# Patient Record
Sex: Male | Born: 1950 | Race: White | Hispanic: No | Marital: Married | Smoking: Current every day smoker
Health system: Southern US, Community
[De-identification: ages and names within clinical notes are randomized; demographics above are authoritative.]

## PROBLEM LIST (undated history)

## (undated) DIAGNOSIS — Z87442 Personal history of urinary calculi: Secondary | ICD-10-CM

## (undated) DIAGNOSIS — Z720 Tobacco use: Secondary | ICD-10-CM

## (undated) DIAGNOSIS — F32A Depression, unspecified: Secondary | ICD-10-CM

## (undated) DIAGNOSIS — J45909 Unspecified asthma, uncomplicated: Secondary | ICD-10-CM

## (undated) DIAGNOSIS — E1140 Type 2 diabetes mellitus with diabetic neuropathy, unspecified: Secondary | ICD-10-CM

## (undated) DIAGNOSIS — C801 Malignant (primary) neoplasm, unspecified: Secondary | ICD-10-CM

## (undated) DIAGNOSIS — I671 Cerebral aneurysm, nonruptured: Secondary | ICD-10-CM

## (undated) DIAGNOSIS — I10 Essential (primary) hypertension: Secondary | ICD-10-CM

## (undated) DIAGNOSIS — E119 Type 2 diabetes mellitus without complications: Secondary | ICD-10-CM

## (undated) DIAGNOSIS — R251 Tremor, unspecified: Secondary | ICD-10-CM

## (undated) DIAGNOSIS — F329 Major depressive disorder, single episode, unspecified: Secondary | ICD-10-CM

## (undated) HISTORY — DX: Type 2 diabetes mellitus with diabetic neuropathy, unspecified: E11.40

## (undated) HISTORY — PX: ABDOMINAL SURGERY: SHX537

## (undated) HISTORY — DX: Tremor, unspecified: R25.1

## (undated) HISTORY — DX: Essential (primary) hypertension: I10

## (undated) HISTORY — DX: Malignant (primary) neoplasm, unspecified: C80.1

## (undated) HISTORY — DX: Tobacco use: Z72.0

---
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
## 2005-07-18 ENCOUNTER — Emergency Department: Payer: Self-pay | Admitting: Emergency Medicine

## 2007-11-16 ENCOUNTER — Emergency Department: Payer: Self-pay | Admitting: Emergency Medicine

## 2011-10-15 ENCOUNTER — Emergency Department: Payer: Self-pay | Admitting: Emergency Medicine

## 2011-10-15 LAB — CBC
HCT: 42.9 % (ref 40.0–52.0)
MCH: 31 pg (ref 26.0–34.0)
MCHC: 34.2 g/dL (ref 32.0–36.0)
MCV: 90 fL (ref 80–100)
RBC: 4.75 10*6/uL (ref 4.40–5.90)
WBC: 8.2 10*3/uL (ref 3.8–10.6)

## 2013-01-27 ENCOUNTER — Ambulatory Visit: Payer: Self-pay | Admitting: Family Medicine

## 2013-04-10 ENCOUNTER — Ambulatory Visit: Payer: Self-pay | Admitting: Family Medicine

## 2015-03-31 ENCOUNTER — Emergency Department
Admission: EM | Admit: 2015-03-31 | Discharge: 2015-03-31 | Disposition: A | Discharge status: Other Acute Inpatient Hospital | Payer: No Typology Code available for payment source | Attending: Emergency Medicine | Admitting: Emergency Medicine

## 2015-03-31 ENCOUNTER — Emergency Department: Payer: No Typology Code available for payment source

## 2015-03-31 ENCOUNTER — Other Ambulatory Visit: Payer: Self-pay

## 2015-03-31 ENCOUNTER — Encounter: Payer: Self-pay | Admitting: *Deleted

## 2015-03-31 DIAGNOSIS — I6200 Nontraumatic subdural hemorrhage, unspecified: Secondary | ICD-10-CM | POA: Diagnosis not present

## 2015-03-31 DIAGNOSIS — Z720 Tobacco use: Secondary | ICD-10-CM | POA: Insufficient documentation

## 2015-03-31 DIAGNOSIS — S065X9A Traumatic subdural hemorrhage with loss of consciousness of unspecified duration, initial encounter: Secondary | ICD-10-CM

## 2015-03-31 DIAGNOSIS — Z880 Allergy status to penicillin: Secondary | ICD-10-CM | POA: Diagnosis not present

## 2015-03-31 DIAGNOSIS — E119 Type 2 diabetes mellitus without complications: Secondary | ICD-10-CM | POA: Insufficient documentation

## 2015-03-31 DIAGNOSIS — R51 Headache: Secondary | ICD-10-CM | POA: Diagnosis present

## 2015-03-31 DIAGNOSIS — S065XAA Traumatic subdural hemorrhage with loss of consciousness status unknown, initial encounter: Secondary | ICD-10-CM

## 2015-03-31 HISTORY — DX: Type 2 diabetes mellitus without complications: E11.9

## 2015-03-31 LAB — CBC
HEMATOCRIT: 41.3 % (ref 40.0–52.0)
HEMOGLOBIN: 14.4 g/dL (ref 13.0–18.0)
MCH: 29.5 pg (ref 26.0–34.0)
MCHC: 34.8 g/dL (ref 32.0–36.0)
MCV: 84.9 fL (ref 80.0–100.0)
Platelets: 215 10*3/uL (ref 150–440)
RBC: 4.87 MIL/uL (ref 4.40–5.90)
RDW: 13.7 % (ref 11.5–14.5)
WBC: 9.4 10*3/uL (ref 3.8–10.6)

## 2015-03-31 LAB — COMPREHENSIVE METABOLIC PANEL
ALBUMIN: 4.3 g/dL (ref 3.5–5.0)
ALK PHOS: 67 U/L (ref 38–126)
ALT: 12 U/L — AB (ref 17–63)
AST: 15 U/L (ref 15–41)
Anion gap: 8 (ref 5–15)
BILIRUBIN TOTAL: 1 mg/dL (ref 0.3–1.2)
BUN: 10 mg/dL (ref 6–20)
CALCIUM: 9.5 mg/dL (ref 8.9–10.3)
CO2: 27 mmol/L (ref 22–32)
CREATININE: 0.84 mg/dL (ref 0.61–1.24)
Chloride: 99 mmol/L — ABNORMAL LOW (ref 101–111)
GFR calc Af Amer: >60 mL/min (ref >60)
GLUCOSE: 179 mg/dL — AB (ref 65–99)
Potassium: 3.9 mmol/L (ref 3.5–5.1)
Sodium: 134 mmol/L — ABNORMAL LOW (ref 135–145)
TOTAL PROTEIN: 7.4 g/dL (ref 6.5–8.1)

## 2015-03-31 LAB — DIFFERENTIAL
Basophils Absolute: 0.1 10*3/uL (ref 0–0.1)
Basophils Relative: 1 %
EOS ABS: 0.4 10*3/uL (ref 0–0.7)
Eosinophils Relative: 4 %
LYMPHS ABS: 2.9 10*3/uL (ref 1.0–3.6)
LYMPHS PCT: 30 %
MONOS PCT: 8 %
Monocytes Absolute: 0.8 10*3/uL (ref 0.2–1.0)
NEUTROS ABS: 5.3 10*3/uL (ref 1.4–6.5)
NEUTROS PCT: 57 %

## 2015-03-31 LAB — TYPE AND SCREEN
ABO/RH(D): A POS
Antibody Screen: NEGATIVE

## 2015-03-31 LAB — PROTIME-INR
INR: 0.96
Prothrombin Time: 13 seconds (ref 11.4–15.0)

## 2015-03-31 LAB — APTT: APTT: 31 s (ref 24–36)

## 2015-03-31 MED ORDER — ACETAMINOPHEN 325 MG PO TABS
650.0000 mg | ORAL_TABLET | Freq: Once | ORAL | Status: AC
  Administered 2015-03-31: 650 mg via ORAL

## 2015-03-31 MED ORDER — ACETAMINOPHEN 325 MG PO TABS
ORAL_TABLET | ORAL | Status: AC
  Filled 2015-03-31: qty 2

## 2015-03-31 NOTE — ED Provider Notes (Addendum)
[facility] Emergency Department Provider Note  ____________________________________________  Time seen: Approximately 10:04 PM  I have reviewed the triage vital signs and the nursing notes.   HISTORY  Chief Complaint Headache    HPI Kevin Mckay is a 64 y.o. male patient reports 5-6 weeks of headache especially left side. Left-sided numbness and left-sided face and arm. Patient reports the headache got much worse last night or this morning. His left arm became somewhat weak and clumsy at that time. Initially understood that the arm was weak and clumsy for some time but patient corrected that history patient was treated for sinus infection twice early on in the course of this illness. At that time only had was a green nasal discharge and the headache. The headache is severe. It is achy. Nothing seems to make it better or worse.   Past Medical History  Diagnosis Date  . Diabetes mellitus without complication ([place])     There are no active problems to display for this patient.   Past Surgical History  Procedure Laterality Date  . Abdominal surgery      No current outpatient prescriptions on file.  Allergies Amoxicillin and Codeine  No family history on file.  Social History Social History  Substance Use Topics  . Smoking status: Current Every Day Smoker  . Smokeless tobacco: None  . Alcohol Use: No    Review of Systems Constitutional: No fever/chills Eyes: No visual changes. ENT: No sore throat. Cardiovascular: Denies chest pain. Respiratory: Denies shortness of breath. Gastrointestinal: No abdominal pain.  No nausea, no vomiting.  No diarrhea.  No constipation. Genitourinary: Negative for dysuria. Musculoskeletal: Negative for back pain. Skin: Negative for rash. Neurological: See history of present illness  10-point ROS otherwise negative.  ____________________________________________   PHYSICAL EXAM:  VITAL SIGNS: ED Triage  Vitals  Enc Vitals Group     BP 03/31/15 2024 171/76 mmHg     Pulse Rate 03/31/15 2024 68     Resp 03/31/15 2024 18     Temp 03/31/15 2024 98.1 F (36.7 C)     Temp Source 03/31/15 2024 Oral     SpO2 03/31/15 2024 92 %     Weight 03/31/15 2024 146 lb (66.225 kg)     Height 03/31/15 2024 '5\' 8"'$  (1.727 m)     Head Cir --      Peak Flow --      Pain Score 03/31/15 2024 10     Pain Loc --      Pain Edu? --      Excl. in [place]? --     Constitutional: Alert and oriented. Well appearing and in no acute distress. Eyes: Conjunctivae are normal. PERRL. EOMI. fundi look normal Head: Atraumatic. Nose: No congestion/rhinnorhea. Mouth/Throat: Mucous membranes are moist.  Oropharynx non-erythematous. Neck: No stridor Cardiovascular: Normal rate, regular rhythm. Grossly normal heart sounds.  Good peripheral circulation. Respiratory: Normal respiratory effort.  No retractions. Lungs CTAB. Gastrointestinal: Soft and nontender. No distention. No abdominal bruits. No CVA tenderness. Musculoskeletal: No lower extremity tenderness nor edema.  No joint effusions. Neurologic:  Normal speech and language. No nerves II through XII are intact. Patient does report some left-sided facial numbness. Patient had motor strength is 5 over 5 throughout except for the left arm where I can overcome him I cannot overcome him on the right side. His left arm is also ataxic when finger to nose and rapid alternating movements are tested. Skin:  Skin is warm, dry and intact.  No rash noted. Psychiatric: Mood and affect are normal. Speech and behavior are normal.  ____________________________________________   LABS (all labs ordered are listed, but only abnormal results are displayed)  Labs Reviewed  COMPREHENSIVE METABOLIC PANEL - Abnormal; Notable for the following:    Sodium 134 (*)    Chloride 99 (*)    Glucose, Bld 179 (*)    ALT 12 (*)    All other components within normal limits  CBC  DIFFERENTIAL  APTT   PROTIME-INR  TYPE AND SCREEN   ____________________________________________  EKG   ____________________________________________  RADIOLOGY  CT scan read by radiology shows 10.5 mm right-sided subdural with 2 mm of midline shift. 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 Critical care time 45 minutes. This is due mostly to the fact that the ER are no ICU beds here there are no ICU beds at [facility] although they're to surgical ICU beds and they are being held for other things. There are no ICU beds at [facility] and do had no ICU beds but do probably managed open up to and I had discussed the patient with both Dr. Amedeo Plenty the neurosurgeon and the nurse practitioner for Dr. Leory Plowman B IUD any Y.  ____________________________________________   FINAL CLINICAL IMPRESSION(S) / ED DIAGNOSES  Final diagnoses:  Subdural hematoma ([place])      Nena Polio, MD 03/31/15 2213 EKG read and interpreted by me shows normal sinus rhythm at a rate of 64 normal axis essentially normal EKG  Nena Polio, MD 03/31/15 2235

## 2015-03-31 NOTE — ED Notes (Signed)
Pt c/o headache x 5 weeks.  He reports that he has been treated for sinus infection.   He also has had some left hand numbness and weakess that he attributed to his diabetic neuropathy.  Presented to ED with severe headache and significantly worse motor function of left arm.

## 2015-03-31 NOTE — ED Notes (Signed)
Pt states headache, difficulty grasping with left hand,  and tingling in the left arm down through the hands/ left side of the face for 5 weeks, headaches are worse today. No new numbness today.

## 2015-04-01 DIAGNOSIS — E1140 Type 2 diabetes mellitus with diabetic neuropathy, unspecified: Secondary | ICD-10-CM | POA: Insufficient documentation

## 2015-04-01 DIAGNOSIS — S065X9A Traumatic subdural hemorrhage with loss of consciousness of unspecified duration, initial encounter: Secondary | ICD-10-CM | POA: Insufficient documentation

## 2015-04-01 DIAGNOSIS — Z720 Tobacco use: Secondary | ICD-10-CM | POA: Insufficient documentation

## 2015-04-01 DIAGNOSIS — I10 Essential (primary) hypertension: Secondary | ICD-10-CM | POA: Insufficient documentation

## 2015-04-01 DIAGNOSIS — S065XAA Traumatic subdural hemorrhage with loss of consciousness status unknown, initial encounter: Secondary | ICD-10-CM | POA: Insufficient documentation

## 2015-04-01 LAB — ABO/RH: ABO/RH(D): A POS

## 2015-05-11 ENCOUNTER — Other Ambulatory Visit: Payer: Self-pay | Admitting: Neurology

## 2015-05-11 DIAGNOSIS — S065X9A Traumatic subdural hemorrhage with loss of consciousness of unspecified duration, initial encounter: Secondary | ICD-10-CM

## 2015-05-11 DIAGNOSIS — S065XAA Traumatic subdural hemorrhage with loss of consciousness status unknown, initial encounter: Secondary | ICD-10-CM

## 2015-06-06 ENCOUNTER — Ambulatory Visit
Admission: RE | Admit: 2015-06-06 | Discharge: 2015-06-06 | Disposition: A | Discharge status: Home / Self Care | Payer: No Typology Code available for payment source | Source: Ambulatory Visit | Attending: Neurology | Admitting: Neurology

## 2015-06-06 DIAGNOSIS — S065XAA Traumatic subdural hemorrhage with loss of consciousness status unknown, initial encounter: Secondary | ICD-10-CM

## 2015-06-06 DIAGNOSIS — I6200 Nontraumatic subdural hemorrhage, unspecified: Secondary | ICD-10-CM | POA: Diagnosis not present

## 2015-06-06 DIAGNOSIS — S065X9A Traumatic subdural hemorrhage with loss of consciousness of unspecified duration, initial encounter: Secondary | ICD-10-CM

## 2015-06-15 ENCOUNTER — Other Ambulatory Visit: Payer: Self-pay | Admitting: Neurology

## 2015-06-15 DIAGNOSIS — R9389 Abnormal findings on diagnostic imaging of other specified body structures: Secondary | ICD-10-CM

## 2015-06-15 DIAGNOSIS — S065XAA Traumatic subdural hemorrhage with loss of consciousness status unknown, initial encounter: Secondary | ICD-10-CM

## 2015-06-15 DIAGNOSIS — S065X9A Traumatic subdural hemorrhage with loss of consciousness of unspecified duration, initial encounter: Secondary | ICD-10-CM

## 2015-06-21 ENCOUNTER — Other Ambulatory Visit: Payer: Self-pay | Admitting: Neurology

## 2015-06-21 DIAGNOSIS — R9389 Abnormal findings on diagnostic imaging of other specified body structures: Secondary | ICD-10-CM

## 2015-06-21 DIAGNOSIS — S065X9A Traumatic subdural hemorrhage with loss of consciousness of unspecified duration, initial encounter: Secondary | ICD-10-CM

## 2015-06-21 DIAGNOSIS — S065XAA Traumatic subdural hemorrhage with loss of consciousness status unknown, initial encounter: Secondary | ICD-10-CM

## 2015-06-26 ENCOUNTER — Other Ambulatory Visit: Payer: No Typology Code available for payment source

## 2015-06-30 ENCOUNTER — Inpatient Hospital Stay: Admission: RE | Admit: 2015-06-30 | Payer: No Typology Code available for payment source | Source: Ambulatory Visit

## 2015-08-07 ENCOUNTER — Ambulatory Visit
Admission: RE | Admit: 2015-08-07 | Discharge: 2015-08-07 | Disposition: A | Discharge status: Home / Self Care | Payer: BLUE CROSS/BLUE SHIELD | Source: Ambulatory Visit | Attending: Neurology | Admitting: Neurology

## 2015-08-07 DIAGNOSIS — S065XAA Traumatic subdural hemorrhage with loss of consciousness status unknown, initial encounter: Secondary | ICD-10-CM

## 2015-08-07 DIAGNOSIS — S065X9A Traumatic subdural hemorrhage with loss of consciousness of unspecified duration, initial encounter: Secondary | ICD-10-CM

## 2015-08-07 DIAGNOSIS — R9389 Abnormal findings on diagnostic imaging of other specified body structures: Secondary | ICD-10-CM

## 2015-08-11 DIAGNOSIS — R9089 Other abnormal findings on diagnostic imaging of central nervous system: Secondary | ICD-10-CM | POA: Insufficient documentation

## 2016-03-19 ENCOUNTER — Emergency Department: Payer: BLUE CROSS/BLUE SHIELD

## 2016-03-19 ENCOUNTER — Encounter: Payer: Self-pay | Admitting: Emergency Medicine

## 2016-03-19 ENCOUNTER — Emergency Department
Admission: EM | Admit: 2016-03-19 | Discharge: 2016-03-19 | Disposition: A | Discharge status: Home / Self Care | Payer: BLUE CROSS/BLUE SHIELD | Attending: Emergency Medicine | Admitting: Emergency Medicine

## 2016-03-19 DIAGNOSIS — E119 Type 2 diabetes mellitus without complications: Secondary | ICD-10-CM | POA: Diagnosis not present

## 2016-03-19 DIAGNOSIS — Y92410 Unspecified street and highway as the place of occurrence of the external cause: Secondary | ICD-10-CM | POA: Insufficient documentation

## 2016-03-19 DIAGNOSIS — F17200 Nicotine dependence, unspecified, uncomplicated: Secondary | ICD-10-CM | POA: Diagnosis not present

## 2016-03-19 DIAGNOSIS — S0990XA Unspecified injury of head, initial encounter: Secondary | ICD-10-CM | POA: Diagnosis not present

## 2016-03-19 DIAGNOSIS — Z7984 Long term (current) use of oral hypoglycemic drugs: Secondary | ICD-10-CM | POA: Diagnosis not present

## 2016-03-19 DIAGNOSIS — Y9389 Activity, other specified: Secondary | ICD-10-CM | POA: Diagnosis not present

## 2016-03-19 DIAGNOSIS — Z79899 Other long term (current) drug therapy: Secondary | ICD-10-CM | POA: Diagnosis not present

## 2016-03-19 DIAGNOSIS — Z7982 Long term (current) use of aspirin: Secondary | ICD-10-CM | POA: Diagnosis not present

## 2016-03-19 DIAGNOSIS — R51 Headache: Secondary | ICD-10-CM | POA: Diagnosis present

## 2016-03-19 DIAGNOSIS — Y999 Unspecified external cause status: Secondary | ICD-10-CM | POA: Diagnosis not present

## 2016-03-19 HISTORY — DX: Cerebral aneurysm, nonruptured: I67.1
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
## 2016-03-19 NOTE — ED Notes (Signed)
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
## 2016-05-10 ENCOUNTER — Other Ambulatory Visit (HOSPITAL_COMMUNITY): Payer: Self-pay | Admitting: Neurosurgery

## 2016-05-10 DIAGNOSIS — I671 Cerebral aneurysm, nonruptured: Secondary | ICD-10-CM

## 2016-05-11 ENCOUNTER — Other Ambulatory Visit: Payer: Self-pay | Admitting: Neurosurgery

## 2016-05-31 ENCOUNTER — Ambulatory Visit (HOSPITAL_COMMUNITY)
Admission: RE | Admit: 2016-05-31 | Discharge: 2016-05-31 | Disposition: A | Discharge status: Home / Self Care | Payer: BLUE CROSS/BLUE SHIELD | Source: Ambulatory Visit | Attending: Neurosurgery | Admitting: Neurosurgery

## 2016-05-31 ENCOUNTER — Other Ambulatory Visit (HOSPITAL_COMMUNITY): Payer: Self-pay | Admitting: Neurosurgery

## 2016-05-31 ENCOUNTER — Encounter (HOSPITAL_COMMUNITY): Payer: Self-pay | Admitting: Neurosurgery

## 2016-05-31 DIAGNOSIS — Z79899 Other long term (current) drug therapy: Secondary | ICD-10-CM | POA: Insufficient documentation

## 2016-05-31 DIAGNOSIS — Z880 Allergy status to penicillin: Secondary | ICD-10-CM | POA: Diagnosis not present

## 2016-05-31 DIAGNOSIS — I10 Essential (primary) hypertension: Secondary | ICD-10-CM | POA: Insufficient documentation

## 2016-05-31 DIAGNOSIS — Z09 Encounter for follow-up examination after completed treatment for conditions other than malignant neoplasm: Secondary | ICD-10-CM | POA: Insufficient documentation

## 2016-05-31 DIAGNOSIS — F17200 Nicotine dependence, unspecified, uncomplicated: Secondary | ICD-10-CM | POA: Diagnosis not present

## 2016-05-31 DIAGNOSIS — Z7984 Long term (current) use of oral hypoglycemic drugs: Secondary | ICD-10-CM | POA: Diagnosis not present

## 2016-05-31 DIAGNOSIS — I671 Cerebral aneurysm, nonruptured: Secondary | ICD-10-CM

## 2016-05-31 DIAGNOSIS — Z8679 Personal history of other diseases of the circulatory system: Secondary | ICD-10-CM | POA: Diagnosis not present

## 2016-05-31 DIAGNOSIS — E119 Type 2 diabetes mellitus without complications: Secondary | ICD-10-CM | POA: Diagnosis not present

## 2016-05-31 HISTORY — PX: IR GENERIC HISTORICAL: IMG1180011

## 2016-05-31 LAB — CBC WITH DIFFERENTIAL/PLATELET
BASOS ABS: 0.1 10*3/uL (ref 0.0–0.1)
Basophils Relative: 1 %
EOS PCT: 5 %
Eosinophils Absolute: 0.5 10*3/uL (ref 0.0–0.7)
HCT: 39.8 % (ref 39.0–52.0)
HEMOGLOBIN: 14.1 g/dL (ref 13.0–17.0)
LYMPHS PCT: 27 %
Lymphs Abs: 2.5 10*3/uL (ref 0.7–4.0)
MCH: 29.6 pg (ref 26.0–34.0)
MCHC: 35.4 g/dL (ref 30.0–36.0)
MCV: 83.6 fL (ref 78.0–100.0)
Monocytes Absolute: 0.7 10*3/uL (ref 0.1–1.0)
Monocytes Relative: 8 %
NEUTROS ABS: 5.6 10*3/uL (ref 1.7–7.7)
NEUTROS PCT: 59 %
PLATELETS: 176 10*3/uL (ref 150–400)
RBC: 4.76 MIL/uL (ref 4.22–5.81)
RDW: 12.8 % (ref 11.5–15.5)
WBC: 9.3 10*3/uL (ref 4.0–10.5)

## 2016-05-31 LAB — BASIC METABOLIC PANEL
ANION GAP: 9 (ref 5–15)
BUN: 15 mg/dL (ref 6–20)
CHLORIDE: 102 mmol/L (ref 101–111)
CO2: 26 mmol/L (ref 22–32)
Calcium: 9.4 mg/dL (ref 8.9–10.3)
Creatinine, Ser: 0.79 mg/dL (ref 0.61–1.24)
Glucose, Bld: 231 mg/dL — ABNORMAL HIGH (ref 65–99)
POTASSIUM: 4.1 mmol/L (ref 3.5–5.1)
SODIUM: 137 mmol/L (ref 135–145)

## 2016-05-31 LAB — APTT: APTT: 29 s (ref 24–36)

## 2016-05-31 LAB — PROTIME-INR
INR: 0.93
Prothrombin Time: 12.4 seconds (ref 11.4–15.2)

## 2016-05-31 LAB — GLUCOSE, CAPILLARY: GLUCOSE-CAPILLARY: 228 mg/dL — AB (ref 65–99)

## 2016-05-31 MED ORDER — MIDAZOLAM HCL 2 MG/2ML IJ SOLN
INTRAMUSCULAR | Status: DC | PRN
  Administered 2016-05-31: 1 mg via INTRAVENOUS

## 2016-05-31 MED ORDER — MIDAZOLAM HCL 2 MG/2ML IJ SOLN
INTRAMUSCULAR | Status: AC
  Filled 2016-05-31: qty 2

## 2016-05-31 MED ORDER — CHLORHEXIDINE GLUCONATE CLOTH 2 % EX PADS: 6.0000 | MEDICATED_PAD | Freq: Once | CUTANEOUS | Status: DC

## 2016-05-31 MED ORDER — HEPARIN SODIUM (PORCINE) 1000 UNIT/ML IJ SOLN
INTRAMUSCULAR | Status: DC | PRN
  Administered 2016-05-31: 2000 [IU] via INTRAVENOUS

## 2016-05-31 MED ORDER — FENTANYL CITRATE (PF) 100 MCG/2ML IJ SOLN
INTRAMUSCULAR | Status: AC
  Filled 2016-05-31: qty 2

## 2016-05-31 MED ORDER — LIDOCAINE HCL 1 % IJ SOLN
INTRAMUSCULAR | Status: AC
  Filled 2016-05-31: qty 20

## 2016-05-31 MED ORDER — SODIUM CHLORIDE 0.9 % IV SOLN
INTRAVENOUS | Status: DC
  Administered 2016-05-31: 12:00:00 via INTRAVENOUS

## 2016-05-31 MED ORDER — HYDROCODONE-ACETAMINOPHEN 5-325 MG PO TABS: 1.0000 | ORAL_TABLET | ORAL | Status: DC | PRN

## 2016-05-31 MED ORDER — IOPAMIDOL (ISOVUE-300) INJECTION 61%
INTRAVENOUS | Status: AC
  Administered 2016-05-31: 50 mL
  Filled 2016-05-31: qty 100

## 2016-05-31 MED ORDER — FENTANYL CITRATE (PF) 100 MCG/2ML IJ SOLN
INTRAMUSCULAR | Status: DC | PRN
  Administered 2016-05-31: 25 ug via INTRAVENOUS

## 2016-05-31 MED ORDER — IOPAMIDOL (ISOVUE-300) INJECTION 61%
INTRAVENOUS | Status: AC
  Filled 2016-05-31: qty 100

## 2016-05-31 MED ORDER — HEPARIN SODIUM (PORCINE) 1000 UNIT/ML IJ SOLN
INTRAMUSCULAR | Status: AC
  Filled 2016-05-31: qty 2

## 2016-05-31 MED ORDER — LIDOCAINE HCL 1 % IJ SOLN
INTRAMUSCULAR | Status: DC | PRN
  Administered 2016-05-31: 10 mL

## 2016-05-31 NOTE — Discharge Instructions (Signed)
Angiogram, Care After °These instructions give you information about caring for yourself after your procedure. Your doctor may also give you more specific instructions. Call your doctor if you have any problems or questions after your procedure. °Follow these instructions at home: °· Take medicines only as told by your doctor. °· Follow your doctor's instructions about: °¨ Care of the area where the tube was inserted. °¨ Bandage (dressing) changes and removal. °· You may shower 24-48 hours after the procedure or as told by your doctor. °· Do not take baths, swim, or use a hot tub until your doctor approves. °· Every day, check the area where the tube was inserted. Watch for: °¨ Redness, swelling, or pain. °¨ Fluid, blood, or pus. °· Do not apply powder or lotion to the site. °· Do not lift anything that is heavier than 10 lb (4.5 kg) for 5 days or as told by your doctor. °· Ask your doctor when you can: °¨ Return to work or school. °¨ Do physical activities or play sports. °¨ Have sex. °· Do not drive or operate heavy machinery for 24 hours or as told by your doctor. °· Have someone with you for the first 24 hours after the procedure. °· Keep all follow-up visits as told by your doctor. This is important. °Contact a health care provider if: °· You have a fever. °· You have chills. °· You have more bleeding from the area where the tube was inserted. Hold pressure on the area. °· You have redness, swelling, or pain in the area where the tube was inserted. °· You have fluid or pus coming from the area. °Get help right away if: °· You have a lot of pain in the area where the tube was inserted. °· The area where the tube was inserted is bleeding, and the bleeding does not stop after 30 minutes of holding steady pressure on the area. °· The area near or just beyond the insertion site becomes pale, cool, tingly, or numb. °This information is not intended to replace advice given to you by your health care provider. Make  sure you discuss any questions you have with your health care provider. °Document Released: 09/07/2008 Document Revised: 11/17/2015 Document Reviewed: 11/12/2012 °Elsevier Interactive Patient Education © 2017 [employer] Inc[employer] ° °

## 2016-05-31 NOTE — Sedation Documentation (Signed)
Patient is resting comfortably. 

## 2016-05-31 NOTE — H&P (Signed)
CC:  No chief complaint on file.   HPI: Kevin Mckay is a 65 y.o. male with a history of subdural hematoma and subsequent incidental discovery of a possible left MCA aneurysm on MRA. He does have a history of tobacco smoking and hypertension. No FHx of aneurysms. He presents today for further workup with diagnostic cerebral angiogram.  PMH: Past Medical History:  Diagnosis Date  . Cerebral aneurysm   . Diabetes mellitus without complication (HCC)     PSH: Past Surgical History:  Procedure Laterality Date  . ABDOMINAL SURGERY      SH: Social History  Substance Use Topics  . Smoking status: Current Every Day Smoker  . Smokeless tobacco: Never Used  . Alcohol use No    MEDS: Prior to Admission medications   Medication Sig Start Date End Date Taking? Authorizing Provider  amLODipine (NORVASC) 10 MG tablet Take 10 mg by mouth daily.   Yes Historical Provider, MD  Artificial Tear Ointment (REFRESH P.M. OP) Place 1 application into the right eye at bedtime.   Yes Historical Provider, MD  butalbital-acetaminophen-caffeine (FIORICET, ESGIC) 50-325-40 MG tablet Take 1-2 tablets by mouth every 6 (six) hours as needed.    Yes Historical Provider, MD  DULoxetine (CYMBALTA) 30 MG capsule Take 30 mg by mouth daily.   Yes Historical Provider, MD  gabapentin (NEURONTIN) 600 MG tablet Take 600 mg by mouth every 6 (six) hours as needed (pain. Not to exceed 3600 mg).    Yes Historical Provider, MD  glipiZIDE (GLUCOTROL XL) 10 MG 24 hr tablet Take 10 mg by mouth daily with breakfast.   Yes Historical Provider, MD  lovastatin (MEVACOR) 20 MG tablet Take 20 mg by mouth every evening.   Yes Historical Provider, MD  metFORMIN (GLUCOPHAGE) 1000 MG tablet Take 1,000 mg by mouth 2 (two) times daily with a meal.   Yes Historical Provider, MD  nortriptyline (PAMELOR) 10 MG capsule Take 10 mg by mouth at bedtime.   Yes Historical Provider, MD  Oxycodone HCl 10 MG TABS Take 10 mg by mouth 2 (two) times  daily as needed.   Yes Historical Provider, MD    ALLERGY: Allergies  Allergen Reactions  . Amoxicillin Hives  . Codeine     ROS: ROS  NEUROLOGIC EXAM: Awake, alert, oriented Memory and concentration grossly intact Speech fluent, appropriate CN grossly intact Motor exam: Upper Extremities Deltoid Bicep Tricep Grip  Right 5/5 5/5 5/5 5/5  Left 5/5 5/5 5/5 5/5   Lower Extremity IP Quad PF DF EHL  Right 5/5 5/5 5/5 5/5 5/5  Left 5/5 5/5 5/5 5/5 5/5   Sensation grossly intact to LT  [facility]: MRI/MRA reviewed which demonstrates a possible left MCA trifurcation aneurysm. T2 image seem to indicate a larger area of susceptibility which may indicate partially thrombosed aneurysm.  IMPRESSION: - 65 y.o. male with possible left MCA aneurysm discovered incidentally.   PLAN: - Proceed with diagnostic cerebral angiogram  I have reviewed the risks, benefits, alternatives with the patient in the office. All questions were answered.

## 2018-08-28 DIAGNOSIS — R251 Tremor, unspecified: Secondary | ICD-10-CM | POA: Insufficient documentation

## 2018-08-28 DIAGNOSIS — G629 Polyneuropathy, unspecified: Secondary | ICD-10-CM | POA: Insufficient documentation

## 2018-09-04 ENCOUNTER — Ambulatory Visit
Admission: RE | Admit: 2018-09-04 | Discharge: 2018-09-04 | Disposition: A | Discharge status: Home / Self Care | Payer: BLUE CROSS/BLUE SHIELD | Attending: Family | Admitting: Family

## 2018-09-04 ENCOUNTER — Other Ambulatory Visit: Payer: Self-pay

## 2018-09-04 ENCOUNTER — Ambulatory Visit
Admission: RE | Admit: 2018-09-04 | Discharge: 2018-09-04 | Disposition: A | Discharge status: Home / Self Care | Payer: BLUE CROSS/BLUE SHIELD | Source: Ambulatory Visit | Attending: Family | Admitting: Family

## 2018-09-04 ENCOUNTER — Other Ambulatory Visit: Payer: Self-pay | Admitting: Family

## 2018-09-04 DIAGNOSIS — J189 Pneumonia, unspecified organism: Secondary | ICD-10-CM | POA: Diagnosis present

## 2018-10-31 ENCOUNTER — Telehealth: Payer: Self-pay | Admitting: *Deleted

## 2018-10-31 NOTE — Telephone Encounter (Signed)
Left voicemail about lung screening scan being scheduled as soon as current restrictions allow.

## 2018-11-20 ENCOUNTER — Telehealth: Payer: Self-pay | Admitting: *Deleted

## 2018-11-20 DIAGNOSIS — Z87891 Personal history of nicotine dependence: Secondary | ICD-10-CM

## 2018-11-20 DIAGNOSIS — Z122 Encounter for screening for malignant neoplasm of respiratory organs: Secondary | ICD-10-CM

## 2018-11-20 NOTE — Telephone Encounter (Signed)
Received referral for low dose lung cancer screening CT scan. Message left at phone number listed in EMR for patient to call me back to facilitate scheduling scan.  

## 2018-11-20 NOTE — Telephone Encounter (Signed)
Received referral for initial lung cancer screening scan. Contacted patient and obtained smoking history,(current, 75 pack year) as well as answering questions related to screening process. Patient denies signs of lung cancer such as weight loss or hemoptysis. Patient denies comorbidity that would prevent curative treatment if lung cancer were found. Patient is scheduled for shared decision making visit and CT scan on 11/25/18 at 215pm.

## 2018-11-25 ENCOUNTER — Other Ambulatory Visit: Payer: Self-pay

## 2018-11-25 ENCOUNTER — Inpatient Hospital Stay: Payer: BC Managed Care – PPO | Attending: Oncology | Admitting: Oncology

## 2018-11-25 ENCOUNTER — Encounter: Payer: Self-pay | Admitting: Oncology

## 2018-11-25 ENCOUNTER — Ambulatory Visit
Admission: RE | Admit: 2018-11-25 | Discharge: 2018-11-25 | Disposition: A | Discharge status: Home / Self Care | Payer: BLUE CROSS/BLUE SHIELD | Source: Ambulatory Visit | Attending: Nurse Practitioner | Admitting: Nurse Practitioner

## 2018-11-25 DIAGNOSIS — Z122 Encounter for screening for malignant neoplasm of respiratory organs: Secondary | ICD-10-CM | POA: Diagnosis present

## 2018-11-25 DIAGNOSIS — Z87891 Personal history of nicotine dependence: Secondary | ICD-10-CM

## 2018-11-25 NOTE — Progress Notes (Signed)
Virtual Visit via Video Note  I connected with Adden Wieman on 11/25/18 at  2:15 PM EDT by a video enabled telemedicine application and verified that I am speaking with the correct person using two identifiers.  Location: Patient: [facility] Provider: Home   I discussed the limitations of evaluation and management by telemedicine and the availability of in person appointments. The patient expressed understanding and agreed to proceed.  I discussed the assessment and treatment plan with the patient. The patient was provided an opportunity to ask questions and all were answered. The patient agreed with the plan and demonstrated an understanding of the instructions.   The patient was advised to call back or seek an in-person evaluation if the symptoms worsen or if the condition fails to improve as anticipated.   In accordance with CMS guidelines, patient has met eligibility criteria including age, absence of signs or symptoms of lung cancer.  Social History   Tobacco Use  . Smoking status: Current Every Day Smoker    Packs/day: 1.50    Years: 50.00    Pack years: 75.00    Types: Cigarettes  . Smokeless tobacco: Never Used  Substance Use Topics  . Alcohol use: No  . Drug use: No      A shared decision-making session was conducted prior to the performance of CT scan. This includes one or more decision aids, includes benefits and harms of screening, follow-up diagnostic testing, over-diagnosis, false positive rate, and total radiation exposure.   Counseling on the importance of adherence to annual lung cancer LDCT screening, impact of co-morbidities, and ability or willingness to undergo diagnosis and treatment is imperative for compliance of the program.   Counseling on the importance of continued smoking cessation for former smokers; the importance of smoking cessation for current smokers, and information about tobacco cessation interventions have been given to patient including [place] and [employer] quit [place] programs.   Written order for lung cancer screening with LDCT has been given to the patient and any and all questions have been answered to the best of my abilities.    Yearly follow up will be coordinated by Burgess Estelle, Thoracic Navigator.  I provided 10 minutes of face-to-face video visit time during this encounter, and > 50% was spent counseling as documented under my assessment & plan.   Jacquelin Hawking, NP

## 2018-11-28 ENCOUNTER — Telehealth: Payer: Self-pay | Admitting: *Deleted

## 2018-11-28 NOTE — Telephone Encounter (Signed)
Notified patient of LDCT lung cancer screening program results with recommendation for 12 month follow up imaging. Also notified of incidental findings noted below and is encouraged to discuss further with PCP who will receive a copy of this note and/or the CT report. Encouraged patient to discuss enlarged node with his pulmonologist. Patient verbalizes understanding.   IMPRESSION: 1. Lung-RADS 2, benign appearance or behavior. Continue annual screening with low-dose chest CT without contrast in 12 months. 2. Enlarged AP window lymph node. Malignancy cannot be excluded. In the absence of prior imaging for comparison, PET could be performed in further evaluation, as clinically indicated. These results will be called to the ordering clinician or representative by the Radiologist Assistant, and communication documented in the PACS or zVision Dashboard. 3. Aortic atherosclerosis (ICD10-170.0). Coronary artery calcification. 4. Enlarged pulmonic trunk, indicative of pulmonary arterial hypertension.

## 2018-11-28 NOTE — Telephone Encounter (Signed)
Voicemail left in attempt to review lung screening results. Will forward to Dr. Lanney Gins as referring provider to consider appropriate follow up of enlarged node.

## 2018-12-15 ENCOUNTER — Telehealth: Payer: Self-pay | Admitting: *Deleted

## 2018-12-15 NOTE — Telephone Encounter (Signed)
After discussion with pulmonologist with recommendation for PET scan to follow enlarged node seen on lung screening scan, patient contacted and discussed plan of care. Patient is agreeable but ask to call back in 1 to 2 days to schedule PET scan.

## 2018-12-18 ENCOUNTER — Telehealth: Payer: Self-pay | Admitting: *Deleted

## 2018-12-18 NOTE — Telephone Encounter (Signed)
Contacted patient regarding scheduling the PET scan that is recommended. Patient says he will call back in 2 days to schedule.

## 2018-12-22 ENCOUNTER — Telehealth: Payer: Self-pay | Admitting: *Deleted

## 2018-12-22 DIAGNOSIS — R590 Localized enlarged lymph nodes: Secondary | ICD-10-CM

## 2018-12-22 NOTE — Telephone Encounter (Signed)
Patient returned called attempts to schedule PET scan. He is agreeable to scheduling this week. Will obtain appt and call patient back.

## 2018-12-25 ENCOUNTER — Other Ambulatory Visit: Payer: Self-pay

## 2018-12-25 ENCOUNTER — Ambulatory Visit
Admission: RE | Admit: 2018-12-25 | Discharge: 2018-12-25 | Disposition: A | Discharge status: Home / Self Care | Payer: BC Managed Care – PPO | Source: Ambulatory Visit | Attending: Nurse Practitioner | Admitting: Nurse Practitioner

## 2018-12-25 DIAGNOSIS — J329 Chronic sinusitis, unspecified: Secondary | ICD-10-CM | POA: Insufficient documentation

## 2018-12-25 DIAGNOSIS — J439 Emphysema, unspecified: Secondary | ICD-10-CM | POA: Insufficient documentation

## 2018-12-25 DIAGNOSIS — I2510 Atherosclerotic heart disease of native coronary artery without angina pectoris: Secondary | ICD-10-CM | POA: Diagnosis not present

## 2018-12-25 DIAGNOSIS — E119 Type 2 diabetes mellitus without complications: Secondary | ICD-10-CM | POA: Diagnosis not present

## 2018-12-25 DIAGNOSIS — F17210 Nicotine dependence, cigarettes, uncomplicated: Secondary | ICD-10-CM | POA: Insufficient documentation

## 2018-12-25 DIAGNOSIS — R590 Localized enlarged lymph nodes: Secondary | ICD-10-CM | POA: Diagnosis not present

## 2018-12-25 LAB — GLUCOSE, CAPILLARY: Glucose-Capillary: 121 mg/dL — ABNORMAL HIGH (ref 70–99)

## 2018-12-25 MED ORDER — FLUDEOXYGLUCOSE F - 18 (FDG) INJECTION
7.5000 | Freq: Once | INTRAVENOUS | Status: AC | PRN
  Administered 2018-12-25: 7.73 via INTRAVENOUS

## 2018-12-30 ENCOUNTER — Telehealth: Payer: Self-pay | Admitting: *Deleted

## 2018-12-30 DIAGNOSIS — R918 Other nonspecific abnormal finding of lung field: Secondary | ICD-10-CM

## 2018-12-30 NOTE — Telephone Encounter (Signed)
Left voicemail in attempt to contact patient and review PET results and recommended next steps.

## 2018-12-30 NOTE — Telephone Encounter (Signed)
Patient is given appt for this Friday to see Dr. Genevive Bi at 9:15am.

## 2018-12-30 NOTE — Telephone Encounter (Signed)
Contacted and reviewed results of PET scan and recommendation for referral to thoracic surgery. Patient is in agreement with this plan.

## 2019-01-02 ENCOUNTER — Ambulatory Visit: Payer: Self-pay | Admitting: Cardiothoracic Surgery

## 2019-01-05 ENCOUNTER — Other Ambulatory Visit: Payer: Self-pay

## 2019-01-05 ENCOUNTER — Encounter: Payer: Self-pay | Admitting: Cardiothoracic Surgery

## 2019-01-05 ENCOUNTER — Ambulatory Visit (INDEPENDENT_AMBULATORY_CARE_PROVIDER_SITE_OTHER): Payer: BC Managed Care – PPO | Admitting: Cardiothoracic Surgery

## 2019-01-05 VITALS — BP 131/88 | HR 71 | Temp 97.2°F | Ht 68.0 in | Wt 148.0 lb

## 2019-01-05 DIAGNOSIS — M7989 Other specified soft tissue disorders: Secondary | ICD-10-CM | POA: Diagnosis not present

## 2019-01-05 NOTE — Patient Instructions (Signed)
Please try to STOP smoking.  We will send the referral to Oncology  Dr.Brahmanday office and someone form their office will call you to schedule an appointment within 5-7 days.

## 2019-01-05 NOTE — Progress Notes (Signed)
Patient ID: Kevin Mckay, male   DOB: 1951/01/07, 68 y.o.   MRN: [id number]  Chief Complaint  Patient presents with  . Other    lung mass    Referred By Magdalene Molly Reason for Referral mediastinal mass  HPI Location, Quality, Duration, Severity, Timing, Context, Modifying Factors, Associated Signs and Symptoms.  Kevin Mckay is a 68 y.o. male.  This patient is a 68 year old male with a longstanding smoking history underwent a CT scan of the chest as part of the lung screening.  The CT scan showed a mass in the AP window measuring about 3 to 4 cm.  There is no obvious lung mass.  He then had a PET scan done which showed intense hypermetabolism in the mediastinal mass.  The patient states that he does get somewhat short of breath with activities.  He has a multitude of other medical problems including diabetes hypertension tremor of his left upper extremity vascular disease and COPD.  He states that he has not had any fevers or night sweats although about 2 months ago he was diagnosed as having night terrors and had some night sweats at that time.  He been placed on medication and feels better with that and has had no further night terrors and has had no night sweats.  He does carry history of a cerebral aneurysm but in fact states that this was actually ruled out at [facility].  He has had an extensive intra-abdominal procedure done when a forklift injured his abdomen and he underwent partial hepatectomy and pancreatectomy at that time   Past Medical History:  Diagnosis Date  . Cerebral aneurysm   . Diabetes mellitus without complication [facility])     Past Surgical History:  Procedure Laterality Date  . ABDOMINAL SURGERY    . IR GENERIC HISTORICAL  05/31/2016   IR ANGIO VERTEBRAL SEL VERTEBRAL BILAT MOD SED 05/31/2016 Consuella Lose, MD [facility]-INTER[facility] RAD  . IR GENERIC HISTORICAL  05/31/2016   IR ANGIO INTRA EXTRACRAN SEL INTERNAL CAROTID BILAT MOD SED 05/31/2016 Consuella Lose, MD [facility]  RAD    No family history on file.  Social History Social History   Tobacco Use  . Smoking status: Current Every Day Smoker    Packs/day: 1.50    Years: 50.00    Pack years: 75.00    Types: Cigarettes  . Smokeless tobacco: Never Used  Substance Use Topics  . Alcohol use: No  . Drug use: No    Allergies  Allergen Reactions  . Amoxicillin Hives  . Codeine     Current Outpatient Medications  Medication Sig Dispense Refill  . amLODipine (NORVASC) 10 MG tablet Take 10 mg by mouth daily.    . DULoxetine (CYMBALTA) 30 MG capsule Take 30 mg by mouth daily.    Marland Kitchen gabapentin (NEURONTIN) 600 MG tablet Take 600 mg by mouth every 6 (six) hours as needed (pain. Not to exceed 3600 mg).     Marland Kitchen glipiZIDE (GLUCOTROL XL) 10 MG 24 hr tablet Take 10 mg by mouth daily with breakfast.    . lovastatin (MEVACOR) 20 MG tablet Take 20 mg by mouth every evening.    . metFORMIN (GLUCOPHAGE) 1000 MG tablet Take 1,000 mg by mouth 2 (two) times daily with a meal.    . sitaGLIPtin (JANUVIA) 25 MG tablet Take 25 mg by mouth daily.     No current facility-administered medications for this visit.       Review of Systems A complete review of systems was asked  and was negative except for the following positive findings leg pain, shortness of breath, productive cough, ankle swelling, gum disease, diabetes, glasses  Blood pressure 131/88, pulse 71, temperature (!) 97.2 F (36.2 C), temperature source Skin, height 5\' 8"  (1.727 m), weight 148 lb (67.1 kg), SpO2 96 %.  Physical Exam CONSTITUTIONAL:  Pleasant, well-developed, well-nourished, and in no acute distress. EYES: Pupils equal and reactive to light, Sclera non-icteric EARS, NOSE, MOUTH AND THROAT:  The oropharynx was clear.  Dentition is poor repair.  Oral mucosa pink and moist. LYMPH NODES:  Lymph nodes in the neck and axillae were normal RESPIRATORY:  Lungs were clear.  Normal respiratory effort without pathologic use of accessory muscles of  respiration CARDIOVASCULAR: Heart was regular without murmurs.  There were no carotid bruits. GI: The abdomen was soft, nontender, and nondistended. There were no palpable masses. There was no hepatosplenomegaly. There were normal bowel sounds in all quadrants. GU:  Rectal deferred.   MUSCULOSKELETAL:  Normal muscle strength and tone.  No clubbing or cyanosis.   SKIN:  There were no pathologic skin lesions.  There were no nodules on palpation. NEUROLOGIC:  Sensation is normal.  Cranial nerves are grossly intact. PSYCH:  Oriented to person, place and time.  Mood and affect are normal.  Data Reviewed CT scan  I have personally reviewed the patient's imaging, laboratory findings and medical records.    Assessment    I have independently reviewed the patient's CT scan and PET scan.  There is a large hypermetabolic mass present in the AP window.    Plan    I reviewed with him the options.  I believe that this is most likely small cell carcinoma or lymphoma.  Certainly a thymomas can considered as well.  I reviewed with him the indications and risks of left anterior thoracotomy Barbra Sarks procedure) and biopsy of his mediastinal mass.  Risks of bleeding, infection, injury to the phrenic or recurrent laryngeal nerve, air leak and death were all reviewed.  He understands that this may or may not require additional surgery in the future depending upon the biopsy results.  After an extensive discussion with him and his wife they would like to meet with our oncologist.  We will go ahead and set that up and I will see the patient back once he is been evaluated by our oncology department.      [name], MD 01/05/2019, 1:49 PM

## 2019-01-07 ENCOUNTER — Ambulatory Visit: Payer: BC Managed Care – PPO | Admitting: Oncology

## 2019-01-08 ENCOUNTER — Other Ambulatory Visit: Payer: Self-pay

## 2019-01-09 ENCOUNTER — Inpatient Hospital Stay: Payer: Medicare Other

## 2019-01-09 ENCOUNTER — Encounter: Payer: Self-pay | Admitting: Internal Medicine

## 2019-01-09 ENCOUNTER — Other Ambulatory Visit: Payer: Self-pay

## 2019-01-09 ENCOUNTER — Inpatient Hospital Stay: Payer: Medicare Other | Attending: Internal Medicine | Admitting: Internal Medicine

## 2019-01-09 ENCOUNTER — Telehealth: Payer: Self-pay | Admitting: Internal Medicine

## 2019-01-09 VITALS — BP 122/64 | HR 73 | Temp 99.5°F | Resp 20 | Ht 68.0 in | Wt 147.9 lb

## 2019-01-09 DIAGNOSIS — R590 Localized enlarged lymph nodes: Secondary | ICD-10-CM

## 2019-01-09 DIAGNOSIS — D649 Anemia, unspecified: Secondary | ICD-10-CM | POA: Diagnosis not present

## 2019-01-09 DIAGNOSIS — R222 Localized swelling, mass and lump, trunk: Secondary | ICD-10-CM

## 2019-01-09 DIAGNOSIS — I10 Essential (primary) hypertension: Secondary | ICD-10-CM | POA: Insufficient documentation

## 2019-01-09 DIAGNOSIS — F17210 Nicotine dependence, cigarettes, uncomplicated: Secondary | ICD-10-CM | POA: Insufficient documentation

## 2019-01-09 DIAGNOSIS — Z79899 Other long term (current) drug therapy: Secondary | ICD-10-CM | POA: Insufficient documentation

## 2019-01-09 DIAGNOSIS — J9859 Other diseases of mediastinum, not elsewhere classified: Secondary | ICD-10-CM | POA: Insufficient documentation

## 2019-01-09 DIAGNOSIS — Z7984 Long term (current) use of oral hypoglycemic drugs: Secondary | ICD-10-CM | POA: Diagnosis not present

## 2019-01-09 DIAGNOSIS — J439 Emphysema, unspecified: Secondary | ICD-10-CM | POA: Diagnosis not present

## 2019-01-09 DIAGNOSIS — E1140 Type 2 diabetes mellitus with diabetic neuropathy, unspecified: Secondary | ICD-10-CM | POA: Diagnosis not present

## 2019-01-09 LAB — COMPREHENSIVE METABOLIC PANEL
ALT: 12 U/L (ref 0–44)
AST: 17 U/L (ref 15–41)
Albumin: 3.9 g/dL (ref 3.5–5.0)
Alkaline Phosphatase: 64 U/L (ref 38–126)
Anion gap: 13 (ref 5–15)
BUN: 14 mg/dL (ref 8–23)
CO2: 26 mmol/L (ref 22–32)
Calcium: 9.5 mg/dL (ref 8.9–10.3)
Chloride: 99 mmol/L (ref 98–111)
Creatinine, Ser: 1.21 mg/dL (ref 0.61–1.24)
GFR calc Af Amer: >60 mL/min (ref >60)
GFR calc non Af Amer: >60 mL/min (ref >60)
Glucose, Bld: 160 mg/dL — ABNORMAL HIGH (ref 70–99)
Potassium: 3.8 mmol/L (ref 3.5–5.1)
Sodium: 138 mmol/L (ref 135–145)
Total Bilirubin: 0.4 mg/dL (ref 0.3–1.2)
Total Protein: 7.2 g/dL (ref 6.5–8.1)

## 2019-01-09 LAB — CBC WITH DIFFERENTIAL/PLATELET
Abs Immature Granulocytes: 0.02 10*3/uL (ref 0.00–0.07)
Basophils Absolute: 0.1 10*3/uL (ref 0.0–0.1)
Basophils Relative: 1 %
Eosinophils Absolute: 0.4 10*3/uL (ref 0.0–0.5)
Eosinophils Relative: 5 %
HCT: 27.7 % — ABNORMAL LOW (ref 39.0–52.0)
Hemoglobin: 8.6 g/dL — ABNORMAL LOW (ref 13.0–17.0)
Immature Granulocytes: 0 %
Lymphocytes Relative: 21 %
Lymphs Abs: 1.6 10*3/uL (ref 0.7–4.0)
MCH: 23.4 pg — ABNORMAL LOW (ref 26.0–34.0)
MCHC: 31 g/dL (ref 30.0–36.0)
MCV: 75.3 fL — ABNORMAL LOW (ref 80.0–100.0)
Monocytes Absolute: 0.7 10*3/uL (ref 0.1–1.0)
Monocytes Relative: 9 %
Neutro Abs: 5 10*3/uL (ref 1.7–7.7)
Neutrophils Relative %: 64 %
Platelets: 201 10*3/uL (ref 150–400)
RBC: 3.68 MIL/uL — ABNORMAL LOW (ref 4.22–5.81)
RDW: 16.5 % — ABNORMAL HIGH (ref 11.5–15.5)
WBC: 7.7 10*3/uL (ref 4.0–10.5)
nRBC: 0 % (ref 0.0–0.2)

## 2019-01-09 LAB — LACTATE DEHYDROGENASE: LDH: 112 U/L (ref 98–192)

## 2019-01-09 LAB — VITAMIN B12: Vitamin B-12: 251 pg/mL (ref 180–914)

## 2019-01-09 NOTE — Progress Notes (Signed)
[facility] NOTE  Patient Care Team: Laneta Simmers, NP as PCP - General (Nurse Practitioner) Telford Nab, RN as Registered Nurse  CHIEF COMPLAINTS/PURPOSE OF CONSULTATION: Mediastinal mass  #Mediastinal mass ~3.5 cm [aortopulmonary window]-incidental/high SUV uptake  # DM-2- on O[facility]/ COPD/smoker/ PVD  # History of alcohol abuse/quit 2014/ Hx of abdominal trauma [at 21y]  Oncology History   No history exists.     HISTORY OF PRESENTING ILLNESS:  Kevin Mckay 68 y.o.  male with longstanding history of smoking/active smoker-has been referred to [place] for further evaluation and recommendations for a mediastinal mass.  Patient states that he had pneumonia approximately 6 months ago for which he was treated with antibiotics on outpatient basis.  However on further follow-up he noted to have mediastinal mass on imaging which led to a CT scan/and subsequently PET scan.  As above  Patient has been evaluated by thoracic surgery; he recommended open biopsy.  Patient also complains of chronic tingling and numbness dexamethasone pain in his hand and feet for the last 5 years which he attributes to diabetes/neuropathy.  He denies any weight loss.  Has chronic shortness of breath chronic cough.  Fatigue.  Review of Systems  Constitutional: Positive for malaise/fatigue. Negative for chills, diaphoresis, fever and weight loss.  HENT: Negative for nosebleeds and sore throat.   Eyes: Negative for double vision.  Respiratory: Positive for cough, sputum production and shortness of breath. Negative for hemoptysis and wheezing.   Cardiovascular: Negative for chest pain, palpitations, orthopnea and leg swelling.  Gastrointestinal: Negative for abdominal pain, blood in stool, constipation, diarrhea, heartburn, melena, nausea and vomiting.  Genitourinary: Negative for dysuria, frequency and urgency.  Musculoskeletal: Negative for back pain and joint pain.  Skin: Negative.   Negative for itching and rash.  Neurological: Positive for tingling. Negative for dizziness, focal weakness, weakness and headaches.  Endo/Heme/Allergies: Does not bruise/bleed easily.  Psychiatric/Behavioral: Negative for depression. The patient is not nervous/anxious and does not have insomnia.      MEDICAL HISTORY:  Past Medical History:  Diagnosis Date  . Cerebral aneurysm   . Chronic painful diabetic neuropathy ([place])   . Diabetes mellitus without complication ([place])   . Essential hypertension   . Occasional tremors   . Tobacco use     SURGICAL HISTORY: Past Surgical History:  Procedure Laterality Date  . ABDOMINAL SURGERY     age 57. trauma surgery due to forklift injury- liver and spleen  . IR GENERIC HISTORICAL  05/31/2016   IR ANGIO VERTEBRAL SEL VERTEBRAL BILAT MOD SED 05/31/2016 Consuella Lose, MD [facility]  . IR GENERIC HISTORICAL  05/31/2016   IR ANGIO INTRA EXTRACRAN SEL INTERNAL CAROTID BILAT MOD SED 05/31/2016 Consuella Lose, MD [facility]    SOCIAL HISTORY: Social History   Socioeconomic History  . Marital status: Married    Spouse name: Not on file  . Number of children: Not on file  . Years of education: Not on file  . Highest education level: Not on file  Occupational History  . Not on file  Social Needs  . Financial resource strain: Not on file  . Food insecurity    Worry: Not on file    Inability: Not on file  . Transportation needs    Medical: Not on file    Non-medical: Not on file  Tobacco Use  . Smoking status: Current Every Day Smoker    Packs/day: 1.50    Years: 50.00    Pack years: 75.00  Types: Cigarettes  . Smokeless tobacco: Never Used  Substance and Sexual Activity  . Alcohol use: No  . Drug use: Yes    Types: Barbituates  . Sexual activity: Not on file  Lifestyle  . Physical activity    Days per week: Not on file    Minutes per session: Not on file  . Stress: Not on file  Relationships  . Social Product manager on phone: Not on file    Gets together: Not on file    Attends religious service: Not on file    Active member of club or organization: Not on file    Attends meetings of clubs or organizations: Not on file    Relationship status: Not on file  . Intimate partner violence    Fear of current or ex partner: Not on file    Emotionally abused: Not on file    Physically abused: Not on file    Forced sexual activity: Not on file  Other Topics Concern  . Not on file  Social History Narrative   Lives in [place]; wife/ son/grandson [custody]; smoker; vending business; hx of alcoholism- quit at 5.     FAMILY HISTORY: Family History  Problem Relation Age of Onset  . Lymphoma Father   . Liver cancer Paternal Uncle   . Lung cancer Paternal Uncle   . Lung cancer Maternal Uncle     ALLERGIES:  is allergic to amoxicillin and codeine.  MEDICATIONS:  Current Outpatient Medications  Medication Sig Dispense Refill  . amLODipine (NORVASC) 10 MG tablet Take 10 mg by mouth daily.    . DULoxetine (CYMBALTA) 30 MG capsule Take 30 mg by mouth daily.    Marland Kitchen gabapentin (NEURONTIN) 600 MG tablet Take 600 mg by mouth every 6 (six) hours as needed (pain. Not to exceed 3600 mg).     Marland Kitchen glipiZIDE (GLUCOTROL XL) 10 MG 24 hr tablet Take 10 mg by mouth daily with breakfast.    . hydrochlorothiazide (HYDRODIURIL) 25 MG tablet Take 1 tablet by mouth daily.    Marland Kitchen lovastatin (MEVACOR) 20 MG tablet Take 20 mg by mouth every evening.    . metFORMIN (GLUCOPHAGE) 1000 MG tablet Take 1,000 mg by mouth 2 (two) times daily with a meal.    . sitaGLIPtin (JANUVIA) 25 MG tablet Take 25 mg by mouth daily.     No current facility-administered medications for this visit.       Marland Kitchen  PHYSICAL EXAMINATION: ECOG PERFORMANCE STATUS: 0 - Asymptomatic  Vitals:   01/09/19 1455  BP: 122/64  Pulse: 73  Resp: 20  Temp: 99.5 F (37.5 C)   Filed Weights   01/09/19 1455  Weight: 147 lb 14.4 oz (67.1 kg)    Physical  Exam  Constitutional: He is oriented to person, place, and time and well-developed, well-nourished, and in no distress.  HENT:  Head: Normocephalic and atraumatic.  Mouth/Throat: Oropharynx is clear and moist. No oropharyngeal exudate.  Eyes: Pupils are equal, round, and reactive to light.  Neck: Normal range of motion. Neck supple.  Cardiovascular: Normal rate and regular rhythm.  Pulmonary/Chest: No respiratory distress. He has no wheezes.  Abdominal: Soft. Bowel sounds are normal. He exhibits no distension and no mass. There is no abdominal tenderness. There is no rebound and no guarding.  Musculoskeletal: Normal range of motion.        General: No tenderness or edema.  Neurological: He is alert and oriented to person, place, and time.  Skin: Skin is warm.  Psychiatric: Affect normal.     LABORATORY DATA:  I have reviewed the data as listed Lab Results  Component Value Date   WBC 7.7 01/09/2019   HGB 8.6 (L) 01/09/2019   HCT 27.7 (L) 01/09/2019   MCV 75.3 (L) 01/09/2019   PLT 201 01/09/2019   Recent Labs    01/09/19 1539  NA 138  K 3.8  CL 99  CO2 26  GLUCOSE 160*  BUN 14  CREATININE 1.21  CALCIUM 9.5  GFRNONAA >60  GFRAA >60  PROT 7.2  ALBUMIN 3.9  AST 17  ALT 12  ALKPHOS 64  BILITOT 0.4    RADIOGRAPHIC STUDIES: I have personally reviewed the radiological images as listed and agreed with the findings in the report. Nm Pet Image Initial (pi) Skull Base To Thigh  Result Date: 12/25/2018 CLINICAL DATA:  Initial treatment strategy for AP window adenopathy seen on recent screening chest CT study. EXAM: NUCLEAR MEDICINE PET SKULL BASE TO THIGH TECHNIQUE: 7.7 mCi F-18 FDG was injected intravenously. Full-ring PET imaging was performed from the skull base to thigh after the radiotracer. CT data was obtained and used for attenuation correction and anatomic localization. Fasting blood glucose: 121 mg/dl COMPARISON:  11/26/2018 screening chest CT. 04/10/2013 CT  abdomen/pelvis. FINDINGS: Mediastinal blood pool activity: SUV max 2.3 Liver activity: SUV max NA NECK: No hypermetabolic lymph nodes in the neck. Incidental CT findings: Mucoperiosteal thickening in bilateral maxillary sinuses with mucous retention cyst versus polyp in inferior right maxillary sinus. CHEST: There is a hypermetabolic 3.1 x 2.4 cm AP window soft tissue mass with max SUV 7.0 (series 3/image 73). Otherwise no hypermetabolic mediastinal nodes. No hypermetabolic axillary or hilar adenopathy. No hypermetabolic pulmonary findings. Incidental CT findings: Coronary atherosclerosis. Atherosclerotic nonaneurysmal thoracic aorta. Mild centrilobular emphysema with diffuse bronchial wall thickening. No acute consolidative airspace disease, lung masses or significant pulmonary nodules. ABDOMEN/PELVIS: No abnormal hypermetabolic activity within the liver, pancreas, adrenal glands, or spleen. No hypermetabolic lymph nodes in the abdomen or pelvis. Incidental CT findings: Atherosclerotic nonaneurysmal abdominal aorta. Apparent absence of body and tail of the pancreas. Marked sigmoid diverticulosis. Top-normal size prostate. SKELETON: No focal hypermetabolic activity to suggest skeletal metastasis. Incidental CT findings: none IMPRESSION: 1. Hypermetabolic (max SUV 7.0) AP window 3.1 x 2.4 cm soft tissue mass, compatible with malignancy, such as due to small cell lung carcinoma or less commonly a thymic malignancy in this location. Thoracic surgical consultation advised. 2. No hypermetabolic pulmonary findings. No additional hypermetabolic thoracic lymph nodes. No hypermetabolic extrathoracic or skeletal metastatic disease. 3. Chronic findings include: Aortic Atherosclerosis (ICD10-I70.0) and Emphysema (ICD10-J43.9). Coronary atherosclerosis. Chronic paranasal sinusitis. Marked sigmoid diverticulosis. Electronically Signed   By: Ilona Sorrel M.D.   On: 12/25/2018 12:45    ASSESSMENT & PLAN:   Mediastinal  mass #Soft tissue mass/aortopulmonary window 3 x 2.5 cm; highly PET avid.  This is highly suspicious for malignancy.  #Had a long discussion the patient regarding concerns for-aggressive malignancy based on high PET uptake.  However important to confirm the diagnosis; also type of malignancy-which would help [place] determine treatment/prognosis etc.  #Patient initially reluctant; however agrees to proceed with our recommendation for biopsy.  Discussed with Dr. Genevive Bi, who feels open/surgical biopsy is most feasible.  I also discussed the tumor conference.  #Diabetes-oral hypoglycemic agents.  Stable as per patient.  #Peripheral neuropathy-question diabetes versus others.  Check myeloma panel/B12.   #Active smoker-discussed smoking cessation.  Patient interested in quitting smoking/is currently trying  to switch her to vaping.   # Thank you Dr.Oaks for allowing me to participate in the care of your pleasant patient. Please do not hesitate to contact me with questions or concerns in the interim.  # I reviewed the blood work- with the patient in detail; also reviewed the imaging independently [as summarized above]; and with the patient in detail.   # DISPOSITION: # labs today # follow up TBD- Dr.B  Addendum: Hemoglobin is 8.6; MCV-75-? iron deficiency versus anemia of chronic disease.  Will check iron studies if feasible.  Cc; Hayley    All questions were answered. The patient knows to call the clinic with any problems, questions or concerns.       Cammie Sickle, MD 01/09/2019 9:32 PM

## 2019-01-09 NOTE — Assessment & Plan Note (Addendum)
#  Soft tissue mass/aortopulmonary window 3 x 2.5 cm; highly PET avid.  This is highly suspicious for malignancy.  #Had a long discussion the patient regarding concerns for-aggressive malignancy based on high PET uptake.  However important to confirm the diagnosis; also type of malignancy-which would help [place] determine treatment/prognosis etc.  #Patient initially reluctant; however agrees to proceed with our recommendation for biopsy.  Discussed with Dr. Genevive Bi, who feels open/surgical biopsy is most feasible.  I also discussed the tumor conference.  #Diabetes-oral hypoglycemic agents.  Stable as per patient.  #Peripheral neuropathy-question diabetes versus others.  Check myeloma panel/B12.   #Active smoker-discussed smoking cessation.  Patient interested in quitting smoking/is currently trying to switch her to vaping.   # Thank you Dr.Oaks for allowing me to participate in the care of your pleasant patient. Please do not hesitate to contact me with questions or concerns in the interim.  # I reviewed the blood work- with the patient in detail; also reviewed the imaging independently [as summarized above]; and with the patient in detail.   # DISPOSITION: # labs today # follow up TBD- Dr.B  Addendum: Hemoglobin is 8.6; MCV-75-? iron deficiency versus anemia of chronic disease.  Will check iron studies if feasible.  Cc; Hayley

## 2019-01-09 NOTE — Telephone Encounter (Signed)
Haley-please follow-up with the patient regarding his surgical plans with Dr. Faith Rogue.  We will also discuss the tumor conference.   Surprisingly patient has anemia-8.6- please order iron studies ferritin-if possible/add to labs drawn.  GB cell

## 2019-01-12 LAB — MULTIPLE MYELOMA PANEL, SERUM
Albumin SerPl Elph-Mcnc: 3.9 g/dL (ref 2.9–4.4)
Albumin/Glob SerPl: 1.6 (ref 0.7–1.7)
Alpha 1: 0.3 g/dL (ref 0.0–0.4)
Alpha2 Glob SerPl Elph-Mcnc: 0.9 g/dL (ref 0.4–1.0)
B-Globulin SerPl Elph-Mcnc: 0.8 g/dL (ref 0.7–1.3)
Gamma Glob SerPl Elph-Mcnc: 0.6 g/dL (ref 0.4–1.8)
Globulin, Total: 2.6 g/dL (ref 2.2–3.9)
IgA: 86 mg/dL (ref 61–437)
IgG (Immunoglobin G), Serum: 732 mg/dL (ref 603–1613)
IgM (Immunoglobulin M), Srm: 129 mg/dL (ref 20–172)
Total Protein ELP: 6.5 g/dL (ref 6.0–8.5)

## 2019-01-12 LAB — KAPPA/LAMBDA LIGHT CHAINS
Kappa free light chain: 29.4 mg/L — ABNORMAL HIGH (ref 3.3–19.4)
Kappa, lambda light chain ratio: 1.22 (ref 0.26–1.65)
Lambda free light chains: 24.1 mg/L (ref 5.7–26.3)

## 2019-01-12 NOTE — Telephone Encounter (Signed)
Orders have been placed but labs cannot be added to labs drawn on Friday. Please advise.

## 2019-01-12 NOTE — Addendum Note (Signed)
Addended by: Telford Nab on: 01/12/2019 10:02 AM   Modules accepted: Orders

## 2019-01-15 ENCOUNTER — Other Ambulatory Visit: Payer: BC Managed Care – PPO

## 2019-01-15 ENCOUNTER — Telehealth: Payer: Self-pay

## 2019-01-15 NOTE — Telephone Encounter (Signed)
Patient added to schedule 01/16/2019 per Dr.Oaks. Patient asked to arrive at 8:15 am.

## 2019-01-15 NOTE — Progress Notes (Signed)
Tumor Board Documentation  Kevin Mckay was presented by Dr Rogue Bussing at our Tumor Board on 01/15/2019, which included representatives from medical oncology, radiation oncology, pathology, radiology, surgical, surgical oncology, navigation, internal medicine, research.  Kevin Mckay currently presents as a current patient, for discussion with history of the following treatments: active survellience.  Additionally, we reviewed previous medical and familial history, history of present illness, and recent lab results along with all available histopathologic and imaging studies. The tumor board considered available treatment options and made the following recommendations: Biopsy Refer to Dr Genevive Bi for biopsy  The following procedures/referrals were also placed: No orders of the defined types were placed in this encounter.   Clinical Trial Status: not discussed   Staging used: Not Applicable  National site-specific guidelines   were discussed with respect to the case.  Tumor board is a meeting of clinicians from various specialty areas who evaluate and discuss patients for whom a multidisciplinary approach is being considered. Final determinations in the plan of care are those of the provider(s). The responsibility for follow up of recommendations given during tumor board is that of the provider.   Today's extended care, comprehensive team conference, Kevin Mckay was not present for the discussion and was not examined.   Multidisciplinary Tumor Board is a multidisciplinary case peer review process.  Decisions discussed in the Multidisciplinary Tumor Board reflect the opinions of the specialists present at the conference without having examined the patient.  Ultimately, treatment and diagnostic decisions rest with the primary provider(s) and the patient.

## 2019-01-16 ENCOUNTER — Ambulatory Visit
Admission: RE | Admit: 2019-01-16 | Discharge: 2019-01-16 | Disposition: A | Discharge status: Home / Self Care | Payer: BC Managed Care – PPO | Source: Ambulatory Visit | Attending: Cardiothoracic Surgery | Admitting: Cardiothoracic Surgery

## 2019-01-16 ENCOUNTER — Other Ambulatory Visit: Payer: Self-pay

## 2019-01-16 ENCOUNTER — Encounter: Payer: Self-pay | Admitting: Cardiothoracic Surgery

## 2019-01-16 ENCOUNTER — Telehealth: Payer: Self-pay

## 2019-01-16 ENCOUNTER — Other Ambulatory Visit: Payer: Self-pay | Admitting: Cardiothoracic Surgery

## 2019-01-16 ENCOUNTER — Ambulatory Visit (INDEPENDENT_AMBULATORY_CARE_PROVIDER_SITE_OTHER): Payer: BC Managed Care – PPO | Admitting: Cardiothoracic Surgery

## 2019-01-16 VITALS — BP 135/76 | HR 69 | Temp 97.9°F | Ht 68.0 in | Wt 151.4 lb

## 2019-01-16 DIAGNOSIS — R918 Other nonspecific abnormal finding of lung field: Secondary | ICD-10-CM | POA: Diagnosis present

## 2019-01-16 DIAGNOSIS — M7989 Other specified soft tissue disorders: Secondary | ICD-10-CM

## 2019-01-16 NOTE — Patient Instructions (Addendum)
Go get your EKG and Chest X-Ray done today at [facility].  Patient's surgery to be scheduled for (to be determined) at [facility] with Dr. Genevive Bi.  The patient is aware to have COVID-19 testing done at the [facility] building drive thru ([address] [place]) between 8:00 am and 10:30 am. He is aware to isolate after, have no visitors, wash hands frequently, and avoid touching face. We will call him to give him his arrival time and date.   The patient is aware hee will need to Pre-Admit. Patient will check in at the [place] entrance due to COVID-19 restrictions and will then be escorted to the [place], [address] (first floor). Patient will be contacted once Pre-admission appointment has been arranged with date and time.   Patient aware to be NPO after midnight and have a driver.   He is aware to check in at the [place] entrance where he will be screened for the coronavirus and then sent to Same Day Surgery.   Patient aware that he might not be able to have visitors and driver may have to wait in the car due to COVID-19 restrictions.   The patient verbalizes understanding of the above.   The patient is aware to call the office should he have further questions.      Lung Biopsy  A lung biopsy is a procedure to remove a tissue sample from the lung. The tissue can be examined under a microscope to help diagnose various lung disorders. There are three types of lung biopsies:  Needle biopsy. In this type, the sample is removed with a needle.  Bronchoscopy. In this type, the sample is removed through a flexible tube inserted into the lungs (bronchoscope).  Open biopsy. In this type, the sample is removed through an incision made in the chest. Tell a health care provider about:  Any allergies you have.  All medicines you are taking, including vitamins, herbs, eye drops, creams, and over-the-counter medicines.  Any problems you or family members have had with  anesthetic medicines.  Any blood disorders or bleeding problems that you have.  Any surgeries you have had.  Any medical conditions you have.  Whether you are pregnant or may be pregnant. What are the risks? Generally, this is a safe procedure. However, problems may occur, including:  Collapsed lung.  Bleeding.  Infection.  Pain. What happens before the procedure? Staying hydrated Follow instructions from your health care provider about hydration, which may include:  Up to 2 hours before the procedure - you may continue to drink clear liquids, such as water, clear fruit juice, black coffee, and plain tea. Eating and drinking restrictions Follow instructions from your health care provider about eating and drinking, which may include:  2 hours before the procedure - stop drinking clear liquids. General instructions  Ask your health care provider about: ? Changing or stopping your regular medicines. This is especially important if you take diabetes medicines or blood thinners. ? Taking medicines such as aspirin and ibuprofen. These medicines can thin your blood. Do not take these medicines before your procedure if your health care provider instructs you not to.  Plan to have someone take you home from the hospital or clinic. What happens during the procedure?  To lower your risk of infection: ? Your health care team will wash or sanitize their hands. ? If you are having an open biopsy, your skin will be washed with soap.  An IV tube may  be inserted into one of your veins.  You may be given one or both of the following: ? A medicine to help you relax (sedative) during the procedure. ? A medicine to numb the area where the biopsy sample will be taken (local anesthetic). ? A medicine to make you sleep through the procedure (general anesthetic).  If you have a needle biopsy: ? A biopsy needle will be inserted into your lung. A CT scanner may be used to guide the needle to  the right place. ? The needle will be used to collect the tissue sample.  If you have bronchoscopy: ? A bronchoscope will be inserted into your lungs through your mouth or nose. ? A needle or forceps will be passed through the bronchoscope to remove the tissue sample.  If you have an open biopsy: ? An incision will be made in your chest. ? The tissue sample will be removed using surgical tools. ? The incision will be closed with skin glue, skin adhesive strips, or stitches (sutures). The procedure may vary among health care providers and hospitals. What happens after the procedure?  Your blood pressure, heart rate, breathing rate, and blood oxygen level will be monitored until the medicines you were given have worn off.  If a needle biopsy was performed, a bandage (dressing) will be applied over the area where the needle was inserted. You may be asked to apply pressure to the bandage for several minutes to ensure there is minimal bleeding.  If a bronchoscope was used, you may have a cough and some soreness in your throat.  Do not drive for 24 hours if you were given a sedative. Summary  A lung biopsy is a procedure to remove a tissue sample from your lung. The sample is examined to help diagnose various lung disorders.  A lung biopsy may be done using a needle, by inserting a tube into the lungs, or through an incision in the chest.  After your lung biopsy, you may have a cough and some throat soreness. This information is not intended to replace advice given to you by your health care provider. Make sure you discuss any questions you have with your health care provider. Document Released: 08/30/2004 Document Revised: 05/24/2017 Document Reviewed: 05/02/2016 Elsevier Patient Education  2020 [place] An implanted port is a device that is placed under the skin. It is usually placed in the chest. The device can be used to give IV medicine, to take  blood, or for dialysis. You may have an implanted port if:  You need IV medicine that would be irritating to the small veins in your hands or arms.  You need IV medicines, such as antibiotics, for a long period of time.  You need IV nutrition for a long period of time.  You need dialysis. Having a port means that your health care provider will not need to use the veins in your arms for these procedures. You may have fewer limitations when using a port than you would if you used other types of long-term IVs, and you will likely be able to return to normal activities after your incision heals. An implanted port has two main parts:  Reservoir. The reservoir is the part where a needle is inserted to give medicines or draw blood. The reservoir is round. After it is placed, it appears as a small, raised area under your skin.  Catheter. The catheter is a thin, flexible tube that connects  the reservoir to a vein. Medicine that is inserted into the reservoir goes into the catheter and then into the vein. How is my port accessed? To access your port:  A numbing cream may be placed on the skin over the port site.  Your health care provider will put on a mask and sterile gloves.  The skin over your port will be cleaned carefully with a germ-killing soap and allowed to dry.  Your health care provider will gently pinch the port and insert a needle into it.  Your health care provider will check for a blood return to make sure the port is in the vein and is not clogged.  If your port needs to remain accessed to get medicine continuously (constant infusion), your health care provider will place a clear bandage (dressing) over the needle site. The dressing and needle will need to be changed every week, or as told by your health care provider. What is flushing? Flushing helps keep the port from getting clogged. Follow instructions from your health care provider about how and when to flush the port. Ports  are usually flushed with saline solution or a medicine called heparin. The need for flushing will depend on how the port is used:  If the port is only used from time to time to give medicines or draw blood, the port may need to be flushed: ? Before and after medicines have been given. ? Before and after blood has been drawn. ? As part of routine maintenance. Flushing may be recommended every 4-6 weeks.  If a constant infusion is running, the port may not need to be flushed.  Throw away any syringes in a disposal container that is meant for sharp items (sharps container). You can buy a sharps container from a pharmacy, or you can make one by using an empty hard plastic bottle with a cover. How long will my port stay implanted? The port can stay in for as long as your health care provider thinks it is needed. When it is time for the port to come out, a surgery will be done to remove it. The surgery will be similar to the procedure that was done to put the port in. Follow these instructions at home:   Flush your port as told by your health care provider.  If you need an infusion over several days, follow instructions from your health care provider about how to take care of your port site. Make sure you: ? Wash your hands with soap and water before you change your dressing. If soap and water are not available, use alcohol-based hand sanitizer. ? Change your dressing as told by your health care provider. ? Place any used dressings or infusion bags into a plastic bag. Throw that bag in the trash. ? Keep the dressing that covers the needle clean and dry. Do not get it wet. ? Do not use scissors or sharp objects near the tube. ? Keep the tube clamped, unless it is being used.  Check your port site every day for signs of infection. Check for: ? Redness, swelling, or pain. ? Fluid or blood. ? Pus or a bad smell.  Protect the skin around the port site. ? Avoid wearing bra straps that rub or  irritate the site. ? Protect the skin around your port from seat belts. Place a soft pad over your chest if needed.  Bathe or shower as told by your health care provider. The site may get wet as long  as you are not actively receiving an infusion.  Return to your normal activities as told by your health care provider. Ask your health care provider what activities are safe for you.  Carry a medical alert card or wear a medical alert bracelet at all times. This will let health care providers know that you have an implanted port in case of an emergency. Get help right away if:  You have redness, swelling, or pain at the port site.  You have fluid or blood coming from your port site.  You have pus or a bad smell coming from the port site.  You have a fever. Summary  Implanted ports are usually placed in the chest for long-term IV access.  Follow instructions from your health care provider about flushing the port and changing bandages (dressings).  Take care of the area around your port by avoiding clothing that puts pressure on the area, and by watching for signs of infection.  Protect the skin around your port from seat belts. Place a soft pad over your chest if needed.  Get help right away if you have a fever or you have redness, swelling, pain, drainage, or a bad smell at the port site. This information is not intended to replace advice given to you by your health care provider. Make sure you discuss any questions you have with your health care provider. Document Released: 06/11/2005 Document Revised: 10/03/2018 Document Reviewed: 07/14/2016 Elsevier Patient Education  2020 [employer].

## 2019-01-16 NOTE — Progress Notes (Signed)
  Patient ID: Kevin Mckay, male   DOB: 04/10/1951, 68 y.o.   MRN: [id number]  HISTORY: He returns today in follow-up.  He did have an opportunity to meet with Dr. Iris Pert Monday.  He has had an extensive evaluation performed already.  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  He comes in today to review the options for anterior thoracotomy with biopsy of his mediastinal mass and possible Port-A-Cath insertion.   Vitals:   01/16/19 0814  BP: 135/76  Pulse: 69  Temp: 97.9 F (36.6 C)  SpO2: 97%     EXAM:    Resp: Lungs are clear bilaterally.  No respiratory distress, normal effort. Heart:  Regular without murmurs Abd:  Abdomen is soft, non distended and non tender. No masses are palpable.  There is no rebound and no guarding.  Neurological: Alert and oriented to person, place, and time. Coordination normal.  Skin: Skin is warm and dry. No rash noted. No diaphoretic. No erythema. No pallor.  Psychiatric: Normal mood and affect. Normal behavior. Judgment and thought content normal.    ASSESSMENT: Mediastinal mass   PLAN:   I had a long discussion with him and his wife today regarding the options.  Because of the location of this lesion it was not possible to perform an transthoracic biopsy.  I reviewed with him the indications and risks of left anterior thoracotomy with frozen section.  Risks of bleeding, infection, air leak, injury to recurrent nerve, injury to phrenic nerve, death were all discussed.  He understands that the frozen section may dictate a malignant diagnosis and the Port-A-Cath might be in place.  We reviewed the indications and risks of that.  He understands that the initial frozen section diagnosis may not be the permanent diagnosis.  He would like [place] to proceed.

## 2019-01-16 NOTE — H&P (View-Only) (Signed)
  Patient ID: Kevin Mckay, male   DOB: 07-21-1950, 68 y.o.   MRN: [id number]  HISTORY: He returns today in follow-up.  He did have an opportunity to meet with Dr. Iris Pert Monday.  He has had an extensive evaluation performed already.  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  He comes in today to review the options for anterior thoracotomy with biopsy of his mediastinal mass and possible Port-A-Cath insertion.   Vitals:   01/16/19 0814  BP: 135/76  Pulse: 69  Temp: 97.9 F (36.6 C)  SpO2: 97%     EXAM:    Resp: Lungs are clear bilaterally.  No respiratory distress, normal effort. Heart:  Regular without murmurs Abd:  Abdomen is soft, non distended and non tender. No masses are palpable.  There is no rebound and no guarding.  Neurological: Alert and oriented to person, place, and time. Coordination normal.  Skin: Skin is warm and dry. No rash noted. No diaphoretic. No erythema. No pallor.  Psychiatric: Normal mood and affect. Normal behavior. Judgment and thought content normal.    ASSESSMENT: Mediastinal mass   PLAN:   I had a long discussion with him and his wife today regarding the options.  Because of the location of this lesion it was not possible to perform an transthoracic biopsy.  I reviewed with him the indications and risks of left anterior thoracotomy with frozen section.  Risks of bleeding, infection, air leak, injury to recurrent nerve, injury to phrenic nerve, death were all discussed.  He understands that the frozen section may dictate a malignant diagnosis and the Port-A-Cath might be in place.  We reviewed the indications and risks of that.  He understands that the initial frozen section diagnosis may not be the permanent diagnosis.  He would like [place] to proceed.

## 2019-01-16 NOTE — Telephone Encounter (Signed)
Call to the patient to talk about surgery.  The patient is scheduled for surgery at [facility] with Dr Genevive Bi on 01/22/19. Dr Dahlia Byes will assist with this surgery. He will have Covid-19 testing done on Monday 01/19/19 at the [place] drive up between 8am and 10:30 am. We will call him about his pre admit testing once this is scheduled.

## 2019-01-19 ENCOUNTER — Encounter
Admission: RE | Admit: 2019-01-19 | Discharge: 2019-01-19 | Disposition: A | Discharge status: Home / Self Care | Payer: BC Managed Care – PPO | Source: Ambulatory Visit | Attending: Cardiothoracic Surgery | Admitting: Cardiothoracic Surgery

## 2019-01-19 ENCOUNTER — Telehealth: Payer: Self-pay

## 2019-01-19 ENCOUNTER — Encounter: Payer: Self-pay | Admitting: *Deleted

## 2019-01-19 ENCOUNTER — Other Ambulatory Visit: Payer: Self-pay

## 2019-01-19 DIAGNOSIS — Z1159 Encounter for screening for other viral diseases: Secondary | ICD-10-CM | POA: Insufficient documentation

## 2019-01-19 DIAGNOSIS — J9859 Other diseases of mediastinum, not elsewhere classified: Secondary | ICD-10-CM

## 2019-01-19 DIAGNOSIS — Z01812 Encounter for preprocedural laboratory examination: Secondary | ICD-10-CM | POA: Insufficient documentation

## 2019-01-19 LAB — CBC
HCT: 27.2 % — ABNORMAL LOW (ref 39.0–52.0)
Hemoglobin: 8.3 g/dL — ABNORMAL LOW (ref 13.0–17.0)
MCH: 23.6 pg — ABNORMAL LOW (ref 26.0–34.0)
MCHC: 30.5 g/dL (ref 30.0–36.0)
MCV: 77.5 fL — ABNORMAL LOW (ref 80.0–100.0)
Platelets: 229 10*3/uL (ref 150–400)
RBC: 3.51 MIL/uL — ABNORMAL LOW (ref 4.22–5.81)
RDW: 17.8 % — ABNORMAL HIGH (ref 11.5–15.5)
WBC: 7.6 10*3/uL (ref 4.0–10.5)
nRBC: 0 % (ref 0.0–0.2)

## 2019-01-19 LAB — SARS CORONAVIRUS 2 (TAT 6-24 HRS): SARS Coronavirus 2: NEGATIVE

## 2019-01-19 NOTE — Telephone Encounter (Signed)
FMLA completed and faxed to [facility] at this time. Placed in scan folder.

## 2019-01-19 NOTE — Progress Notes (Signed)
  Oncology Nurse Navigator Documentation  Navigator Location: [facility] (01/19/19 1000)   )Navigator Encounter Type: Introductory Phone Call (01/19/19 1000)   Abnormal Finding Date: 11/26/18 (01/19/19 1000)                   Treatment Phase: Abnormal Scans (01/19/19 1000) Barriers/Navigation Needs: Coordination of Care (01/19/19 1000)   Interventions: Coordination of Care (01/19/19 1000)   Coordination of Care: Appts (01/19/19 1000)        Acuity: Level 2 (01/19/19 1000)   Acuity Level 2: Initial guidance, education and coordination as needed;Educational needs;Assistance expediting appointments (01/19/19 1000)    phone call made to patient to introduce to navigator services as well as review upcoming appts. All questions answered during visit. Pt informed that Dr. Rogue Bussing wants to follow up with him on 8/6 at 9:15 for labs and to see him at 9:45am to review biopsy results. Appts have been mailed to patient as well. Contact info given and instructed to call with any further questions or needs. Pt verbalized understanding. Nothing further needed at this time. Time Spent with Patient: 30 (01/19/19 1000)

## 2019-01-19 NOTE — Telephone Encounter (Signed)
Patient notified that he will be seen by Pre Admit testing today at 12:45 pm. He is aware to enter in through the [place] at [facility].

## 2019-01-19 NOTE — Patient Instructions (Signed)
  Your procedure is scheduled on: Thursday January 22, 2019 Report to Same Day Surgery 2nd floor [facility] Entrance-take elevator on left to 2nd floor.  Check in with surgery information desk.) To find out your arrival time, call [phone] 1:00-3:00 PM on Wednesday January 21, 2019  Remember: Instructions that are not followed completely may result in serious medical risk, up to and including death, or upon the discretion of your surgeon and anesthesiologist your surgery may need to be rescheduled.    __x__ 1. Do not eat food (including mints, candies, chewing gum) after midnight the night before your procedure. You may drink water up to 2 hours before you are scheduled to arrive at the hospital for your procedure.  Do not drink anything within 2 hours of your scheduled arrival to the hospital.    __x__ 2. No Alcohol for 24 hours before or after surgery.   __x__ 3. No Smoking or e-cigarettes for 24 hours before surgery.  Do not use any chewable tobacco products for at least 6 hours before surgery.   __x__ 4. Notify your doctor if there is any change in your medical condition (cold, fever, infections).   __x__ 5. On the morning of surgery brush your teeth with toothpaste and water.  You may rinse your mouth with mouthwash if you wish.  Do not swallow any toothpaste or mouthwash.  Please read over the following fact sheets that you were given:   [facility] Pre[facility] for [facility] and/or MRSA Information    __x__ Use CHG Soap as directed on instruction sheet.   Do not wear jewelry, lotions, powders, deodorant or perfumes on the day of surgery.  Do not shave below the face/neck 48 hours prior to surgery.   Do not bring valuables to the hospital.    [facility] is not responsible for any belongings or valuables.               Contacts, dentures or bridgework may not be worn into surgery.  For patients admitted to the hospital, discharge time is determined by your treatment  team.  For patients discharged on the day of surgery, you will NOT be permitted to drive yourself home.  You must have a responsible adult with you for 24 hours after surgery.  __x__ Take these medicines on the morning of surgery with a SMALL SIP OF WATER:  1. Amlodipine/Norvasc  2. Duloxetine/Cymbalta  3. Gabapentin/Neurontin  Skip your HCTZ only on the morning of surgery.  You do not need to stop taking it ahead of time.  __x__ Use inhalers (Symbicort) on the day of surgery and bring them with you to the hospital.  __x__ Stop Metformin and Janumet 2 days before surgery (Last dose today).    __x__ Follow recommendations from Cardiologist, Pulmonologist or PCP regarding stopping any blood thinners such as Aspirin, Coumadin, Plavix, Eliquis, Effient, Pradaxa, and Pletal.  __x__ TODAY: Do not take any Anti-inflammatories such as Advil, Ibuprofen, Motrin, Aleve, Naproxen, Naprosyn, BC/Goodies powders or aspirin products. You may continue to take Tylenol and Celebrex.   __x__ TODAY: Do not take any supplements until after surgery. You may continue to take Vitamin D, Vitamin B, and multivitamin.

## 2019-01-20 NOTE — Pre-Procedure Instructions (Signed)
CBC results sent to Dr. Genevive Bi and Anesthesia for review.

## 2019-01-22 ENCOUNTER — Inpatient Hospital Stay: Payer: BC Managed Care – PPO | Admitting: Anesthesiology

## 2019-01-22 ENCOUNTER — Encounter
Admission: RE | Disposition: A | Discharge status: Home / Self Care | Payer: Self-pay | Source: Home / Self Care | Attending: Cardiothoracic Surgery

## 2019-01-22 ENCOUNTER — Inpatient Hospital Stay: Payer: BC Managed Care – PPO

## 2019-01-22 ENCOUNTER — Other Ambulatory Visit: Payer: Self-pay

## 2019-01-22 ENCOUNTER — Inpatient Hospital Stay
Admission: RE | Admit: 2019-01-22 | Discharge: 2019-01-24 | DRG: 828 | Disposition: A | Discharge status: Home / Self Care | Payer: BC Managed Care – PPO | Attending: Cardiothoracic Surgery | Admitting: Cardiothoracic Surgery

## 2019-01-22 ENCOUNTER — Encounter: Payer: Self-pay | Admitting: *Deleted

## 2019-01-22 DIAGNOSIS — M7989 Other specified soft tissue disorders: Secondary | ICD-10-CM

## 2019-01-22 DIAGNOSIS — Z7984 Long term (current) use of oral hypoglycemic drugs: Secondary | ICD-10-CM

## 2019-01-22 DIAGNOSIS — R918 Other nonspecific abnormal finding of lung field: Secondary | ICD-10-CM

## 2019-01-22 DIAGNOSIS — R222 Localized swelling, mass and lump, trunk: Secondary | ICD-10-CM | POA: Diagnosis present

## 2019-01-22 DIAGNOSIS — Z881 Allergy status to other antibiotic agents status: Secondary | ICD-10-CM

## 2019-01-22 DIAGNOSIS — F329 Major depressive disorder, single episode, unspecified: Secondary | ICD-10-CM | POA: Diagnosis present

## 2019-01-22 DIAGNOSIS — Z885 Allergy status to narcotic agent status: Secondary | ICD-10-CM

## 2019-01-22 DIAGNOSIS — Z20828 Contact with and (suspected) exposure to other viral communicable diseases: Secondary | ICD-10-CM | POA: Diagnosis present

## 2019-01-22 DIAGNOSIS — Z09 Encounter for follow-up examination after completed treatment for conditions other than malignant neoplasm: Secondary | ICD-10-CM

## 2019-01-22 DIAGNOSIS — C7A1 Malignant poorly differentiated neuroendocrine tumors: Principal | ICD-10-CM | POA: Diagnosis present

## 2019-01-22 DIAGNOSIS — Z7951 Long term (current) use of inhaled steroids: Secondary | ICD-10-CM | POA: Diagnosis not present

## 2019-01-22 DIAGNOSIS — E119 Type 2 diabetes mellitus without complications: Secondary | ICD-10-CM | POA: Diagnosis present

## 2019-01-22 DIAGNOSIS — I10 Essential (primary) hypertension: Secondary | ICD-10-CM | POA: Diagnosis present

## 2019-01-22 DIAGNOSIS — J9859 Other diseases of mediastinum, not elsewhere classified: Secondary | ICD-10-CM | POA: Diagnosis present

## 2019-01-22 HISTORY — DX: Unspecified asthma, uncomplicated: J45.909

## 2019-01-22 HISTORY — PX: THORACOTOMY: SHX5074

## 2019-01-22 HISTORY — DX: Depression, unspecified: F32.A

## 2019-01-22 HISTORY — DX: Personal history of urinary calculi: Z87.442

## 2019-01-22 LAB — URINE DRUG SCREEN, QUALITATIVE (ARMC ONLY)
Amphetamines, Ur Screen: NOT DETECTED
Barbiturates, Ur Screen: NOT DETECTED
Benzodiazepine, Ur Scrn: NOT DETECTED
Cannabinoid 50 Ng, Ur ~~LOC~~: POSITIVE — AB
Cocaine Metabolite,Ur ~~LOC~~: NOT DETECTED
MDMA (Ecstasy)Ur Screen: NOT DETECTED
Methadone Scn, Ur: NOT DETECTED
Opiate, Ur Screen: NOT DETECTED
Phencyclidine (PCP) Ur S: NOT DETECTED
Tricyclic, Ur Screen: NOT DETECTED

## 2019-01-22 LAB — GLUCOSE, CAPILLARY
Glucose-Capillary: 230 mg/dL — ABNORMAL HIGH (ref 70–99)
Glucose-Capillary: 268 mg/dL — ABNORMAL HIGH (ref 70–99)
Glucose-Capillary: 291 mg/dL — ABNORMAL HIGH (ref 70–99)
Glucose-Capillary: 202 mg/dL — ABNORMAL HIGH (ref 70–99)
Glucose-Capillary: 255 mg/dL — ABNORMAL HIGH (ref 70–99)

## 2019-01-22 LAB — CBC
HCT: 26.7 % — ABNORMAL LOW (ref 39.0–52.0)
Hemoglobin: 8.1 g/dL — ABNORMAL LOW (ref 13.0–17.0)
MCH: 23.7 pg — ABNORMAL LOW (ref 26.0–34.0)
MCHC: 30.3 g/dL (ref 30.0–36.0)
MCV: 78.1 fL — ABNORMAL LOW (ref 80.0–100.0)
Platelets: 213 10*3/uL (ref 150–400)
RBC: 3.42 MIL/uL — ABNORMAL LOW (ref 4.22–5.81)
RDW: 18.2 % — ABNORMAL HIGH (ref 11.5–15.5)
WBC: 11.1 10*3/uL — ABNORMAL HIGH (ref 4.0–10.5)
nRBC: 0 % (ref 0.0–0.2)

## 2019-01-22 LAB — BASIC METABOLIC PANEL
Anion gap: 7 (ref 5–15)
BUN: 12 mg/dL (ref 8–23)
CO2: 25 mmol/L (ref 22–32)
Calcium: 8.4 mg/dL — ABNORMAL LOW (ref 8.9–10.3)
Chloride: 104 mmol/L (ref 98–111)
Creatinine, Ser: 1.05 mg/dL (ref 0.61–1.24)
GFR calc Af Amer: >60 mL/min (ref >60)
GFR calc non Af Amer: >60 mL/min (ref >60)
Glucose, Bld: 279 mg/dL — ABNORMAL HIGH (ref 70–99)
Potassium: 3.5 mmol/L (ref 3.5–5.1)
Sodium: 136 mmol/L (ref 135–145)

## 2019-01-22 LAB — HEMOGLOBIN A1C
Hgb A1c MFr Bld: 7.2 % — ABNORMAL HIGH (ref 4.8–5.6)
Mean Plasma Glucose: 159.94 mg/dL

## 2019-01-22 LAB — ABO/RH: ABO/RH(D): A POS

## 2019-01-22 SURGERY — THORACOTOMY, MAJOR
Anesthesia: General | Laterality: Left

## 2019-01-22 MED ORDER — PRAZOSIN HCL 1 MG PO CAPS
1.0000 mg | ORAL_CAPSULE | Freq: Every day | ORAL | Status: DC
  Administered 2019-01-22 – 2019-01-23 (×2): 1 mg via ORAL
  Filled 2019-01-22 (×3): qty 1

## 2019-01-22 MED ORDER — FENTANYL CITRATE (PF) 100 MCG/2ML IJ SOLN
INTRAMUSCULAR | Status: DC | PRN
  Administered 2019-01-22: 100 ug via INTRAVENOUS
  Administered 2019-01-22: 50 ug via INTRAVENOUS

## 2019-01-22 MED ORDER — CHLORHEXIDINE GLUCONATE CLOTH 2 % EX PADS: 6.0000 | MEDICATED_PAD | Freq: Once | CUTANEOUS | Status: DC

## 2019-01-22 MED ORDER — LIDOCAINE HCL 4 % MT SOLN
OROMUCOSAL | Status: DC | PRN
  Administered 2019-01-22: 4 mL via TOPICAL

## 2019-01-22 MED ORDER — PROPOFOL 10 MG/ML IV BOLUS
INTRAVENOUS | Status: AC
  Filled 2019-01-22: qty 20

## 2019-01-22 MED ORDER — AMLODIPINE BESYLATE 10 MG PO TABS
10.0000 mg | ORAL_TABLET | Freq: Every day | ORAL | Status: DC
  Administered 2019-01-23 – 2019-01-24 (×2): 10 mg via ORAL
  Filled 2019-01-22 (×2): qty 1

## 2019-01-22 MED ORDER — FENTANYL CITRATE (PF) 250 MCG/5ML IJ SOLN
INTRAMUSCULAR | Status: AC
  Filled 2019-01-22: qty 5

## 2019-01-22 MED ORDER — SODIUM CHLORIDE (PF) 0.9 % IJ SOLN
INTRAMUSCULAR | Status: AC
  Filled 2019-01-22: qty 20

## 2019-01-22 MED ORDER — SUGAMMADEX SODIUM 200 MG/2ML IV SOLN
INTRAVENOUS | Status: DC | PRN
  Administered 2019-01-22: 200 mg via INTRAVENOUS

## 2019-01-22 MED ORDER — BUPIVACAINE HCL (PF) 0.25 % IJ SOLN
INTRAMUSCULAR | Status: AC
  Filled 2019-01-22: qty 30

## 2019-01-22 MED ORDER — ROCURONIUM BROMIDE 50 MG/5ML IV SOLN
INTRAVENOUS | Status: AC
  Filled 2019-01-22: qty 1

## 2019-01-22 MED ORDER — ACETAMINOPHEN 325 MG PO TABS: 325.0000 mg | ORAL_TABLET | ORAL | Status: DC | PRN

## 2019-01-22 MED ORDER — EPHEDRINE SULFATE 50 MG/ML IJ SOLN
INTRAMUSCULAR | Status: AC
  Filled 2019-01-22: qty 1

## 2019-01-22 MED ORDER — ACETAMINOPHEN 10 MG/ML IV SOLN
INTRAVENOUS | Status: DC | PRN
  Administered 2019-01-22: 1000 mg via INTRAVENOUS

## 2019-01-22 MED ORDER — SITAGLIPTIN PHOSPHATE 25 MG PO TABS
25.0000 mg | ORAL_TABLET | Freq: Every day | ORAL | Status: DC
  Administered 2019-01-22 – 2019-01-24 (×3): 25 mg via ORAL
  Filled 2019-01-22 (×4): qty 1

## 2019-01-22 MED ORDER — ONDANSETRON HCL 4 MG/2ML IJ SOLN
INTRAMUSCULAR | Status: AC
  Filled 2019-01-22: qty 2

## 2019-01-22 MED ORDER — DEXTROSE-NACL 5-0.45 % IV SOLN
INTRAVENOUS | Status: DC
  Administered 2019-01-22: 15:00:00 via INTRAVENOUS

## 2019-01-22 MED ORDER — DEXAMETHASONE SODIUM PHOSPHATE 10 MG/ML IJ SOLN
INTRAMUSCULAR | Status: AC
  Filled 2019-01-22: qty 1

## 2019-01-22 MED ORDER — PHENYLEPHRINE HCL (PRESSORS) 10 MG/ML IV SOLN
INTRAVENOUS | Status: DC | PRN
  Administered 2019-01-22 (×3): 100 ug via INTRAVENOUS

## 2019-01-22 MED ORDER — SODIUM CHLORIDE 0.45 % IV SOLN
INTRAVENOUS | Status: DC
  Administered 2019-01-22 – 2019-01-23 (×2): via INTRAVENOUS

## 2019-01-22 MED ORDER — ROCURONIUM BROMIDE 100 MG/10ML IV SOLN
INTRAVENOUS | Status: DC | PRN
  Administered 2019-01-22: 5 mg via INTRAVENOUS
  Administered 2019-01-22: 40 mg via INTRAVENOUS

## 2019-01-22 MED ORDER — ACETAMINOPHEN 10 MG/ML IV SOLN
INTRAVENOUS | Status: AC
  Filled 2019-01-22: qty 100

## 2019-01-22 MED ORDER — TRAMADOL HCL 50 MG PO TABS
50.0000 mg | ORAL_TABLET | Freq: Four times a day (QID) | ORAL | Status: DC
  Administered 2019-01-22: 50 mg via ORAL
  Administered 2019-01-22 – 2019-01-23 (×3): 100 mg via ORAL
  Filled 2019-01-22: qty 2
  Filled 2019-01-22: qty 1
  Filled 2019-01-22: qty 2
  Filled 2019-01-22 (×2): qty 1

## 2019-01-22 MED ORDER — VANCOMYCIN HCL 500 MG IV SOLR
500.0000 mg | Freq: Two times a day (BID) | INTRAVENOUS | Status: AC
  Administered 2019-01-22: 500 mg via INTRAVENOUS
  Filled 2019-01-22: qty 500

## 2019-01-22 MED ORDER — VASOPRESSIN 20 UNIT/ML IV SOLN
INTRAVENOUS | Status: DC | PRN
  Administered 2019-01-22: 1 [IU] via INTRAVENOUS
  Administered 2019-01-22: 2 [IU] via INTRAVENOUS
  Administered 2019-01-22 (×2): 1 [IU] via INTRAVENOUS
  Administered 2019-01-22: 2 [IU] via INTRAVENOUS
  Administered 2019-01-22: 1 [IU] via INTRAVENOUS
  Administered 2019-01-22: 2 [IU] via INTRAVENOUS
  Administered 2019-01-22: 1 [IU] via INTRAVENOUS
  Administered 2019-01-22: 2 [IU] via INTRAVENOUS

## 2019-01-22 MED ORDER — FENTANYL CITRATE (PF) 100 MCG/2ML IJ SOLN
25.0000 ug | INTRAMUSCULAR | Status: DC | PRN
  Administered 2019-01-22: 25 ug via INTRAVENOUS

## 2019-01-22 MED ORDER — MORPHINE SULFATE (PF) 2 MG/ML IV SOLN
1.0000 mg | INTRAVENOUS | Status: DC | PRN
  Administered 2019-01-23: 2 mg via INTRAVENOUS
  Filled 2019-01-22: qty 1

## 2019-01-22 MED ORDER — VANCOMYCIN HCL IN DEXTROSE 1-5 GM/200ML-% IV SOLN
INTRAVENOUS | Status: AC
  Administered 2019-01-22: 1000 mg via INTRAVENOUS
  Filled 2019-01-22: qty 200

## 2019-01-22 MED ORDER — MIDAZOLAM HCL 2 MG/2ML IJ SOLN
INTRAMUSCULAR | Status: AC
  Filled 2019-01-22: qty 2

## 2019-01-22 MED ORDER — INSULIN ASPART 100 UNIT/ML ~~LOC~~ SOLN
8.0000 [IU] | Freq: Once | SUBCUTANEOUS | Status: AC
  Administered 2019-01-22: 8 [IU] via SUBCUTANEOUS

## 2019-01-22 MED ORDER — VANCOMYCIN HCL IN DEXTROSE 1-5 GM/200ML-% IV SOLN
1000.0000 mg | INTRAVENOUS | Status: AC
  Administered 2019-01-22: 10:00:00 1000 mg via INTRAVENOUS

## 2019-01-22 MED ORDER — INSULIN ASPART 100 UNIT/ML ~~LOC~~ SOLN
SUBCUTANEOUS | Status: AC
  Filled 2019-01-22: qty 1

## 2019-01-22 MED ORDER — LIDOCAINE HCL (PF) 2 % IJ SOLN
INTRAMUSCULAR | Status: AC
  Filled 2019-01-22: qty 10

## 2019-01-22 MED ORDER — BISACODYL 5 MG PO TBEC
10.0000 mg | DELAYED_RELEASE_TABLET | Freq: Every day | ORAL | Status: DC
  Administered 2019-01-22 – 2019-01-24 (×3): 10 mg via ORAL
  Filled 2019-01-22 (×3): qty 2

## 2019-01-22 MED ORDER — ONDANSETRON HCL 4 MG/2ML IJ SOLN: 4.0000 mg | Freq: Four times a day (QID) | INTRAMUSCULAR | Status: DC | PRN

## 2019-01-22 MED ORDER — OXYCODONE HCL 5 MG PO TABS
5.0000 mg | ORAL_TABLET | ORAL | Status: DC | PRN
  Administered 2019-01-22: 10 mg via ORAL
  Administered 2019-01-23: 5 mg via ORAL
  Filled 2019-01-22: qty 1
  Filled 2019-01-22: qty 2

## 2019-01-22 MED ORDER — PHENYLEPHRINE HCL (PRESSORS) 10 MG/ML IV SOLN
INTRAVENOUS | Status: AC
  Filled 2019-01-22: qty 1

## 2019-01-22 MED ORDER — SODIUM CHLORIDE 0.9 % IV SOLN
INTRAVENOUS | Status: DC
  Administered 2019-01-22: 10:00:00 via INTRAVENOUS

## 2019-01-22 MED ORDER — FAMOTIDINE 20 MG PO TABS
20.0000 mg | ORAL_TABLET | Freq: Once | ORAL | Status: AC
  Administered 2019-01-22: 20 mg via ORAL

## 2019-01-22 MED ORDER — PROMETHAZINE HCL 25 MG/ML IJ SOLN: 6.2500 mg | INTRAMUSCULAR | Status: DC | PRN

## 2019-01-22 MED ORDER — INSULIN ASPART 100 UNIT/ML ~~LOC~~ SOLN
0.0000 [IU] | SUBCUTANEOUS | Status: DC
  Administered 2019-01-22: 8 [IU] via SUBCUTANEOUS
  Administered 2019-01-22 – 2019-01-23 (×2): 5 [IU] via SUBCUTANEOUS
  Administered 2019-01-23: 2 [IU] via SUBCUTANEOUS
  Administered 2019-01-23 (×3): 3 [IU] via SUBCUTANEOUS
  Administered 2019-01-23: 2 [IU] via SUBCUTANEOUS
  Administered 2019-01-24: 3 [IU] via SUBCUTANEOUS
  Filled 2019-01-22 (×9): qty 1

## 2019-01-22 MED ORDER — GABAPENTIN 600 MG PO TABS: 600.0000 mg | ORAL_TABLET | Freq: Four times a day (QID) | ORAL | Status: DC | PRN

## 2019-01-22 MED ORDER — SUGAMMADEX SODIUM 200 MG/2ML IV SOLN
INTRAVENOUS | Status: AC
  Filled 2019-01-22: qty 2

## 2019-01-22 MED ORDER — LIDOCAINE HCL (CARDIAC) PF 100 MG/5ML IV SOSY
PREFILLED_SYRINGE | INTRAVENOUS | Status: DC | PRN
  Administered 2019-01-22: 70 mg via INTRAVENOUS

## 2019-01-22 MED ORDER — DULOXETINE HCL 30 MG PO CPEP
30.0000 mg | ORAL_CAPSULE | Freq: Every day | ORAL | Status: DC
  Administered 2019-01-23 – 2019-01-24 (×2): 30 mg via ORAL
  Filled 2019-01-22 (×2): qty 1

## 2019-01-22 MED ORDER — HYDROCHLOROTHIAZIDE 25 MG PO TABS
25.0000 mg | ORAL_TABLET | Freq: Every day | ORAL | Status: DC
  Administered 2019-01-23 – 2019-01-24 (×2): 25 mg via ORAL
  Filled 2019-01-22 (×2): qty 1

## 2019-01-22 MED ORDER — FAMOTIDINE 20 MG PO TABS
ORAL_TABLET | ORAL | Status: AC
  Filled 2019-01-22: qty 1

## 2019-01-22 MED ORDER — MIDAZOLAM HCL 2 MG/2ML IJ SOLN
INTRAMUSCULAR | Status: DC | PRN
  Administered 2019-01-22: 2 mg via INTRAVENOUS

## 2019-01-22 MED ORDER — ONDANSETRON HCL 4 MG/2ML IJ SOLN
INTRAMUSCULAR | Status: DC | PRN
  Administered 2019-01-22: 4 mg via INTRAVENOUS

## 2019-01-22 MED ORDER — MEPERIDINE HCL 50 MG/ML IJ SOLN: 6.2500 mg | INTRAMUSCULAR | Status: DC | PRN

## 2019-01-22 MED ORDER — ACETAMINOPHEN 160 MG/5ML PO SOLN
325.0000 mg | ORAL | Status: DC | PRN
  Filled 2019-01-22: qty 20.3

## 2019-01-22 MED ORDER — BUPIVACAINE HCL 0.25 % IJ SOLN
INTRAMUSCULAR | Status: DC | PRN
  Administered 2019-01-22: 30 mL

## 2019-01-22 MED ORDER — DEXAMETHASONE SODIUM PHOSPHATE 10 MG/ML IJ SOLN
INTRAMUSCULAR | Status: DC | PRN
  Administered 2019-01-22: 10 mg via INTRAVENOUS

## 2019-01-22 MED ORDER — EPHEDRINE SULFATE 50 MG/ML IJ SOLN
INTRAMUSCULAR | Status: DC | PRN
  Administered 2019-01-22: 10 mg via INTRAVENOUS
  Administered 2019-01-22 (×2): 15 mg via INTRAVENOUS

## 2019-01-22 MED ORDER — FENTANYL CITRATE (PF) 100 MCG/2ML IJ SOLN
INTRAMUSCULAR | Status: AC
  Filled 2019-01-22: qty 2

## 2019-01-22 MED ORDER — MOMETASONE FURO-FORMOTEROL FUM 100-5 MCG/ACT IN AERO
2.0000 | INHALATION_SPRAY | Freq: Every day | RESPIRATORY_TRACT | Status: DC
  Administered 2019-01-23 – 2019-01-24 (×2): 2 via RESPIRATORY_TRACT
  Filled 2019-01-22: qty 8.8

## 2019-01-22 MED ORDER — BUPIVACAINE LIPOSOME 1.3 % IJ SUSP
INTRAMUSCULAR | Status: DC | PRN
  Administered 2019-01-22: 20 mL

## 2019-01-22 MED ORDER — PROPOFOL 10 MG/ML IV BOLUS
INTRAVENOUS | Status: DC | PRN
  Administered 2019-01-22: 140 mg via INTRAVENOUS

## 2019-01-22 MED ORDER — BUPIVACAINE LIPOSOME 1.3 % IJ SUSP
INTRAMUSCULAR | Status: AC
  Filled 2019-01-22: qty 20

## 2019-01-22 SURGICAL SUPPLY — 97 items
BAG DECANTER FOR FLEXI CONT (MISCELLANEOUS) ×1 IMPLANT
BENZOIN TINCTURE PRP APPL 2/3 (GAUZE/BANDAGES/DRESSINGS) ×1 IMPLANT
BLADE SURG 15 STRL LF DISP TIS (BLADE) ×1 IMPLANT
BLADE SURG 15 STRL SS (BLADE) ×1
BLADE SURG SZ11 CARB STEEL (BLADE) ×1 IMPLANT
BNDG COHESIVE 4X5 TAN STRL (GAUZE/BANDAGES/DRESSINGS) IMPLANT
BRONCHOSCOPE PED SLIM DISP (MISCELLANEOUS) ×2 IMPLANT
CANISTER SUCT 1200ML W/VALVE (MISCELLANEOUS) ×2 IMPLANT
CATH URET ROBINSON 16FR STRL (CATHETERS) ×1 IMPLANT
CHLORAPREP W/TINT 26 (MISCELLANEOUS) ×3 IMPLANT
CNTNR SPEC 2.5X3XGRAD LEK (MISCELLANEOUS) ×1
CONN REDUCER 1/4X3/8 STR (CONNECTOR) ×2
CONNECTOR REDUCER 1/4X3/8 STR (CONNECTOR) IMPLANT
CONT SPEC 4OZ STER OR WHT (MISCELLANEOUS) ×1
CONTAINER SPEC 2.5X3XGRAD LEK (MISCELLANEOUS) ×4 IMPLANT
COVER LIGHT HANDLE STERIS (MISCELLANEOUS) ×2 IMPLANT
CUTTER ECHEON FLEX ENDO 45 340 (ENDOMECHANICALS) IMPLANT
DRAIN CHANNEL 19F RND (DRAIN) ×1 IMPLANT
DRAIN CHEST DRY SUCT SGL (MISCELLANEOUS) ×2 IMPLANT
DRAIN CONNECTOR BLAKE 1:1 (MISCELLANEOUS) ×1 IMPLANT
DRAPE C-ARM XRAY 36X54 (DRAPES) ×2 IMPLANT
DRAPE C-SECTION (MISCELLANEOUS) ×2 IMPLANT
DRAPE MAG INST 16X20 L/F (DRAPES) ×1 IMPLANT
DRSG OPSITE POSTOP 4X6 (GAUZE/BANDAGES/DRESSINGS) ×1 IMPLANT
DRSG OPSITE POSTOP 4X8 (GAUZE/BANDAGES/DRESSINGS) ×1 IMPLANT
DRSG TEGADERM 2-3/8X2-3/4 SM (GAUZE/BANDAGES/DRESSINGS) ×2 IMPLANT
DRSG TEGADERM 4X4.75 (GAUZE/BANDAGES/DRESSINGS) ×2 IMPLANT
DRSG TELFA 3X8 NADH (GAUZE/BANDAGES/DRESSINGS) ×2 IMPLANT
DRSG TELFA 4X3 1S NADH ST (GAUZE/BANDAGES/DRESSINGS) ×2 IMPLANT
ELECT BLADE 6.5 EXT (BLADE) ×1 IMPLANT
ELECT CAUTERY BLADE TIP 2.5 (TIP) ×2
ELECT REM PT RETURN 9FT ADLT (ELECTROSURGICAL) ×2
ELECTRODE CAUTERY BLDE TIP 2.5 (TIP) ×1 IMPLANT
ELECTRODE REM PT RTRN 9FT ADLT (ELECTROSURGICAL) ×1 IMPLANT
GAUZE SPONGE 4X4 12PLY STRL (GAUZE/BANDAGES/DRESSINGS) ×2 IMPLANT
GLOVE SURG SYN 7.5  E (GLOVE) ×2
GLOVE SURG SYN 7.5 E (GLOVE) ×2 IMPLANT
GLOVE SURG SYN 7.5 PF PI (GLOVE) ×2 IMPLANT
GOWN STRL REUS W/ TWL LRG LVL3 (GOWN DISPOSABLE) ×3 IMPLANT
GOWN STRL REUS W/TWL LRG LVL3 (GOWN DISPOSABLE) ×3
IV NS 500ML (IV SOLUTION)
IV NS 500ML BAXH (IV SOLUTION) ×1 IMPLANT
KIT TURNOVER KIT A (KITS) ×2 IMPLANT
LABEL OR SOLS (LABEL) ×2 IMPLANT
LOOP RED MAXI  1X406MM (MISCELLANEOUS) ×1
LOOP VESSEL MAXI 1X406 RED (MISCELLANEOUS) ×1 IMPLANT
MARKER SKIN DUAL TIP RULER LAB (MISCELLANEOUS) ×2 IMPLANT
NDL FILTER BLUNT 18X1 1/2 (NEEDLE) ×1 IMPLANT
NDL SPNL 22GX3.5 QUINCKE BK (NEEDLE) ×1 IMPLANT
NEEDLE FILTER BLUNT 18X 1/2SAF (NEEDLE)
NEEDLE FILTER BLUNT 18X1 1/2 (NEEDLE) IMPLANT
NEEDLE HYPO 22GX1.5 SAFETY (NEEDLE) ×4 IMPLANT
NEEDLE SPNL 22GX3.5 QUINCKE BK (NEEDLE) IMPLANT
NS IRRIG 500ML POUR BTL (IV SOLUTION) ×1 IMPLANT
PACK BASIN MAJOR ARMC (MISCELLANEOUS) ×2 IMPLANT
PACK PORT-A-CATH (MISCELLANEOUS) ×1 IMPLANT
PAD DRESSING TELFA 3X8 NADH (GAUZE/BANDAGES/DRESSINGS) ×1 IMPLANT
RELOAD PROXIMATE TA60MM GREEN (ENDOMECHANICALS) IMPLANT
RELOAD STAPLE 35X2.5 WHT THIN (STAPLE) ×4 IMPLANT
RELOAD STAPLE 60 4.7 GRN THCK (ENDOMECHANICALS) IMPLANT
RELOAD STAPLER LINE PROX 30 GR (STAPLE) IMPLANT
SPONGE KITTNER 5P (MISCELLANEOUS) ×2 IMPLANT
STAPLE RELOAD 2.5MM WHITE (STAPLE) ×2 IMPLANT
STAPLER RELOAD LINE PROX 30 GR (STAPLE)
STAPLER RELOADABLE 30 GRN THCK (STAPLE) ×1 IMPLANT
STAPLER SKIN PROX 35W (STAPLE) ×2 IMPLANT
STAPLER VASCULAR ECHELON 35 (CUTTER) IMPLANT
STRIP CLOSURE SKIN 1/2X4 (GAUZE/BANDAGES/DRESSINGS) ×2 IMPLANT
STRIP CLOSURE SKIN 1/4X4 (GAUZE/BANDAGES/DRESSINGS) IMPLANT
SUT ETHILON 4-0 (SUTURE)
SUT ETHILON 4-0 FS2 18XMFL BLK (SUTURE)
SUT MNCRL AB 3-0 PS2 27 (SUTURE) IMPLANT
SUT PROLENE 2 0 SH DA (SUTURE) ×2 IMPLANT
SUT PROLENE 5 0 RB 1 DA (SUTURE) IMPLANT
SUT SILK 0 (SUTURE) ×1
SUT SILK 0 30XBRD TIE 6 (SUTURE) ×1 IMPLANT
SUT SILK 1 SH (SUTURE) ×13 IMPLANT
SUT VIC AB 0 CT1 36 (SUTURE) ×4 IMPLANT
SUT VIC AB 2-0 CT1 27 (SUTURE) ×2
SUT VIC AB 2-0 CT1 TAPERPNT 27 (SUTURE) ×2 IMPLANT
SUT VIC AB 2-0 SH 27 (SUTURE) ×1
SUT VIC AB 2-0 SH 27XBRD (SUTURE) ×1 IMPLANT
SUT VIC AB 3-0 SH 27 (SUTURE)
SUT VIC AB 3-0 SH 27X BRD (SUTURE) ×1 IMPLANT
SUT VICRYL 2 TP 1 (SUTURE) ×3 IMPLANT
SUTURE ETHLN 4-0 FS2 18XMF BLK (SUTURE) ×2 IMPLANT
SYR 10ML SLIP (SYRINGE) ×1 IMPLANT
SYR 3ML LL SCALE MARK (SYRINGE) ×1 IMPLANT
SYR BULB IRRIG 60ML STRL (SYRINGE) ×2 IMPLANT
TAPE CLOTH 3X10 WHT NS LF (GAUZE/BANDAGES/DRESSINGS) ×1 IMPLANT
TAPE TRANSPORE STRL 2 31045 (GAUZE/BANDAGES/DRESSINGS) ×1 IMPLANT
TRAY FOLEY MTR SLVR 16FR STAT (SET/KITS/TRAYS/PACK) ×2 IMPLANT
TROCAR FLEXIPATH 20X80 (ENDOMECHANICALS) IMPLANT
TROCAR FLEXIPATH THORACIC 15MM (ENDOMECHANICALS) IMPLANT
TUBING CONNECTING 10 (TUBING) ×1 IMPLANT
WATER STERILE IRR 1000ML POUR (IV SOLUTION) ×2 IMPLANT
YANKAUER SUCT BULB TIP FLEX NO (MISCELLANEOUS) ×2 IMPLANT

## 2019-01-22 NOTE — Progress Notes (Signed)
Pt CBG Recheck: 291. Dr. Lavone Neri notified. Acknowledged. No new orders. Stated pt ok to go to his room. To have floor RN recheck CBG in 1 hour.

## 2019-01-22 NOTE — Transfer of Care (Signed)
Immediate Anesthesia Transfer of Care Note  Patient: Kevin Mckay  Procedure(s) Performed: PRE OP BRONCH LEFT ANTERIOR THORACOTOMY WITH BIOPSY OF MEDIASTINAL MASS (Left )  Patient Location: PACU  Anesthesia Type:General  Level of Consciousness: drowsy  Airway & Oxygen Therapy: Patient Spontanous Breathing and Patient connected to face mask oxygen  Post-op Assessment: Report given to RN and Post -op Vital signs reviewed and stable  Post vital signs: Reviewed and stable  Last Vitals:  Vitals Value Taken Time  BP 127/58 01/22/19 1300  Temp 35.9 C 01/22/19 1300  Pulse 60 01/22/19 1301  Resp 11 01/22/19 1301  SpO2 100 % 01/22/19 1301  Vitals shown include unvalidated device data.  Last Pain:  Vitals:   01/22/19 1300  TempSrc:   PainSc: Asleep         Complications: No apparent anesthesia complications

## 2019-01-22 NOTE — Anesthesia Postprocedure Evaluation (Signed)
Anesthesia Post Note  Patient: Rondarius Kadrmas  Procedure(s) Performed: PRE OP BRONCH LEFT ANTERIOR THORACOTOMY WITH BIOPSY OF MEDIASTINAL MASS (Left )  Patient location during evaluation: PACU Anesthesia Type: General Level of consciousness: awake and alert Pain management: pain level controlled Vital Signs Assessment: post-procedure vital signs reviewed and stable Respiratory status: spontaneous breathing, nonlabored ventilation, respiratory function stable and patient connected to nasal cannula oxygen Cardiovascular status: blood pressure returned to baseline and stable Postop Assessment: no apparent nausea or vomiting Anesthetic complications: no     Last Vitals:  Vitals:   01/22/19 1414 01/22/19 1446  BP: (!) 105/55 (!) 106/55  Pulse: 65 63  Resp:    Temp: (!) 36.3 C (!) 36.4 C  SpO2: 96% 96%    Last Pain:  Vitals:   01/22/19 1446  TempSrc: Oral  PainSc:                  Alphonsus Sias

## 2019-01-22 NOTE — Progress Notes (Signed)
CBG: 268. Dr. Lavone Neri notified. Acknowledged. Orders received. See MAR.

## 2019-01-22 NOTE — Anesthesia Preprocedure Evaluation (Signed)
Anesthesia Evaluation  Patient identified by MRN, date of birth, ID band Patient awake    Reviewed: Allergy & Precautions, H&P , NPO status , reviewed documented beta blocker date and time   Airway Mallampati: III  TM Distance: >3 FB Neck ROM: full    Dental  (+) Poor Dentition, Chipped, Missing   Pulmonary asthma , Current Smoker,    Pulmonary exam normal        Cardiovascular hypertension, Normal cardiovascular exam     Neuro/Psych PSYCHIATRIC DISORDERS Depression    GI/Hepatic   Endo/Other  diabetes  Renal/GU      Musculoskeletal   Abdominal   Peds  Hematology   Anesthesia Other Findings Past Medical History: No date: Asthma No date: Cerebral aneurysm No date: Chronic painful diabetic neuropathy ([place]) No date: Depression No date: Diabetes mellitus without complication ([place]) No date: Essential hypertension No date: History of kidney stones No date: Occasional tremors No date: Tobacco use Past Surgical History: No date: ABDOMINAL SURGERY     Comment:  age 68. trauma surgery due to forklift injury- liver and              spleen 05/31/2016: IR GENERIC HISTORICAL     Comment:  IR ANGIO VERTEBRAL SEL VERTEBRAL BILAT MOD SED 05/31/2016              Consuella Lose, MD [facility]INTER[facility] RAD 05/31/2016: IR GENERIC HISTORICAL     Comment:  IR ANGIO INTRA EXTRACRAN SEL INTERNAL CAROTID BILAT MOD               SED 05/31/2016 Consuella Lose, MD [facility] BMI    Body Mass Index: 23.20 kg/m     Reproductive/Obstetrics                             Anesthesia Physical Anesthesia Plan  ASA: III  Anesthesia Plan: General   Post-op Pain Management:    Induction: Intravenous  PONV Risk Score and Plan: Ondansetron and Treatment may vary due to age or medical condition  Airway Management Planned: Oral ETT  Additional Equipment:   Intra-op Plan:   Post-operative Plan: Extubation in  OR  Informed Consent: I have reviewed the patients History and Physical, chart, labs and discussed the procedure including the risks, benefits and alternatives for the proposed anesthesia with the patient or authorized representative who has indicated his/her understanding and acceptance.     Dental Advisory Given  Plan Discussed with: CRNA  Anesthesia Plan Comments:         Anesthesia Quick Evaluation

## 2019-01-22 NOTE — Progress Notes (Signed)
Inpatient Diabetes Program Recommendations  AACE/ADA: New Consensus Statement on Inpatient Glycemic Control  Target Ranges:  Prepandial:   less than 140 mg/dL      Peak postprandial:   less than 180 mg/dL (1-2 hours)      Critically ill patients:  140 - 180 mg/dL   Results for Kevin Mckay, Kevin Mckay (MRN [id number]) as of 01/22/2019 14:24  Ref. Range 01/22/2019 10:01 01/22/2019 13:01 01/22/2019 13:42  Glucose-Capillary Latest Ref Range: 70 - 99 mg/dL 230 (H)    Decadron 10 mg @11 :31 268 (H)  Novolog 8 units @13 :26 291 (H)   Review of Glycemic Control  Diabetes history: DM2 Outpatient Diabetes medications: Glipizide XL 10 mg QAM, Metformin 1000 mg BID, Januvia 25 mg daily Current orders for Inpatient glycemic control: Januvia 25 mg daily  Inpatient Diabetes Program Recommendations:   Correction (SSI): While inpatient, please consider ordering CBGs Q4H with Novolog 0-15 units Q4H.  HbgA1C: Please consider ordering an A1C to evaluate glycemic control over the past 2-3 months.  NOTE: Noted patient received Decadron 10 mg @ 11:31 am today which is contributing to hyperglycemia. Patient received Novolog 8 units at 13:26 today but no other orders for insulin noted.  Thanks, Barnie Alderman, RN, MSN, CDE Diabetes Coordinator Inpatient Diabetes Program [phone] (Team Pager from 8am to 5pm)

## 2019-01-22 NOTE — OR Nursing (Signed)
[place] drain connected to [place] drain

## 2019-01-22 NOTE — Anesthesia Post-op Follow-up Note (Signed)
Anesthesia QCDR form completed.        

## 2019-01-22 NOTE — Op Note (Signed)
  01/22/2019  1:12 PM  PATIENT:  Kevin Mckay  68 y.o. 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 Marlis Edelson whose skill and knowledge were required for the operation.  No other assistants were available  ANESTHESIA: General  INDICATIONS FOR PROCEDURE this patient is a 68 year old male with a 4 cm mass within the AP window.  Because of its location it was not amenable to percutaneous biopsy and he was apprised of the indications and risks of left anterior thoracotomy with biopsy of the mediastinal mass.  We also discussed the possibility of placing a Port-A-Cath if the pathology was obviously a small cell carcinoma.  Intraoperatively the frozen section was most consistent with a lymphoma but could not be further characterized and definitive diagnosis was deferred.  Therefore we did not put in the Port-A-Cath.  DICTATION: Patient was brought to the operating suite placed in the supine position.  General endotracheal anesthesia was given with a double-lumen tube.  Preoperative bronchoscopy confirmed the presence of the tube to be in the proper position.  No endobronchial tumors were identified.  The patient was then prepped and draped in usual sterile fashion.  A skin incision measuring approximately 5 cm long was made over the second rib.  The incision was deepened down through the muscles of the chest wall until the pleural space was entered.  We excised this portion of the second rib for distance of about 2 to 3 cm giving [place] better access to the anterior mediastinum and aortopulmonary window.  Palpation this area revealed a soft fleshy mass underneath the mediastinal pleura.  The phrenic nerve was  running anterior to the mass.  The lung was stuck to it and was pulled back.  The mediastinal pleura was opened and multiple biopsies were taken of the mass.  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  Hemostasis was complete and after additional sections were taken for permanents a [place] drain was placed in the pleural space and brought out through a separate stab wound.  Hemostasis was again complete throughout and the chest was then closed.  The muscles of the chest wall were reapproximated with 0 Vicryl.  The subcutaneous tissues were closed in multiple layers running absorbable sutures upon the skin was closed with a running absorbable suture.  Sterile dressings were applied.  All sponge needle and instrument counts were correct as reported to me at the end of the case.  The patient was then transported to the recovery room in stable condition.   [name], MD

## 2019-01-22 NOTE — Interval H&P Note (Signed)
History and Physical Interval Note:  01/22/2019 10:41 AM  Kevin Mckay  has presented today for surgery, with the diagnosis of MEDIASTINAL MASS.  The various methods of treatment have been discussed with the patient and family. After consideration of risks, benefits and other options for treatment, the patient has consented to  Procedure(s): PRE OP BRONCH LEFT ANTERIOR THORACOTOMY WITH BIOPSY OF MEDIASTINAL MASS (Left) INSERTION PORT-A-CATH, LEFT (Left) as a surgical intervention.  The patient's history has been reviewed, patient examined, no change in status, stable for surgery.  I have reviewed the patient's chart and labs.  Questions were answered to the patient's satisfaction.     [name]

## 2019-01-22 NOTE — Anesthesia Procedure Notes (Signed)
Procedure Name: Intubation Date/Time: 01/22/2019 11:03 AM Performed by: Eben Burow, CRNA Pre-anesthesia Checklist: Patient identified, Emergency Drugs available, Suction available and Patient being monitored Patient Re-evaluated:Patient Re-evaluated prior to induction Oxygen Delivery Method: Circle system utilized Preoxygenation: Pre-oxygenation with 100% oxygen Induction Type: IV induction Ventilation: Mask ventilation without difficulty Laryngoscope Size: McGraph and 4 Grade View: Grade I Endobronchial tube: Left, EBT position confirmed by fiberoptic bronchoscope and Double lumen EBT and 39 Fr Number of attempts: 1 Airway Equipment and Method: Stylet,  Video-laryngoscopy and LTA kit utilized Placement Confirmation: positive ETCO2 and breath sounds checked- equal and bilateral Tube secured with: Tape Dental Injury: Teeth and Oropharynx as per pre-operative assessment

## 2019-01-23 ENCOUNTER — Inpatient Hospital Stay: Payer: BC Managed Care – PPO

## 2019-01-23 LAB — TYPE AND SCREEN
ABO/RH(D): A POS
Antibody Screen: NEGATIVE
Unit division: 0
Unit division: 0

## 2019-01-23 LAB — BPAM RBC
Blood Product Expiration Date: 202008132359
Blood Product Expiration Date: 202008222359
Unit Type and Rh: 6200
Unit Type and Rh: 6200

## 2019-01-23 LAB — GLUCOSE, CAPILLARY
Glucose-Capillary: 139 mg/dL — ABNORMAL HIGH (ref 70–99)
Glucose-Capillary: 144 mg/dL — ABNORMAL HIGH (ref 70–99)
Glucose-Capillary: 159 mg/dL — ABNORMAL HIGH (ref 70–99)
Glucose-Capillary: 183 mg/dL — ABNORMAL HIGH (ref 70–99)
Glucose-Capillary: 197 mg/dL — ABNORMAL HIGH (ref 70–99)
Glucose-Capillary: 240 mg/dL — ABNORMAL HIGH (ref 70–99)

## 2019-01-23 LAB — PREPARE RBC (CROSSMATCH)

## 2019-01-23 MED ORDER — OXYCODONE-ACETAMINOPHEN 5-325 MG PO TABS
1.0000 | ORAL_TABLET | ORAL | Status: DC | PRN
  Administered 2019-01-23: 1 via ORAL
  Administered 2019-01-23 – 2019-01-24 (×2): 2 via ORAL
  Filled 2019-01-23 (×2): qty 2
  Filled 2019-01-23: qty 1

## 2019-01-23 MED ORDER — TRAMADOL HCL 50 MG PO TABS
100.0000 mg | ORAL_TABLET | Freq: Four times a day (QID) | ORAL | Status: DC
  Administered 2019-01-23 – 2019-01-24 (×4): 100 mg via ORAL
  Filled 2019-01-23 (×4): qty 2

## 2019-01-23 NOTE — Progress Notes (Signed)
Patient ID: Kevin Mckay, male   DOB: 04-Aug-1950, 68 y.o.   MRN: [id number]  Overall he had a pretty quiet night.  He is hungry this morning and would like a regular diet.  There is no air leak from his chest tubes.  His chest x-ray from this morning was independently reviewed and shows no pneumothorax or pleural effusion.  His lungs are clear and his wounds are clean dry and intact.  I did review with him the results of the frozen section in the operating room.  I explained to him that the initial result was that this represented a lymphoma and that further details would need additional studies.  I would like to place his chest tube to waterseal now.  We will repeat his chest x-ray this afternoon.  If his chest x-ray looks good we can remove his chest tube.  We will advance his diet and encourage ambulation this morning.  I will readjust his pain medications.  Tim [employer]

## 2019-01-23 NOTE — TOC Initial Note (Signed)
Transition of Care [facility]) - Initial/Assessment Note    Patient Details  Name: Kevin Mckay MRN: [id number] Date of Birth: 12/26/1950  [facility] of [facility]) CM/SW Contact:    Katrina Stack, RN Phone Number: 01/23/2019, 4:13 PM  Clinical Narrative:                Low risk score and no consult ordered. Assessed due to nature of illness- mediastinal mass.  Elective admission for bronch and left thoracotomy. Chest tube in place. Room air.  No needs identified at present         Patient Goals and CMS Choice        Expected Discharge Plan and Services         Living arrangements for the past 2 months: Single Family Home                                      Prior Living Arrangements/Services Living arrangements for the past 2 months: Single Family Home Lives with:: Spouse                   Activities of Daily Living Home Assistive Devices/Equipment: None ADL Screening (condition at time of admission) Patient's cognitive ability adequate to safely complete daily activities?: Yes Is the patient deaf or have difficulty hearing?: No Does the patient have difficulty seeing, even when wearing glasses/contacts?: No Does the patient have difficulty concentrating, remembering, or making decisions?: No Patient able to express need for assistance with ADLs?: Yes Does the patient have difficulty dressing or bathing?: No Independently performs ADLs?: Yes (appropriate for developmental age) Does the patient have difficulty walking or climbing stairs?: No Weakness of Legs: None Weakness of Arms/Hands: None  Permission Sought/Granted                  Emotional Assessment              Admission diagnosis:  MEDIASTINAL MASS Patient Active Problem List   Diagnosis Date Noted  . Mediastinal mass 01/09/2019  . Neuropathy 08/28/2018  . Tremor 08/28/2018  . Abnormal brain MRI 08/11/2015  . Essential hypertension 04/01/2015  . Type 2 diabetes mellitus  with diabetic neuropathy ([place]) 04/01/2015  . Subdural hematoma ([place]) 04/01/2015  . Tobacco use 04/01/2015   PCP:  Laneta Simmers, NP Pharmacy:   [facility], [address] [place] [address] Phone: [phone] Fax: [phone]     Social Determinants of Health (SDOH) Interventions    Readmission Risk Interventions No flowsheet data found.

## 2019-01-24 LAB — GLUCOSE, CAPILLARY
Glucose-Capillary: 173 mg/dL — ABNORMAL HIGH (ref 70–99)
Glucose-Capillary: 107 mg/dL — ABNORMAL HIGH (ref 70–99)
Glucose-Capillary: 106 mg/dL — ABNORMAL HIGH (ref 70–99)

## 2019-01-24 MED ORDER — TRAMADOL HCL 50 MG PO TABS: 100.0000 mg | ORAL_TABLET | Freq: Four times a day (QID) | ORAL | 0 refills | Status: DC

## 2019-01-24 MED ORDER — OXYCODONE-ACETAMINOPHEN 5-325 MG PO TABS: 1.0000 | ORAL_TABLET | ORAL | 0 refills | Status: DC | PRN

## 2019-01-24 NOTE — Progress Notes (Signed)
Before going for the second walk, patient's oxygen saturation was 89% on room air in the sitting position.  After ambulation, patient's oxygen saturation decreased to 83% on room air so I put him back on 2L O2 nasal cannula. Sating at 94% on 2L at rest.  Will continue to monitor.  Christene Slates  01/24/2019 12:50 AM

## 2019-01-24 NOTE — Discharge Summary (Signed)
Physician Discharge Summary  Patient ID: Kevin Mckay MRN: [id number] DOB/AGE: February 03, 1951 68 y.o.  Admit date: 01/22/2019 Discharge date: 01/24/2019   Discharge Diagnoses:  Active Problems:   Mediastinal mass   Procedures:Left anterior thoracotomy and biopsy of mediastinal mass  Hospital Course: Admitted to the hospital after biopsy of mediastinal mass.  Minimal chest tube drainage and tube removed POD #1.  Did well afterwards and discharged on POD #2.  No problems postop.  Disposition: Discharge disposition: 01-Home or Self Care       Discharge Instructions    Diet - low sodium heart healthy   Complete by: As directed    Increase activity slowly   Complete by: As directed      Allergies as of 01/24/2019      Reactions   Amoxicillin Hives   Codeine Nausea Only      Medication List    TAKE these medications   amLODipine 10 MG tablet Commonly known as: NORVASC Take 10 mg by mouth daily.   DULoxetine 30 MG capsule Commonly known as: CYMBALTA Take 30 mg by mouth daily.   gabapentin 600 MG tablet Commonly known as: NEURONTIN Take 600 mg by mouth every 6 (six) hours as needed (pain. Not to exceed 3600 mg).   glipiZIDE 10 MG 24 hr tablet Commonly known as: GLUCOTROL XL Take 10 mg by mouth daily with breakfast.   hydrochlorothiazide 25 MG tablet Commonly known as: HYDRODIURIL Take 1 tablet by mouth daily.   lovastatin 20 MG tablet Commonly known as: MEVACOR Take 20 mg by mouth every evening.   metFORMIN 1000 MG tablet Commonly known as: GLUCOPHAGE Take 1,000 mg by mouth 2 (two) times daily with a meal.   oxyCODONE-acetaminophen 5-325 MG tablet Commonly known as: PERCOCET/ROXICET Take 1-2 tablets by mouth every 4 (four) hours as needed for moderate pain.   prazosin 1 MG capsule Commonly known as: MINIPRESS Take 1 mg by mouth at bedtime.   sitaGLIPtin 25 MG tablet Commonly known as: JANUVIA Take 25 mg by mouth daily.   SYMBICORT IN Inhale into the  lungs daily.   traMADol 50 MG tablet Commonly known as: ULTRAM Take 2 tablets (100 mg total) by mouth every 6 (six) hours.        [name], MD

## 2019-01-24 NOTE — Progress Notes (Signed)
Patient ID: Kevin Mckay, male   DOB: 1950-07-13, 68 y.o.   MRN: [id number]  Quiet night.  Eating well.  BS under good cotrol.  Wounds clean and dry Lungs clear Heart regular  Chest tube removed yesterday  Will discharge today To see me in one week  [employer].

## 2019-01-26 NOTE — Addendum Note (Signed)
Addendum  created 01/26/19 1104 by Doreen Salvage, CRNA   Charge Capture section accepted

## 2019-01-27 ENCOUNTER — Encounter: Payer: Self-pay | Admitting: Internal Medicine

## 2019-01-28 ENCOUNTER — Other Ambulatory Visit: Payer: Self-pay | Admitting: Anatomic Pathology & Clinical Pathology

## 2019-01-28 LAB — SURGICAL PATHOLOGY

## 2019-01-29 ENCOUNTER — Inpatient Hospital Stay: Payer: BC Managed Care – PPO | Attending: Internal Medicine | Admitting: Internal Medicine

## 2019-01-29 ENCOUNTER — Inpatient Hospital Stay: Payer: BC Managed Care – PPO

## 2019-01-29 ENCOUNTER — Encounter: Payer: Self-pay | Admitting: *Deleted

## 2019-01-29 ENCOUNTER — Other Ambulatory Visit: Payer: Self-pay

## 2019-01-29 ENCOUNTER — Telehealth: Payer: Self-pay | Admitting: *Deleted

## 2019-01-29 DIAGNOSIS — E611 Iron deficiency: Secondary | ICD-10-CM

## 2019-01-29 DIAGNOSIS — Z5111 Encounter for antineoplastic chemotherapy: Secondary | ICD-10-CM | POA: Diagnosis present

## 2019-01-29 DIAGNOSIS — C781 Secondary malignant neoplasm of mediastinum: Secondary | ICD-10-CM

## 2019-01-29 DIAGNOSIS — C771 Secondary and unspecified malignant neoplasm of intrathoracic lymph nodes: Secondary | ICD-10-CM | POA: Insufficient documentation

## 2019-01-29 DIAGNOSIS — C3490 Malignant neoplasm of unspecified part of unspecified bronchus or lung: Secondary | ICD-10-CM | POA: Insufficient documentation

## 2019-01-29 DIAGNOSIS — F329 Major depressive disorder, single episode, unspecified: Secondary | ICD-10-CM | POA: Diagnosis not present

## 2019-01-29 DIAGNOSIS — C801 Malignant (primary) neoplasm, unspecified: Secondary | ICD-10-CM | POA: Diagnosis not present

## 2019-01-29 DIAGNOSIS — F17210 Nicotine dependence, cigarettes, uncomplicated: Secondary | ICD-10-CM | POA: Insufficient documentation

## 2019-01-29 DIAGNOSIS — J440 Chronic obstructive pulmonary disease with acute lower respiratory infection: Secondary | ICD-10-CM | POA: Insufficient documentation

## 2019-01-29 DIAGNOSIS — Z79899 Other long term (current) drug therapy: Secondary | ICD-10-CM | POA: Diagnosis not present

## 2019-01-29 DIAGNOSIS — D649 Anemia, unspecified: Secondary | ICD-10-CM | POA: Diagnosis not present

## 2019-01-29 DIAGNOSIS — I10 Essential (primary) hypertension: Secondary | ICD-10-CM | POA: Diagnosis not present

## 2019-01-29 DIAGNOSIS — Z7984 Long term (current) use of oral hypoglycemic drugs: Secondary | ICD-10-CM | POA: Insufficient documentation

## 2019-01-29 DIAGNOSIS — J189 Pneumonia, unspecified organism: Secondary | ICD-10-CM | POA: Insufficient documentation

## 2019-01-29 DIAGNOSIS — E1151 Type 2 diabetes mellitus with diabetic peripheral angiopathy without gangrene: Secondary | ICD-10-CM | POA: Diagnosis not present

## 2019-01-29 DIAGNOSIS — J9859 Other diseases of mediastinum, not elsewhere classified: Secondary | ICD-10-CM

## 2019-01-29 DIAGNOSIS — E1142 Type 2 diabetes mellitus with diabetic polyneuropathy: Secondary | ICD-10-CM | POA: Insufficient documentation

## 2019-01-29 LAB — BASIC METABOLIC PANEL
Anion gap: 10 (ref 5–15)
BUN: 21 mg/dL (ref 8–23)
CO2: 25 mmol/L (ref 22–32)
Calcium: 9.1 mg/dL (ref 8.9–10.3)
Chloride: 101 mmol/L (ref 98–111)
Creatinine, Ser: 1.11 mg/dL (ref 0.61–1.24)
GFR calc Af Amer: >60 mL/min (ref >60)
GFR calc non Af Amer: >60 mL/min (ref >60)
Glucose, Bld: 186 mg/dL — ABNORMAL HIGH (ref 70–99)
Potassium: 4.6 mmol/L (ref 3.5–5.1)
Sodium: 136 mmol/L (ref 135–145)

## 2019-01-29 LAB — IRON AND TIBC
Iron: 21 ug/dL — ABNORMAL LOW (ref 45–182)
Saturation Ratios: 5 % — ABNORMAL LOW (ref 17.9–39.5)
TIBC: 388 ug/dL (ref 250–450)
UIBC: 367 ug/dL

## 2019-01-29 LAB — CBC WITH DIFFERENTIAL/PLATELET
Abs Immature Granulocytes: 0.03 10*3/uL (ref 0.00–0.07)
Basophils Absolute: 0.1 10*3/uL (ref 0.0–0.1)
Basophils Relative: 1 %
Eosinophils Absolute: 0.7 10*3/uL — ABNORMAL HIGH (ref 0.0–0.5)
Eosinophils Relative: 9 %
HCT: 29.6 % — ABNORMAL LOW (ref 39.0–52.0)
Hemoglobin: 9 g/dL — ABNORMAL LOW (ref 13.0–17.0)
Immature Granulocytes: 0 %
Lymphocytes Relative: 19 %
Lymphs Abs: 1.7 10*3/uL (ref 0.7–4.0)
MCH: 23.3 pg — ABNORMAL LOW (ref 26.0–34.0)
MCHC: 30.4 g/dL (ref 30.0–36.0)
MCV: 76.7 fL — ABNORMAL LOW (ref 80.0–100.0)
Monocytes Absolute: 0.8 10*3/uL (ref 0.1–1.0)
Monocytes Relative: 9 %
Neutro Abs: 5.5 10*3/uL (ref 1.7–7.7)
Neutrophils Relative %: 62 %
Platelets: 239 10*3/uL (ref 150–400)
RBC: 3.86 MIL/uL — ABNORMAL LOW (ref 4.22–5.81)
RDW: 17.7 % — ABNORMAL HIGH (ref 11.5–15.5)
WBC: 8.8 10*3/uL (ref 4.0–10.5)
nRBC: 0 % (ref 0.0–0.2)

## 2019-01-29 LAB — FERRITIN: Ferritin: 22 ng/mL — ABNORMAL LOW (ref 24–336)

## 2019-01-29 MED ORDER — ONDANSETRON HCL 8 MG PO TABS: 8.0000 mg | ORAL_TABLET | Freq: Three times a day (TID) | ORAL | 1 refills | Status: DC | PRN

## 2019-01-29 MED ORDER — LIDOCAINE-PRILOCAINE 2.5-2.5 % EX CREA: 1.0000 "application " | TOPICAL_CREAM | CUTANEOUS | 0 refills | Status: DC | PRN

## 2019-01-29 MED ORDER — PROCHLORPERAZINE MALEATE 10 MG PO TABS: 10.0000 mg | ORAL_TABLET | Freq: Four times a day (QID) | ORAL | 1 refills | Status: DC | PRN

## 2019-01-29 NOTE — Progress Notes (Signed)
Patient does not offer any problems today.  

## 2019-01-29 NOTE — Telephone Encounter (Signed)
fmla forms received today and completed by Nira Conn, RN. Pending md signature.

## 2019-01-29 NOTE — Assessment & Plan Note (Addendum)
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  I reviewed the pathology and staging with the patient and family in detail.   # #Discussed chemo-radiation therapy for limited stage small cell lung cancer includes carbo etoposide-every 3 weeks with concurrent radiation.  Discussed the goal of treatment is cure; although unfortunately quite small in the range of 10%.  Will check into the option of clinical trials if available at [facility]-incorporation of immunotherapy.  Discussed the potential side effects including but not limited to-increasing fatigue, nausea vomiting, diarrhea, hair loss, sores in the mouth, increase risk of infection and also neuropathy.   # Chemotherapy education; port placement. Antiemetics-Zofran and Compazine; EMLA cream sent to pharmacy.  Also recommend MRI of the brain given the risk of CNS involvement.  # Anemia-hemoglobin 9/iron studies-ferritin 22 saturation 5% suggestive of iron deficiency.  Etiology is unclear.  Patient need further work-up including EGD colonoscopy.  No obvious lesions noted on PET scan.  We will plan IV iron infusions with chemotherapies.  Will discuss at tumor conference.  # 40 minutes face-to-face with the patient discussing the above plan of care; more than 50% of time spent on prognosis/ natural history; counseling and coordination.  # DISPOSITION: # chemo ed/ port placement # MRI Brain asap # referral to Dr.Crystal for RT ASAP # follow up with on Aug 17th MD/labs- cbc/cmp/LDH- carbo-etop [[id number]] Dr.B

## 2019-01-29 NOTE — Progress Notes (Signed)
  Oncology Nurse Navigator Documentation  Navigator Location: [facility] (01/29/19 1100)   )Navigator Encounter Type: Follow-up Appt (01/29/19 1100)     Confirmed Diagnosis Date: 01/28/19 (01/29/19 1100)               Patient Visit Type: MedOnc (01/29/19 1100) Treatment Phase: Pre-Tx/Tx Discussion (01/29/19 1100) Barriers/Navigation Needs: Coordination of Care;Education (01/29/19 1100) Education: Newly Diagnosed Cancer Education;Understanding Cancer/ Treatment Options (01/29/19 1100) Interventions: Coordination of Care;Education (01/29/19 1100)   Coordination of Care: Appts;Chemo;Radiology (01/29/19 1100) Education Method: Written;Verbal (01/29/19 1100)     met with patient during follow up visit with Dr. Rogue Bussing to discuss pathology results and treatment options. All questions answered during visit. Pt given resources regarding diagnosis and supportive services available. Reviewed upcoming appts. Contact info given and instructed to call with any further questions or needs. Pt verbalized understanding. Nothing further needed at this time.            Time Spent with Patient: 60 (01/29/19 1100)

## 2019-01-29 NOTE — Progress Notes (Signed)
START ON PATHWAY REGIMEN - Small Cell Lung     A cycle is every 21 days:     Etoposide      Carboplatin   **Always confirm dose/schedule in your pharmacy ordering system**  Patient Characteristics: Newly Diagnosed, Preoperative or Nonsurgical Candidate (Clinical Staging), First Line, Limited Stage, Nonsurgical Candidate Therapeutic Status: Newly Diagnosed, Preoperative or Nonsurgical Candidate (Clinical Staging) AJCC T Category: cTX AJCC N Category: cN2 AJCC M Category: cM0 AJCC 8 Stage Grouping: Unknown Stage Classification: Limited Surgical Candidacy: Nonsurgical Candidate Intent of Therapy: Curative Intent, Discussed with Patient

## 2019-01-30 ENCOUNTER — Other Ambulatory Visit
Admission: RE | Admit: 2019-01-30 | Discharge: 2019-01-30 | Disposition: A | Discharge status: Home / Self Care | Payer: BC Managed Care – PPO | Source: Ambulatory Visit | Attending: Cardiothoracic Surgery | Admitting: Cardiothoracic Surgery

## 2019-01-30 ENCOUNTER — Ambulatory Visit (INDEPENDENT_AMBULATORY_CARE_PROVIDER_SITE_OTHER): Payer: BC Managed Care – PPO | Admitting: Cardiothoracic Surgery

## 2019-01-30 ENCOUNTER — Telehealth: Payer: Self-pay | Admitting: *Deleted

## 2019-01-30 ENCOUNTER — Encounter: Payer: Self-pay | Admitting: Cardiothoracic Surgery

## 2019-01-30 ENCOUNTER — Ambulatory Visit (INDEPENDENT_AMBULATORY_CARE_PROVIDER_SITE_OTHER): Payer: Self-pay | Admitting: *Deleted

## 2019-01-30 VITALS — BP 165/74 | HR 72 | Temp 97.7°F | Ht 69.0 in | Wt 145.0 lb

## 2019-01-30 DIAGNOSIS — Z01812 Encounter for preprocedural laboratory examination: Secondary | ICD-10-CM | POA: Diagnosis present

## 2019-01-30 DIAGNOSIS — Z20828 Contact with and (suspected) exposure to other viral communicable diseases: Secondary | ICD-10-CM | POA: Insufficient documentation

## 2019-01-30 DIAGNOSIS — M7989 Other specified soft tissue disorders: Secondary | ICD-10-CM

## 2019-01-30 DIAGNOSIS — R918 Other nonspecific abnormal finding of lung field: Secondary | ICD-10-CM

## 2019-01-30 NOTE — H&P (View-Only) (Signed)
He returns today in follow-up.  He underwent a left anterior thoracotomy with biopsy of a mediastinal mass.  The frozen section was consistent with lymphoma but permanent sections were consistent with small cell carcinoma.  He did see Dr. Lynett Fish in follow-up.  He has discussed with him the indications and risks of chemotherapy and radiation therapy for management of his non-small cell lung cancer.  He has elected to pursue that and comes in today to discuss Port-A-Cath options.  His thoracotomy wound is well-healed.  There is still some Steri-Strips present.  There is no erythema or drainage.  His lungs are clear and his heart is regular.  I had a long discussion with him today regarding the indications and risks of Port-A-Cath placement.  Risks of bleeding, infection, pneumothorax and death were all reviewed.  He and his wife had all their questions answered.  They would like [place] to proceed.  We will go ahead and get that on the schedule soon as possible.

## 2019-01-30 NOTE — Progress Notes (Signed)
He returns today in follow-up.  He underwent a left anterior thoracotomy with biopsy of a mediastinal mass.  The frozen section was consistent with lymphoma but permanent sections were consistent with small cell carcinoma.  He did see Dr. Lynett Fish in follow-up.  He has discussed with him the indications and risks of chemotherapy and radiation therapy for management of his non-small cell lung cancer.  He has elected to pursue that and comes in today to discuss Port-A-Cath options.  His thoracotomy wound is well-healed.  There is still some Steri-Strips present.  There is no erythema or drainage.  His lungs are clear and his heart is regular.  I had a long discussion with him today regarding the indications and risks of Port-A-Cath placement.  Risks of bleeding, infection, pneumothorax and death were all reviewed.  He and his wife had all their questions answered.  They would like [place] to proceed.  We will go ahead and get that on the schedule soon as possible.

## 2019-01-30 NOTE — Patient Instructions (Signed)
Implanted Port Insertion Implanted port insertion is a procedure to put in a port and catheter. The port is a device with an injectable disk that can be accessed by your health care provider. The port is connected to a vein in the chest or neck by a small flexible tube (catheter). There are different types of ports. The implanted port may be used as a long-term IV access for:  Medicines, such as chemotherapy.  Fluids.  Liquid nutrition, such as total parenteral nutrition (TPN). When you have a port, this means that your health care provider will not need to use the veins in your arms for these procedures. Tell a health care provider about:  Any allergies you have.  All medicines you are taking, especially blood thinners, as well as any vitamins, herbs, eye drops, creams, over-the-counter medicines, and steroids.  Any problems you or family members have had with anesthetic medicines.  Any blood disorders you have.  Any surgeries you have had.  Any medical conditions you have or have had, including diabetes or kidney problems.  Whether you are pregnant or may be pregnant. What are the risks? Generally, this is a safe procedure. However, problems may occur, including:  Allergic reactions to medicines or dyes.  Damage to other structures or organs.  Infection.  Damage to the blood vessel, bruising, or bleeding at the puncture site.  Blood clot.  Breakdown of the skin over the port.  A collection of air in the chest that can cause one of the lungs to collapse (pneumothorax). This is rare. What happens before the procedure? Medicines  Ask your health care provider about: ? Changing or stopping your regular medicines. This is especially important if you are taking diabetes medicines or blood thinners. ? Taking medicines such as aspirin and ibuprofen. These medicines can thin your blood. Do not take these medicines unless your health care provider tells you to take them. ?  Taking over-the-counter medicines, vitamins, herbs, and supplements. Staying hydrated Follow instructions from your health care provider about hydration, which may include:  Up to 2 hours before the procedure - you may continue to drink clear liquids, such as water, clear fruit juice, black coffee, and plain tea.  Eating and drinking restrictions  Follow instructions from your health care provider about eating and drinking, which may include: ? 8 hours before the procedure - stop eating heavy meals or foods, such as meat, fried foods, or fatty foods. ? 6 hours before the procedure - stop eating light meals or foods, such as toast or cereal. ? 6 hours before the procedure - stop drinking milk or drinks that contain milk. ? 2 hours before the procedure - stop drinking clear liquids. General instructions  Plan to have someone take you home from the hospital or clinic.  If you will be going home right after the procedure, plan to have someone with you for 24 hours.  You may have blood tests.  Do not use any products that contain nicotine or tobacco for at least 4-6 weeks before the procedure. These products include cigarettes, e-cigarettes, and chewing tobacco. If you need help quitting, ask your health care provider.  Ask your health care provider what steps will be taken to help prevent infection. These may include: ? Removing hair at the surgery site. ? Washing skin with a germ-killing soap. ? Taking antibiotic medicine. What happens during the procedure?   An IV will be inserted into one of your veins.  You will be given  one or more of the following: ? A medicine to help you relax (sedative). ? A medicine to numb the area (local anesthetic).  Two small incisions will be made to insert the port. ? One smaller incision will be made in your neck to get access to the vein where the catheter will lie. ? The other incision will be made in the upper chest. This is where the port will  lie.  The procedure may be done using continuous X-ray (fluoroscopy) or other imaging tools for guidance.  The port and catheter will be placed. There may be a small, raised area where the port is.  The port will be flushed with a salt solution (saline), and blood will be drawn to make sure that it is working correctly.  The incisions will be closed.  Bandages (dressings) may be placed over the incisions. The procedure may vary among health care providers and hospitals. What happens after the procedure?  Your blood pressure, heart rate, breathing rate, and blood oxygen level will be monitored until you leave the hospital or clinic.  Do not drive for 24 hours if you were given a sedative during your procedure.  You will be given a manufacturer's information card for the type of port that you have. Keep this with you.  Your port will need to be flushed and checked as told by your health care provider, usually every few weeks.  A chest X-ray will be done to: ? Check the placement of the port. ? Make sure there is no injury to your lung. Summary  Implanted port insertion is a procedure to put in a port and catheter.  The implanted port is used as a long-term IV access.  The port will need to be flushed and checked as told by your health care provider, usually every few weeks.  Keep your manufacturer's information card with you at all times. This information is not intended to replace advice given to you by your health care provider. Make sure you discuss any questions you have with your health care provider. Document Released: 04/01/2013 Document Revised: 10/03/2018 Document Reviewed: 01/07/2018 Elsevier Patient Education  [place].

## 2019-01-30 NOTE — Telephone Encounter (Signed)
Patient coming in to see Dr, Genevive Bi

## 2019-01-30 NOTE — Progress Notes (Addendum)
Patient's surgery to be scheduled for 02/04/19 at [facility] with Dr. Genevive Bi.   The patient is aware to have COVID-19 testing done on 01/30/19 at the [place] building drive thru ([address] [place]) between 8:00 am and 10:30 am. He is aware to isolate after, have no visitors, wash hands frequently, and avoid touching face.   The patient is aware he will need to Pre-Admit on 02/02/19 at 3:00 pm. Patient will check in at the [place] entrance due to COVID-19 restrictions and will then be escorted to the [place], [address] (first floor). Patient aware to be NPO after midnight and have a driver.   He is aware to check in at the [place] entrance where he will be screened for the coronavirus and then sent to [facility] Day Surgery.   Patient aware that he may have one visitor due to COVID-19 restrictions.   The patient verbalizes understanding of the above.   The patient is aware to call the office should he have further questions.

## 2019-01-30 NOTE — Progress Notes (Signed)
The sutures were removed and steri strips applied.

## 2019-01-31 LAB — SARS CORONAVIRUS 2 (TAT 6-24 HRS): SARS Coronavirus 2: NEGATIVE

## 2019-02-02 ENCOUNTER — Other Ambulatory Visit: Payer: Self-pay

## 2019-02-02 ENCOUNTER — Other Ambulatory Visit: Payer: BC Managed Care – PPO

## 2019-02-02 ENCOUNTER — Encounter: Payer: Self-pay | Admitting: Radiation Oncology

## 2019-02-02 ENCOUNTER — Ambulatory Visit
Admission: RE | Admit: 2019-02-02 | Discharge: 2019-02-02 | Disposition: A | Discharge status: Home / Self Care | Payer: Medicare Other | Source: Ambulatory Visit | Attending: Radiation Oncology | Admitting: Radiation Oncology

## 2019-02-02 ENCOUNTER — Encounter
Admission: RE | Admit: 2019-02-02 | Discharge: 2019-02-02 | Disposition: A | Discharge status: Home / Self Care | Payer: BC Managed Care – PPO | Source: Ambulatory Visit | Attending: Cardiothoracic Surgery | Admitting: Cardiothoracic Surgery

## 2019-02-02 VITALS — BP 141/62 | HR 66 | Temp 97.6°F | Resp 18 | Wt 142.1 lb

## 2019-02-02 DIAGNOSIS — F17210 Nicotine dependence, cigarettes, uncomplicated: Secondary | ICD-10-CM | POA: Diagnosis not present

## 2019-02-02 DIAGNOSIS — I10 Essential (primary) hypertension: Secondary | ICD-10-CM | POA: Diagnosis not present

## 2019-02-02 DIAGNOSIS — F329 Major depressive disorder, single episode, unspecified: Secondary | ICD-10-CM | POA: Diagnosis not present

## 2019-02-02 DIAGNOSIS — C801 Malignant (primary) neoplasm, unspecified: Secondary | ICD-10-CM | POA: Diagnosis not present

## 2019-02-02 DIAGNOSIS — Z87442 Personal history of urinary calculi: Secondary | ICD-10-CM | POA: Insufficient documentation

## 2019-02-02 DIAGNOSIS — J45909 Unspecified asthma, uncomplicated: Secondary | ICD-10-CM | POA: Diagnosis not present

## 2019-02-02 DIAGNOSIS — Z01818 Encounter for other preprocedural examination: Secondary | ICD-10-CM | POA: Diagnosis not present

## 2019-02-02 DIAGNOSIS — E1140 Type 2 diabetes mellitus with diabetic neuropathy, unspecified: Secondary | ICD-10-CM | POA: Diagnosis not present

## 2019-02-02 DIAGNOSIS — C781 Secondary malignant neoplasm of mediastinum: Secondary | ICD-10-CM | POA: Diagnosis present

## 2019-02-02 NOTE — Consult Note (Signed)
NEW PATIENT EVALUATION  Name: Kevin Mckay  MRN: [id number]  Date:   02/02/2019     DOB: 1951/01/07   This 68 y.o. 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  REFERRING PHYSICIAN: Laneta Simmers, NP  CHIEF COMPLAINT:  Chief Complaint  Patient presents with  . 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   PREVIOUS INVESTIGATIONS:  CT scans and PET CT scans reviewed Pathology report reviewed Clinical notes reviewed  HPI: Patient is a 68 year old male presented with some decreased appetite to his PMD.  At that time chest x-ray showed a mass abutting the AP window measuring approximately 3 x 2.5 cm.  PET CT scan confirmed hypermetabolic activity in this mass concerning for malignancy.  He underwent open biopsy by Dr. Faith Rogue which was positive for small cell lung cancer.  PET CT scan showed no evidence of disease outside the chest.  Brain MRI scan is pending.  He specifically denies cough hemoptysis or chest tightness.  He is having no bone pain.  Patient is scheduled this week for an MRI scan of his brain to complete his staging work-up as well as a port placement.  He is otherwise doing well.  PLANNED TREATMENT REGIMEN: Concurrent chemoradiation  PAST MEDICAL HISTORY:  has a past medical history of Asthma, Cerebral aneurysm, Chronic painful diabetic neuropathy ([place]), Depression, Diabetes mellitus without complication ([place]), Essential hypertension, History of kidney stones, Occasional tremors, and Tobacco use.    PAST SURGICAL HISTORY:  Past Surgical History:  Procedure Laterality Date  . ABDOMINAL SURGERY     age 68. trauma surgery due to forklift injury- liver and spleen  . IR GENERIC HISTORICAL  05/31/2016   IR ANGIO VERTEBRAL SEL VERTEBRAL BILAT MOD SED 05/31/2016 Consuella Lose, MD  [facility]  . IR GENERIC HISTORICAL  05/31/2016   IR ANGIO INTRA EXTRACRAN SEL INTERNAL CAROTID BILAT MOD SED 05/31/2016 Consuella Lose, MD [facility]  . THORACOTOMY Left 01/22/2019   Procedure: PRE OP BRONCH LEFT ANTERIOR THORACOTOMY WITH BIOPSY OF MEDIASTINAL MASS;  Surgeon: [name], MD;  Location: [facility] ORS;  Service: General;  Laterality: Left;    FAMILY HISTORY: family history includes Liver cancer in his paternal uncle; Lung cancer in his maternal uncle and paternal uncle; Lymphoma in his father.  SOCIAL HISTORY:  reports that he has been smoking cigarettes. He has a 25.00 pack-year smoking history. He has never used smokeless tobacco. He reports current drug use. Drug: Barbituates. He reports that he does not drink alcohol.  ALLERGIES: Amoxicillin and Codeine  MEDICATIONS:  Current Outpatient Medications  Medication Sig Dispense Refill  . amLODipine (NORVASC) 10 MG tablet Take 10 mg by mouth daily.    . Budesonide-Formoterol Fumarate (SYMBICORT IN) Inhale into the lungs daily.     . DULoxetine (CYMBALTA) 30 MG capsule Take 30 mg by mouth daily.    Marland Kitchen gabapentin (NEURONTIN) 600 MG tablet Take 600 mg by mouth every 6 (six) hours as needed (pain. Not to exceed 3600 mg).     Marland Kitchen glipiZIDE (GLUCOTROL XL) 10 MG 24 hr tablet Take 10 mg by mouth daily with breakfast.    . hydrochlorothiazide (HYDRODIURIL) 25 MG tablet Take 1 tablet by mouth daily.    Marland Kitchen lidocaine-prilocaine (EMLA) cream Apply 1 application topically as needed. 30-45 mins prior to port access. [place]  g 0  . lovastatin (MEVACOR) 20 MG tablet Take 20 mg by mouth every evening.    . metFORMIN (GLUCOPHAGE) 1000 MG tablet Take 1,000 mg by mouth 2 (two) times daily with a meal.    . ondansetron (ZOFRAN) 8 MG tablet Take 1 tablet (8 mg total) by mouth every 8 (eight) hours as needed for nausea or vomiting (start 3 days; after chemo). 40 tablet 1  . oxyCODONE-acetaminophen (PERCOCET/ROXICET) 5-325 MG tablet Take 1-2 tablets by  mouth every 4 (four) hours as needed for moderate pain. 30 tablet 0  . prazosin (MINIPRESS) 1 MG capsule Take 1 mg by mouth at bedtime.    . prochlorperazine (COMPAZINE) 10 MG tablet Take 1 tablet (10 mg total) by mouth every 6 (six) hours as needed for nausea or vomiting. 40 tablet 1  . sitaGLIPtin (JANUVIA) 25 MG tablet Take 25 mg by mouth daily.    . traMADol (ULTRAM) 50 MG tablet Take 2 tablets (100 mg total) by mouth every 6 (six) hours. 30 tablet 0   No current facility-administered medications for this encounter.     ECOG PERFORMANCE STATUS:  0 - Asymptomatic  REVIEW OF SYSTEMS: Patient denies any weight loss, fatigue, weakness, fever, chills or night sweats. Patient denies any loss of vision, blurred vision. Patient denies any ringing  of the ears or hearing loss. No irregular heartbeat. Patient denies heart murmur or history of fainting. Patient denies any chest pain or pain radiating to her upper extremities. Patient denies any shortness of breath, difficulty breathing at night, cough or hemoptysis. Patient denies any swelling in the lower legs. Patient denies any nausea vomiting, vomiting of blood, or coffee ground material in the vomitus. Patient denies any stomach pain. Patient states has had normal bowel movements no significant constipation or diarrhea. Patient denies any dysuria, hematuria or significant nocturia. Patient denies any problems walking, swelling in the joints or loss of balance. Patient denies any skin changes, loss of hair or loss of weight. Patient denies any excessive worrying or anxiety or significant depression. Patient denies any problems with insomnia. Patient denies excessive thirst, polyuria, polydipsia. Patient denies any swollen glands, patient denies easy bruising or easy bleeding. Patient denies any recent infections, allergies or URI. Patient "s visual fields have not changed significantly in recent time.   PHYSICAL EXAM: BP (!) 141/62   Pulse 66   Temp  97.6 F (36.4 C)   Resp 18   Wt 142 lb 1.6 oz (64.5 kg)   BMI 20.98 kg/m  Well-developed well-nourished patient in NAD. HEENT reveals PERLA, EOMI, discs not visualized.  Oral cavity is clear. No oral mucosal lesions are identified. Neck is clear without evidence of cervical or supraclavicular adenopathy. Lungs are clear to A&P. Cardiac examination is essentially unremarkable with regular rate and rhythm without murmur rub or thrill. Abdomen is benign with no organomegaly or masses noted. Motor sensory and DTR levels are equal and symmetric in the upper and lower extremities. Cranial nerves II through XII are grossly intact. Proprioception is intact. No peripheral adenopathy or edema is identified. No motor or sensory levels are noted. Crude visual fields are within normal range.  LABORATORY DATA: Pathology report reviewed    RADIOLOGY RESULTS: CT scans and PET CT scans reviewed MRI will be reviewed after procedure on Wednesday   IMPRESSION: Limited stage small cell lung cancer in 68 year old male  PLAN: At this time I recommend concurrent chemoradiation.  We will review his MRI scan of his brain to  complete his staging work-up and that is available later this week.  I would plan on delivering 6000 cGy in 30 fractions to his chest using 3-dimensional treatment planning.  We will use PET CT scanning my treatment planning.  I have personally set up and ordered CT simulation for later this week.  Risks and benefits of treatment including development of cough possible dysphasia dysphasia from radiation esophagitis skin reaction alteration of blood to counts and fatigue all were discussed in detail with the patient.  There will be extra effort by both professional staff as well as technical staff to coordinate and manage concurrent chemoradiation and ensuing side effects during his treatments. After completion of all treatment will reevaluate imaging and if patient is in complete response would offer PCI.   Patient comprehends my treatment plan well.  I would like to take this opportunity to thank you for allowing me to participate in the care of your patient.Noreene Filbert, MD

## 2019-02-02 NOTE — Patient Instructions (Signed)
Your procedure is scheduled on: Wed 02/04/19 Report to [facility]. To find out your arrival time please call [phone] between 1PM - 3PM on Tue 02/03/19.  Remember: Instructions that are not followed completely may result in serious medical risk, up to and including death, or upon the discretion of your surgeon and anesthesiologist your surgery may need to be rescheduled.     _X__ 1. Do not eat food after midnight the night before your procedure.                 No gum chewing or hard candies. You may drink clear liquids up to 2 hours                 before you are scheduled to arrive for your surgery- DO not drink clear                 liquids within 2 hours of the start of your surgery.                 Clear Liquids include:  water, apple juice without pulp, clear carbohydrate                 drink such as Clearfast or Gatorade, Black Coffee or Tea (Do not add                 anything to coffee or tea). Diabetics water only  __X__2.  On the morning of surgery brush your teeth with toothpaste and water, you                 may rinse your mouth with mouthwash if you wish.  Do not swallow any              toothpaste of mouthwash.     _X__ 3.  No Alcohol for 24 hours before or after surgery.   _X__ 4.  Do Not Smoke or use e-cigarettes For 24 Hours Prior to Your Surgery.                 Do not use any chewable tobacco products for at least 6 hours prior to                 surgery.  ____  5.  Bring all medications with you on the day of surgery if instructed.   __X__  6.  Notify your doctor if there is any change in your medical condition      (cold, fever, infections).     Do not wear jewelry, make-up, hairpins, clips or nail polish. Do not wear lotions, powders, or perfumes.  Do not shave 48 hours prior to surgery. Men may shave face and neck. Do not bring valuables to the hospital.    [facility] is not responsible for any  belongings or valuables.  Contacts, dentures/partials or body piercings may not be worn into surgery. Bring a case for your contacts, glasses or hearing aids, a denture cup will be supplied. Leave your suitcase in the car. After surgery it may be brought to your room. For patients admitted to the hospital, discharge time is determined by your treatment team.   Patients discharged the day of surgery will not be allowed to drive home.   Please read over the following fact sheets that you were given:   MRSA Information  __X__ Take these medicines the morning of surgery with A SIP OF WATER:  1. amLODipine (NORVASC  2. DULoxetine (CYMBALTA  3. gabapentin (NEURONTIN  4. oxyCODONE-acetaminophen (PERCOCET/ROXICET if needed  5.  6.  ____ Fleet Enema (as directed)   __X__ Use CHG Soap/SAGE wipes as directed  ____ Use inhalers on the day of surgery  __X__ Stop metformin/Janumet/Farxiga 2 days prior to surgery    ____ Take 1/2 of usual insulin dose the night before surgery. No insulin the morning          of surgery.   ____ Stop Blood Thinners Coumadin/Plavix/Xarelto/Pleta/Pradaxa/Eliquis/Effient/Aspirin  on   Or contact your Surgeon, Cardiologist or Medical Doctor regarding  ability to stop your blood thinners  __X__ Stop Anti-inflammatories 7 days before surgery such as Advil, Ibuprofen, Motrin,  BC or Goodies Powder, Naprosyn, Naproxen, Aleve, Aspirin    __X__ Stop all herbal supplements, fish oil or vitamin E until after surgery.    ____ Bring C-Pap to the hospital.

## 2019-02-02 NOTE — Patient Instructions (Signed)
Etoposide, VP-16 injection What is this medicine? ETOPOSIDE, VP-16 (e toe POE side) is a chemotherapy drug. It is used to treat testicular cancer, lung cancer, and other cancers. This medicine may be used for other purposes; ask your health care provider or pharmacist if you have questions. COMMON BRAND NAME(S): Etopophos, Toposar, VePesid What should I tell my health care provider before I take this medicine? They need to know if you have any of these conditions:  infection  kidney disease  liver disease  low blood counts, like low white cell, platelet, or red cell counts  an unusual or allergic reaction to etoposide, other medicines, foods, dyes, or preservatives  pregnant or trying to get pregnant  breast-feeding How should I use this medicine? This medicine is for infusion into a vein. It is administered in a hospital or clinic by a specially trained health care professional. Talk to your pediatrician regarding the use of this medicine in children. Special care may be needed. Overdosage: If you think you have taken too much of this medicine contact a poison control center or emergency room at once. NOTE: This medicine is only for you. Do not share this medicine with others. What if I miss a dose? It is important not to miss your dose. Call your doctor or health care professional if you are unable to keep an appointment. What may interact with this medicine?  aspirin  certain medications for seizures like carbamazepine, phenobarbital, phenytoin, valproic acid  cyclosporine  levamisole  warfarin This list may not describe all possible interactions. Give your health care provider a list of all the medicines, herbs, non-prescription drugs, or dietary supplements you use. Also tell them if you smoke, drink alcohol, or use illegal drugs. Some items may interact with your medicine. What should I watch for while using this medicine? Visit your doctor for checks on your progress.  This drug may make you feel generally unwell. This is not uncommon, as chemotherapy can affect healthy cells as well as cancer cells. Report any side effects. Continue your course of treatment even though you feel ill unless your doctor tells you to stop. In some cases, you may be given additional medicines to help with side effects. Follow all directions for their use. Call your doctor or health care professional for advice if you get a fever, chills or sore throat, or other symptoms of a cold or flu. Do not treat yourself. This drug decreases your body's ability to fight infections. Try to avoid being around people who are sick. This medicine may increase your risk to bruise or bleed. Call your doctor or health care professional if you notice any unusual bleeding. Talk to your doctor about your risk of cancer. You may be more at risk for certain types of cancers if you take this medicine. Do not become pregnant while taking this medicine or for at least 6 months after stopping it. Women should inform their doctor if they wish to become pregnant or think they might be pregnant. Women of child-bearing potential will need to have a negative pregnancy test before starting this medicine. There is a potential for serious side effects to an unborn child. Talk to your health care professional or pharmacist for more information. Do not breast-feed an infant while taking this medicine. Men must use a latex condom during sexual contact with a woman while taking this medicine and for at least 4 months after stopping it. A latex condom is needed even if you have had a  vasectomy. Contact your doctor right away if your partner becomes pregnant. Do not donate sperm while taking this medicine and for at least 4 months after you stop taking this medicine. Men should inform their doctors if they wish to father a child. This medicine may lower sperm counts. What side effects may I notice from receiving this medicine? Side  effects that you should report to your doctor or health care professional as soon as possible:  allergic reactions like skin rash, itching or hives, swelling of the face, lips, or tongue  low blood counts - this medicine may decrease the number of white blood cells, red blood cells and platelets. You may be at increased risk for infections and bleeding.  signs of infection - fever or chills, cough, sore throat, pain or difficulty passing urine  signs of decreased platelets or bleeding - bruising, pinpoint red spots on the skin, black, tarry stools, blood in the urine  signs of decreased red blood cells - unusually weak or tired, fainting spells, lightheadedness  breathing problems  changes in vision  mouth or throat sores or ulcers  pain, redness, swelling or irritation at the injection site  pain, tingling, numbness in the hands or feet  redness, blistering, peeling or loosening of the skin, including inside the mouth  seizures  vomiting Side effects that usually do not require medical attention (report to your doctor or health care professional if they continue or are bothersome):  diarrhea  hair loss  loss of appetite  nausea  stomach pain This list may not describe all possible side effects. Call your doctor for medical advice about side effects. You may report side effects to FDA at 1-800-FDA-1088. Where should I keep my medicine? This drug is given in a hospital or clinic and will not be stored at home. NOTE: This sheet is a summary. It may not cover all possible information. If you have questions about this medicine, talk to your doctor, pharmacist, or health care provider.  2020 [employer] Standard (2015-06-03 11:53:23) Carboplatin injection What is this medicine? CARBOPLATIN (KAR boe pla tin) is a chemotherapy drug. It targets fast dividing cells, like cancer cells, and causes these cells to die. This medicine is used to treat ovarian cancer and many other  cancers. This medicine may be used for other purposes; ask your health care provider or pharmacist if you have questions. COMMON BRAND NAME(S): Paraplatin What should I tell my health care provider before I take this medicine? They need to know if you have any of these conditions:  blood disorders  hearing problems  kidney disease  recent or ongoing radiation therapy  an unusual or allergic reaction to carboplatin, cisplatin, other chemotherapy, other medicines, foods, dyes, or preservatives  pregnant or trying to get pregnant  breast-feeding How should I use this medicine? This drug is usually given as an infusion into a vein. It is administered in a hospital or clinic by a specially trained health care professional. Talk to your pediatrician regarding the use of this medicine in children. Special care may be needed. Overdosage: If you think you have taken too much of this medicine contact a poison control center or emergency room at once. NOTE: This medicine is only for you. Do not share this medicine with others. What if I miss a dose? It is important not to miss a dose. Call your doctor or health care professional if you are unable to keep an appointment. What may interact with this medicine?  medicines for  seizures  medicines to increase blood counts like filgrastim, pegfilgrastim, sargramostim  some antibiotics like amikacin, gentamicin, neomycin, streptomycin, tobramycin  vaccines Talk to your doctor or health care professional before taking any of these medicines:  acetaminophen  aspirin  ibuprofen  ketoprofen  naproxen This list may not describe all possible interactions. Give your health care provider a list of all the medicines, herbs, non-prescription drugs, or dietary supplements you use. Also tell them if you smoke, drink alcohol, or use illegal drugs. Some items may interact with your medicine. What should I watch for while using this medicine? Your  condition will be monitored carefully while you are receiving this medicine. You will need important blood work done while you are taking this medicine. This drug may make you feel generally unwell. This is not uncommon, as chemotherapy can affect healthy cells as well as cancer cells. Report any side effects. Continue your course of treatment even though you feel ill unless your doctor tells you to stop. In some cases, you may be given additional medicines to help with side effects. Follow all directions for their use. Call your doctor or health care professional for advice if you get a fever, chills or sore throat, or other symptoms of a cold or flu. Do not treat yourself. This drug decreases your body's ability to fight infections. Try to avoid being around people who are sick. This medicine may increase your risk to bruise or bleed. Call your doctor or health care professional if you notice any unusual bleeding. Be careful brushing and flossing your teeth or using a toothpick because you may get an infection or bleed more easily. If you have any dental work done, tell your dentist you are receiving this medicine. Avoid taking products that contain aspirin, acetaminophen, ibuprofen, naproxen, or ketoprofen unless instructed by your doctor. These medicines may hide a fever. Do not become pregnant while taking this medicine. Women should inform their doctor if they wish to become pregnant or think they might be pregnant. There is a potential for serious side effects to an unborn child. Talk to your health care professional or pharmacist for more information. Do not breast-feed an infant while taking this medicine. What side effects may I notice from receiving this medicine? Side effects that you should report to your doctor or health care professional as soon as possible:  allergic reactions like skin rash, itching or hives, swelling of the face, lips, or tongue  signs of infection - fever or chills,  cough, sore throat, pain or difficulty passing urine  signs of decreased platelets or bleeding - bruising, pinpoint red spots on the skin, black, tarry stools, nosebleeds  signs of decreased red blood cells - unusually weak or tired, fainting spells, lightheadedness  breathing problems  changes in hearing  changes in vision  chest pain  high blood pressure  low blood counts - This drug may decrease the number of white blood cells, red blood cells and platelets. You may be at increased risk for infections and bleeding.  nausea and vomiting  pain, swelling, redness or irritation at the injection site  pain, tingling, numbness in the hands or feet  problems with balance, talking, walking  trouble passing urine or change in the amount of urine Side effects that usually do not require medical attention (report to your doctor or health care professional if they continue or are bothersome):  hair loss  loss of appetite  metallic taste in the mouth or changes in taste This  list may not describe all possible side effects. Call your doctor for medical advice about side effects. You may report side effects to FDA at 1-800-FDA-1088. Where should I keep my medicine? This drug is given in a hospital or clinic and will not be stored at home. NOTE: This sheet is a summary. It may not cover all possible information. If you have questions about this medicine, talk to your doctor, pharmacist, or health care provider.  2020 [employer] Standard (2007-09-16 14:38:05)

## 2019-02-02 NOTE — Telephone Encounter (Signed)
fmla forms completed for patient's wife and faxed by RN to [facility] per patient's request.

## 2019-02-03 ENCOUNTER — Other Ambulatory Visit: Payer: Self-pay | Admitting: Internal Medicine

## 2019-02-03 ENCOUNTER — Other Ambulatory Visit: Payer: Self-pay

## 2019-02-03 ENCOUNTER — Inpatient Hospital Stay: Payer: BC Managed Care – PPO

## 2019-02-03 ENCOUNTER — Inpatient Hospital Stay (HOSPITAL_BASED_OUTPATIENT_CLINIC_OR_DEPARTMENT_OTHER): Payer: BC Managed Care – PPO | Admitting: Oncology

## 2019-02-03 DIAGNOSIS — C781 Secondary malignant neoplasm of mediastinum: Secondary | ICD-10-CM

## 2019-02-03 DIAGNOSIS — C3490 Malignant neoplasm of unspecified part of unspecified bronchus or lung: Secondary | ICD-10-CM | POA: Diagnosis not present

## 2019-02-03 DIAGNOSIS — E611 Iron deficiency: Secondary | ICD-10-CM | POA: Insufficient documentation

## 2019-02-03 DIAGNOSIS — C801 Malignant (primary) neoplasm, unspecified: Secondary | ICD-10-CM | POA: Diagnosis not present

## 2019-02-03 MED ORDER — VANCOMYCIN HCL IN DEXTROSE 1-5 GM/200ML-% IV SOLN
1000.0000 mg | INTRAVENOUS | Status: AC
  Administered 2019-02-04: 07:00:00 1000 mg via INTRAVENOUS

## 2019-02-03 NOTE — Progress Notes (Signed)
[place] NOTE  Patient Care Team: Laneta Simmers, NP as PCP - General (Nurse Practitioner) Telford Nab, RN as Registered Nurse  CHIEF COMPLAINTS/PURPOSE OF CONSULTATION: Lung cancer   Oncology History Overview Note  # July 2020- SMALL CELL CA METASTATIC TO MEDIASTINAL LN [open Bx; Dr.Oaks]; [id number]; July 2nd 2020-PET- 3-4 cm [place]-pulmonary mass; no distant metastasis  # COPD  # DIAGNOSIS: SMALL CELL CA [? 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       HISTORY OF PRESENTING ILLNESS:  Kevin Mckay 68 y.o.  male with longstanding history of smoking/active smoker-with a mediastinal mass underwent open biopsy with Dr. Faith Rogue.  He is here to review the pathology/treatment plan.  Patient continues to have chronic mild shortness of breath chronic mild fatigue.  He denies any blood in stools or black or stools.  Continues to have chronic mild neuropathy attributable to history of diabetes.  He has intermittent headaches.  Review of Systems  Constitutional: Positive for malaise/fatigue. Negative for chills, diaphoresis, fever and weight loss.  HENT: Negative for nosebleeds and sore throat.   Eyes: Negative for double  vision.  Respiratory: Positive for cough, sputum production and shortness of breath. Negative for hemoptysis and wheezing.   Cardiovascular: Negative for chest pain, palpitations, orthopnea and leg swelling.  Gastrointestinal: Negative for abdominal pain, blood in stool, constipation, diarrhea, heartburn, melena, nausea and vomiting.  Genitourinary: Negative for dysuria, frequency and urgency.  Musculoskeletal: Negative for back pain and joint pain.  Skin: Negative.  Negative for itching and rash.  Neurological: Positive for tingling and headaches. Negative for dizziness, focal weakness and weakness.  Endo/Heme/Allergies: Does not bruise/bleed easily.  Psychiatric/Behavioral: Negative for depression. The patient is not nervous/anxious and does not have insomnia.      MEDICAL HISTORY:  Past Medical History:  Diagnosis Date  . Asthma   . Cerebral aneurysm   . Chronic painful diabetic neuropathy ([place])   . Depression   . Diabetes mellitus without complication ([place])   . Essential hypertension   . History of kidney stones   . Occasional tremors   . Tobacco use     SURGICAL HISTORY: Past Surgical History:  Procedure Laterality Date  . ABDOMINAL SURGERY     age 68. trauma surgery due to forklift injury- liver and spleen  . IR GENERIC HISTORICAL  05/31/2016   IR ANGIO VERTEBRAL SEL VERTEBRAL BILAT MOD SED 05/31/2016 Consuella Lose, MD [facility]-INTER[facility] RAD  . IR GENERIC HISTORICAL  05/31/2016   IR ANGIO INTRA EXTRACRAN SEL INTERNAL CAROTID BILAT MOD SED 05/31/2016 Consuella Lose, MD [facility]-INTER[facility]  . THORACOTOMY Left 01/22/2019   Procedure: PRE OP BRONCH LEFT ANTERIOR THORACOTOMY WITH BIOPSY OF MEDIASTINAL MASS;  Surgeon: [name], MD;  Location: [facility]  ORS;  Service: General;  Laterality: Left;    SOCIAL HISTORY: Social History   Socioeconomic History  . Marital status: Married    Spouse name: Not on file  . Number of children: Not on file  . Years of education: Not on file  .  Highest education level: Not on file  Occupational History  . Not on file  Social Needs  . Financial resource strain: Not on file  . Food insecurity    Worry: Not on file    Inability: Not on file  . Transportation needs    Medical: Not on file    Non-medical: Not on file  Tobacco Use  . Smoking status: Current Every Day Smoker    Packs/day: 0.50    Years: 50.00    Pack years: 25.00    Types: Cigarettes  . Smokeless tobacco: Never Used  Substance and Sexual Activity  . Alcohol use: No  . Drug use: Yes    Types: Barbituates, Marijuana  . Sexual activity: Not on file  Lifestyle  . Physical activity    Days per week: Not on file    Minutes per session: Not on file  . Stress: Not on file  Relationships  . Social Herbalist on phone: Not on file    Gets together: Not on file    Attends religious service: Not on file    Active member of club or organization: Not on file    Attends meetings of clubs or organizations: Not on file    Relationship status: Not on file  . Intimate partner violence    Fear of current or ex partner: Not on file    Emotionally abused: Not on file    Physically abused: Not on file    Forced sexual activity: Not on file  Other Topics Concern  . Not on file  Social History Narrative   Lives in [place]; wife/ son/grandson [custody]; smoker; vending business; hx of alcoholism- quit at 19.     FAMILY HISTORY: Family History  Problem Relation Age of Onset  . Lymphoma Father   . Liver cancer Paternal Uncle   . Lung cancer Paternal Uncle   . Lung cancer Maternal Uncle     ALLERGIES:  is allergic to amoxicillin and codeine.  MEDICATIONS:  Current Outpatient Medications  Medication Sig Dispense Refill  . amLODipine (NORVASC) 10 MG tablet Take 10 mg by mouth daily.    . Budesonide-Formoterol Fumarate (SYMBICORT IN) Inhale into the lungs 2 (two) times daily.     . DULoxetine (CYMBALTA) 30 MG capsule Take 30 mg by mouth daily.    Marland Kitchen  gabapentin (NEURONTIN) 600 MG tablet Take 600 mg by mouth every 6 (six) hours as needed (pain. Not to exceed 3600 mg).     Marland Kitchen glipiZIDE (GLUCOTROL XL) 10 MG 24 hr tablet Take 10 mg by mouth daily with breakfast.    . hydrochlorothiazide (HYDRODIURIL) 25 MG tablet Take 1 tablet by mouth daily.    Marland Kitchen lovastatin (MEVACOR) 20 MG tablet Take 20 mg by mouth every evening.    . metFORMIN (GLUCOPHAGE) 1000 MG tablet Take 1,000 mg by mouth 2 (two) times daily with a meal.    . prazosin (MINIPRESS) 1 MG capsule Take 1 mg by mouth at bedtime.    . sitaGLIPtin (JANUVIA) 25 MG tablet Take 25 mg by mouth daily.    Marland Kitchen lidocaine-prilocaine (EMLA) cream Apply 1 application topically as needed. 30-45 mins prior to port access.  30 g 0  . ondansetron (ZOFRAN) 8 MG tablet Take 1 tablet (8 mg total) by mouth every 8 (eight) hours as needed for nausea or vomiting (start 3 days; after chemo). 40 tablet 1  . oxyCODONE-acetaminophen (PERCOCET/ROXICET) 5-325 MG tablet Take 1-2 tablets by mouth every 4 (four) hours as needed for moderate pain. 30 tablet 0  . prochlorperazine (COMPAZINE) 10 MG tablet Take 1 tablet (10 mg total) by mouth every 6 (six) hours as needed for nausea or vomiting. 40 tablet 1  . traMADol (ULTRAM) 50 MG tablet Take 2 tablets (100 mg total) by mouth every 6 (six) hours. 30 tablet 0   No current facility-administered medications for this visit.       Marland Kitchen  PHYSICAL EXAMINATION: ECOG PERFORMANCE STATUS: 0 - Asymptomatic  Vitals:   01/29/19 0950  BP: (!) 145/69  Pulse: 69  Resp: 16  Temp: (!) 97.5 F (36.4 C)   Filed Weights   01/29/19 0950  Weight: 145 lb (65.8 kg)    Physical Exam  Constitutional: He is oriented to person, place, and time and well-developed, well-nourished, and in no distress.  HENT:  Head: Normocephalic and atraumatic.  Mouth/Throat: Oropharynx is clear and moist. No oropharyngeal exudate.  Eyes: Pupils are equal, round, and reactive to light.  Neck: Normal range of  motion. Neck supple.  Cardiovascular: Normal rate and regular rhythm.  Pulmonary/Chest: No respiratory distress. He has no wheezes.  Decreased air entry bilaterally.  Abdominal: Soft. Bowel sounds are normal. He exhibits no distension and no mass. There is no abdominal tenderness. There is no rebound and no guarding.  Musculoskeletal: Normal range of motion.        General: No tenderness or edema.  Neurological: He is alert and oriented to person, place, and time.  Skin: Skin is warm.  Psychiatric: Affect normal.    LABORATORY DATA:  I have reviewed the data as listed Lab Results  Component Value Date   WBC 8.8 01/29/2019   HGB 9.0 (L) 01/29/2019   HCT 29.6 (L) 01/29/2019   MCV 76.7 (L) 01/29/2019   PLT 239 01/29/2019   Recent Labs    01/09/19 1539 01/22/19 1433 01/29/19 0927  NA 138 136 136  K 3.8 3.5 4.6  CL 99 104 101  CO2 26 25 25   GLUCOSE 160* 279* 186*  BUN 14 12 21   CREATININE 1.21 1.05 1.11  CALCIUM 9.5 8.4* 9.1  GFRNONAA >60 >60 >60  GFRAA >60 >60 >60  PROT 7.2  --   --   ALBUMIN 3.9  --   --   AST 17  --   --   ALT 12  --   --   ALKPHOS 64  --   --   BILITOT 0.4  --   --     RADIOGRAPHIC STUDIES: I have personally reviewed the radiological images as listed and agreed with the findings in the report. Dg Chest 2 View  Result Date: 01/23/2019 CLINICAL DATA:  Postop check; EXAM: CHEST - 2 VIEW COMPARISON:  Radiograph 01/23/2019 at 7 56 hours FINDINGS: Normal cardiac silhouette. LEFT-sided chest tube in place without pneumothorax. Lung bases are clear. Band of atelectasis in the RIGHT midlung. No interval change. IMPRESSION: 1. No interval change. 2. LEFT chest tube in place without pneumothorax. 3. RIGHT midlung atelectasis. Electronically Signed   By: Suzy Bouchard M.D.   On: 01/23/2019 14:24   Dg Chest 2 View  Result Date: 01/16/2019 CLINICAL DATA:  Preoperative radiograph. EXAM:  CHEST - 2 VIEW COMPARISON:  September 04, 2018 FINDINGS: Cardiomediastinal  silhouette is normal. Mediastinal contours appear intact. Calcific atherosclerotic disease of the aorta. There is no evidence of focal airspace consolidation, pleural effusion or pneumothorax. Osseous structures are without acute abnormality. Soft tissues are grossly normal. IMPRESSION: No active cardiopulmonary disease. Electronically Signed   By: Fidela Salisbury M.D.   On: 01/16/2019 11:52   Dg Chest Port 1 View  Result Date: 01/23/2019 CLINICAL DATA:  Postop check EXAM: PORTABLE CHEST 1 VIEW COMPARISON:  01/22/2019, 01/16/2019 FINDINGS: There is a left-sided chest tube in satisfactory position. There is no pneumothorax. There is right lower lung discoid atelectasis. There is no pleural effusion or pneumothorax. There is left perihilar airspace disease likely reflecting atelectasis. Persistent left AP window mass again noted. There is no acute osseous abnormality. IMPRESSION: 1. Left-sided chest tube in satisfactory position.  No pneumothorax. Electronically Signed   By: Kathreen Devoid   On: 01/23/2019 09:02   Dg Chest Port 1 View  Result Date: 01/22/2019 CLINICAL DATA:  Patient status post left anterior thoracotomy and biopsy of the mediastinal mass today. EXAM: PORTABLE CHEST 1 VIEW COMPARISON:  PA and lateral chest 01/16/2019.  CT chest 11/25/2018. FINDINGS: Drain in the left pleural space is now in place. Lungs are clear. No pneumothorax or pleural fluid. Heart size is normal. Known mediastinal mass is better seen on prior CT scan. No acute or focal bony abnormality. IMPRESSION: No acute disease. Negative for pneumothorax with a pleural drain on the left in place. Electronically Signed   By: Inge Rise M.D.   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  TX N2 M0.  I reviewed the pathology and staging with the patient and family in detail.   #  #Discussed chemo-radiation therapy for limited stage small cell lung cancer includes carbo etoposide-every 3 weeks with concurrent radiation.  Discussed the goal of treatment is cure; although unfortunately quite small in the range of 10%.  Will check into the option of clinical trials if available at [facility]-incorporation of immunotherapy.  Discussed the potential side effects including but not limited to-increasing fatigue, nausea vomiting, diarrhea, hair loss, sores in the mouth, increase risk of infection and also neuropathy.   # Chemotherapy education; port placement. Antiemetics-Zofran and Compazine; EMLA cream sent to pharmacy.  Also recommend MRI of the brain given the risk of CNS involvement.  # Anemia-hemoglobin 9/iron studies-ferritin 22 saturation 5% suggestive of iron deficiency.  Etiology is unclear.  Patient need further work-up including EGD colonoscopy.  No obvious lesions noted on PET scan.  We will plan IV iron infusions with chemotherapies.  # 40 minutes face-to-face with the patient discussing the above plan of care; more than 50% of time spent on prognosis/ natural history; counseling and coordination.  # DISPOSITION: # chemo ed/ port placement # MRI Brain asap # referral to Dr.Crystal for RT ASAP # follow up with on Aug 17th MD/labs- cbc/cmp/LDH- carbo-etop [days-1-2-3] Dr.B  All questions were answered. The patient knows to call the clinic with any problems, questions or concerns.    Cammie Sickle, MD 02/03/2019 8:30 AM

## 2019-02-03 NOTE — Progress Notes (Signed)
[facility]  Telephone:([phone] [phone]  Patient Care Team: Laneta Simmers, NP as PCP - General (Nurse Practitioner) Telford Nab, RN as Registered Nurse   Name of the patient: Kevin Mckay  [id number]  08/02/1950   Date of visit: 02/03/19  Diagnosis-lung cancer  Chief complaint/Reason for visit- Initial Meeting for [facility], preparing for starting chemotherapy  Heme/Onc history:  Oncology History Overview Note  # July 2020- SMALL CELL CA METASTATIC TO MEDIASTINAL LN [open Bx; Dr.Oaks]; TxN2M0; July 2nd 2020-PET- 3-4 cm Aorto-pulmonary mass; no distant metastasis  # COPD/  DM-2- on OHA/ smoker/ PVD/peripheral neuropathy  # History of alcohol abuse/quit 2014/ Hx of abdominal trauma [at 21y]  # DIAGNOSIS: SMALL CELL CA [? 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      Interval history-Mr. Kevin Mckay is a 68 year old male who presents to chemo care clinic today for initial meeting in preparation for starting chemotherapy. I introduced the chemo care clinic and we discussed that the role of the clinic is to assist those who are at an increased risk of emergency room visits and/or complications during the course of chemotherapy treatment. We discussed  that the increased risk takes into account factors such as age, performance status, and co-morbidities. We also discussed that for some, this might include barriers to care such as not having a primary care provider, lack of insurance/transportation, or not being able to afford medications. We discussed that the goal of the program is to help prevent unplanned ER visits and help reduce complications during chemotherapy. We do this by discussing specific risk factors to each individual and identifying ways that we can help improve these risk factors and reduce barriers to care.   ECOG FS:1 - Symptomatic but completely ambulatory  Review of systems- Review of Systems  Constitutional: Positive for malaise/fatigue. Negative for chills, fever and weight loss.  HENT: Negative for congestion, ear pain and tinnitus.   Eyes: Negative.  Negative for blurred vision and double vision.  Respiratory: Positive for cough and shortness of breath. Negative for sputum production.   Cardiovascular: Negative.  Negative for chest pain, palpitations and leg swelling.  Gastrointestinal: Negative.  Negative for abdominal pain, constipation, diarrhea, nausea and vomiting.  Genitourinary: Negative for dysuria, frequency and urgency.  Musculoskeletal: Negative for back pain and falls.  Skin: Negative.  Negative for rash.  Neurological: Positive for weakness. Negative for headaches.  Endo/Heme/Allergies: Negative.  Does not bruise/bleed easily.  Psychiatric/Behavioral: Negative for depression. The patient is nervous/anxious. The patient does not have insomnia.      Current treatment-scheduled to begin carbo/etoposide day 1 through day 3 every 3 weeks on 02/09/2019.  He is scheduled for Port-A-Cath placement tomorrow and CT simulation on Friday.  Allergies  Allergen Reactions   Amoxicillin Hives  Codeine Nausea Only    Past Medical History:  Diagnosis Date   Asthma    Cerebral aneurysm    Chronic painful  diabetic neuropathy ([place])    Depression    Diabetes mellitus without complication ([place])    Essential hypertension    History of kidney stones    Occasional tremors    Tobacco use     Past Surgical History:  Procedure Laterality Date   ABDOMINAL SURGERY     age 68. trauma surgery due to forklift injury- liver and spleen   IR GENERIC HISTORICAL  05/31/2016   IR ANGIO VERTEBRAL SEL VERTEBRAL BILAT MOD SED 05/31/2016 Consuella Lose, MD [facility]   IR GENERIC HISTORICAL  05/31/2016   IR ANGIO INTRA EXTRACRAN SEL INTERNAL CAROTID BILAT MOD SED 05/31/2016 Consuella Lose, MD [facility]   THORACOTOMY Left 01/22/2019   Procedure: PRE OP BRONCH LEFT ANTERIOR THORACOTOMY WITH BIOPSY OF MEDIASTINAL MASS;  Surgeon: [name], MD;  Location: [facility] ORS;  Service: General;  Laterality: Left;    Social History   Socioeconomic History   Marital status: Married    Spouse name: Not on file   Number of children: Not on file   Years of education: Not on file   Highest education level: Not on file  Occupational History   Not on file  Social Needs   Financial resource strain: Not on file   Food insecurity    Worry: Not on file    Inability: Not on file   Transportation needs    Medical: Not on file    Non-medical: Not on file  Tobacco Use   Smoking status: Current Every Day Smoker    Packs/day: 0.50    Years: 50.00    Pack years: 25.00    Types: Cigarettes   Smokeless tobacco: Never Used  Substance and Sexual Activity   Alcohol use: No   Drug use: Yes    Types: Barbituates, Marijuana   Sexual activity: Not on file  Lifestyle   Physical activity    Days per week: Not on file    Minutes per session: Not on file   Stress: Not on file  Relationships   Social connections    Talks on phone: Not on file    Gets together: Not on file    Attends religious service: Not on file    Active member of club or organization: Not on file    Attends meetings of  clubs or organizations: Not on file    Relationship status: Not on file   Intimate partner violence    Fear of current or ex partner: Not on file    Emotionally abused: Not on file    Physically abused: Not on file    Forced sexual activity: Not on file  Other Topics Concern   Not on file  Social History Narrative   Lives in [place]; wife/ son/grandson [custody]; smoker; vending business; hx of alcoholism- quit at 34.     Family History  Problem Relation Age of Onset   Lymphoma Father    Liver cancer Paternal Uncle    Lung cancer Paternal Uncle    Lung cancer Maternal Uncle      Current Outpatient Medications:    amLODipine (NORVASC) 10 MG tablet, Take 10 mg by mouth daily., Disp: , Rfl:    Budesonide-Formoterol Fumarate (SYMBICORT IN), Inhale into the lungs 2 (two) times daily. , Disp: , Rfl:    DULoxetine (CYMBALTA) 30 MG capsule, Take 30  mg by mouth daily., Disp: , Rfl:    gabapentin (NEURONTIN) 600 MG tablet, Take 600 mg by mouth every 6 (six) hours as needed (pain. Not to exceed 3600 mg). , Disp: , Rfl:    glipiZIDE (GLUCOTROL XL) 10 MG 24 hr tablet, Take 10 mg by mouth daily with breakfast., Disp: , Rfl:    hydrochlorothiazide (HYDRODIURIL) 25 MG tablet, Take 1 tablet by mouth daily., Disp: , Rfl:    lidocaine-prilocaine (EMLA) cream, Apply 1 application topically as needed. 30-45 mins prior to port access., Disp: 30 g, Rfl: 0   lovastatin (MEVACOR) 20 MG tablet, Take 20 mg by mouth every evening., Disp: , Rfl:    metFORMIN (GLUCOPHAGE) 1000 MG tablet, Take 1,000 mg by mouth 2 (two) times daily with a meal., Disp: , Rfl:    ondansetron (ZOFRAN) 8 MG tablet, Take 1 tablet (8 mg total) by mouth every 8 (eight) hours as needed for nausea or vomiting (start 3 days; after chemo)., Disp: 40 tablet, Rfl: 1   oxyCODONE-acetaminophen (PERCOCET/ROXICET) 5-325 MG tablet, Take 1-2 tablets by mouth every 4 (four) hours as needed for moderate pain., Disp: 30 tablet, Rfl:  0   prazosin (MINIPRESS) 1 MG capsule, Take 1 mg by mouth at bedtime., Disp: , Rfl:    prochlorperazine (COMPAZINE) 10 MG tablet, Take 1 tablet (10 mg total) by mouth every 6 (six) hours as needed for nausea or vomiting., Disp: 40 tablet, Rfl: 1   sitaGLIPtin (JANUVIA) 25 MG tablet, Take 25 mg by mouth daily., Disp: , Rfl:    traMADol (ULTRAM) 50 MG tablet, Take 2 tablets (100 mg total) by mouth every 6 (six) hours., Disp: 30 tablet, Rfl: 0  Physical exam: There were no vitals filed for this visit. Physical Exam Constitutional:      Appearance: Normal appearance.  HENT:     Head: Normocephalic and atraumatic.  Eyes:     Pupils: Pupils are equal, round, and reactive to light.  Neck:     Musculoskeletal: Normal range of motion.  Cardiovascular:     Rate and Rhythm: Normal rate and regular rhythm.     Heart sounds: Normal heart sounds. No murmur.  Pulmonary:     Effort: Pulmonary effort is normal.     Breath sounds: Normal breath sounds. No wheezing.  Abdominal:     General: Bowel sounds are normal. There is no distension.     Palpations: Abdomen is soft.     Tenderness: There is no abdominal tenderness.  Musculoskeletal: Normal range of motion.  Skin:    General: Skin is warm and dry.     Findings: No rash.  Neurological:     Mental Status: He is alert and oriented to person, place, and time.  Psychiatric:        Judgment: Judgment normal.      CMP Latest Ref Rng & Units 01/29/2019  Glucose 70 - 99 mg/dL 186(H)  BUN 8 - 23 mg/dL 21  Creatinine 0.61 - 1.24 mg/dL 1.11  Sodium 135 - 145 mmol/L 136  Potassium 3.5 - 5.1 mmol/L 4.6  Chloride 98 - 111 mmol/L 101  CO2 22 - 32 mmol/L 25  Calcium 8.9 - 10.3 mg/dL 9.1  Total Protein 6.5 - 8.1 g/dL -  Total Bilirubin 0.3 - 1.2 mg/dL -  Alkaline Phos 38 - 126 U/L -  AST 15 - 41 U/L -  ALT 0 - 44 U/L -   CBC Latest Ref Rng & Units 01/29/2019  WBC 4.0 -  10.5 K/uL 8.8  Hemoglobin 13.0 - 17.0 g/dL 9.0(L)  Hematocrit 39.0 - 52.0 %  29.6(L)  Platelets 150 - 400 K/uL 239    No images are attached to the encounter.  Dg Chest 2 View  Result Date: 01/23/2019 CLINICAL DATA:  Postop check; EXAM: CHEST - 2 VIEW COMPARISON:  Radiograph 01/23/2019 at 7 56 hours FINDINGS: Normal cardiac silhouette. LEFT-sided chest tube in place without pneumothorax. Lung bases are clear. Band of atelectasis in the RIGHT midlung. No interval change. IMPRESSION: 1. No interval change. 2. LEFT chest tube in place without pneumothorax. 3. RIGHT midlung atelectasis. Electronically Signed   By: Suzy Bouchard M.D.   On: 01/23/2019 14:24   Dg Chest 2 View  Result Date: 01/16/2019 CLINICAL DATA:  Preoperative radiograph. EXAM: CHEST - 2 VIEW COMPARISON:  September 04, 2018 FINDINGS: Cardiomediastinal silhouette is normal. Mediastinal contours appear intact. Calcific atherosclerotic disease of the aorta. There is no evidence of focal airspace consolidation, pleural effusion or pneumothorax. Osseous structures are without acute abnormality. Soft tissues are grossly normal. IMPRESSION: No active cardiopulmonary disease. Electronically Signed   By: Fidela Salisbury M.D.   On: 01/16/2019 11:52   Dg Chest Port 1 View  Result Date: 01/23/2019 CLINICAL DATA:  Postop check EXAM: PORTABLE CHEST 1 VIEW COMPARISON:  01/22/2019, 01/16/2019 FINDINGS: There is a left-sided chest tube in satisfactory position. There is no pneumothorax. There is right lower lung discoid atelectasis. There is no pleural effusion or pneumothorax. There is left perihilar airspace disease likely reflecting atelectasis. Persistent left AP window mass again noted. There is no acute osseous abnormality. IMPRESSION: 1. Left-sided chest tube in satisfactory position.  No pneumothorax. Electronically Signed   By: Kathreen Devoid   On: 01/23/2019 09:02   Dg Chest Port 1 View  Result Date: 01/22/2019 CLINICAL DATA:  Patient status post left anterior thoracotomy and biopsy of the mediastinal mass today.  EXAM: PORTABLE CHEST 1 VIEW COMPARISON:  PA and lateral chest 01/16/2019.  CT chest 11/25/2018. FINDINGS: Drain in the left pleural space is now in place. Lungs are clear. No pneumothorax or pleural fluid. Heart size is normal. Known mediastinal mass is better seen on prior CT scan. No acute or focal bony abnormality. IMPRESSION: No acute disease. Negative for pneumothorax with a pleural drain on the left in place. Electronically Signed   By: Inge Rise M.D.   On: 01/22/2019 13:32     Assessment and plan- Patient is a 68 y.o. male who presents to [facility] for initial meeting in preparation for starting chemotherapy for the treatment of small cell lung cancer.   Cancer-small cell lung cancer.  This was discovered by low-dose CT screening program on 11/25/2018.  Had subsequent PET scan on 12/25/2018 which showed hypermetabolic activity to soft tissue mass compatible with malignancy.  It was recommended to obtain a biopsy.  Based on the location of the mass it required a left anterior thoracotomy with biopsy of mediastinal mass.  This required a chest tube and overnight hospital stay.  Biopsy revealed limited stage small cell cancer likely lung primary.  Discussed plan including chemoradiation with carbo/etoposide every 3 weeks.  Initial consult with Dr. Donella Stade was yesterday where he has planned to deliver 6000 CG Y in 30 fractions to his chest.  CT simulation is scheduled for Friday.     Chemo Care Clinic/High Risk for ER/Hospitalization during chemotherapy- We discussed the role of the chemo care clinic and identified patient specific risk factors. I discussed that  patient was identified as high risk primarily based on: Chronic health conditions.  Mr. Elmer Bales is followed by his PCP, Magdalene Molly, NP.  He also is followed by pulmonary (Dr. Lanney Gins) for emphysema and neurology (Dr. Melrose Nakayama) for neuropathy and history of brain aneurysm. He was last evaluated by his PCP on 09/04/2018 for  pneumonia.   Co-morbidties: include neuropathy, hypertension, diabetes, hyperlipidemia and emphysema.  Has history of brain aneurysm/headache.  3. Social Determinants of Health- we discussed that social determinants of health may have significant impacts on health and outcomes for cancer patients.  Today we discussed specific social determinants of performance status, alcohol use, depression, financial needs, food insecurity, housing, interpersonal violence, social connections, stress, tobacco use, and transportation.  After lengthy discussion the following were identified as areas of need: We spoke at length about services offered here at the cancer center including financial support for medications and household bills, food support with care packages and coupons to local grocery stores and gas vouchers for transportation.  Right now, he denies any need for support financially.  He is currently retired.  He lives at home with his wife and has 5 living children.  His youngest child is 28 and lives at home with them.  He recently adopted a grandson.  He has reliable transportation and feels that he will be able to drive himself to and from chemo/radiation.  He has picked up prescribed medications from pharmacy for nausea along with EMLA cream.  We spoke about stress and managing stress including healthy eating and to continue to exercise.  He has specific questions regarding "how do we know the chemo/radiation is working".  We spoke at length about repeat imaging after his 4 cycles of carbo/etoposide and radiation.  We spoke about his smoking history.  He continues to smoke 3 to 5 cigarettes/day which is a significant decrease from previous.  He is not interested in any additional smoking support at this time.  He feels he can do it on his own.  4. Co-morbidities Complicating Care: include neuropathy, hypertension, diabetes, hyperlipidemia and emphysema.  Has history of brain aneurysm/headache.  I did  introduce palliative care but do not believe he needs an appointment at this time.  We can reevaluate in the upcoming months.    We also discussed the role of the Symptom Management Clinic at [facility] for acute issues and methods of contacting clinic/provider. He denies needing specific assistance at this time and He will be followed by Telford Nab, RN (Nurse Navigator).    Visit Diagnosis 1. 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 He also understands that He can call clinic at any time with any questions, concerns, or complaints.   A total of (15) minutes of face-to-face time was spent with this patient with greater than 50% of that time in counseling and care-coordination.  Faythe Casa, NP, AGNP-C [place] [facility] [place] (work cell) [phone] (office)  CC: Dr. Rogue Bussing

## 2019-02-04 ENCOUNTER — Ambulatory Visit
Admission: RE | Admit: 2019-02-04 | Discharge: 2019-02-04 | Disposition: A | Discharge status: Home / Self Care | Payer: BC Managed Care – PPO | Attending: Cardiothoracic Surgery | Admitting: Cardiothoracic Surgery

## 2019-02-04 ENCOUNTER — Encounter
Admission: RE | Disposition: A | Discharge status: Home / Self Care | Payer: Self-pay | Source: Home / Self Care | Attending: Cardiothoracic Surgery

## 2019-02-04 ENCOUNTER — Encounter: Payer: Self-pay | Admitting: *Deleted

## 2019-02-04 ENCOUNTER — Other Ambulatory Visit: Payer: Self-pay

## 2019-02-04 ENCOUNTER — Ambulatory Visit: Payer: BC Managed Care – PPO | Admitting: Certified Registered Nurse Anesthetist

## 2019-02-04 ENCOUNTER — Ambulatory Visit: Payer: BC Managed Care – PPO

## 2019-02-04 ENCOUNTER — Ambulatory Visit
Admission: RE | Admit: 2019-02-04 | Discharge: 2019-02-04 | Disposition: A | Discharge status: Home / Self Care | Payer: BC Managed Care – PPO | Source: Home / Self Care | Attending: Cardiothoracic Surgery | Admitting: Cardiothoracic Surgery

## 2019-02-04 ENCOUNTER — Ambulatory Visit
Admission: RE | Admit: 2019-02-04 | Discharge: 2019-02-04 | Disposition: A | Discharge status: Home / Self Care | Payer: BC Managed Care – PPO | Source: Ambulatory Visit | Attending: Internal Medicine | Admitting: Internal Medicine

## 2019-02-04 DIAGNOSIS — F17200 Nicotine dependence, unspecified, uncomplicated: Secondary | ICD-10-CM | POA: Insufficient documentation

## 2019-02-04 DIAGNOSIS — C781 Secondary malignant neoplasm of mediastinum: Secondary | ICD-10-CM | POA: Insufficient documentation

## 2019-02-04 DIAGNOSIS — Z7984 Long term (current) use of oral hypoglycemic drugs: Secondary | ICD-10-CM | POA: Insufficient documentation

## 2019-02-04 DIAGNOSIS — J9859 Other diseases of mediastinum, not elsewhere classified: Secondary | ICD-10-CM

## 2019-02-04 DIAGNOSIS — R918 Other nonspecific abnormal finding of lung field: Secondary | ICD-10-CM | POA: Diagnosis not present

## 2019-02-04 DIAGNOSIS — I10 Essential (primary) hypertension: Secondary | ICD-10-CM | POA: Diagnosis not present

## 2019-02-04 DIAGNOSIS — C801 Malignant (primary) neoplasm, unspecified: Secondary | ICD-10-CM | POA: Insufficient documentation

## 2019-02-04 DIAGNOSIS — E118 Type 2 diabetes mellitus with unspecified complications: Secondary | ICD-10-CM | POA: Diagnosis not present

## 2019-02-04 DIAGNOSIS — C3490 Malignant neoplasm of unspecified part of unspecified bronchus or lung: Secondary | ICD-10-CM | POA: Diagnosis not present

## 2019-02-04 DIAGNOSIS — J4520 Mild intermittent asthma, uncomplicated: Secondary | ICD-10-CM | POA: Diagnosis not present

## 2019-02-04 HISTORY — PX: PORTACATH PLACEMENT: SHX2246

## 2019-02-04 LAB — URINE DRUG SCREEN, QUALITATIVE (ARMC ONLY)
Amphetamines, Ur Screen: NOT DETECTED
Barbiturates, Ur Screen: NOT DETECTED
Benzodiazepine, Ur Scrn: NOT DETECTED
Cannabinoid 50 Ng, Ur ~~LOC~~: POSITIVE — AB
Cocaine Metabolite,Ur ~~LOC~~: NOT DETECTED
MDMA (Ecstasy)Ur Screen: NOT DETECTED
Methadone Scn, Ur: NOT DETECTED
Opiate, Ur Screen: POSITIVE — AB
Phencyclidine (PCP) Ur S: NOT DETECTED
Tricyclic, Ur Screen: NOT DETECTED

## 2019-02-04 LAB — GLUCOSE, CAPILLARY
Glucose-Capillary: 188 mg/dL — ABNORMAL HIGH (ref 70–99)
Glucose-Capillary: 217 mg/dL — ABNORMAL HIGH (ref 70–99)

## 2019-02-04 SURGERY — INSERTION, TUNNELED CENTRAL VENOUS DEVICE, WITH PORT
Anesthesia: General | Laterality: Right

## 2019-02-04 MED ORDER — CHLORHEXIDINE GLUCONATE CLOTH 2 % EX PADS: 6.0000 | MEDICATED_PAD | Freq: Once | CUTANEOUS | Status: DC

## 2019-02-04 MED ORDER — FENTANYL CITRATE (PF) 100 MCG/2ML IJ SOLN: 25.0000 ug | INTRAMUSCULAR | Status: DC | PRN

## 2019-02-04 MED ORDER — ONDANSETRON HCL 4 MG/2ML IJ SOLN
INTRAMUSCULAR | Status: AC
  Filled 2019-02-04: qty 2

## 2019-02-04 MED ORDER — VANCOMYCIN HCL IN DEXTROSE 1-5 GM/200ML-% IV SOLN
INTRAVENOUS | Status: AC
  Administered 2019-02-04: 1000 mg via INTRAVENOUS
  Filled 2019-02-04: qty 200

## 2019-02-04 MED ORDER — FENTANYL CITRATE (PF) 100 MCG/2ML IJ SOLN
INTRAMUSCULAR | Status: DC | PRN
  Administered 2019-02-04: 50 ug via INTRAVENOUS
  Administered 2019-02-04 (×2): 12.5 ug via INTRAVENOUS
  Administered 2019-02-04: 25 ug via INTRAVENOUS

## 2019-02-04 MED ORDER — GLYCOPYRROLATE 0.2 MG/ML IJ SOLN
INTRAMUSCULAR | Status: AC
  Filled 2019-02-04: qty 1

## 2019-02-04 MED ORDER — LIDOCAINE HCL (PF) 2 % IJ SOLN
INTRAMUSCULAR | Status: AC
  Filled 2019-02-04: qty 10

## 2019-02-04 MED ORDER — LIDOCAINE HCL 1 % IJ SOLN
INTRAMUSCULAR | Status: DC | PRN
  Administered 2019-02-04: 10 mL

## 2019-02-04 MED ORDER — ALBUTEROL SULFATE HFA 108 (90 BASE) MCG/ACT IN AERS
INHALATION_SPRAY | RESPIRATORY_TRACT | Status: DC | PRN
  Administered 2019-02-04: 6 via RESPIRATORY_TRACT

## 2019-02-04 MED ORDER — ALBUTEROL SULFATE HFA 108 (90 BASE) MCG/ACT IN AERS
INHALATION_SPRAY | RESPIRATORY_TRACT | Status: AC
  Filled 2019-02-04: qty 6.7

## 2019-02-04 MED ORDER — PHENYLEPHRINE HCL (PRESSORS) 10 MG/ML IV SOLN
INTRAVENOUS | Status: DC | PRN
  Administered 2019-02-04 (×5): 100 ug via INTRAVENOUS

## 2019-02-04 MED ORDER — MIDAZOLAM HCL 2 MG/2ML IJ SOLN
INTRAMUSCULAR | Status: AC
  Filled 2019-02-04: qty 2

## 2019-02-04 MED ORDER — SUCCINYLCHOLINE CHLORIDE 20 MG/ML IJ SOLN
INTRAMUSCULAR | Status: AC
  Filled 2019-02-04: qty 1

## 2019-02-04 MED ORDER — MIDAZOLAM HCL 2 MG/2ML IJ SOLN
INTRAMUSCULAR | Status: DC | PRN
  Administered 2019-02-04: 2 mg via INTRAVENOUS

## 2019-02-04 MED ORDER — GLYCOPYRROLATE 0.2 MG/ML IJ SOLN
INTRAMUSCULAR | Status: DC | PRN
  Administered 2019-02-04: 0.2 mg via INTRAVENOUS

## 2019-02-04 MED ORDER — FENTANYL CITRATE (PF) 100 MCG/2ML IJ SOLN
INTRAMUSCULAR | Status: AC
  Filled 2019-02-04: qty 2

## 2019-02-04 MED ORDER — ONDANSETRON HCL 4 MG/2ML IJ SOLN
INTRAMUSCULAR | Status: DC | PRN
  Administered 2019-02-04: 4 mg via INTRAVENOUS

## 2019-02-04 MED ORDER — ONDANSETRON HCL 4 MG/2ML IJ SOLN: 4.0000 mg | Freq: Once | INTRAMUSCULAR | Status: DC | PRN

## 2019-02-04 MED ORDER — PROPOFOL 10 MG/ML IV BOLUS
INTRAVENOUS | Status: AC
  Filled 2019-02-04: qty 20

## 2019-02-04 MED ORDER — LIDOCAINE HCL (PF) 1 % IJ SOLN
INTRAMUSCULAR | Status: AC
  Filled 2019-02-04: qty 30

## 2019-02-04 MED ORDER — LIDOCAINE HCL (CARDIAC) PF 100 MG/5ML IV SOSY
PREFILLED_SYRINGE | INTRAVENOUS | Status: DC | PRN
  Administered 2019-02-04: 100 mg via INTRAVENOUS

## 2019-02-04 MED ORDER — PHENYLEPHRINE HCL (PRESSORS) 10 MG/ML IV SOLN
INTRAVENOUS | Status: AC
  Filled 2019-02-04: qty 1

## 2019-02-04 MED ORDER — EPHEDRINE SULFATE 50 MG/ML IJ SOLN
INTRAMUSCULAR | Status: DC | PRN
  Administered 2019-02-04 (×4): 10 mg via INTRAVENOUS

## 2019-02-04 MED ORDER — SODIUM CHLORIDE 0.9 % IV SOLN
INTRAVENOUS | Status: DC | PRN
  Administered 2019-02-04: 30 mL via INTRAMUSCULAR

## 2019-02-04 MED ORDER — GADOBUTROL 1 MMOL/ML IV SOLN
6.0000 mL | Freq: Once | INTRAVENOUS | Status: AC | PRN
  Administered 2019-02-04: 13:00:00 6 mL via INTRAVENOUS

## 2019-02-04 MED ORDER — EPHEDRINE SULFATE 50 MG/ML IJ SOLN
INTRAMUSCULAR | Status: AC
  Filled 2019-02-04: qty 1

## 2019-02-04 MED ORDER — HEPARIN SODIUM (PORCINE) 5000 UNIT/ML IJ SOLN
INTRAMUSCULAR | Status: AC
  Filled 2019-02-04: qty 1

## 2019-02-04 MED ORDER — SODIUM CHLORIDE 0.9 % IV SOLN
INTRAVENOUS | Status: DC
  Administered 2019-02-04: 07:00:00 via INTRAVENOUS

## 2019-02-04 MED ORDER — FAMOTIDINE 20 MG PO TABS
ORAL_TABLET | ORAL | Status: AC
  Administered 2019-02-04: 20 mg via ORAL
  Filled 2019-02-04: qty 1

## 2019-02-04 MED ORDER — PROPOFOL 10 MG/ML IV BOLUS
INTRAVENOUS | Status: DC | PRN
  Administered 2019-02-04: 100 mg via INTRAVENOUS

## 2019-02-04 MED ORDER — FAMOTIDINE 20 MG PO TABS
20.0000 mg | ORAL_TABLET | Freq: Once | ORAL | Status: AC
  Administered 2019-02-04: 20 mg via ORAL

## 2019-02-04 SURGICAL SUPPLY — 43 items
BAG DECANTER FOR FLEXI CONT (MISCELLANEOUS) ×2 IMPLANT
BLADE SURG 15 STRL LF DISP TIS (BLADE) ×1 IMPLANT
BLADE SURG 15 STRL SS (BLADE) ×1
BLADE SURG SZ11 CARB STEEL (BLADE) ×2 IMPLANT
CANISTER SUCT 1200ML W/VALVE (MISCELLANEOUS) ×2 IMPLANT
CHLORAPREP W/TINT 26 (MISCELLANEOUS) ×2 IMPLANT
COVER LIGHT HANDLE STERIS (MISCELLANEOUS) ×4 IMPLANT
DRAPE C-ARM XRAY 36X54 (DRAPES) ×2 IMPLANT
DRSG TEGADERM 2-3/8X2-3/4 SM (GAUZE/BANDAGES/DRESSINGS) ×2 IMPLANT
DRSG TEGADERM 4X4.75 (GAUZE/BANDAGES/DRESSINGS) ×2 IMPLANT
DRSG TELFA 4X3 1S NADH ST (GAUZE/BANDAGES/DRESSINGS) ×4 IMPLANT
ELECT CAUTERY BLADE TIP 2.5 (TIP) ×2
ELECT REM PT RETURN 9FT ADLT (ELECTROSURGICAL) ×2
ELECTRODE CAUTERY BLDE TIP 2.5 (TIP) ×1 IMPLANT
ELECTRODE REM PT RTRN 9FT ADLT (ELECTROSURGICAL) ×1 IMPLANT
GLOVE SURG SYN 7.5  E (GLOVE) ×1
GLOVE SURG SYN 7.5 E (GLOVE) ×1 IMPLANT
GOWN STRL REUS W/ TWL LRG LVL3 (GOWN DISPOSABLE) ×2 IMPLANT
GOWN STRL REUS W/TWL LRG LVL3 (GOWN DISPOSABLE) ×2
IV NS 500ML (IV SOLUTION) ×1
IV NS 500ML BAXH (IV SOLUTION) ×1 IMPLANT
KIT PORT POWER 8FR ISP CVUE (Port) ×2 IMPLANT
KIT TURNOVER KIT A (KITS) ×2 IMPLANT
LABEL OR SOLS (LABEL) ×2 IMPLANT
MARKER SKIN DUAL TIP RULER LAB (MISCELLANEOUS) ×2 IMPLANT
NEEDLE FILTER BLUNT 18X 1/2SAF (NEEDLE) ×1
NEEDLE FILTER BLUNT 18X1 1/2 (NEEDLE) ×1 IMPLANT
NEEDLE HYPO 22GX1.5 SAFETY (NEEDLE) ×4 IMPLANT
NS IRRIG 500ML POUR BTL (IV SOLUTION) ×2 IMPLANT
PACK PORT-A-CATH (MISCELLANEOUS) ×2 IMPLANT
STRIP CLOSURE SKIN 1/4X4 (GAUZE/BANDAGES/DRESSINGS) IMPLANT
SUT ETHILON 4-0 (SUTURE) ×2
SUT ETHILON 4-0 FS2 18XMFL BLK (SUTURE) ×2
SUT PROLENE 2 0 SH DA (SUTURE) ×4 IMPLANT
SUT VIC AB 2-0 SH 27 (SUTURE) ×1
SUT VIC AB 2-0 SH 27XBRD (SUTURE) ×1 IMPLANT
SUT VIC AB 3-0 SH 27 (SUTURE) ×1
SUT VIC AB 3-0 SH 27X BRD (SUTURE) ×1 IMPLANT
SUTURE ETHLN 4-0 FS2 18XMF BLK (SUTURE) ×2 IMPLANT
SYR 10ML SLIP (SYRINGE) ×2 IMPLANT
SYR 3ML LL SCALE MARK (SYRINGE) ×2 IMPLANT
TAPE CLOTH 3X10 WHT NS LF (GAUZE/BANDAGES/DRESSINGS) IMPLANT
TAPE TRANSPORE STRL 2 31045 (GAUZE/BANDAGES/DRESSINGS) ×2 IMPLANT

## 2019-02-04 NOTE — Anesthesia Preprocedure Evaluation (Signed)
Anesthesia Evaluation  Patient identified by MRN, date of birth, ID band Patient awake    Reviewed: Allergy & Precautions, NPO status , Patient's Chart, lab work & pertinent test results  History of Anesthesia Complications Negative for: history of anesthetic complications  Airway Mallampati: III       Dental  (+) Poor Dentition, Chipped   Pulmonary asthma (mild, inhaler used yesterday) , Current Smoker and Patient abstained from smoking.,           Cardiovascular hypertension, Pt. on medications (-) Past MI and (-) CHF (-) dysrhythmias (-) Valvular Problems/Murmurs     Neuro/Psych neg Seizures Depression    GI/Hepatic Neg liver ROS, neg GERD  ,  Endo/Other  diabetes, Type 2, Oral Hypoglycemic Agents  Renal/GU negative Renal ROS     Musculoskeletal   Abdominal   Peds  Hematology   Anesthesia Other Findings   Reproductive/Obstetrics                            Anesthesia Physical Anesthesia Plan  ASA: III  Anesthesia Plan: General   Post-op Pain Management:    Induction: Intravenous  PONV Risk Score and Plan: 1 and Ondansetron  Airway Management Planned: LMA  Additional Equipment:   Intra-op Plan:   Post-operative Plan:   Informed Consent: I have reviewed the patients History and Physical, chart, labs and discussed the procedure including the risks, benefits and alternatives for the proposed anesthesia with the patient or authorized representative who has indicated his/her understanding and acceptance.       Plan Discussed with:   Anesthesia Plan Comments:         Anesthesia Quick Evaluation

## 2019-02-04 NOTE — Progress Notes (Signed)
Pt transported to registration  desk with wife accompanying him for MRI , pt in wheelchair, by NT

## 2019-02-04 NOTE — Op Note (Signed)
  02/04/2019  9:52 AM  PATIENT:  Kevin Mckay  68 y.o. male  PRE-OPERATIVE DIAGNOSIS: Small cell carcinoma of the lung  POST-OPERATIVE DIAGNOSIS: Small cell carcinoma of the lung  PROCEDURE: Insertion of right subclavian Port-A-Cath using ultrasound and fluoroscopy  SURGEON:  Surgeon(s) and Role:    [name], MD - Primary  ASSISTANTS: None  ANESTHESIA: General  INDICATIONS FOR PROCEDURE SMALL cell carcinoma of the lung with the need for intravenous chemotherapy  DICTATION: This patient is a 68 year old gentleman who was recently diagnosed with small cell carcinoma of the lung.  He has been extensively evaluated and found to be a suitable candidate for chemotherapy.  Indications and risks of Port-A-Cath placement were explained to the patient gives informed consent.  The patient was brought to the operating suite and placed in the supine position.  Laryngeal mask airway was established.  The patient was then prepped and draped in usual sterile fashion.  An ultrasound of the right internal jugular vein was performed.  The external jugular vein was overlying it and I attempted to move the external jugular vein posteriorly with a needle to enter the internal jugular vein but could not do so.  Therefore I turned my attention to the right subclavian vein.  The right subclavian vein was then percutaneously catheterized.  A wire was introduced onto the right side of the heart using fluoroscopic guidance.  A port site was created on the anterior chest wall and the catheter was tunneled from the port site up to the insertion site.  The catheter was then inserted through a peel-away sheath and it was positioned at the appropriate level at the junction of the right atrium and superior vena cava.  It was then trimmed and the port was assembled.  The port was then flushed.  It was then secured to the chest wall with 3 interrupted Prolene sutures.  Fluoroscopy again confirmed the presence of the  catheter in good position without evidence of pneumothorax.  The wounds were then closed.  3-0 Vicryl was used to approximate the subcutaneous tissues and 4-0 nylon was used on the skin.  Sterile dressings were applied.  The patient was then transported to the recovery room in stable condition.   [name], MD

## 2019-02-04 NOTE — Progress Notes (Signed)
Pt to go to MRI at 31, spoke with Colletta [place] who knows he has had anesthesia and will wheel him to wife when MRI is over

## 2019-02-04 NOTE — Transfer of Care (Signed)
Immediate Anesthesia Transfer of Care Note  Patient: Kevin Mckay  Procedure(s) Performed: INSERTION PORT-A-CATH (Right )  Patient Location: PACU  Anesthesia Type:General  Level of Consciousness: awake and patient cooperative  Airway & Oxygen Therapy: Patient Spontanous Breathing and Patient connected to face mask oxygen  Post-op Assessment: Report given to RN and Post -op Vital signs reviewed and stable  Post vital signs: Reviewed and stable  Last Vitals:  Vitals Value Taken Time  BP 142/66 02/04/19 0946  Temp 36.7 C 02/04/19 0946  Pulse 73 02/04/19 0947  Resp 16 02/04/19 0947  SpO2 100 % 02/04/19 0947  Vitals shown include unvalidated device data.  Last Pain:  Vitals:   02/04/19 0614  TempSrc: Oral  PainSc: 0-No pain         Complications: No apparent anesthesia complications

## 2019-02-04 NOTE — Anesthesia Postprocedure Evaluation (Signed)
Anesthesia Post Note  Patient: Kevin Mckay  Procedure(s) Performed: INSERTION PORT-A-CATH (Right )  Patient location during evaluation: PACU Anesthesia Type: General Level of consciousness: awake and alert Pain management: pain level controlled Vital Signs Assessment: post-procedure vital signs reviewed and stable Respiratory status: spontaneous breathing and respiratory function stable Cardiovascular status: stable Anesthetic complications: no     Last Vitals:  Vitals:   02/04/19 0614 02/04/19 0946  BP: 139/69 (!) 142/66  Pulse: 65 73  Resp: 16 13  Temp: 36.7 C 36.7 C  SpO2: 98% 100%    Last Pain:  Vitals:   02/04/19 0614  TempSrc: Oral  PainSc: 0-No pain                 KEPHART,WILLIAM K

## 2019-02-04 NOTE — Interval H&P Note (Signed)
History and Physical Interval Note:  02/04/2019 7:22 AM  Kevin Mckay  has presented today for surgery, with the diagnosis of LUNG CANCER.  The various methods of treatment have been discussed with the patient and family. After consideration of risks, benefits and other options for treatment, the patient has consented to  Procedure(s): INSERTION PORT-A-CATH (N/A) as a surgical intervention.  The patient's history has been reviewed, patient examined, no change in status, stable for surgery.  I have reviewed the patient's chart and labs.  Questions were answered to the patient's satisfaction.     [name]

## 2019-02-04 NOTE — Discharge Instructions (Signed)

## 2019-02-04 NOTE — Anesthesia Post-op Follow-up Note (Signed)
Anesthesia QCDR form completed.        

## 2019-02-04 NOTE — Anesthesia Procedure Notes (Signed)
Procedure Name: LMA Insertion Date/Time: 02/04/2019 8:24 AM Performed by: Rudean Hitt, CRNA Pre-anesthesia Checklist: Patient identified, Patient being monitored, Timeout performed, Emergency Drugs available and Suction available Patient Re-evaluated:Patient Re-evaluated prior to induction Oxygen Delivery Method: Circle system utilized Preoxygenation: Pre-oxygenation with 100% oxygen Induction Type: IV induction Ventilation: Mask ventilation without difficulty LMA: LMA inserted LMA Size: 3.5 Tube type: Oral Number of attempts: 1 Placement Confirmation: positive ETCO2 and breath sounds checked- equal and bilateral Tube secured with: Tape Dental Injury: Teeth and Oropharynx as per pre-operative assessment

## 2019-02-05 ENCOUNTER — Other Ambulatory Visit: Payer: Self-pay

## 2019-02-05 ENCOUNTER — Other Ambulatory Visit: Payer: BC Managed Care – PPO

## 2019-02-05 NOTE — Progress Notes (Signed)
Tumor Board Documentation  Kevin Mckay was presented by Dr Rogue Bussing at our Tumor Board on 02/05/2019, which included representatives from medical oncology, surgical, radiology, navigation, internal medicine, pharmacy, research, palliative care.  Kevin Mckay currently presents as a current patient, for discussion, for new positive pathology with history of the following treatments: active survellience.  Additionally, we reviewed previous medical and familial history, history of present illness, and recent lab results along with all available histopathologic and imaging studies. The tumor board considered available treatment options and made the following recommendations: Concurrent chemo-radiation therapy    The following procedures/referrals were also placed: No orders of the defined types were placed in this encounter.   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  Tumor board is a meeting of clinicians from various specialty areas who evaluate and discuss patients for whom a multidisciplinary approach is being considered. Final determinations in the plan of care are those of the provider(s). The responsibility for follow up of recommendations given during tumor board is that of the provider.   Today's extended care, comprehensive team conference, Kevin Mckay was not present for the discussion and was not examined.   Multidisciplinary Tumor Board is a multidisciplinary case peer review process.  Decisions discussed in the Multidisciplinary Tumor Board reflect the opinions of the specialists present at the conference without having examined the patient.  Ultimately, treatment and diagnostic decisions rest with the primary provider(s) and the  patient.

## 2019-02-06 ENCOUNTER — Ambulatory Visit
Admission: RE | Admit: 2019-02-06 | Discharge: 2019-02-06 | Disposition: A | Discharge status: Home / Self Care | Payer: BC Managed Care – PPO | Source: Ambulatory Visit | Attending: Radiation Oncology | Admitting: Radiation Oncology

## 2019-02-06 ENCOUNTER — Other Ambulatory Visit: Payer: Self-pay

## 2019-02-06 DIAGNOSIS — C801 Malignant (primary) neoplasm, unspecified: Secondary | ICD-10-CM | POA: Diagnosis not present

## 2019-02-06 DIAGNOSIS — Z510 Encounter for antineoplastic radiation therapy: Secondary | ICD-10-CM | POA: Diagnosis not present

## 2019-02-06 DIAGNOSIS — C781 Secondary malignant neoplasm of mediastinum: Secondary | ICD-10-CM | POA: Insufficient documentation

## 2019-02-09 ENCOUNTER — Inpatient Hospital Stay (HOSPITAL_BASED_OUTPATIENT_CLINIC_OR_DEPARTMENT_OTHER): Payer: BC Managed Care – PPO | Admitting: Internal Medicine

## 2019-02-09 ENCOUNTER — Other Ambulatory Visit: Payer: Self-pay

## 2019-02-09 ENCOUNTER — Ambulatory Visit: Payer: BC Managed Care – PPO

## 2019-02-09 ENCOUNTER — Encounter: Payer: Self-pay | Admitting: *Deleted

## 2019-02-09 ENCOUNTER — Inpatient Hospital Stay: Payer: BC Managed Care – PPO

## 2019-02-09 ENCOUNTER — Encounter: Payer: Self-pay | Admitting: Internal Medicine

## 2019-02-09 VITALS — BP 150/72 | HR 83 | Resp 19

## 2019-02-09 DIAGNOSIS — C781 Secondary malignant neoplasm of mediastinum: Secondary | ICD-10-CM

## 2019-02-09 DIAGNOSIS — C801 Malignant (primary) neoplasm, unspecified: Secondary | ICD-10-CM | POA: Diagnosis not present

## 2019-02-09 DIAGNOSIS — C3490 Malignant neoplasm of unspecified part of unspecified bronchus or lung: Secondary | ICD-10-CM | POA: Diagnosis not present

## 2019-02-09 LAB — CBC WITH DIFFERENTIAL/PLATELET
Abs Immature Granulocytes: 0.03 10*3/uL (ref 0.00–0.07)
Basophils Absolute: 0.1 10*3/uL (ref 0.0–0.1)
Basophils Relative: 1 %
Eosinophils Absolute: 0.3 10*3/uL (ref 0.0–0.5)
Eosinophils Relative: 4 %
HCT: 29.9 % — ABNORMAL LOW (ref 39.0–52.0)
Hemoglobin: 9.2 g/dL — ABNORMAL LOW (ref 13.0–17.0)
Immature Granulocytes: 0 %
Lymphocytes Relative: 19 %
Lymphs Abs: 1.6 10*3/uL (ref 0.7–4.0)
MCH: 23.5 pg — ABNORMAL LOW (ref 26.0–34.0)
MCHC: 30.8 g/dL (ref 30.0–36.0)
MCV: 76.3 fL — ABNORMAL LOW (ref 80.0–100.0)
Monocytes Absolute: 0.7 10*3/uL (ref 0.1–1.0)
Monocytes Relative: 8 %
Neutro Abs: 5.7 10*3/uL (ref 1.7–7.7)
Neutrophils Relative %: 68 %
Platelets: 223 10*3/uL (ref 150–400)
RBC: 3.92 MIL/uL — ABNORMAL LOW (ref 4.22–5.81)
RDW: 17.9 % — ABNORMAL HIGH (ref 11.5–15.5)
WBC: 8.3 10*3/uL (ref 4.0–10.5)
nRBC: 0 % (ref 0.0–0.2)

## 2019-02-09 LAB — COMPREHENSIVE METABOLIC PANEL
ALT: 14 U/L (ref 0–44)
AST: 14 U/L — ABNORMAL LOW (ref 15–41)
Albumin: 3.8 g/dL (ref 3.5–5.0)
Alkaline Phosphatase: 67 U/L (ref 38–126)
Anion gap: 10 (ref 5–15)
BUN: 16 mg/dL (ref 8–23)
CO2: 27 mmol/L (ref 22–32)
Calcium: 9 mg/dL (ref 8.9–10.3)
Chloride: 96 mmol/L — ABNORMAL LOW (ref 98–111)
Creatinine, Ser: 1.17 mg/dL (ref 0.61–1.24)
GFR calc Af Amer: >60 mL/min (ref >60)
GFR calc non Af Amer: >60 mL/min (ref >60)
Glucose, Bld: 220 mg/dL — ABNORMAL HIGH (ref 70–99)
Potassium: 3.8 mmol/L (ref 3.5–5.1)
Sodium: 133 mmol/L — ABNORMAL LOW (ref 135–145)
Total Bilirubin: 0.6 mg/dL (ref 0.3–1.2)
Total Protein: 7 g/dL (ref 6.5–8.1)

## 2019-02-09 LAB — LACTATE DEHYDROGENASE: LDH: 100 U/L (ref 98–192)

## 2019-02-09 MED ORDER — SODIUM CHLORIDE 0.9 % IV SOLN
414.0000 mg | Freq: Once | INTRAVENOUS | Status: AC
  Administered 2019-02-09: 410 mg via INTRAVENOUS
  Filled 2019-02-09: qty 41

## 2019-02-09 MED ORDER — SODIUM CHLORIDE 0.9 % IV SOLN
Freq: Once | INTRAVENOUS | Status: AC
  Administered 2019-02-09: 10:00:00 via INTRAVENOUS
  Filled 2019-02-09: qty 250

## 2019-02-09 MED ORDER — HEPARIN SOD (PORK) LOCK FLUSH 100 UNIT/ML IV SOLN
500.0000 [IU] | Freq: Once | INTRAVENOUS | Status: AC
  Administered 2019-02-09: 500 [IU] via INTRAVENOUS
  Filled 2019-02-09: qty 5

## 2019-02-09 MED ORDER — SODIUM CHLORIDE 0.9 % IV SOLN
100.0000 mg/m2 | Freq: Once | INTRAVENOUS | Status: AC
  Administered 2019-02-09: 180 mg via INTRAVENOUS
  Filled 2019-02-09: qty 9

## 2019-02-09 MED ORDER — DEXAMETHASONE SODIUM PHOSPHATE 10 MG/ML IJ SOLN
6.0000 mg | Freq: Once | INTRAMUSCULAR | Status: AC
  Administered 2019-02-09: 6 mg via INTRAVENOUS
  Filled 2019-02-09: qty 1

## 2019-02-09 MED ORDER — PALONOSETRON HCL INJECTION 0.25 MG/5ML
0.2500 mg | Freq: Once | INTRAVENOUS | Status: AC
  Administered 2019-02-09: 0.25 mg via INTRAVENOUS
  Filled 2019-02-09: qty 5

## 2019-02-09 MED ORDER — SODIUM CHLORIDE 0.9% FLUSH
10.0000 mL | Freq: Once | INTRAVENOUS | Status: AC
  Administered 2019-02-09: 10 mL via INTRAVENOUS
  Filled 2019-02-09: qty 10

## 2019-02-09 NOTE — Assessment & Plan Note (Addendum)
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 Proceed with [place] etoposide-every 3 weeks with concurrent radiation [finish? Oct 1st] .  MRI brain negative.  # Carbo-Etop cycle #1 today; plan start RT [8/19]. Again reviewed the potential side effects. Discussed non-availability of clinical trial at [facility]; pt declines going to [place].   # Anemia-hemoglobin 9/iron studies-ferritin 22 saturation 5% suggestive of iron deficiency.  Etiology is unclear- no colonoscopy.  Plan start [facility]  # DM- risk of elevated blood sugars was discussed with the patient with the steroids.  We will decrease the dose of steroids to avoid significant elevation of blood sugars.  Recommend close monitoring of blood sugars/if above 300 to call us/PCP.  # DISPOSITION: # Add Ferrahem infusion on 8/18 or 8/19 # follow up in 3 weeks- MD/labs- cbc/cmp/LDH- carbo-etop [days-1-2-3]  # d-2 [place] Bouchard- Dr.B

## 2019-02-09 NOTE — Progress Notes (Signed)
[place] NOTE  Patient Care Team: Laneta Simmers, NP as PCP - General (Nurse Practitioner) Telford Nab, RN as Registered Nurse  CHIEF COMPLAINTS/PURPOSE OF CONSULTATION: Lung cancer   Oncology History Overview Note  # July 2020- SMALL CELL CA METASTATIC TO MEDIASTINAL LN [open Bx; Dr.Oaks]; TxN2M0; July 2nd 2020-PET- 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  MRI brain negative  # aug 17th 2020-carbo etoposide-RT [? 8/19]  #August 2020 iron deficient anemia-question etiology; IV Feraheme [no colonoscopy]  # COPD/  DM-2- on OHA/ smoker/ PVD/peripheral neuropathy  # History of alcohol abuse/quit 2014/ Hx of abdominal trauma [at 21y]  # DIAGNOSIS: SMALL CELL CA [? 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       HISTORY OF PRESENTING ILLNESS:  Kevin Mckay 68 y.o.  male limited stage small cell-likely lung primary is here for follow-up/proceed with chemotherapy.  Interim patient had a port placed.  He continues to have chronic shortness of breath chronic fatigue.  Not any worse.  Denies any blood in stools or black or stools.  Chronic mild neuropathy attributable to his  diabetes.  Review of Systems  Constitutional: Positive for malaise/fatigue. Negative for chills, diaphoresis, fever and weight loss.  HENT: Negative for nosebleeds and sore throat.   Eyes: Negative for double vision.  Respiratory: Positive for cough, sputum production and shortness of breath. Negative for hemoptysis and wheezing.   Cardiovascular: Negative for chest pain, palpitations, orthopnea and leg swelling.  Gastrointestinal: Negative for abdominal pain, blood in stool, constipation, diarrhea, heartburn, melena, nausea and vomiting.  Genitourinary: Negative for dysuria, frequency and urgency.  Musculoskeletal: Negative for back pain and joint pain.  Skin: Negative.  Negative for itching and rash.  Neurological: Positive for tingling and headaches. Negative for dizziness, focal weakness and weakness.  Endo/Heme/Allergies: Does not bruise/bleed easily.  Psychiatric/Behavioral: Negative for depression. The patient is not nervous/anxious and does not have insomnia.      MEDICAL HISTORY:  Past Medical History:  Diagnosis Date  . Asthma   . Cerebral aneurysm   . Chronic painful diabetic neuropathy ([place])   . Depression   . Diabetes mellitus without complication ([place])   . Essential hypertension   . History of kidney stones   . Occasional tremors   . Tobacco use     SURGICAL HISTORY: Past Surgical History:  Procedure Laterality Date  . ABDOMINAL SURGERY     age 61. trauma surgery due to forklift injury- liver and spleen  . IR GENERIC HISTORICAL  05/31/2016   IR ANGIO VERTEBRAL SEL VERTEBRAL BILAT MOD SED 05/31/2016 Consuella Lose, MD [facility]-INTER[facility] RAD  . IR GENERIC HISTORICAL  05/31/2016   IR ANGIO INTRA EXTRACRAN  SEL INTERNAL CAROTID BILAT MOD SED 05/31/2016 Consuella Lose, MD [facility]INTER[facility]  . PORTACATH PLACEMENT Right 02/04/2019   Procedure: INSERTION PORT-A-CATH;  Surgeon: [name], MD;  Location: [facility] ORS;  Service: General;  Laterality: Right;  . THORACOTOMY Left  01/22/2019   Procedure: PRE OP BRONCH LEFT ANTERIOR THORACOTOMY WITH BIOPSY OF MEDIASTINAL MASS;  Surgeon: [name], MD;  Location: [facility] ORS;  Service: General;  Laterality: Left;    SOCIAL HISTORY: Social History   Socioeconomic History  . Marital status: Married    Spouse name: Not on file  . Number of children: Not on file  . Years of education: Not on file  . Highest education level: Not on file  Occupational History  . Not on file  Social Needs  . Financial resource strain: Not on file  . Food insecurity    Worry: Not on file    Inability: Not on file  . Transportation needs    Medical: Not on file    Non-medical: Not on file  Tobacco Use  . Smoking status: Current Every Day Smoker    Packs/day: 0.50    Years: 50.00    Pack years: 25.00    Types: Cigarettes  . Smokeless tobacco: Never Used  Substance and Sexual Activity  . Alcohol use: No  . Drug use: Yes    Types: Barbituates, Marijuana  . Sexual activity: Not on file  Lifestyle  . Physical activity    Days per week: Not on file    Minutes per session: Not on file  . Stress: Not on file  Relationships  . Social Herbalist on phone: Not on file    Gets together: Not on file    Attends religious service: Not on file    Active member of club or organization: Not on file    Attends meetings of clubs or organizations: Not on file    Relationship status: Not on file  . Intimate partner violence    Fear of current or ex partner: Not on file    Emotionally abused: Not on file    Physically abused: Not on file    Forced sexual activity: Not on file  Other Topics Concern  . Not on file  Social History Narrative   Lives in [place]; wife/ son/grandson [custody]; smoker; vending business; hx of alcoholism- quit at 41.     FAMILY HISTORY: Family History  Problem Relation Age of Onset  . Lymphoma Father   . Liver cancer Paternal Uncle   . Lung cancer Paternal Uncle   . Lung cancer Maternal  Uncle     ALLERGIES:  is allergic to amoxicillin and codeine.  MEDICATIONS:  Current Outpatient Medications  Medication Sig Dispense Refill  . amLODipine (NORVASC) 10 MG tablet Take 10 mg by mouth daily.    . Budesonide-Formoterol Fumarate (SYMBICORT IN) Inhale into the lungs 2 (two) times daily.     . DULoxetine (CYMBALTA) 30 MG capsule Take 30 mg by mouth daily.    Marland Kitchen gabapentin (NEURONTIN) 600 MG tablet Take 600 mg by mouth every 6 (six) hours as needed (pain. Not to exceed 3600 mg).     Marland Kitchen glipiZIDE (GLUCOTROL XL) 10 MG 24 hr tablet Take 10 mg by mouth daily with breakfast.    . hydrochlorothiazide (HYDRODIURIL) 25 MG tablet Take 1 tablet by mouth daily.    Marland Kitchen lidocaine-prilocaine (EMLA) cream Apply 1 application topically as needed. 30-45 mins prior to port access. 30 g 0  .  lovastatin (MEVACOR) 20 MG tablet Take 20 mg by mouth every evening.    . metFORMIN (GLUCOPHAGE) 1000 MG tablet Take 1,000 mg by mouth 2 (two) times daily with a meal.    . ondansetron (ZOFRAN) 8 MG tablet Take 1 tablet (8 mg total) by mouth every 8 (eight) hours as needed for nausea or vomiting (start 3 days; after chemo). 40 tablet 1  . prazosin (MINIPRESS) 1 MG capsule Take 1 mg by mouth at bedtime.    . prochlorperazine (COMPAZINE) 10 MG tablet Take 1 tablet (10 mg total) by mouth every 6 (six) hours as needed for nausea or vomiting. 40 tablet 1  . sitaGLIPtin (JANUVIA) 25 MG tablet Take 25 mg by mouth daily.    . traMADol (ULTRAM) 50 MG tablet Take 2 tablets (100 mg total) by mouth every 6 (six) hours. 30 tablet 0  . oxyCODONE-acetaminophen (PERCOCET/ROXICET) 5-325 MG tablet Take 1-2 tablets by mouth every 4 (four) hours as needed for moderate pain. (Patient not taking: Reported on 02/09/2019) 30 tablet 0   No current facility-administered medications for this visit.    Facility-Administered Medications Ordered in Other Visits  Medication Dose Route Frequency Provider Last Rate Last Dose  . heparin lock flush  100 unit/mL  500 Units Intravenous Once Cammie Sickle, MD          .  PHYSICAL EXAMINATION: ECOG PERFORMANCE STATUS: 0 - Asymptomatic  Vitals:   02/09/19 0858  BP: (!) 148/75  Pulse: 67  Resp: 16  Temp: (!) 96.5 F (35.8 C)   Filed Weights   02/09/19 0858  Weight: 141 lb (64 kg)    Physical Exam  Constitutional: He is oriented to person, place, and time and well-developed, well-nourished, and in no distress.  HENT:  Head: Normocephalic and atraumatic.  Mouth/Throat: Oropharynx is clear and moist. No oropharyngeal exudate.  Eyes: Pupils are equal, round, and reactive to light.  Neck: Normal range of motion. Neck supple.  Cardiovascular: Normal rate and regular rhythm.  Pulmonary/Chest: No respiratory distress. He has no wheezes.  Decreased air entry bilaterally.  Abdominal: Soft. Bowel sounds are normal. He exhibits no distension and no mass. There is no abdominal tenderness. There is no rebound and no guarding.  Musculoskeletal: Normal range of motion.        General: No tenderness or edema.  Neurological: He is alert and oriented to person, place, and time.  Skin: Skin is warm.  Psychiatric: Affect normal.    LABORATORY DATA:  I have reviewed the data as listed Lab Results  Component Value Date   WBC 8.3 02/09/2019   HGB 9.2 (L) 02/09/2019   HCT 29.9 (L) 02/09/2019   MCV 76.3 (L) 02/09/2019   PLT 223 02/09/2019   Recent Labs    01/09/19 1539 01/22/19 1433 01/29/19 0927 02/09/19 0840  NA 138 136 136 133*  K 3.8 3.5 4.6 3.8  CL 99 104 101 96*  CO2 26 25 25 27   GLUCOSE 160* 279* 186* 220*  BUN 14 12 21 16   CREATININE 1.21 1.05 1.11 1.17  CALCIUM 9.5 8.4* 9.1 9.0  GFRNONAA >60 >60 >60 >60  GFRAA >60 >60 >60 >60  PROT 7.2  --   --  7.0  ALBUMIN 3.9  --   --  3.8  AST 17  --   --  14*  ALT 12  --   --  14  ALKPHOS 64  --   --  67  BILITOT 0.4  --   --  0.6    RADIOGRAPHIC STUDIES: I have personally reviewed the radiological images as  listed and agreed with the findings in the report. Dg Chest 2 View  Result Date: 01/23/2019 CLINICAL DATA:  Postop check; EXAM: CHEST - 2 VIEW COMPARISON:  Radiograph 01/23/2019 at 7 56 hours FINDINGS: Normal cardiac silhouette. LEFT-sided chest tube in place without pneumothorax. Lung bases are clear. Band of atelectasis in the RIGHT midlung. No interval change. IMPRESSION: 1. No interval change. 2. LEFT chest tube in place without pneumothorax. 3. RIGHT midlung atelectasis. Electronically Signed   By: Suzy Bouchard M.D.   On: 01/23/2019 14:24   Dg Chest 2 View  Result Date: 01/16/2019 CLINICAL DATA:  Preoperative radiograph. EXAM: CHEST - 2 VIEW COMPARISON:  September 04, 2018 FINDINGS: Cardiomediastinal silhouette is normal. Mediastinal contours appear intact. Calcific atherosclerotic disease of the aorta. There is no evidence of focal airspace consolidation, pleural effusion or pneumothorax. Osseous structures are without acute abnormality. Soft tissues are grossly normal. IMPRESSION: No active cardiopulmonary disease. Electronically Signed   By: Fidela Salisbury M.D.   On: 01/16/2019 11:52   Mr Jeri Cos MH Contrast  Result Date: 02/04/2019 CLINICAL DATA:  Newly diagnosed small cell lung cancer. EXAM: MRI HEAD WITHOUT AND WITH CONTRAST TECHNIQUE: Multiplanar, multiecho pulse sequences of the brain and surrounding structures were obtained without and with intravenous contrast. CONTRAST:  6 mL Gadavist COMPARISON:  Head CT 03/19/2016 and MRI 06/06/2015 FINDINGS: Brain: There is no evidence of acute infarct, intracranial hemorrhage, mass, midline shift, or extra-axial fluid collection. The ventricles and sulci are within normal limits for age. No significant cerebral white matter disease is seen. No abnormal enhancement is identified. Vascular: Major intracranial vascular flow voids are preserved. Known left MCA trifurcation aneurysm is not well evaluated on this non angiographic study. Skull and upper  cervical spine: Unremarkable bone marrow signal. Sinuses/Orbits: Unremarkable orbits. Right larger than left maxillary sinus mucous retention cysts. Mild bilateral ethmoid and left frontal sinus mucosal thickening. Chronic moderate-sized right mastoid effusion. Other: None. IMPRESSION: Unremarkable appearance of the brain. No evidence of intracranial metastases. Electronically Signed   By: Logan Bores M.D.   On: 02/04/2019 17:33   Dg Chest Port 1 View  Result Date: 02/04/2019 CLINICAL DATA:  Postop mediastinal mass. EXAM: PORTABLE CHEST 1 VIEW COMPARISON:  January 23, 2019 FINDINGS: A right central venous line is identified distal tip in superior vena cava. Bulbous contour at the aortic knob is identified unchanged. The heart size is normal. The lungs are clear. There is no pneumothorax. The bony structures are stable. IMPRESSION: Right central venous line as described.  There is no pneumothorax. Bulbous contour of the aortic knob is identified unchanged compared prior exam. Lungs are clear. Electronically Signed   By: Abelardo Diesel M.D.   On: 02/04/2019 10:14   Dg Chest Port 1 View  Result Date: 01/23/2019 CLINICAL DATA:  Postop check EXAM: PORTABLE CHEST 1 VIEW COMPARISON:  01/22/2019, 01/16/2019 FINDINGS: There is a left-sided chest tube in satisfactory position. There is no pneumothorax. There is right lower lung discoid atelectasis. There is no pleural effusion or pneumothorax. There is left perihilar airspace disease likely reflecting atelectasis. Persistent left AP window mass again noted. There is no acute osseous abnormality. IMPRESSION: 1. Left-sided chest tube in satisfactory position.  No pneumothorax. Electronically Signed   By: Kathreen Devoid   On: 01/23/2019 09:02   Dg Chest Port 1 View  Result Date: 01/22/2019 CLINICAL DATA:  Patient status post left anterior thoracotomy  and biopsy of the mediastinal mass today. EXAM: PORTABLE CHEST 1 VIEW COMPARISON:  PA and lateral chest 01/16/2019.  CT  chest 11/25/2018. FINDINGS: Drain in the left pleural space is now in place. Lungs are clear. No pneumothorax or pleural fluid. Heart size is normal. Known mediastinal mass is better seen on prior CT scan. No acute or focal bony abnormality. IMPRESSION: No acute disease. Negative for pneumothorax with a pleural drain on the left in place. Electronically Signed   By: Inge Rise M.D.   On: 01/22/2019 13:32   Dg C-arm 1-60 Min-no Report  Result Date: 02/04/2019 Fluoroscopy was utilized by the requesting physician.  No radiographic interpretation.    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  TX N2 M0. Proceed with [place] etoposide-every 3 weeks with concurrent radiation [finish? Oct 1st] .  MRI brain negative.  # Carbo-Etop cycle #1 today; plan start RT [8/19]. Again reviewed the potential side effects. Discussed non-availability of clinical trial at [facility]; pt declines going to [place].   # Anemia-hemoglobin 9/iron studies-ferritin 22 saturation 5% suggestive of iron deficiency.  Etiology is unclear- no colonoscopy.  Plan start [facility]  # DM- risk of elevated blood sugars was discussed with the patient with the steroids.  We will decrease the dose of steroids to avoid significant elevation of blood sugars.  Recommend close monitoring of blood sugars/if above 300 to call us/PCP.  # DISPOSITION: # Add Ferrahem infusion on 8/18 or 8/19 # follow up in 3 weeks- MD/labs- cbc/cmp/LDH- carbo-etop [days-1-2-3]  # d-2 [place] Bouchard- Dr.B  All questions were answered. The patient knows to call the clinic with any problems, questions or concerns.    Cammie Sickle, MD 02/09/2019 9:30 AM

## 2019-02-09 NOTE — Progress Notes (Signed)
Pt tolerated Etopside and Carboplatin infusion well today with no signs of reaction or complications.VS stable prior to infusion and prior to discharge. Pt educated that if complications arises at home to call the clinic or call 911 if it is an emergency. Pt verbalizes understanding and all questions were answered at this time.   Kevin Mckay [employer]

## 2019-02-09 NOTE — Progress Notes (Signed)
  Oncology Nurse Navigator Documentation  Navigator Location: [facility]-Med Onc (02/09/19 1500)   )Navigator Encounter Type: Treatment (02/09/19 1500)                   Treatment Initiated Date: 02/09/19 (02/09/19 1500) Patient Visit Type: MedOnc (02/09/19 1500) Treatment Phase: First Chemo Tx (02/09/19 1500) Barriers/Navigation Needs: No Barriers At This Time (02/09/19 1500)   Interventions: None Required (02/09/19 1500)                      Time Spent with Patient: 15 (02/09/19 1500)

## 2019-02-09 NOTE — Progress Notes (Signed)
Patient here to initiate chemotherapy.  He is out of the Percocet that Dr. Genevive Bi prescribed and is asking if Dr. Jacinto Reap will refill.

## 2019-02-09 NOTE — Progress Notes (Signed)
New chemotherapy start hand off provided to Moody Bruins RN by Renita Papa, RN

## 2019-02-10 ENCOUNTER — Telehealth: Payer: Self-pay

## 2019-02-10 ENCOUNTER — Other Ambulatory Visit: Payer: Self-pay

## 2019-02-10 ENCOUNTER — Inpatient Hospital Stay: Payer: BC Managed Care – PPO

## 2019-02-10 VITALS — BP 138/80 | HR 74 | Temp 96.6°F | Resp 18

## 2019-02-10 DIAGNOSIS — C3490 Malignant neoplasm of unspecified part of unspecified bronchus or lung: Secondary | ICD-10-CM | POA: Diagnosis not present

## 2019-02-10 DIAGNOSIS — C781 Secondary malignant neoplasm of mediastinum: Secondary | ICD-10-CM

## 2019-02-10 MED ORDER — HEPARIN SOD (PORK) LOCK FLUSH 100 UNIT/ML IV SOLN
500.0000 [IU] | Freq: Once | INTRAVENOUS | Status: AC | PRN
  Administered 2019-02-10: 500 [IU]
  Filled 2019-02-10: qty 5

## 2019-02-10 MED ORDER — DEXAMETHASONE SODIUM PHOSPHATE 10 MG/ML IJ SOLN
4.0000 mg | Freq: Once | INTRAMUSCULAR | Status: AC
  Administered 2019-02-10: 4 mg via INTRAVENOUS
  Filled 2019-02-10: qty 1

## 2019-02-10 MED ORDER — SODIUM CHLORIDE 0.9 % IV SOLN
100.0000 mg/m2 | Freq: Once | INTRAVENOUS | Status: AC
  Administered 2019-02-10: 180 mg via INTRAVENOUS
  Filled 2019-02-10: qty 9

## 2019-02-10 MED ORDER — SODIUM CHLORIDE 0.9 % IV SOLN
Freq: Once | INTRAVENOUS | Status: AC
  Administered 2019-02-10: 09:00:00 via INTRAVENOUS
  Filled 2019-02-10: qty 250

## 2019-02-10 NOTE — Telephone Encounter (Signed)
Telephone call to patient for follow up after first chemotherapy treatment which was received yesterday.  Patient states "feeling no different" and the treatment went well.  Denies any nausea or vomiting.  Has no complaints.  Encouraged patient to call for any questions or concerns.

## 2019-02-11 ENCOUNTER — Inpatient Hospital Stay: Payer: BC Managed Care – PPO

## 2019-02-11 ENCOUNTER — Other Ambulatory Visit: Payer: Self-pay

## 2019-02-11 ENCOUNTER — Ambulatory Visit
Admission: RE | Admit: 2019-02-11 | Discharge: 2019-02-11 | Disposition: A | Discharge status: Home / Self Care | Payer: BC Managed Care – PPO | Source: Ambulatory Visit | Attending: Radiation Oncology | Admitting: Radiation Oncology

## 2019-02-11 ENCOUNTER — Encounter: Payer: Self-pay | Admitting: Nurse Practitioner

## 2019-02-11 VITALS — BP 142/58 | HR 71 | Resp 18

## 2019-02-11 DIAGNOSIS — E611 Iron deficiency: Secondary | ICD-10-CM

## 2019-02-11 DIAGNOSIS — C781 Secondary malignant neoplasm of mediastinum: Secondary | ICD-10-CM

## 2019-02-11 DIAGNOSIS — C801 Malignant (primary) neoplasm, unspecified: Secondary | ICD-10-CM

## 2019-02-11 DIAGNOSIS — C3490 Malignant neoplasm of unspecified part of unspecified bronchus or lung: Secondary | ICD-10-CM | POA: Diagnosis not present

## 2019-02-11 MED ORDER — SODIUM CHLORIDE 0.9 % IV SOLN
Freq: Once | INTRAVENOUS | Status: AC
  Administered 2019-02-11: 10:00:00 via INTRAVENOUS
  Filled 2019-02-11: qty 250

## 2019-02-11 MED ORDER — SODIUM CHLORIDE 0.9 % IV SOLN
510.0000 mg | Freq: Once | INTRAVENOUS | Status: AC
  Administered 2019-02-11: 510 mg via INTRAVENOUS
  Filled 2019-02-11: qty 17

## 2019-02-11 MED ORDER — SODIUM CHLORIDE 0.9 % IV SOLN
Freq: Once | INTRAVENOUS | Status: AC
  Filled 2019-02-11: qty 250

## 2019-02-11 MED ORDER — SODIUM CHLORIDE 0.9% FLUSH
10.0000 mL | INTRAVENOUS | Status: DC | PRN
  Administered 2019-02-11: 10 mL
  Filled 2019-02-11: qty 10

## 2019-02-11 MED ORDER — DEXAMETHASONE SODIUM PHOSPHATE 10 MG/ML IJ SOLN: 4.0000 mg | Freq: Once | INTRAMUSCULAR | Status: DC

## 2019-02-11 MED ORDER — METHYLPREDNISOLONE SODIUM SUCC 125 MG IJ SOLR
125.0000 mg | Freq: Once | INTRAMUSCULAR | Status: AC | PRN
  Administered 2019-02-11: 125 mg via INTRAVENOUS

## 2019-02-11 MED ORDER — SODIUM CHLORIDE 0.9 % IV SOLN
100.0000 mg/m2 | Freq: Once | INTRAVENOUS | Status: AC
  Administered 2019-02-11: 180 mg via INTRAVENOUS
  Filled 2019-02-11: qty 9

## 2019-02-11 MED ORDER — HEPARIN SOD (PORK) LOCK FLUSH 100 UNIT/ML IV SOLN
500.0000 [IU] | Freq: Once | INTRAVENOUS | Status: AC | PRN
  Administered 2019-02-11: 500 [IU]
  Filled 2019-02-11: qty 5

## 2019-02-11 NOTE — Progress Notes (Signed)
Infusion Reaction Progress Note  Date: 02/11/19  Time: 11:00 AM  Called to infusion for reaction to feraheme. Within 5 minutes of starting infusion, patient began coughing, flushed, and became short of breath. Per nursing oxygen saturation dropped into low 90s. Feraheme was stopped, IV fluids started, and IV steroids were being pushed upon my arrival. Patient was on oxygen via nasal canula, sats in mid-90s, flushing extending from face down chest. No coughing. Patient speaking in full sentences, flushing improved. Sats improved. Vitals otherwise normal. I asked nursing to wean oxygen, give 233ml of fluids, discontinue feraheme. If symptoms return, notify NP/MD. Otherwise, if patient stable, ok to receive etoposide as scheduled. Discussed with Dr. Rogue Bussing who agrees with plan of care and his team will coordinate venofer for future infusion.   Beckey Rutter, DNP, AGNP-C [place] [facility] [phone] (work cell) [phone] (office)

## 2019-02-11 NOTE — Progress Notes (Signed)
Feraheme infusion started at 1046. Within the first 5 minutes from the start of the infusion pt started to have a dry cough, his appearance was flushed,  he stated he felt "short of breath and felt warm." Feraheme infusion was stopped immediately, 125 mg IV Solumedrol was given, 1L of NS was hung and infusing at 1050. MD and NP made aware. Vital signs at 1052- BP-127/48, P-72, O2-90% on room air. [name] NP arrived at bedside. 2L of oxygen was applied. O2 was rechecked at 1055 on 2L-98, oxygen weaned off and stats range from 95-98%. Verbal order for pt to receive 250 mL of NS instead of the Liter. Pt is alert and oriented and talking in complete sentences.   Vital signs rechecked at 1100- BP 140/53, P-66, O2- 97% on room air. Pt states that he "feels better and the shortness of breath is gone." Pt also does not have a flush appearance and no longer has a dry cough at this time.  Vital signs rechecked at 1107- BP-141/60, P-68, O2- 95 on room air. And 250 ml NS ended at this time.  Verbal order to continue with etopside infusion that was started at 1145 and to not give patient decadron 4 mg IV prior to infusion due to pt receiving receiving solumedrol during the reaction of the Feraheme. Pt tolerated etopside infusion well with no signs of reaction.  Pt updated and all questions answered at this time. RN educated pt on the importance of calling the clinic or calling 911 if an emergency arises at home. Pt verbalizes understanding. VSS upon discharge and pt has no complaints and states "he feels normal. "    Kevin Mckay [employer]

## 2019-02-12 ENCOUNTER — Ambulatory Visit: Payer: BC Managed Care – PPO

## 2019-02-12 ENCOUNTER — Other Ambulatory Visit: Payer: Self-pay

## 2019-02-12 ENCOUNTER — Ambulatory Visit
Admission: RE | Admit: 2019-02-12 | Discharge: 2019-02-12 | Disposition: A | Discharge status: Home / Self Care | Payer: BC Managed Care – PPO | Source: Ambulatory Visit | Attending: Radiation Oncology | Admitting: Radiation Oncology

## 2019-02-12 DIAGNOSIS — C781 Secondary malignant neoplasm of mediastinum: Secondary | ICD-10-CM | POA: Diagnosis not present

## 2019-02-13 ENCOUNTER — Ambulatory Visit
Admission: RE | Admit: 2019-02-13 | Discharge: 2019-02-13 | Disposition: A | Discharge status: Home / Self Care | Payer: BC Managed Care – PPO | Source: Ambulatory Visit | Attending: Radiation Oncology | Admitting: Radiation Oncology

## 2019-02-13 ENCOUNTER — Encounter: Payer: Self-pay | Admitting: *Deleted

## 2019-02-13 ENCOUNTER — Other Ambulatory Visit: Payer: Self-pay

## 2019-02-13 DIAGNOSIS — C781 Secondary malignant neoplasm of mediastinum: Secondary | ICD-10-CM | POA: Diagnosis not present

## 2019-02-13 NOTE — Progress Notes (Signed)
  Oncology Nurse Navigator Documentation  Navigator Location: [facility]-Med Onc (02/13/19 1600)   )Navigator Encounter Type: Clinic/MDC (02/13/19 1600)                     Patient Visit Type: Other (02/13/19 1600)       Interventions: Other(suture removal) (02/13/19 1600)         patient came in today to have sutures removed from port placement after radiation treatment. Per Dr. Genevive Bi VO, okay to remove sutures at this time. Sutures removed without difficulty. Incision was clean, dry, and intact after suture removal. Nothing further needed at this time.              Time Spent with Patient: 30 (02/13/19 1600)

## 2019-02-16 ENCOUNTER — Ambulatory Visit
Admission: RE | Admit: 2019-02-16 | Discharge: 2019-02-16 | Disposition: A | Discharge status: Home / Self Care | Payer: BC Managed Care – PPO | Source: Ambulatory Visit | Attending: Radiation Oncology | Admitting: Radiation Oncology

## 2019-02-16 ENCOUNTER — Other Ambulatory Visit: Payer: Self-pay

## 2019-02-16 DIAGNOSIS — C781 Secondary malignant neoplasm of mediastinum: Secondary | ICD-10-CM | POA: Diagnosis not present

## 2019-02-17 ENCOUNTER — Ambulatory Visit
Admission: RE | Admit: 2019-02-17 | Discharge: 2019-02-17 | Disposition: A | Discharge status: Home / Self Care | Payer: BC Managed Care – PPO | Source: Ambulatory Visit | Attending: Radiation Oncology | Admitting: Radiation Oncology

## 2019-02-17 ENCOUNTER — Other Ambulatory Visit: Payer: Self-pay

## 2019-02-17 DIAGNOSIS — C781 Secondary malignant neoplasm of mediastinum: Secondary | ICD-10-CM | POA: Diagnosis not present

## 2019-02-18 ENCOUNTER — Other Ambulatory Visit: Payer: Self-pay

## 2019-02-18 ENCOUNTER — Ambulatory Visit
Admission: RE | Admit: 2019-02-18 | Discharge: 2019-02-18 | Disposition: A | Discharge status: Home / Self Care | Payer: BC Managed Care – PPO | Source: Ambulatory Visit | Attending: Radiation Oncology | Admitting: Radiation Oncology

## 2019-02-18 DIAGNOSIS — C781 Secondary malignant neoplasm of mediastinum: Secondary | ICD-10-CM | POA: Diagnosis not present

## 2019-02-19 ENCOUNTER — Other Ambulatory Visit: Payer: Self-pay

## 2019-02-19 ENCOUNTER — Ambulatory Visit
Admission: RE | Admit: 2019-02-19 | Discharge: 2019-02-19 | Disposition: A | Discharge status: Home / Self Care | Payer: BC Managed Care – PPO | Source: Ambulatory Visit | Attending: Radiation Oncology | Admitting: Radiation Oncology

## 2019-02-19 DIAGNOSIS — C781 Secondary malignant neoplasm of mediastinum: Secondary | ICD-10-CM | POA: Diagnosis not present

## 2019-02-20 ENCOUNTER — Other Ambulatory Visit: Payer: Self-pay

## 2019-02-20 ENCOUNTER — Ambulatory Visit
Admission: RE | Admit: 2019-02-20 | Discharge: 2019-02-20 | Disposition: A | Discharge status: Home / Self Care | Payer: BC Managed Care – PPO | Source: Ambulatory Visit | Attending: Radiation Oncology | Admitting: Radiation Oncology

## 2019-02-20 DIAGNOSIS — C781 Secondary malignant neoplasm of mediastinum: Secondary | ICD-10-CM | POA: Diagnosis not present

## 2019-02-23 ENCOUNTER — Ambulatory Visit
Admission: RE | Admit: 2019-02-23 | Discharge: 2019-02-23 | Disposition: A | Discharge status: Home / Self Care | Payer: BC Managed Care – PPO | Source: Ambulatory Visit | Attending: Radiation Oncology | Admitting: Radiation Oncology

## 2019-02-23 ENCOUNTER — Other Ambulatory Visit: Payer: Self-pay

## 2019-02-23 DIAGNOSIS — C781 Secondary malignant neoplasm of mediastinum: Secondary | ICD-10-CM | POA: Diagnosis not present

## 2019-02-24 ENCOUNTER — Other Ambulatory Visit: Payer: Self-pay | Admitting: *Deleted

## 2019-02-24 ENCOUNTER — Ambulatory Visit
Admission: RE | Admit: 2019-02-24 | Discharge: 2019-02-24 | Disposition: A | Discharge status: Home / Self Care | Payer: BC Managed Care – PPO | Source: Ambulatory Visit | Attending: Radiation Oncology | Admitting: Radiation Oncology

## 2019-02-24 ENCOUNTER — Other Ambulatory Visit: Payer: Self-pay

## 2019-02-24 DIAGNOSIS — Z510 Encounter for antineoplastic radiation therapy: Secondary | ICD-10-CM | POA: Insufficient documentation

## 2019-02-24 DIAGNOSIS — C801 Malignant (primary) neoplasm, unspecified: Secondary | ICD-10-CM | POA: Diagnosis not present

## 2019-02-24 DIAGNOSIS — C781 Secondary malignant neoplasm of mediastinum: Secondary | ICD-10-CM | POA: Insufficient documentation

## 2019-02-24 MED ORDER — SUCRALFATE 1 G PO TABS: 1.0000 g | ORAL_TABLET | Freq: Three times a day (TID) | ORAL | 2 refills | Status: DC

## 2019-02-25 ENCOUNTER — Ambulatory Visit
Admission: RE | Admit: 2019-02-25 | Discharge: 2019-02-25 | Disposition: A | Discharge status: Home / Self Care | Payer: BC Managed Care – PPO | Source: Ambulatory Visit | Attending: Radiation Oncology | Admitting: Radiation Oncology

## 2019-02-25 ENCOUNTER — Other Ambulatory Visit: Payer: Self-pay

## 2019-02-25 ENCOUNTER — Other Ambulatory Visit: Payer: Self-pay | Admitting: *Deleted

## 2019-02-25 DIAGNOSIS — C781 Secondary malignant neoplasm of mediastinum: Secondary | ICD-10-CM | POA: Diagnosis not present

## 2019-02-25 MED ORDER — SUCRALFATE 1 GM/10ML PO SUSP: ORAL | 0 refills | Status: DC

## 2019-02-26 ENCOUNTER — Other Ambulatory Visit: Payer: Self-pay

## 2019-02-26 ENCOUNTER — Ambulatory Visit
Admission: RE | Admit: 2019-02-26 | Discharge: 2019-02-26 | Disposition: A | Discharge status: Home / Self Care | Payer: BC Managed Care – PPO | Source: Ambulatory Visit | Attending: Radiation Oncology | Admitting: Radiation Oncology

## 2019-02-26 DIAGNOSIS — C781 Secondary malignant neoplasm of mediastinum: Secondary | ICD-10-CM | POA: Diagnosis not present

## 2019-02-27 ENCOUNTER — Other Ambulatory Visit: Payer: Self-pay

## 2019-02-27 ENCOUNTER — Ambulatory Visit
Admission: RE | Admit: 2019-02-27 | Discharge: 2019-02-27 | Disposition: A | Discharge status: Home / Self Care | Payer: BC Managed Care – PPO | Source: Ambulatory Visit | Attending: Radiation Oncology | Admitting: Radiation Oncology

## 2019-02-27 ENCOUNTER — Other Ambulatory Visit: Payer: Self-pay | Admitting: *Deleted

## 2019-02-27 ENCOUNTER — Encounter: Payer: Self-pay | Admitting: Internal Medicine

## 2019-02-27 DIAGNOSIS — C781 Secondary malignant neoplasm of mediastinum: Secondary | ICD-10-CM | POA: Diagnosis not present

## 2019-02-27 NOTE — Progress Notes (Signed)
Patient denies any concerns today.  

## 2019-03-03 ENCOUNTER — Inpatient Hospital Stay: Payer: BC Managed Care – PPO

## 2019-03-03 ENCOUNTER — Encounter: Payer: Self-pay | Admitting: Internal Medicine

## 2019-03-03 ENCOUNTER — Ambulatory Visit
Admission: RE | Admit: 2019-03-03 | Discharge: 2019-03-03 | Disposition: A | Discharge status: Home / Self Care | Payer: BC Managed Care – PPO | Source: Ambulatory Visit | Attending: Radiation Oncology | Admitting: Radiation Oncology

## 2019-03-03 ENCOUNTER — Inpatient Hospital Stay: Payer: BC Managed Care – PPO | Attending: Internal Medicine | Admitting: Internal Medicine

## 2019-03-03 ENCOUNTER — Other Ambulatory Visit: Payer: Self-pay

## 2019-03-03 ENCOUNTER — Other Ambulatory Visit: Payer: Self-pay | Admitting: *Deleted

## 2019-03-03 DIAGNOSIS — R109 Unspecified abdominal pain: Secondary | ICD-10-CM | POA: Insufficient documentation

## 2019-03-03 DIAGNOSIS — K921 Melena: Secondary | ICD-10-CM | POA: Insufficient documentation

## 2019-03-03 DIAGNOSIS — D649 Anemia, unspecified: Secondary | ICD-10-CM

## 2019-03-03 DIAGNOSIS — C801 Malignant (primary) neoplasm, unspecified: Secondary | ICD-10-CM | POA: Insufficient documentation

## 2019-03-03 DIAGNOSIS — J449 Chronic obstructive pulmonary disease, unspecified: Secondary | ICD-10-CM | POA: Diagnosis not present

## 2019-03-03 DIAGNOSIS — D509 Iron deficiency anemia, unspecified: Secondary | ICD-10-CM | POA: Insufficient documentation

## 2019-03-03 DIAGNOSIS — Z7984 Long term (current) use of oral hypoglycemic drugs: Secondary | ICD-10-CM | POA: Insufficient documentation

## 2019-03-03 DIAGNOSIS — R450 Nervousness: Secondary | ICD-10-CM | POA: Diagnosis not present

## 2019-03-03 DIAGNOSIS — Z5111 Encounter for antineoplastic chemotherapy: Secondary | ICD-10-CM | POA: Diagnosis present

## 2019-03-03 DIAGNOSIS — F329 Major depressive disorder, single episode, unspecified: Secondary | ICD-10-CM | POA: Diagnosis not present

## 2019-03-03 DIAGNOSIS — R5383 Other fatigue: Secondary | ICD-10-CM | POA: Insufficient documentation

## 2019-03-03 DIAGNOSIS — E119 Type 2 diabetes mellitus without complications: Secondary | ICD-10-CM | POA: Diagnosis not present

## 2019-03-03 DIAGNOSIS — C781 Secondary malignant neoplasm of mediastinum: Secondary | ICD-10-CM

## 2019-03-03 DIAGNOSIS — Z79899 Other long term (current) drug therapy: Secondary | ICD-10-CM | POA: Diagnosis not present

## 2019-03-03 DIAGNOSIS — R1310 Dysphagia, unspecified: Secondary | ICD-10-CM | POA: Insufficient documentation

## 2019-03-03 DIAGNOSIS — I10 Essential (primary) hypertension: Secondary | ICD-10-CM | POA: Diagnosis not present

## 2019-03-03 DIAGNOSIS — I671 Cerebral aneurysm, nonruptured: Secondary | ICD-10-CM | POA: Insufficient documentation

## 2019-03-03 DIAGNOSIS — F17210 Nicotine dependence, cigarettes, uncomplicated: Secondary | ICD-10-CM | POA: Insufficient documentation

## 2019-03-03 DIAGNOSIS — E1142 Type 2 diabetes mellitus with diabetic polyneuropathy: Secondary | ICD-10-CM | POA: Insufficient documentation

## 2019-03-03 LAB — CBC WITH DIFFERENTIAL/PLATELET
Abs Immature Granulocytes: 0.14 10*3/uL — ABNORMAL HIGH (ref 0.00–0.07)
Basophils Absolute: 0 10*3/uL (ref 0.0–0.1)
Basophils Relative: 0 %
Eosinophils Absolute: 0.1 10*3/uL (ref 0.0–0.5)
Eosinophils Relative: 1 %
HCT: 23.8 % — ABNORMAL LOW (ref 39.0–52.0)
Hemoglobin: 7.4 g/dL — ABNORMAL LOW (ref 13.0–17.0)
Immature Granulocytes: 2 %
Lymphocytes Relative: 13 %
Lymphs Abs: 1.3 10*3/uL (ref 0.7–4.0)
MCH: 23.5 pg — ABNORMAL LOW (ref 26.0–34.0)
MCHC: 31.1 g/dL (ref 30.0–36.0)
MCV: 75.6 fL — ABNORMAL LOW (ref 80.0–100.0)
Monocytes Absolute: 1.1 10*3/uL — ABNORMAL HIGH (ref 0.1–1.0)
Monocytes Relative: 12 %
Neutro Abs: 7 10*3/uL (ref 1.7–7.7)
Neutrophils Relative %: 72 %
Platelets: 383 10*3/uL (ref 150–400)
RBC: 3.15 MIL/uL — ABNORMAL LOW (ref 4.22–5.81)
RDW: 18.5 % — ABNORMAL HIGH (ref 11.5–15.5)
WBC: 9.6 10*3/uL (ref 4.0–10.5)
nRBC: 0 % (ref 0.0–0.2)

## 2019-03-03 LAB — SAMPLE TO BLOOD BANK

## 2019-03-03 LAB — COMPREHENSIVE METABOLIC PANEL
ALT: 11 U/L (ref 0–44)
AST: 12 U/L — ABNORMAL LOW (ref 15–41)
Albumin: 3.4 g/dL — ABNORMAL LOW (ref 3.5–5.0)
Alkaline Phosphatase: 68 U/L (ref 38–126)
Anion gap: 10 (ref 5–15)
BUN: 13 mg/dL (ref 8–23)
CO2: 26 mmol/L (ref 22–32)
Calcium: 8.9 mg/dL (ref 8.9–10.3)
Chloride: 99 mmol/L (ref 98–111)
Creatinine, Ser: 0.85 mg/dL (ref 0.61–1.24)
GFR calc Af Amer: >60 mL/min (ref >60)
GFR calc non Af Amer: >60 mL/min (ref >60)
Glucose, Bld: 212 mg/dL — ABNORMAL HIGH (ref 70–99)
Potassium: 3.6 mmol/L (ref 3.5–5.1)
Sodium: 135 mmol/L (ref 135–145)
Total Bilirubin: 0.4 mg/dL (ref 0.3–1.2)
Total Protein: 6.6 g/dL (ref 6.5–8.1)

## 2019-03-03 LAB — PREPARE RBC (CROSSMATCH)

## 2019-03-03 LAB — ABO/RH: ABO/RH(D): A POS

## 2019-03-03 LAB — LACTATE DEHYDROGENASE: LDH: 107 U/L (ref 98–192)

## 2019-03-03 MED ORDER — SODIUM CHLORIDE 0.9 % IV SOLN
410.0000 mg | Freq: Once | INTRAVENOUS | Status: AC
  Administered 2019-03-03: 410 mg via INTRAVENOUS
  Filled 2019-03-03: qty 41

## 2019-03-03 MED ORDER — PALONOSETRON HCL INJECTION 0.25 MG/5ML
0.2500 mg | Freq: Once | INTRAVENOUS | Status: AC
  Administered 2019-03-03: 0.25 mg via INTRAVENOUS
  Filled 2019-03-03: qty 5

## 2019-03-03 MED ORDER — TRAMADOL HCL 50 MG PO TABS: 50.0000 mg | ORAL_TABLET | Freq: Three times a day (TID) | ORAL | 0 refills | Status: DC | PRN

## 2019-03-03 MED ORDER — SODIUM CHLORIDE 0.9 % IV SOLN
Freq: Once | INTRAVENOUS | Status: AC
  Administered 2019-03-03: 10:00:00 via INTRAVENOUS
  Filled 2019-03-03: qty 250

## 2019-03-03 MED ORDER — DEXAMETHASONE SODIUM PHOSPHATE 10 MG/ML IJ SOLN
6.0000 mg | Freq: Once | INTRAMUSCULAR | Status: AC
  Administered 2019-03-03: 6 mg via INTRAVENOUS
  Filled 2019-03-03: qty 1

## 2019-03-03 MED ORDER — SODIUM CHLORIDE 0.9 % IV SOLN
100.0000 mg/m2 | Freq: Once | INTRAVENOUS | Status: AC
  Administered 2019-03-03: 180 mg via INTRAVENOUS
  Filled 2019-03-03: qty 9

## 2019-03-03 MED ORDER — HEPARIN SOD (PORK) LOCK FLUSH 100 UNIT/ML IV SOLN
500.0000 [IU] | Freq: Once | INTRAVENOUS | Status: AC
  Administered 2019-03-03: 500 [IU] via INTRAVENOUS
  Filled 2019-03-03: qty 5

## 2019-03-03 MED ORDER — SODIUM CHLORIDE 0.9% FLUSH
10.0000 mL | Freq: Once | INTRAVENOUS | Status: AC
  Administered 2019-03-03: 10 mL via INTRAVENOUS
  Filled 2019-03-03: qty 10

## 2019-03-03 NOTE — Assessment & Plan Note (Addendum)
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 on [place] etoposide-every 3 weeks with concurrent radiation [finish? Oct 1st] . start RT [8/19].   # Carbo-Etop cycle #2 today. Labs today reviewed;  acceptable for treatment today except Hb 7.4 [see below]  # Anemia-hemoglobin 7.4/ iron deficiency- chemo. S/p IV Ferrahem- "reaction". On PO iron; if not working then try venofer.   # pain sec to swallowing-second radiation.  Recommend tramadol; prilosec/ tums; cannot afford Carafate.  Will check with social work.  # DM-blood glucose this morning 212.  Recommend close monitoring.  # DISPOSITION: # proceed with today- carbo-Etop  Days 1-2-3 # cancel ferrahem on 9/9.  # Hold tube/PRBC transfusion 1 units 9/9 or 9/10.  # follow up in 3 weeks- MD/labs- cbc/cmp/HOLD tube-carbo-etop [days-1-2-3]  # d-2 - possible venofer-  Dr.B

## 2019-03-03 NOTE — Progress Notes (Signed)
[facility] NOTE  Patient Care Team: Laneta Simmers, NP as PCP - General (Nurse Practitioner) Telford Nab, RN as Registered Nurse  CHIEF COMPLAINTS/PURPOSE OF CONSULTATION: Lung cancer   Oncology History Overview Note  # July 2020- SMALL CELL CA METASTATIC TO MEDIASTINAL LN [open Bx; Dr.Oaks]; TxN2M0; July 2nd 2020-PET- 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  MRI brain negative  # aug 17th 2020-carbo etoposide-RT [? 8/19]  #August 2020 iron deficient anemia-question etiology; IV Feraheme [no colonoscopy]  # COPD/  DM-2- on OHA/ smoker/ PVD/peripheral neuropathy  # History of alcohol abuse/quit 2014/ Hx of abdominal trauma [at 21y]  # DIAGNOSIS: SMALL CELL CA [? 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 [facility])   02/09/2019 -  Chemotherapy   The patient had palonosetron (ALOXI) injection 0.25 mg, 0.25 mg, Intravenous,  Once, 2 of 4 cycles Administration: 0.25 mg (02/09/2019) CARBOplatin (PARAPLATIN) 410 mg in sodium chloride 0.9 % 250 mL chemo infusion, 410 mg (100 % of original dose 414 mg), Intravenous,  Once, 2 of 4 cycles Dose modification:   (original dose 414 mg, Cycle 1) Administration: 410 mg (02/09/2019) etoposide (VEPESID) 180 mg in sodium chloride 0.9 % 500 mL chemo infusion, 100 mg/m2 = 180 mg, Intravenous,  Once, 2 of 4 cycles Administration: 180 mg (02/09/2019), 180 mg (02/10/2019), 180 mg (02/11/2019)  for chemotherapy treatment.       HISTORY OF PRESENTING ILLNESS:  Ann Gimpel 68 y.o.  male limited stage small cell-likely lung primary is currently on concurrent chemoradiation carboplatin etoposide is here for follow-up.  Patient complains of worsening fatigue.   Denies any blood in stools or black or stools.  Continues to complain of shortness of breath with exertion.  Chronic tingling numbness in extremities because of diabetes.  Not any worse.  Patient is currently taking iron pills once a day.  Patient had infusion reaction to Feraheme.   Patient complains of mild to moderate difficulty swallowing pain with swallowing.  He was unable to pick up his prescription for Carafate.  Review of Systems  Constitutional: Positive for malaise/fatigue. Negative for chills, diaphoresis, fever and weight loss.  HENT: Negative for nosebleeds and sore throat.   Eyes: Negative for double vision.  Respiratory: Positive for cough, sputum production and shortness of breath. Negative for hemoptysis and wheezing.   Cardiovascular: Negative for chest pain, palpitations, orthopnea and leg swelling.  Gastrointestinal: Negative for abdominal pain, blood in stool, constipation, diarrhea, heartburn, melena, nausea and vomiting.  Genitourinary: Negative for dysuria, frequency and urgency.  Musculoskeletal: Negative for back pain and joint pain.  Skin: Negative.  Negative for itching and rash.  Neurological: Positive for tingling and headaches. Negative for dizziness, focal weakness and weakness.  Endo/Heme/Allergies: Does not bruise/bleed easily.  Psychiatric/Behavioral: Negative for depression. The patient is not nervous/anxious and does not have insomnia.      MEDICAL HISTORY:  Past Medical History:  Diagnosis Date  . Asthma   . Cerebral aneurysm   . Chronic painful diabetic neuropathy ([place])   . Depression   . Diabetes mellitus without complication ([place])   . Essential hypertension   . History of kidney stones   . Occasional tremors   . Tobacco use     SURGICAL  HISTORY: Past Surgical History:  Procedure Laterality Date  . ABDOMINAL SURGERY     age 44. trauma surgery due to forklift injury- liver and spleen  . IR GENERIC HISTORICAL  05/31/2016   IR ANGIO  VERTEBRAL SEL VERTEBRAL BILAT MOD SED 05/31/2016 Consuella Lose, MD [facility]-INTER[facility] RAD  . IR GENERIC HISTORICAL  05/31/2016   IR ANGIO INTRA EXTRACRAN SEL INTERNAL CAROTID BILAT MOD SED 05/31/2016 Consuella Lose, MD [facility]  . PORTACATH PLACEMENT Right 02/04/2019   Procedure: INSERTION PORT-A-CATH;  Surgeon: [name], MD;  Location: [facility] ORS;  Service: General;  Laterality: Right;  . THORACOTOMY Left 01/22/2019   Procedure: PRE OP BRONCH LEFT ANTERIOR THORACOTOMY WITH BIOPSY OF MEDIASTINAL MASS;  Surgeon: [name], MD;  Location: [facility] ORS;  Service: General;  Laterality: Left;    SOCIAL HISTORY: Social History   Socioeconomic History  . Marital status: Married    Spouse name: Not on file  . Number of children: Not on file  . Years of education: Not on file  . Highest education level: Not on file  Occupational History  . Not on file  Social Needs  . Financial resource strain: Not on file  . Food insecurity    Worry: Not on file    Inability: Not on file  . Transportation needs    Medical: Not on file    Non-medical: Not on file  Tobacco Use  . Smoking status: Current Every Day Smoker    Packs/day: 0.50    Years: 50.00    Pack years: 25.00    Types: Cigarettes  . Smokeless tobacco: Never Used  Substance and Sexual Activity  . Alcohol use: No  . Drug use: Yes    Types: Barbituates, Marijuana  . Sexual activity: Not on file  Lifestyle  . Physical activity    Days per week: Not on file    Minutes per session: Not on file  . Stress: Not on file  Relationships  . Social Herbalist on phone: Not on file    Gets together: Not on file    Attends religious service: Not on file    Active member of club or organization: Not on file    Attends meetings of clubs or organizations: Not on file    Relationship status: Not on file  . Intimate partner violence    Fear of current or ex partner: Not on file    Emotionally abused: Not on file    Physically  abused: Not on file    Forced sexual activity: Not on file  Other Topics Concern  . Not on file  Social History Narrative   Lives in [place]; wife/ son/grandson [custody]; smoker; vending business; hx of alcoholism- quit at 62.     FAMILY HISTORY: Family History  Problem Relation Age of Onset  . Lymphoma Father   . Liver cancer Paternal Uncle   . Lung cancer Paternal Uncle   . Lung cancer Maternal Uncle     ALLERGIES:  is allergic to amoxicillin and codeine.  MEDICATIONS:  Current Outpatient Medications  Medication Sig Dispense Refill  . amLODipine (NORVASC) 10 MG tablet Take 10 mg by mouth daily.    . Budesonide-Formoterol Fumarate (SYMBICORT IN) Inhale into the lungs 2 (two) times daily.     . DULoxetine (CYMBALTA) 30 MG capsule Take 30 mg by mouth daily.    Marland Kitchen gabapentin (NEURONTIN) 600 MG tablet Take 600 mg by mouth every 6 (six) hours as needed (pain.  Not to exceed 3600 mg).     Marland Kitchen glipiZIDE (GLUCOTROL XL) 10 MG 24 hr tablet Take 10 mg by mouth daily with breakfast.    . hydrochlorothiazide (HYDRODIURIL) 25 MG tablet Take 1 tablet by mouth daily.    Marland Kitchen lidocaine-prilocaine (EMLA) cream Apply 1 application topically as needed. 30-45 mins prior to port access. 30 g 0  . lovastatin (MEVACOR) 20 MG tablet Take 20 mg by mouth every evening.    . metFORMIN (GLUCOPHAGE) 1000 MG tablet Take 1,000 mg by mouth 2 (two) times daily with a meal.    . ondansetron (ZOFRAN) 8 MG tablet Take 1 tablet (8 mg total) by mouth every 8 (eight) hours as needed for nausea or vomiting (start 3 days; after chemo). 40 tablet 1  . prazosin (MINIPRESS) 1 MG capsule Take 1 mg by mouth at bedtime.    . prochlorperazine (COMPAZINE) 10 MG tablet Take 1 tablet (10 mg total) by mouth every 6 (six) hours as needed for nausea or vomiting. 40 tablet 1  . sitaGLIPtin (JANUVIA) 25 MG tablet Take 25 mg by mouth daily.    . sucralfate (CARAFATE) 1 GM/10ML suspension Take 58ml three times daily  30 minutes before meals  420 mL 0  . traMADol (ULTRAM) 50 MG tablet Take 1 tablet (50 mg total) by mouth every 8 (eight) hours as needed. 60 tablet 0  . oxyCODONE-acetaminophen (PERCOCET/ROXICET) 5-325 MG tablet Take 1-2 tablets by mouth every 4 (four) hours as needed for moderate pain. (Patient not taking: Reported on 02/09/2019) 30 tablet 0   No current facility-administered medications for this visit.       Marland Kitchen  PHYSICAL EXAMINATION: ECOG PERFORMANCE STATUS: 0 - Asymptomatic  Vitals:   03/03/19 0900  BP: 134/61  Pulse: 76  Temp: (!) 97.2 F (36.2 C)   Filed Weights   03/03/19 0900  Weight: 145 lb 3.2 oz (65.9 kg)    Physical Exam  Constitutional: He is oriented to person, place, and time and well-developed, well-nourished, and in no distress.  HENT:  Head: Normocephalic and atraumatic.  Mouth/Throat: Oropharynx is clear and moist. No oropharyngeal exudate.  Eyes: Pupils are equal, round, and reactive to light.  Neck: Normal range of motion. Neck supple.  Cardiovascular: Normal rate and regular rhythm.  Pulmonary/Chest: No respiratory distress. He has no wheezes.  Decreased air entry bilaterally.  Abdominal: Soft. Bowel sounds are normal. He exhibits no distension and no mass. There is no abdominal tenderness. There is no rebound and no guarding.  Musculoskeletal: Normal range of motion.        General: No tenderness or edema.  Neurological: He is alert and oriented to person, place, and time.  Skin: Skin is warm.  Psychiatric: Affect normal.    LABORATORY DATA:  I have reviewed the data as listed Lab Results  Component Value Date   WBC 9.6 03/03/2019   HGB 7.4 (L) 03/03/2019   HCT 23.8 (L) 03/03/2019   MCV 75.6 (L) 03/03/2019   PLT 383 03/03/2019   Recent Labs    01/09/19 1539  01/29/19 0927 02/09/19 0840 03/03/19 0830  NA 138   < > 136 133* 135  K 3.8   < > 4.6 3.8 3.6  CL 99   < > 101 96* 99  CO2 26   < > 25 27 26   GLUCOSE 160*   < > 186* 220* 212*  BUN 14   < > 21 16 13    CREATININE 1.21   < >  1.11 1.17 0.85  CALCIUM 9.5   < > 9.1 9.0 8.9  GFRNONAA >60   < > >60 >60 >60  GFRAA >60   < > >60 >60 >60  PROT 7.2  --   --  7.0 6.6  ALBUMIN 3.9  --   --  3.8 3.4*  AST 17  --   --  14* 12*  ALT 12  --   --  14 11  ALKPHOS 64  --   --  67 68  BILITOT 0.4  --   --  0.6 0.4   < > = values in this interval not displayed.    RADIOGRAPHIC STUDIES: I have personally reviewed the radiological images as listed and agreed with the findings in the report. Mr Jeri Cos Wo Contrast  Result Date: 02/04/2019 CLINICAL DATA:  Newly diagnosed small cell lung cancer. EXAM: MRI HEAD WITHOUT AND WITH CONTRAST TECHNIQUE: Multiplanar, multiecho pulse sequences of the brain and surrounding structures were obtained without and with intravenous contrast. CONTRAST:  6 mL Gadavist COMPARISON:  Head CT 03/19/2016 and MRI 06/06/2015 FINDINGS: Brain: There is no evidence of acute infarct, intracranial hemorrhage, mass, midline shift, or extra-axial fluid collection. The ventricles and sulci are within normal limits for age. No significant cerebral white matter disease is seen. No abnormal enhancement is identified. Vascular: Major intracranial vascular flow voids are preserved. Known left MCA trifurcation aneurysm is not well evaluated on this non angiographic study. Skull and upper cervical spine: Unremarkable bone marrow signal. Sinuses/Orbits: Unremarkable orbits. Right larger than left maxillary sinus mucous retention cysts. Mild bilateral ethmoid and left frontal sinus mucosal thickening. Chronic moderate-sized right mastoid effusion. Other: None. IMPRESSION: Unremarkable appearance of the brain. No evidence of intracranial metastases. Electronically Signed   By: Logan Bores M.D.   On: 02/04/2019 17:33   Dg Chest Port 1 View  Result Date: 02/04/2019 CLINICAL DATA:  Postop mediastinal mass. EXAM: PORTABLE CHEST 1 VIEW COMPARISON:  January 23, 2019 FINDINGS: A right central venous line is identified  distal tip in superior vena cava. Bulbous contour at the aortic knob is identified unchanged. The heart size is normal. The lungs are clear. There is no pneumothorax. The bony structures are stable. IMPRESSION: Right central venous line as described.  There is no pneumothorax. Bulbous contour of the aortic knob is identified unchanged compared prior exam. Lungs are clear. Electronically Signed   By: Abelardo Diesel M.D.   On: 02/04/2019 10:14   Dg C-arm 1-60 Min-no Report  Result Date: 02/04/2019 Fluoroscopy was utilized by the requesting physician.  No radiographic interpretation.    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 on [place] etoposide-every 3 weeks with concurrent radiation [finish? Oct 1st] . start RT [8/19].   # Carbo-Etop cycle #2 today. Labs today reviewed;  acceptable for treatment today except Hb 7.4 [see below]  # Anemia-hemoglobin 7.4/ iron deficiency- chemo. S/p IV Ferrahem- "reaction". On PO iron; if not working then try venofer.   # pain sec to swallowing-second radiation.  Recommend tramadol; prilosec/ tums; cannot afford Carafate.  Will check with social work.  # DM-blood glucose this morning 212.  Recommend close monitoring.  # DISPOSITION: # proceed with today- carbo-Etop  Days 1-2-3 # cancel ferrahem on 9/9.  # Hold tube/PRBC transfusion 1 units 9/9 or 9/10.  # follow up in 3 weeks- MD/labs- cbc/cmp/HOLD tube-carbo-etop [days-1-2-3]  #  d-2 - possible venofer-  Dr.B  All questions were answered. The patient knows to call the clinic with any problems, questions or concerns.    Cammie Sickle, MD 03/03/2019 1:57 PM

## 2019-03-03 NOTE — Progress Notes (Signed)
Spoke with Dr. Jacinto Reap, he would like to keep patient's Carboplatin dose at 410 mg since experiencing anemia.  Larene Beach, PharmD

## 2019-03-04 ENCOUNTER — Ambulatory Visit
Admission: RE | Admit: 2019-03-04 | Discharge: 2019-03-04 | Disposition: A | Discharge status: Home / Self Care | Payer: BC Managed Care – PPO | Source: Ambulatory Visit | Attending: Radiation Oncology | Admitting: Radiation Oncology

## 2019-03-04 ENCOUNTER — Other Ambulatory Visit: Payer: Self-pay

## 2019-03-04 ENCOUNTER — Inpatient Hospital Stay: Payer: BC Managed Care – PPO

## 2019-03-04 ENCOUNTER — Other Ambulatory Visit: Payer: Self-pay | Admitting: *Deleted

## 2019-03-04 ENCOUNTER — Telehealth: Payer: Self-pay | Admitting: *Deleted

## 2019-03-04 VITALS — BP 118/55 | HR 64 | Temp 96.0°F | Resp 17

## 2019-03-04 DIAGNOSIS — R1310 Dysphagia, unspecified: Secondary | ICD-10-CM

## 2019-03-04 DIAGNOSIS — C781 Secondary malignant neoplasm of mediastinum: Secondary | ICD-10-CM

## 2019-03-04 DIAGNOSIS — Z5111 Encounter for antineoplastic chemotherapy: Secondary | ICD-10-CM | POA: Diagnosis not present

## 2019-03-04 DIAGNOSIS — D649 Anemia, unspecified: Secondary | ICD-10-CM

## 2019-03-04 MED ORDER — DIPHENHYDRAMINE HCL 25 MG PO CAPS
ORAL_CAPSULE | ORAL | Status: AC
  Filled 2019-03-04: qty 1

## 2019-03-04 MED ORDER — SUCRALFATE 1 GM/10ML PO SUSP: ORAL | 3 refills | Status: DC

## 2019-03-04 MED ORDER — DEXAMETHASONE SODIUM PHOSPHATE 10 MG/ML IJ SOLN
4.0000 mg | Freq: Once | INTRAMUSCULAR | Status: AC
  Administered 2019-03-04: 4 mg via INTRAVENOUS
  Filled 2019-03-04: qty 1

## 2019-03-04 MED ORDER — ACETAMINOPHEN 325 MG PO TABS
650.0000 mg | ORAL_TABLET | Freq: Once | ORAL | Status: AC
  Administered 2019-03-04: 650 mg via ORAL

## 2019-03-04 MED ORDER — DIPHENHYDRAMINE HCL 25 MG PO CAPS
25.0000 mg | ORAL_CAPSULE | Freq: Once | ORAL | Status: AC
  Administered 2019-03-04: 25 mg via ORAL

## 2019-03-04 MED ORDER — SODIUM CHLORIDE 0.9 % IV SOLN
100.0000 mg/m2 | Freq: Once | INTRAVENOUS | Status: AC
  Administered 2019-03-04: 180 mg via INTRAVENOUS
  Filled 2019-03-04: qty 9

## 2019-03-04 MED ORDER — HEPARIN SOD (PORK) LOCK FLUSH 100 UNIT/ML IV SOLN
500.0000 [IU] | Freq: Once | INTRAVENOUS | Status: DC | PRN
  Filled 2019-03-04: qty 5

## 2019-03-04 MED ORDER — SODIUM CHLORIDE 0.9 % IV SOLN
Freq: Once | INTRAVENOUS | Status: AC
  Administered 2019-03-04: 09:00:00 via INTRAVENOUS
  Filled 2019-03-04: qty 250

## 2019-03-04 MED ORDER — SODIUM CHLORIDE 0.9% IV SOLUTION
250.0000 mL | Freq: Once | INTRAVENOUS | Status: AC
  Administered 2019-03-04: 250 mL via INTRAVENOUS
  Filled 2019-03-04: qty 250

## 2019-03-04 MED ORDER — ACETAMINOPHEN 325 MG PO TABS
ORAL_TABLET | ORAL | Status: AC
  Filled 2019-03-04: qty 2

## 2019-03-04 NOTE — Progress Notes (Signed)
Pt tolerated blood transfusion and etoposide infusion well/ Pt stable at discharge.

## 2019-03-04 NOTE — Telephone Encounter (Signed)
Left vm x 2- requesting call back to confirm which pharmacy pt prefers to send the carafate script.

## 2019-03-04 NOTE — Telephone Encounter (Signed)
-----   Message from Gillis Santa sent at 03/03/2019  2:19 PM EDT ----- Regarding: RE: needs financial assistance Call it in to [facility], [place] or [facility].  Let patient pick drug store of choice.  Thanks Elease Etienne ----- Message ----- From: Sabino Gasser, RN Sent: 03/03/2019   2:07 PM EDT To: Sabino Gasser, RN, Gillis Santa, # Subject: needs financial assistance                     Patient needs financial assistance for carafate. How can we help?

## 2019-03-04 NOTE — Telephone Encounter (Signed)
Spoke with patient, while he was at his [facility]. [facility].

## 2019-03-04 NOTE — Addendum Note (Signed)
Addended by: Sabino Gasser on: 03/04/2019 09:29 AM   Modules accepted: Orders

## 2019-03-05 ENCOUNTER — Inpatient Hospital Stay: Payer: BC Managed Care – PPO

## 2019-03-05 ENCOUNTER — Other Ambulatory Visit: Payer: Self-pay

## 2019-03-05 ENCOUNTER — Ambulatory Visit
Admission: RE | Admit: 2019-03-05 | Discharge: 2019-03-05 | Disposition: A | Discharge status: Home / Self Care | Payer: BC Managed Care – PPO | Source: Ambulatory Visit | Attending: Radiation Oncology | Admitting: Radiation Oncology

## 2019-03-05 VITALS — BP 146/62 | HR 76 | Temp 97.1°F | Resp 19

## 2019-03-05 DIAGNOSIS — C801 Malignant (primary) neoplasm, unspecified: Secondary | ICD-10-CM

## 2019-03-05 DIAGNOSIS — C781 Secondary malignant neoplasm of mediastinum: Secondary | ICD-10-CM | POA: Diagnosis not present

## 2019-03-05 DIAGNOSIS — Z5111 Encounter for antineoplastic chemotherapy: Secondary | ICD-10-CM | POA: Diagnosis not present

## 2019-03-05 LAB — TYPE AND SCREEN
ABO/RH(D): A POS
Antibody Screen: NEGATIVE
Unit division: 0

## 2019-03-05 LAB — BPAM RBC
Blood Product Expiration Date: 202009232359
ISSUE DATE / TIME: 202009091013
Unit Type and Rh: 6200

## 2019-03-05 MED ORDER — SODIUM CHLORIDE 0.9 % IV SOLN
Freq: Once | INTRAVENOUS | Status: AC
  Administered 2019-03-05: 09:00:00 via INTRAVENOUS
  Filled 2019-03-05: qty 250

## 2019-03-05 MED ORDER — HEPARIN SOD (PORK) LOCK FLUSH 100 UNIT/ML IV SOLN
500.0000 [IU] | Freq: Once | INTRAVENOUS | Status: AC | PRN
  Administered 2019-03-05: 500 [IU]
  Filled 2019-03-05: qty 5

## 2019-03-05 MED ORDER — DEXAMETHASONE SODIUM PHOSPHATE 10 MG/ML IJ SOLN
4.0000 mg | Freq: Once | INTRAMUSCULAR | Status: AC
  Administered 2019-03-05: 4 mg via INTRAVENOUS
  Filled 2019-03-05: qty 1

## 2019-03-05 MED ORDER — SODIUM CHLORIDE 0.9 % IV SOLN
100.0000 mg/m2 | Freq: Once | INTRAVENOUS | Status: AC
  Administered 2019-03-05: 180 mg via INTRAVENOUS
  Filled 2019-03-05: qty 9

## 2019-03-06 ENCOUNTER — Other Ambulatory Visit: Payer: Self-pay

## 2019-03-06 ENCOUNTER — Ambulatory Visit
Admission: RE | Admit: 2019-03-06 | Discharge: 2019-03-06 | Disposition: A | Discharge status: Home / Self Care | Payer: BC Managed Care – PPO | Source: Ambulatory Visit | Attending: Radiation Oncology | Admitting: Radiation Oncology

## 2019-03-06 DIAGNOSIS — C781 Secondary malignant neoplasm of mediastinum: Secondary | ICD-10-CM | POA: Diagnosis not present

## 2019-03-09 ENCOUNTER — Other Ambulatory Visit: Payer: Self-pay

## 2019-03-09 ENCOUNTER — Ambulatory Visit
Admission: RE | Admit: 2019-03-09 | Discharge: 2019-03-09 | Disposition: A | Discharge status: Home / Self Care | Payer: BC Managed Care – PPO | Source: Ambulatory Visit | Attending: Radiation Oncology | Admitting: Radiation Oncology

## 2019-03-09 DIAGNOSIS — C781 Secondary malignant neoplasm of mediastinum: Secondary | ICD-10-CM | POA: Diagnosis not present

## 2019-03-10 ENCOUNTER — Other Ambulatory Visit: Payer: Self-pay

## 2019-03-10 ENCOUNTER — Ambulatory Visit
Admission: RE | Admit: 2019-03-10 | Discharge: 2019-03-10 | Disposition: A | Discharge status: Home / Self Care | Payer: BC Managed Care – PPO | Source: Ambulatory Visit | Attending: Radiation Oncology | Admitting: Radiation Oncology

## 2019-03-10 DIAGNOSIS — C781 Secondary malignant neoplasm of mediastinum: Secondary | ICD-10-CM | POA: Diagnosis not present

## 2019-03-11 ENCOUNTER — Other Ambulatory Visit: Payer: Self-pay

## 2019-03-11 ENCOUNTER — Ambulatory Visit
Admission: RE | Admit: 2019-03-11 | Discharge: 2019-03-11 | Disposition: A | Discharge status: Home / Self Care | Payer: BC Managed Care – PPO | Source: Ambulatory Visit | Attending: Radiation Oncology | Admitting: Radiation Oncology

## 2019-03-11 ENCOUNTER — Telehealth: Payer: Self-pay | Admitting: *Deleted

## 2019-03-11 DIAGNOSIS — K921 Melena: Secondary | ICD-10-CM

## 2019-03-11 DIAGNOSIS — Z5189 Encounter for other specified aftercare: Secondary | ICD-10-CM

## 2019-03-11 DIAGNOSIS — C781 Secondary malignant neoplasm of mediastinum: Secondary | ICD-10-CM | POA: Diagnosis not present

## 2019-03-11 NOTE — Telephone Encounter (Signed)
Dr. B Does pt need an apt in [facility]?

## 2019-03-11 NOTE — Telephone Encounter (Signed)
Pt needs to be seen in [facility] tomorrow- 9/17. Please schedule. GB

## 2019-03-11 NOTE — Telephone Encounter (Signed)
PCP called to report that she saw patient today who reports that he is having blacks stools of and on for past 2 weeks. She is asking if she needs to do any other testing on patient. Please return her call [phone]

## 2019-03-12 ENCOUNTER — Other Ambulatory Visit: Payer: Self-pay

## 2019-03-12 ENCOUNTER — Ambulatory Visit
Admission: RE | Admit: 2019-03-12 | Discharge: 2019-03-12 | Disposition: A | Discharge status: Home / Self Care | Payer: BC Managed Care – PPO | Source: Ambulatory Visit | Attending: Radiation Oncology | Admitting: Radiation Oncology

## 2019-03-12 ENCOUNTER — Inpatient Hospital Stay: Payer: BC Managed Care – PPO

## 2019-03-12 ENCOUNTER — Inpatient Hospital Stay (HOSPITAL_BASED_OUTPATIENT_CLINIC_OR_DEPARTMENT_OTHER): Payer: BC Managed Care – PPO | Admitting: Oncology

## 2019-03-12 ENCOUNTER — Encounter: Payer: Self-pay | Admitting: Oncology

## 2019-03-12 VITALS — BP 149/67 | HR 73 | Temp 98.1°F | Resp 16 | Wt 142.6 lb

## 2019-03-12 DIAGNOSIS — C781 Secondary malignant neoplasm of mediastinum: Secondary | ICD-10-CM | POA: Diagnosis not present

## 2019-03-12 DIAGNOSIS — D649 Anemia, unspecified: Secondary | ICD-10-CM | POA: Diagnosis not present

## 2019-03-12 DIAGNOSIS — C801 Malignant (primary) neoplasm, unspecified: Secondary | ICD-10-CM

## 2019-03-12 DIAGNOSIS — K921 Melena: Secondary | ICD-10-CM

## 2019-03-12 DIAGNOSIS — Z5189 Encounter for other specified aftercare: Secondary | ICD-10-CM

## 2019-03-12 DIAGNOSIS — Z5111 Encounter for antineoplastic chemotherapy: Secondary | ICD-10-CM | POA: Diagnosis not present

## 2019-03-12 LAB — CBC WITH DIFFERENTIAL/PLATELET
Abs Immature Granulocytes: 0.03 10*3/uL (ref 0.00–0.07)
Basophils Absolute: 0.1 10*3/uL (ref 0.0–0.1)
Basophils Relative: 1 %
Eosinophils Absolute: 0 10*3/uL (ref 0.0–0.5)
Eosinophils Relative: 1 %
HCT: 26.4 % — ABNORMAL LOW (ref 39.0–52.0)
Hemoglobin: 8.2 g/dL — ABNORMAL LOW (ref 13.0–17.0)
Immature Granulocytes: 1 %
Lymphocytes Relative: 22 %
Lymphs Abs: 0.9 10*3/uL (ref 0.7–4.0)
MCH: 24.2 pg — ABNORMAL LOW (ref 26.0–34.0)
MCHC: 31.1 g/dL (ref 30.0–36.0)
MCV: 77.9 fL — ABNORMAL LOW (ref 80.0–100.0)
Monocytes Absolute: 0.1 10*3/uL (ref 0.1–1.0)
Monocytes Relative: 3 %
Neutro Abs: 3.1 10*3/uL (ref 1.7–7.7)
Neutrophils Relative %: 72 %
Platelets: 216 10*3/uL (ref 150–400)
RBC: 3.39 MIL/uL — ABNORMAL LOW (ref 4.22–5.81)
RDW: 18.5 % — ABNORMAL HIGH (ref 11.5–15.5)
WBC: 4.3 10*3/uL (ref 4.0–10.5)
nRBC: 0 % (ref 0.0–0.2)

## 2019-03-12 LAB — COMPREHENSIVE METABOLIC PANEL
ALT: 18 U/L (ref 0–44)
AST: 15 U/L (ref 15–41)
Albumin: 3.6 g/dL (ref 3.5–5.0)
Alkaline Phosphatase: 57 U/L (ref 38–126)
Anion gap: 10 (ref 5–15)
BUN: 16 mg/dL (ref 8–23)
CO2: 27 mmol/L (ref 22–32)
Calcium: 8.9 mg/dL (ref 8.9–10.3)
Chloride: 99 mmol/L (ref 98–111)
Creatinine, Ser: 0.92 mg/dL (ref 0.61–1.24)
GFR calc Af Amer: >60 mL/min (ref >60)
GFR calc non Af Amer: >60 mL/min (ref >60)
Glucose, Bld: 280 mg/dL — ABNORMAL HIGH (ref 70–99)
Potassium: 3.9 mmol/L (ref 3.5–5.1)
Sodium: 136 mmol/L (ref 135–145)
Total Bilirubin: 0.3 mg/dL (ref 0.3–1.2)
Total Protein: 6.7 g/dL (ref 6.5–8.1)

## 2019-03-12 LAB — SAMPLE TO BLOOD BANK

## 2019-03-12 NOTE — Telephone Encounter (Signed)
Patient accepts appointment for 930 lab/ [facility] radiation therapy Deirdre notified that he may be a few minutes late for his appointment with them

## 2019-03-12 NOTE — Progress Notes (Signed)
Symptom Management Consult note [facility]  [phone] [phone]  Patient Care Team: Laneta Simmers, NP as PCP - General (Nurse Practitioner) Telford Nab, RN as Registered Nurse   Name of the patient: Kevin Mckay  [id number]  08/16/1950   Date of visit: 03/12/2019   Diagnosis-small cell lung cancer  Chief complaint/ Reason for visit-black stools  Heme/Onc history:  Oncology History Overview Note  # July 2020- SMALL CELL CA METASTATIC TO MEDIASTINAL LN [open Bx; Dr.Oaks]; [id number]; July 2nd 2020-PET- 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  MRI brain negative  # aug 17th 2020-carbo etoposide-RT [? 8/19]  #August 2020 iron deficient anemia-question etiology; IV Feraheme [no colonoscopy]  # COPD/  DM-2- on OHA/ smoker/ PVD/peripheral neuropathy  # History of alcohol abuse/quit 2014/ Hx of abdominal trauma [at 21y]  # DIAGNOSIS: SMALL CELL CA [? 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 [facility])   02/09/2019 -  Chemotherapy   The patient had palonosetron (ALOXI) injection 0.25 mg, 0.25 mg, Intravenous,  Once, 2 of 4 cycles Administration: 0.25 mg (02/09/2019), 0.25 mg (03/03/2019) CARBOplatin (PARAPLATIN) 410 mg in sodium chloride 0.9 % 250 mL chemo infusion, 410 mg (100 % of original dose 414 mg), Intravenous,  Once, 2 of 4 cycles Dose modification:   (original dose 414 mg, Cycle 1) Administration: 410 mg (02/09/2019), 410 mg (03/03/2019) etoposide (VEPESID) 180 mg in sodium chloride 0.9 % 500 mL chemo infusion, 100 mg/m2 = 180 mg, Intravenous,  Once, 2 of 4 cycles Administration: 180 mg (02/09/2019), 180 mg (02/10/2019), 180 mg (02/11/2019), 180 mg  (03/03/2019), 180 mg (03/04/2019), 180 mg (03/05/2019)  for chemotherapy treatment.     Interval history-patient presents to [facility] today for complaints of dark stools x2 to 3 weeks.  States he has 2 bowel movements a week (normal) and they have been dark in color.  He has not seen any bright red blood.  He has generalized abdominal pain intermittently.  This is a chronic problem.  He also has intermittent cramping.  This is been present for greater than 4 years after an accident requiring major abdominal surgery.  He was started on iron supplements after a reaction to IV Feraheme.  States he is tolerating them fine.  Denies constipation.  That was about 3 weeks ago.  He denies any fevers, recent illness, bruising, chest pain, nausea, vomiting, constipation or diarrhea.  He denies any urinary complaints.  He maintains a good appetite and denies weight loss.  He is currently on carbo/etoposide every 3 weeks with concurrent radiation.  He is scheduled to finish in early October.  ECOG FS:0 - Asymptomatic  Review of systems- Review of Systems  Constitutional: Positive for malaise/fatigue.  Gastrointestinal: Positive for abdominal pain (Chronic) and melena.  Psychiatric/Behavioral: The patient is nervous/anxious.      Current treatment-status post 2 cycles of carbo/etoposide.  Last cycle was on 03/03/2019 through 03/05/2019.  He receives daily radiation  Allergies  Allergen Reactions  . Amoxicillin Hives  . Codeine Nausea Only     Past Medical History:  Diagnosis Date  . Asthma   . Cerebral aneurysm   . Chronic painful diabetic neuropathy ([place])   . Depression   . Diabetes mellitus  without complication ([place])   . Essential hypertension   . History of kidney stones   . Occasional tremors   . Tobacco use      Past Surgical History:  Procedure Laterality Date  . ABDOMINAL SURGERY     age 44. trauma surgery due to forklift injury- liver and spleen  . IR GENERIC HISTORICAL  05/31/2016   IR ANGIO  VERTEBRAL SEL VERTEBRAL BILAT MOD SED 05/31/2016 Consuella Lose, MD [facility]  . IR GENERIC HISTORICAL  05/31/2016   IR ANGIO INTRA EXTRACRAN SEL INTERNAL CAROTID BILAT MOD SED 05/31/2016 Consuella Lose, MD [facility]  . PORTACATH PLACEMENT Right 02/04/2019   Procedure: INSERTION PORT-A-CATH;  Surgeon: [name], MD;  Location: [facility] ORS;  Service: General;  Laterality: Right;  . THORACOTOMY Left 01/22/2019   Procedure: PRE OP BRONCH LEFT ANTERIOR THORACOTOMY WITH BIOPSY OF MEDIASTINAL MASS;  Surgeon: [name], MD;  Location: [facility] ORS;  Service: General;  Laterality: Left;    Social History   Socioeconomic History  . Marital status: Married    Spouse name: Not on file  . Number of children: Not on file  . Years of education: Not on file  . Highest education level: Not on file  Occupational History  . Not on file  Social Needs  . Financial resource strain: Not on file  . Food insecurity    Worry: Not on file    Inability: Not on file  . Transportation needs    Medical: Not on file    Non-medical: Not on file  Tobacco Use  . Smoking status: Current Every Day Smoker    Packs/day: 0.50    Years: 50.00    Pack years: 25.00    Types: Cigarettes  . Smokeless tobacco: Never Used  Substance and Sexual Activity  . Alcohol use: No  . Drug use: Yes    Types: Barbituates, Marijuana  . Sexual activity: Not on file  Lifestyle  . Physical activity    Days per week: Not on file    Minutes per session: Not on file  . Stress: Not on file  Relationships  . Social Herbalist on phone: Not on file    Gets together: Not on file    Attends religious service: Not on file    Active member of club or organization: Not on file    Attends meetings of clubs or organizations: Not on file    Relationship status: Not on file  . Intimate partner violence    Fear of current or ex partner: Not on file    Emotionally abused: Not on file    Physically abused: Not on file     Forced sexual activity: Not on file  Other Topics Concern  . Not on file  Social History Narrative   Lives in [place]; wife/ son/grandson [custody]; smoker; vending business; hx of alcoholism- quit at 85.     Family History  Problem Relation Age of Onset  . Lymphoma Father   . Liver cancer Paternal Uncle   . Lung cancer Paternal Uncle   . Lung cancer Maternal Uncle      Current Outpatient Medications:  .  amLODipine (NORVASC) 10 MG tablet, Take 10 mg by mouth daily., Disp: , Rfl:  .  Budesonide-Formoterol Fumarate (SYMBICORT IN), Inhale into the lungs 2 (two) times daily. , Disp: , Rfl:  .  DULoxetine (CYMBALTA) 30 MG capsule, Take 30 mg by mouth daily., Disp: , Rfl:  .  ferrous sulfate 324 (65 Fe) MG TBEC, Take 2 tablets by mouth daily., Disp: , Rfl:  .  gabapentin (NEURONTIN) 600 MG tablet, Take 600 mg by mouth every 6 (six) hours as needed (pain. Not to exceed 3600 mg). , Disp: , Rfl:  .  glipiZIDE (GLUCOTROL XL) 10 MG 24 hr tablet, Take 10 mg by mouth daily with breakfast., Disp: , Rfl:  .  hydrochlorothiazide (HYDRODIURIL) 25 MG tablet, Take 1 tablet by mouth daily., Disp: , Rfl:  .  lidocaine-prilocaine (EMLA) cream, Apply 1 application topically as needed. 30-45 mins prior to port access., Disp: 30 g, Rfl: 0 .  lovastatin (MEVACOR) 20 MG tablet, Take 20 mg by mouth every evening., Disp: , Rfl:  .  metFORMIN (GLUCOPHAGE) 1000 MG tablet, Take 1,000 mg by mouth 2 (two) times daily with a meal., Disp: , Rfl:  .  Multiple Vitamins-Minerals (MULTIVITAMIN WITH MINERALS) tablet, Take 1 tablet by mouth daily., Disp: , Rfl:  .  ondansetron (ZOFRAN) 8 MG tablet, Take 1 tablet (8 mg total) by mouth every 8 (eight) hours as needed for nausea or vomiting (start 3 days; after chemo)., Disp: 40 tablet, Rfl: 1 .  prazosin (MINIPRESS) 1 MG capsule, Take 1 mg by mouth at bedtime., Disp: , Rfl:  .  prochlorperazine (COMPAZINE) 10 MG tablet, Take 1 tablet (10 mg total) by mouth every 6 (six)  hours as needed for nausea or vomiting., Disp: 40 tablet, Rfl: 1 .  psyllium (METAMUCIL) 58.6 % powder, Take 1 packet by mouth daily., Disp: , Rfl:  .  sitaGLIPtin (JANUVIA) 25 MG tablet, Take 25 mg by mouth daily., Disp: , Rfl:  .  sucralfate (CARAFATE) 1 GM/10ML suspension, Take 57ml three times daily  30 minutes before meals, Disp: 420 mL, Rfl: 3 .  traMADol (ULTRAM) 50 MG tablet, Take 1 tablet (50 mg total) by mouth every 8 (eight) hours as needed., Disp: 60 tablet, Rfl: 0 .  vitamin B-12 (CYANOCOBALAMIN) 100 MCG tablet, Take 100 mcg by mouth daily., Disp: , Rfl:  .  oxyCODONE-acetaminophen (PERCOCET/ROXICET) 5-325 MG tablet, Take 1-2 tablets by mouth every 4 (four) hours as needed for moderate pain. (Patient not taking: Reported on 02/09/2019), Disp: 30 tablet, Rfl: 0  Physical exam:  Vitals:   03/12/19 1009  BP: (!) 149/67  Pulse: 73  Resp: 16  Temp: 98.1 F (36.7 C)  TempSrc: Tympanic  Weight: 142 lb 9.6 oz (64.7 kg)   Physical Exam Abdominal:     General: Bowel sounds are normal.     Palpations: Abdomen is soft.  Neurological:     Mental Status: He is alert.      CMP Latest Ref Rng & Units 03/12/2019  Glucose 70 - 99 mg/dL 280(H)  BUN 8 - 23 mg/dL 16  Creatinine 0.61 - 1.24 mg/dL 0.92  Sodium 135 - 145 mmol/L 136  Potassium 3.5 - 5.1 mmol/L 3.9  Chloride 98 - 111 mmol/L 99  CO2 22 - 32 mmol/L 27  Calcium 8.9 - 10.3 mg/dL 8.9  Total Protein 6.5 - 8.1 g/dL 6.7  Total Bilirubin 0.3 - 1.2 mg/dL 0.3  Alkaline Phos 38 - 126 U/L 57  AST 15 - 41 U/L 15  ALT 0 - 44 U/L 18   CBC Latest Ref Rng & Units 03/12/2019  WBC 4.0 - 10.5 K/uL 4.3  Hemoglobin 13.0 - 17.0 g/dL 8.2(L)  Hematocrit 39.0 - 52.0 % 26.4(L)  Platelets 150 - 400 K/uL 216    No images are  attached to the encounter.  No results found.  Assessment and plan- Patient is a 68 y.o. male who presents to [facility] for dark stools x2 to 3 weeks.  He was recently started on iron pills for iron deficiency anemia.  Had  allergic reaction to IV Feraheme.  Small cell lung cancer: Status post 2 cycles of carbo/etoposide.  Last given on 03/03/2019.  He is getting concurrent radiation.  Scheduled to complete treatment at the beginning of October.   Dark/black stools: Unclear etiology but likely related to beginning iron supplements.  Will check stat labs and asked him to complete stool cards.  He has not had a colonoscopy recently.  He was instructed to complete Cologuard in the next few weeks by his PCP.  Plan: Stat labs. (Improving hemoglobin) Stool cards x3.  He was instructed to return cards to clinic when complete. Continue iron supplements.  Disposition: Continue iron supplements. Return stool cards when complete. Return daily for radiation. RTC on 03/24/2019 for MD assessment, labs and cycle 3. He was instructed to call clinic if symptoms worsened.   Visit Diagnosis 1. 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 Anemia, unspecified type     Patient expressed understanding and was in agreement with this plan. He also understands that He can call clinic at any time with any questions, concerns, or complaints.   Greater than 50% was spent in counseling and coordination of care with this patient including but not limited to discussion of the relevant topics above (See A&P) including, but not limited to diagnosis and management of acute and chronic medical conditions.   Thank you for allowing me to participate in the care of this very pleasant patient.    Jacquelin Hawking, NP [place] at [facility] Cell - [phone] Pager- [phone] 03/12/2019 1:22 PM   CC: Dr. Rogue Bussing

## 2019-03-13 ENCOUNTER — Other Ambulatory Visit: Payer: Self-pay

## 2019-03-13 ENCOUNTER — Ambulatory Visit
Admission: RE | Admit: 2019-03-13 | Discharge: 2019-03-13 | Disposition: A | Discharge status: Home / Self Care | Payer: BC Managed Care – PPO | Source: Ambulatory Visit | Attending: Radiation Oncology | Admitting: Radiation Oncology

## 2019-03-13 DIAGNOSIS — C781 Secondary malignant neoplasm of mediastinum: Secondary | ICD-10-CM | POA: Diagnosis not present

## 2019-03-13 DIAGNOSIS — Z5111 Encounter for antineoplastic chemotherapy: Secondary | ICD-10-CM | POA: Diagnosis not present

## 2019-03-16 ENCOUNTER — Ambulatory Visit
Admission: RE | Admit: 2019-03-16 | Discharge: 2019-03-16 | Disposition: A | Discharge status: Home / Self Care | Payer: BC Managed Care – PPO | Source: Ambulatory Visit | Attending: Radiation Oncology | Admitting: Radiation Oncology

## 2019-03-16 ENCOUNTER — Other Ambulatory Visit: Payer: Self-pay

## 2019-03-16 DIAGNOSIS — C781 Secondary malignant neoplasm of mediastinum: Secondary | ICD-10-CM | POA: Diagnosis not present

## 2019-03-17 ENCOUNTER — Other Ambulatory Visit: Payer: Self-pay

## 2019-03-17 ENCOUNTER — Ambulatory Visit
Admission: RE | Admit: 2019-03-17 | Discharge: 2019-03-17 | Disposition: A | Discharge status: Home / Self Care | Payer: BC Managed Care – PPO | Source: Ambulatory Visit | Attending: Radiation Oncology | Admitting: Radiation Oncology

## 2019-03-17 ENCOUNTER — Other Ambulatory Visit: Payer: Self-pay | Admitting: *Deleted

## 2019-03-17 DIAGNOSIS — D649 Anemia, unspecified: Secondary | ICD-10-CM

## 2019-03-17 DIAGNOSIS — Z5111 Encounter for antineoplastic chemotherapy: Secondary | ICD-10-CM | POA: Diagnosis not present

## 2019-03-17 DIAGNOSIS — C781 Secondary malignant neoplasm of mediastinum: Secondary | ICD-10-CM | POA: Diagnosis not present

## 2019-03-17 LAB — OCCULT BLOOD X 1 CARD TO LAB, STOOL
Fecal Occult Bld: POSITIVE — AB
Fecal Occult Bld: POSITIVE — AB

## 2019-03-18 ENCOUNTER — Other Ambulatory Visit: Payer: Self-pay | Admitting: Internal Medicine

## 2019-03-18 ENCOUNTER — Telehealth: Payer: Self-pay | Admitting: *Deleted

## 2019-03-18 ENCOUNTER — Ambulatory Visit
Admission: RE | Admit: 2019-03-18 | Discharge: 2019-03-18 | Disposition: A | Discharge status: Home / Self Care | Payer: BC Managed Care – PPO | Source: Ambulatory Visit | Attending: Radiation Oncology | Admitting: Radiation Oncology

## 2019-03-18 ENCOUNTER — Other Ambulatory Visit: Payer: Self-pay

## 2019-03-18 DIAGNOSIS — C781 Secondary malignant neoplasm of mediastinum: Secondary | ICD-10-CM

## 2019-03-18 DIAGNOSIS — R195 Other fecal abnormalities: Secondary | ICD-10-CM

## 2019-03-18 NOTE — Progress Notes (Signed)
Pt has venofer ordered

## 2019-03-18 NOTE — Telephone Encounter (Signed)
Spoke with patient. Pt agreeable to consult with [facility] for occult positive stool. Referral initiated.

## 2019-03-18 NOTE — Telephone Encounter (Signed)
-----   Message from Cammie Sickle, MD sent at 03/18/2019  7:41 AM EDT ----- H/B- please inform patient that his stool occult is positive for blood.  Please make urgent referral to  GI re: further work up.  GB

## 2019-03-19 ENCOUNTER — Ambulatory Visit
Admission: RE | Admit: 2019-03-19 | Discharge: 2019-03-19 | Disposition: A | Discharge status: Home / Self Care | Payer: BC Managed Care – PPO | Source: Ambulatory Visit | Attending: Radiation Oncology | Admitting: Radiation Oncology

## 2019-03-19 ENCOUNTER — Ambulatory Visit (INDEPENDENT_AMBULATORY_CARE_PROVIDER_SITE_OTHER): Payer: BC Managed Care – PPO | Admitting: Gastroenterology

## 2019-03-19 ENCOUNTER — Other Ambulatory Visit: Payer: Self-pay

## 2019-03-19 ENCOUNTER — Encounter (INDEPENDENT_AMBULATORY_CARE_PROVIDER_SITE_OTHER): Payer: Self-pay

## 2019-03-19 ENCOUNTER — Encounter: Payer: Self-pay | Admitting: Gastroenterology

## 2019-03-19 VITALS — BP 156/71 | HR 79 | Temp 98.0°F | Ht 68.0 in | Wt 144.4 lb

## 2019-03-19 DIAGNOSIS — K921 Melena: Secondary | ICD-10-CM

## 2019-03-19 DIAGNOSIS — K5900 Constipation, unspecified: Secondary | ICD-10-CM

## 2019-03-19 DIAGNOSIS — C781 Secondary malignant neoplasm of mediastinum: Secondary | ICD-10-CM | POA: Diagnosis not present

## 2019-03-19 MED ORDER — OMEPRAZOLE 40 MG PO CPDR: 40.0000 mg | DELAYED_RELEASE_CAPSULE | Freq: Every day | ORAL | 1 refills | Status: DC

## 2019-03-19 NOTE — Progress Notes (Signed)
Jonathon Bellows MD, MRCP(U.K) [address]  [place]  [place], [place] [address]  Main: [phone]  Fax: [phone]   Gastroenterology Consultation  Referring Provider:     Cammie Sickle, * Primary Care Physician:  Laneta Simmers, NP Primary Gastroenterologist:  Dr. Jonathon Bellows  Reason for Consultation:    Positive stool occult test.        HPI:   Kevin Mckay is a 68 y.o. y/o male referred by Dr. Carmela Rima a positive stool occult test.  He has a history of small cell lung cancer metastatic to the mediastinal lymph nodes.  Diagnosed with iron deficiency anemia in August 2020.  On IV iron.  Having some issues swallowing.  Seen at the cancer center on 03/02/2019 and complained about black stools.  Hemoglobin 8.2 g with an MCV of 77.9.  1 month back hemoglobin 9.2 g.  No rise in the BUN/creatinine ratio.  He states that he has been having black-colored stools for the past few weeks.  He denies any abdominal pain but he does have some nonspecific cramping all over the abdomen.  He has 2 bowel movements a week.  Denies any prior history of a colonoscopy.  Denies any prior endoscopy.  Denies any NSAID use.  He is undergoing chemotherapy and radiation for his lung cancer.  Past Medical History:  Diagnosis Date  . Asthma   . Cerebral aneurysm   . Chronic painful diabetic neuropathy ([place])   . Depression   . Diabetes mellitus without complication ([place])   . Essential hypertension   . History of kidney stones   . Occasional tremors   . Tobacco use     Past Surgical History:  Procedure Laterality Date  . ABDOMINAL SURGERY     age 44. trauma surgery due to forklift injury- liver and spleen  . IR GENERIC HISTORICAL  05/31/2016   IR ANGIO VERTEBRAL SEL VERTEBRAL BILAT MOD SED 05/31/2016 Consuella Lose, MD [facility]  . IR GENERIC HISTORICAL  05/31/2016   IR ANGIO INTRA EXTRACRAN SEL INTERNAL CAROTID BILAT MOD SED 05/31/2016 Consuella Lose, MD [facility]  .  PORTACATH PLACEMENT Right 02/04/2019   Procedure: INSERTION PORT-A-CATH;  Surgeon: [name], MD;  Location: [facility] ORS;  Service: General;  Laterality: Right;  . THORACOTOMY Left 01/22/2019   Procedure: PRE OP BRONCH LEFT ANTERIOR THORACOTOMY WITH BIOPSY OF MEDIASTINAL MASS;  Surgeon: [name], MD;  Location: [facility] ORS;  Service: General;  Laterality: Left;    Prior to Admission medications   Medication Sig Start Date End Date Taking? Authorizing Provider  amLODipine (NORVASC) 10 MG tablet Take 10 mg by mouth daily.    [provider]  Budesonide-Formoterol Fumarate (SYMBICORT IN) Inhale into the lungs 2 (two) times daily.     [provider]  DULoxetine (CYMBALTA) 30 MG capsule Take 30 mg by mouth daily.    [provider]  ferrous sulfate 324 (65 Fe) MG TBEC Take 2 tablets by mouth daily.    [provider]  gabapentin (NEURONTIN) 600 MG tablet Take 600 mg by mouth every 6 (six) hours as needed (pain. Not to exceed 3600 mg).     [provider]  glipiZIDE (GLUCOTROL XL) 10 MG 24 hr tablet Take 10 mg by mouth daily with breakfast.    [provider]  hydrochlorothiazide (HYDRODIURIL) 25 MG tablet Take 1 tablet by mouth daily. 01/06/19   [provider]  lidocaine-prilocaine (EMLA) cream Apply 1 application topically as needed. 30-45 mins  prior to port access. 01/29/19   Cammie Sickle, MD  lovastatin (MEVACOR) 20 MG tablet Take 20 mg by mouth every evening.    [provider]  metFORMIN (GLUCOPHAGE) 1000 MG tablet Take 1,000 mg by mouth 2 (two) times daily with a meal.    [provider]  Multiple Vitamins-Minerals (MULTIVITAMIN WITH MINERALS) tablet Take 1 tablet by mouth daily.    [provider]  ondansetron (ZOFRAN) 8 MG tablet Take 1 tablet (8 mg total) by mouth every 8 (eight) hours as needed for nausea or vomiting (start 3 days; after chemo). 01/29/19   Cammie Sickle, MD   oxyCODONE-acetaminophen (PERCOCET/ROXICET) 5-325 MG tablet Take 1-2 tablets by mouth every 4 (four) hours as needed for moderate pain. Patient not taking: Reported on 02/09/2019 01/24/19   [name], MD  prazosin (MINIPRESS) 1 MG capsule Take 1 mg by mouth at bedtime.    [provider]  prochlorperazine (COMPAZINE) 10 MG tablet Take 1 tablet (10 mg total) by mouth every 6 (six) hours as needed for nausea or vomiting. 01/29/19   Cammie Sickle, MD  psyllium (METAMUCIL) 58.6 % powder Take 1 packet by mouth daily.    [provider]  sitaGLIPtin (JANUVIA) 25 MG tablet Take 25 mg by mouth daily.    [provider]  sucralfate (CARAFATE) 1 GM/10ML suspension Take 46ml three times daily  30 minutes before meals 03/04/19   Cammie Sickle, MD  traMADol (ULTRAM) 50 MG tablet Take 1 tablet (50 mg total) by mouth every 8 (eight) hours as needed. 03/03/19   Cammie Sickle, MD  vitamin B-12 (CYANOCOBALAMIN) 100 MCG tablet Take 100 mcg by mouth daily.    [provider]    Family History  Problem Relation Age of Onset  . Lymphoma Father   . Liver cancer Paternal Uncle   . Lung cancer Paternal Uncle   . Lung cancer Maternal Uncle      Social History   Tobacco Use  . Smoking status: Current Every Day Smoker    Packs/day: 0.50    Years: 50.00    Pack years: 25.00    Types: Cigarettes  . Smokeless tobacco: Never Used  Substance Use Topics  . Alcohol use: No  . Drug use: Yes    Types: Barbituates, Marijuana    Allergies as of 03/19/2019 - Review Complete 03/12/2019  Allergen Reaction Noted  . Amoxicillin Hives 03/31/2015  . Codeine Nausea Only 03/31/2015    Review of Systems:    All systems reviewed and negative except where noted in HPI.   Physical Exam:  There were no vitals taken for this visit. No LMP for male patient. Psych:  Alert and cooperative. Normal mood and affect. General:   Alert,  Well-developed, well-nourished, pleasant  and cooperative in NAD Head:  Normocephalic and atraumatic. Eyes:  Sclera clear, no icterus.   Conjunctiva pink. Ears:  Normal auditory acuity. Nose:  No deformity, discharge, or lesions. Mouth:  No deformity or lesions,oropharynx pink & moist. Neck:  Supple; no masses or thyromegaly. Lungs:  Respirations even and unlabored.  Clear throughout to auscultation.   No wheezes, crackles, or rhonchi. No acute distress. Heart:  Regular rate and rhythm; no murmurs, clicks, rubs, or gallops. Abdomen:  Normal bowel sounds.  No bruits.  Soft, non-tender and non-distended without masses, hepatosplenomegaly or hernias noted.  No guarding or rebound tenderness.    Neurologic:  Alert and oriented x3;  grossly normal neurologically. Skin:  Intact without significant lesions or rashes. No jaundice. Lymph Nodes:  No significant cervical adenopathy. Psych:  Alert and cooperative. Normal mood and affect.  Imaging Studies: No results found.  Assessment and Plan:   Kevin Mckay is a 68 y.o. y/o male with a history of metastatic small cell lung cancer has been referred to see me for melena, stool occult blood positive in the setting of iron deficiency anemia for which he is receiving IV iron.  Plan 1.  EGD + colonoscopy and if negative can consider capsule study of the small bowel  2.  Commence on Prilosec 40 mg a day. 3.  H. pylori breath test. 4.  Commence on Linzess 145 mcg samples will be provided for constipation.   I have discussed alternative options, risks & benefits,  which include, but are not limited to, bleeding, infection, perforation,respiratory complication & drug reaction.  The patient agrees with this plan & written consent will be obtained.    Follow up in 2 to 3 weeks  Dr Jonathon Bellows MD,MRCP(U.K)

## 2019-03-20 ENCOUNTER — Other Ambulatory Visit: Payer: Self-pay

## 2019-03-20 ENCOUNTER — Ambulatory Visit
Admission: RE | Admit: 2019-03-20 | Discharge: 2019-03-20 | Disposition: A | Discharge status: Home / Self Care | Payer: BC Managed Care – PPO | Source: Ambulatory Visit | Attending: Radiation Oncology | Admitting: Radiation Oncology

## 2019-03-20 DIAGNOSIS — C781 Secondary malignant neoplasm of mediastinum: Secondary | ICD-10-CM | POA: Diagnosis not present

## 2019-03-20 LAB — H. PYLORI BREATH TEST: H pylori Breath Test: NEGATIVE

## 2019-03-21 ENCOUNTER — Encounter: Payer: Self-pay | Admitting: Gastroenterology

## 2019-03-23 ENCOUNTER — Other Ambulatory Visit: Payer: Self-pay

## 2019-03-23 ENCOUNTER — Ambulatory Visit
Admission: RE | Admit: 2019-03-23 | Discharge: 2019-03-23 | Disposition: A | Discharge status: Home / Self Care | Payer: BC Managed Care – PPO | Source: Ambulatory Visit | Attending: Radiation Oncology | Admitting: Radiation Oncology

## 2019-03-23 DIAGNOSIS — C781 Secondary malignant neoplasm of mediastinum: Secondary | ICD-10-CM | POA: Diagnosis not present

## 2019-03-23 NOTE — Progress Notes (Signed)
Patient is coming in for follow up he is doing well no major complaints

## 2019-03-24 ENCOUNTER — Other Ambulatory Visit: Payer: Self-pay | Admitting: *Deleted

## 2019-03-24 ENCOUNTER — Other Ambulatory Visit: Payer: Self-pay

## 2019-03-24 ENCOUNTER — Telehealth: Payer: Self-pay | Admitting: Gastroenterology

## 2019-03-24 ENCOUNTER — Telehealth: Payer: Self-pay

## 2019-03-24 ENCOUNTER — Ambulatory Visit
Admission: RE | Admit: 2019-03-24 | Discharge: 2019-03-24 | Disposition: A | Discharge status: Home / Self Care | Payer: BC Managed Care – PPO | Source: Ambulatory Visit | Attending: Radiation Oncology | Admitting: Radiation Oncology

## 2019-03-24 ENCOUNTER — Inpatient Hospital Stay (HOSPITAL_BASED_OUTPATIENT_CLINIC_OR_DEPARTMENT_OTHER): Payer: BC Managed Care – PPO | Admitting: Internal Medicine

## 2019-03-24 ENCOUNTER — Inpatient Hospital Stay: Payer: BC Managed Care – PPO

## 2019-03-24 VITALS — BP 133/80 | HR 86 | Temp 98.3°F | Resp 20 | Ht 68.0 in | Wt 145.0 lb

## 2019-03-24 DIAGNOSIS — C801 Malignant (primary) neoplasm, unspecified: Secondary | ICD-10-CM

## 2019-03-24 DIAGNOSIS — D649 Anemia, unspecified: Secondary | ICD-10-CM

## 2019-03-24 DIAGNOSIS — C781 Secondary malignant neoplasm of mediastinum: Secondary | ICD-10-CM

## 2019-03-24 DIAGNOSIS — Z5111 Encounter for antineoplastic chemotherapy: Secondary | ICD-10-CM | POA: Diagnosis not present

## 2019-03-24 DIAGNOSIS — Z95828 Presence of other vascular implants and grafts: Secondary | ICD-10-CM

## 2019-03-24 LAB — COMPREHENSIVE METABOLIC PANEL
ALT: 14 U/L (ref 0–44)
AST: 13 U/L — ABNORMAL LOW (ref 15–41)
Albumin: 3.1 g/dL — ABNORMAL LOW (ref 3.5–5.0)
Alkaline Phosphatase: 60 U/L (ref 38–126)
Anion gap: 9 (ref 5–15)
BUN: 9 mg/dL (ref 8–23)
CO2: 27 mmol/L (ref 22–32)
Calcium: 8.2 mg/dL — ABNORMAL LOW (ref 8.9–10.3)
Chloride: 98 mmol/L (ref 98–111)
Creatinine, Ser: 0.87 mg/dL (ref 0.61–1.24)
GFR calc Af Amer: >60 mL/min (ref >60)
GFR calc non Af Amer: >60 mL/min (ref >60)
Glucose, Bld: 240 mg/dL — ABNORMAL HIGH (ref 70–99)
Potassium: 3.4 mmol/L — ABNORMAL LOW (ref 3.5–5.1)
Sodium: 134 mmol/L — ABNORMAL LOW (ref 135–145)
Total Bilirubin: 0.5 mg/dL (ref 0.3–1.2)
Total Protein: 6 g/dL — ABNORMAL LOW (ref 6.5–8.1)

## 2019-03-24 LAB — CBC WITH DIFFERENTIAL/PLATELET
Abs Immature Granulocytes: 0.11 10*3/uL — ABNORMAL HIGH (ref 0.00–0.07)
Basophils Absolute: 0 10*3/uL (ref 0.0–0.1)
Basophils Relative: 0 %
Eosinophils Absolute: 0 10*3/uL (ref 0.0–0.5)
Eosinophils Relative: 0 %
HCT: 21.6 % — ABNORMAL LOW (ref 39.0–52.0)
Hemoglobin: 6.6 g/dL — ABNORMAL LOW (ref 13.0–17.0)
Immature Granulocytes: 3 %
Lymphocytes Relative: 22 %
Lymphs Abs: 0.9 10*3/uL (ref 0.7–4.0)
MCH: 24.3 pg — ABNORMAL LOW (ref 26.0–34.0)
MCHC: 30.6 g/dL (ref 30.0–36.0)
MCV: 79.4 fL — ABNORMAL LOW (ref 80.0–100.0)
Monocytes Absolute: 0.7 10*3/uL (ref 0.1–1.0)
Monocytes Relative: 16 %
Neutro Abs: 2.5 10*3/uL (ref 1.7–7.7)
Neutrophils Relative %: 59 %
Platelets: 243 10*3/uL (ref 150–400)
RBC: 2.72 MIL/uL — ABNORMAL LOW (ref 4.22–5.81)
RDW: 21.7 % — ABNORMAL HIGH (ref 11.5–15.5)
WBC: 4.2 10*3/uL (ref 4.0–10.5)
nRBC: 0 % (ref 0.0–0.2)

## 2019-03-24 LAB — PREPARE RBC (CROSSMATCH)

## 2019-03-24 MED ORDER — ACETAMINOPHEN 325 MG PO TABS
650.0000 mg | ORAL_TABLET | Freq: Once | ORAL | Status: AC
  Administered 2019-03-24: 11:00:00 650 mg via ORAL
  Filled 2019-03-24: qty 2

## 2019-03-24 MED ORDER — HEPARIN SOD (PORK) LOCK FLUSH 100 UNIT/ML IV SOLN
500.0000 [IU] | Freq: Once | INTRAVENOUS | Status: AC
  Administered 2019-03-24: 16:00:00 500 [IU] via INTRAVENOUS

## 2019-03-24 MED ORDER — SODIUM CHLORIDE 0.9% FLUSH
10.0000 mL | Freq: Once | INTRAVENOUS | Status: AC
  Administered 2019-03-24: 10 mL via INTRAVENOUS
  Filled 2019-03-24: qty 10

## 2019-03-24 MED ORDER — SODIUM CHLORIDE 0.9% IV SOLUTION
250.0000 mL | Freq: Once | INTRAVENOUS | Status: AC
  Administered 2019-03-24: 250 mL via INTRAVENOUS
  Filled 2019-03-24: qty 250

## 2019-03-24 MED ORDER — DIPHENHYDRAMINE HCL 25 MG PO CAPS
25.0000 mg | ORAL_CAPSULE | Freq: Once | ORAL | Status: AC
  Administered 2019-03-24: 11:00:00 25 mg via ORAL
  Filled 2019-03-24: qty 1

## 2019-03-24 MED ORDER — FUROSEMIDE 20 MG PO TABS
20.0000 mg | ORAL_TABLET | Freq: Once | ORAL | Status: AC
  Administered 2019-03-24: 20 mg via ORAL

## 2019-03-24 NOTE — Telephone Encounter (Signed)
Called pt to inform him that Dr. Vicente Males gave instructions to reschedule pt endoscopy procedure to this Friday 03-27-19 due to pt low Hemoglobin levels. Unable to contact pt but was able to to speak with pt wife, I have explained and she agrees. She is aware that pt will need to go for COVID test today. [facility] has been notified.

## 2019-03-24 NOTE — Progress Notes (Signed)
[facility] NOTE  Patient Care Team: Laneta Simmers, NP as PCP - General (Nurse Practitioner) Telford Nab, RN as Registered Nurse  CHIEF COMPLAINTS/PURPOSE OF CONSULTATION: Lung cancer   Oncology History Overview Note  # July 2020- SMALL CELL CA METASTATIC TO MEDIASTINAL LN [open Bx; Dr.Oaks]; TxN2M0; July 2nd 2020-PET- 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  MRI brain negative  # aug 17th 2020-carbo etoposide-RT [? 8/19]  #August 2020 iron deficient anemia-question etiology; IV Feraheme [no colonoscopy]  # COPD/  DM-2- on OHA/ smoker/ PVD/peripheral neuropathy  # History of alcohol abuse/quit 2014/ Hx of abdominal trauma [at 21y]  # DIAGNOSIS: SMALL CELL CA [? 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 [facility])   02/09/2019 -  Chemotherapy   The patient had palonosetron (ALOXI) injection 0.25 mg, 0.25 mg, Intravenous,  Once, 2 of 4 cycles Administration: 0.25 mg (02/09/2019), 0.25 mg (03/03/2019) CARBOplatin (PARAPLATIN) 410 mg in sodium chloride 0.9 % 250 mL chemo infusion, 410 mg (100 % of original dose 414 mg), Intravenous,  Once, 2 of 4 cycles Dose modification:   (original dose 414 mg, Cycle 1) Administration: 410 mg (02/09/2019), 410 mg (03/03/2019) etoposide (VEPESID) 180 mg in sodium chloride 0.9 % 500 mL chemo infusion, 100 mg/m2 = 180 mg, Intravenous,  Once, 2 of 4 cycles Administration: 180 mg (02/09/2019), 180 mg (02/10/2019), 180 mg (02/11/2019), 180 mg (03/03/2019), 180 mg (03/04/2019), 180 mg (03/05/2019) fosaprepitant (EMEND) 150 mg, dexamethasone (DECADRON) 6 mg in sodium chloride 0.9 % 145 mL IVPB, , Intravenous,  Once, 0 of 2 cycles  for chemotherapy treatment.     HISTORY OF  PRESENTING ILLNESS:  Kevin Mckay 68 y.o.  male limited stage small cell-likely lung primary is currently on concurrent chemoradiation carboplatin etoposide is here for follow-up.  In the interim patient was evaluated by GI; plan to have endoscopy-EGD colonoscopy on October 9.  Patient complains of worsening fatigue.  Feels knee is getting weak.   He complains of green-colored stools.  Denies any blood.  Continues to have shortness of breath on exertion.  Chronic tingling and numbness in extremities not significantly worse.  Continues to have mild difficulty swallowing; however not any worse.  Review of Systems  Constitutional: Positive for malaise/fatigue. Negative for chills, diaphoresis, fever and weight loss.  HENT: Negative for nosebleeds and sore throat.   Eyes: Negative for double vision.  Respiratory: Positive for cough, sputum production and shortness of breath. Negative for hemoptysis and wheezing.   Cardiovascular: Negative for chest pain, palpitations, orthopnea and leg swelling.  Gastrointestinal: Negative for abdominal pain, blood in stool, constipation, diarrhea, heartburn, melena, nausea and vomiting.  Genitourinary: Negative for dysuria, frequency and urgency.  Musculoskeletal: Negative for back pain and joint pain.  Skin: Negative.  Negative for itching and rash.  Neurological: Positive for tingling and headaches. Negative for dizziness, focal weakness and weakness.  Endo/Heme/Allergies: Does not bruise/bleed easily.  Psychiatric/Behavioral: Negative for depression. The patient is not nervous/anxious and does not have insomnia.      MEDICAL HISTORY:  Past Medical History:  Diagnosis Date  . Asthma   . Cerebral aneurysm   . Chronic painful diabetic neuropathy ([place])   . Depression   . Diabetes mellitus without complication ([place])   .  Essential hypertension   . History of kidney stones   . Occasional tremors   . Tobacco use     SURGICAL HISTORY: Past Surgical  History:  Procedure Laterality Date  . ABDOMINAL SURGERY     age 68. trauma surgery due to forklift injury- liver and spleen  . IR GENERIC HISTORICAL  05/31/2016   IR ANGIO VERTEBRAL SEL VERTEBRAL BILAT MOD SED 05/31/2016 Consuella Lose, MD [facility]  . IR GENERIC HISTORICAL  05/31/2016   IR ANGIO INTRA EXTRACRAN SEL INTERNAL CAROTID BILAT MOD SED 05/31/2016 Consuella Lose, MD [facility]  . PORTACATH PLACEMENT Right 02/04/2019   Procedure: INSERTION PORT-A-CATH;  Surgeon: [name], MD;  Location: [facility] ORS;  Service: General;  Laterality: Right;  . THORACOTOMY Left 01/22/2019   Procedure: PRE OP BRONCH LEFT ANTERIOR THORACOTOMY WITH BIOPSY OF MEDIASTINAL MASS;  Surgeon: [name], MD;  Location: [facility] ORS;  Service: General;  Laterality: Left;    SOCIAL HISTORY: Social History   Socioeconomic History  . Marital status: Married    Spouse name: Not on file  . Number of children: Not on file  . Years of education: Not on file  . Highest education level: Not on file  Occupational History  . Not on file  Social Needs  . Financial resource strain: Not on file  . Food insecurity    Worry: Not on file    Inability: Not on file  . Transportation needs    Medical: Not on file    Non-medical: Not on file  Tobacco Use  . Smoking status: Current Every Day Smoker    Packs/day: 0.50    Years: 50.00    Pack years: 25.00    Types: Cigarettes  . Smokeless tobacco: Never Used  Substance and Sexual Activity  . Alcohol use: No  . Drug use: Yes    Types: Barbituates, Marijuana  . Sexual activity: Not on file  Lifestyle  . Physical activity    Days per week: Not on file    Minutes per session: Not on file  . Stress: Not on file  Relationships  . Social Herbalist on phone: Not on file    Gets together: Not on file    Attends religious service: Not on file    Active member of club or organization: Not on file    Attends meetings of clubs or organizations: Not  on file    Relationship status: Not on file  . Intimate partner violence    Fear of current or ex partner: Not on file    Emotionally abused: Not on file    Physically abused: Not on file    Forced sexual activity: Not on file  Other Topics Concern  . Not on file  Social History Narrative   Lives in [place]; wife/ son/grandson [custody]; smoker; vending business; hx of alcoholism- quit at 3.     FAMILY HISTORY: Family History  Problem Relation Age of Onset  . Lymphoma Father   . Liver cancer Paternal Uncle   . Lung cancer Paternal Uncle   . Lung cancer Maternal Uncle     ALLERGIES:  is allergic to ferumoxytol; amoxicillin; and codeine.  MEDICATIONS:  Current Outpatient Medications  Medication Sig Dispense Refill  . amLODipine (NORVASC) 10 MG tablet Take 10 mg by mouth daily.    . Budesonide-Formoterol Fumarate (SYMBICORT IN) Inhale into the lungs 2 (two) times daily.     . DULoxetine (CYMBALTA) 30 MG capsule Take 30  mg by mouth daily.    . ferrous sulfate 324 (65 Fe) MG TBEC Take 2 tablets by mouth daily.    Marland Kitchen gabapentin (NEURONTIN) 600 MG tablet Take 600 mg by mouth every 6 (six) hours as needed (pain. Not to exceed 3600 mg).     Marland Kitchen glipiZIDE (GLUCOTROL XL) 10 MG 24 hr tablet Take 10 mg by mouth daily with breakfast.    . hydrochlorothiazide (HYDRODIURIL) 25 MG tablet Take 1 tablet by mouth daily.    Marland Kitchen lidocaine-prilocaine (EMLA) cream Apply 1 application topically as needed. 30-45 mins prior to port access. 30 g 0  . linaCLOtide (LINZESS PO) Take 1 capsule by mouth daily.    Marland Kitchen lovastatin (MEVACOR) 20 MG tablet Take 20 mg by mouth every evening.    . metFORMIN (GLUCOPHAGE) 1000 MG tablet Take 1,000 mg by mouth 2 (two) times daily with a meal.    . Multiple Vitamins-Minerals (MULTIVITAMIN WITH MINERALS) tablet Take 1 tablet by mouth daily.    Marland Kitchen omeprazole (PRILOSEC) 40 MG capsule Take 1 capsule (40 mg total) by mouth daily. 90 capsule 1  . ondansetron (ZOFRAN) 8 MG tablet  Take 1 tablet (8 mg total) by mouth every 8 (eight) hours as needed for nausea or vomiting (start 3 days; after chemo). 40 tablet 1  . oxyCODONE-acetaminophen (PERCOCET/ROXICET) 5-325 MG tablet Take 1-2 tablets by mouth every 4 (four) hours as needed for moderate pain. 30 tablet 0  . prazosin (MINIPRESS) 1 MG capsule Take 1 mg by mouth at bedtime.    . prochlorperazine (COMPAZINE) 10 MG tablet Take 1 tablet (10 mg total) by mouth every 6 (six) hours as needed for nausea or vomiting. 40 tablet 1  . psyllium (METAMUCIL) 58.6 % powder Take 1 packet by mouth daily.    . sitaGLIPtin (JANUVIA) 25 MG tablet Take 25 mg by mouth daily.    . sucralfate (CARAFATE) 1 GM/10ML suspension Take 48ml three times daily  30 minutes before meals 420 mL 3  . traMADol (ULTRAM) 50 MG tablet Take 1 tablet (50 mg total) by mouth every 8 (eight) hours as needed. 60 tablet 0  . vitamin B-12 (CYANOCOBALAMIN) 100 MCG tablet Take 100 mcg by mouth daily.     Current Facility-Administered Medications  Medication Dose Route Frequency Provider Last Rate Last Dose  . furosemide (LASIX) tablet 20 mg  20 mg Oral Once Cammie Sickle, MD          .  PHYSICAL EXAMINATION: ECOG PERFORMANCE STATUS: 0 - Asymptomatic  Vitals:   03/24/19 0846  BP: 133/80  Pulse: 86  Resp: 20  Temp: 98.3 F (36.8 C)   Filed Weights   03/24/19 0846  Weight: 145 lb (65.8 kg)    Physical Exam  Constitutional: He is oriented to person, place, and time and well-developed, well-nourished, and in no distress.  HENT:  Head: Normocephalic and atraumatic.  Mouth/Throat: Oropharynx is clear and moist. No oropharyngeal exudate.  Eyes: Pupils are equal, round, and reactive to light.  Neck: Normal range of motion. Neck supple.  Cardiovascular: Normal rate and regular rhythm.  Pulmonary/Chest: No respiratory distress. He has no wheezes.  Decreased air entry bilaterally.  Abdominal: Soft. Bowel sounds are normal. He exhibits no distension and  no mass. There is no abdominal tenderness. There is no rebound and no guarding.  Musculoskeletal: Normal range of motion.        General: No tenderness or edema.  Neurological: He is alert and oriented to person, place,  and time.  Skin: Skin is warm.  Psychiatric: Affect normal.    LABORATORY DATA:  I have reviewed the data as listed Lab Results  Component Value Date   WBC 4.2 03/24/2019   HGB 6.6 (L) 03/24/2019   HCT 21.6 (L) 03/24/2019   MCV 79.4 (L) 03/24/2019   PLT 243 03/24/2019   Recent Labs    03/03/19 0830 03/12/19 0947 03/24/19 0828  NA 135 136 134*  K 3.6 3.9 3.4*  CL 99 99 98  CO2 26 27 27   GLUCOSE 212* 280* 240*  BUN 13 16 9   CREATININE 0.85 0.92 0.87  CALCIUM 8.9 8.9 8.2*  GFRNONAA >60 >60 >60  GFRAA >60 >60 >60  PROT 6.6 6.7 6.0*  ALBUMIN 3.4* 3.6 3.1*  AST 12* 15 13*  ALT 11 18 14   ALKPHOS 68 57 60  BILITOT 0.4 0.3 0.5    RADIOGRAPHIC STUDIES: I have personally reviewed the radiological images as listed and agreed with the findings in the report. No results found.  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  TX N2 M0. on [place] etoposide-every 3 weeks with concurrent radiation [finish on Oct 1st].   #Patient tolerating chemoradiation therapy fairly well; however feeling fatigued/see below  # HOLD Carbo-Etop cycle #3 today. Labs today reviewed;   NOT acceptable for treatment today as Hb 6.6 [see below]  # Anemia-hemoglobin 6.6/ iron deficiency-Hemoccult-positive stools; suspect GI bleed chronic.  # pain sec to swallowing-second radiation-i stable await EGD.  # DM-blood glucose this morning 240-stable.   # I offered to call the patient's family to give an update on clinical status; patient declines.    # DISPOSITION: # HOLD CHEMO this week # 2 units PRBC today.  # follow up in 2 weeks- MD/labs- cbc/cmp/HOLD tube-carbo-etop  [days-1-2-3]- Dr.B  Addendum: Discussed with Dr. Vicente Males; kindly agrees to move the endoscopies sooner 02/02.  Patient informed.  All questions were answered. The patient knows to call the clinic with any problems, questions or concerns.    Cammie Sickle, MD 03/24/2019 12:41 PM

## 2019-03-24 NOTE — Telephone Encounter (Signed)
[name] left vm regarding pt he did not make it to his Covid testing due to being at the Cancer center today he has a procedure Friday please call her back

## 2019-03-24 NOTE — Assessment & Plan Note (Addendum)
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 on [place] etoposide-every 3 weeks with concurrent radiation [finish on Oct 1st].   #Patient tolerating chemoradiation therapy fairly well; however feeling fatigued/see below  # HOLD Carbo-Etop cycle #3 today. Labs today reviewed;   NOT acceptable for treatment today as Hb 6.6 [see below]  # Anemia-hemoglobin 6.6/ iron deficiency-Hemoccult-positive stools; suspect GI bleed chronic.  # pain sec to swallowing-second radiation-i stable await EGD.  # DM-blood glucose this morning 240-stable.   # I offered to call the patient's family to give an update on clinical status; patient declines.    # DISPOSITION: # HOLD CHEMO this week # 2 units PRBC today.  # follow up in 2 weeks- MD/labs- cbc/cmp/HOLD tube-carbo-etop [days-1-2-3]- Dr.B  Addendum: Discussed with Dr. Vicente Males; kindly agrees to move the endoscopies sooner 02/02.  Patient informed.

## 2019-03-24 NOTE — Progress Notes (Signed)
1120am: spoke with Dr. Rogue Bussing regarding the volume of blood in the patients first unit, 478mls, asked if he still wanted the pt to get two units, MD said yes and he would order lasix.

## 2019-03-25 ENCOUNTER — Ambulatory Visit
Admission: RE | Admit: 2019-03-25 | Discharge: 2019-03-25 | Disposition: A | Discharge status: Home / Self Care | Payer: BC Managed Care – PPO | Source: Ambulatory Visit | Attending: Radiation Oncology | Admitting: Radiation Oncology

## 2019-03-25 ENCOUNTER — Other Ambulatory Visit: Payer: Self-pay

## 2019-03-25 ENCOUNTER — Other Ambulatory Visit
Admission: RE | Admit: 2019-03-25 | Discharge: 2019-03-25 | Disposition: A | Discharge status: Home / Self Care | Payer: BC Managed Care – PPO | Source: Ambulatory Visit | Attending: Gastroenterology | Admitting: Gastroenterology

## 2019-03-25 ENCOUNTER — Inpatient Hospital Stay: Payer: BC Managed Care – PPO

## 2019-03-25 DIAGNOSIS — C781 Secondary malignant neoplasm of mediastinum: Secondary | ICD-10-CM | POA: Diagnosis not present

## 2019-03-25 DIAGNOSIS — Z20828 Contact with and (suspected) exposure to other viral communicable diseases: Secondary | ICD-10-CM | POA: Insufficient documentation

## 2019-03-25 DIAGNOSIS — Z01812 Encounter for preprocedural laboratory examination: Secondary | ICD-10-CM | POA: Insufficient documentation

## 2019-03-25 LAB — TYPE AND SCREEN
ABO/RH(D): A POS
Antibody Screen: NEGATIVE
Unit division: 0
Unit division: 0

## 2019-03-25 LAB — BPAM RBC
Blood Product Expiration Date: 202010132359
Blood Product Expiration Date: 202010162359
ISSUE DATE / TIME: 202009291103
ISSUE DATE / TIME: 202009291329
Unit Type and Rh: 600
Unit Type and Rh: 9500

## 2019-03-26 ENCOUNTER — Other Ambulatory Visit: Payer: Self-pay

## 2019-03-26 ENCOUNTER — Inpatient Hospital Stay: Payer: BC Managed Care – PPO

## 2019-03-26 ENCOUNTER — Ambulatory Visit
Admission: RE | Admit: 2019-03-26 | Discharge: 2019-03-26 | Disposition: A | Discharge status: Home / Self Care | Payer: BC Managed Care – PPO | Source: Ambulatory Visit | Attending: Radiation Oncology | Admitting: Radiation Oncology

## 2019-03-26 DIAGNOSIS — Z510 Encounter for antineoplastic radiation therapy: Secondary | ICD-10-CM | POA: Insufficient documentation

## 2019-03-26 DIAGNOSIS — C801 Malignant (primary) neoplasm, unspecified: Secondary | ICD-10-CM | POA: Insufficient documentation

## 2019-03-26 DIAGNOSIS — C781 Secondary malignant neoplasm of mediastinum: Secondary | ICD-10-CM | POA: Insufficient documentation

## 2019-03-26 LAB — SARS CORONAVIRUS 2 (TAT 6-24 HRS): SARS Coronavirus 2: NEGATIVE

## 2019-03-27 ENCOUNTER — Ambulatory Visit: Payer: BC Managed Care – PPO

## 2019-03-27 ENCOUNTER — Other Ambulatory Visit: Payer: BC Managed Care – PPO

## 2019-03-27 ENCOUNTER — Ambulatory Visit
Admission: RE | Admit: 2019-03-27 | Discharge: 2019-03-27 | Disposition: A | Discharge status: Home / Self Care | Payer: BC Managed Care – PPO | Attending: Gastroenterology | Admitting: Gastroenterology

## 2019-03-27 ENCOUNTER — Other Ambulatory Visit: Payer: Self-pay

## 2019-03-27 ENCOUNTER — Ambulatory Visit: Payer: BC Managed Care – PPO | Admitting: Anesthesiology

## 2019-03-27 ENCOUNTER — Encounter: Payer: Self-pay | Admitting: *Deleted

## 2019-03-27 ENCOUNTER — Encounter
Admission: RE | Disposition: A | Discharge status: Home / Self Care | Payer: Self-pay | Source: Home / Self Care | Attending: Gastroenterology

## 2019-03-27 DIAGNOSIS — K6389 Other specified diseases of intestine: Secondary | ICD-10-CM | POA: Diagnosis not present

## 2019-03-27 DIAGNOSIS — C180 Malignant neoplasm of cecum: Secondary | ICD-10-CM | POA: Diagnosis not present

## 2019-03-27 DIAGNOSIS — F17210 Nicotine dependence, cigarettes, uncomplicated: Secondary | ICD-10-CM | POA: Diagnosis not present

## 2019-03-27 DIAGNOSIS — K621 Rectal polyp: Secondary | ICD-10-CM | POA: Diagnosis not present

## 2019-03-27 DIAGNOSIS — F329 Major depressive disorder, single episode, unspecified: Secondary | ICD-10-CM | POA: Insufficient documentation

## 2019-03-27 DIAGNOSIS — K921 Melena: Secondary | ICD-10-CM | POA: Diagnosis not present

## 2019-03-27 DIAGNOSIS — E1140 Type 2 diabetes mellitus with diabetic neuropathy, unspecified: Secondary | ICD-10-CM | POA: Insufficient documentation

## 2019-03-27 DIAGNOSIS — Z7951 Long term (current) use of inhaled steroids: Secondary | ICD-10-CM | POA: Insufficient documentation

## 2019-03-27 DIAGNOSIS — J45909 Unspecified asthma, uncomplicated: Secondary | ICD-10-CM | POA: Insufficient documentation

## 2019-03-27 DIAGNOSIS — K5900 Constipation, unspecified: Secondary | ICD-10-CM

## 2019-03-27 DIAGNOSIS — K635 Polyp of colon: Secondary | ICD-10-CM | POA: Diagnosis not present

## 2019-03-27 DIAGNOSIS — Z7984 Long term (current) use of oral hypoglycemic drugs: Secondary | ICD-10-CM | POA: Diagnosis not present

## 2019-03-27 DIAGNOSIS — I10 Essential (primary) hypertension: Secondary | ICD-10-CM | POA: Diagnosis not present

## 2019-03-27 HISTORY — PX: COLONOSCOPY WITH PROPOFOL: SHX5780

## 2019-03-27 HISTORY — PX: ESOPHAGOGASTRODUODENOSCOPY (EGD) WITH PROPOFOL: SHX5813

## 2019-03-27 LAB — GLUCOSE, CAPILLARY: Glucose-Capillary: 246 mg/dL — ABNORMAL HIGH (ref 70–99)

## 2019-03-27 SURGERY — COLONOSCOPY WITH PROPOFOL
Anesthesia: General

## 2019-03-27 MED ORDER — PROPOFOL 10 MG/ML IV BOLUS
INTRAVENOUS | Status: DC | PRN
  Administered 2019-03-27 (×2): 50 mg via INTRAVENOUS

## 2019-03-27 MED ORDER — SODIUM CHLORIDE 0.9 % IV SOLN
INTRAVENOUS | Status: DC
  Administered 2019-03-27: 10:00:00 via INTRAVENOUS

## 2019-03-27 MED ORDER — PROPOFOL 500 MG/50ML IV EMUL
INTRAVENOUS | Status: DC | PRN
  Administered 2019-03-27: 125 ug/kg/min via INTRAVENOUS

## 2019-03-27 MED ORDER — PROPOFOL 500 MG/50ML IV EMUL
INTRAVENOUS | Status: AC
  Filled 2019-03-27: qty 50

## 2019-03-27 MED ORDER — BUTAMBEN-TETRACAINE-BENZOCAINE 2-2-14 % EX AERO
INHALATION_SPRAY | CUTANEOUS | Status: AC
  Filled 2019-03-27: qty 5

## 2019-03-27 NOTE — Transfer of Care (Signed)
Immediate Anesthesia Transfer of Care Note  Patient: Kevin Mckay  Procedure(s) Performed: COLONOSCOPY WITH PROPOFOL (N/A ) ESOPHAGOGASTRODUODENOSCOPY (EGD) WITH PROPOFOL (N/A )  Patient Location: PACU and Endoscopy Unit  Anesthesia Type:General  Level of Consciousness: awake, alert  and oriented  Airway & Oxygen Therapy: Patient Spontanous Breathing and Patient connected to nasal cannula oxygen  Post-op Assessment: Report given to RN and Post -op Vital signs reviewed and stable  Post vital signs: Reviewed and stable  Last Vitals:  Vitals Value Taken Time  BP    Temp    Pulse    Resp    SpO2      Last Pain:  Vitals:   03/27/19 0916  TempSrc: Tympanic  PainSc: 0-No pain         Complications: No apparent anesthesia complications

## 2019-03-27 NOTE — Anesthesia Post-op Follow-up Note (Signed)
Anesthesia QCDR form completed.        

## 2019-03-27 NOTE — Anesthesia Preprocedure Evaluation (Signed)
Anesthesia Evaluation  Patient identified by MRN, date of birth, ID band Patient awake    Reviewed: Allergy & Precautions, NPO status , Patient's Chart, lab work & pertinent test results  History of Anesthesia Complications Negative for: history of anesthetic complications  Airway Mallampati: II  TM Distance: >3 FB Neck ROM: Full    Dental  (+) Poor Dentition   Pulmonary asthma , Current SmokerPatient did not abstain from smoking.,    breath sounds clear to auscultation- rhonchi (-) wheezing      Cardiovascular hypertension, Pt. on medications (-) CAD, (-) Past MI, (-) Cardiac Stents and (-) CABG  Rhythm:Regular Rate:Normal - Systolic murmurs and - Diastolic murmurs    Neuro/Psych neg Seizures PSYCHIATRIC DISORDERS Depression negative neurological ROS     GI/Hepatic negative GI ROS, Neg liver ROS,   Endo/Other  diabetes, Oral Hypoglycemic Agents  Renal/GU negative Renal ROS     Musculoskeletal negative musculoskeletal ROS (+)   Abdominal (+) - obese,   Peds  Hematology negative hematology ROS (+)   Anesthesia Other Findings Past Medical History: No date: Asthma No date: Cerebral aneurysm No date: Chronic painful diabetic neuropathy ([facility]) No date: Depression No date: Diabetes mellitus without complication ([facility]) No date: Essential hypertension No date: History of kidney stones No date: Occasional tremors No date: Tobacco use   Reproductive/Obstetrics                             Anesthesia Physical Anesthesia Plan  ASA: III  Anesthesia Plan: General   Post-op Pain Management:    Induction: Intravenous  PONV Risk Score and Plan: 0 and Propofol infusion  Airway Management Planned: Natural Airway  Additional Equipment:   Intra-op Plan:   Post-operative Plan:   Informed Consent: I have reviewed the patients History and Physical, chart, labs and discussed the procedure  including the risks, benefits and alternatives for the proposed anesthesia with the patient or authorized representative who has indicated his/her understanding and acceptance.     Dental advisory given  Plan Discussed with: CRNA and Anesthesiologist  Anesthesia Plan Comments:         Anesthesia Quick Evaluation

## 2019-03-27 NOTE — Op Note (Signed)
[facility] Gastroenterology Patient Name: Kevin Mckay Procedure Date: 03/27/2019 9:51 AM MRN: [id number] Account #: [id number] Date of Birth: 1950/09/08 Admit Type: Outpatient Age: 68 Room: [facility] ENDO ROOM 4 Gender: Male Note Status: Finalized Procedure:            Colonoscopy Indications:          Melena Providers:            Jonathon Bellows MD, MD Referring MD:         Laneta Simmers (Referring MD) Medicines:            Monitored Anesthesia Care Complications:        No immediate complications. Procedure:            Pre-Anesthesia Assessment:                       - Prior to the procedure, a History and Physical was                        performed, and patient medications, allergies and                        sensitivities were reviewed. The patient's tolerance of                        previous anesthesia was reviewed.                       - The risks and benefits of the procedure and the                        sedation options and risks were discussed with the                        patient. All questions were answered and informed                        consent was obtained.                       - ASA Grade Assessment: III - A patient with severe                        systemic disease.                       After obtaining informed consent, the colonoscope was                        passed under direct vision. Throughout the procedure,                        the patient's blood pressure, pulse, and oxygen                        saturations were monitored continuously. The                        Colonoscope was introduced through the anus and                        advanced to the the  cecum, identified by the                        appendiceal orifice. The colonoscopy was performed                        without difficulty. The patient tolerated the procedure                        well. The quality of the bowel preparation was poor. Findings:  The perianal and digital rectal examinations were normal.      An ulcerated non-obstructing large mass was found in the cecum. The mass       was partially circumferential (involving one-half of the lumen       circumference). No bleeding was present. This was biopsied with a cold       forceps for histology.      Three sessile polyps were found in the rectum and ascending colon. The       polyps were large in size. Polypectomy was not attempted due to poor       endoscopic visualization.      The exam was otherwise without abnormality. Impression:           - Preparation of the colon was poor.                       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 Biopsied.                       - Three large polyps in the rectum and in the ascending                        colon. Resection not attempted.                       - The examination was otherwise normal. Recommendation:       - Discharge patient to home (with escort).                       - Resume previous diet.                       - Continue present medications.                       - Await pathology results.                       - lesion in cecum likely a neoplasm, i didnt attempt to                        resect 3 other medium to large size polyps as the colon                        was not clear. If his prognosis warrants excision of                        these polyps likely pre cancerous then would ned repeat                        colonoscopy with  2 day prep Procedure Code(s):    --- Professional ---                       [phone], Colonoscopy, flexible; with biopsy, single or                        multiple Diagnosis Code(s):    --- Professional ---                       D49.0, Neoplasm of unspecified behavior of digestive                        system                       K62.1, Rectal polyp                       K63.5, Polyp of colon                       K92.1, Melena (includes Hematochezia) CPT copyright 2019 American  Medical Association. All rights reserved. The codes documented in this report are preliminary and upon coder review may  be revised to meet current compliance requirements. Jonathon Bellows, MD Jonathon Bellows MD, MD 03/27/2019 10:37:16 AM This report has been signed electronically. Number of Addenda: 0 Note Initiated On: 03/27/2019 9:51 AM Scope Withdrawal Time: 0 hours 9 minutes 42 seconds  Total Procedure Duration: 0 hours 15 minutes 40 seconds  Estimated Blood Loss: Estimated blood loss: none.      [facility]

## 2019-03-27 NOTE — Op Note (Signed)
[facility] Gastroenterology Patient Name: Kevin Mckay Procedure Date: 03/27/2019 9:52 AM MRN: [id number] Account #: [id number] Date of Birth: [birth year] Admit Type: Outpatient Age: 68 Room: [facility] ENDO ROOM 4 Gender: Male Note Status: Finalized Procedure:            Upper GI endoscopy Indications:          Melena Providers:            Jonathon Bellows MD, MD Referring MD:         Laneta Simmers (Referring MD) Medicines:            Monitored Anesthesia Care Complications:        No immediate complications. Procedure:            Pre-Anesthesia Assessment:                       - ASA Grade Assessment: III - A patient with severe                        systemic disease.                       After obtaining informed consent, the endoscope was                        passed under direct vision. Throughout the procedure,                        the patient's blood pressure, pulse, and oxygen                        saturations were monitored continuously. The Endoscope                        was introduced through the mouth, and advanced to the                        third part of duodenum. The upper GI endoscopy was                        accomplished with ease. The patient tolerated the                        procedure well. Findings:      The examined duodenum was normal.      The esophagus was normal.      [Extent] mild mucosal changes characterized by thickened fold with       normal overlyiong mucosa were found in the cardia.      The exam was otherwise without abnormality. Impression:           - Normal examined duodenum.                       - Normal esophagus.                       - Mucosal changes in the cardia.                       - The examination was otherwise normal.                       -  No specimens collected. Recommendation:       - Perform a colonoscopy today.                       - if warranted can consider EUS to evaluate thick fold                   seen in the cardia Procedure Code(s):    --- Professional ---                       [phone], Esophagogastroduodenoscopy, flexible, transoral;                        diagnostic, including collection of specimen(s) by                        brushing or washing, when performed (separate procedure) Diagnosis Code(s):    --- Professional ---                       K31.89, Other diseases of stomach and duodenum                       K92.1, Melena (includes Hematochezia) CPT copyright 2019 American Medical Association. All rights reserved. The codes documented in this report are preliminary and upon coder review may  be revised to meet current compliance requirements. Jonathon Bellows, MD Jonathon Bellows MD, MD 03/27/2019 10:15:24 AM This report has been signed electronically. Number of Addenda: 0 Note Initiated On: 03/27/2019 9:52 AM Estimated Blood Loss: Estimated blood loss: none.      [facility]

## 2019-03-27 NOTE — Anesthesia Postprocedure Evaluation (Signed)
Anesthesia Post Note  Patient: Kevin Mckay  Procedure(s) Performed: COLONOSCOPY WITH PROPOFOL (N/A ) ESOPHAGOGASTRODUODENOSCOPY (EGD) WITH PROPOFOL (N/A )  Patient location during evaluation: Endoscopy Anesthesia Type: General Level of consciousness: awake and alert and oriented Pain management: pain level controlled Vital Signs Assessment: post-procedure vital signs reviewed and stable Respiratory status: spontaneous breathing, nonlabored ventilation and respiratory function stable Cardiovascular status: blood pressure returned to baseline and stable Postop Assessment: no signs of nausea or vomiting Anesthetic complications: no     Last Vitals:  Vitals:   03/27/19 1056 03/27/19 1106  BP: (!) 141/71 (!) 159/76  Pulse: 63 60  Resp: 19 19  Temp:    SpO2: 97% 98%    Last Pain:  Vitals:   03/27/19 1046  TempSrc:   PainSc: 0-No pain                 Jakeim Sedore

## 2019-03-27 NOTE — H&P (Signed)
Jonathon Bellows, MD [address], [place], [place], [place], [address] [address], [place], [place], [place], [address] Phone: [phone]  Fax: [phone]  Primary Care Physician:  Laneta Simmers, NP   Pre-Procedure History & Physical: HPI:  Kevin Mckay is a 68 y.o. male is here for an endoscopy and colonoscopy    Past Medical History:  Diagnosis Date  . Asthma   . Cerebral aneurysm   . Chronic painful diabetic neuropathy ([place])   . Depression   . Diabetes mellitus without complication ([place])   . Essential hypertension   . History of kidney stones   . Occasional tremors   . Tobacco use     Past Surgical History:  Procedure Laterality Date  . ABDOMINAL SURGERY     age 47. trauma surgery due to forklift injury- liver and spleen  . IR GENERIC HISTORICAL  05/31/2016   IR ANGIO VERTEBRAL SEL VERTEBRAL BILAT MOD SED 05/31/2016 Consuella Lose, MD [facility]  . IR GENERIC HISTORICAL  05/31/2016   IR ANGIO INTRA EXTRACRAN SEL INTERNAL CAROTID BILAT MOD SED 05/31/2016 Consuella Lose, MD [facility]  . PORTACATH PLACEMENT Right 02/04/2019   Procedure: INSERTION PORT-A-CATH;  Surgeon: [name], MD;  Location: [facility] ORS;  Service: General;  Laterality: Right;  . THORACOTOMY Left 01/22/2019   Procedure: PRE OP BRONCH LEFT ANTERIOR THORACOTOMY WITH BIOPSY OF MEDIASTINAL MASS;  Surgeon: [name], MD;  Location: [facility] ORS;  Service: General;  Laterality: Left;    Prior to Admission medications   Medication Sig Start Date End Date Taking? Authorizing Provider  Budesonide-Formoterol Fumarate (SYMBICORT IN) Inhale into the lungs 2 (two) times daily.    Yes [provider]  DULoxetine (CYMBALTA) 30 MG capsule Take 30 mg by mouth daily.   Yes [provider]  glipiZIDE (GLUCOTROL XL) 10 MG 24 hr tablet Take 10 mg by mouth daily with breakfast.   Yes [provider]  hydrochlorothiazide (HYDRODIURIL) 25 MG tablet Take 1 tablet by mouth  daily. 01/06/19  Yes [provider]  lidocaine-prilocaine (EMLA) cream Apply 1 application topically as needed. 30-45 mins prior to port access. 01/29/19  Yes Cammie Sickle, MD  lovastatin (MEVACOR) 20 MG tablet Take 20 mg by mouth every evening.   Yes [provider]  metFORMIN (GLUCOPHAGE) 1000 MG tablet Take 1,000 mg by mouth 2 (two) times daily with a meal.   Yes [provider]  omeprazole (PRILOSEC) 40 MG capsule Take 1 capsule (40 mg total) by mouth daily. 03/19/19  Yes Jonathon Bellows, MD  ondansetron (ZOFRAN) 8 MG tablet Take 1 tablet (8 mg total) by mouth every 8 (eight) hours as needed for nausea or vomiting (start 3 days; after chemo). 01/29/19  Yes Cammie Sickle, MD  amLODipine (NORVASC) 10 MG tablet Take 10 mg by mouth daily.    [provider]  ferrous sulfate 324 (65 Fe) MG TBEC Take 2 tablets by mouth daily.    [provider]  gabapentin (NEURONTIN) 600 MG tablet Take 600 mg by mouth every 6 (six) hours as needed (pain. Not to exceed 3600 mg).     [provider]  linaCLOtide (LINZESS PO) Take 1 capsule by mouth daily.    [provider]  Multiple Vitamins-Minerals (MULTIVITAMIN WITH MINERALS) tablet Take 1 tablet by mouth daily.    [provider]  oxyCODONE-acetaminophen (PERCOCET/ROXICET) 5-325 MG tablet Take 1-2 tablets by mouth every 4 (four) hours as needed for moderate pain. Patient  not taking: Reported on 03/27/2019 01/24/19   [name], MD  prazosin (MINIPRESS) 1 MG capsule Take 1 mg by mouth at bedtime.    [provider]  prochlorperazine (COMPAZINE) 10 MG tablet Take 1 tablet (10 mg total) by mouth every 6 (six) hours as needed for nausea or vomiting. Patient not taking: Reported on 03/27/2019 01/29/19   Cammie Sickle, MD  psyllium (METAMUCIL) 58.6 % powder Take 1 packet by mouth daily.    [provider]  sitaGLIPtin (JANUVIA) 25 MG tablet Take 25 mg by mouth daily.     [provider]  sucralfate (CARAFATE) 1 GM/10ML suspension Take 65ml three times daily  30 minutes before meals 03/04/19   Cammie Sickle, MD  traMADol (ULTRAM) 50 MG tablet Take 1 tablet (50 mg total) by mouth every 8 (eight) hours as needed. 03/03/19   Cammie Sickle, MD  vitamin B-12 (CYANOCOBALAMIN) 100 MCG tablet Take 100 mcg by mouth daily.    [provider]    Allergies as of 03/20/2019 - Review Complete 03/19/2019  Allergen Reaction Noted  . Amoxicillin Hives 03/31/2015  . Codeine Nausea Only 03/31/2015    Family History  Problem Relation Age of Onset  . Lymphoma Father   . Liver cancer Paternal Uncle   . Lung cancer Paternal Uncle   . Lung cancer Maternal Uncle     Social History   Socioeconomic History  . Marital status: Married    Spouse name: Not on file  . Number of children: Not on file  . Years of education: Not on file  . Highest education level: Not on file  Occupational History  . Not on file  Social Needs  . Financial resource strain: Not on file  . Food insecurity    Worry: Not on file    Inability: Not on file  . Transportation needs    Medical: Not on file    Non-medical: Not on file  Tobacco Use  . Smoking status: Current Every Day Smoker    Packs/day: 0.50    Years: 50.00    Pack years: 25.00    Types: Cigarettes  . Smokeless tobacco: Never Used  Substance and Sexual Activity  . Alcohol use: No  . Drug use: Yes    Types: Barbituates, Marijuana  . Sexual activity: Not on file  Lifestyle  . Physical activity    Days per week: Not on file    Minutes per session: Not on file  . Stress: Not on file  Relationships  . Social Herbalist on phone: Not on file    Gets together: Not on file    Attends religious service: Not on file    Active member of club or organization: Not on file    Attends meetings of clubs or organizations: Not on file    Relationship status: Not on file  . Intimate partner  violence    Fear of current or ex partner: Not on file    Emotionally abused: Not on file    Physically abused: Not on file    Forced sexual activity: Not on file  Other Topics Concern  . Not on file  Social History Narrative   Lives in [place]; wife/ son/grandson [custody]; smoker; vending business; hx of alcoholism- quit at 76.     Review of Systems: See HPI, otherwise negative ROS  Physical Exam: BP (!) 155/65   Pulse 68   Temp (!) 96.5 F (35.8 C) (  Tympanic)   Resp 20   Ht 5\' 8"  (1.727 m)   Wt 66.7 kg   SpO2 100%   BMI 22.35 kg/m  General:   Alert,  pleasant and cooperative in NAD Head:  Normocephalic and atraumatic. Neck:  Supple; no masses or thyromegaly. Lungs:  Clear throughout to auscultation, normal respiratory effort.    Heart:  +S1, +S2, Regular rate and rhythm, No edema. Abdomen:  Soft, nontender and nondistended. Normal bowel sounds, without guarding, and without rebound.   Neurologic:  Alert and  oriented x4;  grossly normal neurologically.  Impression/Plan: Kevin Mckay is here for an endoscopy and colonoscopy  to be performed for  evaluation of melena     Risks, benefits, limitations, and alternatives regarding endoscopy have been reviewed with the patient.  Questions have been answered.  All parties agreeable.   Jonathon Bellows, MD  03/27/2019, 9:51 AM

## 2019-03-29 ENCOUNTER — Telehealth: Payer: Self-pay | Admitting: Internal Medicine

## 2019-03-29 NOTE — Telephone Encounter (Signed)
Spoke to patient regarding results of the colonoscopy; cecal mass concerning for malignancy.  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  Patient verbalized the understanding of the difficult situation.  Patient will keep appointment with me as planned.   Drue Dun- Based on PCP's preference; please refer to Surgery ASAP/ and I can talk to surgeon directly.

## 2019-03-30 ENCOUNTER — Encounter: Payer: Self-pay | Admitting: Gastroenterology

## 2019-03-30 ENCOUNTER — Other Ambulatory Visit: Payer: Self-pay | Admitting: Anatomic Pathology & Clinical Pathology

## 2019-03-30 ENCOUNTER — Telehealth: Payer: Self-pay | Admitting: Internal Medicine

## 2019-03-30 ENCOUNTER — Ambulatory Visit (INDEPENDENT_AMBULATORY_CARE_PROVIDER_SITE_OTHER): Payer: BC Managed Care – PPO | Admitting: Gastroenterology

## 2019-03-30 ENCOUNTER — Other Ambulatory Visit: Payer: Self-pay

## 2019-03-30 ENCOUNTER — Telehealth: Payer: Self-pay

## 2019-03-30 ENCOUNTER — Ambulatory Visit: Payer: BC Managed Care – PPO

## 2019-03-30 DIAGNOSIS — C781 Secondary malignant neoplasm of mediastinum: Secondary | ICD-10-CM

## 2019-03-30 DIAGNOSIS — K6389 Other specified diseases of intestine: Secondary | ICD-10-CM

## 2019-03-30 DIAGNOSIS — D649 Anemia, unspecified: Secondary | ICD-10-CM

## 2019-03-30 DIAGNOSIS — C189 Malignant neoplasm of colon, unspecified: Secondary | ICD-10-CM

## 2019-03-30 DIAGNOSIS — K921 Melena: Secondary | ICD-10-CM

## 2019-03-30 DIAGNOSIS — C801 Malignant (primary) neoplasm, unspecified: Secondary | ICD-10-CM

## 2019-03-30 LAB — SURGICAL PATHOLOGY

## 2019-03-30 NOTE — Telephone Encounter (Signed)
I have heard back from [facility] office. She recommended that [facility] may be more convenient for him. They do have one colorectal surgeon that sees patients there. They would however, have to perform surgery at [facility]. I have called Kevin Mckay back with this information. He prefers to be seen more local as he lives in [place]. He requested Kevin Mckay. 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 He would like to see one of them. Referral will be sent to [facility]. I have spoke with Kevin Mckay's office to give them update on place of referral.

## 2019-03-30 NOTE — Telephone Encounter (Signed)
PCP, Hansel Starling NP, provides services at [facility]. I have contacted her office regarding preference for surgeon. Awaiting callback.

## 2019-03-30 NOTE — Telephone Encounter (Signed)
Spoke to patient regarding colonoscopy/pathology-cecal mass suggestive of colon cancer.  Would recommend proceeding with surgery given the patient's significant anemia; will hold off carbo etoposide [2 more cycles] after surgery.  Patient awaiting evaluation with surgery tomorrow.

## 2019-03-30 NOTE — Progress Notes (Signed)
Jonathon Bellows , MD [address]  [place]  [place], [place] [address]  Main: [phone]  Fax: [phone]   Primary Care Physician: Laneta Simmers, NP  Virtual Visit via Telephone Note  I connected with patient on 03/30/19 at  4:00 PM EDT by telephone and verified that I am speaking with the correct person using two identifiers.   I discussed the limitations, risks, security and privacy concerns of performing an evaluation and management service by telephone and the availability of in person appointments. I also discussed with the patient that there may be a patient responsible charge related to this service. The patient expressed understanding and agreed to proceed.  Location of Patient: Home Location of Provider: Home Persons involved: Patient and provider only   History of Present Illness: Chief Complaint  Patient presents with  . Follow Up Melena    Pt would like to know if the bleed was coming from the colon.    HPI: Kevin Mckay is a 68 y.o. male recently underwent an EGD and colonoscopy to evaluate for iron deficiency anemia.  He has a history of metastatic lung cancer.  I performed the EGD on 03/27/2019: I noticed a thickened fold in the cardia of the stomach.  Normal overlying mucosa.  No biopsies were taken.  Rest of the examination was normal.  I a.  lso performed a colonoscopy on the same day.  A large circumferential mass was seen in the cecum ulcerated nonobstructing no active bleeding noted.  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  In addition there were 3 sessile polyps found in the rectum and ascending colon the polyps were large in size polypectomy was not attempted due to poor endoscopy visualization due to a poor bowel prep.  The patient states that he has not had a bowel movement since the last colonoscopy.  He denies any rectal bleeding or dark tarry stools at this point of time  Current Outpatient Medications  Medication Sig  Dispense Refill  . amLODipine (NORVASC) 10 MG tablet Take 10 mg by mouth daily.    . Budesonide-Formoterol Fumarate (SYMBICORT IN) Inhale into the lungs 2 (two) times daily.     . DULoxetine (CYMBALTA) 30 MG capsule Take 30 mg by mouth daily.    . ferrous sulfate 324 (65 Fe) MG TBEC Take 2 tablets by mouth daily.    Marland Kitchen gabapentin (NEURONTIN) 600 MG tablet Take 600 mg by mouth every 6 (six) hours as needed (pain. Not to exceed 3600 mg).     Marland Kitchen glipiZIDE (GLUCOTROL XL) 10 MG 24 hr tablet Take 10 mg by mouth daily with breakfast.    . hydrochlorothiazide (HYDRODIURIL) 25 MG tablet Take 1 tablet by mouth daily.    Marland Kitchen lidocaine-prilocaine (EMLA) cream Apply 1 application topically as needed. 30-45 mins prior to port access. 30 g 0  . linaCLOtide (LINZESS PO) Take 1 capsule by mouth daily.    Marland Kitchen lovastatin (MEVACOR) 20 MG tablet Take 20 mg by mouth every evening.    . metFORMIN (GLUCOPHAGE) 1000 MG tablet Take 1,000 mg by mouth 2 (two) times daily with a meal.    . Multiple Vitamins-Minerals (MULTIVITAMIN WITH MINERALS) tablet Take 1 tablet by mouth daily.    Marland Kitchen omeprazole (PRILOSEC) 40 MG capsule Take 1 capsule (40 mg total) by mouth daily. 90 capsule 1  . ondansetron (ZOFRAN) 8 MG tablet Take 1 tablet (8 mg total) by mouth every 8 (eight) hours as needed for nausea or vomiting (  start 3 days; after chemo). 40 tablet 1  . prazosin (MINIPRESS) 1 MG capsule Take 1 mg by mouth at bedtime.    . psyllium (METAMUCIL) 58.6 % powder Take 1 packet by mouth daily.    . sitaGLIPtin (JANUVIA) 25 MG tablet Take 25 mg by mouth daily.    . sucralfate (CARAFATE) 1 GM/10ML suspension Take 77ml three times daily  30 minutes before meals 420 mL 3  . traMADol (ULTRAM) 50 MG tablet Take 1 tablet (50 mg total) by mouth every 8 (eight) hours as needed. 60 tablet 0  . vitamin B-12 (CYANOCOBALAMIN) 100 MCG tablet Take 100 mcg by mouth daily.    Marland Kitchen oxyCODONE-acetaminophen (PERCOCET/ROXICET) 5-325 MG tablet Take 1-2 tablets by mouth  every 4 (four) hours as needed for moderate pain. (Patient not taking: Reported on 03/30/2019) 30 tablet 0  . prochlorperazine (COMPAZINE) 10 MG tablet Take 1 tablet (10 mg total) by mouth every 6 (six) hours as needed for nausea or vomiting. (Patient not taking: Reported on 03/30/2019) 40 tablet 1   No current facility-administered medications for this visit.     Allergies as of 03/30/2019 - Review Complete 03/30/2019  Allergen Reaction Noted  . Ferumoxytol  03/24/2019  . Amoxicillin Hives 03/31/2015  . Codeine Nausea Only 03/31/2015    Review of Systems:    All systems reviewed and negative except where noted in HPI.   Observations/Objective:  Labs: CMP     Component Value Date/Time   NA 134 (L) 03/24/2019 0828   K 3.4 (L) 03/24/2019 0828   CL 98 03/24/2019 0828   CO2 27 03/24/2019 0828   GLUCOSE 240 (H) 03/24/2019 0828   BUN 9 03/24/2019 0828   CREATININE 0.87 03/24/2019 0828   CALCIUM 8.2 (L) 03/24/2019 0828   PROT 6.0 (L) 03/24/2019 0828   ALBUMIN 3.1 (L) 03/24/2019 0828   AST 13 (L) 03/24/2019 0828   ALT 14 03/24/2019 0828   ALKPHOS 60 03/24/2019 0828   BILITOT 0.5 03/24/2019 0828   GFRNONAA >60 03/24/2019 0828   GFRAA >60 03/24/2019 0828   Lab Results  Component Value Date   WBC 4.2 03/24/2019   HGB 6.6 (L) 03/24/2019   HCT 21.6 (L) 03/24/2019   MCV 79.4 (L) 03/24/2019   PLT 243 03/24/2019    Imaging Studies: No results found.  Assessment and Plan:   Kevin Mckay is a 68 y.o. y/o male recently underwent an EGD and colonoscopy to evaluate for iron deficiency anemia.  EGD showed a submucosal lesion in the cardia that was not bleeding.  In addition on colonoscopy noted a large mass in the cecum which endoscopically as well as on the biopsies have proven to be invasive adenocarcinoma.  There was no active bleeding at that time.  Since the procedure he is not having any overt bleeding.  There were 3 other polyps seen in the colon which were not resected as his  bowel prep was very poor.  Plan :   1.  The cecal lesion is most likely the cause of his anemia and intermittent black tarry stools.  The obvious solution to prevent this from bleeding once again would be to undergo surgery.  In the interim if he were to bleed actively evidenced by black tarry stools or bright red blood per rectum, I could perform a colonoscopy and sprayed a compound called hemospray  over the malignant lesion which has been shown in multiple studies to help reduce the risk of rebleeding from a few weeks  to probably a month or two.   2.  The other polyps seen in the colon can be resected based on the overall prognosis of his lung and colon cancer   I discussed the assessment and treatment plan with the patient. The patient was provided an opportunity to ask questions and all were answered. The patient agreed with the plan and demonstrated an understanding of the instructions.   The patient was advised to call back or seek an in-person evaluation if the symptoms worsen or if the condition fails to improve as anticipated.  I provided 15 minutes of non-face-to-face time during this encounter.  Dr Jonathon Bellows MD,MRCP [facility]) Gastroenterology/Hepatology Pager: [phone]   Speech recognition software was used to dictate this note.

## 2019-03-30 NOTE — Progress Notes (Signed)
dt ?

## 2019-03-31 ENCOUNTER — Encounter: Payer: Self-pay | Admitting: General Surgery

## 2019-03-31 ENCOUNTER — Ambulatory Visit (INDEPENDENT_AMBULATORY_CARE_PROVIDER_SITE_OTHER): Payer: BC Managed Care – PPO | Admitting: General Surgery

## 2019-03-31 ENCOUNTER — Other Ambulatory Visit: Payer: Self-pay | Admitting: Internal Medicine

## 2019-03-31 ENCOUNTER — Other Ambulatory Visit: Payer: Self-pay

## 2019-03-31 VITALS — BP 157/79 | HR 79 | Temp 97.5°F | Ht 68.0 in | Wt 145.0 lb

## 2019-03-31 DIAGNOSIS — C189 Malignant neoplasm of colon, unspecified: Secondary | ICD-10-CM | POA: Diagnosis not present

## 2019-03-31 DIAGNOSIS — K6389 Other specified diseases of intestine: Secondary | ICD-10-CM

## 2019-03-31 NOTE — Progress Notes (Signed)
Spoke to [facility]; recommend proceeding with surgery- given severe anemia. Will plan to finish chemo for small cell lung ca post colectomy.  GB

## 2019-03-31 NOTE — Progress Notes (Signed)
Patient ID: Kevin Mckay, male   DOB: 1951-05-13, 68 y.o.   MRN: [id number]  Chief Complaint  Patient presents with  . Colon Cancer    HPI Kevin Mckay is a 68 y.o. male.  He has been referred by Dr. Rogue Bussing for surgical evaluation of colon cancer identified on recent colonoscopy.  Kevin Mckay has been undergoing chemotherapy and radiation for metastatic small cell lung cancer.  He has had persistent issues with dropping hemoglobin.  His treatments have had to be deferred occasionally, secondary to low blood counts.  He has had melenic stools.  He recently underwent colonoscopy with Dr. Jonathon Bellows to try and better evaluate the source of blood loss.  A cecal mass was identified.  Biopsy demonstrated invasive adenocarcinoma.  He has had a recent chest CT (June 2020), but no CT scan of the abdomen or pelvis.  He reports having black and green formed stools.  He endorses constipation, having bowel movements 1-2 times per week, but is on Linzess and Metamucil to try and help with his constipation.  He reports having GERD and "lots of rumbling and grumbling" in his after abdomen.  He denies any pain in his abdomen but states that he does feel somewhat uncomfortable.  His appetite is reported as good.  There is no reported history of colon cancer in his family.  Of note, approximately 40 years ago he was involved in a forklift accident that resulted in a trauma laparotomy with resection of part of his liver and pancreas (notes state spleen).   Past Medical History:  Diagnosis Date  . Asthma   . Cancer ([place])    Metastatic small cell lung cancer  . Cerebral aneurysm   . Chronic painful diabetic neuropathy ([place])   . Depression   . Diabetes mellitus without complication ([place])   . Essential hypertension   . History of kidney stones   . Occasional tremors   . Tobacco use     Past Surgical History:  Procedure Laterality Date  . ABDOMINAL SURGERY     age 68. trauma surgery due to forklift  injury- liver and spleen  . COLONOSCOPY WITH PROPOFOL N/A 03/27/2019   Procedure: COLONOSCOPY WITH PROPOFOL;  Surgeon: Jonathon Bellows, MD;  Location: [facility] ENDOSCOPY;  Service: Gastroenterology;  Laterality: N/A;  . ESOPHAGOGASTRODUODENOSCOPY (EGD) WITH PROPOFOL N/A 03/27/2019   Procedure: ESOPHAGOGASTRODUODENOSCOPY (EGD) WITH PROPOFOL;  Surgeon: Jonathon Bellows, MD;  Location: [facility] ENDOSCOPY;  Service: Gastroenterology;  Laterality: N/A;  . IR GENERIC HISTORICAL  05/31/2016   IR ANGIO VERTEBRAL SEL VERTEBRAL BILAT MOD SED 05/31/2016 Consuella Lose, MD [facility]-INTERV RAD  . IR GENERIC HISTORICAL  05/31/2016   IR ANGIO INTRA EXTRACRAN SEL INTERNAL CAROTID BILAT MOD SED 05/31/2016 Consuella Lose, MD [facility]INTER[facility]  . PORTACATH PLACEMENT Right 02/04/2019   Procedure: INSERTION PORT-A-CATH;  Surgeon: [name], MD;  Location: [facility] ORS;  Service: General;  Laterality: Right;  . THORACOTOMY Left 01/22/2019   Procedure: PRE OP BRONCH LEFT ANTERIOR THORACOTOMY WITH BIOPSY OF MEDIASTINAL MASS;  Surgeon: [name], MD;  Location: [facility] ORS;  Service: General;  Laterality: Left;    Family History  Problem Relation Age of Onset  . Lymphoma Father   . Liver cancer Paternal Uncle   . Lung cancer Paternal Uncle   . Lung cancer Maternal Uncle     Social History Social History   Tobacco Use  . Smoking status: Current Every Day Smoker    Packs/day: 0.50    Years: 50.00    Pack  years: 25.00    Types: Cigarettes  . Smokeless tobacco: Never Used  Substance Use Topics  . Alcohol use: No  . Drug use: Yes    Types: Barbituates, Marijuana    Allergies  Allergen Reactions  . Ferumoxytol     Infusion reaction-shortness of breath/ tachycardia/hypotension.  Marland Kitchen Amoxicillin Hives  . Codeine Nausea Only    Current Outpatient Medications  Medication Sig Dispense Refill  . amLODipine (NORVASC) 10 MG tablet Take 10 mg by mouth daily.    . Budesonide-Formoterol Fumarate (SYMBICORT IN) Inhale into the lungs 2  (two) times daily.     . DULoxetine (CYMBALTA) 30 MG capsule Take 30 mg by mouth daily.    . ferrous sulfate 324 (65 Fe) MG TBEC Take 2 tablets by mouth daily.    Marland Kitchen gabapentin (NEURONTIN) 600 MG tablet Take 600 mg by mouth every 6 (six) hours as needed (pain. Not to exceed 3600 mg).     Marland Kitchen glipiZIDE (GLUCOTROL XL) 10 MG 24 hr tablet Take 10 mg by mouth daily with breakfast.    . hydrochlorothiazide (HYDRODIURIL) 25 MG tablet Take 1 tablet by mouth daily.    Marland Kitchen lidocaine-prilocaine (EMLA) cream Apply 1 application topically as needed. 30-45 mins prior to port access. 30 g 0  . linaCLOtide (LINZESS PO) Take 1 capsule by mouth daily.    Marland Kitchen lovastatin (MEVACOR) 20 MG tablet Take 20 mg by mouth every evening.    . metFORMIN (GLUCOPHAGE) 1000 MG tablet Take 1,000 mg by mouth 2 (two) times daily with a meal.    . Multiple Vitamins-Minerals (MULTIVITAMIN WITH MINERALS) tablet Take 1 tablet by mouth daily.    Marland Kitchen omeprazole (PRILOSEC) 40 MG capsule Take 1 capsule (40 mg total) by mouth daily. 90 capsule 1  . ondansetron (ZOFRAN) 8 MG tablet Take 1 tablet (8 mg total) by mouth every 8 (eight) hours as needed for nausea or vomiting (start 3 days; after chemo). 40 tablet 1  . prazosin (MINIPRESS) 1 MG capsule Take 1 mg by mouth at bedtime.    . prochlorperazine (COMPAZINE) 10 MG tablet Take 1 tablet (10 mg total) by mouth every 6 (six) hours as needed for nausea or vomiting. 40 tablet 1  . psyllium (METAMUCIL) 58.6 % powder Take 1 packet by mouth daily.    . sitaGLIPtin (JANUVIA) 25 MG tablet Take 25 mg by mouth daily.    . sucralfate (CARAFATE) 1 GM/10ML suspension Take 72ml three times daily  30 minutes before meals 420 mL 3  . traMADol (ULTRAM) 50 MG tablet Take 1 tablet (50 mg total) by mouth every 8 (eight) hours as needed. 60 tablet 0  . vitamin B-12 (CYANOCOBALAMIN) 100 MCG tablet Take 100 mcg by mouth daily.     No current facility-administered medications for this visit.     Review of Systems Review  of Systems  Constitutional: Positive for fatigue.  Respiratory: Positive for cough and shortness of breath.   Gastrointestinal: Positive for blood in stool and constipation.  Skin: Positive for color change.       Radiation related  Neurological:       Tingling  All other systems reviewed and are negative.   Blood pressure (!) 157/79, pulse 79, temperature (!) 97.5 F (36.4 C), temperature source Temporal, height 5\' 8"  (1.727 m), weight 145 lb (65.8 kg), SpO2 99 %. Body mass index is 22.05 kg/m.  Physical Exam Physical Exam Constitutional:      General: He is not in acute distress.  Appearance: He is normal weight.  HENT:     Head: Normocephalic and atraumatic.     Comments: Alopecia    Nose:     Comments: Covered with a mask secondary to COVID-19 precautions    Mouth/Throat:     Comments: Covered with a mask secondary to COVID-19 precautions Eyes:     General: No scleral icterus.       Right eye: No discharge.        Left eye: No discharge.     Conjunctiva/sclera: Conjunctivae normal.  Neck:     Musculoskeletal: Normal range of motion.  Cardiovascular:     Rate and Rhythm: Normal rate and regular rhythm.     Pulses: Normal pulses.     Heart sounds: Murmur present.     Comments: 2 out of 6 systolic murmur Pulmonary:     Effort: Pulmonary effort is normal. No respiratory distress.  Chest:       Comments: There are skin changes on the chest wall consistent with radiation therapy. Abdominal:     General: Abdomen is flat.     Palpations: Abdomen is soft. There is no mass.     Tenderness: There is no abdominal tenderness.     Comments: Borborygmi present  Genitourinary:    Comments: Deferred Musculoskeletal: Normal range of motion.        General: No swelling.     Comments: Extensive thenar wasting bilaterally.  Lymphadenopathy:     Cervical: No cervical adenopathy.  Skin:    General: Skin is warm and dry.  Neurological:     General: No focal deficit present.      Mental Status: He is alert.  Psychiatric:        Mood and Affect: Mood normal.        Behavior: Behavior normal.        Thought Content: Thought content normal.     Data Reviewed I reviewed both the report and the images of the patient's recent colonoscopy.   I also reviewed Dr. Aletha Halim notes regarding his treatment for small cell lung cancer.  I spoke directly with Dr. Rogue Bussing this morning about plans for completion of his treatment, confirming that this could be delayed until after surgical management of his colon cancer.    I reviewed the chest CT performed in June.  The visualized portions of the liver did not show any metastatic involvement.  No lung lesions were identified on that scan.  Recent complete blood counts were also reviewed.  On 24 March 2019, his hemoglobin was 6.6 with a corresponding hematocrit of 21.6.  This shows a steady drift downward over the preceding month; they were 9.0 and 29.6, respectively, on January 29, 2019.  Assessment This is a 68 year old man currently undergoing chemotherapy for metastatic small cell lung cancer.  He recently completed radiation therapy.  He has had issues with progressively dropping hematocrit as well as melenic stools.  The likely source of the bleeding is the cecal mass appreciated on recent colonoscopy.  This is been biopsy-proven to be invasive adenocarcinoma.  Plan I have recommended that Mr. Bostic undergo surgical resection of his colon cancer.  He will require a CT scan of the abdomen and pelvis to complete his staging.  I have ordered a CEA.  He may require cardiac and/or pulmonary clearance, secondary to pulmonary hypertension described on his chest CT.  He also has a 2 out of 6 systolic murmur.  I am also concerned with his nutritional status,  secondary to his low body mass index and thenar wasting.  I have ordered a prealbumin.  He may require some  "prehabilitation" as well as possible pretreatment with  nutritional supplements prior to undergoing surgery.  Once I have the results back from his CT scan, we will contact him regarding timing and scheduling of his operation.    Fredirick Maudlin 03/31/2019, 11:28 AM

## 2019-03-31 NOTE — Patient Instructions (Addendum)
We will schedule you for a CT of the abdomen and pelvis with contrast.   We have scheduled you for a CT Scan at [facility] located across from [place] in [place].  This is on 04/06/19 at 9:30am. You will need to arrive by 9:15am and have nothing to eat or drink 4 hours prior. You will need to pick up prep kit for this.   You will needs to get lab work done at [facility] sometime this week, go in through the [facility] entrance.   We will schedule your surgery once we get the results of the CT scan.   We have given you your surgery information as well as your bowel prep information. We will send in your prescriptions once we schedule surgery.    Open Colectomy An open colectomy is surgery to remove part or all of the large intestine (colon). This procedure may be used to treat several conditions, including:  Inflammation and infection of the colon (diverticulitis).  Tumors or masses in the colon.  Inflammatory bowel disease, such as Crohn disease or ulcerative colitis.  Bleeding from the colon.  Blockage or obstruction of the colon. Tell a health care provider about:  Any allergies you have.  All medicines you are taking, including vitamins, herbs, eye drops, creams, and over-the-counter medicines.  Any problems you or family members have had with anesthetic medicines.  Any blood disorders you have.  Any surgeries you have had.  Any medical conditions you have.  Whether you are pregnant or may be pregnant.  Whether you smoke or use tobacco products. These can affect your body's reaction to anesthesia. What are the risks? Generally, this is a safe procedure. However, problems may occur, including:  Infection.  Bleeding.  Allergic reactions to medicines.  Damage to other structures or organs.  Pneumonia.  The incision opening up.  Tissues from inside the abdomen bulging through the incision (hernia).  Reopening of the colon where it was stitched or  stapled together.  A blood clot forming in a vein and traveling to the lungs.  Future blockage of the small intestine from scar tissue. What happens before the procedure? Staying hydrated Follow instructions from your health care provider about hydration, which may include:  Up to 2 hours before the procedure - you may continue to drink clear liquids, such as water, clear fruit juice, black coffee, and plain tea. Eating and drinking restrictions Follow instructions from your health care provider about eating and drinking, which may include:  8 hours before the procedure - stop eating heavy meals or foods such as meat, fried foods, or fatty foods.  6 hours before the procedure - stop eating light meals or foods, such as toast or cereal.  6 hours before the procedure - stop drinking milk or drinks that contain milk.  2 hours before the procedure - stop drinking clear liquids. Bowel prep In some cases, you may be prescribed an oral bowel prep to clean out your colon. If so:  Take it as told by your health care provider. Starting the day before your procedure, you may need to drink a large amount of medicated liquid. The liquid will cause you to have multiple loose stools until your stool is almost clear or light green.  Follow instructions from your health care provider about eating and drinking restrictions during bowel prep. Medicines  Ask your health care provider about: ? Changing or stopping your regular medicines or vitamins. This is especially important if  you are taking diabetes medicines, blood thinners, or vitamin E. ? Taking medicines such as aspirin and ibuprofen. These medicines can thin your blood. Do not take these medicines before your procedure if your health care provider instructs you not to.  If you were prescribed an antibiotic medicine, take it as told by your health care provider. General instructions  Bring loose-fitting, comfortable clothing and slip-on shoes  that you can put on without bending over.  Make sure to see your health care provider for any tests that you need before the procedure, such as: ? Blood tests. ? A test to check the heart's rhythm (electrocardiogram, ECG). ? A CT scan of your abdomen. ? Urine tests. ? Colonoscopy.  Plan to have someone take you home from the hospital or clinic.  Arrange for someone to help you with your activities during your recovery. What happens during the procedure?  To reduce your risk of infection: ? Your health care team will wash or sanitize their hands. ? Your skin will be washed with soap. ? Hair may be removed from the surgical area.  An IV tube will be inserted into one of your veins. The tube will be used to give you medicines and fluids.  You will be given a medicine to make you fall asleep (general anesthetic). You may also be given a medicine to help you relax (sedative).  Small monitors will be connected to your body. They will be used to check your heart, blood pressure, and oxygen level.  A breathing tube may be placed into your lungs during the procedure.  A thin, flexible tube (catheter) will be placed into your bladder to drain urine.  A tube may be inserted through your nose and into your stomach (nasogastric tube, or NG tube). The tube is used to remove stomach fluids after surgery until the intestines start working again.  An incision will be made in your abdomen.  Clamps or staples will be put on your colon.  The part of the colon between the clamps or staples will be removed.  The ends of the colon that remain will be stitched or stapled together.  The incision in your abdomen will be closed with stitches (sutures) or staples.  The incision will be covered with a bandage (dressing).  A small opening (stoma) may be created in your lower abdomen. A removable, external pouch (ostomy pouch) will be attached to the stoma. This pouch will collect stool outside of your  body. Stool passes through the stoma and into the pouch instead of through your anus. The procedure may vary among health care providers and hospitals. What happens after the procedure?  Your blood pressure, heart rate, breathing rate, and blood oxygen level will be monitored until the medicines you were given have worn off.  You may continue to receive fluids and medicines through an IV tube.  You will start on a clear liquid diet and gradually go back to a normal diet.  Do not drive until your health care provider approves.  You may have some pain in your abdomen. You will be given pain medicine to control the pain.  You will be encouraged to do the following: ? Do breathing exercises to prevent pneumonia. ? Get up and start walking within a day after surgery. You should try to get up 5-6 times a day. This information is not intended to replace advice given to you by your health care provider. Make sure you discuss any questions you have with  your health care provider. Document Released: 04/08/2009 Document Revised: 05/24/2017 Document Reviewed: 03/12/2016 Elsevier Patient Education  2020 [employer].

## 2019-04-02 ENCOUNTER — Other Ambulatory Visit: Payer: BC Managed Care – PPO

## 2019-04-02 ENCOUNTER — Other Ambulatory Visit
Admission: RE | Admit: 2019-04-02 | Discharge: 2019-04-02 | Disposition: A | Discharge status: Home / Self Care | Payer: BC Managed Care – PPO | Source: Ambulatory Visit | Attending: General Surgery | Admitting: General Surgery

## 2019-04-02 DIAGNOSIS — C189 Malignant neoplasm of colon, unspecified: Secondary | ICD-10-CM | POA: Diagnosis not present

## 2019-04-02 DIAGNOSIS — K6389 Other specified diseases of intestine: Secondary | ICD-10-CM | POA: Diagnosis present

## 2019-04-02 LAB — PREALBUMIN: Prealbumin: 24.3 mg/dL (ref 18–38)

## 2019-04-02 NOTE — Progress Notes (Signed)
Tumor Board Documentation  Kevin Mckay was presented by Dr Rogue Bussing at our Tumor Board on 04/02/2019, which included representatives from medical oncology, radiation oncology, pathology, radiology, surgical, navigation, internal medicine, pharmacy, genetics, palliative care, research.  Kevin Mckay currently presents as a current patient, for [place], for new positive pathology with history of the following treatments: active survellience, neoadjuvant chemoradiation.  Additionally, we reviewed previous medical and familial history, history of present illness, and recent lab results along with all available histopathologic and imaging studies. The tumor board considered available treatment options and made the following recommendations: Surgery, Chemotherapy Surgery for Colon cancer then resume Chemotherapy for his lung cancer  The following procedures/referrals were also placed: No orders of the defined types were placed in this encounter.   Clinical Trial Status: not discussed   Staging used: To be determined  AJCC Staging:       Group: Adenocarcinoma of colon   National site-specific guidelines   were discussed with respect to the case.  Tumor board is a meeting of clinicians from various specialty areas who evaluate and discuss patients for whom a multidisciplinary approach is being considered. Final determinations in the plan of care are those of the provider(s). The responsibility for follow up of recommendations given during tumor board is that of the provider.   Today's extended care, comprehensive team conference, Kevin Mckay was not present for the discussion and was not examined.   Multidisciplinary Tumor Board is a multidisciplinary case peer review process.  Decisions discussed in the Multidisciplinary Tumor Board reflect the opinions of the specialists present at the conference without having examined the patient.  Ultimately, treatment and diagnostic decisions rest with the  primary provider(s) and the patient.

## 2019-04-03 LAB — CEA: CEA: 2.8 ng/mL (ref 0.0–4.7)

## 2019-04-06 ENCOUNTER — Ambulatory Visit
Admission: RE | Admit: 2019-04-06 | Discharge: 2019-04-06 | Disposition: A | Discharge status: Home / Self Care | Payer: BC Managed Care – PPO | Source: Ambulatory Visit | Attending: General Surgery | Admitting: General Surgery

## 2019-04-06 ENCOUNTER — Other Ambulatory Visit: Payer: Self-pay

## 2019-04-06 ENCOUNTER — Ambulatory Visit: Admission: RE | Admit: 2019-04-06 | Payer: BC Managed Care – PPO | Source: Ambulatory Visit

## 2019-04-06 ENCOUNTER — Encounter: Payer: Self-pay | Admitting: Internal Medicine

## 2019-04-06 DIAGNOSIS — K6389 Other specified diseases of intestine: Secondary | ICD-10-CM | POA: Diagnosis not present

## 2019-04-06 MED ORDER — IOHEXOL 300 MG/ML  SOLN
100.0000 mL | Freq: Once | INTRAMUSCULAR | Status: AC | PRN
  Administered 2019-04-06: 100 mL via INTRAVENOUS

## 2019-04-07 ENCOUNTER — Ambulatory Visit: Payer: BC Managed Care – PPO

## 2019-04-07 ENCOUNTER — Inpatient Hospital Stay: Payer: BC Managed Care – PPO

## 2019-04-07 ENCOUNTER — Inpatient Hospital Stay: Payer: BC Managed Care – PPO | Attending: Internal Medicine | Admitting: Internal Medicine

## 2019-04-07 ENCOUNTER — Other Ambulatory Visit: Payer: Self-pay

## 2019-04-07 ENCOUNTER — Encounter: Payer: Self-pay | Admitting: Internal Medicine

## 2019-04-07 VITALS — BP 141/71 | HR 73 | Temp 97.9°F | Resp 20 | Ht 68.0 in | Wt 145.0 lb

## 2019-04-07 VITALS — BP 137/70 | HR 69 | Resp 20

## 2019-04-07 DIAGNOSIS — E119 Type 2 diabetes mellitus without complications: Secondary | ICD-10-CM | POA: Diagnosis not present

## 2019-04-07 DIAGNOSIS — Z79899 Other long term (current) drug therapy: Secondary | ICD-10-CM | POA: Insufficient documentation

## 2019-04-07 DIAGNOSIS — C781 Secondary malignant neoplasm of mediastinum: Secondary | ICD-10-CM

## 2019-04-07 DIAGNOSIS — Z7984 Long term (current) use of oral hypoglycemic drugs: Secondary | ICD-10-CM | POA: Insufficient documentation

## 2019-04-07 DIAGNOSIS — C801 Malignant (primary) neoplasm, unspecified: Secondary | ICD-10-CM | POA: Insufficient documentation

## 2019-04-07 DIAGNOSIS — R5382 Chronic fatigue, unspecified: Secondary | ICD-10-CM | POA: Insufficient documentation

## 2019-04-07 DIAGNOSIS — D649 Anemia, unspecified: Secondary | ICD-10-CM

## 2019-04-07 DIAGNOSIS — D500 Iron deficiency anemia secondary to blood loss (chronic): Secondary | ICD-10-CM | POA: Insufficient documentation

## 2019-04-07 DIAGNOSIS — I10 Essential (primary) hypertension: Secondary | ICD-10-CM | POA: Diagnosis not present

## 2019-04-07 DIAGNOSIS — F17210 Nicotine dependence, cigarettes, uncomplicated: Secondary | ICD-10-CM | POA: Insufficient documentation

## 2019-04-07 DIAGNOSIS — C182 Malignant neoplasm of ascending colon: Secondary | ICD-10-CM

## 2019-04-07 DIAGNOSIS — E611 Iron deficiency: Secondary | ICD-10-CM

## 2019-04-07 DIAGNOSIS — K769 Liver disease, unspecified: Secondary | ICD-10-CM | POA: Diagnosis not present

## 2019-04-07 DIAGNOSIS — K921 Melena: Secondary | ICD-10-CM | POA: Insufficient documentation

## 2019-04-07 DIAGNOSIS — F329 Major depressive disorder, single episode, unspecified: Secondary | ICD-10-CM | POA: Insufficient documentation

## 2019-04-07 DIAGNOSIS — I671 Cerebral aneurysm, nonruptured: Secondary | ICD-10-CM | POA: Insufficient documentation

## 2019-04-07 DIAGNOSIS — J45909 Unspecified asthma, uncomplicated: Secondary | ICD-10-CM | POA: Insufficient documentation

## 2019-04-07 LAB — CBC WITH DIFFERENTIAL/PLATELET
Abs Immature Granulocytes: 0.02 10*3/uL (ref 0.00–0.07)
Basophils Absolute: 0.1 10*3/uL (ref 0.0–0.1)
Basophils Relative: 1 %
Eosinophils Absolute: 0.3 10*3/uL (ref 0.0–0.5)
Eosinophils Relative: 3 %
HCT: 29.1 % — ABNORMAL LOW (ref 39.0–52.0)
Hemoglobin: 9.2 g/dL — ABNORMAL LOW (ref 13.0–17.0)
Immature Granulocytes: 0 %
Lymphocytes Relative: 11 %
Lymphs Abs: 0.9 10*3/uL (ref 0.7–4.0)
MCH: 26.2 pg (ref 26.0–34.0)
MCHC: 31.6 g/dL (ref 30.0–36.0)
MCV: 82.9 fL (ref 80.0–100.0)
Monocytes Absolute: 0.6 10*3/uL (ref 0.1–1.0)
Monocytes Relative: 7 %
Neutro Abs: 6.7 10*3/uL (ref 1.7–7.7)
Neutrophils Relative %: 78 %
Platelets: 198 10*3/uL (ref 150–400)
RBC: 3.51 MIL/uL — ABNORMAL LOW (ref 4.22–5.81)
RDW: 19.6 % — ABNORMAL HIGH (ref 11.5–15.5)
WBC: 8.5 10*3/uL (ref 4.0–10.5)
nRBC: 0 % (ref 0.0–0.2)

## 2019-04-07 LAB — COMPREHENSIVE METABOLIC PANEL
ALT: 13 U/L (ref 0–44)
AST: 14 U/L — ABNORMAL LOW (ref 15–41)
Albumin: 3.7 g/dL (ref 3.5–5.0)
Alkaline Phosphatase: 57 U/L (ref 38–126)
Anion gap: 8 (ref 5–15)
BUN: 16 mg/dL (ref 8–23)
CO2: 27 mmol/L (ref 22–32)
Calcium: 8.7 mg/dL — ABNORMAL LOW (ref 8.9–10.3)
Chloride: 101 mmol/L (ref 98–111)
Creatinine, Ser: 0.79 mg/dL (ref 0.61–1.24)
GFR calc Af Amer: >60 mL/min (ref >60)
GFR calc non Af Amer: >60 mL/min (ref >60)
Glucose, Bld: 268 mg/dL — ABNORMAL HIGH (ref 70–99)
Potassium: 4.1 mmol/L (ref 3.5–5.1)
Sodium: 136 mmol/L (ref 135–145)
Total Bilirubin: 0.5 mg/dL (ref 0.3–1.2)
Total Protein: 6.7 g/dL (ref 6.5–8.1)

## 2019-04-07 LAB — SAMPLE TO BLOOD BANK

## 2019-04-07 MED ORDER — IRON SUCROSE 20 MG/ML IV SOLN: 200.0000 mg | Freq: Once | INTRAVENOUS | Status: DC

## 2019-04-07 MED ORDER — SODIUM CHLORIDE 0.9% FLUSH
10.0000 mL | Freq: Once | INTRAVENOUS | Status: AC
  Administered 2019-04-07: 10 mL via INTRAVENOUS
  Filled 2019-04-07: qty 10

## 2019-04-07 MED ORDER — FAMOTIDINE IN NACL 20-0.9 MG/50ML-% IV SOLN
20.0000 mg | Freq: Once | INTRAVENOUS | Status: AC
  Administered 2019-04-07: 20 mg via INTRAVENOUS
  Filled 2019-04-07: qty 50

## 2019-04-07 MED ORDER — METHYLPREDNISOLONE SODIUM SUCC 125 MG IJ SOLR
125.0000 mg | Freq: Once | INTRAMUSCULAR | Status: AC
  Administered 2019-04-07: 125 mg via INTRAVENOUS
  Filled 2019-04-07: qty 2

## 2019-04-07 MED ORDER — HEPARIN SOD (PORK) LOCK FLUSH 100 UNIT/ML IV SOLN
500.0000 [IU] | Freq: Once | INTRAVENOUS | Status: AC
  Administered 2019-04-07: 500 [IU] via INTRAVENOUS
  Filled 2019-04-07: qty 5

## 2019-04-07 MED ORDER — DIPHENHYDRAMINE HCL 25 MG PO CAPS
50.0000 mg | ORAL_CAPSULE | Freq: Once | ORAL | Status: AC
  Administered 2019-04-07: 50 mg via ORAL
  Filled 2019-04-07: qty 2

## 2019-04-07 MED ORDER — SODIUM CHLORIDE 0.9 % IV SOLN
200.0000 mg | Freq: Once | INTRAVENOUS | Status: AC
  Administered 2019-04-07: 200 mg via INTRAVENOUS
  Filled 2019-04-07: qty 10

## 2019-04-07 MED ORDER — DIPHENHYDRAMINE HCL 25 MG PO CAPS
ORAL_CAPSULE | ORAL | Status: AC
  Filled 2019-04-07: qty 1

## 2019-04-07 MED ORDER — SODIUM CHLORIDE 0.9 % IV SOLN
Freq: Once | INTRAVENOUS | Status: AC
  Administered 2019-04-07: 10:00:00 via INTRAVENOUS
  Filled 2019-04-07: qty 250

## 2019-04-07 MED ORDER — ACETAMINOPHEN 325 MG PO TABS
650.0000 mg | ORAL_TABLET | Freq: Once | ORAL | Status: AC
  Administered 2019-04-07: 650 mg via ORAL
  Filled 2019-04-07: qty 2

## 2019-04-07 NOTE — Assessment & Plan Note (Addendum)
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 on [place] etoposide-every 3 weeks with concurrent radiation [finish on Oct 1st].   #Further chemotherapy with carboplatin etoposide on hold because of worsening anemia/colon cancer.  See discussion below  #Colon cancer-s/p colonoscopy biopsy-adenocarcinoma.  Will need colectomy.  S/p evaluation with Dr. Celine Ahr.   #Approximately 1 cm liver lesion noted highly suspicious for malignancy: CT scan-recommend evaluation with MRI.   #Iron deficient anemia-secondary to likely chronic malignancy bleeding recommend Venofer.Allergy to [facility].   #Discussed with patient at length.  Will await our imaging/MRI for further planning.  Also discussed with Dr. Celine Ahr.  # DISPOSITION: # NO chemo today # IV venofer daily x 3.- starting today;  # MRI liver stat # follow up in 3 weeks- MD; labs/cbc/cmp-Dr.B  Addendum: Patient's MRI unfortunately confirms approximately 1 cm enhancing lesion near the dome of the liver; difficult area to biopsy.  Reviewed at the tumor conference-statistically, liver lesion likely from his small cell lung cancer; recommend proceeding with colectomy at this time.  Would not recommend synchronous hepatectomy.  The liver lesion could be addressed later after treatment of his small cell lung cancer.  Discussed my recommendations with the patient on 10/16 at length.  He agrees.

## 2019-04-07 NOTE — Progress Notes (Signed)
[facility] NOTE  Patient Care Team: Laneta Simmers, NP as PCP - General (Nurse Practitioner) Telford Nab, RN as Registered Nurse  CHIEF COMPLAINTS/PURPOSE OF CONSULTATION: Lung cancer   Oncology History Overview Note  # July 2020- SMALL CELL CA METASTATIC TO MEDIASTINAL LN [open Bx; Dr.Oaks]; TxN2M0; July 2nd 2020-PET- 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  MRI brain negative  # aug 17th 2020-carbo etoposide-RT [? 8/19]  #August 2020 iron deficient anemia-question etiology; IV Feraheme [no colonoscopy]  # OCT 2020- colo/ Cecal adeno ca; MRI liver- 1 cm enhancing lesion "metastasis" [Dr.Anna/Dr.Cannon]  # COPD/  DM-2- on OHA/ smoker/ PVD/peripheral neuropathy  # History of alcohol abuse/quit 2014/ Hx of abdominal trauma [at 21y]  # DIAGNOSIS: SMALL CELL CA [? 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 [facility])   02/09/2019 -  Chemotherapy   The patient had palonosetron (ALOXI) injection 0.25 mg, 0.25 mg, Intravenous,  Once, 2 of 4 cycles Administration: 0.25 mg (02/09/2019), 0.25 mg (03/03/2019) CARBOplatin (PARAPLATIN) 410 mg in sodium chloride 0.9 % 250 mL chemo infusion, 410 mg (100 % of original dose 414 mg), Intravenous,  Once, 2 of 4 cycles Dose modification:   (original dose 414 mg, Cycle 1) Administration: 410 mg (02/09/2019), 410 mg (03/03/2019) etoposide (VEPESID) 180 mg in sodium chloride 0.9 % 500 mL chemo infusion, 100 mg/m2 = 180 mg, Intravenous,  Once, 2 of 4 cycles Administration: 180 mg (02/09/2019), 180 mg (02/10/2019), 180 mg (02/11/2019), 180 mg (03/03/2019), 180 mg (03/04/2019), 180 mg (03/05/2019) fosaprepitant (EMEND) 150 mg, dexamethasone (DECADRON) 6  mg in sodium chloride 0.9 % 145 mL IVPB, , Intravenous,  Once, 0 of 2 cycles  for chemotherapy treatment.     HISTORY OF PRESENTING ILLNESS:  Kevin Mckay 68 y.o.  male limited stage small cell-likely lung primary is currently on concurrent chemoradiation carboplatin etoposide is here for follow-up.  In the interim patient had a endoscopy/colonoscopy-showed to have cecal mass/positive for adenocarcinoma.  Patient denies any more bleeding.  Or black or stools.  Chronic fatigue.  Chronic shortness of breath.  No nausea no vomiting.  Review of Systems  Constitutional: Positive for malaise/fatigue. Negative for chills, diaphoresis, fever and weight loss.  HENT: Negative for nosebleeds and sore throat.   Eyes: Negative for double vision.  Respiratory: Positive for cough, sputum production and shortness of breath. Negative for hemoptysis and wheezing.   Cardiovascular: Negative for chest pain, palpitations, orthopnea and leg swelling.  Gastrointestinal: Negative for abdominal pain, blood in stool, constipation, diarrhea, heartburn, melena, nausea and vomiting.  Genitourinary: Negative for dysuria, frequency and urgency.  Musculoskeletal: Negative for back pain and joint pain.  Skin: Negative.  Negative for itching and rash.  Neurological: Positive for tingling and headaches. Negative for dizziness, focal weakness and weakness.  Endo/Heme/Allergies: Does not bruise/bleed easily.  Psychiatric/Behavioral: Negative for depression. The patient is not nervous/anxious and does not have insomnia.      MEDICAL HISTORY:  Past Medical History:  Diagnosis Date  . Asthma   . Cancer ([place])    Metastatic small cell lung cancer  . Cerebral aneurysm   . Chronic painful diabetic neuropathy ([place])   . Depression   . Diabetes mellitus without complication (  [place])   . Essential hypertension   . History of kidney stones   . Occasional tremors   . Tobacco use     SURGICAL HISTORY: Past Surgical  History:  Procedure Laterality Date  . ABDOMINAL SURGERY     age 22. trauma surgery due to forklift injury- liver and spleen  . COLONOSCOPY WITH PROPOFOL N/A 03/27/2019   Procedure: COLONOSCOPY WITH PROPOFOL;  Surgeon: Jonathon Bellows, MD;  Location: [facility] ENDOSCOPY;  Service: Gastroenterology;  Laterality: N/A;  . ESOPHAGOGASTRODUODENOSCOPY (EGD) WITH PROPOFOL N/A 03/27/2019   Procedure: ESOPHAGOGASTRODUODENOSCOPY (EGD) WITH PROPOFOL;  Surgeon: Jonathon Bellows, MD;  Location: [facility] ENDOSCOPY;  Service: Gastroenterology;  Laterality: N/A;  . IR GENERIC HISTORICAL  05/31/2016   IR ANGIO VERTEBRAL SEL VERTEBRAL BILAT MOD SED 05/31/2016 Consuella Lose, MD [facility]-INTERV RAD  . IR GENERIC HISTORICAL  05/31/2016   IR ANGIO INTRA EXTRACRAN SEL INTERNAL CAROTID BILAT MOD SED 05/31/2016 Consuella Lose, MD [facility]-INTER[facility] RAD  . PORTACATH PLACEMENT Right 02/04/2019   Procedure: INSERTION PORT-A-CATH;  Surgeon: [name], MD;  Location: [facility] ORS;  Service: General;  Laterality: Right;  . THORACOTOMY Left 01/22/2019   Procedure: PRE OP BRONCH LEFT ANTERIOR THORACOTOMY WITH BIOPSY OF MEDIASTINAL MASS;  Surgeon: [name], MD;  Location: [facility] ORS;  Service: General;  Laterality: Left;    SOCIAL HISTORY: Social History   Socioeconomic History  . Marital status: Married    Spouse name: Not on file  . Number of children: Not on file  . Years of education: Not on file  . Highest education level: Not on file  Occupational History  . Not on file  Social Needs  . Financial resource strain: Not on file  . Food insecurity    Worry: Not on file    Inability: Not on file  . Transportation needs    Medical: Not on file    Non-medical: Not on file  Tobacco Use  . Smoking status: Current Every Day Smoker    Packs/day: 0.50    Years: 50.00    Pack years: 25.00    Types: Cigarettes  . Smokeless tobacco: Never Used  Substance and Sexual Activity  . Alcohol use: No  . Drug use: Yes    Types: Barbituates, Marijuana   . Sexual activity: Not on file  Lifestyle  . Physical activity    Days per week: Not on file    Minutes per session: Not on file  . Stress: Not on file  Relationships  . Social Herbalist on phone: Not on file    Gets together: Not on file    Attends religious service: Not on file    Active member of club or organization: Not on file    Attends meetings of clubs or organizations: Not on file    Relationship status: Not on file  . Intimate partner violence    Fear of current or ex partner: Not on file    Emotionally abused: Not on file    Physically abused: Not on file    Forced sexual activity: Not on file  Other Topics Concern  . Not on file  Social History Narrative   Lives in [place]; wife/ son/grandson [custody]; smoker; vending business; hx of alcoholism- quit at 77.     FAMILY HISTORY: Family History  Problem Relation Age of Onset  . Lymphoma Father   . Liver cancer Paternal Uncle   . Lung cancer Paternal Uncle   . Lung cancer Maternal Uncle  ALLERGIES:  is allergic to ferumoxytol; amoxicillin; and codeine.  MEDICATIONS:  Current Outpatient Medications  Medication Sig Dispense Refill  . amLODipine (NORVASC) 10 MG tablet Take 10 mg by mouth daily.    . Budesonide-Formoterol Fumarate (SYMBICORT IN) Inhale into the lungs 2 (two) times daily.     . DULoxetine (CYMBALTA) 30 MG capsule Take 30 mg by mouth daily.    . ferrous sulfate 324 (65 Fe) MG TBEC Take 2 tablets by mouth daily.    Marland Kitchen gabapentin (NEURONTIN) 600 MG tablet Take 600 mg by mouth every 6 (six) hours as needed (pain. Not to exceed 3600 mg).     Marland Kitchen glipiZIDE (GLUCOTROL XL) 10 MG 24 hr tablet Take 10 mg by mouth daily with breakfast.    . hydrochlorothiazide (HYDRODIURIL) 25 MG tablet Take 1 tablet by mouth daily.    Marland Kitchen lidocaine-prilocaine (EMLA) cream Apply 1 application topically as needed. 30-45 mins prior to port access. 30 g 0  . linaCLOtide (LINZESS PO) Take 1 capsule by mouth daily.     Marland Kitchen lovastatin (MEVACOR) 20 MG tablet Take 20 mg by mouth every evening.    . metFORMIN (GLUCOPHAGE) 1000 MG tablet Take 1,000 mg by mouth 2 (two) times daily with a meal.    . Multiple Vitamins-Minerals (MULTIVITAMIN WITH MINERALS) tablet Take 1 tablet by mouth daily.    Marland Kitchen omeprazole (PRILOSEC) 40 MG capsule Take 1 capsule (40 mg total) by mouth daily. 90 capsule 1  . ondansetron (ZOFRAN) 8 MG tablet Take 1 tablet (8 mg total) by mouth every 8 (eight) hours as needed for nausea or vomiting (start 3 days; after chemo). 40 tablet 1  . prazosin (MINIPRESS) 1 MG capsule Take 1 mg by mouth at bedtime.    . prochlorperazine (COMPAZINE) 10 MG tablet Take 1 tablet (10 mg total) by mouth every 6 (six) hours as needed for nausea or vomiting. 40 tablet 1  . psyllium (METAMUCIL) 58.6 % powder Take 1 packet by mouth daily.    . sitaGLIPtin (JANUVIA) 25 MG tablet Take 25 mg by mouth daily.    . sucralfate (CARAFATE) 1 GM/10ML suspension Take 76ml three times daily  30 minutes before meals 420 mL 3  . traMADol (ULTRAM) 50 MG tablet Take 1 tablet (50 mg total) by mouth every 8 (eight) hours as needed. 60 tablet 0  . vitamin B-12 (CYANOCOBALAMIN) 100 MCG tablet Take 100 mcg by mouth daily.    . bisacodyl (DULCOLAX) 5 MG EC tablet Take all 4 tablets at 8 am the morning prior to your surgery. 4 tablet 0  . erythromycin base (E-MYCIN) 500 MG tablet Take 2 tablets at 8am, 2 tablets at 2pm, and 2 tablets at 8pm the day prior to surgery. 6 tablet 0  . neomycin (MYCIFRADIN) 500 MG tablet Take 2 tablet at 8am, take 2 tablets at 2pm, and take 2 tablets at 8pm the day prior to your surgery 6 tablet 0  . polyethylene glycol powder (MIRALAX) 17 GM/SCOOP powder Mix full container in 64 ounces of Gatorade or other clear liquid. NO RED Liquids 238 g 0   No current facility-administered medications for this visit.       Marland Kitchen  PHYSICAL EXAMINATION: ECOG PERFORMANCE STATUS: 0 - Asymptomatic  Vitals:   04/07/19 0837  BP: (!)  141/71  Pulse: 73  Resp: 20  Temp: 97.9 F (36.6 C)   Filed Weights   04/07/19 0837  Weight: 145 lb (65.8 kg)    Physical Exam  Constitutional: He is oriented to person, place, and time and well-developed, well-nourished, and in no distress.  HENT:  Head: Normocephalic and atraumatic.  Mouth/Throat: Oropharynx is clear and moist. No oropharyngeal exudate.  Eyes: Pupils are equal, round, and reactive to light.  Neck: Normal range of motion. Neck supple.  Cardiovascular: Normal rate and regular rhythm.  Pulmonary/Chest: No respiratory distress. He has no wheezes.  Decreased air entry bilaterally.  Abdominal: Soft. Bowel sounds are normal. He exhibits no distension and no mass. There is no abdominal tenderness. There is no rebound and no guarding.  Musculoskeletal: Normal range of motion.        General: No tenderness or edema.  Neurological: He is alert and oriented to person, place, and time.  Skin: Skin is warm.  Psychiatric: Affect normal.    LABORATORY DATA:  I have reviewed the data as listed Lab Results  Component Value Date   WBC 8.5 04/07/2019   HGB 9.2 (L) 04/07/2019   HCT 29.1 (L) 04/07/2019   MCV 82.9 04/07/2019   PLT 198 04/07/2019   Recent Labs    03/12/19 0947 03/24/19 0828 04/07/19 0814  NA 136 134* 136  K 3.9 3.4* 4.1  CL 99 98 101  CO2 27 27 27   GLUCOSE 280* 240* 268*  BUN 16 9 16   CREATININE 0.92 0.87 0.79  CALCIUM 8.9 8.2* 8.7*  GFRNONAA >60 >60 >60  GFRAA >60 >60 >60  PROT 6.7 6.0* 6.7  ALBUMIN 3.6 3.1* 3.7  AST 15 13* 14*  ALT 18 14 13   ALKPHOS 57 60 57  BILITOT 0.3 0.5 0.5    RADIOGRAPHIC STUDIES: I have personally reviewed the radiological images as listed and agreed with the findings in the report. Ct Abdomen Pelvis W Contrast  Result Date: 04/06/2019 CLINICAL DATA:  Colorectal carcinoma. Colorectal carcinoma identified on recent colonoscopy. Patient undergoing chemotherapy for small cell lung cancer. EXAM: CT ABDOMEN AND PELVIS  WITH CONTRAST TECHNIQUE: Multidetector CT imaging of the abdomen and pelvis was performed using the standard protocol following bolus administration of intravenous contrast. CONTRAST:  144mL OMNIPAQUE IOHEXOL 300 MG/ML  SOLN COMPARISON:  PET scan 12/25/2018 chest FINDINGS: Lower chest: Lung bases are clear. Hepatobiliary: Small hypodense lesion in the subcapsular RIGHT hepatic lobe measuring 10 mm (image 11/2). No additional hepatic lesion. Gallbladder collapsed. Pancreas: Partial pancreatectomy. Spleen: Normal spleen. Adrenals/urinary tract: Adrenal glands and kidneys are normal. The ureters and bladder normal. Stomach/Bowel: The stomach is grossly normal. Duodenum and small-bowel normal. The terminal ileum and appendix are normal. Within the cecum, at the level of the terminal ileum, there is circumferential colon wall thickening to 1 cm (image 49/2). This occurs over approximately 4 cm segment. No evidence of bowel obstruction. No evidence of extension beyond the mucosal surface of the suspicious lesion. No ileocecal mesentery lymphadenopathy. The ascending colon and transverse colon are normal. Moderate volume stool. Descending colon has multiple diverticula. The sigmoid colon has extensive diverticulosis. The sigmoid colon is tortuous and extends into the RIGHT abdomen. Rectum normal. Vascular/Lymphatic: Abdominal aorta is heavily calcified. No retroperitoneal adenopathy. No periportal lymphadenopathy. There is no pelvic lymphadenopathy. There is extensive venous collateralization in the LEFT upper quadrant and in the epigastric region. Prominent cardinal vein (image 49/4). Reproductive: Prostate normal Other: No free fluid. Musculoskeletal: No aggressive osseous lesion. IMPRESSION: 1. Circumferential cecum lesion at the level of the terminal ileum. Findings concerning for primary colorectal carcinoma. No evidence of tumor extension beyond the serosal surface. No local lymphadenopathy. 2. No abdominopelvic  lymphadenopathy. 3. Low-density lesion in the RIGHT hepatic lobe. Consider MRI with without contrast for further characterization to exclude metastatic colorectal carcinoma. 4. Evidence of portal hypertension with extensive venous collateralization in the LEFT upper quadrant and epigastric region Electronically Signed   By: Suzy Bouchard M.D.   On: 04/06/2019 17:30   Mr Liver W IO Contrast  Result Date: 04/08/2019 CLINICAL DATA:  Colorectal carcinoma. History of prior wrist surgery related to a accident. Remote history lung cancer. EXAM: MRI ABDOMEN WITHOUT AND WITH CONTRAST TECHNIQUE: Multiplanar multisequence MR imaging of the abdomen was performed both before and after the administration of intravenous contrast. CONTRAST:  81mL GADAVIST GADOBUTROL 1 MMOL/ML IV SOLN COMPARISON:  CT 04/06/2019 FINDINGS: Lower chest:  Lung bases are clear. Hepatobiliary: Within the RIGHT hepatic lobe (segment 8) small 1 cm lesion is hyperintense on T2 weighted imaging (image 7/6) and corresponds to the lesion of concern on comparison CT. This lesion demonstrates peripheral uniform rim enhancement on early postcontrast imaging (image 16/12). The lesion washes out on the more delayed sequences (series 16). Some delayed persistent rim enhancement on the most delayed sequence (series 19). No additional hepatic lesions are identified. Gallbladder normal. No biliary duct dilatation. Pancreas: The head of the pancreas is normal. The tail is not identified. Presumed partial pancreatectomy. Spleen: Normal spleen. Adrenals/urinary tract: Adrenal glands kidneys are normal. Stomach/Bowel: Stomach and limited of the small bowel is unremarkable Vascular/Lymphatic: Abdominal aorta normal. Venous collaterals in the LEFT upper quadrant and epigastric varices Musculoskeletal: No aggressive osseous lesion IMPRESSION: 1. Small enhancing lesion in the RIGHT hepatic lobe concerning for HEPATIC METASTASIS. 2. Partial pancreatectomy. 3. Venous  collateralization consistent with portal hypertension. Electronically Signed   By: Suzy Bouchard M.D.   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  TX N2 M0. on [place] etoposide-every 3 weeks with concurrent radiation [finish on Oct 1st].   #Further chemotherapy with carboplatin etoposide on hold because of worsening anemia/colon cancer.  See discussion below  #Colon cancer-s/p colonoscopy biopsy-adenocarcinoma.  Will need colectomy.  S/p evaluation with Dr. Celine Ahr.   #Approximately 1 cm liver lesion noted highly suspicious for malignancy: CT scan-recommend evaluation with MRI.   #Iron deficient anemia-secondary to likely chronic malignancy bleeding recommend Venofer.Allergy to [facility].   #Discussed with patient at length.  Will await our imaging/MRI for further planning.  Also discussed with Dr. Celine Ahr.  # DISPOSITION: # NO chemo today # IV venofer daily x 3.- starting today;  # MRI liver stat # follow up in 3 weeks- MD; labs/cbc/cmp-Dr.B  Addendum: Patient's MRI unfortunately confirms approximately 1 cm enhancing lesion near the dome of the liver; difficult area to biopsy.  Reviewed at the tumor conference-statistically, liver lesion likely from his small cell lung cancer; recommend proceeding with colectomy at this time.  Would not recommend synchronous hepatectomy.  The liver lesion could be addressed later after treatment of his small cell lung cancer.  Discussed my recommendations with the patient on 10/16 at length.  He agrees.  All questions were answered. The patient knows to call the clinic with any problems, questions or concerns.    Cammie Sickle, MD 04/11/2019 6:32 AM

## 2019-04-08 ENCOUNTER — Other Ambulatory Visit: Payer: Self-pay

## 2019-04-08 ENCOUNTER — Ambulatory Visit
Admission: RE | Admit: 2019-04-08 | Discharge: 2019-04-08 | Disposition: A | Discharge status: Home / Self Care | Payer: BC Managed Care – PPO | Source: Ambulatory Visit | Attending: Internal Medicine | Admitting: Internal Medicine

## 2019-04-08 ENCOUNTER — Inpatient Hospital Stay: Payer: BC Managed Care – PPO

## 2019-04-08 ENCOUNTER — Telehealth: Payer: Self-pay | Admitting: Internal Medicine

## 2019-04-08 VITALS — BP 158/74 | HR 76 | Temp 97.5°F | Resp 18

## 2019-04-08 DIAGNOSIS — E611 Iron deficiency: Secondary | ICD-10-CM

## 2019-04-08 DIAGNOSIS — C801 Malignant (primary) neoplasm, unspecified: Secondary | ICD-10-CM | POA: Diagnosis present

## 2019-04-08 DIAGNOSIS — K769 Liver disease, unspecified: Secondary | ICD-10-CM

## 2019-04-08 DIAGNOSIS — C781 Secondary malignant neoplasm of mediastinum: Secondary | ICD-10-CM | POA: Insufficient documentation

## 2019-04-08 DIAGNOSIS — C182 Malignant neoplasm of ascending colon: Secondary | ICD-10-CM

## 2019-04-08 MED ORDER — SODIUM CHLORIDE 0.9 % IV SOLN
Freq: Once | INTRAVENOUS | Status: AC
  Administered 2019-04-08: 10:00:00 via INTRAVENOUS
  Filled 2019-04-08: qty 250

## 2019-04-08 MED ORDER — ACETAMINOPHEN 325 MG PO TABS
650.0000 mg | ORAL_TABLET | Freq: Once | ORAL | Status: AC
  Administered 2019-04-08: 650 mg via ORAL
  Filled 2019-04-08: qty 2

## 2019-04-08 MED ORDER — IRON SUCROSE 20 MG/ML IV SOLN: 200.0000 mg | Freq: Once | INTRAVENOUS | Status: DC

## 2019-04-08 MED ORDER — GADOBUTROL 1 MMOL/ML IV SOLN
6.0000 mL | Freq: Once | INTRAVENOUS | Status: AC | PRN
  Administered 2019-04-08: 6 mL via INTRAVENOUS

## 2019-04-08 MED ORDER — DIPHENHYDRAMINE HCL 25 MG PO CAPS
50.0000 mg | ORAL_CAPSULE | Freq: Once | ORAL | Status: AC
  Administered 2019-04-08: 50 mg via ORAL
  Filled 2019-04-08: qty 2

## 2019-04-08 MED ORDER — HEPARIN SOD (PORK) LOCK FLUSH 100 UNIT/ML IV SOLN
500.0000 [IU] | Freq: Once | INTRAVENOUS | Status: AC | PRN
  Administered 2019-04-08: 500 [IU]
  Filled 2019-04-08: qty 5

## 2019-04-08 MED ORDER — SODIUM CHLORIDE 0.9 % IV SOLN: 200.0000 mg | Freq: Once | INTRAVENOUS | Status: DC

## 2019-04-08 MED ORDER — METHYLPREDNISOLONE SODIUM SUCC 125 MG IJ SOLR
125.0000 mg | Freq: Once | INTRAMUSCULAR | Status: AC
  Administered 2019-04-08: 125 mg via INTRAVENOUS
  Filled 2019-04-08: qty 2

## 2019-04-08 MED ORDER — SODIUM CHLORIDE 0.9 % IV SOLN
200.0000 mg | Freq: Once | INTRAVENOUS | Status: AC
  Administered 2019-04-08: 200 mg via INTRAVENOUS
  Filled 2019-04-08: qty 10

## 2019-04-08 MED ORDER — FAMOTIDINE IN NACL 20-0.9 MG/50ML-% IV SOLN
20.0000 mg | Freq: Once | INTRAVENOUS | Status: AC
  Administered 2019-04-08: 20 mg via INTRAVENOUS
  Filled 2019-04-08: qty 50

## 2019-04-08 NOTE — Progress Notes (Signed)
Patient tolerated infusion well, no questions or concerns, patient discharged in stable condition, VSS

## 2019-04-08 NOTE — Telephone Encounter (Signed)
Reviewed the results of the MRI liver suspicious for metastatic lesion approximately 1 cm-question metastatic small cell lung versus colon primary.  Statistically more likely to be small cell lung than colonic primary-however tissue diagnosis only can confirm.  Question ability to biopsy given the location of the liver lesion.  I would recommend proceeding with upfront colectomy; and not resect liver lesion for the following reasons:  #The patient's immediate risk of death-with severe anemia [nadir 6.6]/GI bleed.   #5-year survival of metastatic colon cancer patients with metastasis to liver-approximately only 20 to 30%  #The median survival of limited stage small cell lung cancer-18 to 24 months.  Post surgery, patient would potentially finish up his 2 more cycles of carbo etoposide for his small cell lung cancer.  Liver lesion could be biopsied/resected after re-staging scans.   I discussed my thoughts with the patient.  Also discussed with Dr. Fredirick Maudlin.   I Would like to present at the tumor conference tomorrow if possible/add on [as I'm not available for the next 2 weeks].

## 2019-04-09 ENCOUNTER — Other Ambulatory Visit: Payer: Self-pay

## 2019-04-09 ENCOUNTER — Inpatient Hospital Stay: Payer: BC Managed Care – PPO

## 2019-04-09 VITALS — BP 150/61 | HR 72 | Temp 96.5°F | Resp 18

## 2019-04-09 DIAGNOSIS — E611 Iron deficiency: Secondary | ICD-10-CM

## 2019-04-09 DIAGNOSIS — C781 Secondary malignant neoplasm of mediastinum: Secondary | ICD-10-CM | POA: Diagnosis not present

## 2019-04-09 MED ORDER — BISACODYL 5 MG PO TBEC: DELAYED_RELEASE_TABLET | ORAL | 0 refills | Status: DC

## 2019-04-09 MED ORDER — NEOMYCIN SULFATE 500 MG PO TABS: ORAL_TABLET | ORAL | 0 refills | Status: DC

## 2019-04-09 MED ORDER — FAMOTIDINE IN NACL 20-0.9 MG/50ML-% IV SOLN
20.0000 mg | Freq: Once | INTRAVENOUS | Status: AC
  Administered 2019-04-09: 20 mg via INTRAVENOUS
  Filled 2019-04-09: qty 50

## 2019-04-09 MED ORDER — METHYLPREDNISOLONE SODIUM SUCC 125 MG IJ SOLR
125.0000 mg | Freq: Once | INTRAMUSCULAR | Status: AC
  Administered 2019-04-09: 125 mg via INTRAVENOUS
  Filled 2019-04-09: qty 2

## 2019-04-09 MED ORDER — DIPHENHYDRAMINE HCL 25 MG PO CAPS
50.0000 mg | ORAL_CAPSULE | Freq: Once | ORAL | Status: AC
  Administered 2019-04-09: 50 mg via ORAL
  Filled 2019-04-09: qty 2

## 2019-04-09 MED ORDER — SODIUM CHLORIDE 0.9% FLUSH
10.0000 mL | Freq: Once | INTRAVENOUS | Status: AC | PRN
  Administered 2019-04-09: 10 mL
  Filled 2019-04-09: qty 10

## 2019-04-09 MED ORDER — SODIUM CHLORIDE 0.9 % IV SOLN
200.0000 mg | Freq: Once | INTRAVENOUS | Status: AC
  Administered 2019-04-09: 200 mg via INTRAVENOUS
  Filled 2019-04-09: qty 10

## 2019-04-09 MED ORDER — HEPARIN SOD (PORK) LOCK FLUSH 100 UNIT/ML IV SOLN
500.0000 [IU] | Freq: Once | INTRAVENOUS | Status: AC | PRN
  Administered 2019-04-09: 500 [IU]
  Filled 2019-04-09: qty 5

## 2019-04-09 MED ORDER — POLYETHYLENE GLYCOL 3350 17 GM/SCOOP PO POWD: ORAL | 0 refills | Status: DC

## 2019-04-09 MED ORDER — ERYTHROMYCIN BASE 500 MG PO TABS: ORAL_TABLET | ORAL | 0 refills | Status: DC

## 2019-04-09 MED ORDER — SODIUM CHLORIDE 0.9 % IV SOLN
Freq: Once | INTRAVENOUS | Status: AC
  Administered 2019-04-09: 09:00:00 via INTRAVENOUS
  Filled 2019-04-09: qty 250

## 2019-04-09 MED ORDER — ACETAMINOPHEN 325 MG PO TABS
650.0000 mg | ORAL_TABLET | Freq: Once | ORAL | Status: AC
  Administered 2019-04-09: 650 mg via ORAL
  Filled 2019-04-09: qty 2

## 2019-04-09 NOTE — Progress Notes (Signed)
Tumor Board Documentation  Kevin Mckay was presented by Dr Rogue Bussing at our Tumor Board on 04/09/2019, which included representatives from medical oncology, radiation oncology, pathology, radiology, surgical, navigation, internal medicine, pharmacy, genetics, pulmonology, palliative care, research.  Kevin Mckay currently presents as a current patient, for discussion with history of the following treatments: neoadjuvant chemoradiation.  Additionally, we reviewed previous medical and familial history, history of present illness, and recent lab results along with all available histopathologic and imaging studies. The tumor board considered available treatment options and made the following recommendations: Biopsy [place] vs CT guided biopsy of Liver  The following procedures/referrals were also placed: No orders of the defined types were placed in this encounter.   Clinical Trial Status:     Staging used:    National site-specific guidelines   were discussed with respect to the case.  Tumor board is a meeting of clinicians from various specialty areas who evaluate and discuss patients for whom a multidisciplinary approach is being considered. Final determinations in the plan of care are those of the provider(s). The responsibility for follow up of recommendations given during tumor board is that of the provider.   Today's extended care, comprehensive team conference, Kevin Mckay was not present for the discussion and was not examined.   Multidisciplinary Tumor Board is a multidisciplinary case peer review process.  Decisions discussed in the Multidisciplinary Tumor Board reflect the opinions of the specialists present at the conference without having examined the patient.  Ultimately, treatment and diagnostic decisions rest with the primary provider(s) and the patient.

## 2019-04-09 NOTE — Progress Notes (Signed)
Request for Medical Clearance has been faxed to Brooke Bonito NP [facility]: [phone] Fax: [phone]  Request for Pulmonary Clearance has been faxed to Dr Lanney Gins at [facility]: [phone] Fax: [phone]

## 2019-04-15 ENCOUNTER — Other Ambulatory Visit: Payer: Self-pay

## 2019-04-15 MED ORDER — ERYTHROMYCIN BASE COATED 500 MG PO TBEC: DELAYED_RELEASE_TABLET | ORAL | 0 refills | Status: DC

## 2019-04-21 ENCOUNTER — Telehealth: Payer: Self-pay

## 2019-04-21 NOTE — Telephone Encounter (Signed)
Pulmonary clearance received from Dr Lanney Gins with medium risk. Pulmonary function test attached.

## 2019-04-24 ENCOUNTER — Telehealth: Payer: Self-pay

## 2019-04-24 NOTE — Telephone Encounter (Signed)
Pt has been advised of pre admission date/time, Covid Testing date and Surgery date.  Surgery Date: 05/04/19 Preadmission Testing Date: 04/29/19 @11 :00 am Covid Testing Date: 04/30/19 - patient advised to go to the [place] ([place]) between 8-10:30 am. Earl Lites Video sent via [facility] Surgical Video and Covid Education Video.  Patient has been made aware to call [phone], between 1-3:00pm the Friday before surgery, to find out what time to arrive.    Bowel prep information reviewed with patient and instructions have been mailed to him. Prescriptions have been picked up by the patient already.

## 2019-04-24 NOTE — Progress Notes (Signed)
04/24/19 Medical Clearance was received from [employer]. Patient is optimized for surgery at Medium Risk.

## 2019-04-27 ENCOUNTER — Ambulatory Visit (INDEPENDENT_AMBULATORY_CARE_PROVIDER_SITE_OTHER): Payer: BC Managed Care – PPO | Admitting: Gastroenterology

## 2019-04-27 ENCOUNTER — Other Ambulatory Visit: Payer: Self-pay

## 2019-04-27 ENCOUNTER — Encounter: Payer: Self-pay | Admitting: Gastroenterology

## 2019-04-27 ENCOUNTER — Other Ambulatory Visit: Payer: Self-pay | Admitting: General Surgery

## 2019-04-27 VITALS — BP 173/82 | HR 76 | Temp 98.1°F | Ht 68.0 in | Wt 145.8 lb

## 2019-04-27 DIAGNOSIS — K5900 Constipation, unspecified: Secondary | ICD-10-CM

## 2019-04-27 DIAGNOSIS — C189 Malignant neoplasm of colon, unspecified: Secondary | ICD-10-CM | POA: Diagnosis not present

## 2019-04-27 NOTE — Progress Notes (Signed)
Jonathon Bellows MD, MRCP(U.K) [address]  [place]  [place], [place] [address]  Main: [phone]  Fax: [phone]   Primary Care Physician: Laneta Simmers, NP  Primary Gastroenterologist:  Dr. Jonathon Bellows   Colon cancer follow-up  HPI: Kevin Mckay is a 68 y.o. male    Summary of history :  Kevin Mckay is a 68 y.o. male recently underwent an EGD and colonoscopy to evaluate for iron deficiency anemia.  He has a history of metastatic lung cancer.  I performed the EGD on 03/27/2019: I noticed a thickened fold in the cardia of the stomach.  Normal overlying mucosa.  No biopsies were taken.  Rest of the examination was normal.  The colonoscopy revealed a large circumferential mass was seen in the cecum ulcerated nonobstructing no active bleeding noted.  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  In addition there were 3 sessile polyps found in the rectum and ascending colon the polyps were large in size polypectomy was not attempted due to poor endoscopy visualization due to a poor bowel prep.  Interval history   03/30/2019-04/27/2019  He has been scheduled for a right hemicolectomy on 05/04/2019 04/08/2019: Small enhancing lesion in the right hepatic lobe concerning for hepatic metastasis.  Venous collateralization consistent with portal hypertension.  Partial pancreatectomy.  04/06/2019: CT scan of the abdomen with contrast shows a circumferential cecal lesion at the level of the terminal ileum concerning for primary colorectal carcinoma.  No evidence of tumor extension beyond the serosal surface.  Low-density lesion in the right hepatic lobe.  Evidence of portal hypertension with extensive venous collateralization to the left upper quadrant and epigastric region  Since his colonoscopy he has been doing well.  Has been having brown stools.  No other complaints.  His constipation has responded well to a combination of MiraLAX and Linzess samples that I  provided him the last time.  He has 5 children.  Current Outpatient Medications  Medication Sig Dispense Refill   amLODipine (NORVASC) 10 MG tablet Take 10 mg by mouth daily.     bisacodyl (DULCOLAX) 5 MG EC tablet Take all 4 tablets at 8 am the morning prior to your surgery. 4 tablet 0   budesonide-formoterol (SYMBICORT) 160-4.5 MCG/ACT inhaler Inhale 2 puffs into the lungs 2 (two) times daily.     DULoxetine (CYMBALTA) 30 MG capsule Take 30 mg by mouth daily.     erythromycin (PCE) 500 MG EC tablet Take 2 tablets by mouth at 8 am, take 2 tablets by mouth at 2 pm, and take 2 tablets by mouth at 8 pm the day prior to your surgery 6 tablet 0   erythromycin base (E-MYCIN) 500 MG tablet Take 2 tablets at 8am, 2 tablets at 2pm, and 2 tablets at 8pm the day prior to surgery. 6 tablet 0   ferrous sulfate 324 (65 Fe) MG TBEC Take 324 mg by mouth daily.      gabapentin (NEURONTIN) 300 MG capsule Take 600 mg by mouth 3 (three) times daily.      glipiZIDE (GLUCOTROL XL) 10 MG 24 hr tablet Take 10 mg by mouth daily with breakfast.     hydrochlorothiazide (HYDRODIURIL) 25 MG tablet Take 25 mg by mouth daily.      lidocaine-prilocaine (EMLA) cream Apply 1 application topically as needed. 30-45 mins prior to port access. 30 g 0   linaclotide (LINZESS) 145 MCG CAPS capsule Take 145 mcg by mouth daily before breakfast.     lovastatin (MEVACOR)  20 MG tablet Take 20 mg by mouth every evening.     metFORMIN (GLUCOPHAGE) 1000 MG tablet Take 1,000 mg by mouth 2 (two) times daily with a meal.     Multiple Vitamins-Minerals (MULTIVITAMIN WITH MINERALS) tablet Take 1 tablet by mouth daily.     neomycin (MYCIFRADIN) 500 MG tablet Take 2 tablet at 8am, take 2 tablets at 2pm, and take 2 tablets at 8pm the day prior to your surgery 6 tablet 0   omeprazole (PRILOSEC) 40 MG capsule Take 1 capsule (40 mg total) by mouth daily. 90 capsule 1   ondansetron (ZOFRAN) 8 MG tablet Take 1 tablet (8 mg total) by  mouth every 8 (eight) hours as needed for nausea or vomiting (start 3 days; after chemo). 40 tablet 1   polyethylene glycol powder (MIRALAX) 17 GM/SCOOP powder Mix full container in 64 ounces of Gatorade or other clear liquid. NO RED Liquids 238 g 0   prazosin (MINIPRESS) 1 MG capsule Take 1 mg by mouth at bedtime.     prochlorperazine (COMPAZINE) 10 MG tablet Take 1 tablet (10 mg total) by mouth every 6 (six) hours as needed for nausea or vomiting. 40 tablet 1   psyllium (METAMUCIL) 58.6 % powder Take 1 packet by mouth daily.     sitaGLIPtin (JANUVIA) 25 MG tablet Take 25 mg by mouth daily.     sucralfate (CARAFATE) 1 GM/10ML suspension Take 69ml three times daily  30 minutes before meals 420 mL 3   traMADol (ULTRAM) 50 MG tablet Take 1 tablet (50 mg total) by mouth every 8 (eight) hours as needed. (Patient taking differently: Take 50 mg by mouth every 8 (eight) hours as needed (for pain.). ) 60 tablet 0   vitamin B-12 (CYANOCOBALAMIN) 100 MCG tablet Take 100 mcg by mouth daily.     No current facility-administered medications for this visit.     Allergies as of 04/27/2019 - Review Complete 04/22/2019  Allergen Reaction Noted   Ferumoxytol Shortness Of Breath and Other (See Comments) 03/24/2019   Amoxicillin Hives 03/31/2015   Codeine Nausea Only 03/31/2015    ROS:  General: Negative for anorexia, weight loss, fever, chills, fatigue, weakness. ENT: Negative for hoarseness, difficulty swallowing , nasal congestion. CV: Negative for chest pain, angina, palpitations, dyspnea on exertion, peripheral edema.  Respiratory: Negative for dyspnea at rest, dyspnea on exertion, cough, sputum, wheezing.  GI: See history of present illness. GU:  Negative for dysuria, hematuria, urinary incontinence, urinary frequency, nocturnal urination.  Endo: Negative for unusual weight change.    Physical Examination:   There were no vitals taken for this visit.  General: Well-nourished,  well-developed in no acute distress.  Eyes: No icterus. Conjunctivae pink. Mouth: Oropharyngeal mucosa moist and pink , no lesions erythema or exudate. Lungs: Clear to auscultation bilaterally. Non-labored. Heart: Regular rate and rhythm, no murmurs rubs or gallops.  Abdomen: Bowel sounds are normal, nontender, nondistended, no hepatosplenomegaly or masses, no abdominal bruits or hernia , no rebound or guarding.   Extremities: No lower extremity edema. No clubbing or deformities. Neuro: Alert and oriented x 3.  Grossly intact. Skin: Warm and dry, no jaundice.   Psych: Alert and cooperative, normal mood and affect.   Imaging Studies: Ct Abdomen Pelvis W Contrast  Result Date: 04/06/2019 CLINICAL DATA:  Colorectal carcinoma. Colorectal carcinoma identified on recent colonoscopy. Patient undergoing chemotherapy for small cell lung cancer. EXAM: CT ABDOMEN AND PELVIS WITH CONTRAST TECHNIQUE: Multidetector CT imaging of the abdomen and pelvis was performed  using the standard protocol following bolus administration of intravenous contrast. CONTRAST:  159mL OMNIPAQUE IOHEXOL 300 MG/ML  SOLN COMPARISON:  PET scan 12/25/2018 chest FINDINGS: Lower chest: Lung bases are clear. Hepatobiliary: Small hypodense lesion in the subcapsular RIGHT hepatic lobe measuring 10 mm (image 11/2). No additional hepatic lesion. Gallbladder collapsed. Pancreas: Partial pancreatectomy. Spleen: Normal spleen. Adrenals/urinary tract: Adrenal glands and kidneys are normal. The ureters and bladder normal. Stomach/Bowel: The stomach is grossly normal. Duodenum and small-bowel normal. The terminal ileum and appendix are normal. Within the cecum, at the level of the terminal ileum, there is circumferential colon wall thickening to 1 cm (image 49/2). This occurs over approximately 4 cm segment. No evidence of bowel obstruction. No evidence of extension beyond the mucosal surface of the suspicious lesion. No ileocecal mesentery  lymphadenopathy. The ascending colon and transverse colon are normal. Moderate volume stool. Descending colon has multiple diverticula. The sigmoid colon has extensive diverticulosis. The sigmoid colon is tortuous and extends into the RIGHT abdomen. Rectum normal. Vascular/Lymphatic: Abdominal aorta is heavily calcified. No retroperitoneal adenopathy. No periportal lymphadenopathy. There is no pelvic lymphadenopathy. There is extensive venous collateralization in the LEFT upper quadrant and in the epigastric region. Prominent cardinal vein (image 49/4). Reproductive: Prostate normal Other: No free fluid. Musculoskeletal: No aggressive osseous lesion. IMPRESSION: 1. Circumferential cecum lesion at the level of the terminal ileum. Findings concerning for primary colorectal carcinoma. No evidence of tumor extension beyond the serosal surface. No local lymphadenopathy. 2. No abdominopelvic lymphadenopathy. 3. Low-density lesion in the RIGHT hepatic lobe. Consider MRI with without contrast for further characterization to exclude metastatic colorectal carcinoma. 4. Evidence of portal hypertension with extensive venous collateralization in the LEFT upper quadrant and epigastric region Electronically Signed   By: Suzy Bouchard M.D.   On: 04/06/2019 17:30   Mr Liver W KK Contrast  Result Date: 04/08/2019 CLINICAL DATA:  Colorectal carcinoma. History of prior wrist surgery related to a accident. Remote history lung cancer. EXAM: MRI ABDOMEN WITHOUT AND WITH CONTRAST TECHNIQUE: Multiplanar multisequence MR imaging of the abdomen was performed both before and after the administration of intravenous contrast. CONTRAST:  52mL GADAVIST GADOBUTROL 1 MMOL/ML IV SOLN COMPARISON:  CT 04/06/2019 FINDINGS: Lower chest:  Lung bases are clear. Hepatobiliary: Within the RIGHT hepatic lobe (segment 8) small 1 cm lesion is hyperintense on T2 weighted imaging (image 7/6) and corresponds to the lesion of concern on comparison CT. This  lesion demonstrates peripheral uniform rim enhancement on early postcontrast imaging (image 16/12). The lesion washes out on the more delayed sequences (series 16). Some delayed persistent rim enhancement on the most delayed sequence (series 19). No additional hepatic lesions are identified. Gallbladder normal. No biliary duct dilatation. Pancreas: The head of the pancreas is normal. The tail is not identified. Presumed partial pancreatectomy. Spleen: Normal spleen. Adrenals/urinary tract: Adrenal glands kidneys are normal. Stomach/Bowel: Stomach and limited of the small bowel is unremarkable Vascular/Lymphatic: Abdominal aorta normal. Venous collaterals in the LEFT upper quadrant and epigastric varices Musculoskeletal: No aggressive osseous lesion IMPRESSION: 1. Small enhancing lesion in the RIGHT hepatic lobe concerning for HEPATIC METASTASIS. 2. Partial pancreatectomy. 3. Venous collateralization consistent with portal hypertension. Electronically Signed   By: Suzy Bouchard M.D.   On: 04/08/2019 15:44    Assessment and Plan:   Kevin Mckay is a 68 y.o. y/o male recently underwent an EGD and colonoscopy to evaluate for iron deficiency anemia.  EGD showed a submucosal lesion in the cardia that was not bleeding.  In  addition on colonoscopy noted a large mass in the cecum which endoscopically as well as on the biopsies have proven to be invasive adenocarcinoma. There were 3 other polyps seen in the colon which were not resected as his bowel prep was very poor.  He has been scheduled for right hemicolectomy with Dr. Celine Ahr next week.  He is receiving IV iron with Dr. Rogue Bussing.  I would suggest to refer back to me in case there is a requirement to resect the other polyps in the colon based on the overall prognosis of his lung and colon cancer.  I have also suggested his children over the age of 78 to undergo a colonoscopy for colon cancer screening.  I will provide him few more samples of Linzess and he  can call us when he needs a prescription.  If constipation has responded well to Linzess.   Dr Jonathon Bellows  MD,MRCP [facility]) Follow up in as needed

## 2019-04-28 ENCOUNTER — Other Ambulatory Visit: Payer: Self-pay

## 2019-04-28 ENCOUNTER — Inpatient Hospital Stay: Payer: BC Managed Care – PPO | Attending: Internal Medicine

## 2019-04-28 ENCOUNTER — Inpatient Hospital Stay (HOSPITAL_BASED_OUTPATIENT_CLINIC_OR_DEPARTMENT_OTHER): Payer: BC Managed Care – PPO | Admitting: Internal Medicine

## 2019-04-28 ENCOUNTER — Encounter: Payer: Self-pay | Admitting: Internal Medicine

## 2019-04-28 DIAGNOSIS — K769 Liver disease, unspecified: Secondary | ICD-10-CM

## 2019-04-28 DIAGNOSIS — Z7984 Long term (current) use of oral hypoglycemic drugs: Secondary | ICD-10-CM | POA: Diagnosis not present

## 2019-04-28 DIAGNOSIS — J449 Chronic obstructive pulmonary disease, unspecified: Secondary | ICD-10-CM | POA: Insufficient documentation

## 2019-04-28 DIAGNOSIS — Z9221 Personal history of antineoplastic chemotherapy: Secondary | ICD-10-CM | POA: Diagnosis not present

## 2019-04-28 DIAGNOSIS — C781 Secondary malignant neoplasm of mediastinum: Secondary | ICD-10-CM

## 2019-04-28 DIAGNOSIS — F17210 Nicotine dependence, cigarettes, uncomplicated: Secondary | ICD-10-CM | POA: Insufficient documentation

## 2019-04-28 DIAGNOSIS — C189 Malignant neoplasm of colon, unspecified: Secondary | ICD-10-CM | POA: Insufficient documentation

## 2019-04-28 DIAGNOSIS — I739 Peripheral vascular disease, unspecified: Secondary | ICD-10-CM | POA: Insufficient documentation

## 2019-04-28 DIAGNOSIS — Z79899 Other long term (current) drug therapy: Secondary | ICD-10-CM | POA: Insufficient documentation

## 2019-04-28 DIAGNOSIS — D509 Iron deficiency anemia, unspecified: Secondary | ICD-10-CM | POA: Insufficient documentation

## 2019-04-28 DIAGNOSIS — F329 Major depressive disorder, single episode, unspecified: Secondary | ICD-10-CM | POA: Diagnosis not present

## 2019-04-28 DIAGNOSIS — C801 Malignant (primary) neoplasm, unspecified: Secondary | ICD-10-CM | POA: Diagnosis not present

## 2019-04-28 DIAGNOSIS — Z923 Personal history of irradiation: Secondary | ICD-10-CM | POA: Diagnosis not present

## 2019-04-28 DIAGNOSIS — E119 Type 2 diabetes mellitus without complications: Secondary | ICD-10-CM | POA: Insufficient documentation

## 2019-04-28 DIAGNOSIS — D500 Iron deficiency anemia secondary to blood loss (chronic): Secondary | ICD-10-CM | POA: Diagnosis not present

## 2019-04-28 DIAGNOSIS — I10 Essential (primary) hypertension: Secondary | ICD-10-CM | POA: Insufficient documentation

## 2019-04-28 DIAGNOSIS — C3490 Malignant neoplasm of unspecified part of unspecified bronchus or lung: Secondary | ICD-10-CM | POA: Insufficient documentation

## 2019-04-28 DIAGNOSIS — C182 Malignant neoplasm of ascending colon: Secondary | ICD-10-CM

## 2019-04-28 LAB — CBC WITH DIFFERENTIAL/PLATELET
Abs Immature Granulocytes: 0.02 10*3/uL (ref 0.00–0.07)
Basophils Absolute: 0 10*3/uL (ref 0.0–0.1)
Basophils Relative: 1 %
Eosinophils Absolute: 0.3 10*3/uL (ref 0.0–0.5)
Eosinophils Relative: 5 %
HCT: 28.7 % — ABNORMAL LOW (ref 39.0–52.0)
Hemoglobin: 8.9 g/dL — ABNORMAL LOW (ref 13.0–17.0)
Immature Granulocytes: 0 %
Lymphocytes Relative: 16 %
Lymphs Abs: 1.1 10*3/uL (ref 0.7–4.0)
MCH: 26.8 pg (ref 26.0–34.0)
MCHC: 31 g/dL (ref 30.0–36.0)
MCV: 86.4 fL (ref 80.0–100.0)
Monocytes Absolute: 0.4 10*3/uL (ref 0.1–1.0)
Monocytes Relative: 7 %
Neutro Abs: 4.6 10*3/uL (ref 1.7–7.7)
Neutrophils Relative %: 71 %
Platelets: 177 10*3/uL (ref 150–400)
RBC: 3.32 MIL/uL — ABNORMAL LOW (ref 4.22–5.81)
RDW: 18.4 % — ABNORMAL HIGH (ref 11.5–15.5)
WBC: 6.4 10*3/uL (ref 4.0–10.5)
nRBC: 0 % (ref 0.0–0.2)

## 2019-04-28 LAB — COMPREHENSIVE METABOLIC PANEL
ALT: 17 U/L (ref 0–44)
AST: 16 U/L (ref 15–41)
Albumin: 3.7 g/dL (ref 3.5–5.0)
Alkaline Phosphatase: 69 U/L (ref 38–126)
Anion gap: 12 (ref 5–15)
BUN: 18 mg/dL (ref 8–23)
CO2: 23 mmol/L (ref 22–32)
Calcium: 9.1 mg/dL (ref 8.9–10.3)
Chloride: 101 mmol/L (ref 98–111)
Creatinine, Ser: 0.98 mg/dL (ref 0.61–1.24)
GFR calc Af Amer: >60 mL/min (ref >60)
GFR calc non Af Amer: >60 mL/min (ref >60)
Glucose, Bld: 390 mg/dL — ABNORMAL HIGH (ref 70–99)
Potassium: 4.4 mmol/L (ref 3.5–5.1)
Sodium: 136 mmol/L (ref 135–145)
Total Bilirubin: 0.4 mg/dL (ref 0.3–1.2)
Total Protein: 6.7 g/dL (ref 6.5–8.1)

## 2019-04-28 NOTE — Assessment & Plan Note (Addendum)
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 on [place] etoposide-every 3 weeks with concurrent radiation [finish on Oct 1st].  Continue to hold [place] etoposide x2 cycles-while awaiting colon resection./See below.  #Colon cancer-s/p colonoscopy biopsy-adenocarcinoma.  Will need colectomy-awaiting surgery on November 9.  #Liver lesion approximately 1 cm-difficulty biopsy/statistically suspicious of small cell.  However await above surgery/will repeat imaging post surgery.  #Iron deficient anemia-secondary to likely chronic bleeding from colon cancer.  Hemoglobin today is 8.9.  Recommend IV Venofer x1.  Discussed with Dr. Celine Ahr.  # DISPOSITION: # IV venofer x1 this week.  # follow up in 3 weeks- MD; labs/cbc/cmp' possible venofer-Dr.B's

## 2019-04-28 NOTE — Progress Notes (Signed)
I connected with Kevin Mckay on 110/08/2018 at  1:45 PM EST by video enabled telemedicine visit and verified that I am speaking with the correct person using two identifiers.  I discussed the limitations, risks, security and privacy concerns of performing an evaluation and management service by telemedicine and the availability of in-person appointments. I also discussed with the patient that there may be a patient responsible charge related to this service. The patient expressed understanding and agreed to proceed.    Other persons participating in the visit and their role in the encounter: RN/medical reconciliation Patient's location: Home Provider's location: Home  Oncology History Overview Note  # July 2020- SMALL CELL CA METASTATIC TO MEDIASTINAL LN [open Bx; Dr.Oaks]; TxN2M0; July 2nd 2020-PET- 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  MRI brain negative  # aug 17th 2020-carbo etoposide-RT [? 8/19]  #August 2020 iron deficient anemia-question etiology; IV Feraheme [no colonoscopy]  # OCT 2020- colo/ Cecal adeno ca; MRI liver- 1 cm enhancing lesion "metastasis" [Dr.Anna/Dr.Cannon]-difficulty biopsy.  November 02-2019 right hemicolectomy planned  # COPD/  DM-2- on OHA/ smoker/ PVD/peripheral neuropathy  # History of alcohol abuse/quit 2014/ Hx of abdominal trauma [at 21y]  # DIAGNOSIS: SMALL CELL CA [? 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 [facility])   02/09/2019 -  Chemotherapy   The patient had palonosetron (ALOXI) injection 0.25 mg, 0.25 mg, Intravenous,  Once, 2 of 4 cycles Administration: 0.25 mg (02/09/2019), 0.25 mg (03/03/2019) CARBOplatin (PARAPLATIN) 410 mg in sodium chloride 0.9 %  250 mL chemo infusion, 410 mg (100 % of original dose 414 mg), Intravenous,  Once, 2 of 4 cycles Dose modification:   (original dose 414 mg, Cycle 1) Administration: 410 mg (02/09/2019), 410 mg (03/03/2019) etoposide (VEPESID) 180 mg in sodium chloride 0.9 % 500 mL chemo infusion, 100 mg/m2 = 180 mg, Intravenous,  Once, 2 of 4 cycles Administration: 180 mg (02/09/2019), 180 mg (02/10/2019), 180 mg (02/11/2019), 180 mg (03/03/2019), 180 mg (03/04/2019), 180 mg (03/05/2019) fosaprepitant (EMEND) 150 mg, dexamethasone (DECADRON) 6 mg in sodium chloride 0.9 % 145 mL IVPB, , Intravenous,  Once, 0 of 2 cycles  for chemotherapy treatment.     Chief Complaint: Colon cancer/lung cancer/anemia   History of present illness:Kevin Mckay 68 y.o.  male with history of limited stage small cell lung cancer currently s/p chemoradiation; and also new diagnosis of right colon cancer; anemia secondary to iron deficiency/chronic GI bleed; and new lesion in the liver is here for follow-up.  Patient has had pulmonary evaluation; also had evaluation with PCP.  He is awaiting right hemicolectomy on November 9.  Patient denies any worsening shortness of breath or cough.  Denies any blood in stools or black or stools.  Complains of fatigue.  Denies any headaches.  No nausea no vomiting.  No abdominal pain.  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  TX N2 M0. on [place] etoposide-every 3 weeks with concurrent radiation [finish on Oct 1st].  Continue to hold [place] etoposide x2 cycles-while awaiting colon resection./See below.  #Colon cancer-s/p colonoscopy biopsy-adenocarcinoma.  Will need colectomy-awaiting surgery on November 9.  #Liver lesion  approximately 1 cm-difficulty biopsy/statistically suspicious of small cell.  However await above surgery/will repeat imaging post  surgery.  #Iron deficient anemia-secondary to likely chronic bleeding from colon cancer.  Hemoglobin today is 8.9.  Recommend IV Venofer x1.  Discussed with Dr. Celine Ahr.  # DISPOSITION: # IV venofer x1 this week.  # follow up in 3 weeks- MD; labs/cbc/cmp' possible venofer-Dr.B's  Follow-up instructions:  I discussed the assessment and treatment plan with the patient.  The patient was provided an opportunity to ask questions and all were answered.  The patient agreed with the plan and demonstrated understanding of instructions.  The patient was advised to call back or seek an in person evaluation if the symptoms worsen or if the condition fails to improve as anticipated.  Dr. Charlaine Dalton [facility] 04/29/2019 8:40 AM

## 2019-04-29 ENCOUNTER — Other Ambulatory Visit: Payer: Self-pay

## 2019-04-29 ENCOUNTER — Encounter: Payer: Self-pay | Admitting: Radiation Oncology

## 2019-04-29 ENCOUNTER — Ambulatory Visit
Admission: RE | Admit: 2019-04-29 | Discharge: 2019-04-29 | Disposition: A | Discharge status: Home / Self Care | Payer: BC Managed Care – PPO | Source: Ambulatory Visit | Attending: Radiation Oncology | Admitting: Radiation Oncology

## 2019-04-29 ENCOUNTER — Encounter
Admission: RE | Admit: 2019-04-29 | Discharge: 2019-04-29 | Disposition: A | Discharge status: Home / Self Care | Payer: BC Managed Care – PPO | Source: Ambulatory Visit | Attending: General Surgery | Admitting: General Surgery

## 2019-04-29 VITALS — BP 152/63 | HR 80 | Temp 95.9°F | Resp 18 | Wt 145.3 lb

## 2019-04-29 DIAGNOSIS — C781 Secondary malignant neoplasm of mediastinum: Secondary | ICD-10-CM

## 2019-04-29 DIAGNOSIS — F17210 Nicotine dependence, cigarettes, uncomplicated: Secondary | ICD-10-CM | POA: Insufficient documentation

## 2019-04-29 DIAGNOSIS — C801 Malignant (primary) neoplasm, unspecified: Secondary | ICD-10-CM | POA: Insufficient documentation

## 2019-04-29 DIAGNOSIS — Z923 Personal history of irradiation: Secondary | ICD-10-CM | POA: Diagnosis not present

## 2019-04-29 LAB — TYPE AND SCREEN
ABO/RH(D): A POS
Antibody Screen: NEGATIVE

## 2019-04-29 NOTE — Pre-Procedure Instructions (Signed)
GLUCOSE DONE YESTERDAY AT [facility]-DR KEPHART WANTING MEDICAL CLEARANCE. ALSO INFORMED DR Ronelle Nigh THAT DR Celine Ahr WAS REQUESTING ANESTHESIA CONSULT. CARYL-LYN NOTIFIED OF THIS AT DR CANNONS OFFICE.FAXED CLEARANCE TO DR Celine Ahr AND TO PCP

## 2019-04-29 NOTE — Progress Notes (Signed)
Radiation Oncology Follow up Note  Name: Kevin Mckay   Date:   04/29/2019 MRN:  [id number] DOB: 09-28-1950    This 68 y.o. male presents to the clinic today for 1 month follow-up status post concurrent chemoradiation therapy for limited stage small cell lung cancer stage T2 N0 M0.  REFERRING PROVIDER: Laneta Simmers, NP  HPI: Patient is a 68 year old male now at 1 month having completed concurrent chemoradiation therapy for limited stage small cell lung cancer seen today in routine follow-up he is doing well.  He specifically denies cough hemoptysis or chest tightness..  Patient also has a known adenocarcinoma of the right colon and is scheduled for right hemicolectomy early next week.  He also is a 1 cm enhancing lesion in his liver.  COMPLICATIONS OF TREATMENT: none  FOLLOW UP COMPLIANCE: keeps appointments   PHYSICAL EXAM:  BP (!) 152/63   Pulse 80   Temp (!) 95.9 F (35.5 C)   Resp 18   Wt 145 lb 4.8 oz (65.9 kg)   BMI 22.09 kg/m  Well-developed well-nourished patient in NAD. HEENT reveals PERLA, EOMI, discs not visualized.  Oral cavity is clear. No oral mucosal lesions are identified. Neck is clear without evidence of cervical or supraclavicular adenopathy. Lungs are clear to A&P. Cardiac examination is essentially unremarkable with regular rate and rhythm without murmur rub or thrill. Abdomen is benign with no organomegaly or masses noted. Motor sensory and DTR levels are equal and symmetric in the upper and lower extremities. Cranial nerves II through XII are grossly intact. Proprioception is intact. No peripheral adenopathy or edema is identified. No motor or sensory levels are noted. Crude visual fields are within normal range.  RADIOLOGY RESULTS: No current films to review  PLAN: Present time patient is doing well from a pulmonary standpoint.  He is scheduled for rib right hemicolectomy early next week possible evaluation of his 1 cm enhancing liver lesion at that  time.  At some point we may decide to do PCI.  He will continue on chemotherapy under Dr. Sharmaine Base direction and have follow-up scans in the future.  I have asked to see him back in 3 months for follow-up.  Patient knows to call with any concerns.  I would like to take this opportunity to thank you for allowing me to participate in the care of your patient.Noreene Filbert, MD

## 2019-04-29 NOTE — Patient Instructions (Signed)
Your procedure is scheduled on: 05-04-19 MONDAY Report to Same Day Surgery 2nd floor medical mall [facility] Entrance-take elevator on left to 2nd floor.  Check in with surgery information desk.) To find out your arrival time please call [phone] between 1PM - 3PM on 05-01-19 FRIDAY  Remember: Instructions that are not followed completely may result in serious medical risk, up to and including death, or upon the discretion of your surgeon and anesthesiologist your surgery may need to be rescheduled.    _x___ 1. Do not eat food after midnight the night before your procedure. NO GUM OR CANDY AFTER MIDNIGHT. You may drink WATER up to 2 hours before you are scheduled to arrive at the hospital for your procedure.  Do not drink WATER  within 2 hours of your scheduled arrival to the hospital.  Type 1 and type 2 diabetics should only drink water.   ____Ensure clear carbohydrate drink on the way to the hospital for bariatric patients  ____Ensure clear carbohydrate drink 3 hours before surgery.     __x__ 2. No Alcohol for 24 hours before or after surgery.   __x__3. No Smoking or e-cigarettes for 24 prior to surgery.  Do not use any chewable tobacco products for at least 6 hour prior to surgery   ____  4. Bring all medications with you on the day of surgery if instructed.    __x__ 5. Notify your doctor if there is any change in your medical condition     (cold, fever, infections).    x___6. On the morning of surgery brush your teeth with toothpaste and water.  You may rinse your mouth with mouth wash if you wish.  Do not swallow any toothpaste or mouthwash.   Do not wear jewelry, make-up, hairpins, clips or nail polish.  Do not wear lotions, powders, or perfumes  Do not shave 48 hours prior to surgery. Men may shave face and neck.  Do not bring valuables to the hospital.    [facility] is not responsible for any belongings or valuables.               Contacts, dentures or bridgework may  not be worn into surgery.  Leave your suitcase in the car. After surgery it may be brought to your room.  For patients admitted to the hospital, discharge time is determined by your treatment team.  _  Patients discharged the day of surgery will not be allowed to drive home.  You will need someone to drive you home and stay with you the night of your procedure.    Please read over the following fact sheets that you were given:   [facility] Preparing for Surgery   _x___ TAKE THE FOLLOWING MEDICATION THE MORNING OF SURGERY WITH A SMALL SIP OF WATER. These include:  1. NORVASC (AMLODIPINE)  2. GABAPENTIN (NEURONTIN)  3. CYMBALTA (DULOXETINE)  4. PRILOSEC (OMEPRAZOLE)  5. TAKE AN EXTRA PRILOSEC THE NIGHT BEFORE YOUR SURGERY  6.  _X___Fleets enema as directed-DO FLEET ENEMA AT HOME THE NIGHT BEFORE YOUR SURGERY AND ANOTHER FLEET ENEMA AT HOME THE MORNING OF SURGERY (1 HOUR PRIOR TO ARRIVAL TIME)   _x___ Use CHG Soap or sage wipes as directed on instruction sheet   _X___ Use inhalers on the day of surgery-USE YOUR SYMBICORT INHALER AM OF SURGERY  _X___ Stop Metformin 2 days prior to surgery-LAST DOSE ON Friday 05-01-19   ____ Take 1/2 of usual insulin dose the night before surgery and none  on the morning surgery.   ____ Follow recommendations from Cardiologist, Pulmonologist or PCP regarding stopping Aspirin, Coumadin, Plavix ,Eliquis, Effient, or Pradaxa, and Pletal.  X____Stop Anti-inflammatories such as Advil, Aleve, Ibuprofen, Motrin, Naproxen, Naprosyn, Goodies powders or aspirin products. OK to take Tylenol OR TRAMADOL IF NEEDED   ____ Stop supplements until after surgery.     ____ Bring C-Pap to the hospital.

## 2019-04-30 ENCOUNTER — Other Ambulatory Visit: Payer: Self-pay

## 2019-04-30 ENCOUNTER — Inpatient Hospital Stay: Payer: BC Managed Care – PPO

## 2019-04-30 ENCOUNTER — Other Ambulatory Visit
Admission: RE | Admit: 2019-04-30 | Discharge: 2019-04-30 | Disposition: A | Discharge status: Home / Self Care | Payer: BC Managed Care – PPO | Source: Ambulatory Visit | Attending: General Surgery | Admitting: General Surgery

## 2019-04-30 VITALS — BP 123/59 | HR 76 | Temp 97.5°F | Resp 18

## 2019-04-30 DIAGNOSIS — D509 Iron deficiency anemia, unspecified: Secondary | ICD-10-CM | POA: Diagnosis not present

## 2019-04-30 DIAGNOSIS — Z01812 Encounter for preprocedural laboratory examination: Secondary | ICD-10-CM | POA: Insufficient documentation

## 2019-04-30 DIAGNOSIS — Z20828 Contact with and (suspected) exposure to other viral communicable diseases: Secondary | ICD-10-CM | POA: Insufficient documentation

## 2019-04-30 DIAGNOSIS — C781 Secondary malignant neoplasm of mediastinum: Secondary | ICD-10-CM

## 2019-04-30 DIAGNOSIS — E611 Iron deficiency: Secondary | ICD-10-CM

## 2019-04-30 LAB — SARS CORONAVIRUS 2 (TAT 6-24 HRS): SARS Coronavirus 2: NEGATIVE

## 2019-04-30 MED ORDER — FAMOTIDINE IN NACL 20-0.9 MG/50ML-% IV SOLN
20.0000 mg | Freq: Once | INTRAVENOUS | Status: AC
  Administered 2019-04-30: 20 mg via INTRAVENOUS
  Filled 2019-04-30: qty 50

## 2019-04-30 MED ORDER — SODIUM CHLORIDE 0.9 % IV SOLN
Freq: Once | INTRAVENOUS | Status: AC
  Administered 2019-04-30: 14:00:00 via INTRAVENOUS
  Filled 2019-04-30: qty 250

## 2019-04-30 MED ORDER — SODIUM CHLORIDE 0.9 % IV SOLN
200.0000 mg | Freq: Once | INTRAVENOUS | Status: AC
  Administered 2019-04-30: 200 mg via INTRAVENOUS
  Filled 2019-04-30: qty 10

## 2019-04-30 MED ORDER — ACETAMINOPHEN 325 MG PO TABS
650.0000 mg | ORAL_TABLET | Freq: Once | ORAL | Status: AC
  Administered 2019-04-30: 650 mg via ORAL
  Filled 2019-04-30: qty 2

## 2019-04-30 MED ORDER — DIPHENHYDRAMINE HCL 25 MG PO CAPS
50.0000 mg | ORAL_CAPSULE | Freq: Once | ORAL | Status: AC
  Administered 2019-04-30: 50 mg via ORAL
  Filled 2019-04-30: qty 2

## 2019-04-30 MED ORDER — HEPARIN SOD (PORK) LOCK FLUSH 100 UNIT/ML IV SOLN
500.0000 [IU] | Freq: Once | INTRAVENOUS | Status: AC | PRN
  Administered 2019-04-30: 500 [IU]

## 2019-04-30 MED ORDER — METHYLPREDNISOLONE SODIUM SUCC 125 MG IJ SOLR
125.0000 mg | Freq: Once | INTRAMUSCULAR | Status: AC
  Administered 2019-04-30: 125 mg via INTRAVENOUS
  Filled 2019-04-30: qty 2

## 2019-04-30 NOTE — Progress Notes (Signed)
1345: Per Dr. Rogue Bussing pt to receive Venofer infusion over one hour.   1550: Pt tolerated infusion well. Pt denies any concerns or complaints at this time. Pt educated to call clinic for any concerns or report to ER/call911 for an emgergency. Pt and VS stable at discharge.

## 2019-04-30 NOTE — Pre-Procedure Instructions (Signed)
CALLED SYLVAN TO CHECK ON CLEARANCE. PT IS COMING IN TO OFFICE TODAY TO BE SEEN FOR CLEARANCE

## 2019-05-03 MED ORDER — CIPROFLOXACIN IN D5W 400 MG/200ML IV SOLN
400.0000 mg | INTRAVENOUS | Status: AC
  Administered 2019-05-04: 400 mg via INTRAVENOUS

## 2019-05-03 MED ORDER — METRONIDAZOLE IN NACL 5-0.79 MG/ML-% IV SOLN
500.0000 mg | INTRAVENOUS | Status: AC
  Administered 2019-05-04: 500 mg via INTRAVENOUS
  Filled 2019-05-03: qty 100

## 2019-05-04 ENCOUNTER — Inpatient Hospital Stay: Payer: BC Managed Care – PPO | Admitting: Certified Registered"

## 2019-05-04 ENCOUNTER — Encounter
Admission: RE | Disposition: A | Discharge status: Home / Self Care | Payer: Self-pay | Source: Ambulatory Visit | Attending: General Surgery

## 2019-05-04 ENCOUNTER — Other Ambulatory Visit: Payer: Self-pay

## 2019-05-04 ENCOUNTER — Inpatient Hospital Stay
Admission: RE | Admit: 2019-05-04 | Discharge: 2019-05-07 | DRG: 330 | Disposition: A | Discharge status: Home / Self Care | Payer: BC Managed Care – PPO | Source: Ambulatory Visit | Attending: General Surgery | Admitting: General Surgery

## 2019-05-04 DIAGNOSIS — C781 Secondary malignant neoplasm of mediastinum: Secondary | ICD-10-CM

## 2019-05-04 DIAGNOSIS — Z23 Encounter for immunization: Secondary | ICD-10-CM | POA: Diagnosis not present

## 2019-05-04 DIAGNOSIS — F329 Major depressive disorder, single episode, unspecified: Secondary | ICD-10-CM | POA: Diagnosis present

## 2019-05-04 DIAGNOSIS — F17210 Nicotine dependence, cigarettes, uncomplicated: Secondary | ICD-10-CM | POA: Diagnosis present

## 2019-05-04 DIAGNOSIS — Z923 Personal history of irradiation: Secondary | ICD-10-CM

## 2019-05-04 DIAGNOSIS — K5900 Constipation, unspecified: Secondary | ICD-10-CM | POA: Diagnosis present

## 2019-05-04 DIAGNOSIS — Z79899 Other long term (current) drug therapy: Secondary | ICD-10-CM

## 2019-05-04 DIAGNOSIS — Z880 Allergy status to penicillin: Secondary | ICD-10-CM

## 2019-05-04 DIAGNOSIS — I10 Essential (primary) hypertension: Secondary | ICD-10-CM | POA: Diagnosis present

## 2019-05-04 DIAGNOSIS — Z885 Allergy status to narcotic agent status: Secondary | ICD-10-CM

## 2019-05-04 DIAGNOSIS — J45909 Unspecified asthma, uncomplicated: Secondary | ICD-10-CM | POA: Diagnosis present

## 2019-05-04 DIAGNOSIS — E1140 Type 2 diabetes mellitus with diabetic neuropathy, unspecified: Secondary | ICD-10-CM | POA: Diagnosis present

## 2019-05-04 DIAGNOSIS — Z9221 Personal history of antineoplastic chemotherapy: Secondary | ICD-10-CM | POA: Diagnosis not present

## 2019-05-04 DIAGNOSIS — Z801 Family history of malignant neoplasm of trachea, bronchus and lung: Secondary | ICD-10-CM | POA: Diagnosis not present

## 2019-05-04 DIAGNOSIS — C189 Malignant neoplasm of colon, unspecified: Secondary | ICD-10-CM | POA: Diagnosis present

## 2019-05-04 DIAGNOSIS — Z9049 Acquired absence of other specified parts of digestive tract: Secondary | ICD-10-CM

## 2019-05-04 DIAGNOSIS — K219 Gastro-esophageal reflux disease without esophagitis: Secondary | ICD-10-CM | POA: Diagnosis present

## 2019-05-04 DIAGNOSIS — C182 Malignant neoplasm of ascending colon: Secondary | ICD-10-CM

## 2019-05-04 DIAGNOSIS — Z881 Allergy status to other antibiotic agents status: Secondary | ICD-10-CM

## 2019-05-04 DIAGNOSIS — Z87442 Personal history of urinary calculi: Secondary | ICD-10-CM

## 2019-05-04 DIAGNOSIS — C3490 Malignant neoplasm of unspecified part of unspecified bronchus or lung: Secondary | ICD-10-CM | POA: Diagnosis present

## 2019-05-04 DIAGNOSIS — Z7984 Long term (current) use of oral hypoglycemic drugs: Secondary | ICD-10-CM

## 2019-05-04 HISTORY — PX: COLOSTOMY REVISION: SHX5232

## 2019-05-04 LAB — CBC
HCT: 30.8 % — ABNORMAL LOW (ref 39.0–52.0)
Hemoglobin: 9.5 g/dL — ABNORMAL LOW (ref 13.0–17.0)
MCH: 26.4 pg (ref 26.0–34.0)
MCHC: 30.8 g/dL (ref 30.0–36.0)
MCV: 85.6 fL (ref 80.0–100.0)
Platelets: 185 10*3/uL (ref 150–400)
RBC: 3.6 MIL/uL — ABNORMAL LOW (ref 4.22–5.81)
RDW: 17.4 % — ABNORMAL HIGH (ref 11.5–15.5)
WBC: 5.7 10*3/uL (ref 4.0–10.5)
nRBC: 0 % (ref 0.0–0.2)

## 2019-05-04 LAB — HEMOGLOBIN A1C
Hgb A1c MFr Bld: 7.9 % — ABNORMAL HIGH (ref 4.8–5.6)
Mean Plasma Glucose: 180.03 mg/dL

## 2019-05-04 LAB — CREATININE, SERUM
Creatinine, Ser: 0.95 mg/dL (ref 0.61–1.24)
GFR calc Af Amer: >60 mL/min (ref >60)
GFR calc non Af Amer: >60 mL/min (ref >60)

## 2019-05-04 LAB — GLUCOSE, CAPILLARY
Glucose-Capillary: 229 mg/dL — ABNORMAL HIGH (ref 70–99)
Glucose-Capillary: 216 mg/dL — ABNORMAL HIGH (ref 70–99)
Glucose-Capillary: 202 mg/dL — ABNORMAL HIGH (ref 70–99)
Glucose-Capillary: 317 mg/dL — ABNORMAL HIGH (ref 70–99)

## 2019-05-04 SURGERY — COLECTOMY, RIGHT
Anesthesia: General | Laterality: Right

## 2019-05-04 MED ORDER — ACETAMINOPHEN 500 MG PO TABS
1000.0000 mg | ORAL_TABLET | Freq: Four times a day (QID) | ORAL | Status: DC
  Administered 2019-05-04 – 2019-05-07 (×11): 1000 mg via ORAL
  Filled 2019-05-04 (×11): qty 2

## 2019-05-04 MED ORDER — BUPIVACAINE LIPOSOME 1.3 % IJ SUSP
INTRAMUSCULAR | Status: AC
  Filled 2019-05-04: qty 20

## 2019-05-04 MED ORDER — SODIUM CHLORIDE 0.9 % IV SOLN
INTRAVENOUS | Status: DC
  Administered 2019-05-04 – 2019-05-05 (×4): via INTRAVENOUS

## 2019-05-04 MED ORDER — KETOROLAC TROMETHAMINE 15 MG/ML IJ SOLN: 15.0000 mg | Freq: Four times a day (QID) | INTRAMUSCULAR | Status: DC | PRN

## 2019-05-04 MED ORDER — ROCURONIUM BROMIDE 100 MG/10ML IV SOLN
INTRAVENOUS | Status: DC | PRN
  Administered 2019-05-04: 10 mg via INTRAVENOUS
  Administered 2019-05-04: 50 mg via INTRAVENOUS

## 2019-05-04 MED ORDER — DOCUSATE SODIUM 100 MG PO CAPS
100.0000 mg | ORAL_CAPSULE | Freq: Two times a day (BID) | ORAL | Status: DC
  Administered 2019-05-04 – 2019-05-07 (×6): 100 mg via ORAL
  Filled 2019-05-04 (×6): qty 1

## 2019-05-04 MED ORDER — BUPIVACAINE HCL (PF) 0.25 % IJ SOLN
INTRAMUSCULAR | Status: AC
  Filled 2019-05-04: qty 30

## 2019-05-04 MED ORDER — ONDANSETRON HCL 4 MG/2ML IJ SOLN: 4.0000 mg | Freq: Once | INTRAMUSCULAR | Status: DC | PRN

## 2019-05-04 MED ORDER — GABAPENTIN 300 MG PO CAPS
600.0000 mg | ORAL_CAPSULE | Freq: Three times a day (TID) | ORAL | Status: DC
  Administered 2019-05-04 – 2019-05-07 (×8): 600 mg via ORAL
  Filled 2019-05-04 (×8): qty 2

## 2019-05-04 MED ORDER — LIDOCAINE HCL (CARDIAC) PF 100 MG/5ML IV SOSY
PREFILLED_SYRINGE | INTRAVENOUS | Status: DC | PRN
  Administered 2019-05-04: 60 mg via INTRAVENOUS

## 2019-05-04 MED ORDER — FENTANYL CITRATE (PF) 250 MCG/5ML IJ SOLN
INTRAMUSCULAR | Status: AC
  Filled 2019-05-04: qty 5

## 2019-05-04 MED ORDER — FLEET ENEMA 7-19 GM/118ML RE ENEM: 1.0000 | ENEMA | Freq: Once | RECTAL | Status: DC

## 2019-05-04 MED ORDER — PRAZOSIN HCL 1 MG PO CAPS
1.0000 mg | ORAL_CAPSULE | Freq: Every day | ORAL | Status: DC
  Administered 2019-05-04 – 2019-05-06 (×3): 1 mg via ORAL
  Filled 2019-05-04 (×4): qty 1

## 2019-05-04 MED ORDER — FLUTICASONE FUROATE-VILANTEROL 200-25 MCG/INH IN AEPB
1.0000 | INHALATION_SPRAY | Freq: Every day | RESPIRATORY_TRACT | Status: DC
  Administered 2019-05-04 – 2019-05-07 (×4): 1 via RESPIRATORY_TRACT
  Filled 2019-05-04: qty 28

## 2019-05-04 MED ORDER — SODIUM CHLORIDE 0.9% IV SOLUTION: 250.0000 mL | Freq: Once | INTRAVENOUS | Status: DC

## 2019-05-04 MED ORDER — SODIUM CHLORIDE (PF) 0.9 % IJ SOLN
INTRAMUSCULAR | Status: AC
  Filled 2019-05-04: qty 50

## 2019-05-04 MED ORDER — CELECOXIB 200 MG PO CAPS
ORAL_CAPSULE | ORAL | Status: AC
  Administered 2019-05-04: 18:00:00
  Filled 2019-05-04: qty 1

## 2019-05-04 MED ORDER — CELECOXIB 200 MG PO CAPS
200.0000 mg | ORAL_CAPSULE | ORAL | Status: AC
  Administered 2019-05-04: 200 mg via ORAL

## 2019-05-04 MED ORDER — INSULIN ASPART 100 UNIT/ML ~~LOC~~ SOLN
0.0000 [IU] | Freq: Three times a day (TID) | SUBCUTANEOUS | Status: DC
  Administered 2019-05-05: 3 [IU] via SUBCUTANEOUS
  Filled 2019-05-04: qty 1

## 2019-05-04 MED ORDER — MIDAZOLAM HCL 2 MG/2ML IJ SOLN
INTRAMUSCULAR | Status: DC | PRN
  Administered 2019-05-04: 2 mg via INTRAVENOUS

## 2019-05-04 MED ORDER — PHENYLEPHRINE HCL (PRESSORS) 10 MG/ML IV SOLN
INTRAVENOUS | Status: DC | PRN
  Administered 2019-05-04: 200 ug via INTRAVENOUS
  Administered 2019-05-04 (×2): 100 ug via INTRAVENOUS
  Administered 2019-05-04: 200 ug via INTRAVENOUS
  Administered 2019-05-04 (×2): 100 ug via INTRAVENOUS
  Administered 2019-05-04 (×2): 200 ug via INTRAVENOUS
  Administered 2019-05-04 (×2): 100 ug via INTRAVENOUS

## 2019-05-04 MED ORDER — ONDANSETRON HCL 4 MG/2ML IJ SOLN: 4.0000 mg | Freq: Four times a day (QID) | INTRAMUSCULAR | Status: DC | PRN

## 2019-05-04 MED ORDER — BUPIVACAINE LIPOSOME 1.3 % IJ SUSP: 20.0000 mL | Freq: Once | INTRAMUSCULAR | Status: DC

## 2019-05-04 MED ORDER — SUGAMMADEX SODIUM 200 MG/2ML IV SOLN
INTRAVENOUS | Status: DC | PRN
  Administered 2019-05-04: 200 mg via INTRAVENOUS

## 2019-05-04 MED ORDER — ALVIMOPAN 12 MG PO CAPS
12.0000 mg | ORAL_CAPSULE | Freq: Two times a day (BID) | ORAL | Status: DC
  Administered 2019-05-05: 12 mg via ORAL
  Filled 2019-05-04 (×2): qty 1

## 2019-05-04 MED ORDER — HYDROMORPHONE HCL 1 MG/ML IJ SOLN
INTRAMUSCULAR | Status: AC
  Filled 2019-05-04: qty 1

## 2019-05-04 MED ORDER — CELECOXIB 200 MG PO CAPS
200.0000 mg | ORAL_CAPSULE | Freq: Two times a day (BID) | ORAL | Status: DC
  Administered 2019-05-04 – 2019-05-07 (×6): 200 mg via ORAL
  Filled 2019-05-04 (×7): qty 1

## 2019-05-04 MED ORDER — FENTANYL CITRATE (PF) 100 MCG/2ML IJ SOLN
INTRAMUSCULAR | Status: DC | PRN
  Administered 2019-05-04 (×2): 50 ug via INTRAVENOUS
  Administered 2019-05-04: 100 ug via INTRAVENOUS
  Administered 2019-05-04: 50 ug via INTRAVENOUS

## 2019-05-04 MED ORDER — TRAMADOL HCL 50 MG PO TABS: 50.0000 mg | ORAL_TABLET | Freq: Four times a day (QID) | ORAL | Status: DC | PRN

## 2019-05-04 MED ORDER — METHOCARBAMOL 500 MG PO TABS
500.0000 mg | ORAL_TABLET | Freq: Four times a day (QID) | ORAL | Status: DC | PRN
  Administered 2019-05-06: 500 mg via ORAL
  Filled 2019-05-04 (×2): qty 1

## 2019-05-04 MED ORDER — ENOXAPARIN SODIUM 40 MG/0.4ML ~~LOC~~ SOLN
40.0000 mg | Freq: Once | SUBCUTANEOUS | Status: AC
  Administered 2019-05-04: 40 mg via SUBCUTANEOUS
  Filled 2019-05-04: qty 0.4

## 2019-05-04 MED ORDER — ACETAMINOPHEN 500 MG PO TABS
1000.0000 mg | ORAL_TABLET | ORAL | Status: AC
  Administered 2019-05-04: 1000 mg via ORAL

## 2019-05-04 MED ORDER — PROPOFOL 10 MG/ML IV BOLUS
INTRAVENOUS | Status: DC | PRN
  Administered 2019-05-04: 100 mg via INTRAVENOUS

## 2019-05-04 MED ORDER — PANTOPRAZOLE SODIUM 40 MG PO TBEC
40.0000 mg | DELAYED_RELEASE_TABLET | Freq: Every day | ORAL | Status: DC
  Administered 2019-05-05 – 2019-05-07 (×3): 40 mg via ORAL
  Filled 2019-05-04 (×3): qty 1

## 2019-05-04 MED ORDER — INFLUENZA VAC A&B SA ADJ QUAD 0.5 ML IM PRSY
0.5000 mL | PREFILLED_SYRINGE | INTRAMUSCULAR | Status: AC
  Administered 2019-05-05: 0.5 mL via INTRAMUSCULAR
  Filled 2019-05-04 (×2): qty 0.5

## 2019-05-04 MED ORDER — LACTATED RINGERS IV SOLN
INTRAVENOUS | Status: DC | PRN
  Administered 2019-05-04: 13:00:00 via INTRAVENOUS

## 2019-05-04 MED ORDER — MIDAZOLAM HCL 2 MG/2ML IJ SOLN
INTRAMUSCULAR | Status: AC
  Filled 2019-05-04: qty 2

## 2019-05-04 MED ORDER — GABAPENTIN 300 MG PO CAPS: 300.0000 mg | ORAL_CAPSULE | Freq: Two times a day (BID) | ORAL | Status: DC

## 2019-05-04 MED ORDER — FENTANYL CITRATE (PF) 100 MCG/2ML IJ SOLN
25.0000 ug | INTRAMUSCULAR | Status: DC | PRN
  Administered 2019-05-04 (×3): 25 ug via INTRAVENOUS

## 2019-05-04 MED ORDER — BUPIVACAINE HCL 0.25 % IJ SOLN
INTRAMUSCULAR | Status: DC | PRN
  Administered 2019-05-04: 30 mL

## 2019-05-04 MED ORDER — OXYCODONE HCL 5 MG PO TABS
5.0000 mg | ORAL_TABLET | ORAL | Status: DC | PRN
  Administered 2019-05-04 – 2019-05-06 (×8): 10 mg via ORAL
  Filled 2019-05-04 (×8): qty 2

## 2019-05-04 MED ORDER — SODIUM CHLORIDE 0.9 % IV SOLN
INTRAVENOUS | Status: DC | PRN
  Administered 2019-05-04: 70 mL

## 2019-05-04 MED ORDER — ONDANSETRON 4 MG PO TBDP: 4.0000 mg | ORAL_TABLET | Freq: Four times a day (QID) | ORAL | Status: DC | PRN

## 2019-05-04 MED ORDER — FENTANYL CITRATE (PF) 100 MCG/2ML IJ SOLN
INTRAMUSCULAR | Status: AC
  Administered 2019-05-04: 25 ug via INTRAVENOUS
  Filled 2019-05-04: qty 2

## 2019-05-04 MED ORDER — PROCHLORPERAZINE MALEATE 10 MG PO TABS
10.0000 mg | ORAL_TABLET | Freq: Four times a day (QID) | ORAL | Status: DC | PRN
  Filled 2019-05-04: qty 1

## 2019-05-04 MED ORDER — AMLODIPINE BESYLATE 10 MG PO TABS
10.0000 mg | ORAL_TABLET | ORAL | Status: DC
  Administered 2019-05-05 – 2019-05-07 (×3): 10 mg via ORAL
  Filled 2019-05-04 (×3): qty 1

## 2019-05-04 MED ORDER — CHLORHEXIDINE GLUCONATE CLOTH 2 % EX PADS: 6.0000 | MEDICATED_PAD | Freq: Every day | CUTANEOUS | Status: DC

## 2019-05-04 MED ORDER — CHLORHEXIDINE GLUCONATE CLOTH 2 % EX PADS
6.0000 | MEDICATED_PAD | Freq: Once | CUTANEOUS | Status: AC
  Administered 2019-05-04: 6 via TOPICAL

## 2019-05-04 MED ORDER — ACETAMINOPHEN 500 MG PO TABS
ORAL_TABLET | ORAL | Status: AC
  Administered 2019-05-04: 18:00:00
  Filled 2019-05-04: qty 2

## 2019-05-04 MED ORDER — ONDANSETRON HCL 4 MG/2ML IJ SOLN
INTRAMUSCULAR | Status: DC | PRN
  Administered 2019-05-04: 4 mg via INTRAVENOUS

## 2019-05-04 MED ORDER — ALVIMOPAN 12 MG PO CAPS
ORAL_CAPSULE | ORAL | Status: AC
  Administered 2019-05-04: 18:00:00
  Filled 2019-05-04: qty 1

## 2019-05-04 MED ORDER — ESMOLOL HCL 100 MG/10ML IV SOLN
INTRAVENOUS | Status: DC | PRN
  Administered 2019-05-04: 20 mg via INTRAVENOUS

## 2019-05-04 MED ORDER — CIPROFLOXACIN IN D5W 400 MG/200ML IV SOLN
INTRAVENOUS | Status: AC
  Filled 2019-05-04: qty 200

## 2019-05-04 MED ORDER — SODIUM CHLORIDE 0.9 % IV SOLN
INTRAVENOUS | Status: DC
  Administered 2019-05-04: 50 mL/h via INTRAVENOUS

## 2019-05-04 MED ORDER — ALBUTEROL SULFATE HFA 108 (90 BASE) MCG/ACT IN AERS
INHALATION_SPRAY | RESPIRATORY_TRACT | Status: AC
  Filled 2019-05-04: qty 6.7

## 2019-05-04 MED ORDER — PROCHLORPERAZINE EDISYLATE 10 MG/2ML IJ SOLN
5.0000 mg | Freq: Four times a day (QID) | INTRAMUSCULAR | Status: DC | PRN
  Filled 2019-05-04: qty 2

## 2019-05-04 MED ORDER — CHLORHEXIDINE GLUCONATE CLOTH 2 % EX PADS: 6.0000 | MEDICATED_PAD | Freq: Once | CUTANEOUS | Status: DC

## 2019-05-04 MED ORDER — ENOXAPARIN SODIUM 40 MG/0.4ML ~~LOC~~ SOLN
40.0000 mg | SUBCUTANEOUS | Status: DC
  Administered 2019-05-05 – 2019-05-07 (×3): 40 mg via SUBCUTANEOUS
  Filled 2019-05-04 (×2): qty 0.4

## 2019-05-04 MED ORDER — PNEUMOCOCCAL VAC POLYVALENT 25 MCG/0.5ML IJ INJ
0.5000 mL | INJECTION | INTRAMUSCULAR | Status: AC
  Administered 2019-05-05: 0.5 mL via INTRAMUSCULAR
  Filled 2019-05-04: qty 0.5

## 2019-05-04 MED ORDER — SIMETHICONE 80 MG PO CHEW
40.0000 mg | CHEWABLE_TABLET | Freq: Four times a day (QID) | ORAL | Status: DC | PRN
  Filled 2019-05-04: qty 1

## 2019-05-04 MED ORDER — HYDROMORPHONE HCL 1 MG/ML IJ SOLN: 0.5000 mg | INTRAMUSCULAR | Status: DC | PRN

## 2019-05-04 MED ORDER — GLYCOPYRROLATE 0.2 MG/ML IJ SOLN
INTRAMUSCULAR | Status: DC | PRN
  Administered 2019-05-04 (×2): 0.2 mg via INTRAVENOUS

## 2019-05-04 MED ORDER — HYDROCHLOROTHIAZIDE 25 MG PO TABS
25.0000 mg | ORAL_TABLET | Freq: Every day | ORAL | Status: DC
  Administered 2019-05-05 – 2019-05-07 (×3): 25 mg via ORAL
  Filled 2019-05-04 (×3): qty 1

## 2019-05-04 MED ORDER — DULOXETINE HCL 30 MG PO CPEP
30.0000 mg | ORAL_CAPSULE | ORAL | Status: DC
  Administered 2019-05-05 – 2019-05-07 (×3): 30 mg via ORAL
  Filled 2019-05-04 (×3): qty 1

## 2019-05-04 MED ORDER — ALVIMOPAN 12 MG PO CAPS
12.0000 mg | ORAL_CAPSULE | ORAL | Status: AC
  Administered 2019-05-04: 12 mg via ORAL

## 2019-05-04 SURGICAL SUPPLY — 50 items
BLADE CLIPPER SURG (BLADE) ×2 IMPLANT
CANISTER SUCT 1200ML W/VALVE (MISCELLANEOUS) ×2 IMPLANT
CHLORAPREP W/TINT 26 (MISCELLANEOUS) ×2 IMPLANT
COVER WAND RF STERILE (DRAPES) IMPLANT
DRAPE LAPAROTOMY 100X77 ABD (DRAPES) ×2 IMPLANT
DRSG TELFA 4X14 ISLAND NADH (GAUZE/BANDAGES/DRESSINGS) ×2 IMPLANT
DRSG TELFA 4X8 ISLAND PHMB (GAUZE/BANDAGES/DRESSINGS) IMPLANT
ELECT BLADE 6.5 EXT (BLADE) ×2 IMPLANT
ELECT CAUTERY BLADE TIP 2.5 (TIP) ×2
ELECT REM PT RETURN 9FT ADLT (ELECTROSURGICAL) ×2
ELECTRODE CAUTERY BLDE TIP 2.5 (TIP) ×1 IMPLANT
ELECTRODE REM PT RTRN 9FT ADLT (ELECTROSURGICAL) ×1 IMPLANT
GAUZE SPONGE 4X4 12PLY STRL (GAUZE/BANDAGES/DRESSINGS) IMPLANT
GLOVE BIO SURGEON STRL SZ 6.5 (GLOVE) ×8 IMPLANT
GLOVE INDICATOR 7.0 STRL GRN (GLOVE) ×2 IMPLANT
GOWN STRL REUS W/ TWL LRG LVL3 (GOWN DISPOSABLE) ×7 IMPLANT
GOWN STRL REUS W/TWL LRG LVL3 (GOWN DISPOSABLE) ×7
KIT TURNOVER KIT A (KITS) ×2 IMPLANT
LABEL OR SOLS (LABEL) ×2 IMPLANT
LIGASURE IMPACT 36 18CM CVD LR (INSTRUMENTS) ×2 IMPLANT
NEEDLE HYPO 22GX1.5 SAFETY (NEEDLE) ×4 IMPLANT
NS IRRIG 1000ML POUR BTL (IV SOLUTION) ×4 IMPLANT
PACK BASIN MAJOR ARMC (MISCELLANEOUS) ×2 IMPLANT
PACK COLON CLEAN CLOSURE (MISCELLANEOUS) ×2 IMPLANT
PENCIL SMOKE EVACUATOR (MISCELLANEOUS) ×2 IMPLANT
RELOAD PROXIMATE 75MM BLUE (ENDOMECHANICALS) ×6 IMPLANT
RETRACTOR WND ALEXIS-O 25 LRG (MISCELLANEOUS) ×1 IMPLANT
RTRCTR WOUND ALEXIS O 25CM LRG (MISCELLANEOUS) ×2
SLEEVE SUCTION CATH 165 (SLEEVE) ×2 IMPLANT
SPONGE LAP 18X18 RF (DISPOSABLE) ×4 IMPLANT
STAPLER CUT CVD 40MM BLUE (STAPLE) IMPLANT
STAPLER PROXIMATE 75MM BLUE (STAPLE) ×2 IMPLANT
STAPLER SKIN PROX 35W (STAPLE) ×2 IMPLANT
STRIP CLOSURE SKIN 1/2X4 (GAUZE/BANDAGES/DRESSINGS) IMPLANT
SUT PDS AB 1 TP1 96 (SUTURE) ×4 IMPLANT
SUT PROLENE 0 CT 1 30 (SUTURE) IMPLANT
SUT SILK 2 0 (SUTURE) ×1
SUT SILK 2 0SH CR/8 30 (SUTURE) ×2 IMPLANT
SUT SILK 2-0 30XBRD TIE 12 (SUTURE) ×1 IMPLANT
SUT SILK 3-0 (SUTURE) ×2 IMPLANT
SUT VIC AB 2-0 BRD 54 (SUTURE) IMPLANT
SUT VIC AB 2-0 CT1 27 (SUTURE)
SUT VIC AB 2-0 CT1 TAPERPNT 27 (SUTURE) IMPLANT
SUT VIC AB 3-0 54X BRD REEL (SUTURE) ×1 IMPLANT
SUT VIC AB 3-0 BRD 54 (SUTURE) ×1
SUT VIC AB 3-0 SH 27 (SUTURE)
SUT VIC AB 3-0 SH 27X BRD (SUTURE) IMPLANT
SYR 20ML LL LF (SYRINGE) ×2 IMPLANT
SYR 30ML LL (SYRINGE) ×2 IMPLANT
TRAY FOLEY MTR SLVR 16FR STAT (SET/KITS/TRAYS/PACK) ×2 IMPLANT

## 2019-05-04 NOTE — Progress Notes (Signed)
Blood sugar 216  Dr Andree Elk aware  No new orders

## 2019-05-04 NOTE — Anesthesia Post-op Follow-up Note (Signed)
Anesthesia QCDR form completed.        

## 2019-05-04 NOTE — H&P (Addendum)
presents with  . Colon Cancer    HPI Kevin Mckay is a 68 y.o. male.  He has been referred by Dr. Rogue Bussing for surgical evaluation of colon cancer identified on recent colonoscopy.  Kevin Mckay has been undergoing chemotherapy and radiation for metastatic small cell lung cancer.  He has had persistent issues with dropping hemoglobin.  His treatments have had to be deferred occasionally, secondary to low blood counts.  He has had melenic stools.  He recently underwent colonoscopy with Dr. Jonathon Bellows to try and better evaluate the source of blood loss.  A cecal mass was identified.  Biopsy demonstrated invasive adenocarcinoma.  He has had a recent chest CT (June 2020), but no CT scan of the abdomen or pelvis.  He reports having black and green formed stools.  He endorses constipation, having bowel movements 1-2 times per week, but is on Linzess and Metamucil to try and help with his constipation.  He reports having GERD and "lots of rumbling and grumbling" in his after abdomen.  He denies any pain in his abdomen but states that he does feel somewhat uncomfortable.  His appetite is reported as good.  There is no reported history of colon cancer in his family.  Of note, approximately 40 years ago he was involved in a forklift accident that resulted in a trauma laparotomy with resection of part of his liver and pancreas (notes state spleen).       Past Medical History:  Diagnosis Date  . Asthma   . Cancer ([place])    Metastatic small cell lung cancer  . Cerebral aneurysm   . Chronic painful diabetic neuropathy ([place])   . Depression   . Diabetes mellitus without complication ([place])   . Essential hypertension   . History of kidney stones   . Occasional tremors   . Tobacco use          Past Surgical History:  Procedure Laterality Date  . ABDOMINAL SURGERY     age 75. trauma surgery due to forklift injury- liver and spleen  . COLONOSCOPY WITH PROPOFOL N/A 03/27/2019    Procedure: COLONOSCOPY WITH PROPOFOL;  Surgeon: Jonathon Bellows, MD;  Location: [facility] ENDOSCOPY;  Service: Gastroenterology;  Laterality: N/A;  . ESOPHAGOGASTRODUODENOSCOPY (EGD) WITH PROPOFOL N/A 03/27/2019   Procedure: ESOPHAGOGASTRODUODENOSCOPY (EGD) WITH PROPOFOL;  Surgeon: Jonathon Bellows, MD;  Location: [facility] ENDOSCOPY;  Service: Gastroenterology;  Laterality: N/A;  . IR GENERIC HISTORICAL  05/31/2016   IR ANGIO VERTEBRAL SEL VERTEBRAL BILAT MOD SED 05/31/2016 Consuella Lose, MD [facility]-INTERV RAD  . IR GENERIC HISTORICAL  05/31/2016   IR ANGIO INTRA EXTRACRAN SEL INTERNAL CAROTID BILAT MOD SED 05/31/2016 Consuella Lose, MD [facility]INTER[facility]  . PORTACATH PLACEMENT Right 02/04/2019   Procedure: INSERTION PORT-A-CATH;  Surgeon: [name], MD;  Location: [facility] ORS;  Service: General;  Laterality: Right;  . THORACOTOMY Left 01/22/2019   Procedure: PRE OP BRONCH LEFT ANTERIOR THORACOTOMY WITH BIOPSY OF MEDIASTINAL MASS;  Surgeon: [name], MD;  Location: [facility] ORS;  Service: General;  Laterality: Left;         Family History  Problem Relation Age of Onset  . Lymphoma Father   . Liver cancer Paternal Uncle   . Lung cancer Paternal Uncle   . Lung cancer Maternal Uncle     Social History Social History        Tobacco Use  . Smoking status: Current Every Day Smoker    Packs/day: 0.50    Years: 50.00    Pack years:  25.00    Types: Cigarettes  . Smokeless tobacco: Never Used  Substance Use Topics  . Alcohol use: No  . Drug use: Yes    Types: Barbituates, Marijuana         Allergies  Allergen Reactions  . Ferumoxytol     Infusion reaction-shortness of breath/ tachycardia/hypotension.  Marland Kitchen Amoxicillin Hives  . Codeine Nausea Only          Current Outpatient Medications  Medication Sig Dispense Refill  . amLODipine (NORVASC) 10 MG tablet Take 10 mg by mouth daily.    . Budesonide-Formoterol Fumarate (SYMBICORT IN) Inhale into the lungs 2 (two) times  daily.     . DULoxetine (CYMBALTA) 30 MG capsule Take 30 mg by mouth daily.    . ferrous sulfate 324 (65 Fe) MG TBEC Take 2 tablets by mouth daily.    Marland Kitchen gabapentin (NEURONTIN) 600 MG tablet Take 600 mg by mouth every 6 (six) hours as needed (pain. Not to exceed 3600 mg).     Marland Kitchen glipiZIDE (GLUCOTROL XL) 10 MG 24 hr tablet Take 10 mg by mouth daily with breakfast.    . hydrochlorothiazide (HYDRODIURIL) 25 MG tablet Take 1 tablet by mouth daily.    Marland Kitchen lidocaine-prilocaine (EMLA) cream Apply 1 application topically as needed. 30-45 mins prior to port access. 30 g 0  . linaCLOtide (LINZESS PO) Take 1 capsule by mouth daily.    Marland Kitchen lovastatin (MEVACOR) 20 MG tablet Take 20 mg by mouth every evening.    . metFORMIN (GLUCOPHAGE) 1000 MG tablet Take 1,000 mg by mouth 2 (two) times daily with a meal.    . Multiple Vitamins-Minerals (MULTIVITAMIN WITH MINERALS) tablet Take 1 tablet by mouth daily.    Marland Kitchen omeprazole (PRILOSEC) 40 MG capsule Take 1 capsule (40 mg total) by mouth daily. 90 capsule 1  . ondansetron (ZOFRAN) 8 MG tablet Take 1 tablet (8 mg total) by mouth every 8 (eight) hours as needed for nausea or vomiting (start 3 days; after chemo). 40 tablet 1  . prazosin (MINIPRESS) 1 MG capsule Take 1 mg by mouth at bedtime.    . prochlorperazine (COMPAZINE) 10 MG tablet Take 1 tablet (10 mg total) by mouth every 6 (six) hours as needed for nausea or vomiting. 40 tablet 1  . psyllium (METAMUCIL) 58.6 % powder Take 1 packet by mouth daily.    . sitaGLIPtin (JANUVIA) 25 MG tablet Take 25 mg by mouth daily.    . sucralfate (CARAFATE) 1 GM/10ML suspension Take 47ml three times daily  30 minutes before meals 420 mL 3  . traMADol (ULTRAM) 50 MG tablet Take 1 tablet (50 mg total) by mouth every 8 (eight) hours as needed. 60 tablet 0  . vitamin B-12 (CYANOCOBALAMIN) 100 MCG tablet Take 100 mcg by mouth daily.     No current facility-administered medications for this visit.    Today's  Vitals   05/04/19 1012  BP: (!) 145/82  Pulse: 70  Resp: 18  Temp: 98.1 F (36.7 C)  TempSrc: Tympanic  SpO2: 97%  Weight: 65.8 kg  Height: 5\' 8"  (1.727 m)  PainSc: 0-No pain   Body mass index is 22.05 kg/m. Gen: A&O x3, NAD HEENT: [place]/AT. Mouth and nose covered with a mask CV: RRR Pulm: normal WOB on RA Abd: soft, NT, scar from prior surgery  Assessment This is a 68 year old man currently undergoing chemotherapy for metastatic small cell lung cancer.  He recently completed radiation therapy.  He has had issues with progressively  dropping hematocrit as well as melenic stools.  The likely source of the bleeding is the cecal mass appreciated on recent colonoscopy.  This is been biopsy-proven to be invasive adenocarcinoma.  Plan I have recommended that Mr. Overdorf undergo surgical resection of his colon cancer. CEA was 2.8 and PAB was over 20.  CT scan and MRI showed a liver lesion, but based upon tumor board discussion, the thought is that this is likely from the lung cancer.  The risks of surgery were discussed with Mr. Lefkowitz and he agreed to proceed.

## 2019-05-04 NOTE — Op Note (Signed)
Operative Note  Preoperative Diagnosis: Cecal mass, biopsy-proven colon cancer  Postoperative Diagnosis: Same  Operation: Right hemicolectomy  Surgeon: Fredirick Maudlin, MD  Assistant: Ardath Sax, MD (a second surgeon was necessary for exposure, technical expertise, and creation of the anastomosis)  Anesthesia: General endotracheal  Findings: There was a well defined cecal mass that did not extend outside the wall of the colon.  There were a number of lymph nodes in the mesentery that were enlarged and firm.  Indications: This is a 68 year old man who has been undergoing treatment for metastatic small cell lung carcinoma.  He was having melenic stools and requiring frequent blood transfusions.  A colonoscopy revealed a cecal mass.  This was biopsied and was consistent with adenocarcinoma.  Due to the ongoing blood loss, surgery was recommended, despite the poor prognosis from his lung cancer.  The risks of the operation were discussed with the patient and he agreed to proceed.  Procedure In Detail: The patient was identified in the preoperative holding area and brought to the operating room where he was placed supine on the OR table.  All bony prominences were padded and bilateral sequential compression devices were placed on the lower extremities.  General endotracheal anesthesia was induced without incident.  A Foley catheter was aseptically placed by the nursing staff.  The patient was then sterilely prepped and draped in standard fashion.  A timeout was performed confirming the patient's identity, the procedure being performed, his allergies, all necessary equipment was available, and that maintenance anesthesia was adequate.  A perioperative dose of antibiotics was administered.  The patient's previous laparotomy incision was opened from just above the umbilicus to just below the umbilicus.  He had wire sutures in place which were cut with a wire cutters and removed.  A finger sweep within  the peritoneum demonstrated some adhesions of the omentum to the abdominal wall but no bowel was involved.  The abdomen was explored and the cecal mass identified.  An Eschbach wound protector was placed.  The right colon was mobilized along the white line of Toldt, up to the hepatic flexure.  We then made a window in the mesentery of the terminal ileum.  A blue load GIA stapler was used to divide the bowel.  The mesentery was divided with the LigaSure.  The right colic vein was oversewn as we came across it.  We continued to divide the mesentery until we neared the hepatic flexure.  After we had obtained a good 5 cm margin on the tumor, we divided the colon with another firing of the GIA stapler.  The antimesenteric sides of the small bowel and colon were brought together to create an side-to-side, functional end-to-end anastomosis.  Stay sutures were placed and the ends of the bowel opened.  The GIA stapler was used to create the anastomosis and the defect was closed with another firing of the stapler.  There was one corner with overlapping staple lines.  This was imbricated with 3-0 silk Lembert suture.  We evaluated our mesenteric defect.  It was very large and there was limited mesentery at the hepatic flexure of the colon to used to reapproximate the small bowel mesentery 2.  We elected to leave it open as it was of such a size that we thought herniation and incarceration to be very unlikely.  We irrigated thoroughly and confirmed good hemostasis.  Exparel was injected along the fascial planes.  The fascia was closed with running #1 looped PDS and the skin was  closed with staples.  A sterile dressing was applied.  The patient was awakened, extubated, and taken to the postanesthesia care unit in good condition.  EBL: 25 cc  IVF: See anesthesia record  Specimen(s): Right colon  Complications: none immediately apparent.   Counts: all needles, instruments, and sponges were counted and reported to be  correct in number at the end of the case.   I was present for and participated in the entire operation.  Fredirick Maudlin 4:08 PM

## 2019-05-04 NOTE — Anesthesia Procedure Notes (Signed)
Procedure Name: Intubation Performed by: Staci Acosta, CRNA Pre-anesthesia Checklist: Patient identified, Patient being monitored, Timeout performed, Emergency Drugs available and Suction available Patient Re-evaluated:Patient Re-evaluated prior to induction Oxygen Delivery Method: Circle system utilized Preoxygenation: Pre-oxygenation with 100% oxygen Induction Type: IV induction Ventilation: Mask ventilation without difficulty Laryngoscope Size: Mac and 3 Grade View: Grade II Tube type: Oral Tube size: 7.0 mm Number of attempts: 1 Airway Equipment and Method: Stylet Placement Confirmation: ETT inserted through vocal cords under direct vision,  positive ETCO2 and breath sounds checked- equal and bilateral Secured at: 21 cm Tube secured with: Tape Dental Injury: Teeth and Oropharynx as per pre-operative assessment

## 2019-05-04 NOTE — Anesthesia Preprocedure Evaluation (Signed)
Anesthesia Evaluation  Patient identified by MRN, date of birth, ID band Patient awake    Reviewed: Allergy & Precautions, NPO status , Patient's Chart, lab work & pertinent test results  History of Anesthesia Complications Negative for: history of anesthetic complications  Airway Mallampati: II  TM Distance: >3 FB Neck ROM: Full    Dental  (+) Poor Dentition, Dental Advidsory Given, Missing   Pulmonary neg shortness of breath, asthma , neg recent URI, Current Smoker and Patient abstained from smoking.,    breath sounds clear to auscultation- rhonchi (-) wheezing      Cardiovascular hypertension, Pt. on medications (-) angina(-) CAD, (-) Past MI, (-) Cardiac Stents and (-) CABG (-) dysrhythmias (-) Valvular Problems/Murmurs Rhythm:Regular Rate:Normal - Systolic murmurs and - Diastolic murmurs    Neuro/Psych neg Seizures PSYCHIATRIC DISORDERS Depression negative neurological ROS     GI/Hepatic negative GI ROS, Neg liver ROS,   Endo/Other  diabetes, Oral Hypoglycemic Agents  Renal/GU negative Renal ROS     Musculoskeletal negative musculoskeletal ROS (+)   Abdominal (+) - obese,   Peds  Hematology negative hematology ROS (+)   Anesthesia Other Findings Past Medical History: No date: Asthma No date: Cerebral aneurysm No date: Chronic painful diabetic neuropathy (H[facility]) No date: Depression No date: Diabetes mellitus without complication ([facility]) No date: Essential hypertension No date: History of kidney stones No date: Occasional tremors No date: Tobacco use   Reproductive/Obstetrics                             Anesthesia Physical  Anesthesia Plan  ASA: III  Anesthesia Plan: General   Post-op Pain Management:    Induction: Intravenous  PONV Risk Score and Plan: 0 and Ondansetron, Dexamethasone, Midazolam and Treatment may vary due to age or medical condition  Airway Management  Planned: Oral ETT  Additional Equipment:   Intra-op Plan:   Post-operative Plan: Extubation in OR  Informed Consent: I have reviewed the patients History and Physical, chart, labs and discussed the procedure including the risks, benefits and alternatives for the proposed anesthesia with the patient or authorized representative who has indicated his/her understanding and acceptance.     Dental advisory given  Plan Discussed with: CRNA and Anesthesiologist  Anesthesia Plan Comments:         Anesthesia Quick Evaluation

## 2019-05-04 NOTE — Transfer of Care (Signed)
Immediate Anesthesia Transfer of Care Note  Patient: Kevin Mckay  Procedure(s) Performed: COLON RESECTION RIGHT-right hemicolectomy-open (Right )  Patient Location: PACU  Anesthesia Type:General  Level of Consciousness: awake, alert  and oriented  Airway & Oxygen Therapy: Patient Spontanous Breathing  Post-op Assessment: Report given to RN and Post -op Vital signs reviewed and stable  Post vital signs: Reviewed and stable  Last Vitals:  Vitals Value Taken Time  BP 125/67 05/04/19 1555  Temp 36.2 C 05/04/19 1555  Pulse 72 05/04/19 1602  Resp 11 05/04/19 1602  SpO2 98 % 05/04/19 1602  Vitals shown include unvalidated device data.  Last Pain:  Vitals:   05/04/19 1555  TempSrc:   PainSc: 0-No pain         Complications: No apparent anesthesia complications

## 2019-05-05 ENCOUNTER — Encounter: Payer: Self-pay | Admitting: General Surgery

## 2019-05-05 LAB — BASIC METABOLIC PANEL
Anion gap: 4 — ABNORMAL LOW (ref 5–15)
BUN: 9 mg/dL (ref 8–23)
CO2: 26 mmol/L (ref 22–32)
Calcium: 7.8 mg/dL — ABNORMAL LOW (ref 8.9–10.3)
Chloride: 105 mmol/L (ref 98–111)
Creatinine, Ser: 0.9 mg/dL (ref 0.61–1.24)
GFR calc Af Amer: >60 mL/min (ref >60)
GFR calc non Af Amer: >60 mL/min (ref >60)
Glucose, Bld: 233 mg/dL — ABNORMAL HIGH (ref 70–99)
Potassium: 4.1 mmol/L (ref 3.5–5.1)
Sodium: 135 mmol/L (ref 135–145)

## 2019-05-05 LAB — CBC
HCT: 25 % — ABNORMAL LOW (ref 39.0–52.0)
Hemoglobin: 8.2 g/dL — ABNORMAL LOW (ref 13.0–17.0)
MCH: 27.2 pg (ref 26.0–34.0)
MCHC: 32.8 g/dL (ref 30.0–36.0)
MCV: 82.8 fL (ref 80.0–100.0)
Platelets: 169 10*3/uL (ref 150–400)
RBC: 3.02 MIL/uL — ABNORMAL LOW (ref 4.22–5.81)
RDW: 17.3 % — ABNORMAL HIGH (ref 11.5–15.5)
WBC: 3.4 10*3/uL — ABNORMAL LOW (ref 4.0–10.5)
nRBC: 0 % (ref 0.0–0.2)

## 2019-05-05 LAB — GLUCOSE, CAPILLARY
Glucose-Capillary: 213 mg/dL — ABNORMAL HIGH (ref 70–99)
Glucose-Capillary: 250 mg/dL — ABNORMAL HIGH (ref 70–99)
Glucose-Capillary: 329 mg/dL — ABNORMAL HIGH (ref 70–99)
Glucose-Capillary: 127 mg/dL — ABNORMAL HIGH (ref 70–99)

## 2019-05-05 LAB — MAGNESIUM: Magnesium: 1.7 mg/dL (ref 1.7–2.4)

## 2019-05-05 LAB — PHOSPHORUS: Phosphorus: 3.5 mg/dL (ref 2.5–4.6)

## 2019-05-05 MED ORDER — INSULIN ASPART 100 UNIT/ML ~~LOC~~ SOLN
0.0000 [IU] | Freq: Every day | SUBCUTANEOUS | Status: DC
  Administered 2019-05-06: 2 [IU] via SUBCUTANEOUS
  Filled 2019-05-05: qty 1

## 2019-05-05 MED ORDER — HYDROMORPHONE HCL 1 MG/ML IJ SOLN
0.5000 mg | INTRAMUSCULAR | Status: DC | PRN
  Administered 2019-05-05 – 2019-05-06 (×3): 0.5 mg via INTRAVENOUS
  Filled 2019-05-05 (×3): qty 1

## 2019-05-05 MED ORDER — INSULIN ASPART 100 UNIT/ML ~~LOC~~ SOLN
0.0000 [IU] | Freq: Three times a day (TID) | SUBCUTANEOUS | Status: DC
  Administered 2019-05-05: 3 [IU] via SUBCUTANEOUS
  Administered 2019-05-05: 7 [IU] via SUBCUTANEOUS
  Administered 2019-05-06: 5 [IU] via SUBCUTANEOUS
  Administered 2019-05-06: 2 [IU] via SUBCUTANEOUS
  Administered 2019-05-07: 3 [IU] via SUBCUTANEOUS
  Filled 2019-05-05 (×5): qty 1

## 2019-05-05 MED ORDER — MAGNESIUM SULFATE 2 GM/50ML IV SOLN
2.0000 g | Freq: Once | INTRAVENOUS | Status: AC
  Administered 2019-05-05: 2 g via INTRAVENOUS
  Filled 2019-05-05: qty 50

## 2019-05-05 NOTE — Progress Notes (Addendum)
[facility] Hospital Day(s): 1.   Post op day(s): 1 Day Post-Op.   Interval History: Patient seen and examined no acute events or new complaints overnight.  Patient reports he is doing well. He does endorse some incisional and right sided soreness. No fever, chills, nausea, or emesis.  Leukopenia - 3.4, Hgb - 8.2 Good U/O On CLD, tolerating well He reports flatus x2-3    Vital signs in last 24 hours: [min-max] current  Temp:  [97.2 F (36.2 C)-98.1 F (36.7 C)] 97.5 F (36.4 C) (11/10 0516) Pulse Rate:  [66-73] 66 (11/10 0516) Resp:  [11-18] 18 (11/10 0516) BP: (110-145)/(56-82) 116/60 (11/10 0516) SpO2:  [93 %-100 %] 93 % (11/10 0516) Weight:  [65.8 kg] 65.8 kg (11/09 1012)     Height: 5\' 8"  (172.7 cm) Weight: 65.8 kg BMI (Calculated): 22.05   Intake/Output last 2 shifts:  11/09 0701 - 11/10 0700 In: 3007.2 [P.O.:600; I.V.:2407.2] Out: 1000 [Urine:975; Blood:25]   Physical Exam:  Constitutional: alert, cooperative and no distress  Respiratory: breathing non-labored at rest  Cardiovascular: regular rate and sinus rhythm  Gastrointestinal: soft, incisional soreness, and non-distended. No rebound/guarding Integumentary: Midline laparotomy incision CDI with dressing in place, no drainage  Labs:  CBC Latest Ref Rng & Units 05/05/2019 05/04/2019 04/28/2019  WBC 4.0 - 10.5 K/uL 3.4(L) 5.7 6.4  Hemoglobin 13.0 - 17.0 g/dL 8.2(L) 9.5(L) 8.9(L)  Hematocrit 39.0 - 52.0 % 25.0(L) 30.8(L) 28.7(L)  Platelets 150 - 400 K/uL 169 185 177   CMP Latest Ref Rng & Units 05/05/2019 05/04/2019 04/28/2019  Glucose 70 - 99 mg/dL 233(H) - 390(H)  BUN 8 - 23 mg/dL 9 - 18  Creatinine 0.61 - 1.24 mg/dL 0.90 0.95 0.98  Sodium 135 - 145 mmol/L 135 - 136  Potassium 3.5 - 5.1 mmol/L 4.1 - 4.4  Chloride 98 - 111 mmol/L 105 - 101  CO2 22 - 32 mmol/L 26 - 23  Calcium 8.9 - 10.3 mg/dL 7.8(L) - 9.1  Total Protein 6.5 - 8.1 g/dL - - 6.7  Total Bilirubin 0.3 -  1.2 mg/dL - - 0.4  Alkaline Phos 38 - 126 U/L - - 69  AST 15 - 41 U/L - - 16  ALT 0 - 44 U/L - - 17     Imaging studies: No new pertinent imaging studies   Assessment/Plan:  68 y.o. male overall doing well 1 Day Post-Op s/p right hemicolectomy for biopsy proven colon cancer of the cecum   - Advance to full liquids diet   - Discontinue Foley Catheter   - pain control prn; antiemetics prn  - monitor abdominal examination; on-going bowel fucntion    - mobilization encouraged  - IS use  - medical management of comorbidities    All of the above findings and recommendations were discussed with the patient, and the medical team, and all of patient's questions were answered to his expressed satisfaction.  -- Edison Simon, PA-C [facility] 05/05/2019, 8:03 AM [phone] M-F: 7am - 4pm   I saw and evaluated the patient.  I agree with the above documentation, exam, and plan, which I have edited where appropriate. Fredirick Maudlin  9:29 AM

## 2019-05-06 LAB — GLUCOSE, CAPILLARY
Glucose-Capillary: 238 mg/dL — ABNORMAL HIGH (ref 70–99)
Glucose-Capillary: 181 mg/dL — ABNORMAL HIGH (ref 70–99)
Glucose-Capillary: 235 mg/dL — ABNORMAL HIGH (ref 70–99)

## 2019-05-06 MED ORDER — LINAGLIPTIN 5 MG PO TABS
5.0000 mg | ORAL_TABLET | Freq: Every day | ORAL | Status: DC
  Filled 2019-05-06: qty 1

## 2019-05-06 MED ORDER — LINAGLIPTIN 5 MG PO TABS
5.0000 mg | ORAL_TABLET | Freq: Every day | ORAL | Status: DC
  Administered 2019-05-06 – 2019-05-07 (×2): 5 mg via ORAL
  Filled 2019-05-06 (×2): qty 1

## 2019-05-06 NOTE — Plan of Care (Signed)
  Problem: Nutrition: Goal: Adequate nutrition will be maintained Outcome: Progressing Patient tolerating advanced diet

## 2019-05-06 NOTE — Progress Notes (Signed)
Inpatient Diabetes Program Recommendations  AACE/ADA: New Consensus Statement on Inpatient Glycemic Control (2015)  Target Ranges:  Prepandial:   less than 140 mg/dL      Peak postprandial:   less than 180 mg/dL (1-2 hours)      Critically ill patients:  140 - 180 mg/dL   Lab Results  Component Value Date   GLUCAP 238 (H) 05/06/2019   HGBA1C 7.9 (H) 05/04/2019    Review of Glycemic Control Results for Kevin Mckay, Kevin Mckay (MRN [id number]) as of 05/06/2019 14:01  Ref. Range 05/05/2019 07:34 05/05/2019 11:42 05/05/2019 16:58 05/05/2019 21:06 05/06/2019 12:15  Glucose-Capillary Latest Ref Range: 70 - 99 mg/dL 213 (H) 250 (H) 329 (H) 127 (H) 238 (H)   Diabetes history: DM 2 Outpatient Diabetes medications: Glipizide 10 QD + Metformin 1000 BID + Januvia 25 QD  Current orders for Inpatient glycemic control:  Novolog 0-9 units tid + hs Tradjenta 5 mg Daily  Inpatient Diabetes Program Recommendations:    Glucose trends elevated and also seem to increase after meal intake.  Consider increasing Novolog Correction scale to moderate 0-15 units tid  Thanks,  Tama Headings RN, MSN, BC-ADM Inpatient Diabetes Coordinator Team Pager [phone] (8a-5p)

## 2019-05-06 NOTE — Progress Notes (Addendum)
[facility] Hospital Day(s): 2.   Post op day(s): 2 Days Post-Op.   Interval History:  Patient seen and examined no acute events or new complaints overnight.  Patient reports abdominal soreness but this is improving and controlled with pain regimen No fever, chills, nausea, or emesis.  On full liquid diet, tolerating well Continues to pass flatus and had BM this morning Mobilizing well without issue   Vital signs in last 24 hours: [min-max] current  Temp:  [97.5 F (36.4 C)-97.9 F (36.6 C)] 97.5 F (36.4 C) (11/11 0606) Pulse Rate:  [65-68] 67 (11/11 0606) Resp:  [18-20] 20 (11/11 0606) BP: (120-130)/(63-68) 120/63 (11/11 0606) SpO2:  [94 %-98 %] 95 % (11/11 0606)     Height: 5\' 8"  (172.7 cm) Weight: 65.8 kg BMI (Calculated): 22.05   Intake/Output last 2 shifts:  11/10 0701 - 11/11 0700 In: 2709.6 [P.O.:360; I.V.:2299.6; IV Piggyback:50] Out: 429 [Urine:429]   Physical Exam:  Constitutional: alert, cooperative and no distress  Respiratory: breathing non-labored at rest  Cardiovascular: regular rate and sinus rhythm  Gastrointestinal: soft, incisional soreness, and non-distended. No rebound/guarding Integumentary: Midline laparotomy incision CDI with dressing in place, no drainage   Labs:  CBC Latest Ref Rng & Units 05/05/2019 05/04/2019 04/28/2019  WBC 4.0 - 10.5 K/uL 3.4(L) 5.7 6.4  Hemoglobin 13.0 - 17.0 g/dL 8.2(L) 9.5(L) 8.9(L)  Hematocrit 39.0 - 52.0 % 25.0(L) 30.8(L) 28.7(L)  Platelets 150 - 400 K/uL 169 185 177   CMP Latest Ref Rng & Units 05/05/2019 05/04/2019 04/28/2019  Glucose 70 - 99 mg/dL 233(H) - 390(H)  BUN 8 - 23 mg/dL 9 - 18  Creatinine 0.61 - 1.24 mg/dL 0.90 0.95 0.98  Sodium 135 - 145 mmol/L 135 - 136  Potassium 3.5 - 5.1 mmol/L 4.1 - 4.4  Chloride 98 - 111 mmol/L 105 - 101  CO2 22 - 32 mmol/L 26 - 23  Calcium 8.9 - 10.3 mg/dL 7.8(L) - 9.1  Total Protein 6.5 - 8.1 g/dL - - 6.7  Total Bilirubin 0.3 - 1.2  mg/dL - - 0.4  Alkaline Phos 38 - 126 U/L - - 69  AST 15 - 41 U/L - - 16  ALT 0 - 44 U/L - - 17     Imaging studies: No new pertinent imaging studies   Assessment/Plan: 68 y.o. 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   -- Edison Simon, PA-C [facility] 05/06/2019, 7:15 AM [phone] M-F: 7am - 4pm  I saw and evaluated the patient.  I agree with the above documentation, exam, and plan, which I have edited  where appropriate. Fredirick Maudlin  9:05 AM

## 2019-05-07 LAB — GLUCOSE, CAPILLARY: Glucose-Capillary: 212 mg/dL — ABNORMAL HIGH (ref 70–99)

## 2019-05-07 MED ORDER — CHLORHEXIDINE GLUCONATE CLOTH 2 % EX PADS: 6.0000 | MEDICATED_PAD | Freq: Every day | CUTANEOUS | Status: DC

## 2019-05-07 MED ORDER — OXYCODONE HCL 5 MG PO TABS: 5.0000 mg | ORAL_TABLET | Freq: Four times a day (QID) | ORAL | 0 refills | Status: DC | PRN

## 2019-05-07 NOTE — Discharge Summary (Addendum)
[facility] SURGICAL DISCHARGE SUMMARY  Patient ID: Kevin Mckay MRN: [id number] DOB/AGE: [birth year] 68 y.o.  Admit date: 05/04/2019 Discharge date: 05/07/2019  Discharge Diagnoses Patient Active Problem List   Diagnosis Date Noted  . S/P right colectomy 05/04/2019  . Malignant neoplasm of colon [facility])     Consultants None  Procedures 05/04/2019:  Right Hemicolectomy   HPI: Kevin Mckay is a 68 y.o. male who has been undergoing treatment for metastatic small cell lung carcinoma.  He was having melenic stools and requiring frequent blood transfusions.  A colonoscopy revealed a cecal mass.  This was biopsied and was consistent with adenocarcinoma.  Due to the ongoing blood loss, surgery was recommended, despite the poor prognosis from his lung cancer.  Hospital Course: Informed consent was obtained and documented, and patient underwent uneventful right hemicolectomy (Dr Celine Ahr, 05/04/2019).  Post-operatively, patient did very well and advancement of patient's diet and ambulation were well-tolerated. The remainder of patient's hospital course was essentially unremarkable, and discharge planning was initiated accordingly with patient safely able to be discharged home with appropriate discharge instructions, pain control, and outpatient follow-up after all of his questions were answered to his expressed satisfaction.  Discharge Condition: Good   Physical Examination:  Constitutional: alert, cooperative and no distress  Respiratory: breathing non-labored at rest  Cardiovascular: regular rate and sinus rhythm  Gastrointestinal: soft,incisional soreness, and non-distended. No rebound/guarding Integumentary:Midline laparotomy incision CDI with staples, no drainage   Allergies as of 05/07/2019      Reactions   Ferumoxytol Shortness Of Breath, Other (See Comments)   Infusion reaction-shortness of breath/ tachycardia/hypotension.   Amoxicillin Hives   Did it  involve swelling of the face/tongue/throat, SOB, or low BP? No Did it involve sudden or severe rash/hives, skin peeling, or any reaction on the inside of your mouth or nose? Yes Did you need to seek medical attention at a hospital or doctor's office? No When did it last happen?~5 years ago If all above answers are "NO", may proceed with cephalosporin use.   Codeine Nausea Only      Medication List    TAKE these medications   amLODipine 10 MG tablet Commonly known as: NORVASC Take 10 mg by mouth every morning.      budesonide-formoterol 160-4.5 MCG/ACT inhaler Commonly known as: SYMBICORT Inhale 2 puffs into the lungs 2 (two) times daily.   DULoxetine 30 MG capsule Commonly known as: CYMBALTA Take 30 mg by mouth every morning.   ferrous sulfate 324 (65 Fe) MG Tbec Take 324 mg by mouth daily.   gabapentin 300 MG capsule Commonly known as: NEURONTIN Take 600 mg by mouth 3 (three) times daily.   glipiZIDE 10 MG 24 hr tablet Commonly known as: GLUCOTROL XL Take 10 mg by mouth daily with breakfast.   hydrochlorothiazide 25 MG tablet Commonly known as: HYDRODIURIL Take 25 mg by mouth daily.   lidocaine-prilocaine cream Commonly known as: EMLA Apply 1 application topically as needed. 30-45 mins prior to port access.   Linzess 145 MCG Caps capsule Generic drug: linaclotide Take 145 mcg by mouth daily before breakfast.   lovastatin 20 MG tablet Commonly known as: MEVACOR Take 20 mg by mouth every evening.   metFORMIN 1000 MG tablet Commonly known as: GLUCOPHAGE Take 1,000 mg by mouth 2 (two) times daily with a meal.   multivitamin with minerals tablet Take 1 tablet by mouth daily.   omeprazole 40 MG capsule Commonly known as: PRILOSEC Take 1 capsule (40 mg total) by mouth  daily. What changed: when to take this   ondansetron 8 MG tablet Commonly known as: ZOFRAN Take 1 tablet (8 mg total) by mouth every 8 (eight) hours as needed for nausea or vomiting (start 3  days; after chemo).   oxyCODONE 5 MG immediate release tablet Commonly known as: Oxy IR/ROXICODONE Take 1 tablet (5 mg total) by mouth every 6 (six) hours as needed for severe pain or breakthrough pain.      prazosin 1 MG capsule Commonly known as: MINIPRESS Take 1 mg by mouth at bedtime.   prochlorperazine 10 MG tablet Commonly known as: COMPAZINE Take 1 tablet (10 mg total) by mouth every 6 (six) hours as needed for nausea or vomiting.   psyllium 58.6 % powder Commonly known as: METAMUCIL Take 1 packet by mouth daily.   sitaGLIPtin 25 MG tablet Commonly known as: JANUVIA Take 25 mg by mouth daily.   sucralfate 1 GM/10ML suspension Commonly known as: Carafate Take 38ml three times daily  30 minutes before meals   traMADol 50 MG tablet Commonly known as: ULTRAM Take 1 tablet (50 mg total) by mouth every 8 (eight) hours as needed. What changed: reasons to take this   vitamin B-12 100 MCG tablet Commonly known as: CYANOCOBALAMIN Take 100 mcg by mouth daily.        Follow-up Information    [name], PA-C. Schedule an appointment as soon as possible for a visit in 1 week(s).   Specialty: Physician Assistant Why: s/p right colectomy....okay to schedule him after 10 AM on call day for [facility]. Just needs staples out Contact information: [address] [place] [phone]            Time spent on discharge management including discussion of hospital course, clinical condition, outpatient instructions, prescriptions, and follow up with the patient and members of the medical team: >30 minutes  -- Edison Simon , PA-C [facility]  05/07/2019, 9:28 AM [phone] M-F: 7am - 4pm  I saw and evaluated the patient.  I agree with the above documentation, exam, and plan, which I have edited where appropriate. Fredirick Maudlin  9:42 AM

## 2019-05-07 NOTE — Discharge Instructions (Signed)
In addition to included general post-operative instructions for right colectomy,  Diet: Resume home heart healthy diet.   Activity: No heavy lifting >20 pounds (children, pets, laundry, garbage) for 4-6 weeks, but light activity and walking are encouraged. Do not drive or drink alcohol if taking narcotic pain medications or having pain that might distract from driving.  Wound care: You may shower/get incision wet with soapy water and pat dry (do not rub incisions), but no baths or submerging incision underwater until follow-up.   Medications: Resume all home medications. For mild to moderate pain: acetaminophen (Tylenol) or ibuprofen/naproxen (if no kidney disease). Combining Tylenol with alcohol can substantially increase your risk of causing liver disease. Narcotic pain medications, if prescribed, can be used for severe pain, though may cause nausea, constipation, and drowsiness. Do not combine Tylenol and Percocet (or similar) within a 6 hour period as Percocet (and similar) contain(s) Tylenol. If you do not need the narcotic pain medication, you do not need to fill the prescription.  Call office [phone] / [phone]) at any time if any questions, worsening pain, fevers/chills, bleeding, drainage from incision site, or other concerns.

## 2019-05-07 NOTE — Progress Notes (Signed)
Kevin Mckay to be D/C'd home per MD order.  Discussed prescriptions and follow up appointments with the patient. Prescriptions given to patient, medication list explained in detail. Pt verbalized understanding.  Allergies as of 05/07/2019      Reactions   Ferumoxytol Shortness Of Breath, Other (See Comments)   Infusion reaction-shortness of breath/ tachycardia/hypotension.   Amoxicillin Hives   Did it involve swelling of the face/tongue/throat, SOB, or low BP? No Did it involve sudden or severe rash/hives, skin peeling, or any reaction on the inside of your mouth or nose? Yes Did you need to seek medical attention at a hospital or doctor's office? No When did it last happen?~5 years ago If all above answers are "NO", may proceed with cephalosporin use.   Codeine Nausea Only      Medication List    TAKE these medications   amLODipine 10 MG tablet Commonly known as: NORVASC Take 10 mg by mouth every morning.   bisacodyl 5 MG EC tablet Commonly known as: DULCOLAX Take all 4 tablets at 8 am the morning prior to your surgery.   budesonide-formoterol 160-4.5 MCG/ACT inhaler Commonly known as: SYMBICORT Inhale 2 puffs into the lungs 2 (two) times daily.   DULoxetine 30 MG capsule Commonly known as: CYMBALTA Take 30 mg by mouth every morning.   ferrous sulfate 324 (65 Fe) MG Tbec Take 324 mg by mouth daily.   gabapentin 300 MG capsule Commonly known as: NEURONTIN Take 600 mg by mouth 3 (three) times daily.   glipiZIDE 10 MG 24 hr tablet Commonly known as: GLUCOTROL XL Take 10 mg by mouth daily with breakfast.   hydrochlorothiazide 25 MG tablet Commonly known as: HYDRODIURIL Take 25 mg by mouth daily.   lidocaine-prilocaine cream Commonly known as: EMLA Apply 1 application topically as needed. 30-45 mins prior to port access.   Linzess 145 MCG Caps capsule Generic drug: linaclotide Take 145 mcg by mouth daily before breakfast.   lovastatin 20 MG tablet Commonly  known as: MEVACOR Take 20 mg by mouth every evening.   metFORMIN 1000 MG tablet Commonly known as: GLUCOPHAGE Take 1,000 mg by mouth 2 (two) times daily with a meal.   multivitamin with minerals tablet Take 1 tablet by mouth daily.   omeprazole 40 MG capsule Commonly known as: PRILOSEC Take 1 capsule (40 mg total) by mouth daily. What changed: when to take this   ondansetron 8 MG tablet Commonly known as: ZOFRAN Take 1 tablet (8 mg total) by mouth every 8 (eight) hours as needed for nausea or vomiting (start 3 days; after chemo).   oxyCODONE 5 MG immediate release tablet Commonly known as: Oxy IR/ROXICODONE Take 1 tablet (5 mg total) by mouth every 6 (six) hours as needed for severe pain or breakthrough pain.   polyethylene glycol powder 17 GM/SCOOP powder Commonly known as: MiraLax Mix full container in 64 ounces of Gatorade or other clear liquid. NO RED Liquids   prazosin 1 MG capsule Commonly known as: MINIPRESS Take 1 mg by mouth at bedtime.   prochlorperazine 10 MG tablet Commonly known as: COMPAZINE Take 1 tablet (10 mg total) by mouth every 6 (six) hours as needed for nausea or vomiting.   psyllium 58.6 % powder Commonly known as: METAMUCIL Take 1 packet by mouth daily.   sitaGLIPtin 25 MG tablet Commonly known as: JANUVIA Take 25 mg by mouth daily.   sucralfate 1 GM/10ML suspension Commonly known as: Carafate Take 35ml three times daily  30 minutes before  meals   traMADol 50 MG tablet Commonly known as: ULTRAM Take 1 tablet (50 mg total) by mouth every 8 (eight) hours as needed. What changed: reasons to take this   vitamin B-12 100 MCG tablet Commonly known as: CYANOCOBALAMIN Take 100 mcg by mouth daily.       Vitals:   05/07/19 0433 05/07/19 0845  BP: (!) 142/65 (!) 134/57  Pulse: 73 78  Resp: 20   Temp: 97.8 F (36.6 C) 98.1 F (36.7 C)  SpO2: 96% 93%    Skin clean, dry and intact without evidence of skin break down, no evidence of skin  tears noted. IV catheter discontinued intact. Site without signs and symptoms of complications. Dressing and pressure applied. Pt denies pain at this time. No complaints noted.  An After Visit Summary was printed and given to the patient. Patient escorted via [place], and D/C home via private auto.  Marry Guan

## 2019-05-11 LAB — SURGICAL PATHOLOGY

## 2019-05-11 NOTE — Anesthesia Postprocedure Evaluation (Signed)
Anesthesia Post Note  Patient: Kevin Mckay  Procedure(s) Performed: COLON RESECTION RIGHT-right hemicolectomy-open (Right )  Patient location during evaluation: PACU Anesthesia Type: General Level of consciousness: awake and alert Pain management: pain level controlled Vital Signs Assessment: post-procedure vital signs reviewed and stable Respiratory status: spontaneous breathing, nonlabored ventilation, respiratory function stable and patient connected to nasal cannula oxygen Cardiovascular status: blood pressure returned to baseline and stable Postop Assessment: no apparent nausea or vomiting Anesthetic complications: no     Last Vitals:  Vitals:   05/07/19 0433 05/07/19 0845  BP: (!) 142/65 (!) 134/57  Pulse: 73 78  Resp: 20   Temp: 36.6 C 36.7 C  SpO2: 96% 93%    Last Pain:  Vitals:   05/07/19 0845  TempSrc: Oral  PainSc:                  Molli Barrows

## 2019-05-12 ENCOUNTER — Encounter (INDEPENDENT_AMBULATORY_CARE_PROVIDER_SITE_OTHER): Payer: Self-pay

## 2019-05-12 ENCOUNTER — Other Ambulatory Visit: Payer: Self-pay

## 2019-05-12 ENCOUNTER — Encounter: Payer: Self-pay | Admitting: Physician Assistant

## 2019-05-12 ENCOUNTER — Ambulatory Visit (INDEPENDENT_AMBULATORY_CARE_PROVIDER_SITE_OTHER): Payer: BC Managed Care – PPO | Admitting: Physician Assistant

## 2019-05-12 VITALS — BP 149/70 | HR 93 | Temp 98.1°F | Resp 12 | Ht 68.0 in | Wt 145.0 lb

## 2019-05-12 DIAGNOSIS — Z09 Encounter for follow-up examination after completed treatment for conditions other than malignant neoplasm: Secondary | ICD-10-CM

## 2019-05-12 DIAGNOSIS — C189 Malignant neoplasm of colon, unspecified: Secondary | ICD-10-CM

## 2019-05-12 NOTE — Progress Notes (Signed)
[facility] POST-OP OFFICE VISIT  05/12/2019  HPI: Trenell Concannon is a 68 y.o. male 8 days s/p right hemicolectomy for malignant neoplasm of the right colon with Dr Celine Ahr.   He is doing well. He notes inscisional soreness but this is improving. Managed with tramadol and oxycodone intermittently. No fever, chills, nausea, or emesis. He is tolerating a diet. No issues with bowel function. Mobilizing well. No other issues.   Vital signs: BP (!) 149/70   Pulse 93   Temp 98.1 F (36.7 C) (Temporal)   Resp 12   Ht 5\' 8"  (1.727 m)   Wt 145 lb (65.8 kg)   SpO2 98%   BMI 22.05 kg/m    Physical Exam: Constitutional: Well appearing male, NAD Abdomen: Soft, non-tender, non-distended, no rebound/guarding Skin: Laparotomy incision is CDI with staples (REMOVED), no erythema or drianage  Assessment/Plan: This is a 68 y.o. male 8 days s/p right hemicolectomy for malignant neoplasm of the right colon   - Continue pain control prn  - removed staples and placed steri-strips  - okay to continue to shower, reviewed wound care  - Continue lifting restrictions (at least 4-6 weeks)  - Reviewed pathology - high grade adenocarcinoma, margins not involved, 2/13 lymph-nodes involved  - rtc in 2 weeks for follow up with Dr Celine Ahr  -- Edison Simon, PA-C [facility] 05/12/2019, 11:16 AM [phone] M-F: 7am - 4pm

## 2019-05-12 NOTE — Patient Instructions (Signed)

## 2019-05-18 ENCOUNTER — Other Ambulatory Visit: Payer: Self-pay

## 2019-05-18 NOTE — Progress Notes (Signed)
Patient pre screened for office appointment, no questions or concerns today. Patient reminded of upcoming appointment time and date. 

## 2019-05-19 ENCOUNTER — Inpatient Hospital Stay (HOSPITAL_BASED_OUTPATIENT_CLINIC_OR_DEPARTMENT_OTHER): Payer: BC Managed Care – PPO | Admitting: Internal Medicine

## 2019-05-19 ENCOUNTER — Inpatient Hospital Stay: Payer: BC Managed Care – PPO

## 2019-05-19 ENCOUNTER — Inpatient Hospital Stay: Payer: BC Managed Care – PPO | Admitting: *Deleted

## 2019-05-19 ENCOUNTER — Other Ambulatory Visit: Payer: Self-pay

## 2019-05-19 DIAGNOSIS — C801 Malignant (primary) neoplasm, unspecified: Secondary | ICD-10-CM

## 2019-05-19 DIAGNOSIS — C781 Secondary malignant neoplasm of mediastinum: Secondary | ICD-10-CM | POA: Diagnosis not present

## 2019-05-19 DIAGNOSIS — Z7984 Long term (current) use of oral hypoglycemic drugs: Secondary | ICD-10-CM | POA: Diagnosis not present

## 2019-05-19 DIAGNOSIS — C189 Malignant neoplasm of colon, unspecified: Secondary | ICD-10-CM | POA: Diagnosis not present

## 2019-05-19 DIAGNOSIS — K769 Liver disease, unspecified: Secondary | ICD-10-CM | POA: Diagnosis not present

## 2019-05-19 DIAGNOSIS — Z9221 Personal history of antineoplastic chemotherapy: Secondary | ICD-10-CM | POA: Diagnosis not present

## 2019-05-19 DIAGNOSIS — Z923 Personal history of irradiation: Secondary | ICD-10-CM | POA: Diagnosis not present

## 2019-05-19 DIAGNOSIS — C3490 Malignant neoplasm of unspecified part of unspecified bronchus or lung: Secondary | ICD-10-CM | POA: Diagnosis not present

## 2019-05-19 DIAGNOSIS — E119 Type 2 diabetes mellitus without complications: Secondary | ICD-10-CM | POA: Diagnosis not present

## 2019-05-19 DIAGNOSIS — J449 Chronic obstructive pulmonary disease, unspecified: Secondary | ICD-10-CM | POA: Diagnosis not present

## 2019-05-19 DIAGNOSIS — I739 Peripheral vascular disease, unspecified: Secondary | ICD-10-CM | POA: Diagnosis not present

## 2019-05-19 DIAGNOSIS — I10 Essential (primary) hypertension: Secondary | ICD-10-CM | POA: Diagnosis not present

## 2019-05-19 DIAGNOSIS — Z95828 Presence of other vascular implants and grafts: Secondary | ICD-10-CM

## 2019-05-19 DIAGNOSIS — F17210 Nicotine dependence, cigarettes, uncomplicated: Secondary | ICD-10-CM | POA: Diagnosis not present

## 2019-05-19 DIAGNOSIS — D509 Iron deficiency anemia, unspecified: Secondary | ICD-10-CM | POA: Diagnosis not present

## 2019-05-19 DIAGNOSIS — F329 Major depressive disorder, single episode, unspecified: Secondary | ICD-10-CM | POA: Diagnosis not present

## 2019-05-19 DIAGNOSIS — Z79899 Other long term (current) drug therapy: Secondary | ICD-10-CM | POA: Diagnosis not present

## 2019-05-19 LAB — COMPREHENSIVE METABOLIC PANEL
ALT: 13 U/L (ref 0–44)
AST: 16 U/L (ref 15–41)
Albumin: 3.8 g/dL (ref 3.5–5.0)
Alkaline Phosphatase: 69 U/L (ref 38–126)
Anion gap: 8 (ref 5–15)
BUN: 13 mg/dL (ref 8–23)
CO2: 26 mmol/L (ref 22–32)
Calcium: 8.8 mg/dL — ABNORMAL LOW (ref 8.9–10.3)
Chloride: 97 mmol/L — ABNORMAL LOW (ref 98–111)
Creatinine, Ser: 1.17 mg/dL (ref 0.61–1.24)
GFR calc Af Amer: >60 mL/min (ref >60)
GFR calc non Af Amer: >60 mL/min (ref >60)
Glucose, Bld: 253 mg/dL — ABNORMAL HIGH (ref 70–99)
Potassium: 3.7 mmol/L (ref 3.5–5.1)
Sodium: 131 mmol/L — ABNORMAL LOW (ref 135–145)
Total Bilirubin: 0.4 mg/dL (ref 0.3–1.2)
Total Protein: 7.1 g/dL (ref 6.5–8.1)

## 2019-05-19 LAB — CBC WITH DIFFERENTIAL/PLATELET
Abs Immature Granulocytes: 0.02 10*3/uL (ref 0.00–0.07)
Basophils Absolute: 0.1 10*3/uL (ref 0.0–0.1)
Basophils Relative: 1 %
Eosinophils Absolute: 0.2 10*3/uL (ref 0.0–0.5)
Eosinophils Relative: 4 %
HCT: 32.3 % — ABNORMAL LOW (ref 39.0–52.0)
Hemoglobin: 10 g/dL — ABNORMAL LOW (ref 13.0–17.0)
Immature Granulocytes: 0 %
Lymphocytes Relative: 19 %
Lymphs Abs: 1 10*3/uL (ref 0.7–4.0)
MCH: 25.8 pg — ABNORMAL LOW (ref 26.0–34.0)
MCHC: 31 g/dL (ref 30.0–36.0)
MCV: 83.5 fL (ref 80.0–100.0)
Monocytes Absolute: 0.4 10*3/uL (ref 0.1–1.0)
Monocytes Relative: 8 %
Neutro Abs: 3.5 10*3/uL (ref 1.7–7.7)
Neutrophils Relative %: 68 %
Platelets: 273 10*3/uL (ref 150–400)
RBC: 3.87 MIL/uL — ABNORMAL LOW (ref 4.22–5.81)
RDW: 15.8 % — ABNORMAL HIGH (ref 11.5–15.5)
WBC: 5.1 10*3/uL (ref 4.0–10.5)
nRBC: 0 % (ref 0.0–0.2)

## 2019-05-19 LAB — SAMPLE TO BLOOD BANK

## 2019-05-19 MED ORDER — HEPARIN SOD (PORK) LOCK FLUSH 100 UNIT/ML IV SOLN
INTRAVENOUS | Status: AC
  Filled 2019-05-19: qty 5

## 2019-05-19 MED ORDER — HEPARIN SOD (PORK) LOCK FLUSH 100 UNIT/ML IV SOLN
500.0000 [IU] | Freq: Once | INTRAVENOUS | Status: AC
  Administered 2019-05-19: 500 [IU]

## 2019-05-19 MED ORDER — SODIUM CHLORIDE 0.9% FLUSH
10.0000 mL | Freq: Once | INTRAVENOUS | Status: AC
  Administered 2019-05-19: 10 mL via INTRAVENOUS
  Filled 2019-05-19: qty 10

## 2019-05-19 NOTE — Assessment & Plan Note (Addendum)
# ?   Limited stage small cell cancer-likely lung primary [liver lesion].  TX N2 M0. on [place] etoposide-every 3 weeks with concurrent radiation [finish on Oct 1st, 2020].    #Patient therapy for small cell lung cancer-on hold because of colon cancer/surgery.  I think is reasonable to start patient back on 2 more cycles of carbo etoposide every 3 weeks ~in approximately 2 weeks.  It is reasonable to get imaging of the chest with contrast/will likely include the liver lesion [see below].   #Colon cancer-stage III [liver lesion-see below].  Reviewed the pathology in detail.  Discussed that surgery by itself should curative approximately 60% of the times.  In general FOLFOX chemotherapy would be recommended for adjuvant basis ~which offers about 20% relative risk reduction/approximately 10% absolute risk reduction.  However hold FOLFOX therapy-given need to finish chemotherapy for lung cancer.   #  Liver lesion approximately 1 cm-difficulty biopsy/statistically suspicious of small cell. Would repeat imaging as above.   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  Hemoglobin today is 10/stable.  Will benefit today.  # DISPOSITION: # No venofer; de-access # follow up in 2 weeks- MD; labs/cbc/cmp/ Carbo-Etop- day-1; d-2& d-3; CT chest prior-Dr.B.

## 2019-05-19 NOTE — Progress Notes (Signed)
[facility] NOTE  Patient Care Team: Laneta Simmers, NP as PCP - General (Nurse Practitioner) Telford Nab, RN as Registered Nurse Clent Jacks, RN as Oncology Nurse Navigator  CHIEF COMPLAINTS/PURPOSE OF CONSULTATION: Lung cancer/colon cancer   Oncology History Overview Note  # July 2020- SMALL CELL CA METASTATIC TO MEDIASTINAL LN [open Bx; Dr.Oaks]; TxN2M0; July 2nd 2020-PET- 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  MRI brain negative  # aug 17th 2020-carbo etoposide-RT [? 8/19]  #August 2020 iron deficient anemia-question etiology; IV Feraheme [no colonoscopy]  # OCT 2020- colo/ Cecal adeno ca; MRI liver- 1 cm enhancing lesion "metastasis" [Dr.Anna/Dr.Cannon]-difficulty biopsy.  November 02-2019 right hemicolectomy- STAGE III [pT3pN1 (2/13LN)]- HOLD adjuvant therapy. MMR- INTACT/LOW  # COPD/  DM-2- on OHA/ smoker/ PVD/peripheral neuropathy  # History of alcohol abuse/quit 2014/ Hx of abdominal trauma [at 21y]  # DIAGNOSIS: SMALL CELL CA [? Lung primary]  STAGE:  limited       ;GOALS: ? 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 [facility])   02/09/2019 -  Chemotherapy   The patient had palonosetron (ALOXI) injection 0.25 mg, 0.25 mg, Intravenous,  Once, 2 of 4 cycles Administration: 0.25 mg (02/09/2019), 0.25 mg (03/03/2019) CARBOplatin (PARAPLATIN) 410 mg in sodium chloride 0.9 % 250 mL chemo infusion, 410 mg (100 % of original dose 414 mg), Intravenous,  Once, 2 of 4 cycles Dose modification:   (original dose 414 mg, Cycle 1) Administration: 410 mg (02/09/2019), 410 mg (03/03/2019) etoposide (VEPESID) 180 mg in sodium chloride 0.9 % 500 mL chemo infusion, 100 mg/m2 = 180 mg, Intravenous,  Once, 2 of 4 cycles Administration: 180 mg  (02/09/2019), 180 mg (02/10/2019), 180 mg (02/11/2019), 180 mg (03/03/2019), 180 mg (03/04/2019), 180 mg (03/05/2019) fosaprepitant (EMEND) 150 mg, dexamethasone (DECADRON) 6 mg in sodium chloride 0.9 % 145 mL IVPB, , Intravenous,  Once, 0 of 2 cycles  for chemotherapy treatment.     HISTORY OF PRESENTING ILLNESS:  Kevin Mckay 68 y.o.  male limited stage small cell-likely lung primary most recently on concurrent chemoradiation carboplatin etoposide; also new diagnosis of colon cancer is here for follow-up.  In the interim patient underwent right hemicolectomy for his colon cancer.  Postoperatively, no complications.  Patient denies any significant pain.  No blood in stools or black or stools.  Chronic shortness of breath.  Review of Systems  Constitutional: Positive for malaise/fatigue. Negative for chills, diaphoresis, fever and weight loss.  HENT: Negative for nosebleeds and sore throat.   Eyes: Negative for double vision.  Respiratory: Positive for cough, sputum production and shortness of breath. Negative for hemoptysis and wheezing.   Cardiovascular: Negative for chest pain, palpitations, orthopnea and leg swelling.  Gastrointestinal: Negative for abdominal pain, blood in stool, constipation, diarrhea, heartburn, melena, nausea and vomiting.  Genitourinary: Negative for dysuria, frequency and urgency.  Musculoskeletal: Negative for back pain and joint pain.  Skin: Negative.  Negative for itching and rash.  Neurological: Positive for tingling and headaches. Negative for dizziness, focal weakness and weakness.  Endo/Heme/Allergies: Does not bruise/bleed easily.  Psychiatric/Behavioral: Negative for depression. The patient is not nervous/anxious and does not have insomnia.      MEDICAL HISTORY:  Past Medical History:  Diagnosis Date  . Asthma   . Cancer ([place])    Metastatic small cell lung cancer  .  Cerebral aneurysm   . Chronic painful diabetic neuropathy ([place])   . Depression   .  Diabetes mellitus without complication ([place])   . Essential hypertension   . History of kidney stones   . Occasional tremors   . Tobacco use     SURGICAL HISTORY: Past Surgical History:  Procedure Laterality Date  . ABDOMINAL SURGERY     age 62. trauma surgery due to forklift injury- liver and spleen  . COLONOSCOPY WITH PROPOFOL N/A 03/27/2019   Procedure: COLONOSCOPY WITH PROPOFOL;  Surgeon: Jonathon Bellows, MD;  Location[facility] ENDOSCOPY;  Service: Gastroenterology;  Laterality: N/A;  . COLOSTOMY REVISION Right 05/04/2019   Procedure: COLON RESECTION RIGHT-right hemicolectomy-open;  Surgeon: Fredirick Maudlin, MD;  Location: [facility] ORS;  Service: General;  Laterality: Right;  . ESOPHAGOGASTRODUODENOSCOPY (EGD) WITH PROPOFOL N/A 03/27/2019   Procedure: ESOPHAGOGASTRODUODENOSCOPY (EGD) WITH PROPOFOL;  Surgeon: Jonathon Bellows, MD;  Location: [facility] ENDOSCOPY;  Service: Gastroenterology;  Laterality: N/A;  . IR GENERIC HISTORICAL  05/31/2016   IR ANGIO VERTEBRAL SEL VERTEBRAL BILAT MOD SED 05/31/2016 Consuella Lose, MD [facility]-INTERV RAD  . IR GENERIC HISTORICAL  05/31/2016   IR ANGIO INTRA EXTRACRAN SEL INTERNAL CAROTID BILAT MOD SED 05/31/2016 Consuella Lose, MD [facility]INTER[facility]  . PORTACATH PLACEMENT Right 02/04/2019   Procedure: INSERTION PORT-A-CATH;  Surgeon: [name], MD;  Location: [facility] ORS;  Service: General;  Laterality: Right;  . THORACOTOMY Left 01/22/2019   Procedure: PRE OP BRONCH LEFT ANTERIOR THORACOTOMY WITH BIOPSY OF MEDIASTINAL MASS;  Surgeon: [name], MD;  Location: [facility] ORS;  Service: General;  Laterality: Left;    SOCIAL HISTORY: Social History   Socioeconomic History  . Marital status: Married    Spouse name: Not on file  . Number of children: Not on file  . Years of education: Not on file  . Highest education level: Not on file  Occupational History  . Not on file  Social Needs  . Financial resource strain: Not hard at all  . Food insecurity    Worry: Never true     Inability: Never true  . Transportation needs    Medical: No    Non-medical: No  Tobacco Use  . Smoking status: Current Every Day Smoker    Packs/day: 0.50    Years: 50.00    Pack years: 25.00    Types: Cigarettes  . Smokeless tobacco: Never Used  Substance and Sexual Activity  . Alcohol use: No  . Drug use: Yes    Types: Barbituates, Marijuana  . Sexual activity: Not on file  Lifestyle  . Physical activity    Days per week: 0 days    Minutes per session: 0 min  . Stress: Not at all  Relationships  . Social Herbalist on phone: Patient refused    Gets together: Patient refused    Attends religious service: Patient refused    Active member of club or organization: Patient refused    Attends meetings of clubs or organizations: Patient refused    Relationship status: Patient refused  . Intimate partner violence    Fear of current or ex partner: No    Emotionally abused: No    Physically abused: No    Forced sexual activity: No  Other Topics Concern  . Not on file  Social History Narrative   Lives in [place]; wife/ son/grandson [custody]; smoker; vending business; hx of alcoholism- quit at 53.     FAMILY HISTORY: Family History  Problem Relation Age of Onset  .  Lymphoma Father   . Liver cancer Paternal Uncle   . Lung cancer Paternal Uncle   . Lung cancer Maternal Uncle     ALLERGIES:  is allergic to ferumoxytol; amoxicillin; and codeine.  MEDICATIONS:  Current Outpatient Medications  Medication Sig Dispense Refill  . amLODipine (NORVASC) 10 MG tablet Take 10 mg by mouth every morning.     . bisacodyl (DULCOLAX) 5 MG EC tablet Take all 4 tablets at 8 am the morning prior to your surgery. 4 tablet 0  . budesonide-formoterol (SYMBICORT) 160-4.5 MCG/ACT inhaler Inhale 2 puffs into the lungs 2 (two) times daily.    . DULoxetine (CYMBALTA) 30 MG capsule Take 30 mg by mouth every morning.     . ferrous sulfate 324 (65 Fe) MG TBEC Take 324 mg by mouth daily.      Marland Kitchen gabapentin (NEURONTIN) 300 MG capsule Take 600 mg by mouth 3 (three) times daily.     Marland Kitchen glipiZIDE (GLUCOTROL XL) 10 MG 24 hr tablet Take 10 mg by mouth daily with breakfast.    . hydrochlorothiazide (HYDRODIURIL) 25 MG tablet Take 25 mg by mouth daily.     Marland Kitchen lidocaine-prilocaine (EMLA) cream Apply 1 application topically as needed. 30-45 mins prior to port access. 30 g 0  . linaclotide (LINZESS) 145 MCG CAPS capsule Take 145 mcg by mouth daily before breakfast.    . lovastatin (MEVACOR) 20 MG tablet Take 20 mg by mouth every evening.    . metFORMIN (GLUCOPHAGE) 1000 MG tablet Take 1,000 mg by mouth 2 (two) times daily with a meal.    . Multiple Vitamins-Minerals (MULTIVITAMIN WITH MINERALS) tablet Take 1 tablet by mouth daily.    Marland Kitchen omeprazole (PRILOSEC) 40 MG capsule Take 1 capsule (40 mg total) by mouth daily. (Patient taking differently: Take 40 mg by mouth every morning. ) 90 capsule 1  . ondansetron (ZOFRAN) 8 MG tablet Take 1 tablet (8 mg total) by mouth every 8 (eight) hours as needed for nausea or vomiting (start 3 days; after chemo). 40 tablet 1  . oxyCODONE (OXY IR/ROXICODONE) 5 MG immediate release tablet Take 1 tablet (5 mg total) by mouth every 6 (six) hours as needed for severe pain or breakthrough pain. 20 tablet 0  . polyethylene glycol powder (MIRALAX) 17 GM/SCOOP powder Mix full container in 64 ounces of Gatorade or other clear liquid. NO RED Liquids 238 g 0  . prazosin (MINIPRESS) 1 MG capsule Take 1 mg by mouth at bedtime.    . prochlorperazine (COMPAZINE) 10 MG tablet Take 1 tablet (10 mg total) by mouth every 6 (six) hours as needed for nausea or vomiting. 40 tablet 1  . psyllium (METAMUCIL) 58.6 % powder Take 1 packet by mouth daily.    . sitaGLIPtin (JANUVIA) 25 MG tablet Take 25 mg by mouth daily.    . sucralfate (CARAFATE) 1 GM/10ML suspension Take 12m three times daily  30 minutes before meals 420 mL 3  . traMADol (ULTRAM) 50 MG tablet Take 1 tablet (50 mg total) by  mouth every 8 (eight) hours as needed. (Patient taking differently: Take 50 mg by mouth every 8 (eight) hours as needed (for pain.). ) 60 tablet 0  . vitamin B-12 (CYANOCOBALAMIN) 100 MCG tablet Take 100 mcg by mouth daily.     No current facility-administered medications for this visit.       .Marland Kitchen PHYSICAL EXAMINATION: ECOG PERFORMANCE STATUS: 0 - Asymptomatic  Vitals:   05/19/19 1410  BP: 136/67  Pulse:  81  Resp: 20  Temp: 98.1 F (36.7 C)   Filed Weights   05/19/19 1410  Weight: 143 lb (64.9 kg)    Physical Exam  Constitutional: He is oriented to person, place, and time and well-developed, well-nourished, and in no distress.  HENT:  Head: Normocephalic and atraumatic.  Mouth/Throat: Oropharynx is clear and moist. No oropharyngeal exudate.  Eyes: Pupils are equal, round, and reactive to light.  Neck: Normal range of motion. Neck supple.  Cardiovascular: Normal rate and regular rhythm.  Pulmonary/Chest: No respiratory distress. He has no wheezes.  Decreased air entry bilaterally.  Abdominal: Soft. Bowel sounds are normal. He exhibits no distension and no mass. There is no abdominal tenderness. There is no rebound and no guarding.  Abdominal incision well-healed.  Musculoskeletal: Normal range of motion.        General: No tenderness or edema.  Neurological: He is alert and oriented to person, place, and time.  Skin: Skin is warm.  Psychiatric: Affect normal.    LABORATORY DATA:  I have reviewed the data as listed Lab Results  Component Value Date   WBC 5.1 05/19/2019   HGB 10.0 (L) 05/19/2019   HCT 32.3 (L) 05/19/2019   MCV 83.5 05/19/2019   PLT 273 05/19/2019   Recent Labs    04/07/19 0814 04/28/19 1053 05/04/19 1804 05/05/19 0504 05/19/19 1341  NA 136 136  --  135 131*  K 4.1 4.4  --  4.1 3.7  CL 101 101  --  105 97*  CO2 27 23  --  26 26  GLUCOSE 268* 390*  --  233* 253*  BUN 16 18  --  9 13  CREATININE 0.79 0.98 0.95 0.90 1.17  CALCIUM 8.7* 9.1   --  7.8* 8.8*  GFRNONAA >60 >60 >60 >60 >60  GFRAA >60 >60 >60 >60 >60  PROT 6.7 6.7  --   --  7.1  ALBUMIN 3.7 3.7  --   --  3.8  AST 14* 16  --   --  16  ALT 13 17  --   --  13  ALKPHOS 57 69  --   --  69  BILITOT 0.5 0.4  --   --  0.4    RADIOGRAPHIC STUDIES: I have personally reviewed the radiological images as listed and agreed with the findings in the report. No results found.  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 Limited stage small cell cancer-likely lung primary [liver lesion].  TX N2 M0. on [place] etoposide-every 3 weeks with concurrent radiation [finish on Oct 1st, 2020].    #Patient therapy for small cell lung cancer-on hold because of colon cancer/surgery.  I think is reasonable to start patient back on 2 more cycles of carbo etoposide every 3 weeks ~in approximately 2 weeks.  It is reasonable to get imaging of the chest with contrast/will likely include the liver lesion [see below].   #Colon cancer-stage III [liver lesion-see below].  Reviewed the pathology in detail.  Discussed that surgery by itself should curative approximately 60% of the times.  In general FOLFOX chemotherapy would be recommended for adjuvant basis ~which offers about 20% relative risk reduction/approximately 10% absolute risk reduction.  However hold FOLFOX therapy-given need to finish chemotherapy for lung cancer.   #  Liver lesion approximately 1 cm-difficulty biopsy/statistically suspicious of small cell. Would repeat imaging as above.   # Iron deficient anemia-secondary to likely  chronic bleeding from colon cancer.  Hemoglobin today is 10/stable.  Will benefit today.  # DISPOSITION: # No venofer; de-access # follow up in 2 weeks- MD; labs/cbc/cmp/ Carbo-Etop- day-1; d-2& d-3; CT chest prior-Dr.B.   All questions were answered. The patient knows to call the clinic with any problems, questions or concerns.    Cammie Sickle, MD 05/20/2019 8:10 AM

## 2019-05-26 ENCOUNTER — Ambulatory Visit (INDEPENDENT_AMBULATORY_CARE_PROVIDER_SITE_OTHER): Payer: BC Managed Care – PPO | Admitting: General Surgery

## 2019-05-26 ENCOUNTER — Other Ambulatory Visit: Payer: Self-pay

## 2019-05-26 ENCOUNTER — Encounter: Payer: Self-pay | Admitting: General Surgery

## 2019-05-26 VITALS — BP 150/70 | HR 81 | Temp 97.7°F | Resp 14 | Ht 68.0 in | Wt 140.4 lb

## 2019-05-26 DIAGNOSIS — Z9049 Acquired absence of other specified parts of digestive tract: Secondary | ICD-10-CM

## 2019-05-26 NOTE — Progress Notes (Signed)
Kevin Mckay is here today for a postoperative visit.  He is a 68 year old man who has metastatic small cell lung cancer and was found, on investigation for anemia, to have a right colon mass.  He underwent a right hemicolectomy on May 04, 2019.  His postoperative course was uncomplicated.  His staples were removed 2 weeks ago by our 64 assistant.  He is here today for a postoperative visit with me.  He states that overall, he is doing well.  His appetite is good.  He is eating without nausea or vomiting.  He is having normal bowel movements.  He complains only of some mild tenderness in the right lower quadrant.  He denies any further episodes of melena or other dark-colored stools.  He has not required a blood transfusion since his operation.  At his last visit with Kevin Mckay, he did not even require an iron infusion.  For now, he is not receiving any additional chemotherapy for the colon cancer; the primary focus is managing his small cell lung cancer.  Today's Vitals   05/26/19 0959  BP: (!) 150/70  Pulse: 81  Resp: 14  Temp: 97.7 F (36.5 C)  SpO2: 98%  Weight: 140 lb 6.4 oz (63.7 kg)  Height: 5\' 8"  (1.727 m)   Body mass index is 21.35 kg/m. Focused abdominal exam: His vertical midline incision is healing nicely.  It is well approximated without any erythema, induration, or drainage identified.  There is no hernia present.  Impression and plan: This is a 68 year old man who was having melenic stools and requiring frequent blood and iron transfusions during his therapy for metastatic small cell lung cancer.  He was ultimately determined to have a right colon mass that was resected.  He has done well since his operation and has had no further episodes of bleeding.  His most recent hemoglobin was 10.  Management is being deferred to Kevin Mckay; for now he is focusing on the lung cancer.  He will continue to undergo surveillance imaging at the cancer center.  His CEA was  low/normal prior to surgery so this will likely not be of any great utility in monitoring for disease recurrence or progression.  We will see him on an as-needed basis.

## 2019-05-26 NOTE — Patient Instructions (Addendum)
Please avoid lifting anything over 10 pounds 06/15/2019. You may resume normal activities after this date.   Please call if you have questions or concerns.

## 2019-05-29 ENCOUNTER — Other Ambulatory Visit: Payer: Self-pay

## 2019-05-29 ENCOUNTER — Ambulatory Visit
Admission: RE | Admit: 2019-05-29 | Discharge: 2019-05-29 | Disposition: A | Discharge status: Home / Self Care | Payer: BC Managed Care – PPO | Source: Ambulatory Visit | Attending: Internal Medicine | Admitting: Internal Medicine

## 2019-05-29 DIAGNOSIS — C781 Secondary malignant neoplasm of mediastinum: Secondary | ICD-10-CM | POA: Diagnosis not present

## 2019-05-29 DIAGNOSIS — C801 Malignant (primary) neoplasm, unspecified: Secondary | ICD-10-CM | POA: Diagnosis present

## 2019-05-29 MED ORDER — IOHEXOL 300 MG/ML  SOLN
75.0000 mL | Freq: Once | INTRAMUSCULAR | Status: AC | PRN
  Administered 2019-05-29: 75 mL via INTRAVENOUS

## 2019-06-01 ENCOUNTER — Telehealth: Payer: Self-pay | Admitting: Internal Medicine

## 2019-06-01 NOTE — Telephone Encounter (Signed)
Late entry; 12/04-left a message for the patient to discuss the results of the CT scan.  Follow-up as planned.

## 2019-06-02 ENCOUNTER — Encounter: Payer: Self-pay | Admitting: Internal Medicine

## 2019-06-02 ENCOUNTER — Other Ambulatory Visit: Payer: Self-pay

## 2019-06-02 ENCOUNTER — Inpatient Hospital Stay: Payer: BC Managed Care – PPO

## 2019-06-02 ENCOUNTER — Inpatient Hospital Stay: Payer: BC Managed Care – PPO | Attending: Internal Medicine | Admitting: Internal Medicine

## 2019-06-02 VITALS — BP 165/84 | HR 73 | Resp 18

## 2019-06-02 DIAGNOSIS — D509 Iron deficiency anemia, unspecified: Secondary | ICD-10-CM | POA: Diagnosis not present

## 2019-06-02 DIAGNOSIS — E119 Type 2 diabetes mellitus without complications: Secondary | ICD-10-CM | POA: Insufficient documentation

## 2019-06-02 DIAGNOSIS — F17210 Nicotine dependence, cigarettes, uncomplicated: Secondary | ICD-10-CM | POA: Diagnosis not present

## 2019-06-02 DIAGNOSIS — I2510 Atherosclerotic heart disease of native coronary artery without angina pectoris: Secondary | ICD-10-CM | POA: Insufficient documentation

## 2019-06-02 DIAGNOSIS — F1011 Alcohol abuse, in remission: Secondary | ICD-10-CM | POA: Diagnosis not present

## 2019-06-02 DIAGNOSIS — J439 Emphysema, unspecified: Secondary | ICD-10-CM | POA: Insufficient documentation

## 2019-06-02 DIAGNOSIS — C787 Secondary malignant neoplasm of liver and intrahepatic bile duct: Secondary | ICD-10-CM | POA: Diagnosis not present

## 2019-06-02 DIAGNOSIS — I700 Atherosclerosis of aorta: Secondary | ICD-10-CM | POA: Insufficient documentation

## 2019-06-02 DIAGNOSIS — Z923 Personal history of irradiation: Secondary | ICD-10-CM | POA: Insufficient documentation

## 2019-06-02 DIAGNOSIS — K769 Liver disease, unspecified: Secondary | ICD-10-CM | POA: Diagnosis not present

## 2019-06-02 DIAGNOSIS — F329 Major depressive disorder, single episode, unspecified: Secondary | ICD-10-CM | POA: Diagnosis not present

## 2019-06-02 DIAGNOSIS — J449 Chronic obstructive pulmonary disease, unspecified: Secondary | ICD-10-CM | POA: Insufficient documentation

## 2019-06-02 DIAGNOSIS — Z5111 Encounter for antineoplastic chemotherapy: Secondary | ICD-10-CM | POA: Insufficient documentation

## 2019-06-02 DIAGNOSIS — C801 Malignant (primary) neoplasm, unspecified: Secondary | ICD-10-CM | POA: Insufficient documentation

## 2019-06-02 DIAGNOSIS — C781 Secondary malignant neoplasm of mediastinum: Secondary | ICD-10-CM

## 2019-06-02 DIAGNOSIS — Z7984 Long term (current) use of oral hypoglycemic drugs: Secondary | ICD-10-CM | POA: Insufficient documentation

## 2019-06-02 DIAGNOSIS — I739 Peripheral vascular disease, unspecified: Secondary | ICD-10-CM | POA: Diagnosis not present

## 2019-06-02 DIAGNOSIS — I10 Essential (primary) hypertension: Secondary | ICD-10-CM | POA: Insufficient documentation

## 2019-06-02 DIAGNOSIS — Z7951 Long term (current) use of inhaled steroids: Secondary | ICD-10-CM | POA: Diagnosis not present

## 2019-06-02 DIAGNOSIS — C771 Secondary and unspecified malignant neoplasm of intrathoracic lymph nodes: Secondary | ICD-10-CM | POA: Diagnosis not present

## 2019-06-02 DIAGNOSIS — Z79899 Other long term (current) drug therapy: Secondary | ICD-10-CM | POA: Diagnosis not present

## 2019-06-02 DIAGNOSIS — C180 Malignant neoplasm of cecum: Secondary | ICD-10-CM | POA: Insufficient documentation

## 2019-06-02 LAB — COMPREHENSIVE METABOLIC PANEL
ALT: 12 U/L (ref 0–44)
AST: 14 U/L — ABNORMAL LOW (ref 15–41)
Albumin: 3.9 g/dL (ref 3.5–5.0)
Alkaline Phosphatase: 58 U/L (ref 38–126)
Anion gap: 8 (ref 5–15)
BUN: 16 mg/dL (ref 8–23)
CO2: 27 mmol/L (ref 22–32)
Calcium: 9.3 mg/dL (ref 8.9–10.3)
Chloride: 100 mmol/L (ref 98–111)
Creatinine, Ser: 1.05 mg/dL (ref 0.61–1.24)
GFR calc Af Amer: >60 mL/min (ref >60)
GFR calc non Af Amer: >60 mL/min (ref >60)
Glucose, Bld: 273 mg/dL — ABNORMAL HIGH (ref 70–99)
Potassium: 3.8 mmol/L (ref 3.5–5.1)
Sodium: 135 mmol/L (ref 135–145)
Total Bilirubin: 0.4 mg/dL (ref 0.3–1.2)
Total Protein: 6.9 g/dL (ref 6.5–8.1)

## 2019-06-02 LAB — CBC WITH DIFFERENTIAL/PLATELET
Abs Immature Granulocytes: 0.01 10*3/uL (ref 0.00–0.07)
Basophils Absolute: 0 10*3/uL (ref 0.0–0.1)
Basophils Relative: 1 %
Eosinophils Absolute: 0.4 10*3/uL (ref 0.0–0.5)
Eosinophils Relative: 8 %
HCT: 35.3 % — ABNORMAL LOW (ref 39.0–52.0)
Hemoglobin: 10.9 g/dL — ABNORMAL LOW (ref 13.0–17.0)
Immature Granulocytes: 0 %
Lymphocytes Relative: 23 %
Lymphs Abs: 1.2 10*3/uL (ref 0.7–4.0)
MCH: 25.7 pg — ABNORMAL LOW (ref 26.0–34.0)
MCHC: 30.9 g/dL (ref 30.0–36.0)
MCV: 83.3 fL (ref 80.0–100.0)
Monocytes Absolute: 0.4 10*3/uL (ref 0.1–1.0)
Monocytes Relative: 7 %
Neutro Abs: 3.3 10*3/uL (ref 1.7–7.7)
Neutrophils Relative %: 61 %
Platelets: 163 10*3/uL (ref 150–400)
RBC: 4.24 MIL/uL (ref 4.22–5.81)
RDW: 15.6 % — ABNORMAL HIGH (ref 11.5–15.5)
WBC: 5.3 10*3/uL (ref 4.0–10.5)
nRBC: 0 % (ref 0.0–0.2)

## 2019-06-02 MED ORDER — SODIUM CHLORIDE 0.9 % IV SOLN
100.0000 mg/m2 | Freq: Once | INTRAVENOUS | Status: AC
  Administered 2019-06-02: 180 mg via INTRAVENOUS
  Filled 2019-06-02: qty 9

## 2019-06-02 MED ORDER — SODIUM CHLORIDE 0.9 % IV SOLN
Freq: Once | INTRAVENOUS | Status: AC
  Administered 2019-06-02: 10:00:00 via INTRAVENOUS
  Filled 2019-06-02: qty 250

## 2019-06-02 MED ORDER — HEPARIN SOD (PORK) LOCK FLUSH 100 UNIT/ML IV SOLN
500.0000 [IU] | Freq: Once | INTRAVENOUS | Status: AC | PRN
  Administered 2019-06-02: 500 [IU]
  Filled 2019-06-02: qty 5

## 2019-06-02 MED ORDER — SODIUM CHLORIDE 0.9 % IV SOLN
Freq: Once | INTRAVENOUS | Status: AC
  Administered 2019-06-02: 10:00:00 via INTRAVENOUS
  Filled 2019-06-02: qty 5

## 2019-06-02 MED ORDER — SODIUM CHLORIDE 0.9 % IV SOLN
410.0000 mg | Freq: Once | INTRAVENOUS | Status: AC
  Administered 2019-06-02: 410 mg via INTRAVENOUS
  Filled 2019-06-02: qty 41

## 2019-06-02 MED ORDER — PALONOSETRON HCL INJECTION 0.25 MG/5ML
0.2500 mg | Freq: Once | INTRAVENOUS | Status: AC
  Administered 2019-06-02: 0.25 mg via INTRAVENOUS
  Filled 2019-06-02: qty 5

## 2019-06-02 MED ORDER — SODIUM CHLORIDE 0.9% FLUSH
10.0000 mL | Freq: Once | INTRAVENOUS | Status: AC
  Administered 2019-06-02: 10 mL via INTRAVENOUS
  Filled 2019-06-02: qty 10

## 2019-06-02 NOTE — Assessment & Plan Note (Addendum)
# ?   Limited stage small cell cancer-likely lung primary [liver lesion].  TX N2 M0. s/p [place] etoposide-every 3 weeks with concurrent radiation [finished on Oct 1st, 2020]. Dec 7th- PR; stable liver lesion [see discussion below];Patient therapy for small cell lung cancer-interuppted because of colon cancer/surgery.  Proceed with 2 more cycles of carbo-etop.  # proceed with carbo-etop today. Labs today reviewed;  acceptable for treatment today.  We will plan 1 more cycle of chemotherapy.   #  Liver lesion approximately 1 cm-difficulty biopsy/statistically suspicious of small cell.  Discussed that liver lesion is suggestive of stage IV/metastatic disease-although unable to improve because of difficulty with biopsy.  CT- DEC 7th- Stable. Monitor for now.   # Stage III- colon cancer- hold off adjuvant therapy for now.   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  Hemoglobin today is 10.5- stable.  # DISPOSITION: # chemo today/this week. # follow up in 12/28- MD; labs/cbc/cmp/ Carbo-Etop- day-1; d-2& d-3; -Dr.B.   # I reviewed the blood work- with the patient in detail; also reviewed the imaging independently [as summarized above]; and with the patient in detail.

## 2019-06-02 NOTE — Progress Notes (Signed)
[place] NOTE  Patient Care Team: Laneta Simmers, NP as PCP - General (Nurse Practitioner) Telford Nab, RN as Registered Nurse Clent Jacks, RN as Oncology Nurse Navigator  CHIEF COMPLAINTS/PURPOSE OF CONSULTATION: Lung cancer/colon cancer   Oncology History Overview Note  # July 2020- SMALL CELL CA METASTATIC TO MEDIASTINAL LN [open Bx; Dr.Oaks]; TxN2M0; July 2nd 2020-PET- 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  MRI brain negative  # aug 17th 2020-carbo etoposide-RT [? 8/19]  #August 2020 iron deficient anemia-question etiology; IV Feraheme [no colonoscopy]  # OCT 2020- colo/ Cecal adeno ca; MRI liver- 1 cm enhancing lesion "metastasis" [Dr.Anna/Dr.Cannon]-difficulty biopsy.  November 02-2019 right hemicolectomy- STAGE III [pT3pN1 (2/13LN)]- HOLD adjuvant therapy. MMR- INTACT/LOW  # COPD/  DM-2- on OHA/ smoker/ PVD/peripheral neuropathy  # History of alcohol abuse/quit 2014/ Hx of abdominal trauma [at 21y]  # DIAGNOSIS: SMALL CELL CA [? Lung primary]  STAGE:  limited       ;GOALS: ? 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 [facility])   02/09/2019 -  Chemotherapy   The patient had palonosetron (ALOXI) injection 0.25 mg, 0.25 mg, Intravenous,  Once, 3 of 4 cycles Administration: 0.25 mg (02/09/2019), 0.25 mg (03/03/2019), 0.25 mg (06/02/2019) CARBOplatin (PARAPLATIN) 410 mg in sodium chloride 0.9 % 250 mL chemo infusion, 410 mg (100 % of original dose 414 mg), Intravenous,  Once, 3 of 4 cycles Dose modification:   (original dose 414 mg, Cycle 1) Administration: 410 mg (02/09/2019), 410 mg (03/03/2019), 410 mg (06/02/2019) etoposide (VEPESID) 180 mg in sodium chloride 0.9 % 500 mL chemo infusion, 100 mg/m2 = 180 mg, Intravenous,  Once, 3  of 4 cycles Administration: 180 mg (02/09/2019), 180 mg (02/10/2019), 180 mg (02/11/2019), 180 mg (03/03/2019), 180 mg (03/04/2019), 180 mg (03/05/2019), 180 mg (06/02/2019) fosaprepitant (EMEND) 150 mg, dexamethasone (DECADRON) 6 mg in sodium chloride 0.9 % 145 mL IVPB, , Intravenous,  Once, 1 of 2 cycles Administration:  (06/02/2019)  for chemotherapy treatment.     HISTORY OF PRESENTING ILLNESS:  Kevin Mckay 68 y.o.  male limited stage small cell-likely lung primary most recently on concurrent chemoradiation carboplatin etoposide; also new diagnosis of colon cancer-stage III is here for follow-up.  Patient denies any nausea vomiting.  Chronic shortness of breath chronic cough.  Denies any headaches.  Mild to moderate fatigue.  Review of Systems  Constitutional: Positive for malaise/fatigue. Negative for chills, diaphoresis, fever and weight loss.  HENT: Negative for nosebleeds and sore throat.   Eyes: Negative for double vision.  Respiratory: Positive for cough, sputum production and shortness of breath. Negative for hemoptysis and wheezing.   Cardiovascular: Negative for chest pain, palpitations, orthopnea and leg swelling.  Gastrointestinal: Negative for abdominal pain, blood in stool, constipation, diarrhea, heartburn, melena, nausea and vomiting.  Genitourinary: Negative for dysuria, frequency and urgency.  Musculoskeletal: Negative for back pain and joint pain.  Skin: Negative.  Negative for itching and rash.  Neurological: Negative for dizziness, focal weakness and weakness.  Endo/Heme/Allergies: Does not bruise/bleed easily.  Psychiatric/Behavioral: Negative for depression. The patient is not nervous/anxious and does not have insomnia.      MEDICAL HISTORY:  Past Medical History:  Diagnosis Date  . Asthma   . Cancer ([place])    Metastatic small cell lung cancer  . Cerebral aneurysm   .  Chronic painful diabetic neuropathy ([place])   . Depression   . Diabetes mellitus without  complication ([place])   . Essential hypertension   . History of kidney stones   . Occasional tremors   . Tobacco use     SURGICAL HISTORY: Past Surgical History:  Procedure Laterality Date  . ABDOMINAL SURGERY     age 52. trauma surgery due to forklift injury- liver and spleen  . COLONOSCOPY WITH PROPOFOL N/A 03/27/2019   Procedure: COLONOSCOPY WITH PROPOFOL;  Surgeon: Jonathon Bellows, MD;  Location: [facility] ENDOSCOPY;  Service: Gastroenterology;  Laterality: N/A;  . COLOSTOMY REVISION Right 05/04/2019   Procedure: COLON RESECTION RIGHT-right hemicolectomy-open;  Surgeon: Fredirick Maudlin, MD;  Location: [facility] ORS;  Service: General;  Laterality: Right;  . ESOPHAGOGASTRODUODENOSCOPY (EGD) WITH PROPOFOL N/A 03/27/2019   Procedure: ESOPHAGOGASTRODUODENOSCOPY (EGD) WITH PROPOFOL;  Surgeon: Jonathon Bellows, MD;  Location: [facility] ENDOSCOPY;  Service: Gastroenterology;  Laterality: N/A;  . IR GENERIC HISTORICAL  05/31/2016   IR ANGIO VERTEBRAL SEL VERTEBRAL BILAT MOD SED 05/31/2016 Consuella Lose, MD [facility]-INTERV RAD  . IR GENERIC HISTORICAL  05/31/2016   IR ANGIO INTRA EXTRACRAN SEL INTERNAL CAROTID BILAT MOD SED 05/31/2016 Consuella Lose, MD [facility]INTER[facility]  . PORTACATH PLACEMENT Right 02/04/2019   Procedure: INSERTION PORT-A-CATH;  Surgeon: [name], MD;  Location: [facility] ORS;  Service: General;  Laterality: Right;  . THORACOTOMY Left 01/22/2019   Procedure: PRE OP BRONCH LEFT ANTERIOR THORACOTOMY WITH BIOPSY OF MEDIASTINAL MASS;  Surgeon: [name], MD;  Location: [facility] ORS;  Service: General;  Laterality: Left;    SOCIAL HISTORY: Social History   Socioeconomic History  . Marital status: Married    Spouse name: Not on file  . Number of children: Not on file  . Years of education: Not on file  . Highest education level: Not on file  Occupational History  . Not on file  Social Needs  . Financial resource strain: Not hard at all  . Food insecurity    Worry: Never true    Inability: Never true  .  Transportation needs    Medical: No    Non-medical: No  Tobacco Use  . Smoking status: Current Every Day Smoker    Packs/day: 0.50    Years: 50.00    Pack years: 25.00    Types: Cigarettes  . Smokeless tobacco: Never Used  Substance and Sexual Activity  . Alcohol use: No  . Drug use: Yes    Types: Barbituates, Marijuana  . Sexual activity: Not on file  Lifestyle  . Physical activity    Days per week: 0 days    Minutes per session: 0 min  . Stress: Not at all  Relationships  . Social Herbalist on phone: Patient refused    Gets together: Patient refused    Attends religious service: Patient refused    Active member of club or organization: Patient refused    Attends meetings of clubs or organizations: Patient refused    Relationship status: Patient refused  . Intimate partner violence    Fear of current or ex partner: No    Emotionally abused: No    Physically abused: No    Forced sexual activity: No  Other Topics Concern  . Not on file  Social History Narrative   Lives in [place]; wife/ son/grandson [custody]; smoker; vending business; hx of alcoholism- quit at 46.     FAMILY HISTORY: Family History  Problem Relation Age of Onset  . Lymphoma Father   .  Liver cancer Paternal Uncle   . Lung cancer Paternal Uncle   . Lung cancer Maternal Uncle     ALLERGIES:  is allergic to ferumoxytol; amoxicillin; and codeine.  MEDICATIONS:  Current Outpatient Medications  Medication Sig Dispense Refill  . amLODipine (NORVASC) 10 MG tablet Take 10 mg by mouth every morning.     . budesonide-formoterol (SYMBICORT) 160-4.5 MCG/ACT inhaler Inhale 2 puffs into the lungs 2 (two) times daily.    . DULoxetine (CYMBALTA) 30 MG capsule Take 30 mg by mouth every morning.     . ferrous sulfate 324 (65 Fe) MG TBEC Take 324 mg by mouth daily.     Marland Kitchen gabapentin (NEURONTIN) 300 MG capsule Take 600 mg by mouth 3 (three) times daily.     . hydrochlorothiazide (HYDRODIURIL) 25 MG  tablet Take 25 mg by mouth daily.     Marland Kitchen lidocaine-prilocaine (EMLA) cream Apply 1 application topically as needed. 30-45 mins prior to port access. 30 g 0  . linaclotide (LINZESS) 145 MCG CAPS capsule Take 145 mcg by mouth daily before breakfast.    . lovastatin (MEVACOR) 20 MG tablet Take 20 mg by mouth every evening.    . metFORMIN (GLUCOPHAGE) 1000 MG tablet Take 1,000 mg by mouth 2 (two) times daily with a meal.    . Multiple Vitamins-Minerals (MULTIVITAMIN WITH MINERALS) tablet Take 1 tablet by mouth daily.    Marland Kitchen omeprazole (PRILOSEC) 40 MG capsule Take 1 capsule (40 mg total) by mouth daily. (Patient taking differently: Take 40 mg by mouth every morning. ) 90 capsule 1  . ondansetron (ZOFRAN) 8 MG tablet Take 1 tablet (8 mg total) by mouth every 8 (eight) hours as needed for nausea or vomiting (start 3 days; after chemo). 40 tablet 1  . polyethylene glycol powder (MIRALAX) 17 GM/SCOOP powder Mix full container in 64 ounces of Gatorade or other clear liquid. NO RED Liquids 238 g 0  . prazosin (MINIPRESS) 1 MG capsule Take 1 mg by mouth at bedtime.    . prochlorperazine (COMPAZINE) 10 MG tablet Take 1 tablet (10 mg total) by mouth every 6 (six) hours as needed for nausea or vomiting. 40 tablet 1  . psyllium (METAMUCIL) 58.6 % powder Take 1 packet by mouth daily.    . sitaGLIPtin (JANUVIA) 25 MG tablet Take 25 mg by mouth daily.    . sucralfate (CARAFATE) 1 GM/10ML suspension Take 30m three times daily  30 minutes before meals 420 mL 3  . traMADol (ULTRAM) 50 MG tablet Take 1 tablet (50 mg total) by mouth every 8 (eight) hours as needed. (Patient taking differently: Take 50 mg by mouth every 8 (eight) hours as needed (for pain.). ) 60 tablet 0  . vitamin B-12 (CYANOCOBALAMIN) 100 MCG tablet Take 100 mcg by mouth daily.     No current facility-administered medications for this visit.       .Marland Kitchen PHYSICAL EXAMINATION: ECOG PERFORMANCE STATUS: 0 - Asymptomatic  Vitals:   06/02/19 0853  BP:  (!) 184/80  Pulse: 80  Temp: 97.6 F (36.4 C)   Filed Weights   06/02/19 0853  Weight: 140 lb 6 oz (63.7 kg)    Physical Exam  Constitutional: He is oriented to person, place, and time and well-developed, well-nourished, and in no distress.  HENT:  Head: Normocephalic and atraumatic.  Mouth/Throat: Oropharynx is clear and moist. No oropharyngeal exudate.  Eyes: Pupils are equal, round, and reactive to light.  Neck: Normal range of motion. Neck supple.  Cardiovascular: Normal rate and regular rhythm.  Pulmonary/Chest: No respiratory distress. He has no wheezes.  Decreased air entry bilaterally.  Abdominal: Soft. Bowel sounds are normal. He exhibits no distension and no mass. There is no abdominal tenderness. There is no rebound and no guarding.  Abdominal incision well-healed.  Musculoskeletal: Normal range of motion.        General: No tenderness or edema.  Neurological: He is alert and oriented to person, place, and time.  Skin: Skin is warm.  Psychiatric: Affect normal.    LABORATORY DATA:  I have reviewed the data as listed Lab Results  Component Value Date   WBC 5.3 06/02/2019   HGB 10.9 (L) 06/02/2019   HCT 35.3 (L) 06/02/2019   MCV 83.3 06/02/2019   PLT 163 06/02/2019   Recent Labs    04/28/19 1053  05/05/19 0504 05/19/19 1341 06/02/19 0840  NA 136  --  135 131* 135  K 4.4  --  4.1 3.7 3.8  CL 101  --  105 97* 100  CO2 23  --  '26 26 27  ' GLUCOSE 390*  --  233* 253* 273*  BUN 18  --  '9 13 16  ' CREATININE 0.98   < > 0.90 1.17 1.05  CALCIUM 9.1  --  7.8* 8.8* 9.3  GFRNONAA >60   < > >60 >60 >60  GFRAA >60   < > >60 >60 >60  PROT 6.7  --   --  7.1 6.9  ALBUMIN 3.7  --   --  3.8 3.9  AST 16  --   --  16 14*  ALT 17  --   --  13 12  ALKPHOS 69  --   --  69 58  BILITOT 0.4  --   --  0.4 0.4   < > = values in this interval not displayed.    RADIOGRAPHIC STUDIES: I have personally reviewed the radiological images as listed and agreed with the findings in  the report. Ct Chest W Contrast  Addendum Date: 06/01/2019   ADDENDUM REPORT: 06/01/2019 13:42 ADDENDUM: After the initial dictation, the presence of a right hepatic lobe lesion came to my attention. Today, this is only subtly apparent, suboptimally evaluated due to bolus timing. example at 7 mm on 131/2. Compared to the abdominal CT of 04/06/2019, likely minimally decreased in size. 10 mm on that exam. Electronically Signed   By: Abigail Miyamoto M.D.   On: 06/01/2019 13:42   Result Date: 06/01/2019 CLINICAL DATA:  limited stage small-cell lung cancer. Status post chemotherapy and radiation therapy. New diagnosis of colon cancer. EXAM: CT CHEST WITH CONTRAST TECHNIQUE: Multidetector CT imaging of the chest was performed during intravenous contrast administration. CONTRAST:  87m OMNIPAQUE IOHEXOL 300 MG/ML  SOLN COMPARISON:  01/23/2019 chest radiograph. Most recent chest CT 11/25/2018. FINDINGS: Cardiovascular: Right Port-A-Cath tip at superior caval/atrial junction. Aortic atherosclerosis. Tortuous thoracic aorta. Normal heart size, without pericardial effusion. Multivessel coronary artery atherosclerosis. No central pulmonary embolism, on this non-dedicated study. Mediastinum/Nodes: No supraclavicular adenopathy. The AP window lesion measures 1.9 x 0.8 cm on 57/2. Compare 2.9 x 2.4 cm on the prior diagnostic CT (when remeasured). No hilar adenopathy. Lungs/Pleura: No pleural fluid. Presumed secretions within the bronchus intermedius. Mild centrilobular emphysema. A right upper lobe 2 mm subpleural pulmonary nodule is unchanged. Upper Abdomen: Absence of the pancreatic body and tail. Normal imaged portions of the liver, gallbladder, spleen, right adrenal gland, kidneys. Mild left adrenal thickening and nodularity are unchanged.  Prominent left upper quadrant collaterals are again identified. Abdominal aortic atherosclerosis. Musculoskeletal: No acute osseous abnormality. IMPRESSION: 1. Response to therapy of AP  window lesion. 2. No new or progressive disease. 3. Aortic atherosclerosis (ICD10-I70.0), coronary artery atherosclerosis and emphysema (ICD10-J43.9). Electronically Signed: By: Abigail Miyamoto M.D. 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 Limited stage small cell cancer-likely lung primary [liver lesion].  TX N2 M0. s/p [place] etoposide-every 3 weeks with concurrent radiation [finished on Oct 1st, 2020]. Dec 7th- PR; stable liver lesion [see discussion below];Patient therapy for small cell lung cancer-interuppted because of colon cancer/surgery.  Proceed with 2 more cycles of carbo-etop.  # proceed with carbo-etop today. Labs today reviewed;  acceptable for treatment today.  We will plan 1 more cycle of chemotherapy.   #  Liver lesion approximately 1 cm-difficulty biopsy/statistically suspicious of small cell.  Discussed that liver lesion is suggestive of stage IV/metastatic disease-although unable to improve because of difficulty with biopsy.  CT- DEC 7th- Stable. Monitor for now.   # Stage III- colon cancer- hold off adjuvant therapy for now.   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  Hemoglobin today is 10.5- stable.  # DISPOSITION: # chemo today/this week. # follow up in 12/28- MD; labs/cbc/cmp/ Carbo-Etop- day-1; d-2& d-3; -Dr.B.   # I reviewed the blood work- with the patient in detail; also reviewed the imaging independently [as summarized above]; and with the patient in detail.    All questions were answered. The patient knows to call the clinic with any problems, questions or concerns.    Cammie Sickle, MD 06/02/2019 5:10 PM

## 2019-06-03 ENCOUNTER — Inpatient Hospital Stay: Payer: BC Managed Care – PPO

## 2019-06-03 ENCOUNTER — Other Ambulatory Visit: Payer: Self-pay

## 2019-06-03 VITALS — BP 132/69 | HR 74 | Temp 97.0°F | Resp 18

## 2019-06-03 DIAGNOSIS — C781 Secondary malignant neoplasm of mediastinum: Secondary | ICD-10-CM

## 2019-06-03 DIAGNOSIS — Z5111 Encounter for antineoplastic chemotherapy: Secondary | ICD-10-CM | POA: Diagnosis not present

## 2019-06-03 MED ORDER — HEPARIN SOD (PORK) LOCK FLUSH 100 UNIT/ML IV SOLN
500.0000 [IU] | Freq: Once | INTRAVENOUS | Status: AC | PRN
  Administered 2019-06-03: 11:00:00 500 [IU]
  Filled 2019-06-03: qty 5

## 2019-06-03 MED ORDER — DEXAMETHASONE SODIUM PHOSPHATE 10 MG/ML IJ SOLN
4.0000 mg | Freq: Once | INTRAMUSCULAR | Status: AC
  Administered 2019-06-03: 4 mg via INTRAVENOUS
  Filled 2019-06-03: qty 1

## 2019-06-03 MED ORDER — SODIUM CHLORIDE 0.9 % IV SOLN
Freq: Once | INTRAVENOUS | Status: AC
  Administered 2019-06-03: 09:00:00 via INTRAVENOUS
  Filled 2019-06-03: qty 250

## 2019-06-03 MED ORDER — SODIUM CHLORIDE 0.9 % IV SOLN
100.0000 mg/m2 | Freq: Once | INTRAVENOUS | Status: AC
  Administered 2019-06-03: 180 mg via INTRAVENOUS
  Filled 2019-06-03: qty 9

## 2019-06-04 ENCOUNTER — Other Ambulatory Visit: Payer: Self-pay

## 2019-06-04 ENCOUNTER — Inpatient Hospital Stay: Payer: BC Managed Care – PPO

## 2019-06-04 VITALS — BP 128/66 | HR 70 | Temp 97.0°F | Resp 18

## 2019-06-04 DIAGNOSIS — Z5111 Encounter for antineoplastic chemotherapy: Secondary | ICD-10-CM | POA: Diagnosis not present

## 2019-06-04 DIAGNOSIS — C801 Malignant (primary) neoplasm, unspecified: Secondary | ICD-10-CM

## 2019-06-04 DIAGNOSIS — C781 Secondary malignant neoplasm of mediastinum: Secondary | ICD-10-CM

## 2019-06-04 MED ORDER — SODIUM CHLORIDE 0.9 % IV SOLN
100.0000 mg/m2 | Freq: Once | INTRAVENOUS | Status: AC
  Administered 2019-06-04: 180 mg via INTRAVENOUS
  Filled 2019-06-04: qty 9

## 2019-06-04 MED ORDER — HEPARIN SOD (PORK) LOCK FLUSH 100 UNIT/ML IV SOLN
500.0000 [IU] | Freq: Once | INTRAVENOUS | Status: AC | PRN
  Administered 2019-06-04: 500 [IU]
  Filled 2019-06-04: qty 5

## 2019-06-04 MED ORDER — DEXAMETHASONE SODIUM PHOSPHATE 10 MG/ML IJ SOLN
4.0000 mg | Freq: Once | INTRAMUSCULAR | Status: AC
  Administered 2019-06-04: 4 mg via INTRAVENOUS
  Filled 2019-06-04: qty 1

## 2019-06-04 MED ORDER — SODIUM CHLORIDE 0.9 % IV SOLN
Freq: Once | INTRAVENOUS | Status: AC
  Administered 2019-06-04: 09:00:00 via INTRAVENOUS
  Filled 2019-06-04: qty 250

## 2019-06-18 ENCOUNTER — Other Ambulatory Visit: Payer: Self-pay

## 2019-06-22 ENCOUNTER — Inpatient Hospital Stay: Payer: BC Managed Care – PPO

## 2019-06-22 ENCOUNTER — Inpatient Hospital Stay (HOSPITAL_BASED_OUTPATIENT_CLINIC_OR_DEPARTMENT_OTHER): Payer: BC Managed Care – PPO | Admitting: Internal Medicine

## 2019-06-22 ENCOUNTER — Other Ambulatory Visit: Payer: Self-pay

## 2019-06-22 DIAGNOSIS — C781 Secondary malignant neoplasm of mediastinum: Secondary | ICD-10-CM

## 2019-06-22 DIAGNOSIS — Z5111 Encounter for antineoplastic chemotherapy: Secondary | ICD-10-CM | POA: Diagnosis not present

## 2019-06-22 DIAGNOSIS — C801 Malignant (primary) neoplasm, unspecified: Secondary | ICD-10-CM

## 2019-06-22 LAB — COMPREHENSIVE METABOLIC PANEL
ALT: 13 U/L (ref 0–44)
AST: 16 U/L (ref 15–41)
Albumin: 3.9 g/dL (ref 3.5–5.0)
Alkaline Phosphatase: 70 U/L (ref 38–126)
Anion gap: 13 (ref 5–15)
BUN: 17 mg/dL (ref 8–23)
CO2: 23 mmol/L (ref 22–32)
Calcium: 9.3 mg/dL (ref 8.9–10.3)
Chloride: 97 mmol/L — ABNORMAL LOW (ref 98–111)
Creatinine, Ser: 1.04 mg/dL (ref 0.61–1.24)
GFR calc Af Amer: >60 mL/min (ref >60)
GFR calc non Af Amer: >60 mL/min (ref >60)
Glucose, Bld: 287 mg/dL — ABNORMAL HIGH (ref 70–99)
Potassium: 3.7 mmol/L (ref 3.5–5.1)
Sodium: 133 mmol/L — ABNORMAL LOW (ref 135–145)
Total Bilirubin: 0.4 mg/dL (ref 0.3–1.2)
Total Protein: 7.1 g/dL (ref 6.5–8.1)

## 2019-06-22 LAB — CBC WITH DIFFERENTIAL/PLATELET
Abs Immature Granulocytes: 0.03 10*3/uL (ref 0.00–0.07)
Basophils Absolute: 0 10*3/uL (ref 0.0–0.1)
Basophils Relative: 1 %
Eosinophils Absolute: 0.1 10*3/uL (ref 0.0–0.5)
Eosinophils Relative: 2 %
HCT: 34.4 % — ABNORMAL LOW (ref 39.0–52.0)
Hemoglobin: 10.9 g/dL — ABNORMAL LOW (ref 13.0–17.0)
Immature Granulocytes: 1 %
Lymphocytes Relative: 37 %
Lymphs Abs: 1.2 10*3/uL (ref 0.7–4.0)
MCH: 25.6 pg — ABNORMAL LOW (ref 26.0–34.0)
MCHC: 31.7 g/dL (ref 30.0–36.0)
MCV: 80.9 fL (ref 80.0–100.0)
Monocytes Absolute: 0.6 10*3/uL (ref 0.1–1.0)
Monocytes Relative: 16 %
Neutro Abs: 1.5 10*3/uL — ABNORMAL LOW (ref 1.7–7.7)
Neutrophils Relative %: 43 %
Platelets: 243 10*3/uL (ref 150–400)
RBC: 4.25 MIL/uL (ref 4.22–5.81)
RDW: 15.9 % — ABNORMAL HIGH (ref 11.5–15.5)
WBC: 3.4 10*3/uL — ABNORMAL LOW (ref 4.0–10.5)
nRBC: 0 % (ref 0.0–0.2)

## 2019-06-22 MED ORDER — HEPARIN SOD (PORK) LOCK FLUSH 100 UNIT/ML IV SOLN
500.0000 [IU] | Freq: Once | INTRAVENOUS | Status: AC | PRN
  Administered 2019-06-22: 500 [IU]
  Filled 2019-06-22: qty 5

## 2019-06-22 MED ORDER — SODIUM CHLORIDE 0.9 % IV SOLN
100.0000 mg/m2 | Freq: Once | INTRAVENOUS | Status: AC
  Administered 2019-06-22: 180 mg via INTRAVENOUS
  Filled 2019-06-22: qty 9

## 2019-06-22 MED ORDER — HEPARIN SOD (PORK) LOCK FLUSH 100 UNIT/ML IV SOLN
INTRAVENOUS | Status: AC
  Filled 2019-06-22: qty 5

## 2019-06-22 MED ORDER — SODIUM CHLORIDE 0.9 % IV SOLN
Freq: Once | INTRAVENOUS | Status: AC
  Filled 2019-06-22: qty 5

## 2019-06-22 MED ORDER — SODIUM CHLORIDE 0.9% FLUSH
10.0000 mL | Freq: Once | INTRAVENOUS | Status: AC
  Administered 2019-06-22: 10 mL via INTRAVENOUS
  Filled 2019-06-22: qty 10

## 2019-06-22 MED ORDER — SODIUM CHLORIDE 0.9 % IV SOLN
410.0000 mg | Freq: Once | INTRAVENOUS | Status: AC
  Administered 2019-06-22: 410 mg via INTRAVENOUS
  Filled 2019-06-22: qty 41

## 2019-06-22 MED ORDER — PALONOSETRON HCL INJECTION 0.25 MG/5ML
0.2500 mg | Freq: Once | INTRAVENOUS | Status: AC
  Administered 2019-06-22: 0.25 mg via INTRAVENOUS
  Filled 2019-06-22: qty 5

## 2019-06-22 MED ORDER — SODIUM CHLORIDE 0.9 % IV SOLN
Freq: Once | INTRAVENOUS | Status: AC
  Filled 2019-06-22: qty 250

## 2019-06-22 NOTE — Assessment & Plan Note (Addendum)
# ?   Limited stage small cell cancer-likely lung primary [liver lesion].  TX N2 M0. s/p [place] etoposide-every 3 weeks with concurrent radiation [finished on Oct 1st, 2020]. Dec 7th- PR; stable liver lesion [see discussion below];Patient therapy for small cell lung cancer-interupted because of colon cancer/surgery. Currently on carbo-etop.   # proceed with cycle #4/4 carbo-etop today. Labs today reviewed;  acceptable for treatment today.   #  Liver lesion approximately 1 cm-difficulty biopsy/statistically suspicious of small cell-December 4 CT scan improved.  We will follow-up imaging late February 2021.   # Stage III- colon cancer- hold off adjuvant therapy for now/above reasons.  # Anemia- secondary- sec to chemo- Hb 10.9-STABLE.   # DISPOSITION: # chemo today/this week. # follow up in end of FEB - MD; labs/cbc/cmp/port flush; CT C/A/P- prior-Dr.B.

## 2019-06-22 NOTE — Progress Notes (Signed)
[place] NOTE  Patient Care Team: Laneta Simmers, NP as PCP - General (Nurse Practitioner) Telford Nab, RN as Registered Nurse Clent Jacks, RN as Oncology Nurse Navigator  CHIEF COMPLAINTS/PURPOSE OF CONSULTATION: Lung cancer/colon cancer   Oncology History Overview Note  # July 2020- SMALL CELL CA METASTATIC TO MEDIASTINAL LN [open Bx; Dr.Oaks]; TxN2M0; July 2nd 2020-PET- 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  MRI brain negative  # aug 17th 2020-carbo etoposide-RT [? 8/19]  #August 2020 iron deficient anemia-question etiology; IV Feraheme [no colonoscopy]  # OCT 2020- colo/ Cecal adeno ca; MRI liver- 1 cm enhancing lesion "metastasis" [Dr.Anna/Dr.Cannon]-difficulty biopsy.  November 02-2019 right hemicolectomy- STAGE III [pT3pN1 (2/13LN)]- HOLD adjuvant therapy. MMR- INTACT/LOW  # COPD/  DM-2- on OHA/ smoker/ PVD/peripheral neuropathy  # History of alcohol abuse/quit 2014/ Hx of abdominal trauma [at 21y]  # DIAGNOSIS: SMALL CELL CA [? Lung primary]  STAGE:  limited       ;GOALS: ? 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 [facility])   02/09/2019 -  Chemotherapy   The patient had palonosetron (ALOXI) injection 0.25 mg, 0.25 mg, Intravenous,  Once, 4 of 4 cycles Administration: 0.25 mg (02/09/2019), 0.25 mg (03/03/2019), 0.25 mg (06/02/2019), 0.25 mg (06/22/2019) CARBOplatin (PARAPLATIN) 410 mg in sodium chloride 0.9 % 250 mL chemo infusion, 410 mg (100 % of original dose 414 mg), Intravenous,  Once, 4 of 4 cycles Dose modification:   (original dose 414 mg, Cycle 1) Administration: 410 mg (02/09/2019), 410 mg (03/03/2019), 410 mg (06/02/2019), 410 mg (06/22/2019) etoposide (VEPESID) 180 mg in sodium chloride 0.9 % 500 mL chemo  infusion, 100 mg/m2 = 180 mg, Intravenous,  Once, 4 of 4 cycles Administration: 180 mg (02/09/2019), 180 mg (02/10/2019), 180 mg (02/11/2019), 180 mg (03/03/2019), 180 mg (03/04/2019), 180 mg (03/05/2019), 180 mg (06/02/2019), 180 mg (06/03/2019), 180 mg (06/04/2019), 180 mg (06/22/2019) fosaprepitant (EMEND) 150 mg, dexamethasone (DECADRON) 6 mg in sodium chloride 0.9 % 145 mL IVPB, , Intravenous,  Once, 2 of 2 cycles Administration:  (06/02/2019),  (06/22/2019)  for chemotherapy treatment.     HISTORY OF PRESENTING ILLNESS:  Kevin Mckay 68 y.o.  male limited stage small cell-likely lung primary most recently on concurrent chemoradiation carboplatin etoposide; also new diagnosis of colon cancer-stage III is here for follow-up.  Patient is here for cycle #4 of carbo etoposide approximately 3 weeks ago.  Last cycle of chemotherapy was interrupted because of new diagnosis of colon cancer/surgery.  Tolerated chemotherapy well.  No nausea no vomiting.  Chronic shortness of breath chronic cough.  Chronic fatigue.  No headaches.  Review of Systems  Constitutional: Positive for malaise/fatigue. Negative for chills, diaphoresis, fever and weight loss.  HENT: Negative for nosebleeds and sore throat.   Eyes: Negative for double vision.  Respiratory: Positive for cough, sputum production and shortness of breath. Negative for hemoptysis and wheezing.   Cardiovascular: Negative for chest pain, palpitations, orthopnea and leg swelling.  Gastrointestinal: Negative for abdominal pain, blood in stool, constipation, diarrhea, heartburn, melena, nausea and vomiting.  Genitourinary: Negative for dysuria, frequency and urgency.  Musculoskeletal: Negative for back pain and joint pain.  Skin: Negative.  Negative for itching and rash.  Neurological: Negative for dizziness, focal weakness and weakness.  Endo/Heme/Allergies: Does not bruise/bleed easily.  Psychiatric/Behavioral: Negative for depression. The  patient is not  nervous/anxious and does not have insomnia.      MEDICAL HISTORY:  Past Medical History:  Diagnosis Date  . Asthma   . Cancer ([place])    Metastatic small cell lung cancer  . Cerebral aneurysm   . Chronic painful diabetic neuropathy ([place])   . Depression   . Diabetes mellitus without complication ([place])   . Essential hypertension   . History of kidney stones   . Occasional tremors   . Tobacco use     SURGICAL HISTORY: Past Surgical History:  Procedure Laterality Date  . ABDOMINAL SURGERY     age 51. trauma surgery due to forklift injury- liver and spleen  . COLONOSCOPY WITH PROPOFOL N/A 03/27/2019   Procedure: COLONOSCOPY WITH PROPOFOL;  Surgeon: Jonathon Bellows, MD;  Location: [facility] ENDOSCOPY;  Service: Gastroenterology;  Laterality: N/A;  . COLOSTOMY REVISION Right 05/04/2019   Procedure: COLON RESECTION RIGHT-right hemicolectomy-open;  Surgeon: Fredirick Maudlin, MD;  Location: [facility] ORS;  Service: General;  Laterality: Right;  . ESOPHAGOGASTRODUODENOSCOPY (EGD) WITH PROPOFOL N/A 03/27/2019   Procedure: ESOPHAGOGASTRODUODENOSCOPY (EGD) WITH PROPOFOL;  Surgeon: Jonathon Bellows, MD;  Location: [facility] ENDOSCOPY;  Service: Gastroenterology;  Laterality: N/A;  . IR GENERIC HISTORICAL  05/31/2016   IR ANGIO VERTEBRAL SEL VERTEBRAL BILAT MOD SED 05/31/2016 Consuella Lose, MD [facility]-INTERV RAD  . IR GENERIC HISTORICAL  05/31/2016   IR ANGIO INTRA EXTRACRAN SEL INTERNAL CAROTID BILAT MOD SED 05/31/2016 Consuella Lose, MD [facility]-INTER[facility] RAD  . PORTACATH PLACEMENT Right 02/04/2019   Procedure: INSERTION PORT-A-CATH;  Surgeon: [name], MD;  Location: [facility] ORS;  Service: General;  Laterality: Right;  . THORACOTOMY Left 01/22/2019   Procedure: PRE OP BRONCH LEFT ANTERIOR THORACOTOMY WITH BIOPSY OF MEDIASTINAL MASS;  Surgeon: [name], MD;  Location: [facility] ORS;  Service: General;  Laterality: Left;    SOCIAL HISTORY: Social History   Socioeconomic History  . Marital status: Married    Spouse name: Not  on file  . Number of children: Not on file  . Years of education: Not on file  . Highest education level: Not on file  Occupational History  . Not on file  Tobacco Use  . Smoking status: Current Every Day Smoker    Packs/day: 0.50    Years: 50.00    Pack years: 25.00    Types: Cigarettes  . Smokeless tobacco: Never Used  Substance and Sexual Activity  . Alcohol use: No  . Drug use: Yes    Types: Barbituates, Marijuana  . Sexual activity: Not on file  Other Topics Concern  . Not on file  Social History Narrative   Lives in [place]; wife/ son/grandson [custody]; smoker; vending business; hx of alcoholism- quit at 89.    Social Determinants of Health   Financial Resource Strain: Low Risk   . Difficulty of Paying Living Expenses: Not hard at all  Food Insecurity: No Food Insecurity  . Worried About Charity fundraiser in the Last Year: Never true  . Ran Out of Food in the Last Year: Never true  Transportation Needs: No Transportation Needs  . Lack of Transportation (Medical): No  . Lack of Transportation (Non-Medical): No  Physical Activity: Inactive  . Days of Exercise per Week: 0 days  . Minutes of Exercise per Session: 0 min  Stress: No Stress Concern Present  . Feeling of Stress : Not at all  Social Connections: Unknown  . Frequency of Communication with Friends and Family: Patient refused  . Frequency of Social  Gatherings with Friends and Family: Patient refused  . Attends Religious Services: Patient refused  . Active Member of Clubs or Organizations: Patient refused  . Attends Archivist Meetings: Patient refused  . Marital Status: Patient refused  Intimate Partner Violence: Not At Risk  . Fear of Current or Ex-Partner: No  . Emotionally Abused: No  . Physically Abused: No  . Sexually Abused: No    FAMILY HISTORY: Family History  Problem Relation Age of Onset  . Lymphoma Father   . Liver cancer Paternal Uncle   . Lung cancer Paternal Uncle   .  Lung cancer Maternal Uncle     ALLERGIES:  is allergic to ferumoxytol; amoxicillin; and codeine.  MEDICATIONS:  Current Outpatient Medications  Medication Sig Dispense Refill  . amLODipine (NORVASC) 10 MG tablet Take 10 mg by mouth every morning.     . budesonide-formoterol (SYMBICORT) 160-4.5 MCG/ACT inhaler Inhale 2 puffs into the lungs 2 (two) times daily.    . DULoxetine (CYMBALTA) 30 MG capsule Take 30 mg by mouth every morning.     . ferrous sulfate 324 (65 Fe) MG TBEC Take 324 mg by mouth daily.     Marland Kitchen gabapentin (NEURONTIN) 300 MG capsule Take 600 mg by mouth 3 (three) times daily.     . hydrochlorothiazide (HYDRODIURIL) 25 MG tablet Take 25 mg by mouth daily.     . insulin glargine (LANTUS) 100 UNIT/ML injection Inject into the skin daily. Units based By sliding scale    . lidocaine-prilocaine (EMLA) cream Apply 1 application topically as needed. 30-45 mins prior to port access. 30 g 0  . linaclotide (LINZESS) 145 MCG CAPS capsule Take 145 mcg by mouth daily before breakfast.    . lovastatin (MEVACOR) 20 MG tablet Take 20 mg by mouth every evening.    . metFORMIN (GLUCOPHAGE) 1000 MG tablet Take 1,000 mg by mouth 2 (two) times daily with a meal.    . polyethylene glycol powder (MIRALAX) 17 GM/SCOOP powder Mix full container in 64 ounces of Gatorade or other clear liquid. NO RED Liquids 238 g 0  . prazosin (MINIPRESS) 1 MG capsule Take 1 mg by mouth at bedtime.    . psyllium (METAMUCIL) 58.6 % powder Take 1 packet by mouth daily.    . sitaGLIPtin (JANUVIA) 25 MG tablet Take 25 mg by mouth daily.    . sucralfate (CARAFATE) 1 GM/10ML suspension Take 25m three times daily  30 minutes before meals 420 mL 3  . traMADol (ULTRAM) 50 MG tablet Take 1 tablet (50 mg total) by mouth every 8 (eight) hours as needed. 60 tablet 0  . vitamin B-12 (CYANOCOBALAMIN) 100 MCG tablet Take 100 mcg by mouth daily.    . Multiple Vitamins-Minerals (MULTIVITAMIN WITH MINERALS) tablet Take 1 tablet by mouth  daily.    .Marland Kitchenomeprazole (PRILOSEC) 40 MG capsule Take 1 capsule (40 mg total) by mouth daily. (Patient taking differently: Take 40 mg by mouth every morning. ) 90 capsule 1  . ondansetron (ZOFRAN) 8 MG tablet Take 1 tablet (8 mg total) by mouth every 8 (eight) hours as needed for nausea or vomiting (start 3 days; after chemo). (Patient not taking: Reported on 06/18/2019) 40 tablet 1  . prochlorperazine (COMPAZINE) 10 MG tablet Take 1 tablet (10 mg total) by mouth every 6 (six) hours as needed for nausea or vomiting. (Patient not taking: Reported on 06/18/2019) 40 tablet 1   No current facility-administered medications for this visit.      .Marland Kitchen  PHYSICAL EXAMINATION: ECOG PERFORMANCE STATUS: 0 - Asymptomatic  Vitals:   06/22/19 0840  BP: (!) 160/69  Pulse: 73  Resp: 16  Temp: (!) 96.6 F (35.9 C)  SpO2: 98%   Filed Weights   06/22/19 0840  Weight: 142 lb 3.2 oz (64.5 kg)    Physical Exam  Constitutional: He is oriented to person, place, and time and well-developed, well-nourished, and in no distress.  HENT:  Head: Normocephalic and atraumatic.  Mouth/Throat: Oropharynx is clear and moist. No oropharyngeal exudate.  Eyes: Pupils are equal, round, and reactive to light.  Cardiovascular: Normal rate and regular rhythm.  Pulmonary/Chest: No respiratory distress. He has no wheezes.  Decreased air entry bilaterally.  Abdominal: Soft. Bowel sounds are normal. He exhibits no distension and no mass. There is no abdominal tenderness. There is no rebound and no guarding.  Abdominal incision well-healed.  Musculoskeletal:        General: No tenderness or edema. Normal range of motion.     Cervical back: Normal range of motion and neck supple.  Neurological: He is alert and oriented to person, place, and time.  Skin: Skin is warm.  Psychiatric: Affect normal.    LABORATORY DATA:  I have reviewed the data as listed Lab Results  Component Value Date   WBC 3.4 (L) 06/22/2019   HGB 10.9  (L) 06/22/2019   HCT 34.4 (L) 06/22/2019   MCV 80.9 06/22/2019   PLT 243 06/22/2019   Recent Labs    05/19/19 1341 06/02/19 0840 06/22/19 0825  NA 131* 135 133*  K 3.7 3.8 3.7  CL 97* 100 97*  CO2 _0 GLUCOSE 253* 273* 287*  BUN _1 CREATININE 1.17 1.05 1.04  CALCIUM 8.8* 9.3 9.3  GFRNONAA >60 >60 >60  GFRAA >60 >60 >60  PROT 7.1 6.9 7.1  ALBUMIN 3.8 3.9 3.9  AST 16 14* 16  ALT _2 ALKPHOS 69 58 70  BILITOT 0.4 0.4 0.4    RADIOGRAPHIC STUDIES: I have personally reviewed the radiological images as listed and agreed with the findings in the report. CT Chest W Contrast  Addendum Date: 06/01/2019   ADDENDUM REPORT: 06/01/2019 13:42 ADDENDUM: After the initial dictation, the presence of a right hepatic lobe lesion came to my attention. Today, this is only subtly apparent, suboptimally evaluated due to bolus timing. example at 7 mm on 131/2. Compared to the abdominal CT of 04/06/2019, likely minimally decreased in size. 10 mm on that exam. Electronically Signed   By: Abigail Miyamoto M.D.   On: 06/01/2019 13:42   Result Date: 06/01/2019 CLINICAL DATA:  limited stage small-cell lung cancer. Status post chemotherapy and radiation therapy. New diagnosis of colon cancer. EXAM: CT CHEST WITH CONTRAST TECHNIQUE: Multidetector CT imaging of the chest was performed during intravenous contrast administration. CONTRAST:  55m OMNIPAQUE IOHEXOL 300 MG/ML  SOLN COMPARISON:  01/23/2019 chest radiograph. Most recent chest CT 11/25/2018. FINDINGS: Cardiovascular: Right Port-A-Cath tip at superior caval/atrial junction. Aortic atherosclerosis. Tortuous thoracic aorta. Normal heart size, without pericardial effusion. Multivessel coronary artery atherosclerosis. No central pulmonary embolism, on this non-dedicated study. Mediastinum/Nodes: No supraclavicular adenopathy. The AP window lesion measures 1.9 x 0.8 cm on 57/2. Compare 2.9 x 2.4 cm on the prior diagnostic CT (when remeasured). No  hilar adenopathy. Lungs/Pleura: No pleural fluid. Presumed secretions within the bronchus intermedius. Mild centrilobular emphysema. A right upper lobe 2 mm subpleural pulmonary nodule is unchanged. Upper Abdomen: Absence of the pancreatic body  and tail. Normal imaged portions of the liver, gallbladder, spleen, right adrenal gland, kidneys. Mild left adrenal thickening and nodularity are unchanged. Prominent left upper quadrant collaterals are again identified. Abdominal aortic atherosclerosis. Musculoskeletal: No acute osseous abnormality. IMPRESSION: 1. Response to therapy of AP window lesion. 2. No new or progressive disease. 3. Aortic atherosclerosis (ICD10-I70.0), coronary artery atherosclerosis and emphysema (ICD10-J43.9). Electronically Signed: By: Abigail Miyamoto M.D. 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 Limited stage small cell cancer-likely lung primary [liver lesion].  TX N2 M0. s/p [place] etoposide-every 3 weeks with concurrent radiation [finished on Oct 1st, 2020]. Dec 7th- PR; stable liver lesion [see discussion below];Patient therapy for small cell lung cancer-interupted because of colon cancer/surgery. Currently on carbo-etop.   # proceed with cycle #4/4 carbo-etop today. Labs today reviewed;  acceptable for treatment today.   #  Liver lesion approximately 1 cm-difficulty biopsy/statistically suspicious of small cell-December 4 CT scan improved.  We will follow-up imaging late February 2021.   # Stage III- colon cancer- hold off adjuvant therapy for now/above reasons.  # Anemia- secondary- sec to chemo- Hb 10.9-STABLE.   # DISPOSITION: # chemo today/this week. # follow up in end of FEB - MD; labs/cbc/cmp/port flush; CT C/A/P- prior-Dr.B.     All questions were answered. The patient knows to call the clinic with any problems, questions or concerns.    Cammie Sickle,  MD 06/22/2019 1:09 PM

## 2019-06-23 ENCOUNTER — Other Ambulatory Visit: Payer: Self-pay

## 2019-06-23 ENCOUNTER — Inpatient Hospital Stay: Payer: BC Managed Care – PPO

## 2019-06-23 VITALS — BP 129/70 | HR 71 | Temp 97.2°F | Resp 18

## 2019-06-23 DIAGNOSIS — C801 Malignant (primary) neoplasm, unspecified: Secondary | ICD-10-CM

## 2019-06-23 DIAGNOSIS — C781 Secondary malignant neoplasm of mediastinum: Secondary | ICD-10-CM

## 2019-06-23 DIAGNOSIS — Z5111 Encounter for antineoplastic chemotherapy: Secondary | ICD-10-CM | POA: Diagnosis not present

## 2019-06-23 MED ORDER — SODIUM CHLORIDE 0.9 % IV SOLN
100.0000 mg/m2 | Freq: Once | INTRAVENOUS | Status: AC
  Administered 2019-06-23: 180 mg via INTRAVENOUS
  Filled 2019-06-23: qty 9

## 2019-06-23 MED ORDER — SODIUM CHLORIDE 0.9% FLUSH
10.0000 mL | INTRAVENOUS | Status: DC | PRN
  Administered 2019-06-23: 10 mL
  Filled 2019-06-23: qty 10

## 2019-06-23 MED ORDER — DEXAMETHASONE SODIUM PHOSPHATE 10 MG/ML IJ SOLN
4.0000 mg | Freq: Once | INTRAMUSCULAR | Status: AC
  Administered 2019-06-23: 4 mg via INTRAVENOUS
  Filled 2019-06-23: qty 1

## 2019-06-23 MED ORDER — SODIUM CHLORIDE 0.9 % IV SOLN
Freq: Once | INTRAVENOUS | Status: AC
  Filled 2019-06-23: qty 250

## 2019-06-23 MED ORDER — HEPARIN SOD (PORK) LOCK FLUSH 100 UNIT/ML IV SOLN
500.0000 [IU] | Freq: Once | INTRAVENOUS | Status: AC | PRN
  Administered 2019-06-23: 12:00:00 500 [IU]
  Filled 2019-06-23: qty 5

## 2019-06-23 MED ORDER — HEPARIN SOD (PORK) LOCK FLUSH 100 UNIT/ML IV SOLN
INTRAVENOUS | Status: AC
  Filled 2019-06-23: qty 5

## 2019-06-24 ENCOUNTER — Other Ambulatory Visit: Payer: Self-pay

## 2019-06-24 ENCOUNTER — Inpatient Hospital Stay: Payer: BC Managed Care – PPO

## 2019-06-24 VITALS — BP 159/67 | HR 68 | Temp 97.0°F | Resp 18

## 2019-06-24 DIAGNOSIS — Z5111 Encounter for antineoplastic chemotherapy: Secondary | ICD-10-CM | POA: Diagnosis not present

## 2019-06-24 DIAGNOSIS — C781 Secondary malignant neoplasm of mediastinum: Secondary | ICD-10-CM

## 2019-06-24 DIAGNOSIS — C801 Malignant (primary) neoplasm, unspecified: Secondary | ICD-10-CM

## 2019-06-24 MED ORDER — SODIUM CHLORIDE 0.9 % IV SOLN
100.0000 mg/m2 | Freq: Once | INTRAVENOUS | Status: AC
  Administered 2019-06-24: 180 mg via INTRAVENOUS
  Filled 2019-06-24: qty 9

## 2019-06-24 MED ORDER — HEPARIN SOD (PORK) LOCK FLUSH 100 UNIT/ML IV SOLN
500.0000 [IU] | Freq: Once | INTRAVENOUS | Status: AC | PRN
  Administered 2019-06-24: 11:00:00 500 [IU]
  Filled 2019-06-24: qty 5

## 2019-06-24 MED ORDER — DEXAMETHASONE SODIUM PHOSPHATE 10 MG/ML IJ SOLN
4.0000 mg | Freq: Once | INTRAMUSCULAR | Status: AC
  Administered 2019-06-24: 4 mg via INTRAVENOUS
  Filled 2019-06-24: qty 1

## 2019-06-24 MED ORDER — HEPARIN SOD (PORK) LOCK FLUSH 100 UNIT/ML IV SOLN
INTRAVENOUS | Status: AC
  Filled 2019-06-24: qty 5

## 2019-06-24 MED ORDER — SODIUM CHLORIDE 0.9 % IV SOLN
Freq: Once | INTRAVENOUS | Status: AC
  Filled 2019-06-24: qty 250

## 2019-08-06 ENCOUNTER — Ambulatory Visit: Payer: BC Managed Care – PPO | Admitting: Radiation Oncology

## 2019-08-19 ENCOUNTER — Other Ambulatory Visit: Payer: Self-pay

## 2019-08-20 ENCOUNTER — Other Ambulatory Visit: Payer: Self-pay

## 2019-08-20 ENCOUNTER — Ambulatory Visit
Admission: RE | Admit: 2019-08-20 | Discharge: 2019-08-20 | Disposition: A | Discharge status: Home / Self Care | Payer: BC Managed Care – PPO | Source: Ambulatory Visit | Attending: Internal Medicine | Admitting: Internal Medicine

## 2019-08-20 ENCOUNTER — Inpatient Hospital Stay: Payer: BC Managed Care – PPO | Attending: Internal Medicine

## 2019-08-20 DIAGNOSIS — Z808 Family history of malignant neoplasm of other organs or systems: Secondary | ICD-10-CM | POA: Diagnosis not present

## 2019-08-20 DIAGNOSIS — Z79899 Other long term (current) drug therapy: Secondary | ICD-10-CM | POA: Insufficient documentation

## 2019-08-20 DIAGNOSIS — I739 Peripheral vascular disease, unspecified: Secondary | ICD-10-CM | POA: Diagnosis not present

## 2019-08-20 DIAGNOSIS — C180 Malignant neoplasm of cecum: Secondary | ICD-10-CM | POA: Insufficient documentation

## 2019-08-20 DIAGNOSIS — D6481 Anemia due to antineoplastic chemotherapy: Secondary | ICD-10-CM | POA: Diagnosis not present

## 2019-08-20 DIAGNOSIS — I700 Atherosclerosis of aorta: Secondary | ICD-10-CM | POA: Diagnosis not present

## 2019-08-20 DIAGNOSIS — C801 Malignant (primary) neoplasm, unspecified: Secondary | ICD-10-CM | POA: Insufficient documentation

## 2019-08-20 DIAGNOSIS — Z885 Allergy status to narcotic agent status: Secondary | ICD-10-CM | POA: Insufficient documentation

## 2019-08-20 DIAGNOSIS — R5383 Other fatigue: Secondary | ICD-10-CM | POA: Insufficient documentation

## 2019-08-20 DIAGNOSIS — K769 Liver disease, unspecified: Secondary | ICD-10-CM | POA: Diagnosis not present

## 2019-08-20 DIAGNOSIS — G629 Polyneuropathy, unspecified: Secondary | ICD-10-CM | POA: Diagnosis not present

## 2019-08-20 DIAGNOSIS — F17210 Nicotine dependence, cigarettes, uncomplicated: Secondary | ICD-10-CM | POA: Diagnosis not present

## 2019-08-20 DIAGNOSIS — Z800 Family history of malignant neoplasm of digestive organs: Secondary | ICD-10-CM | POA: Insufficient documentation

## 2019-08-20 DIAGNOSIS — J449 Chronic obstructive pulmonary disease, unspecified: Secondary | ICD-10-CM | POA: Diagnosis not present

## 2019-08-20 DIAGNOSIS — E119 Type 2 diabetes mellitus without complications: Secondary | ICD-10-CM | POA: Diagnosis not present

## 2019-08-20 DIAGNOSIS — C771 Secondary and unspecified malignant neoplasm of intrathoracic lymph nodes: Secondary | ICD-10-CM | POA: Diagnosis not present

## 2019-08-20 DIAGNOSIS — Z801 Family history of malignant neoplasm of trachea, bronchus and lung: Secondary | ICD-10-CM | POA: Diagnosis not present

## 2019-08-20 DIAGNOSIS — Z880 Allergy status to penicillin: Secondary | ICD-10-CM | POA: Insufficient documentation

## 2019-08-20 DIAGNOSIS — Z87442 Personal history of urinary calculi: Secondary | ICD-10-CM | POA: Diagnosis not present

## 2019-08-20 DIAGNOSIS — C781 Secondary malignant neoplasm of mediastinum: Secondary | ICD-10-CM | POA: Insufficient documentation

## 2019-08-20 DIAGNOSIS — I2510 Atherosclerotic heart disease of native coronary artery without angina pectoris: Secondary | ICD-10-CM | POA: Insufficient documentation

## 2019-08-20 LAB — COMPREHENSIVE METABOLIC PANEL
ALT: 12 U/L (ref 0–44)
AST: 15 U/L (ref 15–41)
Albumin: 4.1 g/dL (ref 3.5–5.0)
Alkaline Phosphatase: 52 U/L (ref 38–126)
Anion gap: 9 (ref 5–15)
BUN: 26 mg/dL — ABNORMAL HIGH (ref 8–23)
CO2: 31 mmol/L (ref 22–32)
Calcium: 9.2 mg/dL (ref 8.9–10.3)
Chloride: 99 mmol/L (ref 98–111)
Creatinine, Ser: 1.04 mg/dL (ref 0.61–1.24)
GFR calc Af Amer: >60 mL/min (ref >60)
GFR calc non Af Amer: >60 mL/min (ref >60)
Glucose, Bld: 175 mg/dL — ABNORMAL HIGH (ref 70–99)
Potassium: 3.9 mmol/L (ref 3.5–5.1)
Sodium: 139 mmol/L (ref 135–145)
Total Bilirubin: 0.5 mg/dL (ref 0.3–1.2)
Total Protein: 7.1 g/dL (ref 6.5–8.1)

## 2019-08-20 LAB — CBC WITH DIFFERENTIAL/PLATELET
Abs Immature Granulocytes: 0.01 10*3/uL (ref 0.00–0.07)
Basophils Absolute: 0.1 10*3/uL (ref 0.0–0.1)
Basophils Relative: 1 %
Eosinophils Absolute: 0.5 10*3/uL (ref 0.0–0.5)
Eosinophils Relative: 8 %
HCT: 34.5 % — ABNORMAL LOW (ref 39.0–52.0)
Hemoglobin: 10.9 g/dL — ABNORMAL LOW (ref 13.0–17.0)
Immature Granulocytes: 0 %
Lymphocytes Relative: 22 %
Lymphs Abs: 1.2 10*3/uL (ref 0.7–4.0)
MCH: 26.5 pg (ref 26.0–34.0)
MCHC: 31.6 g/dL (ref 30.0–36.0)
MCV: 83.7 fL (ref 80.0–100.0)
Monocytes Absolute: 0.5 10*3/uL (ref 0.1–1.0)
Monocytes Relative: 9 %
Neutro Abs: 3.4 10*3/uL (ref 1.7–7.7)
Neutrophils Relative %: 60 %
Platelets: 155 10*3/uL (ref 150–400)
RBC: 4.12 MIL/uL — ABNORMAL LOW (ref 4.22–5.81)
RDW: 18.5 % — ABNORMAL HIGH (ref 11.5–15.5)
WBC: 5.6 10*3/uL (ref 4.0–10.5)
nRBC: 0 % (ref 0.0–0.2)

## 2019-08-20 MED ORDER — SODIUM CHLORIDE 0.9% FLUSH
10.0000 mL | Freq: Once | INTRAVENOUS | Status: AC
  Administered 2019-08-20: 08:00:00 10 mL via INTRAVENOUS
  Filled 2019-08-20: qty 10

## 2019-08-20 MED ORDER — HEPARIN SOD (PORK) LOCK FLUSH 100 UNIT/ML IV SOLN
INTRAVENOUS | Status: AC
  Filled 2019-08-20: qty 5

## 2019-08-20 MED ORDER — HEPARIN SOD (PORK) LOCK FLUSH 100 UNIT/ML IV SOLN
500.0000 [IU] | Freq: Once | INTRAVENOUS | Status: AC
  Administered 2019-08-20: 500 [IU] via INTRAVENOUS
  Filled 2019-08-20: qty 5

## 2019-08-20 MED ORDER — IOHEXOL 300 MG/ML  SOLN
100.0000 mL | Freq: Once | INTRAMUSCULAR | Status: AC | PRN
  Administered 2019-08-20: 100 mL via INTRAVENOUS

## 2019-08-21 ENCOUNTER — Ambulatory Visit
Admission: RE | Admit: 2019-08-21 | Discharge: 2019-08-21 | Disposition: A | Discharge status: Home / Self Care | Payer: BC Managed Care – PPO | Source: Ambulatory Visit | Attending: Radiation Oncology | Admitting: Radiation Oncology

## 2019-08-21 ENCOUNTER — Inpatient Hospital Stay (HOSPITAL_BASED_OUTPATIENT_CLINIC_OR_DEPARTMENT_OTHER): Payer: BC Managed Care – PPO | Admitting: Internal Medicine

## 2019-08-21 ENCOUNTER — Encounter: Payer: Self-pay | Admitting: Internal Medicine

## 2019-08-21 ENCOUNTER — Other Ambulatory Visit: Payer: Self-pay

## 2019-08-21 VITALS — BP 176/79 | HR 74 | Temp 95.6°F | Resp 18 | Wt 147.6 lb

## 2019-08-21 DIAGNOSIS — C781 Secondary malignant neoplasm of mediastinum: Secondary | ICD-10-CM | POA: Insufficient documentation

## 2019-08-21 DIAGNOSIS — Z923 Personal history of irradiation: Secondary | ICD-10-CM | POA: Diagnosis not present

## 2019-08-21 DIAGNOSIS — C801 Malignant (primary) neoplasm, unspecified: Secondary | ICD-10-CM

## 2019-08-21 DIAGNOSIS — F17210 Nicotine dependence, cigarettes, uncomplicated: Secondary | ICD-10-CM | POA: Insufficient documentation

## 2019-08-21 DIAGNOSIS — K769 Liver disease, unspecified: Secondary | ICD-10-CM | POA: Diagnosis not present

## 2019-08-21 NOTE — Assessment & Plan Note (Addendum)
# ?   Limited stage small cell cancer-likely lung primary [liver lesion].  TX N2 M0. s/p [place] etoposide-every 3 weeks with concurrent radiation x4 cycles.  CT scan August 20, 2019-improved/stable mediastinal lymphadenopathy.  No new lung lesions; slightly increasing peripheral dome liver lesion [see below]  #With regards to small cell lung cancer-recommend surveillance at this time.  Hold off any therapies at this time.  #  Liver lesion approximately 1.2-1.3 centimeters currently; from approximately 1 cm on MRI in November.  Slightly worse.  Difficulty biopsy given the location.  I would recommend monitoring closely with a repeat MRI in 2 months.  # Stage III- colon cancer-no adjuvant therapy for above reasons.  No evidence of recurrence.  # Anemia- secondary- sec to chemo- Hb 10.9-STABLE.   # # I discussed regarding Covid-19 precautions.  I reviewed the vaccine effectiveness and potential side effects in detail.  Also discussed long-term effectiveness and safety profile are unclear at this time.  I discussed December, 2020 [employer] position statement-that all patients are recommended COVID-19 vaccinations [when available]-as long as they do not have allergy to components of the vaccine.  However, I think the benefits of the vaccination outweigh the potential risks. Re: [id number] vaccination.  # DISPOSITION: # follow up in end of April 2021 - MD; labs/cbc/cmp/port flush; MRI liver prior-Dr.B.   # I reviewed the blood work- with the patient in detail; also reviewed the imaging independently [as summarized above]; and with the patient in detail.

## 2019-08-21 NOTE — Progress Notes (Signed)
[place] NOTE  Patient Care Team: Laneta Simmers, NP as PCP - General (Nurse Practitioner) Telford Nab, RN as Registered Nurse Clent Jacks, RN as Oncology Nurse Navigator  CHIEF COMPLAINTS/PURPOSE OF CONSULTATION: Lung cancer/colon cancer   Oncology History Overview Note  # July 2020- SMALL CELL CA METASTATIC TO MEDIASTINAL LN [open Bx; Dr.Oaks]; TxN2M0; July 2nd 2020-PET- 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  MRI brain negative  # aug 17th 2020-carbo etoposide-RT [? 8/19]; #4 cycle carbo-Etop [finished dec 30th,2020]  #August 2020 iron deficient anemia-question etiology; IV Feraheme [no colonoscopy]  # OCT 2020- colo/ Cecal adeno ca; MRI liver- 1 cm enhancing lesion "metastasis" [Dr.Anna/Dr.Cannon]-difficulty biopsy.  November 02-2019 right hemicolectomy- STAGE III [pT3pN1 (2/13LN)]- HOLD adjuvant therapy. MMR- INTACT/LOW  # COPD/  DM-2- on OHA/ smoker/ PVD/peripheral neuropathy  # History of alcohol abuse/quit 2014/ Hx of abdominal trauma [at 21y]  # DIAGNOSIS: SMALL CELL CA [? Lung primary]  STAGE:  limited       ;GOALS: ? 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 [facility])   02/09/2019 -  Chemotherapy   The patient had palonosetron (ALOXI) injection 0.25 mg, 0.25 mg, Intravenous,  Once, 4 of 4 cycles Administration: 0.25 mg (02/09/2019), 0.25 mg (03/03/2019), 0.25 mg (06/02/2019), 0.25 mg (06/22/2019) CARBOplatin (PARAPLATIN) 410 mg in sodium chloride 0.9 % 250 mL chemo infusion, 410 mg (100 % of original dose 414 mg), Intravenous,  Once, 4 of 4 cycles Dose modification:   (original dose 414 mg, Cycle 1) Administration: 410 mg (02/09/2019), 410 mg (03/03/2019), 410 mg (06/02/2019), 410 mg (06/22/2019) etoposide (VEPESID) 180  mg in sodium chloride 0.9 % 500 mL chemo infusion, 100 mg/m2 = 180 mg, Intravenous,  Once, 4 of 4 cycles Administration: 180 mg (02/09/2019), 180 mg (02/10/2019), 180 mg (02/11/2019), 180 mg (03/03/2019), 180 mg (03/04/2019), 180 mg (03/05/2019), 180 mg (06/02/2019), 180 mg (06/03/2019), 180 mg (06/04/2019), 180 mg (06/22/2019), 180 mg (06/23/2019), 180 mg (06/24/2019) fosaprepitant (EMEND) 150 mg, dexamethasone (DECADRON) 6 mg in sodium chloride 0.9 % 145 mL IVPB, , Intravenous,  Once, 2 of 2 cycles Administration:  (06/02/2019),  (06/22/2019)  for chemotherapy treatment.     HISTORY OF PRESENTING ILLNESS:  Kevin Mckay 69 y.o.  male limited stage small cell-likely lung primary s/p concurrent chemoradiation carboplatin etoposide; also interim diagnosis of colon cancer-stage III is here for follow-up.  Patient denies any nausea vomiting.  Denies any abdominal pain.  No blood in stools or black or stools.  No headaches.  Patient states to be fairly active in the yard.   Review of Systems  Constitutional: Positive for malaise/fatigue. Negative for chills, diaphoresis, fever and weight loss.  HENT: Negative for nosebleeds and sore throat.   Eyes: Negative for double vision.  Respiratory: Positive for cough, sputum production and shortness of breath. Negative for hemoptysis and wheezing.   Cardiovascular: Negative for chest pain, palpitations, orthopnea and leg swelling.  Gastrointestinal: Negative for abdominal pain, blood in stool, constipation, diarrhea, heartburn, melena, nausea and vomiting.  Genitourinary: Negative for dysuria, frequency and urgency.  Musculoskeletal: Negative for back pain and joint pain.  Skin: Negative.  Negative for itching and rash.  Neurological: Negative for dizziness, focal weakness and weakness.  Endo/Heme/Allergies: Does not bruise/bleed easily.  Psychiatric/Behavioral: Negative for depression. The patient is not nervous/anxious and does  not have insomnia.       MEDICAL HISTORY:  Past Medical History:  Diagnosis Date  . Asthma   . Cancer ([place])    Metastatic small cell lung cancer  . Cerebral aneurysm   . Chronic painful diabetic neuropathy ([place])   . Depression   . Diabetes mellitus without complication ([place])   . Essential hypertension   . History of kidney stones   . Occasional tremors   . Tobacco use     SURGICAL HISTORY: Past Surgical History:  Procedure Laterality Date  . ABDOMINAL SURGERY     age 73. trauma surgery due to forklift injury- liver and spleen  . COLONOSCOPY WITH PROPOFOL N/A 03/27/2019   Procedure: COLONOSCOPY WITH PROPOFOL;  Surgeon: Jonathon Bellows, MD;  Location: [facility] ENDOSCOPY;  Service: Gastroenterology;  Laterality: N/A;  . COLOSTOMY REVISION Right 05/04/2019   Procedure: COLON RESECTION RIGHT-right hemicolectomy-open;  Surgeon: Fredirick Maudlin, MD;  Location: [facility] ORS;  Service: General;  Laterality: Right;  . ESOPHAGOGASTRODUODENOSCOPY (EGD) WITH PROPOFOL N/A 03/27/2019   Procedure: ESOPHAGOGASTRODUODENOSCOPY (EGD) WITH PROPOFOL;  Surgeon: Jonathon Bellows, MD;  Location: [facility] ENDOSCOPY;  Service: Gastroenterology;  Laterality: N/A;  . IR GENERIC HISTORICAL  05/31/2016   IR ANGIO VERTEBRAL SEL VERTEBRAL BILAT MOD SED 05/31/2016 Consuella Lose, MD [facility]-INTERV RAD  . IR GENERIC HISTORICAL  05/31/2016   IR ANGIO INTRA EXTRACRAN SEL INTERNAL CAROTID BILAT MOD SED 05/31/2016 Consuella Lose, MD [facility]INTER[facility]  . PORTACATH PLACEMENT Right 02/04/2019   Procedure: INSERTION PORT-A-CATH;  Surgeon: [name], MD;  Location: [facility] ORS;  Service: General;  Laterality: Right;  . THORACOTOMY Left 01/22/2019   Procedure: PRE OP BRONCH LEFT ANTERIOR THORACOTOMY WITH BIOPSY OF MEDIASTINAL MASS;  Surgeon: [name], MD;  Location: [facility] ORS;  Service: General;  Laterality: Left;    SOCIAL HISTORY: Social History   Socioeconomic History  . Marital status: Married    Spouse name: Not on file  . Number of children: Not on file  .  Years of education: Not on file  . Highest education level: Not on file  Occupational History  . Not on file  Tobacco Use  . Smoking status: Current Every Day Smoker    Packs/day: 0.50    Years: 50.00    Pack years: 25.00    Types: Cigarettes  . Smokeless tobacco: Never Used  Substance and Sexual Activity  . Alcohol use: No  . Drug use: Yes    Types: Barbituates, Marijuana  . Sexual activity: Not on file  Other Topics Concern  . Not on file  Social History Narrative   Lives in [place]; wife/ son/grandson [custody]; smoker; vending business; hx of alcoholism- quit at 89.    Social Determinants of Health   Financial Resource Strain: Low Risk   . Difficulty of Paying Living Expenses: Not hard at all  Food Insecurity: No Food Insecurity  . Worried About Charity fundraiser in the Last Year: Never true  . Ran Out of Food in the Last Year: Never true  Transportation Needs: No Transportation Needs  . Lack of Transportation (Medical): No  . Lack of Transportation (Non-Medical): No  Physical Activity: Inactive  . Days of Exercise per Week: 0 days  . Minutes of Exercise per Session: 0 min  Stress: No Stress Concern Present  . Feeling of Stress : Not at all  Social Connections: Unknown  . Frequency of Communication with Friends and Family: Patient refused  . Frequency of Social Gatherings with Friends and Family: Patient  refused  . Attends Religious Services: Patient refused  . Active Member of Clubs or Organizations: Patient refused  . Attends Archivist Meetings: Patient refused  . Marital Status: Patient refused  Intimate Partner Violence: Not At Risk  . Fear of Current or Ex-Partner: No  . Emotionally Abused: No  . Physically Abused: No  . Sexually Abused: No    FAMILY HISTORY: Family History  Problem Relation Age of Onset  . Lymphoma Father   . Liver cancer Paternal Uncle   . Lung cancer Paternal Uncle   . Lung cancer Maternal Uncle     ALLERGIES:  is  allergic to ferumoxytol; amoxicillin; and codeine.  MEDICATIONS:  Current Outpatient Medications  Medication Sig Dispense Refill  . amLODipine (NORVASC) 10 MG tablet Take 10 mg by mouth every morning.     . budesonide-formoterol (SYMBICORT) 160-4.5 MCG/ACT inhaler Inhale 2 puffs into the lungs 2 (two) times daily.    . DULoxetine (CYMBALTA) 30 MG capsule Take 30 mg by mouth every morning.     . ferrous sulfate 324 (65 Fe) MG TBEC Take 324 mg by mouth daily.     Marland Kitchen gabapentin (NEURONTIN) 300 MG capsule Take 600 mg by mouth 3 (three) times daily.     . hydrochlorothiazide (HYDRODIURIL) 25 MG tablet Take 25 mg by mouth daily.     . insulin glargine (LANTUS) 100 UNIT/ML injection Inject into the skin daily. Units based By sliding scale    . lidocaine-prilocaine (EMLA) cream Apply 1 application topically as needed. 30-45 mins prior to port access. 30 g 0  . linaclotide (LINZESS) 145 MCG CAPS capsule Take 145 mcg by mouth daily before breakfast.    . lovastatin (MEVACOR) 20 MG tablet Take 20 mg by mouth every evening.    . metFORMIN (GLUCOPHAGE) 1000 MG tablet Take 1,000 mg by mouth 2 (two) times daily with a meal.    . polyethylene glycol powder (MIRALAX) 17 GM/SCOOP powder Mix full container in 64 ounces of Gatorade or other clear liquid. NO RED Liquids 238 g 0  . prazosin (MINIPRESS) 1 MG capsule Take 1 mg by mouth at bedtime.    . psyllium (METAMUCIL) 58.6 % powder Take 1 packet by mouth daily.    . sitaGLIPtin (JANUVIA) 25 MG tablet Take 25 mg by mouth daily.    . Ipratropium-Albuterol (COMBIVENT) 20-100 MCG/ACT AERS respimat Inhale into the lungs.    . Multiple Vitamins-Minerals (MULTIVITAMIN WITH MINERALS) tablet Take 1 tablet by mouth daily.    Marland Kitchen omeprazole (PRILOSEC) 40 MG capsule Take 1 capsule (40 mg total) by mouth daily. (Patient taking differently: Take 40 mg by mouth every morning. ) 90 capsule 1  . ondansetron (ZOFRAN) 8 MG tablet Take 1 tablet (8 mg total) by mouth every 8 (eight)  hours as needed for nausea or vomiting (start 3 days; after chemo). (Patient not taking: Reported on 06/18/2019) 40 tablet 1  . prochlorperazine (COMPAZINE) 10 MG tablet Take 1 tablet (10 mg total) by mouth every 6 (six) hours as needed for nausea or vomiting. (Patient not taking: Reported on 06/18/2019) 40 tablet 1  . sucralfate (CARAFATE) 1 GM/10ML suspension Take 43m three times daily  30 minutes before meals (Patient not taking: Reported on 08/21/2019) 420 mL 3  . vitamin B-12 (CYANOCOBALAMIN) 100 MCG tablet Take 100 mcg by mouth daily.     No current facility-administered medications for this visit.      .Marland Kitchen PHYSICAL EXAMINATION: ECOG PERFORMANCE STATUS: 0 - Asymptomatic  Vitals:   08/21/19 1003  BP: (!) 176/79  Pulse: 74  Resp: 18  Temp: (!) 95.6 F (35.3 C)  SpO2: 97%   Filed Weights   08/21/19 1003  Weight: 147 lb 9.6 oz (67 kg)    Physical Exam  Constitutional: He is oriented to person, place, and time and well-developed, well-nourished, and in no distress.  HENT:  Head: Normocephalic and atraumatic.  Mouth/Throat: Oropharynx is clear and moist. No oropharyngeal exudate.  Eyes: Pupils are equal, round, and reactive to light.  Cardiovascular: Normal rate and regular rhythm.  Pulmonary/Chest: No respiratory distress. He has no wheezes.  Decreased air entry bilaterally.  Abdominal: Soft. Bowel sounds are normal. He exhibits no distension and no mass. There is no abdominal tenderness. There is no rebound and no guarding.  Abdominal incision well-healed.  Musculoskeletal:        General: No tenderness or edema. Normal range of motion.     Cervical back: Normal range of motion and neck supple.  Neurological: He is alert and oriented to person, place, and time.  Skin: Skin is warm.  Psychiatric: Affect normal.    LABORATORY DATA:  I have reviewed the data as listed Lab Results  Component Value Date   WBC 5.6 08/20/2019   HGB 10.9 (L) 08/20/2019   HCT 34.5 (L)  08/20/2019   MCV 83.7 08/20/2019   PLT 155 08/20/2019   Recent Labs    06/02/19 0840 06/22/19 0825 08/20/19 0806  NA 135 133* 139  K 3.8 3.7 3.9  CL 100 97* 99  CO2 '27 23 31  ' GLUCOSE 273* 287* 175*  BUN 16 17 26*  CREATININE 1.05 1.04 1.04  CALCIUM 9.3 9.3 9.2  GFRNONAA >60 >60 >60  GFRAA >60 >60 >60  PROT 6.9 7.1 7.1  ALBUMIN 3.9 3.9 4.1  AST 14* 16 15  ALT '12 13 12  ' ALKPHOS 58 70 52  BILITOT 0.4 0.4 0.5    RADIOGRAPHIC STUDIES: I have personally reviewed the radiological images as listed and agreed with the findings in the report. CT Chest W Contrast  Result Date: 08/20/2019 CLINICAL DATA:  Small-cell lung cancer, synchronous colon cancer EXAM: CT CHEST, ABDOMEN, AND PELVIS WITH CONTRAST TECHNIQUE: Multidetector CT imaging of the chest, abdomen and pelvis was performed following the standard protocol during bolus administration of intravenous contrast. CONTRAST:  176m OMNIPAQUE IOHEXOL 300 MG/ML SOLN, additional oral enteric contrast COMPARISON:  CT chest, 05/29/2019, CT abdomen pelvis, 04/06/2019, MR abdomen pelvis, 04/08/2019, PET-CT, 12/25/2018 FINDINGS: CT CHEST FINDINGS Cardiovascular: Right chest port catheter. Aortic atherosclerosis. Normal heart size. Three-vessel coronary artery calcifications. No pericardial effusion. Mediastinum/Nodes: Minimal residual soft tissue of the AP window at the previous site of an enlarged, PET avid lymph node, now measuring approximately 2.0 x 0.6 cm, slightly decreased in size compared to prior (series 504, image 23). No discretely enlarged mediastinal or hilar lymph nodes. Thyroid gland, trachea, and esophagus demonstrate no significant findings. Lungs/Pleura: There is new irregular ground-glass opacity of the suprahilar left upper lobe (series 505, image 49). No pleural effusion or pneumothorax. Musculoskeletal: No chest wall mass or suspicious bone lesions identified. CT ABDOMEN PELVIS FINDINGS Hepatobiliary: A hypodense lesion of the  lateral liver dome, hepatic segment VII, measures 1.3 x 1.2 cm and is increased in size when compared to prior CT examination of the abdomen and pelvis dated 04/06/2019 and MR examination dated 04/08/2019 (series 504, image 51). This is difficult to directly compare to prior CT examination of the chest dated  05/29/2019 due to contrast phase although appears to be enlarged in size. No gallstones, gallbladder wall thickening, or biliary dilatation. Pancreas: Unremarkable. No pancreatic ductal dilatation or surrounding inflammatory changes. Spleen: Normal in size without significant abnormality. Adrenals/Urinary Tract: Adrenal glands are unremarkable. Kidneys are normal, without renal calculi, solid lesion, or hydronephrosis. Bladder is unremarkable. Stomach/Bowel: Stomach is within normal limits. Status post interval right hemicolectomy and ileocolic anastomosis. No evidence of bowel wall thickening, distention, or inflammatory changes. Large burden of stool throughout the colon. Vascular/Lymphatic: Aortic atherosclerosis. No enlarged abdominal or pelvic lymph nodes. Reproductive: No mass or other abnormality. Other: No abdominal wall hernia or abnormality. No abdominopelvic ascites. Musculoskeletal: No acute or significant osseous findings. IMPRESSION: 1. Minimal residual soft tissue of the AP window at the previous site of an enlarged, PET avid lymph node, slightly decreased in size compared to prior CT examination, consistent with ongoing treatment response. No discretely enlarged mediastinal or hilar lymph nodes. 2. Interval enlargement of a hypodense lesion of the lateral liver dome, now measuring 1.3 x 1.2 cm, concerning for worsened metastatic disease. 3. No evidence of new metastatic disease in the chest, abdomen, or pelvis. 4. New irregular ground-glass opacity of the suprahilar left upper lobe, likely developing radiation change. Attention on follow-up. 5. Status post interval right hemicolectomy and  ileocolic anastomosis. No evidence of local recurrence. 6. Coronary artery disease. Aortic Atherosclerosis (ICD10-I70.0). Electronically Signed   By: Eddie Candle M.D.   On: 08/20/2019 11:59   CT ABDOMEN PELVIS W CONTRAST  Result Date: 08/20/2019 CLINICAL DATA:  Small-cell lung cancer, synchronous colon cancer EXAM: CT CHEST, ABDOMEN, AND PELVIS WITH CONTRAST TECHNIQUE: Multidetector CT imaging of the chest, abdomen and pelvis was performed following the standard protocol during bolus administration of intravenous contrast. CONTRAST:  163m OMNIPAQUE IOHEXOL 300 MG/ML SOLN, additional oral enteric contrast COMPARISON:  CT chest, 05/29/2019, CT abdomen pelvis, 04/06/2019, MR abdomen pelvis, 04/08/2019, PET-CT, 12/25/2018 FINDINGS: CT CHEST FINDINGS Cardiovascular: Right chest port catheter. Aortic atherosclerosis. Normal heart size. Three-vessel coronary artery calcifications. No pericardial effusion. Mediastinum/Nodes: Minimal residual soft tissue of the AP window at the previous site of an enlarged, PET avid lymph node, now measuring approximately 2.0 x 0.6 cm, slightly decreased in size compared to prior (series 504, image 23). No discretely enlarged mediastinal or hilar lymph nodes. Thyroid gland, trachea, and esophagus demonstrate no significant findings. Lungs/Pleura: There is new irregular ground-glass opacity of the suprahilar left upper lobe (series 505, image 49). No pleural effusion or pneumothorax. Musculoskeletal: No chest wall mass or suspicious bone lesions identified. CT ABDOMEN PELVIS FINDINGS Hepatobiliary: A hypodense lesion of the lateral liver dome, hepatic segment VII, measures 1.3 x 1.2 cm and is increased in size when compared to prior CT examination of the abdomen and pelvis dated 04/06/2019 and MR examination dated 04/08/2019 (series 504, image 51). This is difficult to directly compare to prior CT examination of the chest dated 05/29/2019 due to contrast phase although appears to be  enlarged in size. No gallstones, gallbladder wall thickening, or biliary dilatation. Pancreas: Unremarkable. No pancreatic ductal dilatation or surrounding inflammatory changes. Spleen: Normal in size without significant abnormality. Adrenals/Urinary Tract: Adrenal glands are unremarkable. Kidneys are normal, without renal calculi, solid lesion, or hydronephrosis. Bladder is unremarkable. Stomach/Bowel: Stomach is within normal limits. Status post interval right hemicolectomy and ileocolic anastomosis. No evidence of bowel wall thickening, distention, or inflammatory changes. Large burden of stool throughout the colon. Vascular/Lymphatic: Aortic atherosclerosis. No enlarged abdominal or pelvic lymph nodes. Reproductive: No  mass or other abnormality. Other: No abdominal wall hernia or abnormality. No abdominopelvic ascites. Musculoskeletal: No acute or significant osseous findings. IMPRESSION: 1. Minimal residual soft tissue of the AP window at the previous site of an enlarged, PET avid lymph node, slightly decreased in size compared to prior CT examination, consistent with ongoing treatment response. No discretely enlarged mediastinal or hilar lymph nodes. 2. Interval enlargement of a hypodense lesion of the lateral liver dome, now measuring 1.3 x 1.2 cm, concerning for worsened metastatic disease. 3. No evidence of new metastatic disease in the chest, abdomen, or pelvis. 4. New irregular ground-glass opacity of the suprahilar left upper lobe, likely developing radiation change. Attention on follow-up. 5. Status post interval right hemicolectomy and ileocolic anastomosis. No evidence of local recurrence. 6. Coronary artery disease. Aortic Atherosclerosis (ICD10-I70.0). Electronically Signed   By: Eddie Candle M.D.   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 Limited stage small cell cancer-likely lung primary [liver  lesion].  TX N2 M0. s/p [place] etoposide-every 3 weeks with concurrent radiation x4 cycles.  CT scan August 20, 2019-improved/stable mediastinal lymphadenopathy.  No new lung lesions; slightly increasing peripheral dome liver lesion [see below]  #With regards to small cell lung cancer-recommend surveillance at this time.  Hold off any therapies at this time.  #  Liver lesion approximately 1.2-1.3 centimeters currently; from approximately 1 cm on MRI in November.  Slightly worse.  Difficulty biopsy given the location.  I would recommend monitoring closely with a repeat MRI in 2 months.  # Stage III- colon cancer-no adjuvant therapy for above reasons.  No evidence of recurrence.  # Anemia- secondary- sec to chemo- Hb 10.9-STABLE.   # # I discussed regarding Covid-19 precautions.  I reviewed the vaccine effectiveness and potential side effects in detail.  Also discussed long-term effectiveness and safety profile are unclear at this time.  I discussed December, 2020 [employer] position statement-that all patients are recommended COVID-19 vaccinations [when available]-as long as they do not have allergy to components of the vaccine.  However, I think the benefits of the vaccination outweigh the potential risks. Re: [id number] vaccination.  # DISPOSITION: # follow up in end of April 2021 - MD; labs/cbc/cmp/port flush; MRI liver prior-Dr.B.   # I reviewed the blood work- with the patient in detail; also reviewed the imaging independently [as summarized above]; and with the patient in detail.      All questions were answered. The patient knows to call the clinic with any problems, questions or concerns.    Cammie Sickle, MD 08/21/2019 11:18 AM

## 2019-08-21 NOTE — Progress Notes (Signed)
Radiation Oncology Follow up Note  Name: Kevin Mckay   Date:   08/21/2019 MRN:  [id number] DOB: 01-Apr-1951    This 69 y.o. male presents to the clinic today for 64-month follow-up status post concurrent chemoradiation therapy for limited stage small cell lung cancer stage T2 N0 M0.  REFERRING PROVIDER: Laneta Simmers, NP  HPI: Patient is a 69 year old male now at 4 months having completed concurrent chemoradiation therapy for limited stage small cell lung cancer.  He is seen today in routine follow-up and is doing well specifically [place] cough hemoptysis or chest tightness.Marland Kitchen  His most recent CT scan yesterday showed minimal residual soft tissue mass in the AP window and site of enlarged lymph node.  He has had interval enlargement of a hypodense lesion of the lateral liver dome now measuring 1.3 cm in greatest dimension worrisome for metastatic disease.  No evidence of metastatic disease in chest abdomen or pelvis.  He is currently under surveillance by medical oncology.  MRI of his liver has been ordered in 2 months.  COMPLICATIONS OF TREATMENT: none  FOLLOW UP COMPLIANCE: keeps appointments   PHYSICAL EXAM:  There were no vitals taken for this visit. Well-developed well-nourished patient in NAD. HEENT reveals PERLA, EOMI, discs not visualized.  Oral cavity is clear. No oral mucosal lesions are identified. Neck is clear without evidence of cervical or supraclavicular adenopathy. Lungs are clear to A&P. Cardiac examination is essentially unremarkable with regular rate and rhythm without murmur rub or thrill. Abdomen is benign with no organomegaly or masses noted. Motor sensory and DTR levels are equal and symmetric in the upper and lower extremities. Cranial nerves II through XII are grossly intact. Proprioception is intact. No peripheral adenopathy or edema is identified. No motor or sensory levels are noted. Crude visual fields are within normal range.  RADIOLOGY RESULTS: CT scans of  chest abdomen pelvis all reviewed compatible with above-stated findings  PLAN: Present time patient is stable.  Concerning about the lesion in his liver although that is being monitored by medical oncology with repeat MRI scan in April.  At this time based on possibility of stage IV disease would not offer PCI.  I have asked to see him back in 6 months for follow-up.  Be happy to reevaluate the patient anytime should further palliative treatment be indicated.  I would like to take this opportunity to thank you for allowing me to participate in the care of your patient.Noreene Filbert, MD

## 2019-08-21 NOTE — Patient Instructions (Signed)
For more information/scheduling recommend call [phone], 8:30am-4:30pm.

## 2019-10-20 ENCOUNTER — Telehealth: Payer: Self-pay | Admitting: *Deleted

## 2019-10-20 NOTE — Telephone Encounter (Signed)
Approximately 2 days ago, patient has sustained a fall  after getting out of his bed. Patient reports shortness of breath and pain on right side of chest wall under breast. "I believe I broke a rib." pt would like to know if Dr. B needs to see him vs urgent care. He has an MRI liver planned for tomorrow.  I spoke with Dr. Rogue Bussing. patient advised per md to go to urgent care for further evaluation of rib pain.

## 2019-10-21 ENCOUNTER — Ambulatory Visit
Admission: RE | Admit: 2019-10-21 | Discharge: 2019-10-21 | Disposition: A | Discharge status: Home / Self Care | Payer: BC Managed Care – PPO | Source: Ambulatory Visit | Attending: Internal Medicine | Admitting: Internal Medicine

## 2019-10-21 ENCOUNTER — Other Ambulatory Visit: Payer: Self-pay

## 2019-10-21 DIAGNOSIS — C781 Secondary malignant neoplasm of mediastinum: Secondary | ICD-10-CM | POA: Diagnosis not present

## 2019-10-21 DIAGNOSIS — C801 Malignant (primary) neoplasm, unspecified: Secondary | ICD-10-CM | POA: Diagnosis present

## 2019-10-21 DIAGNOSIS — K769 Liver disease, unspecified: Secondary | ICD-10-CM | POA: Insufficient documentation

## 2019-10-21 MED ORDER — GADOBUTROL 1 MMOL/ML IV SOLN
6.0000 mL | Freq: Once | INTRAVENOUS | Status: AC | PRN
  Administered 2019-10-21: 6 mL via INTRAVENOUS

## 2019-10-23 ENCOUNTER — Encounter: Payer: Self-pay | Admitting: Internal Medicine

## 2019-10-23 ENCOUNTER — Other Ambulatory Visit: Payer: Self-pay

## 2019-10-23 ENCOUNTER — Inpatient Hospital Stay: Payer: BC Managed Care – PPO | Attending: Internal Medicine

## 2019-10-23 ENCOUNTER — Inpatient Hospital Stay (HOSPITAL_BASED_OUTPATIENT_CLINIC_OR_DEPARTMENT_OTHER): Payer: BC Managed Care – PPO | Admitting: Internal Medicine

## 2019-10-23 VITALS — BP 154/63 | HR 71 | Temp 98.6°F | Wt 147.1 lb

## 2019-10-23 DIAGNOSIS — Z452 Encounter for adjustment and management of vascular access device: Secondary | ICD-10-CM | POA: Diagnosis present

## 2019-10-23 DIAGNOSIS — R5383 Other fatigue: Secondary | ICD-10-CM | POA: Insufficient documentation

## 2019-10-23 DIAGNOSIS — Z9221 Personal history of antineoplastic chemotherapy: Secondary | ICD-10-CM | POA: Insufficient documentation

## 2019-10-23 DIAGNOSIS — R0602 Shortness of breath: Secondary | ICD-10-CM | POA: Insufficient documentation

## 2019-10-23 DIAGNOSIS — K769 Liver disease, unspecified: Secondary | ICD-10-CM

## 2019-10-23 DIAGNOSIS — C781 Secondary malignant neoplasm of mediastinum: Secondary | ICD-10-CM

## 2019-10-23 DIAGNOSIS — F17210 Nicotine dependence, cigarettes, uncomplicated: Secondary | ICD-10-CM | POA: Insufficient documentation

## 2019-10-23 DIAGNOSIS — D376 Neoplasm of uncertain behavior of liver, gallbladder and bile ducts: Secondary | ICD-10-CM | POA: Insufficient documentation

## 2019-10-23 DIAGNOSIS — Z79899 Other long term (current) drug therapy: Secondary | ICD-10-CM | POA: Insufficient documentation

## 2019-10-23 DIAGNOSIS — C801 Malignant (primary) neoplasm, unspecified: Secondary | ICD-10-CM | POA: Insufficient documentation

## 2019-10-23 DIAGNOSIS — Z923 Personal history of irradiation: Secondary | ICD-10-CM | POA: Insufficient documentation

## 2019-10-23 DIAGNOSIS — E1142 Type 2 diabetes mellitus with diabetic polyneuropathy: Secondary | ICD-10-CM | POA: Diagnosis not present

## 2019-10-23 DIAGNOSIS — F329 Major depressive disorder, single episode, unspecified: Secondary | ICD-10-CM | POA: Insufficient documentation

## 2019-10-23 DIAGNOSIS — J449 Chronic obstructive pulmonary disease, unspecified: Secondary | ICD-10-CM | POA: Diagnosis not present

## 2019-10-23 LAB — COMPREHENSIVE METABOLIC PANEL
ALT: 13 U/L (ref 0–44)
AST: 13 U/L — ABNORMAL LOW (ref 15–41)
Albumin: 3.5 g/dL (ref 3.5–5.0)
Alkaline Phosphatase: 78 U/L (ref 38–126)
Anion gap: 10 (ref 5–15)
BUN: 22 mg/dL (ref 8–23)
CO2: 27 mmol/L (ref 22–32)
Calcium: 8.6 mg/dL — ABNORMAL LOW (ref 8.9–10.3)
Chloride: 93 mmol/L — ABNORMAL LOW (ref 98–111)
Creatinine, Ser: 1.15 mg/dL (ref 0.61–1.24)
GFR calc Af Amer: >60 mL/min (ref >60)
GFR calc non Af Amer: >60 mL/min (ref >60)
Glucose, Bld: 444 mg/dL — ABNORMAL HIGH (ref 70–99)
Potassium: 4 mmol/L (ref 3.5–5.1)
Sodium: 130 mmol/L — ABNORMAL LOW (ref 135–145)
Total Bilirubin: 0.3 mg/dL (ref 0.3–1.2)
Total Protein: 6.5 g/dL (ref 6.5–8.1)

## 2019-10-23 LAB — CBC WITH DIFFERENTIAL/PLATELET
Abs Immature Granulocytes: 0.01 10*3/uL (ref 0.00–0.07)
Basophils Absolute: 0 10*3/uL (ref 0.0–0.1)
Basophils Relative: 1 %
Eosinophils Absolute: 0.3 10*3/uL (ref 0.0–0.5)
Eosinophils Relative: 6 %
HCT: 33.5 % — ABNORMAL LOW (ref 39.0–52.0)
Hemoglobin: 11.2 g/dL — ABNORMAL LOW (ref 13.0–17.0)
Immature Granulocytes: 0 %
Lymphocytes Relative: 21 %
Lymphs Abs: 1.2 10*3/uL (ref 0.7–4.0)
MCH: 27.4 pg (ref 26.0–34.0)
MCHC: 33.4 g/dL (ref 30.0–36.0)
MCV: 81.9 fL (ref 80.0–100.0)
Monocytes Absolute: 0.5 10*3/uL (ref 0.1–1.0)
Monocytes Relative: 8 %
Neutro Abs: 3.8 10*3/uL (ref 1.7–7.7)
Neutrophils Relative %: 64 %
Platelets: 157 10*3/uL (ref 150–400)
RBC: 4.09 MIL/uL — ABNORMAL LOW (ref 4.22–5.81)
RDW: 13.1 % (ref 11.5–15.5)
WBC: 5.8 10*3/uL (ref 4.0–10.5)
nRBC: 0 % (ref 0.0–0.2)

## 2019-10-23 MED ORDER — SODIUM CHLORIDE 0.9% FLUSH
10.0000 mL | Freq: Once | INTRAVENOUS | Status: AC
  Administered 2019-10-23: 10 mL via INTRAVENOUS
  Filled 2019-10-23: qty 10

## 2019-10-23 MED ORDER — HEPARIN SOD (PORK) LOCK FLUSH 100 UNIT/ML IV SOLN
INTRAVENOUS | Status: AC
  Filled 2019-10-23: qty 5

## 2019-10-23 MED ORDER — HEPARIN SOD (PORK) LOCK FLUSH 100 UNIT/ML IV SOLN
500.0000 [IU] | Freq: Once | INTRAVENOUS | Status: AC
  Administered 2019-10-23: 500 [IU] via INTRAVENOUS
  Filled 2019-10-23: qty 5

## 2019-10-23 NOTE — Progress Notes (Signed)
[facility] NOTE  Patient Care Team: Laneta Simmers, NP as PCP - General (Nurse Practitioner) Telford Nab, RN as Registered Nurse Clent Jacks, RN as Oncology Nurse Navigator  CHIEF COMPLAINTS/PURPOSE OF CONSULTATION: Lung cancer/colon cancer   Oncology History Overview Note  # July 2020- SMALL CELL CA METASTATIC TO MEDIASTINAL LN [open Bx; Dr.Oaks]; TxN2M0; July 2nd 2020-PET- 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  MRI brain negative  # aug 17th 2020-carbo etoposide-RT [? 8/19]; #4 cycle carbo-Etop [finished dec 30th,2020]  #August 2020 iron deficient anemia-question etiology; IV Feraheme [no colonoscopy]  # OCT 2020- colo/ Cecal adeno ca; MRI liver- 1 cm enhancing lesion "metastasis" [Dr.Anna/Dr.Cannon]-difficulty biopsy.  November 02-2019 right hemicolectomy- STAGE III [pT3pN1 (2/13LN)]- HOLD adjuvant therapy. MMR- INTACT/LOW  # COPD/  DM-2- on OHA/ smoker/ PVD/peripheral neuropathy  # History of alcohol abuse/quit 2014/ Hx of abdominal trauma [at 21y]  # DIAGNOSIS: SMALL CELL CA [? Lung primary]  STAGE:  limited       ;GOALS: ? 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 [facility])   02/09/2019 -  Chemotherapy   The patient had palonosetron (ALOXI) injection 0.25 mg, 0.25 mg, Intravenous,  Once, 4 of 4 cycles Administration: 0.25 mg (02/09/2019), 0.25 mg (03/03/2019), 0.25 mg (06/02/2019), 0.25 mg (06/22/2019) CARBOplatin (PARAPLATIN) 410 mg in sodium chloride 0.9 % 250 mL chemo infusion, 410 mg (100 % of original dose 414 mg), Intravenous,  Once, 4 of 4 cycles Dose modification:   (original dose 414 mg, Cycle 1) Administration: 410 mg (02/09/2019), 410 mg (03/03/2019), 410 mg (06/02/2019), 410 mg (06/22/2019) etoposide (VEPESID) 180  mg in sodium chloride 0.9 % 500 mL chemo infusion, 100 mg/m2 = 180 mg, Intravenous,  Once, 4 of 4 cycles Administration: 180 mg (02/09/2019), 180 mg (02/10/2019), 180 mg (02/11/2019), 180 mg (03/03/2019), 180 mg (03/04/2019), 180 mg (03/05/2019), 180 mg (06/02/2019), 180 mg (06/03/2019), 180 mg (06/04/2019), 180 mg (06/22/2019), 180 mg (06/23/2019), 180 mg (06/24/2019) fosaprepitant (EMEND) 150 mg, dexamethasone (DECADRON) 6 mg in sodium chloride 0.9 % 145 mL IVPB, , Intravenous,  Once, 2 of 2 cycles Administration:  (06/02/2019),  (06/22/2019)  for chemotherapy treatment.     HISTORY OF PRESENTING ILLNESS:   Kevin Mckay 69 y.o.  male limited stage small cell-likely lung primary s/p concurrent chemoradiation carboplatin etoposide; also interim diagnosis of colon cancer-stage III is here for follow-up review results of the liver MRI.  Patient states that he had a mechanical fall.  And he hurt his right ribs.  Denies any headaches.  Denies any nausea vomiting.  Chronic mild shortness of breath chronic cough.  Review of Systems  Constitutional: Positive for malaise/fatigue. Negative for chills, diaphoresis, fever and weight loss.  HENT: Negative for nosebleeds and sore throat.   Eyes: Negative for double vision.  Respiratory: Positive for cough, sputum production and shortness of breath. Negative for hemoptysis and wheezing.   Cardiovascular: Negative for chest pain, palpitations, orthopnea and leg swelling.  Gastrointestinal: Negative for abdominal pain, blood in stool, constipation, diarrhea, heartburn, melena, nausea and vomiting.  Genitourinary: Negative for dysuria, frequency and urgency.  Musculoskeletal: Negative for back pain and joint pain.  Skin: Negative.  Negative for itching and rash.  Neurological: Negative for dizziness, focal weakness and weakness.  Endo/Heme/Allergies: Does not bruise/bleed easily.  Psychiatric/Behavioral: Negative for depression. The  patient is not nervous/anxious  and does not have insomnia.      MEDICAL HISTORY:  Past Medical History:  Diagnosis Date  . Asthma   . Cancer ([place])    Metastatic small cell lung cancer  . Cerebral aneurysm   . Chronic painful diabetic neuropathy ([place])   . Depression   . Diabetes mellitus without complication ([place])   . Essential hypertension   . History of kidney stones   . Occasional tremors   . Tobacco use     SURGICAL HISTORY: Past Surgical History:  Procedure Laterality Date  . ABDOMINAL SURGERY     age 71. trauma surgery due to forklift injury- liver and spleen  . COLONOSCOPY WITH PROPOFOL N/A 03/27/2019   Procedure: COLONOSCOPY WITH PROPOFOL;  Surgeon: Jonathon Bellows, MD;  Location[facility] ENDOSCOPY;  Service: Gastroenterology;  Laterality: N/A;  . COLOSTOMY REVISION Right 05/04/2019   Procedure: COLON RESECTION RIGHT-right hemicolectomy-open;  Surgeon: Fredirick Maudlin, MD;  Location: [facility] ORS;  Service: General;  Laterality: Right;  . ESOPHAGOGASTRODUODENOSCOPY (EGD) WITH PROPOFOL N/A 03/27/2019   Procedure: ESOPHAGOGASTRODUODENOSCOPY (EGD) WITH PROPOFOL;  Surgeon: Jonathon Bellows, MD;  Location: [facility] ENDOSCOPY;  Service: Gastroenterology;  Laterality: N/A;  . IR GENERIC HISTORICAL  05/31/2016   IR ANGIO VERTEBRAL SEL VERTEBRAL BILAT MOD SED 05/31/2016 Consuella Lose, MD [facility]-INTERV RAD  . IR GENERIC HISTORICAL  05/31/2016   IR ANGIO INTRA EXTRACRAN SEL INTERNAL CAROTID BILAT MOD SED 05/31/2016 Consuella Lose, MD [facility]-INTER[facility] RAD  . PORTACATH PLACEMENT Right 02/04/2019   Procedure: INSERTION PORT-A-CATH;  Surgeon: [name], MD;  Location: [facility] ORS;  Service: General;  Laterality: Right;  . THORACOTOMY Left 01/22/2019   Procedure: PRE OP BRONCH LEFT ANTERIOR THORACOTOMY WITH BIOPSY OF MEDIASTINAL MASS;  Surgeon: [name], MD;  Location: [facility] ORS;  Service: General;  Laterality: Left;    SOCIAL HISTORY: Social History   Socioeconomic History  . Marital status: Married    Spouse name: Not on file  .  Number of children: Not on file  . Years of education: Not on file  . Highest education level: Not on file  Occupational History  . Not on file  Tobacco Use  . Smoking status: Current Every Day Smoker    Packs/day: 0.50    Years: 50.00    Pack years: 25.00    Types: Cigarettes  . Smokeless tobacco: Never Used  Substance and Sexual Activity  . Alcohol use: No  . Drug use: Yes    Types: Barbituates, Marijuana  . Sexual activity: Not on file  Other Topics Concern  . Not on file  Social History Narrative   Lives in [place]; wife/ son/grandson [custody]; smoker; vending business; hx of alcoholism- quit at 85.    Social Determinants of Health   Financial Resource Strain: Low Risk   . Difficulty of Paying Living Expenses: Not hard at all  Food Insecurity: No Food Insecurity  . Worried About Charity fundraiser in the Last Year: Never true  . Ran Out of Food in the Last Year: Never true  Transportation Needs: No Transportation Needs  . Lack of Transportation (Medical): No  . Lack of Transportation (Non-Medical): No  Physical Activity: Inactive  . Days of Exercise per Week: 0 days  . Minutes of Exercise per Session: 0 min  Stress: No Stress Concern Present  . Feeling of Stress : Not at all  Social Connections: Unknown  . Frequency of Communication with Friends and Family: Patient refused  . Frequency of Social  Gatherings with Friends and Family: Patient refused  . Attends Religious Services: Patient refused  . Active Member of Clubs or Organizations: Patient refused  . Attends Archivist Meetings: Patient refused  . Marital Status: Patient refused  Intimate Partner Violence: Not At Risk  . Fear of Current or Ex-Partner: No  . Emotionally Abused: No  . Physically Abused: No  . Sexually Abused: No    FAMILY HISTORY: Family History  Problem Relation Age of Onset  . Lymphoma Father   . Liver cancer Paternal Uncle   . Lung cancer Paternal Uncle   . Lung cancer  Maternal Uncle     ALLERGIES:  is allergic to ferumoxytol; amoxicillin; and codeine.  MEDICATIONS:  Current Outpatient Medications  Medication Sig Dispense Refill  . amLODipine (NORVASC) 10 MG tablet Take 10 mg by mouth every morning.     . budesonide-formoterol (SYMBICORT) 160-4.5 MCG/ACT inhaler Inhale 2 puffs into the lungs 2 (two) times daily.    . DULoxetine (CYMBALTA) 30 MG capsule Take 30 mg by mouth every morning.     . ferrous sulfate 324 (65 Fe) MG TBEC Take 324 mg by mouth daily.     Marland Kitchen gabapentin (NEURONTIN) 300 MG capsule Take 600 mg by mouth 3 (three) times daily.     . hydrochlorothiazide (HYDRODIURIL) 25 MG tablet Take 25 mg by mouth daily.     . insulin glargine (LANTUS) 100 UNIT/ML injection Inject into the skin daily. Units based By sliding scale    . Ipratropium-Albuterol (COMBIVENT) 20-100 MCG/ACT AERS respimat Inhale into the lungs.    . lidocaine-prilocaine (EMLA) cream Apply 1 application topically as needed. 30-45 mins prior to port access. 30 g 0  . linaclotide (LINZESS) 145 MCG CAPS capsule Take 145 mcg by mouth daily before breakfast.    . lovastatin (MEVACOR) 20 MG tablet Take 20 mg by mouth every evening.    . metFORMIN (GLUCOPHAGE) 1000 MG tablet Take 1,000 mg by mouth 2 (two) times daily with a meal.    . Multiple Vitamins-Minerals (MULTIVITAMIN WITH MINERALS) tablet Take 1 tablet by mouth daily.    Marland Kitchen omeprazole (PRILOSEC) 40 MG capsule Take 1 capsule (40 mg total) by mouth daily. (Patient taking differently: Take 40 mg by mouth every morning. ) 90 capsule 1  . prazosin (MINIPRESS) 1 MG capsule Take 1 mg by mouth at bedtime.    . psyllium (METAMUCIL) 58.6 % powder Take 1 packet by mouth daily.    . sitaGLIPtin (JANUVIA) 25 MG tablet Take 25 mg by mouth daily.    . vitamin B-12 (CYANOCOBALAMIN) 100 MCG tablet Take 100 mcg by mouth daily.    . ondansetron (ZOFRAN) 8 MG tablet Take 1 tablet (8 mg total) by mouth every 8 (eight) hours as needed for nausea or  vomiting (start 3 days; after chemo). (Patient not taking: Reported on 06/18/2019) 40 tablet 1  . polyethylene glycol powder (MIRALAX) 17 GM/SCOOP powder Mix full container in 64 ounces of Gatorade or other clear liquid. NO RED Liquids (Patient not taking: Reported on 10/22/2019) 238 g 0   No current facility-administered medications for this visit.      Marland Kitchen  PHYSICAL EXAMINATION: ECOG PERFORMANCE STATUS: 0 - Asymptomatic  Vitals:   10/23/19 1046  BP: (!) 154/63  Pulse: 71  Temp: 98.6 F (37 C)   Filed Weights   10/23/19 1046  Weight: 147 lb 2 oz (66.7 kg)    Physical Exam  Constitutional: He is oriented to person, place,  and time and well-developed, well-nourished, and in no distress.  HENT:  Head: Normocephalic and atraumatic.  Mouth/Throat: Oropharynx is clear and moist. No oropharyngeal exudate.  Eyes: Pupils are equal, round, and reactive to light.  Cardiovascular: Normal rate and regular rhythm.  Pulmonary/Chest: No respiratory distress. He has no wheezes.  Decreased air entry bilaterally.  Abdominal: Soft. Bowel sounds are normal. He exhibits no distension and no mass. There is no abdominal tenderness. There is no rebound and no guarding.  Abdominal incision well-healed.  Musculoskeletal:        General: No tenderness or edema. Normal range of motion.     Cervical back: Normal range of motion and neck supple.  Neurological: He is alert and oriented to person, place, and time.  Skin: Skin is warm.  Psychiatric: Affect normal.    LABORATORY DATA:  I have reviewed the data as listed Lab Results  Component Value Date   WBC 5.8 10/23/2019   HGB 11.2 (L) 10/23/2019   HCT 33.5 (L) 10/23/2019   MCV 81.9 10/23/2019   PLT 157 10/23/2019   Recent Labs    06/22/19 0825 08/20/19 0806 10/23/19 1022  NA 133* 139 130*  K 3.7 3.9 4.0  CL 97* 99 93*  CO2 '23 31 27  ' GLUCOSE 287* 175* 444*  BUN 17 26* 22  CREATININE 1.04 1.04 1.15  CALCIUM 9.3 9.2 8.6*  GFRNONAA >60  >60 >60  GFRAA >60 >60 >60  PROT 7.1 7.1 6.5  ALBUMIN 3.9 4.1 3.5  AST 16 15 13*  ALT '13 12 13  ' ALKPHOS 70 52 78  BILITOT 0.4 0.5 0.3    RADIOGRAPHIC STUDIES: I have personally reviewed the radiological images as listed and agreed with the findings in the report. MR LIVER W WO CONTRAST  Result Date: 10/21/2019 CLINICAL DATA:  History of colon cancer and lung cancer. Follow-up liver lesion. EXAM: MRI ABDOMEN WITHOUT AND WITH CONTRAST TECHNIQUE: Multiplanar multisequence MR imaging of the abdomen was performed both before and after the administration of intravenous contrast. CONTRAST:  56m GADAVIST GADOBUTROL 1 MMOL/ML IV SOLN COMPARISON:  MRI 04/08/2019. FINDINGS: Lower chest: No acute findings. Hepatobiliary: Focal area of restricted diffusion is identified within segment 7 corresponding to the previously noted suspected liver metastases. This measures 2.7 x 2.7 cm, image 22/14. On 04/08/2019 this measured 1.2 x 1.7 cm. On 08/20/2019 this measured 1.3 x 1.2 cm. There is a new focus of arterial phase enhancement localizing to the medial margin of segment 6 measuring 1 cm, image 59/13. Collapsed gallbladder. No biliary dilatation. Pancreas: Head of pancreas appears normal. Body and tail of pancreas are not well visualized and may be atrophic or surgically absent. Spleen:  Within normal limits in size and appearance. Adrenals/Urinary Tract: Normal appearance of the adrenal glands. No kidney mass or hydronephrosis Stomach/Bowel: Visualized portions within the abdomen are unremarkable. Vascular/Lymphatic: Aortic atherosclerosis. No aneurysm. No abdominal adenopathy. 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 Other:  No ascites Musculoskeletal: No suspicious bone lesions identified. IMPRESSION: 1. Interval increase in size of liver metastasis within segment 7 compared with 08/20/2019 and 04/08/2019. 2. There is a new, subtle focus of arterial phase enhancement localizing to the medial margin of segment 6  measuring 1 cm. New focus of metastasis cannot be excluded. Attention in this area on follow-up imaging is advised. 3.  Aortic Atherosclerosis (ICD10-I70.0). Electronically Signed   By: TKerby MoorsM.D.   On: 10/21/2019 12:20    ASSESSMENT & PLAN:   Metastatic small  cell carcinoma involving mediastinum with unknown primary site [facility]) # ? Limited stage small cell cancer-likely lung primary.  TX N2 M0. s/p carbo etoposide with concurrent radiation x4 cycles.  CT scan August 20, 2019-improved/stable mediastinal lymphadenopathy.  No new lung lesions; Clinically STABLE; see below regarding increasing size of the liver lesion  # MRI Liver April 26th2021- Increasing size of liver lesion approximately ~ 2.5 cm [prior 1.2]; approximately 1 cm new lesion. -Highly concerning for metastatic malignancy.  Will discuss with radiology regarding any possibility of biopsy.  Previously, previously difficulty biopsy.  # Stage III- colon cancer-no adjuvant therapy because of likely metastatic disease see above.   # Anemia- secondary- sec to chemo- Hb 10.9-stable  # #Poorly controlled diabetes blood sugars 444 today; recommend compliance with his diet medications.  Close monitoring of blood sugars.  Follow-up with PCP.  # I reviewed the blood work- with the patient in detail; also reviewed the imaging independently [as summarized above]; and with the patient in detail.    # DISPOSITION: # follow up TBD- Dr.B  Addendum: Discussed with radiology Dr. Wonda Olds amenable for ultrasound-guided biopsy.  Biopsy scheduled.  Discussed with the patient-in agreement.  Recommend follow-up 3 to 4 days post biopsy.     All questions were answered. The patient knows to call the clinic with any problems, questions or concerns.    Cammie Sickle, MD 10/25/2019 2:23 PM

## 2019-10-23 NOTE — Assessment & Plan Note (Addendum)
# ?   Limited stage small cell cancer-likely lung primary.  TX N2 M0. s/p carbo etoposide with concurrent radiation x4 cycles.  CT scan August 20, 2019-improved/stable mediastinal lymphadenopathy.  No new lung lesions; Clinically STABLE; see below regarding increasing size of the liver lesion  # MRI Liver April 26th2021- Increasing size of liver lesion approximately ~ 2.5 cm [prior 1.2]; approximately 1 cm new lesion. -Highly concerning for metastatic malignancy.  Will discuss with radiology regarding any possibility of biopsy.  Previously, previously difficulty biopsy.  # Stage III- colon cancer-no adjuvant therapy because of likely metastatic disease see above.   # Anemia- secondary- sec to chemo- Hb 10.9-stable  # #Poorly controlled diabetes blood sugars 444 today; recommend compliance with his diet medications.  Close monitoring of blood sugars.  Follow-up with PCP.  # I reviewed the blood work- with the patient in detail; also reviewed the imaging independently [as summarized above]; and with the patient in detail.    # DISPOSITION: # follow up TBD- Dr.B  Addendum: Discussed with radiology Dr. Wonda Olds amenable for ultrasound-guided biopsy.  Biopsy scheduled.  Discussed with the patient-in agreement.  Recommend follow-up 3 to 4 days post biopsy.

## 2019-10-26 ENCOUNTER — Telehealth: Payer: Self-pay | Admitting: *Deleted

## 2019-10-26 DIAGNOSIS — C781 Secondary malignant neoplasm of mediastinum: Secondary | ICD-10-CM

## 2019-10-26 DIAGNOSIS — C801 Malignant (primary) neoplasm, unspecified: Secondary | ICD-10-CM

## 2019-10-26 DIAGNOSIS — K769 Liver disease, unspecified: Secondary | ICD-10-CM

## 2019-10-26 NOTE — Telephone Encounter (Signed)
Spoke with patient. Patient given apt for coag labs - cbc/pt/ptnr on 10/29/2019.   Liver Biopsy date 11/02/19 and arrival time and instructions reviewed with the patient.   Follow-up apt given to the patient for 5/13

## 2019-10-27 ENCOUNTER — Other Ambulatory Visit: Payer: Self-pay | Admitting: Internal Medicine

## 2019-10-29 ENCOUNTER — Other Ambulatory Visit: Payer: Self-pay

## 2019-10-29 ENCOUNTER — Other Ambulatory Visit: Payer: Self-pay | Admitting: Radiology

## 2019-10-29 ENCOUNTER — Inpatient Hospital Stay: Payer: BC Managed Care – PPO | Attending: Internal Medicine

## 2019-10-29 ENCOUNTER — Inpatient Hospital Stay: Payer: BC Managed Care – PPO

## 2019-10-29 DIAGNOSIS — C3490 Malignant neoplasm of unspecified part of unspecified bronchus or lung: Secondary | ICD-10-CM | POA: Insufficient documentation

## 2019-10-29 DIAGNOSIS — I739 Peripheral vascular disease, unspecified: Secondary | ICD-10-CM | POA: Diagnosis not present

## 2019-10-29 DIAGNOSIS — F17210 Nicotine dependence, cigarettes, uncomplicated: Secondary | ICD-10-CM | POA: Diagnosis not present

## 2019-10-29 DIAGNOSIS — J449 Chronic obstructive pulmonary disease, unspecified: Secondary | ICD-10-CM | POA: Diagnosis not present

## 2019-10-29 DIAGNOSIS — C182 Malignant neoplasm of ascending colon: Secondary | ICD-10-CM | POA: Insufficient documentation

## 2019-10-29 DIAGNOSIS — C771 Secondary and unspecified malignant neoplasm of intrathoracic lymph nodes: Secondary | ICD-10-CM | POA: Insufficient documentation

## 2019-10-29 DIAGNOSIS — D649 Anemia, unspecified: Secondary | ICD-10-CM | POA: Diagnosis not present

## 2019-10-29 DIAGNOSIS — D509 Iron deficiency anemia, unspecified: Secondary | ICD-10-CM | POA: Diagnosis not present

## 2019-10-29 DIAGNOSIS — T85618A Breakdown (mechanical) of other specified internal prosthetic devices, implants and grafts, initial encounter: Secondary | ICD-10-CM | POA: Insufficient documentation

## 2019-10-29 DIAGNOSIS — C801 Malignant (primary) neoplasm, unspecified: Secondary | ICD-10-CM

## 2019-10-29 DIAGNOSIS — E1165 Type 2 diabetes mellitus with hyperglycemia: Secondary | ICD-10-CM | POA: Diagnosis not present

## 2019-10-29 DIAGNOSIS — E119 Type 2 diabetes mellitus without complications: Secondary | ICD-10-CM | POA: Diagnosis not present

## 2019-10-29 DIAGNOSIS — C787 Secondary malignant neoplasm of liver and intrahepatic bile duct: Secondary | ICD-10-CM | POA: Insufficient documentation

## 2019-10-29 DIAGNOSIS — C781 Secondary malignant neoplasm of mediastinum: Secondary | ICD-10-CM

## 2019-10-29 DIAGNOSIS — K769 Liver disease, unspecified: Secondary | ICD-10-CM

## 2019-10-29 LAB — CBC WITH DIFFERENTIAL/PLATELET
Abs Immature Granulocytes: 0.01 10*3/uL (ref 0.00–0.07)
Basophils Absolute: 0 10*3/uL (ref 0.0–0.1)
Basophils Relative: 1 %
Eosinophils Absolute: 0.3 10*3/uL (ref 0.0–0.5)
Eosinophils Relative: 5 %
HCT: 38.1 % — ABNORMAL LOW (ref 39.0–52.0)
Hemoglobin: 12.8 g/dL — ABNORMAL LOW (ref 13.0–17.0)
Immature Granulocytes: 0 %
Lymphocytes Relative: 21 %
Lymphs Abs: 1.3 10*3/uL (ref 0.7–4.0)
MCH: 27.6 pg (ref 26.0–34.0)
MCHC: 33.6 g/dL (ref 30.0–36.0)
MCV: 82.1 fL (ref 80.0–100.0)
Monocytes Absolute: 0.5 10*3/uL (ref 0.1–1.0)
Monocytes Relative: 8 %
Neutro Abs: 4 10*3/uL (ref 1.7–7.7)
Neutrophils Relative %: 65 %
Platelets: 160 10*3/uL (ref 150–400)
RBC: 4.64 MIL/uL (ref 4.22–5.81)
RDW: 13.2 % (ref 11.5–15.5)
WBC: 6.1 10*3/uL (ref 4.0–10.5)
nRBC: 0 % (ref 0.0–0.2)

## 2019-10-29 LAB — PROTIME-INR
INR: 0.9 (ref 0.8–1.2)
Prothrombin Time: 12 seconds (ref 11.4–15.2)

## 2019-10-29 LAB — APTT: aPTT: 31 seconds (ref 24–36)

## 2019-10-29 NOTE — Progress Notes (Signed)
Patient on schedule for Liver biopsy 11/02/2019, spoke with patient with instructions given to be here @ 0930,NPO after 0200,take BP meds am of procedure and hold am insulin./metformin,1/2 dose night prior and driver for home post procedure/discharge. Stated understanding.

## 2019-11-02 ENCOUNTER — Ambulatory Visit
Admission: RE | Admit: 2019-11-02 | Discharge: 2019-11-02 | Disposition: A | Discharge status: Home / Self Care | Payer: BC Managed Care – PPO | Source: Ambulatory Visit | Attending: Internal Medicine | Admitting: Internal Medicine

## 2019-11-02 ENCOUNTER — Other Ambulatory Visit: Payer: Self-pay

## 2019-11-02 DIAGNOSIS — Z885 Allergy status to narcotic agent status: Secondary | ICD-10-CM | POA: Insufficient documentation

## 2019-11-02 DIAGNOSIS — Z85118 Personal history of other malignant neoplasm of bronchus and lung: Secondary | ICD-10-CM | POA: Diagnosis not present

## 2019-11-02 DIAGNOSIS — J45909 Unspecified asthma, uncomplicated: Secondary | ICD-10-CM | POA: Insufficient documentation

## 2019-11-02 DIAGNOSIS — Z923 Personal history of irradiation: Secondary | ICD-10-CM | POA: Diagnosis not present

## 2019-11-02 DIAGNOSIS — C787 Secondary malignant neoplasm of liver and intrahepatic bile duct: Secondary | ICD-10-CM | POA: Diagnosis not present

## 2019-11-02 DIAGNOSIS — Z794 Long term (current) use of insulin: Secondary | ICD-10-CM | POA: Insufficient documentation

## 2019-11-02 DIAGNOSIS — I10 Essential (primary) hypertension: Secondary | ICD-10-CM | POA: Insufficient documentation

## 2019-11-02 DIAGNOSIS — Z880 Allergy status to penicillin: Secondary | ICD-10-CM | POA: Diagnosis not present

## 2019-11-02 DIAGNOSIS — Z87442 Personal history of urinary calculi: Secondary | ICD-10-CM | POA: Diagnosis not present

## 2019-11-02 DIAGNOSIS — K769 Liver disease, unspecified: Secondary | ICD-10-CM | POA: Diagnosis present

## 2019-11-02 DIAGNOSIS — Z800 Family history of malignant neoplasm of digestive organs: Secondary | ICD-10-CM | POA: Diagnosis not present

## 2019-11-02 DIAGNOSIS — Z801 Family history of malignant neoplasm of trachea, bronchus and lung: Secondary | ICD-10-CM | POA: Diagnosis not present

## 2019-11-02 DIAGNOSIS — F17210 Nicotine dependence, cigarettes, uncomplicated: Secondary | ICD-10-CM | POA: Diagnosis not present

## 2019-11-02 DIAGNOSIS — Z7951 Long term (current) use of inhaled steroids: Secondary | ICD-10-CM | POA: Diagnosis not present

## 2019-11-02 DIAGNOSIS — Z9221 Personal history of antineoplastic chemotherapy: Secondary | ICD-10-CM | POA: Insufficient documentation

## 2019-11-02 DIAGNOSIS — Z85038 Personal history of other malignant neoplasm of large intestine: Secondary | ICD-10-CM | POA: Diagnosis not present

## 2019-11-02 DIAGNOSIS — Z888 Allergy status to other drugs, medicaments and biological substances status: Secondary | ICD-10-CM | POA: Diagnosis not present

## 2019-11-02 DIAGNOSIS — Z807 Family history of other malignant neoplasms of lymphoid, hematopoietic and related tissues: Secondary | ICD-10-CM | POA: Insufficient documentation

## 2019-11-02 DIAGNOSIS — Z79899 Other long term (current) drug therapy: Secondary | ICD-10-CM | POA: Insufficient documentation

## 2019-11-02 DIAGNOSIS — E119 Type 2 diabetes mellitus without complications: Secondary | ICD-10-CM | POA: Diagnosis not present

## 2019-11-02 DIAGNOSIS — C781 Secondary malignant neoplasm of mediastinum: Secondary | ICD-10-CM

## 2019-11-02 LAB — CBC
HCT: 39.9 % (ref 39.0–52.0)
Hemoglobin: 13.5 g/dL (ref 13.0–17.0)
MCH: 27.3 pg (ref 26.0–34.0)
MCHC: 33.8 g/dL (ref 30.0–36.0)
MCV: 80.8 fL (ref 80.0–100.0)
Platelets: 159 K/uL (ref 150–400)
RBC: 4.94 MIL/uL (ref 4.22–5.81)
RDW: 13.4 % (ref 11.5–15.5)
WBC: 6 K/uL (ref 4.0–10.5)
nRBC: 0 % (ref 0.0–0.2)

## 2019-11-02 LAB — GLUCOSE, CAPILLARY
Glucose-Capillary: 315 mg/dL — ABNORMAL HIGH (ref 70–99)
Glucose-Capillary: 205 mg/dL — ABNORMAL HIGH (ref 70–99)

## 2019-11-02 LAB — PROTIME-INR
INR: 1 (ref 0.8–1.2)
Prothrombin Time: 12.7 s (ref 11.4–15.2)

## 2019-11-02 MED ORDER — INSULIN ASPART 100 UNIT/ML ~~LOC~~ SOLN
10.0000 [IU] | Freq: Once | SUBCUTANEOUS | Status: DC
  Administered 2019-11-02: 11:00:00 10 [IU] via SUBCUTANEOUS

## 2019-11-02 MED ORDER — FENTANYL CITRATE (PF) 100 MCG/2ML IJ SOLN
INTRAMUSCULAR | Status: AC
  Filled 2019-11-02: qty 2

## 2019-11-02 MED ORDER — INSULIN ASPART 100 UNIT/ML ~~LOC~~ SOLN: 10.0000 [IU] | Freq: Once | SUBCUTANEOUS | Status: DC

## 2019-11-02 MED ORDER — INSULIN ASPART 100 UNIT/ML ~~LOC~~ SOLN
SUBCUTANEOUS | Status: AC
  Filled 2019-11-02: qty 1

## 2019-11-02 MED ORDER — SODIUM CHLORIDE 0.9 % IV SOLN: INTRAVENOUS | Status: DC

## 2019-11-02 MED ORDER — FENTANYL CITRATE (PF) 100 MCG/2ML IJ SOLN
INTRAMUSCULAR | Status: AC | PRN
  Administered 2019-11-02: 25 ug via INTRAVENOUS

## 2019-11-02 MED ORDER — MIDAZOLAM HCL 2 MG/2ML IJ SOLN
INTRAMUSCULAR | Status: AC
  Filled 2019-11-02: qty 2

## 2019-11-02 MED ORDER — MIDAZOLAM HCL 2 MG/2ML IJ SOLN
INTRAMUSCULAR | Status: AC | PRN
  Administered 2019-11-02: 0.5 mg via INTRAVENOUS

## 2019-11-02 NOTE — Discharge Instructions (Signed)
Moderate Conscious Sedation, Adult, Care After °These instructions provide you with information about caring for yourself after your procedure. Your health care provider may also give you more specific instructions. Your treatment has been planned according to current medical practices, but problems sometimes occur. Call your health care provider if you have any problems or questions after your procedure. °What can I expect after the procedure? °After your procedure, it is common: °· To feel sleepy for several hours. °· To feel clumsy and have poor balance for several hours. °· To have poor judgment for several hours. °· To vomit if you eat too soon. °Follow these instructions at home: °For at least 24 hours after the procedure: ° °· Do not: °? Participate in activities where you could fall or become injured. °? Drive. °? Use heavy machinery. °? Drink alcohol. °? Take sleeping pills or medicines that cause drowsiness. °? Make important decisions or sign legal documents. °? Take care of children on your own. °· Rest. °Eating and drinking °· Follow the diet recommended by your health care provider. °· If you vomit: °? Drink water, juice, or soup when you can drink without vomiting. °? Make sure you have little or no nausea before eating solid foods. °General instructions °· Have a responsible adult stay with you until you are awake and alert. °· Take over-the-counter and prescription medicines only as told by your health care provider. °· If you smoke, do not smoke without supervision. °· Keep all follow-up visits as told by your health care provider. This is important. °Contact a health care provider if: °· You keep feeling nauseous or you keep vomiting. °· You feel light-headed. °· You develop a rash. °· You have a fever. °Get help right away if: °· You have trouble breathing. °This information is not intended to replace advice given to you by your health care provider. Make sure you discuss any questions you have  with your health care provider. °Document Revised: 05/24/2017 Document Reviewed: 10/01/2015 °Elsevier Patient Education © 2020 [employer] °Liver Biopsy, Care After °These instructions give you information on caring for yourself after your procedure. Your doctor may also give you more specific instructions. Call your doctor if you have any problems or questions after your procedure. °What can I expect after the procedure? °After the procedure, it is common to have: °· Pain and soreness where the biopsy was done. °· Bruising around the area where the biopsy was done. °· Sleepiness and be tired for a few days. °Follow these instructions at home: °Medicines °· Take over-the-counter and prescription medicines only as told by your doctor. °· If you were prescribed an antibiotic medicine, take it as told by your doctor. Do not stop taking the antibiotic even if you start to feel better. °· Do not take medicines such as aspirin and ibuprofen. These medicines can thin your blood. Do not take these medicines unless your doctor tells you to take them. °· If you are taking prescription pain medicine, take actions to prevent or treat constipation. Your doctor may recommend that you: °? Drink enough fluid to keep your pee (urine) clear or pale yellow. °? Take over-the-counter or prescription medicines. °? Eat foods that are high in fiber, such as fresh fruits and vegetables, whole grains, and beans. °? Limit foods that are high in fat and processed sugars, such as fried and sweet foods. °Caring for your cut °· Follow instructions from your doctor about how to take care of your cuts from surgery (incisions).   Make sure you: °? Wash your hands with soap and water before you change your bandage (dressing). If you cannot use soap and water, use hand sanitizer. °? Change your bandage as told by your doctor. °? Leave stitches (sutures), skin glue, or skin tape (adhesive) strips in place. They may need to stay in place for 2 weeks or  longer. If tape strips get loose and curl up, you may trim the loose edges. Do not remove tape strips completely unless your doctor says it is okay. °· Check your cuts every day for signs of infection. Check for: °? Redness, swelling, or more pain. °? Fluid or blood. °? Pus or a bad smell. °? Warmth. °· Do not take baths, swim, or use a hot tub until your doctor says it is okay to do so. °Activity ° °· Rest at home for 1-2 days or as told by your doctor. °? Avoid sitting for a long time without moving. Get up to take short walks every 1-2 hours. °· Return to your normal activities as told by your doctor. Ask what activities are safe for you. °· Do not do these things in the first 24 hours: °? Drive. °? Use machinery. °? Take a bath or shower. °· Do not lift more than 10 pounds (4.5 kg) or play contact sports for the first 2 weeks. °General instructions ° °· Do not drink alcohol in the first week after the procedure. °· Have someone stay with you for at least 24 hours after the procedure. °· Get your test results. Ask your doctor or the department that is doing the test: °? When will my results be ready? °? How will I get my results? °? What are my treatment options? °? What other tests do I need? °? What are my next steps? °· Keep all follow-up visits as told by your doctor. This is important. °Contact a doctor if: °· A cut bleeds and leaves more than just a small spot of blood. °· A cut is red, puffs up (swells), or hurts more than before. °· Fluid or something else comes from a cut. °· A cut smells bad. °· You have a fever or chills. °Get help right away if: °· You have swelling, bloating, or pain in your belly (abdomen). °· You get dizzy or faint. °· You have a rash. °· You feel sick to your stomach (nauseous) or throw up (vomit). °· You have trouble breathing, feel short of breath, or feel faint. °· Your chest hurts. °· You have problems talking or seeing. °· You have trouble with your balance or moving your  arms or legs. °Summary °· After the procedure, it is common to have pain, soreness, bruising, and tiredness. °· Your doctor will tell you how to take care of yourself at home. Change your bandage, take your medicines, and limit your activities as told by your doctor. °· Call your doctor if you have symptoms of infection. Get help right away if your belly swells, your cut bleeds a lot, or you have trouble talking or breathing. °This information is not intended to replace advice given to you by your health care provider. Make sure you discuss any questions you have with your health care provider. °Document Revised: 06/21/2017 Document Reviewed: 06/21/2017 °Elsevier Patient Education © 2020 Else[employer] Inc. ° °

## 2019-11-02 NOTE — Procedures (Signed)
Interventional Radiology Procedure Note  Procedure: US Guided Biopsy of right lobe liver lesion  Complications: None  Estimated Blood Loss: < 10 mL  Findings: 18 G core biopsy of right lobe liver lesion performed under US guidance.  Two core samples obtained and sent to Pathology.  Venetia Night. Kathlene Cote, M.D Pager:  [phone]

## 2019-11-02 NOTE — Consult Note (Signed)
Chief Complaint: Patient was seen in consultation today for liver biopsy at the request of Brahmanday,Govinda R  Referring Physician(s): Brahmanday,Govinda R  Patient Status: [facility] - Out-pt  History of Present Illness: Kevin Mckay is a 69 y.o. 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 Has been treated with chemotherapy and radiation therapy for his lung carcinoma. Now with enlarging right lobe liver lesion in segment VII measuring roughly 2.5 cm and potentional subtle new 1 cm lesion in inferior right lobe in segment VI by MRI. Now presents for US guided biopsy of the larger right hepatic lesion. No current abdominal complaints.   Past Medical History:  Diagnosis Date  . Asthma   . Cancer ([place])    Metastatic small cell lung cancer  . Cerebral aneurysm   . Chronic painful diabetic neuropathy ([place])   . Depression   . Diabetes mellitus without complication ([place])   . Essential hypertension   . History of kidney stones   . Occasional tremors   . Tobacco use     Past Surgical History:  Procedure Laterality Date  . ABDOMINAL SURGERY     age 85. trauma surgery due to forklift injury- liver and spleen  . COLONOSCOPY WITH PROPOFOL N/A 03/27/2019   Procedure: COLONOSCOPY WITH PROPOFOL;  Surgeon: Jonathon Bellows, MD;  Location: [facility] ENDOSCOPY;  Service: Gastroenterology;  Laterality: N/A;  . COLOSTOMY REVISION Right 05/04/2019   Procedure: COLON RESECTION RIGHT-right hemicolectomy-open;  Surgeon: Fredirick Maudlin, MD;  Location: [facility] ORS;  Service: General;  Laterality: Right;  . ESOPHAGOGASTRODUODENOSCOPY (EGD) WITH PROPOFOL N/A 03/27/2019   Procedure: ESOPHAGOGASTRODUODENOSCOPY (EGD) WITH PROPOFOL;  Surgeon: Jonathon Bellows, MD;  Location: [facility] ENDOSCOPY;  Service: Gastroenterology;  Laterality: N/A;  . IR GENERIC HISTORICAL  05/31/2016   IR ANGIO VERTEBRAL SEL  VERTEBRAL BILAT MOD SED 05/31/2016 Consuella Lose, MD [facility]-INTERV RAD  . IR GENERIC HISTORICAL  05/31/2016   IR ANGIO INTRA EXTRACRAN SEL INTERNAL CAROTID BILAT MOD SED 05/31/2016 Consuella Lose, MD [facility]INTER[facility]  . PORTACATH PLACEMENT Right 02/04/2019   Procedure: INSERTION PORT-A-CATH;  Surgeon: [name], MD;  Location: [facility] ORS;  Service: General;  Laterality: Right;  . THORACOTOMY Left 01/22/2019   Procedure: PRE OP BRONCH LEFT ANTERIOR THORACOTOMY WITH BIOPSY OF MEDIASTINAL MASS;  Surgeon: [name], MD;  Location: [facility] ORS;  Service: General;  Laterality: Left;    Allergies: Ferumoxytol, Amoxicillin, and Codeine  Medications: Prior to Admission medications   Medication Sig Start Date End Date Taking? Authorizing Provider  amLODipine (NORVASC) 10 MG tablet Take 10 mg by mouth every morning.    Yes [provider]  budesonide-formoterol (SYMBICORT) 160-4.5 MCG/ACT inhaler Inhale 2 puffs into the lungs 2 (two) times daily.   Yes [provider]  DULoxetine (CYMBALTA) 30 MG capsule Take 30 mg by mouth every morning.    Yes [provider]  ferrous sulfate 324 (65 Fe) MG TBEC Take 324 mg by mouth daily.    Yes [provider]  gabapentin (NEURONTIN) 300 MG capsule Take 600 mg by mouth 3 (three) times daily.  04/06/19  Yes [provider]  hydrochlorothiazide (HYDRODIURIL) 25 MG tablet Take 25 mg by mouth daily.  01/06/19  Yes [provider]  insulin glargine (LANTUS) 100 UNIT/ML injection Inject into the skin daily. Units based By sliding scale   Yes [provider]  Ipratropium-Albuterol (COMBIVENT) 20-100 MCG/ACT AERS respimat Inhale  into the lungs. 07/22/19 07/21/20 Yes [provider]  linaclotide (LINZESS) 145 MCG CAPS capsule Take 145 mcg by mouth daily before breakfast.   Yes [provider]  lovastatin (MEVACOR) 20 MG tablet Take 20 mg by mouth every evening.   Yes [provider]    omeprazole (PRILOSEC) 40 MG capsule Take 1 capsule (40 mg total) by mouth daily. Patient taking differently: Take 40 mg by mouth every morning.  03/19/19  Yes Jonathon Bellows, MD  polyethylene glycol powder [facility]) 17 GM/SCOOP powder Mix full container in 64 ounces of Gatorade or other clear liquid. NO RED Liquids 04/09/19  Yes Fredirick Maudlin, MD  prazosin (MINIPRESS) 1 MG capsule Take 1 mg by mouth at bedtime.   Yes [provider]  sitaGLIPtin (JANUVIA) 25 MG tablet Take 25 mg by mouth daily.   Yes [provider]  vitamin B-12 (CYANOCOBALAMIN) 100 MCG tablet Take 100 mcg by mouth daily.   Yes [provider]  lidocaine-prilocaine (EMLA) cream Apply 1 application topically as needed. 30-45 mins prior to port access. 01/29/19   Cammie Sickle, MD  metFORMIN (GLUCOPHAGE) 1000 MG tablet Take 1,000 mg by mouth 2 (two) times daily with a meal.    [provider]  Multiple Vitamins-Minerals (MULTIVITAMIN WITH MINERALS) tablet Take 1 tablet by mouth daily.    [provider]  ondansetron (ZOFRAN) 8 MG tablet Take 1 tablet (8 mg total) by mouth every 8 (eight) hours as needed for nausea or vomiting (start 3 days; after chemo). Patient not taking: Reported on 06/18/2019 01/29/19   Cammie Sickle, MD  psyllium (METAMUCIL) 58.6 % powder Take 1 packet by mouth daily.    [provider]     Family History  Problem Relation Age of Onset  . Lymphoma Father   . Liver cancer Paternal Uncle   . Lung cancer Paternal Uncle   . Lung cancer Maternal Uncle     Social History   Socioeconomic History  . Marital status: Married    Spouse name: Not on file  . Number of children: Not on file  . Years of education: Not on file  . Highest education level: Not on file  Occupational History  . Not on file  Tobacco Use  . Smoking status: Current Every Day Smoker    Packs/day: 0.50    Years: 50.00    Pack years: 25.00    Types: Cigarettes  .  Smokeless tobacco: Never Used  Substance and Sexual Activity  . Alcohol use: No  . Drug use: Yes    Types: Barbituates, Marijuana  . Sexual activity: Not on file  Other Topics Concern  . Not on file  Social History Narrative   Lives in [place]; wife/ son/grandson [custody]; smoker; vending business; hx of alcoholism- quit at 41.    Social Determinants of Health   Financial Resource Strain: Low Risk   . Difficulty of Paying Living Expenses: Not hard at all  Food Insecurity: No Food Insecurity  . Worried About Charity fundraiser in the Last Year: Never true  . Ran Out of Food in the Last Year: Never true  Transportation Needs: No Transportation Needs  . Lack of Transportation (Medical): No  . Lack of Transportation (Non-Medical): No  Physical Activity: Inactive  . Days of Exercise per Week: 0 days  . Minutes of Exercise per Session: 0 min  Stress: No Stress Concern Present  . Feeling of Stress : Not at all  Social  Connections: Unknown  . Frequency of Communication with Friends and Family: Patient refused  . Frequency of Social Gatherings with Friends and Family: Patient refused  . Attends Religious Services: Patient refused  . Active Member of Clubs or Organizations: Patient refused  . Attends Archivist Meetings: Patient refused  . Marital Status: Patient refused    ECOG Status: 1 - Symptomatic but completely ambulatory  Review of Systems: A 12 point ROS discussed and pertinent positives are indicated in the HPI above.  All other systems are negative.  Review of Systems  Constitutional: Positive for fatigue.  Respiratory: Positive for shortness of breath.   Cardiovascular: Negative.   Gastrointestinal: Negative.   Genitourinary: Negative.   Musculoskeletal: Negative.   Neurological: Negative.     Vital Signs: BP 130/69   Pulse 69   Temp 98 F (36.7 C) (Oral)   Resp 16   Ht 5\' 8"  (1.727 m)   Wt 68 kg   SpO2 97%   BMI 22.81 kg/m   Physical  Exam Vitals and nursing note reviewed.  Constitutional:      General: He is not in acute distress.    Appearance: He is normal weight. He is not toxic-appearing or diaphoretic.  Cardiovascular:     Rate and Rhythm: Normal rate and regular rhythm.     Heart sounds: No murmur. No gallop.   Pulmonary:     Effort: Pulmonary effort is normal. No respiratory distress.     Breath sounds: Normal breath sounds. No stridor. No wheezing, rhonchi or rales.  Abdominal:     General: Abdomen is flat. Bowel sounds are normal. There is no distension.     Palpations: Abdomen is soft.     Tenderness: There is no abdominal tenderness. There is no guarding or rebound.  Musculoskeletal:        General: No swelling.  Skin:    General: Skin is warm and dry.  Neurological:     General: No focal deficit present.     Mental Status: He is alert and oriented to person, place, and time.     Imaging: MR LIVER W WO CONTRAST  Result Date: 10/21/2019 CLINICAL DATA:  History of colon cancer and lung cancer. Follow-up liver lesion. EXAM: MRI ABDOMEN WITHOUT AND WITH CONTRAST TECHNIQUE: Multiplanar multisequence MR imaging of the abdomen was performed both before and after the administration of intravenous contrast. CONTRAST:  68mL GADAVIST GADOBUTROL 1 MMOL/ML IV SOLN COMPARISON:  MRI 04/08/2019. FINDINGS: Lower chest: No acute findings. Hepatobiliary: Focal area of restricted diffusion is identified within segment 7 corresponding to the previously noted suspected liver metastases. This measures 2.7 x 2.7 cm, image 22/14. On 04/08/2019 this measured 1.2 x 1.7 cm. On 08/20/2019 this measured 1.3 x 1.2 cm. There is a new focus of arterial phase enhancement localizing to the medial margin of segment 6 measuring 1 cm, image 59/13. Collapsed gallbladder. No biliary dilatation. Pancreas: Head of pancreas appears normal. Body and tail of pancreas are not well visualized and may be atrophic or surgically absent. Spleen:  Within  normal limits in size and appearance. Adrenals/Urinary Tract: Normal appearance of the adrenal glands. No kidney mass or hydronephrosis Stomach/Bowel: Visualized portions within the abdomen are unremarkable. Vascular/Lymphatic: Aortic atherosclerosis. No aneurysm. No abdominal adenopathy. 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 Other:  No ascites Musculoskeletal: No suspicious bone lesions identified. IMPRESSION: 1. Interval increase in size of liver metastasis within segment 7 compared with 08/20/2019 and 04/08/2019. 2. There is a new,  subtle focus of arterial phase enhancement localizing to the medial margin of segment 6 measuring 1 cm. New focus of metastasis cannot be excluded. Attention in this area on follow-up imaging is advised. 3.  Aortic Atherosclerosis (ICD10-I70.0). Electronically Signed   By: Kerby Moors M.D.   On: 10/21/2019 12:20    Labs:  CBC: Recent Labs    08/20/19 0806 10/23/19 1022 10/29/19 1138 11/02/19 0955  WBC 5.6 5.8 6.1 6.0  HGB 10.9* 11.2* 12.8* 13.5  HCT 34.5* 33.5* 38.1* 39.9  PLT 155 157 160 159    COAGS: Recent Labs    10/29/19 1138 11/02/19 0955  INR 0.9 1.0  APTT 31  --     BMP: Recent Labs    06/02/19 0840 06/22/19 0825 08/20/19 0806 10/23/19 1022  NA 135 133* 139 130*  K 3.8 3.7 3.9 4.0  CL 100 97* 99 93*  CO2 27 23 31 27   GLUCOSE 273* 287* 175* 444*  BUN 16 17 26* 22  CALCIUM 9.3 9.3 9.2 8.6*  CREATININE 1.05 1.04 1.04 1.15  GFRNONAA >60 >60 >60 >60  GFRAA >60 >60 >60 >60    LIVER FUNCTION TESTS: Recent Labs    06/02/19 0840 06/22/19 0825 08/20/19 0806 10/23/19 1022  BILITOT 0.4 0.4 0.5 0.3  AST 14* 16 15 13*  ALT 12 13 12 13   ALKPHOS 58 70 52 78  PROT 6.9 7.1 7.1 6.5  ALBUMIN 3.9 3.9 4.1 3.5    TUMOR MARKERS: No results for input(s): AFPTM, CEA, CA199, CHROMGRNA in the last 8760 hours.  Assessment and Plan:  For biopsy of roughly 2.5 cm superior and lateral right lobe liver lesion today under US guidance.  Risks and benefits of liver biopsy was discussed with the patient and/or patient's family including, but not limited to bleeding, infection, damage to adjacent structures or low yield requiring additional tests. All of the questions were answered and there is agreement to proceed. Consent signed and in chart.  Thank you for this interesting consult.  I greatly enjoyed meeting Kevin Mckay and look forward to participating in their care.  A copy of this report was sent to the requesting provider on this date.  Electronically Signed: Azzie Roup, MD 11/02/2019, 10:43 AM   I spent a total of 30 Minutes  in face to face in clinical consultation, greater than 50% of which was counseling/coordinating care for liver biopsy.

## 2019-11-02 NOTE — Progress Notes (Signed)
Patient clinically stable post Liver Biopsy per Dr Kathlene Cote, tolerated well. Vitals stable pre/post procedure, denies complaints at this time. Dressing to right lateral abdomen dry/intact. Awake/alert and oriented post procedure.received Versed 0.5mg  along with Fentanyl 72mcg IV for procedure. Eating lunch without difficulty.

## 2019-11-04 LAB — SURGICAL PATHOLOGY

## 2019-11-05 ENCOUNTER — Inpatient Hospital Stay (HOSPITAL_BASED_OUTPATIENT_CLINIC_OR_DEPARTMENT_OTHER): Payer: BC Managed Care – PPO | Admitting: Internal Medicine

## 2019-11-05 ENCOUNTER — Other Ambulatory Visit: Payer: Self-pay

## 2019-11-05 ENCOUNTER — Ambulatory Visit
Admission: RE | Admit: 2019-11-05 | Discharge: 2019-11-05 | Disposition: A | Discharge status: Home / Self Care | Payer: BC Managed Care – PPO | Source: Ambulatory Visit | Attending: Internal Medicine | Admitting: Internal Medicine

## 2019-11-05 ENCOUNTER — Inpatient Hospital Stay: Payer: BC Managed Care – PPO

## 2019-11-05 VITALS — BP 153/68 | HR 63 | Temp 94.9°F | Resp 18 | Wt 140.0 lb

## 2019-11-05 DIAGNOSIS — Z452 Encounter for adjustment and management of vascular access device: Secondary | ICD-10-CM | POA: Diagnosis not present

## 2019-11-05 DIAGNOSIS — Z95828 Presence of other vascular implants and grafts: Secondary | ICD-10-CM

## 2019-11-05 DIAGNOSIS — C781 Secondary malignant neoplasm of mediastinum: Secondary | ICD-10-CM | POA: Insufficient documentation

## 2019-11-05 DIAGNOSIS — C182 Malignant neoplasm of ascending colon: Secondary | ICD-10-CM | POA: Diagnosis not present

## 2019-11-05 DIAGNOSIS — Z7189 Other specified counseling: Secondary | ICD-10-CM | POA: Diagnosis not present

## 2019-11-05 DIAGNOSIS — C801 Malignant (primary) neoplasm, unspecified: Secondary | ICD-10-CM | POA: Insufficient documentation

## 2019-11-05 MED ORDER — SODIUM CHLORIDE 0.9% FLUSH
10.0000 mL | Freq: Once | INTRAVENOUS | Status: AC
  Administered 2019-11-05: 10 mL via INTRAVENOUS
  Filled 2019-11-05: qty 10

## 2019-11-05 MED ORDER — IOHEXOL 300 MG/ML  SOLN
30.0000 mL | Freq: Once | INTRAMUSCULAR | Status: AC | PRN
  Administered 2019-11-05: 30 mL

## 2019-11-05 NOTE — Progress Notes (Signed)
[place] NOTE  Patient Care Team: Laneta Simmers, NP as PCP - General (Nurse Practitioner) Telford Nab, RN as Registered Nurse Clent Jacks, RN as Oncology Nurse Navigator  CHIEF COMPLAINTS/PURPOSE OF CONSULTATION: Lung cancer/colon cancer   Oncology History Overview Note  # July 2020- SMALL CELL CA METASTATIC TO MEDIASTINAL LN [open Bx; Dr.Oaks]; TxN2M0; July 2nd 2020-PET- 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  MRI brain negative  # aug 17th 2020-carbo etoposide-RT [? 8/19]; #4 cycle carbo-Etop [finished dec 30th,2020]  #August 2020 iron deficient anemia-question etiology; IV Feraheme [no colonoscopy]  # OCT 2020- colo/ Cecal adeno ca; MRI liver- 1 cm enhancing lesion "metastasis" [Dr.Anna/Dr.Cannon]-difficulty biopsy.  November 02-2019 right hemicolectomy- STAGE III [pT3pN1 (2/13LN)]- HOLD adjuvant therapy. MMR- INTACT/LOW  #May 2021-liver biopsy-possible adenocarcinoma; colorectal origin.  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 [facility])   02/09/2019 - 07/12/2019 Chemotherapy   The patient had palonosetron (ALOXI) injection 0.25 mg, 0.25 mg, Intravenous,  Once, 4 of 4 cycles Administration: 0.25 mg (02/09/2019), 0.25 mg (03/03/2019), 0.25 mg (06/02/2019), 0.25 mg (06/22/2019) CARBOplatin (PARAPLATIN) 410 mg in sodium chloride 0.9 % 250 mL chemo infusion, 410 mg (100 % of original dose 414 mg), Intravenous,   Once, 4 of 4 cycles Dose modification:   (original dose 414 mg, Cycle 1) Administration: 410 mg (02/09/2019), 410 mg (03/03/2019), 410 mg (06/02/2019), 410 mg (06/22/2019) etoposide (VEPESID) 180 mg in sodium chloride 0.9 % 500 mL chemo infusion, 100 mg/m2 = 180 mg, Intravenous,  Once, 4 of 4 cycles Administration: 180 mg (02/09/2019), 180 mg (02/10/2019), 180 mg (02/11/2019), 180 mg (03/03/2019), 180 mg (03/04/2019), 180 mg (03/05/2019), 180 mg (06/02/2019), 180 mg (06/03/2019), 180 mg (06/04/2019), 180 mg (06/22/2019), 180 mg (06/23/2019), 180 mg (06/24/2019) fosaprepitant (EMEND) 150 mg, dexamethasone (DECADRON) 6 mg in sodium chloride 0.9 % 145 mL IVPB, , Intravenous,  Once, 2 of 2 cycles Administration:  (06/02/2019),  (06/22/2019)  for chemotherapy treatment.    Cancer of ascending colon ([place])  05/04/2019 Initial Diagnosis   Cancer of ascending colon ([place])   11/05/2019 -  Chemotherapy   The patient had PALONOSETRON HCL INJECTION 0.25 MG/5ML, 0.25 mg, Intravenous,  Once, 0 of 5 cycles leucovorin 700 mg in dextrose 5 % 250 mL infusion, 400 mg/m2, Intravenous,  Once, 0 of 5 cycles oxaliplatin (ELOXATIN) 150 mg in dextrose 5 % 500 mL chemo infusion, 85 mg/m2, Intravenous,  Once, 0 of 5 cycles fluorouracil (ADRUCIL) chemo injection 700 mg, 400 mg/m2, Intravenous,  Once, 0 of 5 cycles fluorouracil (ADRUCIL) 4,200 mg in sodium chloride 0.9 % 66 mL chemo infusion, 2,400 mg/m2, Intravenous, 1 Day/Dose, 0 of 5 cycles bevacizumab-bvzr (ZIRABEV) 300 mg in sodium chloride 0.9 % 100 mL chemo infusion, 5 mg/kg, Intravenous,  Once, 0 of 5 cycles  for chemotherapy treatment.     HISTORY OF PRESENTING ILLNESS:   Kevin Mckay 69 y.o.  male synchronous primaries -limited stage small cell-likely lung primary s/p concurrent chemoradiation carboplatin etoposide; and also stage III colon  cancer-is here to review the results of the liver biopsy.  Patient had liver lesion noted for the last 4 to 6 months; unfortunately it  was small unable to be biopsied.  Most recently the lesion got bigger up to 2.5 cm when it was biopsied.  Patient denies any falls.  Any headaches.  No nausea no vomiting.  Review of Systems  Constitutional: Positive for malaise/fatigue. Negative for chills, diaphoresis, fever and weight loss.  HENT: Negative for nosebleeds and sore throat.   Eyes: Negative for double vision.  Respiratory: Positive for cough, sputum production and shortness of breath. Negative for hemoptysis and wheezing.   Cardiovascular: Negative for chest pain, palpitations, orthopnea and leg swelling.  Gastrointestinal: Negative for abdominal pain, blood in stool, constipation, diarrhea, heartburn, melena, nausea and vomiting.  Genitourinary: Negative for dysuria, frequency and urgency.  Musculoskeletal: Negative for back pain and joint pain.  Skin: Negative.  Negative for itching and rash.  Neurological: Negative for dizziness, focal weakness and weakness.  Endo/Heme/Allergies: Does not bruise/bleed easily.  Psychiatric/Behavioral: Negative for depression. The patient is not nervous/anxious and does not have insomnia.      MEDICAL HISTORY:  Past Medical History:  Diagnosis Date  . Asthma   . Cancer ([place])    Metastatic small cell lung cancer  . Cerebral aneurysm   . Chronic painful diabetic neuropathy ([place])   . Depression   . Diabetes mellitus without complication ([place])   . Essential hypertension   . History of kidney stones   . Occasional tremors   . Tobacco use     SURGICAL HISTORY: Past Surgical History:  Procedure Laterality Date  . ABDOMINAL SURGERY     age 17. trauma surgery due to forklift injury- liver and spleen  . COLONOSCOPY WITH PROPOFOL N/A 03/27/2019   Procedure: COLONOSCOPY WITH PROPOFOL;  Surgeon: Jonathon Bellows, MD;  Location: [facility] ENDOSCOPY;  Service: Gastroenterology;  Laterality: N/A;  . COLOSTOMY REVISION Right 05/04/2019   Procedure: COLON RESECTION RIGHT-right hemicolectomy-open;   Surgeon: Fredirick Maudlin, MD;  Location: [facility] ORS;  Service: General;  Laterality: Right;  . ESOPHAGOGASTRODUODENOSCOPY (EGD) WITH PROPOFOL N/A 03/27/2019   Procedure: ESOPHAGOGASTRODUODENOSCOPY (EGD) WITH PROPOFOL;  Surgeon: Jonathon Bellows, MD;  Location: [facility] ENDOSCOPY;  Service: Gastroenterology;  Laterality: N/A;  . IR GENERIC HISTORICAL  05/31/2016   IR ANGIO VERTEBRAL SEL VERTEBRAL BILAT MOD SED 05/31/2016 Consuella Lose, MD [facility]-INTERV RAD  . IR GENERIC HISTORICAL  05/31/2016   IR ANGIO INTRA EXTRACRAN SEL INTERNAL CAROTID BILAT MOD SED 05/31/2016 Consuella Lose, MD [facility]INTER[facility]  . PORTACATH PLACEMENT Right 02/04/2019   Procedure: INSERTION PORT-A-CATH;  Surgeon: [name], MD;  Location: [facility] ORS;  Service: General;  Laterality: Right;  . THORACOTOMY Left 01/22/2019   Procedure: PRE OP BRONCH LEFT ANTERIOR THORACOTOMY WITH BIOPSY OF MEDIASTINAL MASS;  Surgeon: [name], MD;  Location: [facility] ORS;  Service: General;  Laterality: Left;    SOCIAL HISTORY: Social History   Socioeconomic History  . Marital status: Married    Spouse name: Not on file  . Number of children: Not on file  . Years of education: Not on file  . Highest education level: Not on file  Occupational History  . Not on file  Tobacco Use  . Smoking status: Current Every Day Smoker    Packs/day: 0.50    Years: 50.00    Pack years: 25.00    Types: Cigarettes  . Smokeless tobacco: Never Used  Substance and Sexual Activity  . Alcohol use: No  .  Drug use: Yes    Types: Barbituates, Marijuana  . Sexual activity: Not on file  Other Topics Concern  . Not on file  Social History Narrative   Lives in [place]; wife/ son/grandson [custody]; smoker; vending business; hx of alcoholism- quit at 71.    Social Determinants of Health   Financial Resource Strain: Low Risk   . Difficulty of Paying Living Expenses: Not hard at all  Food Insecurity: No Food Insecurity  . Worried About Charity fundraiser in the  Last Year: Never true  . Ran Out of Food in the Last Year: Never true  Transportation Needs: No Transportation Needs  . Lack of Transportation (Medical): No  . Lack of Transportation (Non-Medical): No  Physical Activity: Inactive  . Days of Exercise per Week: 0 days  . Minutes of Exercise per Session: 0 min  Stress: No Stress Concern Present  . Feeling of Stress : Not at all  Social Connections: Unknown  . Frequency of Communication with Friends and Family: Patient refused  . Frequency of Social Gatherings with Friends and Family: Patient refused  . Attends Religious Services: Patient refused  . Active Member of Clubs or Organizations: Patient refused  . Attends Archivist Meetings: Patient refused  . Marital Status: Patient refused  Intimate Partner Violence: Not At Risk  . Fear of Current or Ex-Partner: No  . Emotionally Abused: No  . Physically Abused: No  . Sexually Abused: No    FAMILY HISTORY: Family History  Problem Relation Age of Onset  . Lymphoma Father   . Liver cancer Paternal Uncle   . Lung cancer Paternal Uncle   . Lung cancer Maternal Uncle     ALLERGIES:  is allergic to ferumoxytol; amoxicillin; and codeine.  MEDICATIONS:  Current Outpatient Medications  Medication Sig Dispense Refill  . amLODipine (NORVASC) 10 MG tablet Take 10 mg by mouth every morning.     . budesonide-formoterol (SYMBICORT) 160-4.5 MCG/ACT inhaler Inhale 2 puffs into the lungs 2 (two) times daily.    . DULoxetine (CYMBALTA) 30 MG capsule Take 30 mg by mouth every morning.     . ferrous sulfate 324 (65 Fe) MG TBEC Take 324 mg by mouth daily.     Marland Kitchen gabapentin (NEURONTIN) 300 MG capsule Take 600 mg by mouth 3 (three) times daily.     . hydrochlorothiazide (HYDRODIURIL) 25 MG tablet Take 25 mg by mouth daily.     . insulin glargine (LANTUS) 100 UNIT/ML injection Inject into the skin daily. Units based By sliding scale    . Ipratropium-Albuterol (COMBIVENT) 20-100 MCG/ACT AERS  respimat Inhale into the lungs.    . lidocaine-prilocaine (EMLA) cream Apply 1 application topically as needed. 30-45 mins prior to port access. 30 g 0  . linaclotide (LINZESS) 145 MCG CAPS capsule Take 145 mcg by mouth daily before breakfast.    . lovastatin (MEVACOR) 20 MG tablet Take 20 mg by mouth every evening.    . metFORMIN (GLUCOPHAGE) 1000 MG tablet Take 1,000 mg by mouth 2 (two) times daily with a meal.    . omeprazole (PRILOSEC) 40 MG capsule Take 1 capsule (40 mg total) by mouth daily. (Patient taking differently: Take 40 mg by mouth every morning. ) 90 capsule 1  . polyethylene glycol powder (MIRALAX) 17 GM/SCOOP powder Mix full container in 64 ounces of Gatorade or other clear liquid. NO RED Liquids 238 g 0  . prazosin (MINIPRESS) 1 MG capsule Take 1 mg by mouth at bedtime.    Marland Kitchen  psyllium (METAMUCIL) 58.6 % powder Take 1 packet by mouth daily.    . sitaGLIPtin (JANUVIA) 25 MG tablet Take 25 mg by mouth daily.    . vitamin B-12 (CYANOCOBALAMIN) 100 MCG tablet Take 100 mcg by mouth daily.    . Multiple Vitamins-Minerals (MULTIVITAMIN WITH MINERALS) tablet Take 1 tablet by mouth daily.    . ondansetron (ZOFRAN) 8 MG tablet Take 1 tablet (8 mg total) by mouth every 8 (eight) hours as needed for nausea or vomiting (start 3 days; after chemo). (Patient not taking: Reported on 06/18/2019) 40 tablet 1   No current facility-administered medications for this visit.      Marland Kitchen  PHYSICAL EXAMINATION: ECOG PERFORMANCE STATUS: 0 - Asymptomatic  Vitals:   11/05/19 0908  BP: (!) 153/68  Pulse: 63  Resp: 18  Temp: (!) 94.9 F (34.9 C)  SpO2: 100%   Filed Weights   11/05/19 0908  Weight: 140 lb (63.5 kg)    Physical Exam  Constitutional: He is oriented to person, place, and time and well-developed, well-nourished, and in no distress.  HENT:  Head: Normocephalic and atraumatic.  Mouth/Throat: Oropharynx is clear and moist. No oropharyngeal exudate.  Eyes: Pupils are equal, round, and  reactive to light.  Cardiovascular: Normal rate and regular rhythm.  Pulmonary/Chest: No respiratory distress. He has no wheezes.  Decreased air entry bilaterally.  Abdominal: Soft. Bowel sounds are normal. He exhibits no distension and no mass. There is no abdominal tenderness. There is no rebound and no guarding.  Abdominal incision well-healed.  Musculoskeletal:        General: No tenderness or edema. Normal range of motion.     Cervical back: Normal range of motion and neck supple.  Neurological: He is alert and oriented to person, place, and time.  Skin: Skin is warm.  Psychiatric: Affect normal.    LABORATORY DATA:  I have reviewed the data as listed Lab Results  Component Value Date   WBC 6.0 11/02/2019   HGB 13.5 11/02/2019   HCT 39.9 11/02/2019   MCV 80.8 11/02/2019   PLT 159 11/02/2019   Recent Labs    06/22/19 0825 08/20/19 0806 10/23/19 1022  NA 133* 139 130*  K 3.7 3.9 4.0  CL 97* 99 93*  CO2 _0 GLUCOSE 287* 175* 444*  BUN 17 26* 22  CREATININE 1.04 1.04 1.15  CALCIUM 9.3 9.2 8.6*  GFRNONAA >60 >60 >60  GFRAA >60 >60 >60  PROT 7.1 7.1 6.5  ALBUMIN 3.9 4.1 3.5  AST 16 15 13*  ALT _1 ALKPHOS 70 52 78  BILITOT 0.4 0.5 0.3    RADIOGRAPHIC STUDIES: I have personally reviewed the radiological images as listed and agreed with the findings in the report. MR LIVER W WO CONTRAST  Result Date: 10/21/2019 CLINICAL DATA:  History of colon cancer and lung cancer. Follow-up liver lesion. EXAM: MRI ABDOMEN WITHOUT AND WITH CONTRAST TECHNIQUE: Multiplanar multisequence MR imaging of the abdomen was performed both before and after the administration of intravenous contrast. CONTRAST:  66m GADAVIST GADOBUTROL 1 MMOL/ML IV SOLN COMPARISON:  MRI 04/08/2019. FINDINGS: Lower chest: No acute findings. Hepatobiliary: Focal area of restricted diffusion is identified within segment 7 corresponding to the previously noted suspected liver metastases. This measures 2.7  x 2.7 cm, image 22/14. On 04/08/2019 this measured 1.2 x 1.7 cm. On 08/20/2019 this measured 1.3 x 1.2 cm. There is a new focus of arterial phase enhancement localizing to the medial margin  of segment 6 measuring 1 cm, image 59/13. Collapsed gallbladder. No biliary dilatation. Pancreas: Head of pancreas appears normal. Body and tail of pancreas are not well visualized and may be atrophic or surgically absent. Spleen:  Within normal limits in size and appearance. Adrenals/Urinary Tract: Normal appearance of the adrenal glands. No kidney mass or hydronephrosis Stomach/Bowel: Visualized portions within the abdomen are unremarkable. Vascular/Lymphatic: Aortic atherosclerosis. No aneurysm. No abdominal adenopathy. 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 Other:  No ascites Musculoskeletal: No suspicious bone lesions identified. IMPRESSION: 1. Interval increase in size of liver metastasis within segment 7 compared with 08/20/2019 and 04/08/2019. 2. There is a new, subtle focus of arterial phase enhancement localizing to the medial margin of segment 6 measuring 1 cm. New focus of metastasis cannot be excluded. Attention in this area on follow-up imaging is advised. 3.  Aortic Atherosclerosis (ICD10-I70.0). Electronically Signed   By: Kerby Moors M.D.   On: 10/21/2019 12:20   US BIOPSY (LIVER)  Result Date: 11/02/2019 INDICATION: History of small-cell lung carcinoma and adenocarcinoma of the cecum with enlarging lesion in the peripheral and superior aspect of the right lobe of the liver suspicious for metastatic disease. EXAM: ULTRASOUND GUIDED CORE BIOPSY OF LIVER MEDICATIONS: None. ANESTHESIA/SEDATION: Fentanyl 25 mcg IV; Versed 0.5 mg IV Moderate Sedation Time:  14 minutes. The patient was continuously monitored during the procedure by the interventional radiology nurse under my direct supervision. PROCEDURE: The procedure, risks, benefits, and alternatives were explained to the patient. Questions regarding  the procedure were encouraged and answered. The patient understands and consents to the procedure. A time-out was performed prior to initiating the procedure. Initial detailed ultrasound was performed of the liver utilizing various ultrasound probes. The right abdominal wall was prepped with chlorhexidine in a sterile fashion, and a sterile drape was applied covering the operative field. A sterile gown and sterile gloves were used for the procedure. Local anesthesia was provided with 1% Lidocaine. Under ultrasound guidance, a 17 gauge trocar needle was advanced into the periphery of the right lobe of the liver. After confirming needle tip position, 2 separate coaxial 18 gauge core biopsy samples were obtained and submitted in formalin. Gel-Foam pledgets were advanced through the outer needle as the needle was retracted and removed. Additional ultrasound was performed. COMPLICATIONS: None immediate. FINDINGS: It was initially very difficult to localize the lesion measuring roughly 2-2.5 cm by prior imaging in the superior and lateral aspect of the right lobe of the liver. Vaguely marginated lesion was eventually identified by using a linear probe with limited window of visualization between a ribbed and the diaphragm. Solid tissue was obtained. IMPRESSION: Ultrasound-guided biopsy performed of a lesion within the superior and lateral aspect of the right lobe of the liver near the diaphragm. It was extremely difficult to visualize this lesion by ultrasound but it was felt that an abnormal area of tissue corresponding to the region of the lesion by prior MRI and CT was visible by ultrasound and this area was sampled. Electronically Signed   By: Aletta Edouard M.D.   On: 11/02/2019 13:05   DG Fluoro Guide CV Line Right  Result Date: 11/05/2019 CLINICAL DATA:  History of metastatic lung and colon carcinoma. Indwelling right-sided Port-A-Cath since August, 2020. After access today, there was scant blood return with  aspiration and request has been made to perform a port injection for further evaluation. EXAM: CONTRAST INJECTION OF PORT A CATH UNDER FLUOROSCOPY CONTRAST:  7 mL Omnipaque 300 FLUOROSCOPY TIME:  24 seconds. 24 mGy. PROCEDURE: Contrast was administered via the indwelling port after it was accessed. Fluoroscopic spot images were obtained of the catheter during injection. FINDINGS: Port reservoir is normally patent. Attached catheter tubing is intact and patent without evidence of contrast extravasation. The catheter tip is positioned at the SVC/RA junction. Contrast does exit the catheter tip. There is slight eccentric streaming of the contrast exiting the catheter tip consistent with component of fibrin sheath. This does not appear severe but would account for some limitation in ability to aspirate blood. After the injection procedure and flushing with saline, there was ability to freely aspirate blood from the port reservoir. The port was flushed and the [facility] needle removed on completion of the injection procedure as the port is not scheduled to be used today. IMPRESSION: Component of fibrin sheath at the tip of the port catheter causing some mild eccentric streaming of contrast material but no significant occlusion. The port itself is intact without contrast extravasation. After the injection procedure today and flushing, there was ability to freely aspirate blood from the port. Electronically Signed   By: Aletta Edouard M.D.   On: 11/05/2019 12:29    ASSESSMENT & PLAN:   Cancer of ascending colon ([place]) #Right colon cancer stage IV; adenocarcinoma [s/p liver biopsy]; reviewed the pathology; stage of cancer in detail.  Discussed that surgery for liver lesion could be considered after few cycles of chemotherapy if response noted.  I suspect given the location/in the context of's limited stage small cell lung cancer-upfront surgery would be risky.   #I would recommend starting of the chemotherapy-FOLFOX  plus Avastin. I discussed that FOLFOX chemotherapy is given every 2 weeks; discuss the potential side effects including but not limited to nausea vomiting diarrhea, sores in the mouth, hand-foot syndrome; also tingling and numbness/cold sensitivity with oxaliplatin. Reviewed the rationale for using Avastin. Discussed the potential side effects including but not limited to elevated blood pressure ; nephrotic syndrome wound healing problems.  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 s/p carbo etoposide with concurrent radiation x4 cycles.  CT scan August 20, 2019-improved/stable mediastinal lymphadenopathy.  We will need to monitor closely.  # Anemia- secondary- sec to chemo- Hb 10.9-stable  # Poorly controlled diabetes blood sugars-recommend close monitoring with PCP.  #Port malfunction-dye study; shows-fibrin sheath however continues to flush/dry at this time.  If worsening would recommend TPA.  # DISPOSITION: # dye study ASAP # follow up on 05/24-MD; labs-cbc/cmp/cea; UA; FOLFOX chemo+ Avastin- Dr.B   All questions were answered. The patient knows to call the clinic with any problems, questions or concerns.    Cammie Sickle, MD 11/05/2019 9:11 PM

## 2019-11-05 NOTE — Progress Notes (Deleted)
Accessed port a cath prior to dye study. Port flushes easily with Normal Saline. No resistance felt. Scant blood return noted. Per Dr. Rogue Bussing - proceed with dye study in fluro as planned. Nira Conn, RN provided hand off to [place], Therapist, sports in [employer].

## 2019-11-05 NOTE — Progress Notes (Signed)
Pt in for follow up and liver biopsy results.

## 2019-11-05 NOTE — Assessment & Plan Note (Addendum)
#  Right colon cancer stage IV; adenocarcinoma [s/p liver biopsy]; reviewed the pathology; stage of cancer in detail.  Discussed that surgery for liver lesion could be considered after few cycles of chemotherapy if response noted.  I suspect given the location/in the context of's limited stage small cell lung cancer-upfront surgery would be risky.   #I would recommend starting of the chemotherapy-FOLFOX plus Avastin. I discussed that FOLFOX chemotherapy is given every 2 weeks; discuss the potential side effects including but not limited to nausea vomiting diarrhea, sores in the mouth, hand-foot syndrome; also tingling and numbness/cold sensitivity with oxaliplatin. Reviewed the rationale for using Avastin. Discussed the potential side effects including but not limited to elevated blood pressure ; nephrotic syndrome wound healing problems.  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 s/p carbo etoposide with concurrent radiation x4 cycles.  CT scan August 20, 2019-improved/stable mediastinal lymphadenopathy.  We will need to monitor closely.  # Anemia- secondary- sec to chemo- Hb 10.9-stable  # Poorly controlled diabetes blood sugars-recommend close monitoring with PCP.  #Port malfunction-dye study; shows-fibrin sheath however continues to flush/dry at this time.  If worsening would recommend TPA.  # DISPOSITION: # dye study ASAP # follow up on 05/24-MD; labs-cbc/cmp/cea; UA; FOLFOX chemo+ Avastin- Dr.B

## 2019-11-05 NOTE — Progress Notes (Signed)
Accessed port a cath prior to dye study. Port flushes easily with Normal Saline. No resistance felt. Scant blood return noted. Per Dr. Rogue Bussing - proceed with dye study in fluro as planned. Nira Conn, RN provided hand off to [place], Therapist, sports in [employer].

## 2019-11-05 NOTE — Progress Notes (Signed)
START ON PATHWAY REGIMEN - Colorectal     A cycle is every 14 days:     Bevacizumab-xxxx      Oxaliplatin      Leucovorin      Fluorouracil      Fluorouracil   **Always confirm dose/schedule in your pharmacy ordering system**  Patient Characteristics: Distant Metastases, Nonsurgical Candidate, KRAS/NRAS Mutation Positive/Unknown (BRAF V600 Wild-Type/Unknown), Standard Cytotoxic Therapy, First Line Standard Cytotoxic Therapy, Bevacizumab Eligible, PS = 0,1 Tumor Location: Colon Therapeutic Status: Distant Metastases Microsatellite/Mismatch Repair Status: Unknown BRAF Mutation Status: Awaiting Test Results KRAS/NRAS Mutation Status: Awaiting Test Results Standard Cytotoxic Line of Therapy: First Line Standard Cytotoxic Therapy ECOG Performance Status: 0 Bevacizumab Eligibility: Eligible Intent of Therapy: Non-Curative / Palliative Intent, Discussed with Patient

## 2019-11-09 NOTE — Progress Notes (Signed)
Pharmacist Chemotherapy Monitoring - Initial Assessment    Anticipated start date: 11/16/19  Regimen:  . Are orders appropriate based on the patient's diagnosis, regimen, and cycle? Yes . Does the plan date match the patient's scheduled date? Yes . Is the sequencing of drugs appropriate? Yes . Are the premedications appropriate for the patient's regimen? Yes . Prior Authorization for treatment is: Approved o If applicable, is the correct biosimilar selected based on the patient's insurance? yes  Organ Function and Labs: Marland Kitchen Are dose adjustments needed based on the patient's renal function, hepatic function, or hematologic function? No . Are appropriate labs ordered prior to the start of patient's treatment? Yes . Other organ system assessment, if indicated: bevacizumab: baseline BP . The following baseline labs, if indicated, have been ordered: bevacizumab: urine protein  Dose Assessment: . Are the drug doses appropriate? Yes . Are the following correct: o Drug concentrations Yes o IV fluid compatible with drug Yes o Administration routes Yes o Timing of therapy Yes . If applicable, does the patient have documented access for treatment and/or plans for port-a-cath placement? not applicable . If applicable, have lifetime cumulative doses been properly documented and assessed? yes Lifetime Dose Tracking  . Carboplatin: 1,640 mg = 0.01 % of the maximum lifetime dose of 999,999,999 mg  o   Toxicity Monitoring/Prevention: . The patient has the following take home antiemetics prescribed: Ondansetron, Prochlorperazine, Dexamethasone and Lorazepam . The patient has the following take home medications prescribed: N/A . Medication allergies and previous infusion related reactions, if applicable, have been reviewed and addressed. Yes . The patient's current medication list has been assessed for drug-drug interactions with their chemotherapy regimen. no significant drug-drug interactions were  identified on review.  Order Review: . Are the treatment plan orders signed? Yes . Is the patient scheduled to see a provider prior to their treatment? Yes  I verify that I have reviewed each item in the above checklist and answered each question accordingly.  Kevin Mckay 11/09/2019 8:24 AM

## 2019-11-16 ENCOUNTER — Telehealth: Payer: Self-pay | Admitting: *Deleted

## 2019-11-16 ENCOUNTER — Inpatient Hospital Stay: Payer: BC Managed Care – PPO

## 2019-11-16 ENCOUNTER — Other Ambulatory Visit: Payer: Self-pay

## 2019-11-16 ENCOUNTER — Encounter: Payer: Self-pay | Admitting: Internal Medicine

## 2019-11-16 ENCOUNTER — Inpatient Hospital Stay: Payer: BC Managed Care – PPO | Admitting: Internal Medicine

## 2019-11-16 DIAGNOSIS — C182 Malignant neoplasm of ascending colon: Secondary | ICD-10-CM | POA: Diagnosis not present

## 2019-11-16 DIAGNOSIS — Z95828 Presence of other vascular implants and grafts: Secondary | ICD-10-CM

## 2019-11-16 DIAGNOSIS — C781 Secondary malignant neoplasm of mediastinum: Secondary | ICD-10-CM

## 2019-11-16 LAB — URINALYSIS, COMPLETE (UACMP) WITH MICROSCOPIC
Bacteria, UA: NONE SEEN
Bilirubin Urine: NEGATIVE
Glucose, UA: >=500 mg/dL — AB
Ketones, ur: NEGATIVE mg/dL
Leukocytes,Ua: NEGATIVE
Nitrite: NEGATIVE
Protein, ur: NEGATIVE mg/dL
Specific Gravity, Urine: 1.009 (ref 1.005–1.030)
Squamous Epithelial / HPF: NONE SEEN (ref 0–5)
pH: 6 (ref 5.0–8.0)

## 2019-11-16 LAB — COMPREHENSIVE METABOLIC PANEL
ALT: 17 U/L (ref 0–44)
AST: 20 U/L (ref 15–41)
Albumin: 3.8 g/dL (ref 3.5–5.0)
Alkaline Phosphatase: 81 U/L (ref 38–126)
Anion gap: 10 (ref 5–15)
BUN: 19 mg/dL (ref 8–23)
CO2: 27 mmol/L (ref 22–32)
Calcium: 9 mg/dL (ref 8.9–10.3)
Chloride: 95 mmol/L — ABNORMAL LOW (ref 98–111)
Creatinine, Ser: 1.12 mg/dL (ref 0.61–1.24)
GFR calc Af Amer: >60 mL/min (ref >60)
GFR calc non Af Amer: >60 mL/min (ref >60)
Glucose, Bld: 422 mg/dL — ABNORMAL HIGH (ref 70–99)
Potassium: 3.6 mmol/L (ref 3.5–5.1)
Sodium: 132 mmol/L — ABNORMAL LOW (ref 135–145)
Total Bilirubin: 0.4 mg/dL (ref 0.3–1.2)
Total Protein: 7 g/dL (ref 6.5–8.1)

## 2019-11-16 LAB — CBC WITH DIFFERENTIAL/PLATELET
Abs Immature Granulocytes: 0.02 10*3/uL (ref 0.00–0.07)
Basophils Absolute: 0 10*3/uL (ref 0.0–0.1)
Basophils Relative: 1 %
Eosinophils Absolute: 0.3 10*3/uL (ref 0.0–0.5)
Eosinophils Relative: 6 %
HCT: 36.7 % — ABNORMAL LOW (ref 39.0–52.0)
Hemoglobin: 12.1 g/dL — ABNORMAL LOW (ref 13.0–17.0)
Immature Granulocytes: 0 %
Lymphocytes Relative: 26 %
Lymphs Abs: 1.4 10*3/uL (ref 0.7–4.0)
MCH: 26.7 pg (ref 26.0–34.0)
MCHC: 33 g/dL (ref 30.0–36.0)
MCV: 81 fL (ref 80.0–100.0)
Monocytes Absolute: 0.5 10*3/uL (ref 0.1–1.0)
Monocytes Relative: 10 %
Neutro Abs: 2.9 10*3/uL (ref 1.7–7.7)
Neutrophils Relative %: 57 %
Platelets: 129 10*3/uL — ABNORMAL LOW (ref 150–400)
RBC: 4.53 MIL/uL (ref 4.22–5.81)
RDW: 13.6 % (ref 11.5–15.5)
WBC: 5.2 10*3/uL (ref 4.0–10.5)
nRBC: 0 % (ref 0.0–0.2)

## 2019-11-16 MED ORDER — SODIUM CHLORIDE 0.9% FLUSH
10.0000 mL | INTRAVENOUS | Status: DC | PRN
  Administered 2019-11-16: 10 mL via INTRAVENOUS
  Filled 2019-11-16: qty 10

## 2019-11-16 MED ORDER — HEPARIN SOD (PORK) LOCK FLUSH 100 UNIT/ML IV SOLN
500.0000 [IU] | Freq: Once | INTRAVENOUS | Status: AC
  Administered 2019-11-16: 500 [IU]
  Filled 2019-11-16: qty 5

## 2019-11-16 MED ORDER — HEPARIN SOD (PORK) LOCK FLUSH 100 UNIT/ML IV SOLN
INTRAVENOUS | Status: AC
  Filled 2019-11-16: qty 5

## 2019-11-16 NOTE — Assessment & Plan Note (Addendum)
#  Right colon cancer stage IV; adenocarcinoma [s/p liver biopsy]-recommend NGS. Plan FOLFOX + avastin.  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  S/p concurrent radiation x4 cycles.  CT scan August 20, 2019-improved/stable mediastinal lymphadenopathy-stable  # Anemia- secondary- sec to chemo-hemoglobin on 12 stable  # Poorly controlled diabetes blood sugars-blood sugars today 422; patient currently just on Lantus/will likely need sliding scale short acting insulin.  I am concerned his blood sugars are going to go up because of premedication with steroids.  Discussed with patient.  Recommend follow-up with PCP.  I will also try to reach out to the patient's PCP-to help control blood sugars.   # DISPOSITION: # HOLD chemo today; de-access # follow up in 2 weeks-MD; labs-cbc/cmp/cea; UA; FOLFOX chemo+ Avastin- Dr.B

## 2019-11-16 NOTE — Progress Notes (Signed)
[place] NOTE  Patient Care Team: Laneta Simmers, NP as PCP - General (Nurse Practitioner) Telford Nab, RN as Registered Nurse Clent Jacks, RN as Oncology Nurse Navigator  CHIEF COMPLAINTS/PURPOSE OF CONSULTATION: Lung cancer/colon cancer   Oncology History Overview Note  # July 2020- SMALL CELL CA METASTATIC TO MEDIASTINAL LN [open Bx; Dr.Oaks]; TxN2M0; July 2nd 2020-PET- 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  MRI brain negative  # aug 17th 2020-carbo etoposide-RT [? 8/19]; #4 cycle carbo-Etop [finished dec 30th,2020]  #August 2020 iron deficient anemia-question etiology; IV Feraheme [no colonoscopy]  # OCT 2020- colo/ Cecal adeno ca; MRI liver- 1 cm enhancing lesion "metastasis" [Dr.Anna/Dr.Cannon]-difficulty biopsy.  November 02-2019 right hemicolectomy- STAGE III [pT3pN1 (2/13LN)]- HOLD adjuvant therapy. MMR- INTACT/LOW  #May 2021-liver biopsy-possible adenocarcinoma; colorectal origin.  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 [facility])   02/09/2019 - 07/12/2019 Chemotherapy   The patient had palonosetron (ALOXI) injection 0.25 mg, 0.25 mg, Intravenous,  Once, 4 of 4 cycles Administration: 0.25 mg (02/09/2019), 0.25 mg (03/03/2019), 0.25 mg (06/02/2019), 0.25 mg (06/22/2019) CARBOplatin (PARAPLATIN) 410 mg in sodium chloride 0.9 % 250 mL chemo infusion, 410 mg (100 % of original dose 414 mg), Intravenous,   Once, 4 of 4 cycles Dose modification:   (original dose 414 mg, Cycle 1) Administration: 410 mg (02/09/2019), 410 mg (03/03/2019), 410 mg (06/02/2019), 410 mg (06/22/2019) etoposide (VEPESID) 180 mg in sodium chloride 0.9 % 500 mL chemo infusion, 100 mg/m2 = 180 mg, Intravenous,  Once, 4 of 4 cycles Administration: 180 mg (02/09/2019), 180 mg (02/10/2019), 180 mg (02/11/2019), 180 mg (03/03/2019), 180 mg (03/04/2019), 180 mg (03/05/2019), 180 mg (06/02/2019), 180 mg (06/03/2019), 180 mg (06/04/2019), 180 mg (06/22/2019), 180 mg (06/23/2019), 180 mg (06/24/2019) fosaprepitant (EMEND) 150 mg, dexamethasone (DECADRON) 6 mg in sodium chloride 0.9 % 145 mL IVPB, , Intravenous,  Once, 2 of 2 cycles Administration:  (06/02/2019),  (06/22/2019)  for chemotherapy treatment.    Cancer of ascending colon ([place])  05/04/2019 Initial Diagnosis   Cancer of ascending colon ([place])   11/27/2019 -  Chemotherapy   The patient had palonosetron (ALOXI) injection 0.25 mg, 0.25 mg, Intravenous,  Once, 0 of 5 cycles leucovorin 700 mg in dextrose 5 % 250 mL infusion, 400 mg/m2, Intravenous,  Once, 0 of 5 cycles oxaliplatin (ELOXATIN) 150 mg in dextrose 5 % 500 mL chemo infusion, 85 mg/m2, Intravenous,  Once, 0 of 5 cycles fluorouracil (ADRUCIL) chemo injection 700 mg, 400 mg/m2, Intravenous,  Once, 0 of 5 cycles fluorouracil (ADRUCIL) 4,200 mg in sodium chloride 0.9 % 66 mL chemo infusion, 2,400 mg/m2, Intravenous, 1 Day/Dose, 0 of 5 cycles bevacizumab-bvzr (ZIRABEV) 300 mg in sodium chloride 0.9 % 100 mL chemo infusion, 5 mg/kg, Intravenous,  Once, 0 of 5 cycles  for chemotherapy treatment.     HISTORY OF PRESENTING ILLNESS:   Kevin Mckay 69 y.o.  male synchronous primaries-synchronous primaries limited stage small cell lung cancer s/p chemoradiation; and stage IV colon cancer-with metastasis to  liver disease to proceed with chemotherapy.  Patient states his blood sugars were 300 this morning fasting.  He had a bowl of cereal  this morning.   Denies any nausea vomiting.  Appetite is fair.  Noted to have some blood in stools resolved spontaneously.  No nausea no vomiting.  Review of Systems  Constitutional: Positive for malaise/fatigue. Negative for chills, diaphoresis, fever and weight loss.  HENT: Negative for nosebleeds and sore throat.   Eyes: Negative for double vision.  Respiratory: Positive for cough, sputum production and shortness of breath. Negative for hemoptysis and wheezing.   Cardiovascular: Negative for chest pain, palpitations, orthopnea and leg swelling.  Gastrointestinal: Negative for abdominal pain, blood in stool, constipation, diarrhea, heartburn, melena, nausea and vomiting.  Genitourinary: Negative for dysuria, frequency and urgency.  Musculoskeletal: Negative for back pain and joint pain.  Skin: Negative.  Negative for itching and rash.  Neurological: Negative for dizziness, focal weakness and weakness.  Endo/Heme/Allergies: Does not bruise/bleed easily.  Psychiatric/Behavioral: Negative for depression. The patient is not nervous/anxious and does not have insomnia.      MEDICAL HISTORY:  Past Medical History:  Diagnosis Date  . Asthma   . Cancer ([place])    Metastatic small cell lung cancer  . Cerebral aneurysm   . Chronic painful diabetic neuropathy ([place])   . Depression   . Diabetes mellitus without complication ([place])   . Essential hypertension   . History of kidney stones   . Occasional tremors   . Tobacco use     SURGICAL HISTORY: Past Surgical History:  Procedure Laterality Date  . ABDOMINAL SURGERY     age 65. trauma surgery due to forklift injury- liver and spleen  . COLONOSCOPY WITH PROPOFOL N/A 03/27/2019   Procedure: COLONOSCOPY WITH PROPOFOL;  Surgeon: Jonathon Bellows, MD;  Location: [facility] ENDOSCOPY;  Service: Gastroenterology;  Laterality: N/A;  . COLOSTOMY REVISION Right 05/04/2019   Procedure: COLON RESECTION RIGHT-right hemicolectomy-open;  Surgeon: Fredirick Maudlin,  MD;  Location: [facility] ORS;  Service: General;  Laterality: Right;  . ESOPHAGOGASTRODUODENOSCOPY (EGD) WITH PROPOFOL N/A 03/27/2019   Procedure: ESOPHAGOGASTRODUODENOSCOPY (EGD) WITH PROPOFOL;  Surgeon: Jonathon Bellows, MD;  Location: [facility] ENDOSCOPY;  Service: Gastroenterology;  Laterality: N/A;  . IR GENERIC HISTORICAL  05/31/2016   IR ANGIO VERTEBRAL SEL VERTEBRAL BILAT MOD SED 05/31/2016 Consuella Lose, MD [facility]-INTERV RAD  . IR GENERIC HISTORICAL  05/31/2016   IR ANGIO INTRA EXTRACRAN SEL INTERNAL CAROTID BILAT MOD SED 05/31/2016 Consuella Lose, MD [facility]-INTER[facility] RAD  . PORTACATH PLACEMENT Right 02/04/2019   Procedure: INSERTION PORT-A-CATH;  Surgeon: [name], MD;  Location: [facility] ORS;  Service: General;  Laterality: Right;  . THORACOTOMY Left 01/22/2019   Procedure: PRE OP BRONCH LEFT ANTERIOR THORACOTOMY WITH BIOPSY OF MEDIASTINAL MASS;  Surgeon: [name], MD;  Location: [facility] ORS;  Service: General;  Laterality: Left;    SOCIAL HISTORY: Social History   Socioeconomic History  . Marital status: Married    Spouse name: Not on file  . Number of children: Not on file  . Years of education: Not on file  . Highest education level: Not on file  Occupational History  . Not on file  Tobacco Use  . Smoking status: Current Every Day Smoker    Packs/day: 0.50    Years: 50.00    Pack years: 25.00    Types: Cigarettes  . Smokeless tobacco: Never Used  Substance and Sexual Activity  . Alcohol use: No  . Drug use: Yes    Types:  Barbituates, Marijuana  . Sexual activity: Not on file  Other Topics Concern  . Not on file  Social History Narrative   Lives in [place]; wife/ son/grandson [custody]; smoker; vending business; hx of alcoholism- quit at 3.    Social Determinants of Health   Financial Resource Strain: Low Risk   . Difficulty of Paying Living Expenses: Not hard at all  Food Insecurity: No Food Insecurity  . Worried About Charity fundraiser in the Last Year: Never true  . Ran  Out of Food in the Last Year: Never true  Transportation Needs: No Transportation Needs  . Lack of Transportation (Medical): No  . Lack of Transportation (Non-Medical): No  Physical Activity: Inactive  . Days of Exercise per Week: 0 days  . Minutes of Exercise per Session: 0 min  Stress: No Stress Concern Present  . Feeling of Stress : Not at all  Social Connections: Unknown  . Frequency of Communication with Friends and Family: Patient refused  . Frequency of Social Gatherings with Friends and Family: Patient refused  . Attends Religious Services: Patient refused  . Active Member of Clubs or Organizations: Patient refused  . Attends Archivist Meetings: Patient refused  . Marital Status: Patient refused  Intimate Partner Violence: Not At Risk  . Fear of Current or Ex-Partner: No  . Emotionally Abused: No  . Physically Abused: No  . Sexually Abused: No    FAMILY HISTORY: Family History  Problem Relation Age of Onset  . Lymphoma Father   . Liver cancer Paternal Uncle   . Lung cancer Paternal Uncle   . Lung cancer Maternal Uncle     ALLERGIES:  is allergic to ferumoxytol; amoxicillin; and codeine.  MEDICATIONS:  Current Outpatient Medications  Medication Sig Dispense Refill  . amLODipine (NORVASC) 10 MG tablet Take 10 mg by mouth every morning.     . budesonide-formoterol (SYMBICORT) 160-4.5 MCG/ACT inhaler Inhale 2 puffs into the lungs 2 (two) times daily.    . DULoxetine (CYMBALTA) 30 MG capsule Take 30 mg by mouth every morning.     . ferrous sulfate 324 (65 Fe) MG TBEC Take 324 mg by mouth daily.     Marland Kitchen gabapentin (NEURONTIN) 300 MG capsule Take 600 mg by mouth 3 (three) times daily.     . hydrochlorothiazide (HYDRODIURIL) 25 MG tablet Take 25 mg by mouth daily.     . insulin glargine (LANTUS) 100 UNIT/ML injection Inject into the skin daily. Units based By sliding scale    . Ipratropium-Albuterol (COMBIVENT) 20-100 MCG/ACT AERS respimat Inhale into the lungs.     . lidocaine-prilocaine (EMLA) cream Apply 1 application topically as needed. 30-45 mins prior to port access. 30 g 0  . linaclotide (LINZESS) 145 MCG CAPS capsule Take 145 mcg by mouth daily before breakfast.    . lovastatin (MEVACOR) 20 MG tablet Take 20 mg by mouth every evening.    . metFORMIN (GLUCOPHAGE) 1000 MG tablet Take 1,000 mg by mouth 2 (two) times daily with a meal.    . omeprazole (PRILOSEC) 40 MG capsule Take 1 capsule (40 mg total) by mouth daily. (Patient taking differently: Take 40 mg by mouth every morning. ) 90 capsule 1  . polyethylene glycol powder (MIRALAX) 17 GM/SCOOP powder Mix full container in 64 ounces of Gatorade or other clear liquid. NO RED Liquids 238 g 0  . prazosin (MINIPRESS) 1 MG capsule Take 1 mg by mouth at bedtime.    . psyllium (METAMUCIL) 58.6 %  powder Take 1 packet by mouth daily.    . sitaGLIPtin (JANUVIA) 25 MG tablet Take 25 mg by mouth daily.    . vitamin B-12 (CYANOCOBALAMIN) 100 MCG tablet Take 100 mcg by mouth daily.    . Multiple Vitamins-Minerals (MULTIVITAMIN WITH MINERALS) tablet Take 1 tablet by mouth daily.    . ondansetron (ZOFRAN) 8 MG tablet Take 1 tablet (8 mg total) by mouth every 8 (eight) hours as needed for nausea or vomiting (start 3 days; after chemo). (Patient not taking: Reported on 06/18/2019) 40 tablet 1   No current facility-administered medications for this visit.      Marland Kitchen  PHYSICAL EXAMINATION: ECOG PERFORMANCE STATUS: 0 - Asymptomatic  Vitals:   11/16/19 0920  BP: (!) 144/71  Pulse: 65  Resp: 18  Temp: (!) 96.5 F (35.8 C)  SpO2: 100%   Filed Weights   11/16/19 0920  Weight: 146 lb 6.4 oz (66.4 kg)    Physical Exam  Constitutional: He is oriented to person, place, and time and well-developed, well-nourished, and in no distress.  HENT:  Head: Normocephalic and atraumatic.  Mouth/Throat: Oropharynx is clear and moist. No oropharyngeal exudate.  Eyes: Pupils are equal, round, and reactive to light.   Cardiovascular: Normal rate and regular rhythm.  Pulmonary/Chest: No respiratory distress. He has no wheezes.  Decreased air entry bilaterally.  Abdominal: Soft. Bowel sounds are normal. He exhibits no distension and no mass. There is no abdominal tenderness. There is no rebound and no guarding.  Abdominal incision well-healed.  Musculoskeletal:        General: No tenderness or edema. Normal range of motion.     Cervical back: Normal range of motion and neck supple.  Neurological: He is alert and oriented to person, place, and time.  Skin: Skin is warm.  Psychiatric: Affect normal.    LABORATORY DATA:  I have reviewed the data as listed Lab Results  Component Value Date   WBC 5.2 11/16/2019   HGB 12.1 (L) 11/16/2019   HCT 36.7 (L) 11/16/2019   MCV 81.0 11/16/2019   PLT 129 (L) 11/16/2019   Recent Labs    08/20/19 0806 10/23/19 1022 11/16/19 0847  NA 139 130* 132*  K 3.9 4.0 3.6  CL 99 93* 95*  CO2 '31 27 27  ' GLUCOSE 175* 444* 422*  BUN 26* 22 19  CREATININE 1.04 1.15 1.12  CALCIUM 9.2 8.6* 9.0  GFRNONAA >60 >60 >60  GFRAA >60 >60 >60  PROT 7.1 6.5 7.0  ALBUMIN 4.1 3.5 3.8  AST 15 13* 20  ALT '12 13 17  ' ALKPHOS 52 78 81  BILITOT 0.5 0.3 0.4    RADIOGRAPHIC STUDIES: I have personally reviewed the radiological images as listed and agreed with the findings in the report. MR LIVER W WO CONTRAST  Result Date: 10/21/2019 CLINICAL DATA:  History of colon cancer and lung cancer. Follow-up liver lesion. EXAM: MRI ABDOMEN WITHOUT AND WITH CONTRAST TECHNIQUE: Multiplanar multisequence MR imaging of the abdomen was performed both before and after the administration of intravenous contrast. CONTRAST:  25m GADAVIST GADOBUTROL 1 MMOL/ML IV SOLN COMPARISON:  MRI 04/08/2019. FINDINGS: Lower chest: No acute findings. Hepatobiliary: Focal area of restricted diffusion is identified within segment 7 corresponding to the previously noted suspected liver metastases. This measures 2.7 x 2.7  cm, image 22/14. On 04/08/2019 this measured 1.2 x 1.7 cm. On 08/20/2019 this measured 1.3 x 1.2 cm. There is a new focus of arterial phase enhancement localizing to the medial  margin of segment 6 measuring 1 cm, image 59/13. Collapsed gallbladder. No biliary dilatation. Pancreas: Head of pancreas appears normal. Body and tail of pancreas are not well visualized and may be atrophic or surgically absent. Spleen:  Within normal limits in size and appearance. Adrenals/Urinary Tract: Normal appearance of the adrenal glands. No kidney mass or hydronephrosis Stomach/Bowel: Visualized portions within the abdomen are unremarkable. Vascular/Lymphatic: Aortic atherosclerosis. No aneurysm. No abdominal adenopathy. 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 Other:  No ascites Musculoskeletal: No suspicious bone lesions identified. IMPRESSION: 1. Interval increase in size of liver metastasis within segment 7 compared with 08/20/2019 and 04/08/2019. 2. There is a new, subtle focus of arterial phase enhancement localizing to the medial margin of segment 6 measuring 1 cm. New focus of metastasis cannot be excluded. Attention in this area on follow-up imaging is advised. 3.  Aortic Atherosclerosis (ICD10-I70.0). Electronically Signed   By: Kerby Moors M.D.   On: 10/21/2019 12:20   US BIOPSY (LIVER)  Result Date: 11/02/2019 INDICATION: History of small-cell lung carcinoma and adenocarcinoma of the cecum with enlarging lesion in the peripheral and superior aspect of the right lobe of the liver suspicious for metastatic disease. EXAM: ULTRASOUND GUIDED CORE BIOPSY OF LIVER MEDICATIONS: None. ANESTHESIA/SEDATION: Fentanyl 25 mcg IV; Versed 0.5 mg IV Moderate Sedation Time:  14 minutes. The patient was continuously monitored during the procedure by the interventional radiology nurse under my direct supervision. PROCEDURE: The procedure, risks, benefits, and alternatives were explained to the patient. Questions regarding the  procedure were encouraged and answered. The patient understands and consents to the procedure. A time-out was performed prior to initiating the procedure. Initial detailed ultrasound was performed of the liver utilizing various ultrasound probes. The right abdominal wall was prepped with chlorhexidine in a sterile fashion, and a sterile drape was applied covering the operative field. A sterile gown and sterile gloves were used for the procedure. Local anesthesia was provided with 1% Lidocaine. Under ultrasound guidance, a 17 gauge trocar needle was advanced into the periphery of the right lobe of the liver. After confirming needle tip position, 2 separate coaxial 18 gauge core biopsy samples were obtained and submitted in formalin. Gel-Foam pledgets were advanced through the outer needle as the needle was retracted and removed. Additional ultrasound was performed. COMPLICATIONS: None immediate. FINDINGS: It was initially very difficult to localize the lesion measuring roughly 2-2.5 cm by prior imaging in the superior and lateral aspect of the right lobe of the liver. Vaguely marginated lesion was eventually identified by using a linear probe with limited window of visualization between a ribbed and the diaphragm. Solid tissue was obtained. IMPRESSION: Ultrasound-guided biopsy performed of a lesion within the superior and lateral aspect of the right lobe of the liver near the diaphragm. It was extremely difficult to visualize this lesion by ultrasound but it was felt that an abnormal area of tissue corresponding to the region of the lesion by prior MRI and CT was visible by ultrasound and this area was sampled. Electronically Signed   By: Aletta Edouard M.D.   On: 11/02/2019 13:05   DG Fluoro Guide CV Line Right  Result Date: 11/05/2019 CLINICAL DATA:  History of metastatic lung and colon carcinoma. Indwelling right-sided Port-A-Cath since August, 2020. After access today, there was scant blood return with  aspiration and request has been made to perform a port injection for further evaluation. EXAM: CONTRAST INJECTION OF PORT A CATH UNDER FLUOROSCOPY CONTRAST:  7 mL Omnipaque 300 FLUOROSCOPY TIME:  24 seconds. 24 mGy. PROCEDURE: Contrast was administered via the indwelling port after it was accessed. Fluoroscopic spot images were obtained of the catheter during injection. FINDINGS: Port reservoir is normally patent. Attached catheter tubing is intact and patent without evidence of contrast extravasation. The catheter tip is positioned at the SVC/RA junction. Contrast does exit the catheter tip. There is slight eccentric streaming of the contrast exiting the catheter tip consistent with component of fibrin sheath. This does not appear severe but would account for some limitation in ability to aspirate blood. After the injection procedure and flushing with saline, there was ability to freely aspirate blood from the port reservoir. The port was flushed and the [facility] needle removed on completion of the injection procedure as the port is not scheduled to be used today. IMPRESSION: Component of fibrin sheath at the tip of the port catheter causing some mild eccentric streaming of contrast material but no significant occlusion. The port itself is intact without contrast extravasation. After the injection procedure today and flushing, there was ability to freely aspirate blood from the port. Electronically Signed   By: Aletta Edouard M.D.   On: 11/05/2019 12:29    ASSESSMENT & PLAN:   Cancer of ascending colon ([place]) #Right colon cancer stage IV; adenocarcinoma [s/p liver biopsy]-recommend NGS. Plan FOLFOX + avastin.  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  S/p concurrent radiation x4 cycles.  CT scan August 20, 2019-improved/stable mediastinal lymphadenopathy-stable  # Anemia- secondary- sec to  chemo-hemoglobin on 12 stable  # Poorly controlled diabetes blood sugars-blood sugars today 422; patient currently just on Lantus/will likely need sliding scale short acting insulin.  I am concerned his blood sugars are going to go up because of premedication with steroids.  Discussed with patient.  Recommend follow-up with PCP.  I will also try to reach out to the patient's PCP-to help control blood sugars.   # DISPOSITION: # HOLD chemo today; de-access # follow up in 2 weeks-MD; labs-cbc/cmp/cea; UA; FOLFOX chemo+ Avastin- Dr.B   All questions were answered. The patient knows to call the clinic with any problems, questions or concerns.    Cammie Sickle, MD 11/16/2019 9:44 AM

## 2019-11-16 NOTE — Telephone Encounter (Signed)
Left message with CMA Momserrat at [facility] office to please have NP call Dr Rogue Bussing at [phone] regarding pt blood sugars.

## 2019-11-16 NOTE — Progress Notes (Signed)
Pt in for follow up, states eating better. States had a bowel movement and wiped some blood on tissue afterwards.  States has been monitoring movements and has not seen anymore.

## 2019-11-17 LAB — CEA: CEA: 5.7 ng/mL — ABNORMAL HIGH (ref 0.0–4.7)

## 2019-11-18 ENCOUNTER — Telehealth: Payer: Self-pay | Admitting: Internal Medicine

## 2019-11-18 NOTE — Telephone Encounter (Signed)
Spoke to patient PCP, Ms. Laneta Simmers, NP regarding tighter control of blood sugars.  Kindly agrees to make adjustments.

## 2019-11-30 ENCOUNTER — Inpatient Hospital Stay: Payer: BC Managed Care – PPO

## 2019-11-30 ENCOUNTER — Inpatient Hospital Stay: Payer: BC Managed Care – PPO | Admitting: Internal Medicine

## 2019-11-30 ENCOUNTER — Other Ambulatory Visit: Payer: Self-pay

## 2019-11-30 ENCOUNTER — Inpatient Hospital Stay: Payer: BC Managed Care – PPO | Attending: Internal Medicine

## 2019-11-30 ENCOUNTER — Other Ambulatory Visit: Payer: Self-pay | Admitting: Internal Medicine

## 2019-11-30 VITALS — BP 169/71 | HR 71 | Temp 97.0°F | Wt 146.0 lb

## 2019-11-30 DIAGNOSIS — Z9221 Personal history of antineoplastic chemotherapy: Secondary | ICD-10-CM | POA: Diagnosis not present

## 2019-11-30 DIAGNOSIS — Z923 Personal history of irradiation: Secondary | ICD-10-CM | POA: Insufficient documentation

## 2019-11-30 DIAGNOSIS — C182 Malignant neoplasm of ascending colon: Secondary | ICD-10-CM

## 2019-11-30 DIAGNOSIS — D6481 Anemia due to antineoplastic chemotherapy: Secondary | ICD-10-CM | POA: Diagnosis not present

## 2019-11-30 DIAGNOSIS — I10 Essential (primary) hypertension: Secondary | ICD-10-CM | POA: Diagnosis not present

## 2019-11-30 DIAGNOSIS — E1165 Type 2 diabetes mellitus with hyperglycemia: Secondary | ICD-10-CM | POA: Diagnosis not present

## 2019-11-30 DIAGNOSIS — F17210 Nicotine dependence, cigarettes, uncomplicated: Secondary | ICD-10-CM | POA: Insufficient documentation

## 2019-11-30 DIAGNOSIS — Z5111 Encounter for antineoplastic chemotherapy: Secondary | ICD-10-CM | POA: Diagnosis not present

## 2019-11-30 DIAGNOSIS — L97519 Non-pressure chronic ulcer of other part of right foot with unspecified severity: Secondary | ICD-10-CM

## 2019-11-30 DIAGNOSIS — L97511 Non-pressure chronic ulcer of other part of right foot limited to breakdown of skin: Secondary | ICD-10-CM

## 2019-11-30 DIAGNOSIS — Z5112 Encounter for antineoplastic immunotherapy: Secondary | ICD-10-CM | POA: Diagnosis not present

## 2019-11-30 DIAGNOSIS — Z7189 Other specified counseling: Secondary | ICD-10-CM

## 2019-11-30 DIAGNOSIS — C787 Secondary malignant neoplasm of liver and intrahepatic bile duct: Secondary | ICD-10-CM | POA: Diagnosis not present

## 2019-11-30 DIAGNOSIS — C781 Secondary malignant neoplasm of mediastinum: Secondary | ICD-10-CM

## 2019-11-30 DIAGNOSIS — C3490 Malignant neoplasm of unspecified part of unspecified bronchus or lung: Secondary | ICD-10-CM | POA: Insufficient documentation

## 2019-11-30 DIAGNOSIS — J449 Chronic obstructive pulmonary disease, unspecified: Secondary | ICD-10-CM | POA: Diagnosis not present

## 2019-11-30 DIAGNOSIS — Z7952 Long term (current) use of systemic steroids: Secondary | ICD-10-CM | POA: Insufficient documentation

## 2019-11-30 LAB — COMPREHENSIVE METABOLIC PANEL
ALT: 18 U/L (ref 0–44)
AST: 18 U/L (ref 15–41)
Albumin: 3.6 g/dL (ref 3.5–5.0)
Alkaline Phosphatase: 63 U/L (ref 38–126)
Anion gap: 9 (ref 5–15)
BUN: 22 mg/dL (ref 8–23)
CO2: 28 mmol/L (ref 22–32)
Calcium: 9.1 mg/dL (ref 8.9–10.3)
Chloride: 100 mmol/L (ref 98–111)
Creatinine, Ser: 1.08 mg/dL (ref 0.61–1.24)
GFR calc Af Amer: >60 mL/min (ref >60)
GFR calc non Af Amer: >60 mL/min (ref >60)
Glucose, Bld: 224 mg/dL — ABNORMAL HIGH (ref 70–99)
Potassium: 3.9 mmol/L (ref 3.5–5.1)
Sodium: 137 mmol/L (ref 135–145)
Total Bilirubin: 0.5 mg/dL (ref 0.3–1.2)
Total Protein: 6.9 g/dL (ref 6.5–8.1)

## 2019-11-30 LAB — CBC WITH DIFFERENTIAL/PLATELET
Abs Immature Granulocytes: 0.02 10*3/uL (ref 0.00–0.07)
Basophils Absolute: 0 10*3/uL (ref 0.0–0.1)
Basophils Relative: 1 %
Eosinophils Absolute: 0.4 10*3/uL (ref 0.0–0.5)
Eosinophils Relative: 7 %
HCT: 35.4 % — ABNORMAL LOW (ref 39.0–52.0)
Hemoglobin: 11.8 g/dL — ABNORMAL LOW (ref 13.0–17.0)
Immature Granulocytes: 0 %
Lymphocytes Relative: 26 %
Lymphs Abs: 1.4 10*3/uL (ref 0.7–4.0)
MCH: 26.6 pg (ref 26.0–34.0)
MCHC: 33.3 g/dL (ref 30.0–36.0)
MCV: 79.9 fL — ABNORMAL LOW (ref 80.0–100.0)
Monocytes Absolute: 0.6 10*3/uL (ref 0.1–1.0)
Monocytes Relative: 11 %
Neutro Abs: 2.8 10*3/uL (ref 1.7–7.7)
Neutrophils Relative %: 55 %
Platelets: 149 10*3/uL — ABNORMAL LOW (ref 150–400)
RBC: 4.43 MIL/uL (ref 4.22–5.81)
RDW: 14.4 % (ref 11.5–15.5)
WBC: 5.2 10*3/uL (ref 4.0–10.5)
nRBC: 0 % (ref 0.0–0.2)

## 2019-11-30 MED ORDER — LEUCOVORIN CALCIUM INJECTION 350 MG
400.0000 mg/m2 | Freq: Once | INTRAVENOUS | Status: DC
  Filled 2019-11-30: qty 35

## 2019-11-30 MED ORDER — SODIUM CHLORIDE 0.9 % IV SOLN
5.0000 mg/kg | Freq: Once | INTRAVENOUS | Status: DC
  Filled 2019-11-30: qty 12

## 2019-11-30 MED ORDER — SODIUM CHLORIDE 0.9 % IV SOLN
2400.0000 mg/m2 | INTRAVENOUS | Status: DC
  Administered 2019-11-30: 4200 mg via INTRAVENOUS
  Filled 2019-11-30: qty 84

## 2019-11-30 MED ORDER — SODIUM CHLORIDE 0.9 % IV SOLN
4.0000 mg | Freq: Once | INTRAVENOUS | Status: DC
  Filled 2019-11-30 (×2): qty 0.4

## 2019-11-30 MED ORDER — LEUCOVORIN CALCIUM INJECTION 350 MG
400.0000 mg/m2 | Freq: Once | INTRAVENOUS | Status: AC
  Administered 2019-11-30: 700 mg via INTRAVENOUS
  Filled 2019-11-30: qty 35

## 2019-11-30 MED ORDER — OXALIPLATIN CHEMO INJECTION 100 MG/20ML
85.0000 mg/m2 | Freq: Once | INTRAVENOUS | Status: DC
  Filled 2019-11-30: qty 30

## 2019-11-30 MED ORDER — SODIUM CHLORIDE 0.9 % IV SOLN
Freq: Once | INTRAVENOUS | Status: AC
  Filled 2019-11-30: qty 250

## 2019-11-30 MED ORDER — DEXTROSE 5 % IV SOLN
Freq: Once | INTRAVENOUS | Status: DC
  Filled 2019-11-30: qty 250

## 2019-11-30 MED ORDER — PALONOSETRON HCL INJECTION 0.25 MG/5ML
0.2500 mg | Freq: Once | INTRAVENOUS | Status: AC
  Administered 2019-11-30: 0.25 mg via INTRAVENOUS
  Filled 2019-11-30: qty 5

## 2019-11-30 MED ORDER — DEXAMETHASONE SODIUM PHOSPHATE 10 MG/ML IJ SOLN
4.0000 mg | Freq: Once | INTRAMUSCULAR | Status: AC
  Administered 2019-11-30: 4 mg via INTRAVENOUS
  Filled 2019-11-30: qty 1

## 2019-11-30 NOTE — Addendum Note (Signed)
Addended by: Jarrett Ables D on: 11/30/2019 11:01 AM   Modules accepted: Orders

## 2019-11-30 NOTE — Progress Notes (Signed)
0945: Per Dr. Rogue Bussing okay to proceed with scheduled treatment with UA from 11/16/19 and B/P 169/71.   1045: Dr. Rogue Bussing at chairside, per Dr. Rogue Bussing hold Kevin Mckay and Oxaliplatin today, pt to receive Luecovorin and Adrucil pump only. Treatment team aware. Pt aware, and verbalizes understanding.   Reviewed home infusion pump with pt. Pt verbalizes understanding.  Pt tolerated infusion well. Pt stable at discharge.

## 2019-11-30 NOTE — Assessment & Plan Note (Addendum)
#  Right colon cancer stage IV; adenocarcinoma [s/p liver biopsy]-recommend NGS. Plan to start FOLFOX + avastin today.   # proceed withFOLFOX with Avastin chemotherapy today; Labs today reviewed;  acceptable for treatment today.   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  S/p concurrent radiation x4 cycles.  CT scan August 20, 2019-stable.  # Anemia- secondary- sec to chemo-hemoglobin on 12 stable  # Poorly controlled diabetes blood sugars-discussed with PCP; better controlled as per patients.  Will decrease dexamethasone to 6 mg.  # HTN- 160s; recommend control/monitoring esp on avastin.  Recommend getting a cuff at home.  # COPD- worse/wheezing- recommend continue inhalers.   # DISPOSITION: # chemo today;d-3- pump de-access # follow up in 2 weeks-MD; labs-cbc/cmp/cea; UA; FOLFOX chemo+ Avastin; D-3 pump de-access- Dr.B

## 2019-11-30 NOTE — Progress Notes (Signed)
[place] NOTE  Patient Care Team: Laneta Simmers, NP as PCP - General (Nurse Practitioner) Telford Nab, RN as Registered Nurse Clent Jacks, RN as Oncology Nurse Navigator  CHIEF COMPLAINTS/PURPOSE OF CONSULTATION: Lung cancer/colon cancer   Oncology History Overview Note  # July 2020- SMALL CELL CA METASTATIC TO MEDIASTINAL LN [open Bx; Dr.Oaks]; TxN2M0; July 2nd 2020-PET- 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  MRI brain negative  # aug 17th 2020-carbo etoposide-RT [? 8/19]; #4 cycle carbo-Etop [finished dec 30th,2020]  #August 2020 iron deficient anemia-question etiology; IV Feraheme [no colonoscopy]  # OCT 2020- colo/ Cecal adeno ca; MRI liver- 1 cm enhancing lesion "metastasis" [Dr.Anna/Dr.Cannon]-difficulty biopsy.  November 02-2019 right hemicolectomy- STAGE III [pT3pN1 (2/13LN)]- HOLD adjuvant therapy. MMR- INTACT/LOW  #May 2021-liver biopsy-possible adenocarcinoma; colorectal origin.  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 [facility])   02/09/2019 - 07/12/2019 Chemotherapy   The patient had palonosetron (ALOXI) injection 0.25 mg, 0.25 mg, Intravenous,  Once, 4 of 4 cycles Administration: 0.25 mg (02/09/2019), 0.25 mg (03/03/2019), 0.25 mg (06/02/2019), 0.25 mg (06/22/2019) CARBOplatin (PARAPLATIN) 410 mg in sodium chloride 0.9 % 250 mL chemo infusion, 410 mg (100 % of original dose 414 mg), Intravenous,   Once, 4 of 4 cycles Dose modification:   (original dose 414 mg, Cycle 1) Administration: 410 mg (02/09/2019), 410 mg (03/03/2019), 410 mg (06/02/2019), 410 mg (06/22/2019) etoposide (VEPESID) 180 mg in sodium chloride 0.9 % 500 mL chemo infusion, 100 mg/m2 = 180 mg, Intravenous,  Once, 4 of 4 cycles Administration: 180 mg (02/09/2019), 180 mg (02/10/2019), 180 mg (02/11/2019), 180 mg (03/03/2019), 180 mg (03/04/2019), 180 mg (03/05/2019), 180 mg (06/02/2019), 180 mg (06/03/2019), 180 mg (06/04/2019), 180 mg (06/22/2019), 180 mg (06/23/2019), 180 mg (06/24/2019) fosaprepitant (EMEND) 150 mg, dexamethasone (DECADRON) 6 mg in sodium chloride 0.9 % 145 mL IVPB, , Intravenous,  Once, 2 of 2 cycles Administration:  (06/02/2019),  (06/22/2019)  for chemotherapy treatment.    Cancer of ascending colon ([place])  05/04/2019 Initial Diagnosis   Cancer of ascending colon ([place])   11/30/2019 -  Chemotherapy   The patient had palonosetron (ALOXI) injection 0.25 mg, 0.25 mg, Intravenous,  Once, 1 of 5 cycles leucovorin 700 mg in dextrose 5 % 250 mL infusion, 400 mg/m2 = 700 mg, Intravenous,  Once, 1 of 5 cycles oxaliplatin (ELOXATIN) 150 mg in dextrose 5 % 500 mL chemo infusion, 85 mg/m2 = 150 mg, Intravenous,  Once, 1 of 5 cycles fluorouracil (ADRUCIL) 4,200 mg in sodium chloride 0.9 % 66 mL chemo infusion, 2,400 mg/m2 = 4,200 mg, Intravenous, 1 Day/Dose, 1 of 5 cycles bevacizumab-bvzr (ZIRABEV) 300 mg in sodium chloride 0.9 % 100 mL chemo infusion, 5 mg/kg = 300 mg, Intravenous,  Once, 1 of 5 cycles  for chemotherapy treatment.     HISTORY OF PRESENTING ILLNESS:   Kevin Mckay 69 y.o.  male synchronous primaries-synchronous primaries limited stage small cell lung cancer s/p chemoradiation; and stage IV colon cancer-with metastasis to liver disease to  proceed with chemotherapy FOLFOX plus Avastin.  Patient's chemotherapy was held 2 weeks ago because of poorly controlled blood sugars of 400+.  In the interim he has been  evaluated by PCP states his blood sugars are better controlled at home.  Otherwise no nausea no vomiting.  Intermittent wheezing.  Intermittent blood in stools/question hemorrhoids.  Review of Systems  Constitutional: Positive for malaise/fatigue. Negative for chills, diaphoresis, fever and weight loss.  HENT: Negative for nosebleeds and sore throat.   Eyes: Negative for double vision.  Respiratory: Positive for cough, sputum production and shortness of breath. Negative for hemoptysis and wheezing.   Cardiovascular: Negative for chest pain, palpitations, orthopnea and leg swelling.  Gastrointestinal: Negative for abdominal pain, blood in stool, constipation, diarrhea, heartburn, melena, nausea and vomiting.  Genitourinary: Negative for dysuria, frequency and urgency.  Musculoskeletal: Negative for back pain and joint pain.  Skin: Negative.  Negative for itching and rash.  Neurological: Negative for dizziness, focal weakness and weakness.  Endo/Heme/Allergies: Does not bruise/bleed easily.  Psychiatric/Behavioral: Negative for depression. The patient is not nervous/anxious and does not have insomnia.      MEDICAL HISTORY:  Past Medical History:  Diagnosis Date  . Asthma   . Cancer ([place])    Metastatic small cell lung cancer  . Cerebral aneurysm   . Chronic painful diabetic neuropathy ([place])   . Depression   . Diabetes mellitus without complication ([place])   . Essential hypertension   . History of kidney stones   . Occasional tremors   . Tobacco use     SURGICAL HISTORY: Past Surgical History:  Procedure Laterality Date  . ABDOMINAL SURGERY     age 78. trauma surgery due to forklift injury- liver and spleen  . COLONOSCOPY WITH PROPOFOL N/A 03/27/2019   Procedure: COLONOSCOPY WITH PROPOFOL;  Surgeon: Jonathon Bellows, MD;  Location[facility] ENDOSCOPY;  Service: Gastroenterology;  Laterality: N/A;  . COLOSTOMY REVISION Right 05/04/2019   Procedure: COLON RESECTION RIGHT-right  hemicolectomy-open;  Surgeon: Fredirick Maudlin, MD;  Location: [facility] ORS;  Service: General;  Laterality: Right;  . ESOPHAGOGASTRODUODENOSCOPY (EGD) WITH PROPOFOL N/A 03/27/2019   Procedure: ESOPHAGOGASTRODUODENOSCOPY (EGD) WITH PROPOFOL;  Surgeon: Jonathon Bellows, MD;  Location: [facility] ENDOSCOPY;  Service: Gastroenterology;  Laterality: N/A;  . IR GENERIC HISTORICAL  05/31/2016   IR ANGIO VERTEBRAL SEL VERTEBRAL BILAT MOD SED 05/31/2016 Consuella Lose, MD [facility]-INTERV RAD  . IR GENERIC HISTORICAL  05/31/2016   IR ANGIO INTRA EXTRACRAN SEL INTERNAL CAROTID BILAT MOD SED 05/31/2016 Consuella Lose, MD [facility]-INTER[facility]  . PORTACATH PLACEMENT Right 02/04/2019   Procedure: INSERTION PORT-A-CATH;  Surgeon: [name], MD;  Location: [facility] ORS;  Service: General;  Laterality: Right;  . THORACOTOMY Left 01/22/2019   Procedure: PRE OP BRONCH LEFT ANTERIOR THORACOTOMY WITH BIOPSY OF MEDIASTINAL MASS;  Surgeon: [name], MD;  Location: [facility] ORS;  Service: General;  Laterality: Left;    SOCIAL HISTORY: Social History   Socioeconomic History  . Marital status: Married    Spouse name: Not on file  . Number of children: Not on file  . Years of education: Not on file  . Highest education level: Not on file  Occupational History  . Not on file  Tobacco Use  . Smoking status: Current Every Day Smoker    Packs/day: 0.50    Years: 50.00    Pack years: 25.00    Types: Cigarettes  . Smokeless tobacco: Never Used  Substance and Sexual Activity  . Alcohol use: No  . Drug use:  Yes    Types: Barbituates, Marijuana  . Sexual activity: Not on file  Other Topics Concern  . Not on file  Social History Narrative   Lives in [place]; wife/ son/grandson [custody]; smoker; vending business; hx of alcoholism- quit at 33.    Social Determinants of Health   Financial Resource Strain: Low Risk   . Difficulty of Paying Living Expenses: Not hard at all  Food Insecurity: No Food Insecurity  . Worried About Paediatric nurse in the Last Year: Never true  . Ran Out of Food in the Last Year: Never true  Transportation Needs: No Transportation Needs  . Lack of Transportation (Medical): No  . Lack of Transportation (Non-Medical): No  Physical Activity: Inactive  . Days of Exercise per Week: 0 days  . Minutes of Exercise per Session: 0 min  Stress: No Stress Concern Present  . Feeling of Stress : Not at all  Social Connections: Unknown  . Frequency of Communication with Friends and Family: Patient refused  . Frequency of Social Gatherings with Friends and Family: Patient refused  . Attends Religious Services: Patient refused  . Active Member of Clubs or Organizations: Patient refused  . Attends Archivist Meetings: Patient refused  . Marital Status: Patient refused  Intimate Partner Violence: Not At Risk  . Fear of Current or Ex-Partner: No  . Emotionally Abused: No  . Physically Abused: No  . Sexually Abused: No    FAMILY HISTORY: Family History  Problem Relation Age of Onset  . Lymphoma Father   . Liver cancer Paternal Uncle   . Lung cancer Paternal Uncle   . Lung cancer Maternal Uncle     ALLERGIES:  is allergic to ferumoxytol; amoxicillin; and codeine.  MEDICATIONS:  Current Outpatient Medications  Medication Sig Dispense Refill  . amLODipine (NORVASC) 10 MG tablet Take 10 mg by mouth every morning.     . budesonide-formoterol (SYMBICORT) 160-4.5 MCG/ACT inhaler Inhale 2 puffs into the lungs 2 (two) times daily.    . cephALEXin (KEFLEX) 500 MG capsule Take 500 mg by mouth 4 (four) times daily.    . DULoxetine (CYMBALTA) 30 MG capsule Take 30 mg by mouth every morning.     . ferrous sulfate 324 (65 Fe) MG TBEC Take 324 mg by mouth daily.     Marland Kitchen gabapentin (NEURONTIN) 300 MG capsule Take 600 mg by mouth 3 (three) times daily.     . hydrochlorothiazide (HYDRODIURIL) 25 MG tablet Take 25 mg by mouth daily.     . insulin glargine (LANTUS) 100 UNIT/ML injection Inject into the  skin daily. Units based By sliding scale    . Ipratropium-Albuterol (COMBIVENT) 20-100 MCG/ACT AERS respimat Inhale into the lungs.    . lidocaine-prilocaine (EMLA) cream Apply 1 application topically as needed. 30-45 mins prior to port access. 30 g 0  . linaclotide (LINZESS) 145 MCG CAPS capsule Take 145 mcg by mouth daily before breakfast.    . lovastatin (MEVACOR) 20 MG tablet Take 20 mg by mouth every evening.    . metFORMIN (GLUCOPHAGE) 1000 MG tablet Take 1,000 mg by mouth 2 (two) times daily with a meal.    . metFORMIN (GLUCOPHAGE-XR) 500 MG 24 hr tablet Take 1,000 mg by mouth 2 (two) times daily.    . Multiple Vitamins-Minerals (MULTIVITAMIN WITH MINERALS) tablet Take 1 tablet by mouth daily.    Marland Kitchen omeprazole (PRILOSEC) 40 MG capsule Take 1 capsule (40 mg total) by mouth daily. (Patient taking differently:  Take 40 mg by mouth every morning. ) 90 capsule 1  . ondansetron (ZOFRAN) 8 MG tablet Take 1 tablet (8 mg total) by mouth every 8 (eight) hours as needed for nausea or vomiting (start 3 days; after chemo). 40 tablet 1  . polyethylene glycol powder (MIRALAX) 17 GM/SCOOP powder Mix full container in 64 ounces of Gatorade or other clear liquid. NO RED Liquids 238 g 0  . prazosin (MINIPRESS) 1 MG capsule Take 1 mg by mouth at bedtime.    . psyllium (METAMUCIL) 58.6 % powder Take 1 packet by mouth daily.    Marland Kitchen pyridOXINE (VITAMIN B-6) 100 MG tablet Take 100 mg by mouth daily.    . sitaGLIPtin (JANUVIA) 25 MG tablet Take 25 mg by mouth daily.    . vitamin B-12 (CYANOCOBALAMIN) 100 MCG tablet Take 100 mcg by mouth daily.     No current facility-administered medications for this visit.   Facility-Administered Medications Ordered in Other Visits  Medication Dose Route Frequency Provider Last Rate Last Admin  . 0.9 %  sodium chloride infusion   Intravenous Once Cammie Sickle, MD      . bevacizumab-bvzr (ZIRABEV) 300 mg in sodium chloride 0.9 % 100 mL chemo infusion  5 mg/kg (Treatment  Plan Recorded) Intravenous Once Cammie Sickle, MD      . dexamethasone (DECADRON) injection 4 mg  4 mg Intravenous Once Charlaine Dalton R, MD      . dextrose 5 % solution   Intravenous Once Charlaine Dalton R, MD      . fluorouracil (ADRUCIL) 4,200 mg in sodium chloride 0.9 % 66 mL chemo infusion  2,400 mg/m2 (Treatment Plan Recorded) Intravenous 1 day or 1 dose Charlaine Dalton R, MD      . leucovorin 700 mg in dextrose 5 % 250 mL infusion  400 mg/m2 (Treatment Plan Recorded) Intravenous Once Cammie Sickle, MD      . oxaliplatin (ELOXATIN) 150 mg in dextrose 5 % 500 mL chemo infusion  85 mg/m2 (Treatment Plan Recorded) Intravenous Once Charlaine Dalton R, MD      . palonosetron (ALOXI) injection 0.25 mg  0.25 mg Intravenous Once Cammie Sickle, MD          .  PHYSICAL EXAMINATION: ECOG PERFORMANCE STATUS: 0 - Asymptomatic  Vitals:   11/30/19 0853  BP: (!) 169/71  Pulse: 71  Temp: (!) 97 F (36.1 C)   Filed Weights   11/30/19 0853  Weight: 146 lb (66.2 kg)    Physical Exam  Constitutional: He is oriented to person, place, and time and well-developed, well-nourished, and in no distress.  HENT:  Head: Normocephalic and atraumatic.  Mouth/Throat: Oropharynx is clear and moist. No oropharyngeal exudate.  Eyes: Pupils are equal, round, and reactive to light.  Cardiovascular: Normal rate and regular rhythm.  Pulmonary/Chest: No respiratory distress. He has no wheezes.  Decreased air entry bilaterally.  Scattered wheezing present  Abdominal: Soft. Bowel sounds are normal. He exhibits no distension and no mass. There is no abdominal tenderness. There is no rebound and no guarding.  Abdominal incision well-healed.  Musculoskeletal:        General: No tenderness or edema. Normal range of motion.     Cervical back: Normal range of motion and neck supple.  Neurological: He is alert and oriented to person, place, and time.  Skin: Skin is warm.   Psychiatric: Affect normal.    LABORATORY DATA:  I have reviewed the data as listed Lab Results  Component Value Date   WBC 5.2 11/30/2019   HGB 11.8 (L) 11/30/2019   HCT 35.4 (L) 11/30/2019   MCV 79.9 (L) 11/30/2019   PLT 149 (L) 11/30/2019   Recent Labs    10/23/19 1022 11/16/19 0847 11/30/19 0840  NA 130* 132* 137  K 4.0 3.6 3.9  CL 93* 95* 100  CO2 _0 GLUCOSE 444* 422* 224*  BUN _1 CREATININE 1.15 1.12 1.08  CALCIUM 8.6* 9.0 9.1  GFRNONAA >60 >60 >60  GFRAA >60 >60 >60  PROT 6.5 7.0 6.9  ALBUMIN 3.5 3.8 3.6  AST 13* 20 18  ALT _2 ALKPHOS 78 81 63  BILITOT 0.3 0.4 0.5    RADIOGRAPHIC STUDIES: I have personally reviewed the radiological images as listed and agreed with the findings in the report. US BIOPSY (LIVER)  Result Date: 11/02/2019 INDICATION: History of small-cell lung carcinoma and adenocarcinoma of the cecum with enlarging lesion in the peripheral and superior aspect of the right lobe of the liver suspicious for metastatic disease. EXAM: ULTRASOUND GUIDED CORE BIOPSY OF LIVER MEDICATIONS: None. ANESTHESIA/SEDATION: Fentanyl 25 mcg IV; Versed 0.5 mg IV Moderate Sedation Time:  14 minutes. The patient was continuously monitored during the procedure by the interventional radiology nurse under my direct supervision. PROCEDURE: The procedure, risks, benefits, and alternatives were explained to the patient. Questions regarding the procedure were encouraged and answered. The patient understands and consents to the procedure. A time-out was performed prior to initiating the procedure. Initial detailed ultrasound was performed of the liver utilizing various ultrasound probes. The right abdominal wall was prepped with chlorhexidine in a sterile fashion, and a sterile drape was applied covering the operative field. A sterile gown and sterile gloves were used for the procedure. Local anesthesia was provided with 1% Lidocaine. Under ultrasound guidance, a  17 gauge trocar needle was advanced into the periphery of the right lobe of the liver. After confirming needle tip position, 2 separate coaxial 18 gauge core biopsy samples were obtained and submitted in formalin. Gel-Foam pledgets were advanced through the outer needle as the needle was retracted and removed. Additional ultrasound was performed. COMPLICATIONS: None immediate. FINDINGS: It was initially very difficult to localize the lesion measuring roughly 2-2.5 cm by prior imaging in the superior and lateral aspect of the right lobe of the liver. Vaguely marginated lesion was eventually identified by using a linear probe with limited window of visualization between a ribbed and the diaphragm. Solid tissue was obtained. IMPRESSION: Ultrasound-guided biopsy performed of a lesion within the superior and lateral aspect of the right lobe of the liver near the diaphragm. It was extremely difficult to visualize this lesion by ultrasound but it was felt that an abnormal area of tissue corresponding to the region of the lesion by prior MRI and CT was visible by ultrasound and this area was sampled. Electronically Signed   By: Aletta Edouard M.D.   On: 11/02/2019 13:05   DG Fluoro Guide CV Line Right  Result Date: 11/05/2019 CLINICAL DATA:  History of metastatic lung and colon carcinoma. Indwelling right-sided Port-A-Cath since August, 2020. After access today, there was scant blood return with aspiration and request has been made to perform a port injection for further evaluation. EXAM: CONTRAST INJECTION OF PORT A CATH UNDER FLUOROSCOPY CONTRAST:  7 mL Omnipaque 300 FLUOROSCOPY TIME:  24 seconds. 24 mGy. PROCEDURE: Contrast was administered via the indwelling port after it was accessed. Fluoroscopic spot images were  obtained of the catheter during injection. FINDINGS: Port reservoir is normally patent. Attached catheter tubing is intact and patent without evidence of contrast extravasation. The catheter tip is  positioned at the SVC/RA junction. Contrast does exit the catheter tip. There is slight eccentric streaming of the contrast exiting the catheter tip consistent with component of fibrin sheath. This does not appear severe but would account for some limitation in ability to aspirate blood. After the injection procedure and flushing with saline, there was ability to freely aspirate blood from the port reservoir. The port was flushed and the [facility] needle removed on completion of the injection procedure as the port is not scheduled to be used today. IMPRESSION: Component of fibrin sheath at the tip of the port catheter causing some mild eccentric streaming of contrast material but no significant occlusion. The port itself is intact without contrast extravasation. After the injection procedure today and flushing, there was ability to freely aspirate blood from the port. Electronically Signed   By: Aletta Edouard M.D.   On: 11/05/2019 12:29    ASSESSMENT & PLAN:   Cancer of ascending colon ([place]) #Right colon cancer stage IV; adenocarcinoma [s/p liver biopsy]-recommend NGS. Plan to start FOLFOX + avastin today.   # proceed withFOLFOX with Avastin chemotherapy today; Labs today reviewed;  acceptable for treatment today.   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  S/p concurrent radiation x4 cycles.  CT scan August 20, 2019-stable.  # Anemia- secondary- sec to chemo-hemoglobin on 12 stable  # Poorly controlled diabetes blood sugars-discussed with PCP; better controlled as per patients.  Will decrease dexamethasone to 6 mg.  # HTN- 160s; recommend control/monitoring esp on avastin.  Recommend getting a cuff at home.  # COPD- worse/wheezing- recommend continue inhalers.   # DISPOSITION: # chemo today;d-3- pump de-access # follow up in 2 weeks-MD; labs-cbc/cmp/cea; UA; FOLFOX chemo+ Avastin; D-3 pump de-access- Dr.B   All questions were answered. The patient knows to call the clinic with  any problems, questions or concerns.    Cammie Sickle, MD 11/30/2019 9:54 AM

## 2019-11-30 NOTE — Progress Notes (Signed)
Spoke to pt- re: right foot infection; hold oxaliplatin; bev. Ok to proceed with LV/5FU pump.   Needs referral to wound care/ Hx of poorly controlled diabetes; foot ulcer  Needs x-ray of right foot- ordered; remind the pt again.

## 2019-12-01 ENCOUNTER — Ambulatory Visit
Admission: RE | Admit: 2019-12-01 | Discharge: 2019-12-01 | Disposition: A | Discharge status: Home / Self Care | Payer: BC Managed Care – PPO | Source: Ambulatory Visit | Attending: Internal Medicine | Admitting: Internal Medicine

## 2019-12-01 DIAGNOSIS — L97511 Non-pressure chronic ulcer of other part of right foot limited to breakdown of skin: Secondary | ICD-10-CM | POA: Diagnosis not present

## 2019-12-02 ENCOUNTER — Inpatient Hospital Stay: Payer: BC Managed Care – PPO

## 2019-12-02 ENCOUNTER — Other Ambulatory Visit: Payer: Self-pay

## 2019-12-02 VITALS — BP 136/62 | HR 70 | Temp 97.6°F | Resp 18

## 2019-12-02 DIAGNOSIS — Z5112 Encounter for antineoplastic immunotherapy: Secondary | ICD-10-CM | POA: Diagnosis not present

## 2019-12-02 DIAGNOSIS — Z7189 Other specified counseling: Secondary | ICD-10-CM

## 2019-12-02 DIAGNOSIS — C182 Malignant neoplasm of ascending colon: Secondary | ICD-10-CM

## 2019-12-02 MED ORDER — HEPARIN SOD (PORK) LOCK FLUSH 100 UNIT/ML IV SOLN
500.0000 [IU] | Freq: Once | INTRAVENOUS | Status: AC | PRN
  Administered 2019-12-02: 500 [IU]
  Filled 2019-12-02: qty 5

## 2019-12-02 MED ORDER — SODIUM CHLORIDE 0.9% FLUSH
10.0000 mL | INTRAVENOUS | Status: DC | PRN
  Administered 2019-12-02: 10 mL
  Filled 2019-12-02: qty 10

## 2019-12-07 ENCOUNTER — Telehealth: Payer: Self-pay | Admitting: Internal Medicine

## 2019-12-07 NOTE — Telephone Encounter (Signed)
On 6/14-spoke to patient regarding the results of the x-rays of the foot-no concerns for osteomyelitis.  Patient awaiting appointment with wound clinic tomorrow.  Recommend close monitoring of the blood sugars.

## 2019-12-08 ENCOUNTER — Encounter: Payer: BC Managed Care – PPO | Attending: Physician Assistant | Admitting: Physician Assistant

## 2019-12-08 ENCOUNTER — Other Ambulatory Visit: Payer: Self-pay

## 2019-12-08 DIAGNOSIS — Z923 Personal history of irradiation: Secondary | ICD-10-CM | POA: Insufficient documentation

## 2019-12-08 DIAGNOSIS — Z885 Allergy status to narcotic agent status: Secondary | ICD-10-CM | POA: Insufficient documentation

## 2019-12-08 DIAGNOSIS — C189 Malignant neoplasm of colon, unspecified: Secondary | ICD-10-CM | POA: Insufficient documentation

## 2019-12-08 DIAGNOSIS — X58XXXA Exposure to other specified factors, initial encounter: Secondary | ICD-10-CM | POA: Insufficient documentation

## 2019-12-08 DIAGNOSIS — H5440 Blindness, one eye, unspecified eye: Secondary | ICD-10-CM | POA: Insufficient documentation

## 2019-12-08 DIAGNOSIS — E11621 Type 2 diabetes mellitus with foot ulcer: Secondary | ICD-10-CM | POA: Insufficient documentation

## 2019-12-08 DIAGNOSIS — F17200 Nicotine dependence, unspecified, uncomplicated: Secondary | ICD-10-CM | POA: Insufficient documentation

## 2019-12-08 DIAGNOSIS — J449 Chronic obstructive pulmonary disease, unspecified: Secondary | ICD-10-CM | POA: Insufficient documentation

## 2019-12-08 DIAGNOSIS — Z880 Allergy status to penicillin: Secondary | ICD-10-CM | POA: Diagnosis not present

## 2019-12-08 DIAGNOSIS — M1990 Unspecified osteoarthritis, unspecified site: Secondary | ICD-10-CM | POA: Diagnosis not present

## 2019-12-08 DIAGNOSIS — C229 Malignant neoplasm of liver, not specified as primary or secondary: Secondary | ICD-10-CM | POA: Insufficient documentation

## 2019-12-08 DIAGNOSIS — S91302A Unspecified open wound, left foot, initial encounter: Secondary | ICD-10-CM | POA: Diagnosis not present

## 2019-12-08 DIAGNOSIS — L97522 Non-pressure chronic ulcer of other part of left foot with fat layer exposed: Secondary | ICD-10-CM | POA: Diagnosis not present

## 2019-12-08 DIAGNOSIS — E1140 Type 2 diabetes mellitus with diabetic neuropathy, unspecified: Secondary | ICD-10-CM | POA: Insufficient documentation

## 2019-12-08 DIAGNOSIS — C3490 Malignant neoplasm of unspecified part of unspecified bronchus or lung: Secondary | ICD-10-CM | POA: Diagnosis not present

## 2019-12-09 ENCOUNTER — Other Ambulatory Visit: Payer: Self-pay | Admitting: Family

## 2019-12-09 DIAGNOSIS — L97509 Non-pressure chronic ulcer of other part of unspecified foot with unspecified severity: Secondary | ICD-10-CM

## 2019-12-09 DIAGNOSIS — E11621 Type 2 diabetes mellitus with foot ulcer: Secondary | ICD-10-CM

## 2019-12-09 NOTE — Progress Notes (Signed)
Kevin Mckay ([id number]) Visit Report for 12/08/2019 Chief Complaint Document Details Patient Name: Kevin Mckay Date of Service: 12/08/2019 2:15 PM Medical Record Number: [id number] Patient Account Number: [id number] Date of Birth/Sex: 1950-08-22 (68 y.o. M) Treating RN: Montey Hora Primary Care Provider: Laneta Simmers Other Clinician: Referring Provider: Charlaine Dalton Treating Provider/Extender: Melburn Hake, Jamaia Brum Weeks in Treatment: 0 Information Obtained from: Patient Chief Complaint Left 2nd toe ulcer Electronic Signature(s) Signed: 12/08/2019 2:47:59 PM By: Worthy Keeler PA-C Entered By: Worthy Keeler on 12/08/2019 14:47:59 Kevin Mckay ([id number]) -------------------------------------------------------------------------------- HPI Details Patient Name: Kevin Mckay Date of Service: 12/08/2019 2:15 PM Medical Record Number: [id number] Patient Account Number: [id number] Date of Birth/Sex: October 13, 1950 (68 y.o. M) Treating RN: Montey Hora Primary Care Provider: Laneta Simmers Other Clinician: Referring Provider: Charlaine Dalton Treating Provider/Extender: Melburn Hake, Arletta Lumadue Weeks in Treatment: 0 History of Present Illness HPI Description: 12/08/2019 upon evaluation today patient presents for initial evaluation here in our clinic concerning issues he has been having with wounds on his feet and in fact the last thing remaining at this time is a small wound on the left dorsal second toe. He does have diabetes he also does have lung, liver, and colon cancer at this point. He is a current smoker and states he really has no desire nor motivation to stop. He knows what this can do to his health but again at this point he states that that is not really his biggest concern to try to quit. With that being said the patient otherwise seems to be doing quite well visually where I am not really seeing a tremendous amount of evidence of him being very sickly  which is good news. Also think he seems to be healing nicely as far as his wounds are concerned. His blood sugars have been out of control in the 200s to high 400s range just depending on the day he tells me. With that being said his doctor is try to work on getting this down. Electronic Signature(s) Signed: 12/08/2019 5:21:40 PM By: Worthy Keeler PA-C Entered By: Worthy Keeler on 12/08/2019 17:21:40 Kevin Mckay ([id number]) -------------------------------------------------------------------------------- Physical Exam Details Patient Name: Kevin Mckay Date of Service: 12/08/2019 2:15 PM Medical Record Number: [id number] Patient Account Number: [id number] Date of Birth/Sex: Aug 12, 1950 (68 y.o. M) Treating RN: Montey Hora Primary Care Provider: Laneta Simmers Other Clinician: Referring Provider: Charlaine Dalton Treating Provider/Extender: STONE III, Koren Plyler Weeks in Treatment: 0 Constitutional patient is hypertensive.. supine blood pressure is within target range for patient.. pulse regular and within target range for patient.Marland Kitchen respirations regular, non-labored and within target range for patient.Marland Kitchen temperature within target range for patient.. Well-nourished and well-hydrated in no acute distress. Eyes conjunctiva clear no eyelid edema noted. pupils equal round and reactive to light and accommodation. Ears, Nose, Mouth, and Throat no gross abnormality of ear auricles or external auditory canals. normal hearing noted during conversation. mucus membranes moist. Respiratory normal breathing without difficulty. Cardiovascular no clubbing, cyanosis, significant edema, <3 sec cap refill. Musculoskeletal normal gait and posture. no significant deformity or arthritic changes, no loss or range of motion, no clubbing. Psychiatric this patient is able to make decisions and demonstrates good insight into disease process. Alert and Oriented x 3. pleasant and  cooperative. Notes Upon inspection patient's wound bed actually showed signs of a very small wound on the dorsal surface of the second toe upon evaluation today. The patient actually seems to be doing quite well and there is no evidence of active infection which is  great news. Overall I feel like that I really do not believe we need to do a lot of aggressive wound care intervention he is done excellent with using the over-the-counter antibiotic ointment which again I think has done a great job keeping his infection down as well as keeping the area nice to moist to allow to heal. In fact I would recommend that we probably continue as such with this for the time being. Electronic Signature(s) Signed: 12/08/2019 5:22:20 PM By: Worthy Keeler PA-C Entered By: Worthy Keeler on 12/08/2019 17:22:20 Kevin Mckay ([id number]) -------------------------------------------------------------------------------- Physician Orders Details Patient Name: Kevin Mckay Date of Service: 12/08/2019 2:15 PM Medical Record Number: [id number] Patient Account Number: [id number] Date of Birth/Sex: [birth year] (68 y.o. M) Treating RN: Montey Hora Primary Care Provider: Laneta Simmers Other Clinician: Referring Provider: Charlaine Dalton Treating Provider/Extender: Melburn Hake, Clodagh Odenthal Weeks in Treatment: 0 Verbal / Phone Orders: No Diagnosis Coding ICD-10 Coding Code Description E11.621 Type 2 diabetes mellitus with foot ulcer S91.302A Unspecified open wound, left foot, initial encounter L97.522 Non-pressure chronic ulcer of other part of left foot with fat layer exposed C22.9 Malignant neoplasm of liver, not specified as primary or secondary C18.9 Malignant neoplasm of colon, unspecified C34.90 Malignant neoplasm of unspecified part of unspecified bronchus or lung Wound Cleansing Wound #1 Left,Dorsal Toe Second o Clean wound with Normal Saline. o Dial antibacterial soap, wash wounds, rinse and  pat dry prior to dressing wounds o May Shower, gently pat wound dry prior to applying new dressing. - Please do not soak your foot Primary Wound Dressing Wound #1 Left,Dorsal Toe Second o Other: - tripe antibiotic ointment (such as neosporin) Secondary Dressing Wound #1 Left,Dorsal Toe Second o Other - coverlet or bandaid Dressing Change Frequency Wound #1 Left,Dorsal Toe Second o Change dressing every day. Follow-up Appointments o Return Appointment in 2 weeks. Additional Orders / Instructions o Other: - Please wear shoes at all times Electronic Signature(s) Signed: 12/08/2019 4:29:48 PM By: Montey Hora Signed: 12/08/2019 6:10:04 PM By: Worthy Keeler PA-C Entered By: Montey Hora on 12/08/2019 15:01:16 Kevin Mckay ([id number]) -------------------------------------------------------------------------------- Problem List Details Patient Name: Kevin Mckay Date of Service: 12/08/2019 2:15 PM Medical Record Number: [id number] Patient Account Number: [id number] Date of Birth/Sex: 03-30-1951 (68 y.o. M) Treating RN: Montey Hora Primary Care Provider: Laneta Simmers Other Clinician: Referring Provider: Charlaine Dalton Treating Provider/Extender: Melburn Hake, Nakiya Rallis Weeks in Treatment: 0 Active Problems ICD-10 Encounter Code Description Active Date MDM Diagnosis E11.621 Type 2 diabetes mellitus with foot ulcer 12/08/2019 No Yes S91.302A Unspecified open wound, left foot, initial encounter 12/08/2019 No Yes L97.522 Non-pressure chronic ulcer of other part of left foot with fat layer 12/08/2019 No Yes exposed C22.9 Malignant neoplasm of liver, not specified as primary or secondary 12/08/2019 No Yes C18.9 Malignant neoplasm of colon, unspecified 12/08/2019 No Yes C34.90 Malignant neoplasm of unspecified part of unspecified bronchus or lung 12/08/2019 No Yes Inactive Problems Resolved Problems Electronic Signature(s) Signed: 12/08/2019 2:47:40 PM By: Worthy Keeler PA-C Entered By: Worthy Keeler on 12/08/2019 14:47:40 Kevin Mckay ([id number]) -------------------------------------------------------------------------------- Progress Note Details Patient Name: Kevin Mckay Date of Service: 12/08/2019 2:15 PM Medical Record Number: [id number] Patient Account Number: [id number] Date of Birth/Sex: Jun 06, 1951 (68 y.o. M) Treating RN: Montey Hora Primary Care Provider: Laneta Simmers Other Clinician: Referring Provider: Charlaine Dalton Treating Provider/Extender: Melburn Hake, Maahi Lannan Weeks in Treatment: 0 Subjective Chief Complaint Information obtained from Patient Left 2nd toe ulcer History of Present Illness (HPI) 12/08/2019 upon evaluation today patient presents  for initial evaluation here in our clinic concerning issues he has been having with wounds on his feet and in fact the last thing remaining at this time is a small wound on the left dorsal second toe. He does have diabetes he also does have lung, liver, and colon cancer at this point. He is a current smoker and states he really has no desire nor motivation to stop. He knows what this can do to his health but again at this point he states that that is not really his biggest concern to try to quit. With that being said the patient otherwise seems to be doing quite well visually where I am not really seeing a tremendous amount of evidence of him being very sickly which is good news. Also think he seems to be healing nicely as far as his wounds are concerned. His blood sugars have been out of control in the 200s to high 400s range just depending on the day he tells me. With that being said his doctor is try to work on getting this down. Patient History Information obtained from Patient. Allergies amoxicillin, codeine, ferumoxytol (Reaction: SOB) Family History Cancer - Maternal Grandparents,Mother,Father. Social History Current every day smoker, Alcohol Use - Never, Drug  Use - No History, Caffeine Use - Daily. Medical History Eyes Patient has history of Cataracts Respiratory Patient has history of Chronic Obstructive Pulmonary Disease (COPD) Endocrine Patient has history of Type II Diabetes Musculoskeletal Patient has history of Osteoarthritis Oncologic Patient has history of Received Chemotherapy - cancer lung, colon, liver, Received Radiation - cancer lung Patient is treated with Insulin, Oral Agents. Blood sugar is tested. Blood sugar results noted at the following times: Breakfast - 279. 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 Denies complaints or symptoms of Dry Eyes, Glasses / Contacts. Ear/Nose/Mouth/Throat Denies complaints or symptoms of Difficult clearing ears, Sinusitis. Hematologic/Lymphatic Denies complaints or symptoms of Bleeding / Clotting Disorders, Human Immunodeficiency Virus. Respiratory Complains or has symptoms of Shortness of Breath. Denies complaints or symptoms of Chronic or frequent coughs. Cardiovascular Denies complaints or symptoms of Chest pain, LE edema. Gastrointestinal Denies complaints or symptoms of Frequent diarrhea, Nausea, Vomiting. Endocrine Denies complaints or symptoms of Hepatitis, Thyroid disease, Polydypsia (Excessive Thirst). Genitourinary Kevin Mckay ([id number]) Denies complaints or symptoms of Kidney failure/ Dialysis, Incontinence/dribbling. Immunological Denies complaints or symptoms of Hives, Itching. Integumentary (Skin) Complains or has symptoms of Wounds - left 2nd toe. Musculoskeletal Denies complaints or symptoms of Muscle Pain, Muscle Weakness. Neurologic Denies complaints or symptoms of Numbness/parasthesias, Focal/Weakness. Psychiatric Denies  complaints or symptoms of Anxiety, Claustrophobia. Objective Constitutional patient is hypertensive.. supine blood pressure is within target range for patient.. pulse regular and within target range for patient.Marland Kitchen respirations regular, non-labored and within target range for patient.Marland Kitchen temperature within target range for patient.. Well-nourished and well-hydrated in no acute distress. Vitals Time Taken: 2:05 PM, Height: 68 in, Source: Stated, Weight: 140 lbs, Source: Measured, BMI: 21.3, Temperature: 98.2 F, Pulse: 66 bpm, Respiratory Rate: 16 breaths/min, Blood Pressure: 172/63 mmHg. Eyes conjunctiva clear no eyelid edema noted. pupils equal round and reactive to light and accommodation. Ears, Nose, Mouth, and Throat no gross abnormality of ear auricles or external auditory canals. normal hearing noted during conversation. mucus membranes moist. Respiratory normal breathing without difficulty. Cardiovascular no clubbing,  cyanosis, significant edema, Musculoskeletal normal gait and posture. no significant deformity or arthritic changes, no loss or range of motion, no clubbing. Psychiatric this patient is able to make decisions and demonstrates good insight into disease process. Alert and Oriented x 3. pleasant and cooperative. General Notes: Upon inspection patient's wound bed actually showed signs of a very small wound on the dorsal surface of the second toe upon evaluation today. The patient actually seems to be doing quite well and there is no evidence of active infection which is great news. Overall I feel like that I really do not believe we need to do a lot of aggressive wound care intervention he is done excellent with using the over-the-counter antibiotic ointment which again I think has done a great job keeping his infection down as well as keeping the area nice to moist to allow to heal. In fact I would recommend that we probably continue as such with this for the time  being. Integumentary (Hair, Skin) Wound #1 status is Open. Original cause of wound was Trauma. The wound is located on the Left,Dorsal Toe Second. The wound measures 0.6cm length x 0.7cm width x 0.1cm depth; 0.33cm^2 area and 0.033cm^3 volume. There is Fat Layer (Subcutaneous Tissue) Exposed exposed. There is no tunneling or undermining noted. There is a medium amount of serous drainage noted. The wound margin is flat and intact. There is large (67-100%) pink granulation within the wound bed. There is a small (1-33%) amount of necrotic tissue within the wound bed including Adherent Slough. Assessment Active Problems ICD-10 Type 2 diabetes mellitus with foot ulcer Unspecified open wound, left foot, initial encounter Non-pressure chronic ulcer of other part of left foot with fat layer exposed Malignant neoplasm of liver, not specified as primary or secondary Malignant neoplasm of colon, unspecified Malignant neoplasm of unspecified part of unspecified bronchus or lung Kevin Mckay, Kevin Mckay ([id number]) Plan Wound Cleansing: Wound #1 Left,Dorsal Toe Second: Clean wound with Normal Saline. Dial antibacterial soap, wash wounds, rinse and pat dry prior to dressing wounds May Shower, gently pat wound dry prior to applying new dressing. - Please do not soak your foot Primary Wound Dressing: Wound #1 Left,Dorsal Toe Second: Other: - tripe antibiotic ointment (such as neosporin) Secondary Dressing: Wound #1 Left,Dorsal Toe Second: Other - coverlet or bandaid Dressing Change Frequency: Wound #1 Left,Dorsal Toe Second: Change dressing every day. Follow-up Appointments: Return Appointment in 2 weeks. Additional Orders / Instructions: Other: - Please wear shoes at all times 1. I would recommend currently based on what I am seeing at this point that we continue with a an antibiotic ointment to the second toe and then subsequently we will use use a Band-Aid over this just to keep it nice and moist it  seems to be doing well again this is very close to complete resolution. 2. I am also can recommend at this time that we continue with having the patient try and wear shoes at all times especially when he is outside but even inside to be perfectly honest. He really needs to have these on to protect his feet in order to prevent any further damage being that he is diabetic and does have neuropathy he has an increased risk in general of complications. He voiced an understanding in this regard and states he is good to try to do better as well. We will see patient back for reevaluation in 2 weeks here in the clinic. If anything worsens or changes patient will contact our office for additional  recommendations. Electronic Signature(s) Signed: 12/08/2019 5:26:52 PM By: Worthy Keeler PA-C Entered By: Worthy Keeler on 12/08/2019 17:26:52 Kevin Mckay ([id number]) -------------------------------------------------------------------------------- ROS/PFSH Details Patient Name: Kevin Mckay Date of Service: 12/08/2019 2:15 PM Medical Record Number: [id number] Patient Account Number: [id number] Date of Birth/Sex: [birth year] (68 y.o. 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 Negative for: Dry Eyes; Glasses / Contacts Medical History: Positive for: Cataracts Ear/Nose/Mouth/Throat Complaints and Symptoms: Negative for: Difficult clearing ears; Sinusitis Hematologic/Lymphatic Complaints and Symptoms: Negative for: Bleeding / Clotting Disorders; Human Immunodeficiency Virus Respiratory Complaints and Symptoms: Positive for: Shortness of Breath Negative for: Chronic or frequent coughs Medical History: Positive for: Chronic  Obstructive Pulmonary Disease (COPD) Past Medical History Notes: Lung cancer Cardiovascular Complaints and Symptoms: Negative for: Chest pain; LE edema Gastrointestinal Complaints and Symptoms: Negative for: Frequent diarrhea; Nausea; Vomiting Endocrine Complaints and Symptoms: Negative for: Hepatitis; Thyroid disease; Polydypsia (Excessive Thirst) Medical History: Positive for: Type II Diabetes Time with diabetes: 6 years Treated with: Insulin, Oral agents Blood sugar tested every day: Yes Tested : 3 Blood sugar testing results: Breakfast: 279 Genitourinary Kevin Mckay, Kevin Mckay ([id number]) Complaints and Symptoms: Negative for: Kidney failure/ Dialysis; Incontinence/dribbling Immunological Complaints and Symptoms: Negative for: Hives; Itching Integumentary (Skin) Complaints and Symptoms: Positive for: Wounds - left 2nd toe Musculoskeletal Complaints and Symptoms: Negative for: Muscle Pain; Muscle Weakness Medical History: Positive for: Osteoarthritis Neurologic Complaints and Symptoms: Negative for: Numbness/parasthesias; Focal/Weakness Psychiatric Complaints and Symptoms: Negative for: Anxiety; Claustrophobia Constitutional Symptoms (General Health) Medical History: Past Medical History Notes: Lung, Liver and Colon cancer (chemo every 2 weeks, radiation competed 6 mos.) Oncologic Medical History: Positive for: Received Chemotherapy - cancer lung, colon, liver; Received Radiation - cancer lung HBO Extended History Items Eyes: Cataracts Immunizations Pneumococcal Vaccine: Received Pneumococcal Vaccination: Yes Implantable Devices None Family and Social History Cancer: Yes - Maternal Grandparents,Mother,Father; Current every day smoker; Alcohol Use: Never; Drug Use: No History; Caffeine Use: Daily Electronic Signature(s) Signed: 12/08/2019 6:10:04 PM By: Worthy Keeler PA-C Signed: 12/09/2019 5:04:05 PM By: Gretta Cool, BSN, RN, CWS, Kim RN, BSN Entered By: Gretta Cool,  BSN, RN, CWS, Kim on 12/08/2019 14:23:21 Kevin Mckay ([id number]) -------------------------------------------------------------------------------- SuperBill Details Patient Name: Kevin Mckay Date of Service: 12/08/2019 Medical Record Number: [id number] Patient Account Number: [id number] Date of Birth/Sex: Jan 26, 1951 (68 y.o. M) Treating RN: Montey Hora Primary Care Provider: Laneta Simmers Other Clinician: Referring Provider: Charlaine Dalton Treating Provider/Extender: Melburn Hake, Berta Denson Weeks in Treatment: 0 Diagnosis Coding ICD-10 Codes Code Description E11.621 Type 2 diabetes mellitus with foot ulcer S91.302A Unspecified open wound, left foot, initial encounter L97.522 Non-pressure chronic ulcer of other part of left foot with fat layer exposed C22.9 Malignant neoplasm of liver, not specified as primary or secondary C18.9 Malignant neoplasm of colon, unspecified C34.90 Malignant neoplasm of unspecified part of unspecified bronchus or lung Facility Procedures CPT4 Code: [id number] Description: 99214 - WOUND CARE VISIT-LEV 4 EST PT Modifier: Quantity: 1 Physician Procedures CPT4 Code: [id number] Description: WC PHYS LEVEL 3 o NEW PT Modifier: Quantity: 1 CPT4 Code: Description: ICD-10 Diagnosis Description E11.621 Type 2 diabetes mellitus with foot ulcer S91.302A Unspecified open wound, left foot, initial encounter L97.522 Non-pressure chronic ulcer of other part of left foot with fat layer C22.9 Malignant neoplasm  of liver, not specified as primary or secondary Modifier: exposed Quantity: Electronic  Signature(s) Signed: 12/08/2019 5:27:20 PM By: Worthy Keeler PA-C Entered By: Worthy Keeler on 12/08/2019 17:27:20

## 2019-12-09 NOTE — Progress Notes (Addendum)
FAISAL, STRADLING ([id number]) Visit Report for 12/08/2019 Allergy List Details Patient Name: Kevin Mckay, Kevin Mckay Date of Service: 12/08/2019 2:15 PM Medical Record Number: [id number] Patient Account Number: [id number] Date of Birth/Sex: 05-Feb-1951 (68 y.o. M) Treating RN: Cornell Barman Primary Care Keslee Harrington: Laneta Simmers Other Clinician: Referring Kamariyah Timberlake: Charlaine Dalton Treating Keymon Mcelroy/Extender: Melburn Hake, HOYT Weeks in Treatment: 0 Allergies Active Allergies amoxicillin codeine ferumoxytol Reaction: SOB Allergy Notes Electronic Signature(s) Signed: 12/09/2019 5:04:05 PM By: Gretta Cool, BSN, RN, CWS, Kim RN, BSN Entered By: Gretta Cool, BSN, RN, CWS, Kim on 12/08/2019 14:17:33 Kevin Mckay ([id number]) -------------------------------------------------------------------------------- Arrival Information Details Patient Name: Kevin Mckay Date of Service: 12/08/2019 2:15 PM Medical Record Number: [id number] Patient Account Number: [id number] Date of Birth/Sex: [birth year] (68 y.o. M) Treating RN: Cornell Barman Primary Care Olman Yono: Laneta Simmers Other Clinician: Referring Ellora Varnum: Charlaine Dalton Treating Joseline Mccampbell/Extender: Melburn Hake, HOYT Weeks in Treatment: 0 Visit Information Patient Arrived: Ambulatory Arrival Time: 14:06 Accompanied By: wife Transfer Assistance: None Patient Identification Verified: Yes Secondary Verification Process Completed: Yes Electronic Signature(s) Signed: 12/09/2019 5:04:05 PM By: Gretta Cool, BSN, RN, CWS, Kim RN, BSN Entered By: Gretta Cool, BSN, RN, CWS, Kim on 12/08/2019 14:08:11 Kevin Mckay ([id number]) -------------------------------------------------------------------------------- Clinic Level of Care Assessment Details Patient Name: Kevin Mckay Date of Service: 12/08/2019 2:15 PM Medical Record Number: [id number] Patient Account Number: [id number] Date of Birth/Sex: 1951/06/22 (68 y.o. M) Treating RN: Montey Hora Primary Care  Daeton Kluth: Laneta Simmers Other Clinician: Referring Vannary Greening: Charlaine Dalton Treating Kesleigh Morson/Extender: Melburn Hake, HOYT Weeks in Treatment: 0 Clinic Level of Care Assessment Items TOOL 2 Quantity Score []  - Use when only an EandM is performed on the INITIAL visit 0 ASSESSMENTS - Nursing Assessment / Reassessment X - General Physical Exam (combine w/ comprehensive assessment (listed just below) when performed on new 1 20 pt. evals) X- 1 25 Comprehensive Assessment (HX, ROS, Risk Assessments, Wounds Hx, etc.) ASSESSMENTS - Wound and Skin Assessment / Reassessment X - Simple Wound Assessment / Reassessment - one wound 1 5 []  - 0 Complex Wound Assessment / Reassessment - multiple wounds X- 1 10 Dermatologic / Skin Assessment (not related to wound area) ASSESSMENTS - Ostomy and/or Continence Assessment and Care []  - Incontinence Assessment and Management 0 []  - 0 Ostomy Care Assessment and Management (repouching, etc.) PROCESS - Coordination of Care X - Simple Patient / Family Education for ongoing care 1 15 []  - 0 Complex (extensive) Patient / Family Education for ongoing care X- 1 10 Staff obtains Programmer, systems, Records, Test Results / Process Orders []  - 0 Staff telephones HHA, Nursing Homes / Clarify orders / etc []  - 0 Routine Transfer to another Facility (non-emergent condition) []  - 0 Routine Hospital Admission (non-emergent condition) X- 1 15 New Admissions / Biomedical engineer / Ordering NPWT, Apligraf, etc. []  - 0 Emergency Hospital Admission (emergent condition) X- 1 10 Simple Discharge Coordination []  - 0 Complex (extensive) Discharge Coordination PROCESS - Special Needs []  - Pediatric / Minor Patient Management 0 []  - 0 Isolation Patient Management []  - 0 Hearing / Language / Visual special needs []  - 0 Assessment of Community assistance (transportation, D/C planning, etc.) []  - 0 Additional assistance / Altered mentation []  - 0 Support  Surface(s) Assessment (bed, cushion, seat, etc.) INTERVENTIONS - Wound Cleansing / Measurement X - Wound Imaging (photographs - any number of wounds) 1 5 []  - 0 Wound Tracing (instead of photographs) X- 1 5 Simple Wound Measurement - one wound []  - 0 Complex Wound Measurement - multiple wounds Sussman, Shana ([id number]) X- 1 5  Simple Wound Cleansing - one wound []  - 0 Complex Wound Cleansing - multiple wounds INTERVENTIONS - Wound Dressings X - Small Wound Dressing one or multiple wounds 1 10 []  - 0 Medium Wound Dressing one or multiple wounds []  - 0 Large Wound Dressing one or multiple wounds []  - 0 Application of Medications - injection INTERVENTIONS - Miscellaneous []  - External ear exam 0 []  - 0 Specimen Collection (cultures, biopsies, blood, body fluids, etc.) []  - 0 Specimen(s) / Culture(s) sent or taken to Lab for analysis []  - 0 Patient Transfer (multiple staff / Civil Service fast streamer / Similar devices) []  - 0 Simple Staple / Suture removal (25 or less) []  - 0 Complex Staple / Suture removal (26 or more) []  - 0 Hypo / Hyperglycemic Management (close monitor of Blood Glucose) []  - 0 Ankle / Brachial Index (ABI) - do not check if billed separately Has the patient been seen at the hospital within the last three years: Yes Total Score: 135 Level Of Care: New/Established - Level 4 Electronic Signature(s) Signed: 12/08/2019 4:29:48 PM By: Montey Hora Entered By: Montey Hora on 12/08/2019 15:21:00 Kevin Mckay ([id number]) -------------------------------------------------------------------------------- Encounter Discharge Information Details Patient Name: Kevin Mckay Date of Service: 12/08/2019 2:15 PM Medical Record Number: [id number] Patient Account Number: [id number] Date of Birth/Sex: 06-Dec-1950 (68 y.o. M) Treating RN: Montey Hora Primary Care Esa Raden: Laneta Simmers Other Clinician: Referring Ishitha Roper: Charlaine Dalton Treating  Kesi Perrow/Extender: Melburn Hake, HOYT Weeks in Treatment: 0 Encounter Discharge Information Items Discharge Condition: Stable Ambulatory Status: Ambulatory Discharge Destination: Home Transportation: Private Auto Accompanied By: wife Schedule Follow-up Appointment: Yes Clinical Summary of Care: Electronic Signature(s) Signed: 12/08/2019 4:29:48 PM By: Montey Hora Entered By: Montey Hora on 12/08/2019 15:22:02 Kevin Mckay ([id number]) -------------------------------------------------------------------------------- Lower Extremity Assessment Details Patient Name: Kevin Mckay Date of Service: 12/08/2019 2:15 PM Medical Record Number: [id number] Patient Account Number: [id number] Date of Birth/Sex: 06-Mar-1951 (68 y.o. M) Treating RN: Cornell Barman Primary Care Alaiya Martindelcampo: Laneta Simmers Other Clinician: Referring Khadijatou Borak: Charlaine Dalton Treating Nobel Brar/Extender: Melburn Hake, HOYT Weeks in Treatment: 0 Edema Assessment Assessed: [Left: No] [Right: No] Edema: [Left: No] [Right: No] Vascular Assessment Pulses: Dorsalis Pedis Palpable: [Left:Yes] [Right:Yes] Posterior Tibial Palpable: [Left:Yes] [Right:Yes] Electronic Signature(s) Signed: 12/09/2019 5:04:05 PM By: Gretta Cool, BSN, RN, CWS, Kim RN, BSN Entered By: Gretta Cool, BSN, RN, CWS, Kim on 12/08/2019 14:15:13 Kevin Mckay ([id number]) -------------------------------------------------------------------------------- Multi Wound Chart Details Patient Name: Kevin Mckay Date of Service: 12/08/2019 2:15 PM Medical Record Number: [id number] Patient Account Number: [id number] Date of Birth/Sex: Jul 15, 1950 (68 y.o. M) Treating RN: Montey Hora Primary Care Yomira Flitton: Laneta Simmers Other Clinician: Referring Toshiko Kemler: Charlaine Dalton Treating Moriya Mitchell/Extender: STONE III, HOYT Weeks in Treatment: 0 Vital Signs Height(in): 68 Pulse(bpm): 80 Weight(lbs): 140 Blood Pressure(mmHg): 172/63 Body Mass Index(BMI):  21 Temperature(F): 98.2 Respiratory Rate(breaths/min): 16 Photos: [N/A:N/A] Wound Location: Left, Dorsal Toe Second N/A N/A Wounding Event: Trauma N/A N/A Primary Etiology: Diabetic Wound/Ulcer of the Lower N/A N/A Extremity Secondary Etiology: Trauma, Other N/A N/A Comorbid History: Cataracts, Chronic Obstructive N/A N/A Pulmonary Disease (COPD), Type II Diabetes, Osteoarthritis, Received Chemotherapy, Received Radiation Date Acquired: 11/24/2019 N/A N/A Weeks of Treatment: 0 N/A N/A Wound Status: Open N/A N/A Measurements L x W x D (cm) 0.6x0.7x0.1 N/A N/A Area (cm) : 0.33 N/A N/A Volume (cm) : 0.033 N/A N/A % Reduction in Area: 0.00% N/A N/A % Reduction in Volume: 0.00% N/A N/A Classification: Grade 1 N/A N/A Exudate Amount: Medium N/A N/A Exudate Type: Serous N/A N/A Exudate Color: amber N/A N/A Wound  Margin: Flat and Intact N/A N/A Granulation Amount: Large (67-100%) N/A N/A Granulation Quality: Pink N/A N/A Necrotic Amount: Small (1-33%) N/A N/A Exposed Structures: Fat Layer (Subcutaneous Tissue) N/A N/A Exposed: Yes Fascia: No Tendon: No Muscle: No Joint: No Bone: No Epithelialization: None N/A N/A Treatment Notes Electronic Signature(s) Signed: 12/08/2019 4:29:48 PM By: Montey Hora Entered By: Montey Hora on 12/08/2019 14:54:29 Kevin Mckay ([id number] Mckay ([id number]) -------------------------------------------------------------------------------- Multi-Disciplinary Care Plan Details Patient Name: Kevin Mckay Date of Service: 12/08/2019 2:15 PM Medical Record Number: [id number] Patient Account Number: [id number] Date of Birth/Sex: 11-28-1950 (69 y.o. M) Treating RN: Cornell Barman Primary Care Channing Savich: Laneta Simmers Other Clinician: Referring Naliah Eddington: Charlaine Dalton Treating Jamarques Pinedo/Extender: Melburn Hake, HOYT Weeks in Treatment: 0 Active Inactive Electronic Signature(s) Signed: 01/04/2020 5:22:14 PM By: Gretta Cool, BSN, RN,  CWS, Kim RN, BSN Previous Signature: 12/08/2019 2:40:46 PM Version By: Gretta Cool BSN, RN, CWS, Kim RN, BSN Entered By: Gretta Cool, BSN, RN, CWS, Kim on 01/04/2020 17:22:14 Kevin Mckay ([id number]) -------------------------------------------------------------------------------- Pain Assessment Details Patient Name: Kevin Mckay Date of Service: 12/08/2019 2:15 PM Medical Record Number: [id number] Patient Account Number: [id number] Date of Birth/Sex: 06/20/1951 (68 y.o. M) Treating RN: Cornell Barman Primary Care Phillipa Morden: Laneta Simmers Other Clinician: Referring Eugene Isadore: Charlaine Dalton Treating Zeplin Aleshire/Extender: Melburn Hake, HOYT Weeks in Treatment: 0 Active Problems Location of Pain Severity and Description of Pain Patient Has Paino No Site Locations Pain Management and Medication Current Pain Management: Electronic Signature(s) Signed: 12/09/2019 5:04:05 PM By: Gretta Cool, BSN, RN, CWS, Kim RN, BSN Entered By: Gretta Cool, BSN, RN, CWS, Kim on 12/08/2019 14:08:20 Kevin Mckay ([id number]) -------------------------------------------------------------------------------- Patient/Caregiver Education Details Patient Name: Kevin Mckay Date of Service: 12/08/2019 2:15 PM Medical Record Number: [id number] Patient Account Number: [id number] Date of Birth/Gender: [birth year] (68 y.o. M) Treating RN: Montey Hora Primary Care Physician: Laneta Simmers Other Clinician: Referring Physician: Charlaine Dalton Treating Physician/Extender: Sharalyn Ink in Treatment: 0 Education Assessment Education Provided To: Patient and Caregiver Education Topics Provided Venous: Handouts: Other: leg elevation Methods: Explain/Verbal Responses: State content correctly Electronic Signature(s) Signed: 12/08/2019 4:29:48 PM By: Montey Hora Entered By: Montey Hora on 12/08/2019 15:21:23 Kevin Mckay  ([id number]) -------------------------------------------------------------------------------- Wound Assessment Details Patient Name: Kevin Mckay Date of Service: 12/08/2019 2:15 PM Medical Record Number: [id number] Patient Account Number: [id number] Date of Birth/Sex: February 19, 1951 (68 y.o. M) Treating RN: Cornell Barman Primary Care Aleph Nickson: Laneta Simmers Other Clinician: Referring Cahlil Sattar: Charlaine Dalton Treating Reanne Nellums/Extender: STONE III, HOYT Weeks in Treatment: 0 Wound Status Wound Number: 1 Primary Diabetic Wound/Ulcer of the Lower Extremity Etiology: Wound Location: Left, Dorsal Toe Second Secondary Trauma, Other Wounding Event: Trauma Etiology: Date Acquired: 11/24/2019 Wound Open Weeks Of Treatment: 0 Status: Clustered Wound: No Comorbid Cataracts, Chronic Obstructive Pulmonary Disease History: (COPD), Type II Diabetes, Osteoarthritis, Received Chemotherapy, Received Radiation Photos Wound Measurements Length: (cm) 0.6 Width: (cm) 0.7 Depth: (cm) 0.1 Area: (cm) 0.33 Volume: (cm) 0.033 % Reduction in Area: 0% % Reduction in Volume: 0% Epithelialization: None Tunneling: No Undermining: No Wound Description Classification: Grade 1 Wound Margin: Flat and Intact Exudate Amount: Medium Exudate Type: Serous Exudate Color: amber Foul Odor After Cleansing: No Slough/Fibrino Yes Wound Bed Granulation Amount: Large (67-100%) Exposed Structure Granulation Quality: Pink Fascia Exposed: No Necrotic Amount: Small (1-33%) Fat Layer (Subcutaneous Tissue) Exposed: Yes Necrotic Quality: Adherent Slough Tendon Exposed: No Muscle Exposed: No Joint Exposed: No Bone Exposed: No Electronic Signature(s) Signed: 12/08/2019 2:49:03 PM By: Worthy Keeler PA-C Signed: 12/09/2019 5:04:05 PM By: Gretta Cool, BSN, RN, CWS, Kim RN, BSN Entered By:  Worthy Keeler on 12/08/2019 14:49:03 Kevin Mckay, Kevin Mckay  ([id number]) -------------------------------------------------------------------------------- Vitals Details Patient Name: Kevin Mckay Date of Service: 12/08/2019 2:15 PM Medical Record Number: [id number] Patient Account Number: [id number] Date of Birth/Sex: 1950-09-21 (68 y.o. M) Treating RN: Cornell Barman Primary Care Grey Rakestraw: Laneta Simmers Other Clinician: Referring Alie Moudy: Charlaine Dalton Treating Gwen Sarvis/Extender: Melburn Hake, HOYT Weeks in Treatment: 0 Vital Signs Time Taken: 14:05 Temperature (F): 98.2 Height (in): 68 Pulse (bpm): 66 Source: Stated Respiratory Rate (breaths/min): 16 Weight (lbs): 140 Blood Pressure (mmHg): 172/63 Source: Measured Reference Range: 80 - 120 mg / dl Body Mass Index (BMI): 21.3 Electronic Signature(s) Signed: 12/09/2019 5:04:05 PM By: Gretta Cool, BSN, RN, CWS, Kim RN, BSN Entered By: Gretta Cool, BSN, RN, CWS, Kim on 12/08/2019 14:09:26

## 2019-12-09 NOTE — Progress Notes (Signed)
SILVER, PARKEY ([id number]) Visit Report for 12/08/2019 Abuse/Suicide Risk Screen Details Patient Name: Kevin Mckay, Kevin Mckay Date of Service: 12/08/2019 2:15 PM Medical Record Number: [id number] Patient Account Number: [id number] Date of Birth/Sex: 07-Jan-1951 (69 y.o. M) Treating RN: Cornell Barman Primary Care Daelyn Mozer: Laneta Simmers Other Clinician: Referring Wilfred Dayrit: Charlaine Dalton Treating Rivka Baune/Extender: Melburn Hake, HOYT Weeks in Treatment: 0 Abuse/Suicide Risk Screen Items Answer ABUSE RISK SCREEN: Has anyone close to you tried to hurt or harm you recentlyo No Do you feel uncomfortable with anyone in your familyo No Has anyone forced you do things that you didnot want to doo No Electronic Signature(s) Signed: 12/09/2019 5:04:05 PM By: Gretta Cool, BSN, RN, CWS, Kim RN, BSN Entered By: Gretta Cool, BSN, RN, CWS, Kim on 12/08/2019 14:23:36 Kevin Mckay ([id number]) -------------------------------------------------------------------------------- Activities of Daily Living Details Patient Name: Kevin Mckay Date of Service: 12/08/2019 2:15 PM Medical Record Number: [id number] Patient Account Number: [id number] Date of Birth/Sex: May 19, 1951 (69 y.o. M) Treating RN: Cornell Barman Primary Care Taeshaun Rames: Laneta Simmers Other Clinician: Referring Caniya Tagle: Charlaine Dalton Treating Zarrah Loveland/Extender: Melburn Hake, HOYT Weeks in Treatment: 0 Activities of Daily Living Items Answer Activities of Daily Living (Please select one for each item) Drive Automobile Completely Able Take Medications Completely Able Use Telephone Completely Able Care for Appearance Completely Able Use Toilet Completely Able Bath / Shower Completely Able Dress Self Completely Able Feed Self Completely Able Walk Completely Able Get In / Out Bed Completely Able Housework Completely Able Prepare Meals Completely [place] for Self Completely Able Electronic  Signature(s) Signed: 12/09/2019 5:04:05 PM By: Gretta Cool, BSN, RN, CWS, Kim RN, BSN Entered By: Gretta Cool, BSN, RN, CWS, Kim on 12/08/2019 14:23:47 Kevin Mckay ([id number]) -------------------------------------------------------------------------------- Education Screening Details Patient Name: Kevin Mckay Date of Service: 12/08/2019 2:15 PM Medical Record Number: [id number] Patient Account Number: [id number] Date of Birth/Sex: 06-23-1951 (69 y.o. M) Treating RN: Cornell Barman Primary Care Adiah Guereca: Laneta Simmers Other Clinician: Referring Kortlyn Koltz: Charlaine Dalton Treating Krishang Reading/Extender: Melburn Hake, HOYT Weeks in Treatment: 0 Primary Learner Assessed: Patient Learning Preferences/Education Level/Primary Language Learning Preference: Explanation Highest Education Level: High School Preferred Language: English Cognitive Barrier Language Barrier: No Translator Needed: No Memory Deficit: No Emotional Barrier: No Cultural/Religious Beliefs Affecting Medical Care: No Physical Barrier Impaired Vision: Yes Legally Blind, left Impaired Hearing: No Decreased Hand dexterity: No Knowledge/Comprehension Knowledge Level: High Comprehension Level: High Ability to understand written instructions: High Ability to understand verbal instructions: High Motivation Anxiety Level: Calm Cooperation: Cooperative Education Importance: Acknowledges Need Interest in Health Problems: Asks Questions Perception: Coherent Willingness to Engage in Self-Management High Activities: Readiness to Engage in Self-Management High Activities: Engineer, maintenance) Signed: 12/09/2019 5:04:05 PM By: Gretta Cool, BSN, RN, CWS, Kim RN, BSN Entered By: Gretta Cool, BSN, RN, CWS, Kim on 12/08/2019 14:24:19 Kevin Mckay ([id number]) -------------------------------------------------------------------------------- Fall Risk Assessment Details Patient Name: Kevin Mckay Date of Service: 12/08/2019 2:15  PM Medical Record Number: [id number] Patient Account Number: [id number] Date of Birth/Sex: 1950-07-27 (69 y.o. M) Treating RN: Cornell Barman Primary Care Maymuna Detzel: Laneta Simmers Other Clinician: Referring Traci Plemons: Charlaine Dalton Treating Sylvester Salonga/Extender: Melburn Hake, HOYT Weeks in Treatment: 0 Fall Risk Assessment Items Have you had 2 or more falls in the last 12 monthso 0 Yes Have you had any fall that resulted in injury in the last 12 monthso 0 No FALLS RISK SCREEN History of falling - immediate or within 3 months 25 Yes Secondary diagnosis (Do you have 2 or more medical diagnoseso) 15 Yes Ambulatory aid None/bed rest/wheelchair/nurse 0 Yes Crutches/cane/walker 0 No Furniture  0 No Intravenous therapy Access/Saline/Heparin Lock 0 No Gait/Transferring Normal/ bed rest/ wheelchair 0 Yes Weak (short steps with or without shuffle, stooped but able to lift head while walking, may 0 No seek support from furniture) Impaired (short steps with shuffle, may have difficulty arising from chair, head down, impaired 0 No balance) Mental Status Oriented to own ability 0 Yes Electronic Signature(s) Signed: 12/09/2019 5:04:05 PM By: Gretta Cool, BSN, RN, CWS, Kim RN, BSN Entered By: Gretta Cool, BSN, RN, CWS, Kim on 12/08/2019 14:24:52 Kevin Mckay ([id number]) -------------------------------------------------------------------------------- Foot Assessment Details Patient Name: Kevin Mckay Date of Service: 12/08/2019 2:15 PM Medical Record Number: [id number] Patient Account Number: [id number] Date of Birth/Sex: March 04, 1951 (69 y.o. M) Treating RN: Cornell Barman Primary Care Cree Napoli: Laneta Simmers Other Clinician: Referring Toinette Lackie: Charlaine Dalton Treating Mirha Brucato/Extender: Melburn Hake, HOYT Weeks in Treatment: 0 Foot Assessment Items Site Locations + = Sensation present, - = Sensation absent, C = Callus, U = Ulcer R = Redness, W = Warmth, M = Maceration, PU = Pre-ulcerative  lesion F = Fissure, S = Swelling, D = Dryness Assessment Right: Left: Other Deformity: No No Prior Foot Ulcer: No No Prior Amputation: No No Charcot Joint: No No Ambulatory Status: Ambulatory Without Help Gait: Steady Electronic Signature(s) Signed: 12/09/2019 5:04:05 PM By: Gretta Cool, BSN, RN, CWS, Kim RN, BSN Entered By: Gretta Cool, BSN, RN, CWS, Kim on 12/08/2019 14:26:26 Kevin Mckay ([id number]) -------------------------------------------------------------------------------- Nutrition Risk Screening Details Patient Name: Kevin Mckay Date of Service: 12/08/2019 2:15 PM Medical Record Number: [id number] Patient Account Number: [id number] Date of Birth/Sex: 1951/04/06 (68 y.o. M) Treating RN: Cornell Barman Primary Care Andri Prestia: Laneta Simmers Other Clinician: Referring Vong Garringer: Charlaine Dalton Treating Lovie Zarling/Extender: Melburn Hake, HOYT Weeks in Treatment: 0 Height (in): 68 Weight (lbs): 140 Body Mass Index (BMI): 21.3 Nutrition Risk Screening Items Score Screening NUTRITION RISK SCREEN: I have an illness or condition that made me change the kind and/or amount of food I eat 0 No I eat fewer than two meals per day 0 No I eat few fruits and vegetables, or milk products 0 No I have three or more drinks of beer, liquor or wine almost every day 0 No I have tooth or mouth problems that make it hard for me to eat 0 No I don't always have enough money to buy the food I need 0 No I eat alone most of the time 0 No I take three or more different prescribed or over-the-counter drugs a day 0 No Without wanting to, I have lost or gained 10 pounds in the last six months 0 No I am not always physically able to shop, cook and/or feed myself 0 No Nutrition Protocols Good Risk Protocol 0 No interventions needed Moderate Risk Protocol High Risk Proctocol Risk Level: Good Risk Score: 0 Electronic Signature(s) Signed: 12/09/2019 5:04:05 PM By: Gretta Cool, BSN, RN, CWS, Kim RN, BSN Entered  By: Gretta Cool, BSN, RN, CWS, Kim on 12/08/2019 14:25:06

## 2019-12-14 ENCOUNTER — Encounter: Payer: Self-pay | Admitting: Internal Medicine

## 2019-12-14 ENCOUNTER — Inpatient Hospital Stay: Payer: BC Managed Care – PPO

## 2019-12-14 ENCOUNTER — Other Ambulatory Visit: Payer: Self-pay

## 2019-12-14 ENCOUNTER — Inpatient Hospital Stay (HOSPITAL_BASED_OUTPATIENT_CLINIC_OR_DEPARTMENT_OTHER): Payer: BC Managed Care – PPO | Admitting: Internal Medicine

## 2019-12-14 DIAGNOSIS — C182 Malignant neoplasm of ascending colon: Secondary | ICD-10-CM

## 2019-12-14 DIAGNOSIS — Z7189 Other specified counseling: Secondary | ICD-10-CM

## 2019-12-14 DIAGNOSIS — Z5112 Encounter for antineoplastic immunotherapy: Secondary | ICD-10-CM | POA: Diagnosis not present

## 2019-12-14 DIAGNOSIS — Z95828 Presence of other vascular implants and grafts: Secondary | ICD-10-CM

## 2019-12-14 LAB — CBC WITH DIFFERENTIAL/PLATELET
Abs Immature Granulocytes: 0.01 10*3/uL (ref 0.00–0.07)
Basophils Absolute: 0 10*3/uL (ref 0.0–0.1)
Basophils Relative: 1 %
Eosinophils Absolute: 0.4 10*3/uL (ref 0.0–0.5)
Eosinophils Relative: 6 %
HCT: 34.9 % — ABNORMAL LOW (ref 39.0–52.0)
Hemoglobin: 11.8 g/dL — ABNORMAL LOW (ref 13.0–17.0)
Immature Granulocytes: 0 %
Lymphocytes Relative: 22 %
Lymphs Abs: 1.5 10*3/uL (ref 0.7–4.0)
MCH: 27.4 pg (ref 26.0–34.0)
MCHC: 33.8 g/dL (ref 30.0–36.0)
MCV: 81.2 fL (ref 80.0–100.0)
Monocytes Absolute: 0.6 10*3/uL (ref 0.1–1.0)
Monocytes Relative: 8 %
Neutro Abs: 4.2 10*3/uL (ref 1.7–7.7)
Neutrophils Relative %: 63 %
Platelets: 134 10*3/uL — ABNORMAL LOW (ref 150–400)
RBC: 4.3 MIL/uL (ref 4.22–5.81)
RDW: 15.1 % (ref 11.5–15.5)
WBC: 6.7 10*3/uL (ref 4.0–10.5)
nRBC: 0 % (ref 0.0–0.2)

## 2019-12-14 LAB — COMPREHENSIVE METABOLIC PANEL
ALT: 20 U/L (ref 0–44)
AST: 18 U/L (ref 15–41)
Albumin: 3.5 g/dL (ref 3.5–5.0)
Alkaline Phosphatase: 67 U/L (ref 38–126)
Anion gap: 9 (ref 5–15)
BUN: 23 mg/dL (ref 8–23)
CO2: 29 mmol/L (ref 22–32)
Calcium: 8.6 mg/dL — ABNORMAL LOW (ref 8.9–10.3)
Chloride: 97 mmol/L — ABNORMAL LOW (ref 98–111)
Creatinine, Ser: 1.07 mg/dL (ref 0.61–1.24)
GFR calc Af Amer: >60 mL/min (ref >60)
GFR calc non Af Amer: >60 mL/min (ref >60)
Glucose, Bld: 164 mg/dL — ABNORMAL HIGH (ref 70–99)
Potassium: 4.3 mmol/L (ref 3.5–5.1)
Sodium: 135 mmol/L (ref 135–145)
Total Bilirubin: 0.5 mg/dL (ref 0.3–1.2)
Total Protein: 6.7 g/dL (ref 6.5–8.1)

## 2019-12-14 MED ORDER — LEUCOVORIN CALCIUM INJECTION 350 MG
400.0000 mg/m2 | Freq: Once | INTRAVENOUS | Status: AC
  Administered 2019-12-14: 700 mg via INTRAVENOUS
  Filled 2019-12-14: qty 25

## 2019-12-14 MED ORDER — SODIUM CHLORIDE 0.9 % IV SOLN
5.0000 mg/kg | Freq: Once | INTRAVENOUS | Status: AC
  Administered 2019-12-14: 300 mg via INTRAVENOUS
  Filled 2019-12-14: qty 12

## 2019-12-14 MED ORDER — OXALIPLATIN CHEMO INJECTION 100 MG/20ML
85.0000 mg/m2 | Freq: Once | INTRAVENOUS | Status: AC
  Administered 2019-12-14: 150 mg via INTRAVENOUS
  Filled 2019-12-14: qty 20

## 2019-12-14 MED ORDER — SODIUM CHLORIDE 0.9 % IV SOLN
2400.0000 mg/m2 | INTRAVENOUS | Status: DC
  Administered 2019-12-14: 4200 mg via INTRAVENOUS
  Filled 2019-12-14: qty 84

## 2019-12-14 MED ORDER — PALONOSETRON HCL INJECTION 0.25 MG/5ML
0.2500 mg | Freq: Once | INTRAVENOUS | Status: AC
  Administered 2019-12-14: 0.25 mg via INTRAVENOUS
  Filled 2019-12-14: qty 5

## 2019-12-14 MED ORDER — SODIUM CHLORIDE 0.9 % IV SOLN
4.0000 mg | Freq: Once | INTRAVENOUS | Status: AC
  Administered 2019-12-14: 4 mg via INTRAVENOUS
  Filled 2019-12-14: qty 0.4

## 2019-12-14 MED ORDER — DEXTROSE 5 % IV SOLN
Freq: Once | INTRAVENOUS | Status: AC
  Filled 2019-12-14: qty 250

## 2019-12-14 MED ORDER — SODIUM CHLORIDE 0.9 % IV SOLN
Freq: Once | INTRAVENOUS | Status: AC
  Filled 2019-12-14: qty 250

## 2019-12-14 MED ORDER — SODIUM CHLORIDE 0.9% FLUSH
10.0000 mL | INTRAVENOUS | Status: DC | PRN
  Administered 2019-12-14: 10 mL via INTRAVENOUS
  Filled 2019-12-14: qty 10

## 2019-12-14 NOTE — Progress Notes (Signed)
[facility] NOTE  Patient Care Team: Laneta Simmers, NP as PCP - General (Nurse Practitioner) Telford Nab, RN as Registered Nurse Clent Jacks, RN as Oncology Nurse Navigator  CHIEF COMPLAINTS/PURPOSE OF CONSULTATION: Lung cancer/colon cancer   Oncology History Overview Note  # July 2020- SMALL CELL CA METASTATIC TO MEDIASTINAL LN [open Bx; Dr.Oaks]; TxN2M0; July 2nd 2020-PET- 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  MRI brain negative  # aug 17th 2020-carbo etoposide-RT [? 8/19]; #4 cycle carbo-Etop [finished dec 30th,2020]  #August 2020 iron deficient anemia-question etiology; IV Feraheme [no colonoscopy]  # OCT 2020- colo/ Cecal adeno ca; MRI liver- 1 cm enhancing lesion "metastasis" [Dr.Anna/Dr.Cannon]-difficulty biopsy.  November 02-2019 right hemicolectomy- STAGE III [pT3pN1 (2/13LN)]- HOLD adjuvant therapy. MMR- INTACT/LOW  #May 2021-liver biopsy-possible adenocarcinoma; colorectal origin.  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 [facility])   02/09/2019 - 07/12/2019 Chemotherapy   The patient had palonosetron (ALOXI) injection 0.25 mg, 0.25 mg, Intravenous,  Once, 4 of 4 cycles Administration: 0.25 mg (02/09/2019), 0.25 mg (03/03/2019), 0.25 mg (06/02/2019), 0.25 mg (06/22/2019) CARBOplatin (PARAPLATIN) 410 mg in sodium chloride 0.9 % 250 mL chemo infusion, 410 mg (100 % of original dose 414 mg), Intravenous,   Once, 4 of 4 cycles Dose modification:   (original dose 414 mg, Cycle 1) Administration: 410 mg (02/09/2019), 410 mg (03/03/2019), 410 mg (06/02/2019), 410 mg (06/22/2019) etoposide (VEPESID) 180 mg in sodium chloride 0.9 % 500 mL chemo infusion, 100 mg/m2 = 180 mg, Intravenous,  Once, 4 of 4 cycles Administration: 180 mg (02/09/2019), 180 mg (02/10/2019), 180 mg (02/11/2019), 180 mg (03/03/2019), 180 mg (03/04/2019), 180 mg (03/05/2019), 180 mg (06/02/2019), 180 mg (06/03/2019), 180 mg (06/04/2019), 180 mg (06/22/2019), 180 mg (06/23/2019), 180 mg (06/24/2019) fosaprepitant (EMEND) 150 mg, dexamethasone (DECADRON) 6 mg in sodium chloride 0.9 % 145 mL IVPB, , Intravenous,  Once, 2 of 2 cycles Administration:  (06/02/2019),  (06/22/2019)  for chemotherapy treatment.    Cancer of ascending colon ([place])  05/04/2019 Initial Diagnosis   Cancer of ascending colon ([place])   11/30/2019 -  Chemotherapy   The patient had dexamethasone (DECADRON) 4 MG tablet, 8 mg, Oral, Daily, 1 of 1 cycle, Start date: --, End date: -- palonosetron (ALOXI) injection 0.25 mg, 0.25 mg, Intravenous,  Once, 2 of 5 cycles Administration: 0.25 mg (11/30/2019) leucovorin 700 mg in dextrose 5 % 250 mL infusion, 400 mg/m2 = 700 mg, Intravenous,  Once, 2 of 5 cycles Administration: 700 mg (11/30/2019) oxaliplatin (ELOXATIN) 150 mg in dextrose 5 % 500 mL chemo infusion, 85 mg/m2 = 150 mg, Intravenous,  Once, 2 of 5 cycles fluorouracil (ADRUCIL) 4,200 mg in sodium chloride 0.9 % 66 mL chemo infusion, 2,400 mg/m2 = 4,200 mg, Intravenous, 1 Day/Dose, 2 of 5 cycles Administration: 4,200 mg (11/30/2019) bevacizumab-bvzr (ZIRABEV) 300 mg in sodium chloride 0.9 % 100 mL chemo infusion, 5 mg/kg = 300 mg, Intravenous,  Once, 2 of 5 cycles  for chemotherapy treatment.     HISTORY OF  PRESENTING ILLNESS:   Kevin Mckay 69 y.o.  male synchronous primaries-synchronous primaries limited stage small cell lung cancer s/p chemoradiation; and stage IV colon  cancer-with metastasis to liver disease to proceed with chemotherapy FOLFOX plus Avastin.  2 weeks ago patient received 5-FU alone because of poor infection.  He was evaluated by wound care currently off antibiotics.  No nausea no vomiting.  No abdominal pain.  Sugars are better controlled as per patient.  Review of Systems  Constitutional: Positive for malaise/fatigue. Negative for chills, diaphoresis, fever and weight loss.  HENT: Negative for nosebleeds and sore throat.   Eyes: Negative for double vision.  Respiratory: Positive for cough, sputum production and shortness of breath. Negative for hemoptysis and wheezing.   Cardiovascular: Negative for chest pain, palpitations, orthopnea and leg swelling.  Gastrointestinal: Negative for abdominal pain, blood in stool, constipation, diarrhea, heartburn, melena, nausea and vomiting.  Genitourinary: Negative for dysuria, frequency and urgency.  Musculoskeletal: Negative for back pain and joint pain.  Skin: Negative.  Negative for itching and rash.  Neurological: Negative for dizziness, focal weakness and weakness.  Endo/Heme/Allergies: Does not bruise/bleed easily.  Psychiatric/Behavioral: Negative for depression. The patient is not nervous/anxious and does not have insomnia.      MEDICAL HISTORY:  Past Medical History:  Diagnosis Date  . Asthma   . Cancer ([place])    Metastatic small cell lung cancer  . Cerebral aneurysm   . Chronic painful diabetic neuropathy ([place])   . Depression   . Diabetes mellitus without complication ([place])   . Essential hypertension   . History of kidney stones   . Occasional tremors   . Tobacco use     SURGICAL HISTORY: Past Surgical History:  Procedure Laterality Date  . ABDOMINAL SURGERY     age 63. trauma surgery due to forklift injury- liver and spleen  . COLONOSCOPY WITH PROPOFOL N/A 03/27/2019   Procedure: COLONOSCOPY WITH PROPOFOL;  Surgeon: Jonathon Bellows, MD;  Location: [facility] ENDOSCOPY;  Service:  Gastroenterology;  Laterality: N/A;  . COLOSTOMY REVISION Right 05/04/2019   Procedure: COLON RESECTION RIGHT-right hemicolectomy-open;  Surgeon: Fredirick Maudlin, MD;  Location: [facility] ORS;  Service: General;  Laterality: Right;  . ESOPHAGOGASTRODUODENOSCOPY (EGD) WITH PROPOFOL N/A 03/27/2019   Procedure: ESOPHAGOGASTRODUODENOSCOPY (EGD) WITH PROPOFOL;  Surgeon: Jonathon Bellows, MD;  Location: [facility] ENDOSCOPY;  Service: Gastroenterology;  Laterality: N/A;  . IR GENERIC HISTORICAL  05/31/2016   IR ANGIO VERTEBRAL SEL VERTEBRAL BILAT MOD SED 05/31/2016 Consuella Lose, MD [facility]-INTERV RAD  . IR GENERIC HISTORICAL  05/31/2016   IR ANGIO INTRA EXTRACRAN SEL INTERNAL CAROTID BILAT MOD SED 05/31/2016 Consuella Lose, MD [facility]INTER[facility]  . PORTACATH PLACEMENT Right 02/04/2019   Procedure: INSERTION PORT-A-CATH;  Surgeon: [name], MD;  Location: [facility] ORS;  Service: General;  Laterality: Right;  . THORACOTOMY Left 01/22/2019   Procedure: PRE OP BRONCH LEFT ANTERIOR THORACOTOMY WITH BIOPSY OF MEDIASTINAL MASS;  Surgeon: [name], MD;  Location: [facility] ORS;  Service: General;  Laterality: Left;    SOCIAL HISTORY: Social History   Socioeconomic History  . Marital status: Married    Spouse name: Not on file  . Number of children: Not on file  . Years of education: Not on file  . Highest education level: Not on file  Occupational History  . Not on file  Tobacco Use  . Smoking status: Current Every Day Smoker    Packs/day: 0.50    Years: 50.00    Pack years: 25.00    Types: Cigarettes  .  Smokeless tobacco: Never Used  Vaping Use  . Vaping Use: Every day  . Substances: Nicotine  Substance and Sexual Activity  . Alcohol use: No  . Drug use: Yes    Types: Barbituates, Marijuana  . Sexual activity: Not on file  Other Topics Concern  . Not on file  Social History Narrative   Lives in [place]; wife/ son/grandson [custody]; smoker; vending business; hx of alcoholism- quit at 57.    Social  Determinants of Health   Financial Resource Strain: Low Risk   . Difficulty of Paying Living Expenses: Not hard at all  Food Insecurity: No Food Insecurity  . Worried About Charity fundraiser in the Last Year: Never true  . Ran Out of Food in the Last Year: Never true  Transportation Needs: No Transportation Needs  . Lack of Transportation (Medical): No  . Lack of Transportation (Non-Medical): No  Physical Activity: Inactive  . Days of Exercise per Week: 0 days  . Minutes of Exercise per Session: 0 min  Stress: No Stress Concern Present  . Feeling of Stress : Not at all  Social Connections: Unknown  . Frequency of Communication with Friends and Family: Patient refused  . Frequency of Social Gatherings with Friends and Family: Patient refused  . Attends Religious Services: Patient refused  . Active Member of Clubs or Organizations: Patient refused  . Attends Archivist Meetings: Patient refused  . Marital Status: Patient refused  Intimate Partner Violence: Not At Risk  . Fear of Current or Ex-Partner: No  . Emotionally Abused: No  . Physically Abused: No  . Sexually Abused: No    FAMILY HISTORY: Family History  Problem Relation Age of Onset  . Lymphoma Father   . Liver cancer Paternal Uncle   . Lung cancer Paternal Uncle   . Lung cancer Maternal Uncle     ALLERGIES:  is allergic to ferumoxytol, amoxicillin, and codeine.  MEDICATIONS:  Current Outpatient Medications  Medication Sig Dispense Refill  . amLODipine (NORVASC) 10 MG tablet Take 10 mg by mouth every morning.     . budesonide-formoterol (SYMBICORT) 160-4.5 MCG/ACT inhaler Inhale 2 puffs into the lungs 2 (two) times daily.    . DULoxetine (CYMBALTA) 30 MG capsule Take 30 mg by mouth every morning.     . ferrous sulfate 324 (65 Fe) MG TBEC Take 324 mg by mouth daily.     Marland Kitchen gabapentin (NEURONTIN) 300 MG capsule Take 600 mg by mouth 3 (three) times daily.     . hydrochlorothiazide (HYDRODIURIL) 25 MG  tablet Take 25 mg by mouth daily.     . insulin glargine (LANTUS) 100 UNIT/ML injection Inject into the skin daily. Units based By sliding scale    . Ipratropium-Albuterol (COMBIVENT) 20-100 MCG/ACT AERS respimat Inhale into the lungs.    . lidocaine-prilocaine (EMLA) cream Apply 1 application topically as needed. 30-45 mins prior to port access. 30 g 0  . linaclotide (LINZESS) 145 MCG CAPS capsule Take 145 mcg by mouth daily before breakfast.    . lovastatin (MEVACOR) 20 MG tablet Take 20 mg by mouth every evening.    . metFORMIN (GLUCOPHAGE-XR) 500 MG 24 hr tablet Take 1,000 mg by mouth 2 (two) times daily.    . Multiple Vitamins-Minerals (MULTIVITAMIN WITH MINERALS) tablet Take 1 tablet by mouth daily.    Marland Kitchen omeprazole (PRILOSEC) 40 MG capsule Take 1 capsule (40 mg total) by mouth daily. (Patient taking differently: Take 40 mg by mouth every morning. )  90 capsule 1  . ondansetron (ZOFRAN) 8 MG tablet Take 1 tablet (8 mg total) by mouth every 8 (eight) hours as needed for nausea or vomiting (start 3 days; after chemo). 40 tablet 1  . polyethylene glycol powder (MIRALAX) 17 GM/SCOOP powder Mix full container in 64 ounces of Gatorade or other clear liquid. NO RED Liquids 238 g 0  . prazosin (MINIPRESS) 1 MG capsule Take 1 mg by mouth at bedtime.    . psyllium (METAMUCIL) 58.6 % powder Take 1 packet by mouth daily.    Marland Kitchen pyridOXINE (VITAMIN B-6) 100 MG tablet Take 100 mg by mouth daily.    . sitaGLIPtin (JANUVIA) 25 MG tablet Take 25 mg by mouth daily.    . vitamin B-12 (CYANOCOBALAMIN) 100 MCG tablet Take 100 mcg by mouth daily.    . metFORMIN (GLUCOPHAGE) 1000 MG tablet Take 1,000 mg by mouth 2 (two) times daily with a meal. (Patient not taking: Reported on 12/14/2019)     No current facility-administered medications for this visit.   Facility-Administered Medications Ordered in Other Visits  Medication Dose Route Frequency Provider Last Rate Last Admin  . bevacizumab-bvzr (ZIRABEV) 300 mg in  sodium chloride 0.9 % 100 mL chemo infusion  5 mg/kg (Treatment Plan Recorded) Intravenous Once Charlaine Dalton R, MD      . dexamethasone (DECADRON) 4 mg in sodium chloride 0.9 % 50 mL IVPB  4 mg Intravenous Once Cammie Sickle, MD 240 mL/hr at 12/14/19 0949 4 mg at 12/14/19 0949  . dextrose 5 % solution   Intravenous Once Charlaine Dalton R, MD      . fluorouracil (ADRUCIL) 4,200 mg in sodium chloride 0.9 % 66 mL chemo infusion  2,400 mg/m2 (Treatment Plan Recorded) Intravenous 1 day or 1 dose Charlaine Dalton R, MD      . leucovorin 700 mg in dextrose 5 % 250 mL infusion  400 mg/m2 (Treatment Plan Recorded) Intravenous Once Charlaine Dalton R, MD      . oxaliplatin (ELOXATIN) 150 mg in dextrose 5 % 500 mL chemo infusion  85 mg/m2 (Treatment Plan Recorded) Intravenous Once Cammie Sickle, MD          .  PHYSICAL EXAMINATION: ECOG PERFORMANCE STATUS: 0 - Asymptomatic  Vitals:   12/14/19 0824  BP: 118/62  Pulse: 65  Resp: 16  Temp: (!) 97.1 F (36.2 C)  SpO2: 100%   Filed Weights   12/14/19 0824  Weight: 148 lb (67.1 kg)    Physical Exam HENT:     Head: Normocephalic and atraumatic.     Mouth/Throat:     Pharynx: No oropharyngeal exudate.  Eyes:     Pupils: Pupils are equal, round, and reactive to light.  Cardiovascular:     Rate and Rhythm: Normal rate and regular rhythm.  Pulmonary:     Effort: No respiratory distress.     Breath sounds: No wheezing.  Abdominal:     General: Bowel sounds are normal. There is no distension.     Palpations: Abdomen is soft. There is no mass.     Tenderness: There is no abdominal tenderness. There is no guarding or rebound.     Comments: Abdominal incision well-healed.  Musculoskeletal:        General: No tenderness. Normal range of motion.     Cervical back: Normal range of motion and neck supple.  Skin:    General: Skin is warm.  Neurological:     Mental Status: He is alert and  oriented to person, place,  and time.  Psychiatric:        Mood and Affect: Affect normal.     LABORATORY DATA:  I have reviewed the data as listed Lab Results  Component Value Date   WBC 6.7 12/14/2019   HGB 11.8 (L) 12/14/2019   HCT 34.9 (L) 12/14/2019   MCV 81.2 12/14/2019   PLT 134 (L) 12/14/2019   Recent Labs    11/16/19 0847 11/30/19 0840 12/14/19 0814  NA 132* 137 135  K 3.6 3.9 4.3  CL 95* 100 97*  CO2 _0 GLUCOSE 422* 224* 164*  BUN _1 CREATININE 1.12 1.08 1.07  CALCIUM 9.0 9.1 8.6*  GFRNONAA >60 >60 >60  GFRAA >60 >60 >60  PROT 7.0 6.9 6.7  ALBUMIN 3.8 3.6 3.5  AST _2 ALT _3 ALKPHOS 81 63 67  BILITOT 0.4 0.5 0.5    RADIOGRAPHIC STUDIES: I have personally reviewed the radiological images as listed and agreed with the findings in the report. DG Foot Complete Right  Result Date: 12/02/2019 CLINICAL DATA:  Right foot infection/ulceration.  Diabetes mellitus EXAM: RIGHT FOOT COMPLETE - 3+ VIEW COMPARISON:  None. FINDINGS: Frontal, with oblique, and lateral views were obtained. Bones are osteoporotic. No fracture or dislocation. There is no appreciable joint space narrowing or erosion. No evident bony destruction. No soft tissue air or radiopaque foreign body. IMPRESSION: No bony destruction or erosion. No fracture or dislocation. Bones appear somewhat osteoporotic. No soft tissue air evident. Electronically Signed   By: Lowella Grip III M.D.   On: 12/02/2019 08:10    ASSESSMENT & PLAN:   Cancer of ascending colon ([place]) #Right colon cancer stage IV; adenocarcinoma [s/p liver biopsy]-recommend NGS.currentlly on FOLFOX + avastin today. Cycle #1 only 5FU [sec to toe infection]   # proceed with FOLFOX with Avastin chemotherapy today; Labs today reviewed;  acceptable for treatment today.   # Anemia- secondary- sec to chemo-hemoglobin on 11 stable  # Poorly controlled diabetes blood sugars-better controlled as per patients.  Will decrease dexamethasone to 4  mg.  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  S/p concurrent radiation x4 cycles.  CT scan August 20, 2019- stable.  # Right toe infection s/p eval- with wound care; stable.  Off antibiotics.  # HTN- 116/70s-stable.  Recommend control/monitoring esp on avastin. .  # COPD-stable. recommend continue inhalers.   # DISPOSITION: # chemo today;d-3- pump de-access # follow up in July 6th weeks-MD; labs-cbc/cmp/cea; UA; FOLFOX chemo+ Avastin; D-3 pump de-access- Dr.B   All questions were answered. The patient knows to call the clinic with any problems, questions or concerns.    Cammie Sickle, MD 12/14/2019 10:03 AM

## 2019-12-14 NOTE — Assessment & Plan Note (Addendum)
#  Right colon cancer stage IV; adenocarcinoma [s/p liver biopsy]-recommend NGS.currentlly on FOLFOX + avastin today. Cycle #1 only 5FU [sec to toe infection]   # proceed with FOLFOX with Avastin chemotherapy today; Labs today reviewed;  acceptable for treatment today.   # Anemia- secondary- sec to chemo-hemoglobin on 11 stable  # Poorly controlled diabetes blood sugars-better controlled as per patients.  Will decrease dexamethasone to 4 mg.  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  S/p concurrent radiation x4 cycles.  CT scan August 20, 2019- stable.  # Right toe infection s/p eval- with wound care; stable.  Off antibiotics.  # HTN- 116/70s-stable.  Recommend control/monitoring esp on avastin. .  # COPD-stable. recommend continue inhalers.   # DISPOSITION: # chemo today;d-3- pump de-access # follow up in July 6th weeks-MD; labs-cbc/cmp/cea; UA; FOLFOX chemo+ Avastin; D-3 pump de-access- Dr.B

## 2019-12-15 LAB — CEA: CEA: 5.1 ng/mL — ABNORMAL HIGH (ref 0.0–4.7)

## 2019-12-16 ENCOUNTER — Inpatient Hospital Stay: Payer: BC Managed Care – PPO

## 2019-12-16 ENCOUNTER — Other Ambulatory Visit: Payer: Self-pay

## 2019-12-16 VITALS — BP 145/63 | HR 64 | Temp 97.0°F | Resp 18

## 2019-12-16 DIAGNOSIS — Z7189 Other specified counseling: Secondary | ICD-10-CM

## 2019-12-16 DIAGNOSIS — Z5112 Encounter for antineoplastic immunotherapy: Secondary | ICD-10-CM | POA: Diagnosis not present

## 2019-12-16 DIAGNOSIS — C182 Malignant neoplasm of ascending colon: Secondary | ICD-10-CM

## 2019-12-16 MED ORDER — SODIUM CHLORIDE 0.9% FLUSH
10.0000 mL | INTRAVENOUS | Status: DC | PRN
  Administered 2019-12-16: 10 mL
  Filled 2019-12-16: qty 10

## 2019-12-16 MED ORDER — HEPARIN SOD (PORK) LOCK FLUSH 100 UNIT/ML IV SOLN
500.0000 [IU] | Freq: Once | INTRAVENOUS | Status: AC | PRN
  Administered 2019-12-16: 500 [IU]
  Filled 2019-12-16: qty 5

## 2019-12-22 ENCOUNTER — Ambulatory Visit: Payer: BC Managed Care – PPO | Admitting: Physician Assistant

## 2019-12-29 ENCOUNTER — Inpatient Hospital Stay: Payer: BC Managed Care – PPO

## 2019-12-29 ENCOUNTER — Inpatient Hospital Stay: Payer: BC Managed Care – PPO | Attending: Internal Medicine

## 2019-12-29 ENCOUNTER — Other Ambulatory Visit: Payer: Self-pay

## 2019-12-29 ENCOUNTER — Inpatient Hospital Stay: Payer: BC Managed Care – PPO | Admitting: Internal Medicine

## 2019-12-29 ENCOUNTER — Encounter: Payer: Self-pay | Admitting: Internal Medicine

## 2019-12-29 VITALS — BP 141/72 | HR 69 | Temp 96.7°F | Resp 16 | Ht 68.0 in | Wt 142.2 lb

## 2019-12-29 VITALS — BP 180/81 | HR 60 | Resp 16

## 2019-12-29 DIAGNOSIS — C182 Malignant neoplasm of ascending colon: Secondary | ICD-10-CM | POA: Insufficient documentation

## 2019-12-29 DIAGNOSIS — Z79899 Other long term (current) drug therapy: Secondary | ICD-10-CM | POA: Insufficient documentation

## 2019-12-29 DIAGNOSIS — Z7189 Other specified counseling: Secondary | ICD-10-CM

## 2019-12-29 DIAGNOSIS — M4800 Spinal stenosis, site unspecified: Secondary | ICD-10-CM | POA: Insufficient documentation

## 2019-12-29 DIAGNOSIS — F329 Major depressive disorder, single episode, unspecified: Secondary | ICD-10-CM | POA: Diagnosis not present

## 2019-12-29 DIAGNOSIS — M545 Low back pain, unspecified: Secondary | ICD-10-CM

## 2019-12-29 DIAGNOSIS — Z7951 Long term (current) use of inhaled steroids: Secondary | ICD-10-CM | POA: Insufficient documentation

## 2019-12-29 DIAGNOSIS — C3490 Malignant neoplasm of unspecified part of unspecified bronchus or lung: Secondary | ICD-10-CM | POA: Insufficient documentation

## 2019-12-29 DIAGNOSIS — Z5111 Encounter for antineoplastic chemotherapy: Secondary | ICD-10-CM | POA: Diagnosis not present

## 2019-12-29 DIAGNOSIS — E11621 Type 2 diabetes mellitus with foot ulcer: Secondary | ICD-10-CM | POA: Diagnosis not present

## 2019-12-29 DIAGNOSIS — J449 Chronic obstructive pulmonary disease, unspecified: Secondary | ICD-10-CM | POA: Insufficient documentation

## 2019-12-29 DIAGNOSIS — F17210 Nicotine dependence, cigarettes, uncomplicated: Secondary | ICD-10-CM | POA: Diagnosis not present

## 2019-12-29 DIAGNOSIS — I739 Peripheral vascular disease, unspecified: Secondary | ICD-10-CM | POA: Insufficient documentation

## 2019-12-29 DIAGNOSIS — I10 Essential (primary) hypertension: Secondary | ICD-10-CM | POA: Insufficient documentation

## 2019-12-29 DIAGNOSIS — E1165 Type 2 diabetes mellitus with hyperglycemia: Secondary | ICD-10-CM | POA: Insufficient documentation

## 2019-12-29 DIAGNOSIS — Z794 Long term (current) use of insulin: Secondary | ICD-10-CM | POA: Diagnosis not present

## 2019-12-29 DIAGNOSIS — D649 Anemia, unspecified: Secondary | ICD-10-CM | POA: Diagnosis not present

## 2019-12-29 DIAGNOSIS — F1011 Alcohol abuse, in remission: Secondary | ICD-10-CM | POA: Diagnosis not present

## 2019-12-29 DIAGNOSIS — C771 Secondary and unspecified malignant neoplasm of intrathoracic lymph nodes: Secondary | ICD-10-CM | POA: Diagnosis not present

## 2019-12-29 DIAGNOSIS — C787 Secondary malignant neoplasm of liver and intrahepatic bile duct: Secondary | ICD-10-CM | POA: Insufficient documentation

## 2019-12-29 LAB — COMPREHENSIVE METABOLIC PANEL
ALT: 24 U/L (ref 0–44)
AST: 26 U/L (ref 15–41)
Albumin: 3.7 g/dL (ref 3.5–5.0)
Alkaline Phosphatase: 72 U/L (ref 38–126)
Anion gap: 10 (ref 5–15)
BUN: 15 mg/dL (ref 8–23)
CO2: 28 mmol/L (ref 22–32)
Calcium: 9.2 mg/dL (ref 8.9–10.3)
Chloride: 96 mmol/L — ABNORMAL LOW (ref 98–111)
Creatinine, Ser: 0.91 mg/dL (ref 0.61–1.24)
GFR calc Af Amer: >60 mL/min (ref >60)
GFR calc non Af Amer: >60 mL/min (ref >60)
Glucose, Bld: 227 mg/dL — ABNORMAL HIGH (ref 70–99)
Potassium: 3.8 mmol/L (ref 3.5–5.1)
Sodium: 134 mmol/L — ABNORMAL LOW (ref 135–145)
Total Bilirubin: 0.4 mg/dL (ref 0.3–1.2)
Total Protein: 7.2 g/dL (ref 6.5–8.1)

## 2019-12-29 LAB — CBC WITH DIFFERENTIAL/PLATELET
Abs Immature Granulocytes: 0.03 10*3/uL (ref 0.00–0.07)
Basophils Absolute: 0 10*3/uL (ref 0.0–0.1)
Basophils Relative: 1 %
Eosinophils Absolute: 0.4 10*3/uL (ref 0.0–0.5)
Eosinophils Relative: 5 %
HCT: 37.5 % — ABNORMAL LOW (ref 39.0–52.0)
Hemoglobin: 12.7 g/dL — ABNORMAL LOW (ref 13.0–17.0)
Immature Granulocytes: 0 %
Lymphocytes Relative: 22 %
Lymphs Abs: 1.8 10*3/uL (ref 0.7–4.0)
MCH: 27.2 pg (ref 26.0–34.0)
MCHC: 33.9 g/dL (ref 30.0–36.0)
MCV: 80.3 fL (ref 80.0–100.0)
Monocytes Absolute: 0.7 10*3/uL (ref 0.1–1.0)
Monocytes Relative: 8 %
Neutro Abs: 5.2 10*3/uL (ref 1.7–7.7)
Neutrophils Relative %: 64 %
Platelets: 147 10*3/uL — ABNORMAL LOW (ref 150–400)
RBC: 4.67 MIL/uL (ref 4.22–5.81)
RDW: 17 % — ABNORMAL HIGH (ref 11.5–15.5)
WBC: 8.1 10*3/uL (ref 4.0–10.5)
nRBC: 0 % (ref 0.0–0.2)

## 2019-12-29 LAB — URINALYSIS, COMPLETE (UACMP) WITH MICROSCOPIC
Bacteria, UA: NONE SEEN
Bilirubin Urine: NEGATIVE
Glucose, UA: >=500 mg/dL — AB
Hgb urine dipstick: NEGATIVE
Ketones, ur: NEGATIVE mg/dL
Leukocytes,Ua: NEGATIVE
Nitrite: NEGATIVE
Protein, ur: NEGATIVE mg/dL
Specific Gravity, Urine: 1.013 (ref 1.005–1.030)
pH: 6 (ref 5.0–8.0)

## 2019-12-29 MED ORDER — SODIUM CHLORIDE 0.9 % IV SOLN
2400.0000 mg/m2 | INTRAVENOUS | Status: DC
  Administered 2019-12-29: 4200 mg via INTRAVENOUS
  Filled 2019-12-29: qty 84

## 2019-12-29 MED ORDER — LEUCOVORIN CALCIUM INJECTION 350 MG
400.0000 mg/m2 | Freq: Once | INTRAVENOUS | Status: AC
  Administered 2019-12-29: 700 mg via INTRAVENOUS
  Filled 2019-12-29: qty 25

## 2019-12-29 MED ORDER — SODIUM CHLORIDE 0.9 % IV SOLN: 8.0000 mg | Freq: Once | INTRAVENOUS | Status: DC

## 2019-12-29 MED ORDER — SODIUM CHLORIDE 0.9 % IV SOLN
5.0000 mg/kg | Freq: Once | INTRAVENOUS | Status: AC
  Administered 2019-12-29: 300 mg via INTRAVENOUS
  Filled 2019-12-29: qty 12

## 2019-12-29 MED ORDER — HEPARIN SOD (PORK) LOCK FLUSH 100 UNIT/ML IV SOLN
500.0000 [IU] | Freq: Once | INTRAVENOUS | Status: DC
  Filled 2019-12-29: qty 5

## 2019-12-29 MED ORDER — SODIUM CHLORIDE 0.9% FLUSH
10.0000 mL | Freq: Once | INTRAVENOUS | Status: AC
  Administered 2019-12-29: 10 mL via INTRAVENOUS
  Filled 2019-12-29: qty 10

## 2019-12-29 MED ORDER — ONDANSETRON HCL 4 MG/2ML IJ SOLN
8.0000 mg | Freq: Once | INTRAMUSCULAR | Status: AC
  Administered 2019-12-29: 8 mg via INTRAVENOUS
  Filled 2019-12-29: qty 4

## 2019-12-29 MED ORDER — SODIUM CHLORIDE 0.9 % IV SOLN
Freq: Once | INTRAVENOUS | Status: AC
  Filled 2019-12-29: qty 250

## 2019-12-29 MED ORDER — HYDROCODONE-ACETAMINOPHEN 5-325 MG PO TABS: 1.0000 | ORAL_TABLET | Freq: Three times a day (TID) | ORAL | 0 refills | Status: DC | PRN

## 2019-12-29 NOTE — Progress Notes (Signed)
Patient's final BP 180/81 HR 60. Dr. Rogue Bussing and team made aware. Okay to discharge home. Educated patient on s/s as to when to seek emergency care. Patient verbalizes understanding and denies any further questions or concerns.

## 2019-12-29 NOTE — Assessment & Plan Note (Addendum)
#  Right colon cancer stage IV; adenocarcinoma [s/p liver biopsy]-we will order NGS today.  Currentlly on FOLFOX + avastin. Poor tolerance to chemo [see below]  # proceed with 5FU with Avastin; HOLD oxaliplatin [see below]Labs today reviewed;  acceptable for treatment today.   #Extreme fatigue-secondary to oxaliplatin/poorly controlled blood sugars.  Hold oxaliplatin today.  Discontinue steroid premedication.  # Anemia- secondary- sec to chemo-hemoglobin on 11 stable  # Worsening back pain- no radiation; check MRI lumbar spine. Recommended hydrocodone  # Poorly controlled diabetes blood sugars-better controlled as per patients. STABLE>  #  Limited stage small cell cancer-likely lung primary.  S/p concurrent radiation x4 cycles.  CT scan August 20, 2019- stable.  # HTN- 140s/ 80-on Avastin.  Stable.  # COPD- STABLE>   I spoke at length with the patient's wife.  Regarding the patient's clinical status/plan of care.  Family agreement.    # DISPOSITION: # 5FU+ Avastin;HOLD oxliapltin today;  d-3- pump de-access # MRI Lumbar spine ASAP # follow up in 2 weeks-MD; labs-cbc/cmp/cea; UA; FOLFOX chemo+ Avastin; D-3 pump de-access- Dr.B

## 2019-12-29 NOTE — Progress Notes (Signed)
[place] NOTE  Patient Care Team: Laneta Simmers, NP as PCP - General (Nurse Practitioner) Telford Nab, RN as Registered Nurse Clent Jacks, RN as Oncology Nurse Navigator  CHIEF COMPLAINTS/PURPOSE OF CONSULTATION: Lung cancer/colon cancer   Oncology History Overview Note  # July 2020- SMALL CELL CA METASTATIC TO MEDIASTINAL LN [open Bx; Dr.Oaks]; TxN2M0; July 2nd 2020-PET- 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  MRI brain negative  # aug 17th 2020-carbo etoposide-RT [? 8/19]; #4 cycle carbo-Etop [finished dec 30th,2020]  #August 2020 iron deficient anemia-question etiology; IV Feraheme [no colonoscopy]  # OCT 2020- colo/ Cecal adeno ca; MRI liver- 1 cm enhancing lesion "metastasis" [Dr.Anna/Dr.Cannon]-difficulty biopsy.  November 02-2019 right hemicolectomy- STAGE III [pT3pN1 (2/13LN)]- HOLD adjuvant therapy. MMR- INTACT/LOW  #May 2021-liver biopsy-possible adenocarcinoma; colorectal origin.  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 [facility])   02/09/2019 - 07/12/2019 Chemotherapy   The patient had palonosetron (ALOXI) injection 0.25 mg, 0.25 mg, Intravenous,  Once, 4 of 4 cycles Administration: 0.25 mg (02/09/2019), 0.25 mg (03/03/2019), 0.25 mg (06/02/2019), 0.25 mg (06/22/2019) CARBOplatin (PARAPLATIN) 410 mg in sodium chloride 0.9 % 250 mL chemo infusion, 410 mg (100 % of original dose 414 mg), Intravenous,   Once, 4 of 4 cycles Dose modification:   (original dose 414 mg, Cycle 1) Administration: 410 mg (02/09/2019), 410 mg (03/03/2019), 410 mg (06/02/2019), 410 mg (06/22/2019) etoposide (VEPESID) 180 mg in sodium chloride 0.9 % 500 mL chemo infusion, 100 mg/m2 = 180 mg, Intravenous,  Once, 4 of 4 cycles Administration: 180 mg (02/09/2019), 180 mg (02/10/2019), 180 mg (02/11/2019), 180 mg (03/03/2019), 180 mg (03/04/2019), 180 mg (03/05/2019), 180 mg (06/02/2019), 180 mg (06/03/2019), 180 mg (06/04/2019), 180 mg (06/22/2019), 180 mg (06/23/2019), 180 mg (06/24/2019) fosaprepitant (EMEND) 150 mg, dexamethasone (DECADRON) 6 mg in sodium chloride 0.9 % 145 mL IVPB, , Intravenous,  Once, 2 of 2 cycles Administration:  (06/02/2019),  (06/22/2019)  for chemotherapy treatment.    Cancer of ascending colon ([place])  05/04/2019 Initial Diagnosis   Cancer of ascending colon ([place])   11/30/2019 -  Chemotherapy   The patient had dexamethasone (DECADRON) 4 MG tablet, 8 mg, Oral, Daily, 1 of 1 cycle, Start date: --, End date: -- palonosetron (ALOXI) injection 0.25 mg, 0.25 mg, Intravenous,  Once, 2 of 4 cycles Administration: 0.25 mg (11/30/2019), 0.25 mg (12/14/2019) leucovorin 700 mg in dextrose 5 % 250 mL infusion, 400 mg/m2 = 700 mg, Intravenous,  Once, 3 of 5 cycles Administration: 700 mg (11/30/2019), 700 mg (12/14/2019) oxaliplatin (ELOXATIN) 150 mg in dextrose 5 % 500 mL chemo infusion, 85 mg/m2 = 150 mg, Intravenous,  Once, 2 of 4 cycles Administration: 150 mg (12/14/2019) fluorouracil (ADRUCIL) 4,200 mg in sodium chloride 0.9 % 66 mL chemo infusion, 2,400 mg/m2 = 4,200 mg, Intravenous, 1 Day/Dose, 3 of 5 cycles Administration: 4,200 mg (11/30/2019), 4,200 mg (12/14/2019) bevacizumab-bvzr (ZIRABEV) 300 mg in sodium chloride 0.9 % 100 mL chemo infusion, 5 mg/kg = 300 mg, Intravenous,  Once, 3  of 5 cycles Administration: 300 mg (12/14/2019)  for chemotherapy treatment.     HISTORY OF PRESENTING ILLNESS:   Kevin Mckay 69 y.o.   male synchronous primaries-synchronous primaries limited stage small cell lung cancer s/p chemoradiation; and stage IV colon cancer-with metastasis to liver disease on FOLFOX plus Avastin.  Post chemotherapy, patient noted to have fatigue; leg cramps-severe; Blood sugars ~300.  Complaining of low back pain-radiating bilateral lower extremities.  Complains of constant tingling and numbness of extremities.  Overall felt very poorly.  As per the wife patient had been "sleeping all the time"  Review of Systems  Constitutional: Positive for malaise/fatigue and weight loss. Negative for chills, diaphoresis and fever.  HENT: Negative for nosebleeds and sore throat.   Eyes: Negative for double vision.  Respiratory: Positive for shortness of breath. Negative for hemoptysis and wheezing.   Cardiovascular: Positive for leg swelling. Negative for chest pain, palpitations and orthopnea.  Gastrointestinal: Negative for abdominal pain, blood in stool, constipation, diarrhea, heartburn, melena, nausea and vomiting.  Genitourinary: Negative for dysuria, frequency and urgency.  Musculoskeletal: Positive for back pain, joint pain and myalgias.  Skin: Negative.  Negative for itching and rash.  Neurological: Positive for tingling. Negative for dizziness, focal weakness and weakness.  Endo/Heme/Allergies: Does not bruise/bleed easily.  Psychiatric/Behavioral: Negative for depression. The patient is not nervous/anxious and does not have insomnia.      MEDICAL HISTORY:  Past Medical History:  Diagnosis Date  . Asthma   . Cancer ([place])    Metastatic small cell lung cancer  . Cerebral aneurysm   . Chronic painful diabetic neuropathy ([place])   . Depression   . Diabetes mellitus without complication ([place])   . Essential hypertension   . History of kidney stones   . Occasional tremors   . Tobacco use     SURGICAL HISTORY: Past Surgical History:  Procedure Laterality Date  . ABDOMINAL SURGERY     age 37. trauma  surgery due to forklift injury- liver and spleen  . COLONOSCOPY WITH PROPOFOL N/A 03/27/2019   Procedure: COLONOSCOPY WITH PROPOFOL;  Surgeon: Jonathon Bellows, MD;  Location: [facility] ENDOSCOPY;  Service: Gastroenterology;  Laterality: N/A;  . COLOSTOMY REVISION Right 05/04/2019   Procedure: COLON RESECTION RIGHT-right hemicolectomy-open;  Surgeon: Fredirick Maudlin, MD;  Location: [facility] ORS;  Service: General;  Laterality: Right;  . ESOPHAGOGASTRODUODENOSCOPY (EGD) WITH PROPOFOL N/A 03/27/2019   Procedure: ESOPHAGOGASTRODUODENOSCOPY (EGD) WITH PROPOFOL;  Surgeon: Jonathon Bellows, MD;  [facility] END[facility]COPY;  Service: Gastroenterology;  Laterality: N/A;  . IR GENERIC HISTORICAL  05/31/2016   IR ANGIO VERTEBRAL SEL VERTEBRAL BILAT MOD SED 05/31/2016 Consuella Lose, MD [facility]-INTERV RAD  . IR GENERIC HISTORICAL  05/31/2016   IR ANGIO INTRA EXTRACRAN SEL INTERNAL CAROTID BILAT MOD SED 05/31/2016 Consuella Lose, MD [facility]-INTERV RAD  . PORTACATH PLACEMENT Right 02/04/2019   Procedure: INSERTION PORT-A-CATH;  Surgeon: [name], MD;  Location: [facility] ORS;  Service: General;  Laterality: Right;  . THORACOTOMY Left 01/22/2019   Procedure: PRE OP BRONCH LEFT ANTERIOR THORACOTOMY WITH BIOPSY OF MEDIASTINAL MASS;  Surgeon: [name], MD;  Location: [facility] ORS;  Service: General;  Laterality: Left;    SOCIAL HISTORY: Social History   Socioeconomic History  . Marital status: Married    Spouse name: Not on file  . Number of children: Not on file  . Years of education: Not on file  . Highest education level: Not on file  Occupational History  . Not on file  Tobacco Use  . Smoking  status: Current Every Day Smoker    Packs/day: 0.50    Years: 50.00    Pack years: 25.00    Types: Cigarettes  . Smokeless tobacco: Never Used  Vaping Use  . Vaping Use: Every day  . Substances: Nicotine  Substance and Sexual Activity  . Alcohol use: No  . Drug use: Yes    Types: Barbituates, Marijuana  . Sexual activity: Not on  file  Other Topics Concern  . Not on file  Social History Narrative   Lives in [place]; wife/ son/grandson [custody]; smoker; vending business; hx of alcoholism- quit at 15.    Social Determinants of Health   Financial Resource Strain: Low Risk   . Difficulty of Paying Living Expenses: Not hard at all  Food Insecurity: No Food Insecurity  . Worried About Charity fundraiser in the Last Year: Never true  . Ran Out of Food in the Last Year: Never true  Transportation Needs: No Transportation Needs  . Lack of Transportation (Medical): No  . Lack of Transportation (Non-Medical): No  Physical Activity: Inactive  . Days of Exercise per Week: 0 days  . Minutes of Exercise per Session: 0 min  Stress: No Stress Concern Present  . Feeling of Stress : Not at all  Social Connections: Unknown  . Frequency of Communication with Friends and Family: Patient refused  . Frequency of Social Gatherings with Friends and Family: Patient refused  . Attends Religious Services: Patient refused  . Active Member of Clubs or Organizations: Patient refused  . Attends Archivist Meetings: Patient refused  . Marital Status: Patient refused  Intimate Partner Violence: Not At Risk  . Fear of Current or Ex-Partner: No  . Emotionally Abused: No  . Physically Abused: No  . Sexually Abused: No    FAMILY HISTORY: Family History  Problem Relation Age of Onset  . Lymphoma Father   . Liver cancer Paternal Uncle   . Lung cancer Paternal Uncle   . Lung cancer Maternal Uncle     ALLERGIES:  is allergic to ferumoxytol, amoxicillin, and codeine.  MEDICATIONS:  Current Outpatient Medications  Medication Sig Dispense Refill  . amLODipine (NORVASC) 10 MG tablet Take 10 mg by mouth every morning.     . budesonide-formoterol (SYMBICORT) 160-4.5 MCG/ACT inhaler Inhale 2 puffs into the lungs 2 (two) times daily.    . DULoxetine (CYMBALTA) 30 MG capsule Take 30 mg by mouth every morning.     . ferrous  sulfate 324 (65 Fe) MG TBEC Take 324 mg by mouth daily.     Marland Kitchen gabapentin (NEURONTIN) 300 MG capsule Take 600 mg by mouth 3 (three) times daily.     . hydrochlorothiazide (HYDRODIURIL) 25 MG tablet Take 25 mg by mouth daily.     . insulin glargine (LANTUS) 100 UNIT/ML injection Inject into the skin daily. Units based By sliding scale    . Ipratropium-Albuterol (COMBIVENT) 20-100 MCG/ACT AERS respimat Inhale into the lungs.    . lidocaine-prilocaine (EMLA) cream Apply 1 application topically as needed. 30-45 mins prior to port access. 30 g 0  . linaclotide (LINZESS) 145 MCG CAPS capsule Take 145 mcg by mouth daily before breakfast.    . lovastatin (MEVACOR) 20 MG tablet Take 20 mg by mouth every evening.    . metFORMIN (GLUCOPHAGE) 1000 MG tablet Take 1,000 mg by mouth 2 (two) times daily with a meal.     . metFORMIN (GLUCOPHAGE-XR) 500 MG 24 hr tablet Take 1,000 mg by  mouth 2 (two) times daily.    Marland Kitchen omeprazole (PRILOSEC) 40 MG capsule Take 1 capsule (40 mg total) by mouth daily. (Patient taking differently: Take 40 mg by mouth every morning. ) 90 capsule 1  . ondansetron (ZOFRAN) 8 MG tablet Take 1 tablet (8 mg total) by mouth every 8 (eight) hours as needed for nausea or vomiting (start 3 days; after chemo). 40 tablet 1  . polyethylene glycol powder (MIRALAX) 17 GM/SCOOP powder Mix full container in 64 ounces of Gatorade or other clear liquid. NO RED Liquids 238 g 0  . prazosin (MINIPRESS) 1 MG capsule Take 1 mg by mouth at bedtime.    . psyllium (METAMUCIL) 58.6 % powder Take 1 packet by mouth daily.    Marland Kitchen pyridOXINE (VITAMIN B-6) 100 MG tablet Take 100 mg by mouth daily.    . sitaGLIPtin (JANUVIA) 25 MG tablet Take 25 mg by mouth daily.    . vitamin B-12 (CYANOCOBALAMIN) 100 MCG tablet Take 100 mcg by mouth daily.    Marland Kitchen HYDROcodone-acetaminophen (NORCO/VICODIN) 5-325 MG tablet Take 1 tablet by mouth every 8 (eight) hours as needed for moderate pain. 45 tablet 0  . Multiple Vitamins-Minerals  (MULTIVITAMIN WITH MINERALS) tablet Take 1 tablet by mouth daily. (Patient not taking: Reported on 12/25/2019)     No current facility-administered medications for this visit.   Facility-Administered Medications Ordered in Other Visits  Medication Dose Route Frequency Provider Last Rate Last Admin  . bevacizumab-bvzr (ZIRABEV) 300 mg in sodium chloride 0.9 % 100 mL chemo infusion  5 mg/kg (Treatment Plan Recorded) Intravenous Once Charlaine Dalton R, MD      . fluorouracil (ADRUCIL) 4,200 mg in sodium chloride 0.9 % 66 mL chemo infusion  2,400 mg/m2 (Treatment Plan Recorded) Intravenous 1 day or 1 dose Charlaine Dalton R, MD      . heparin lock flush 100 unit/mL  500 Units Intravenous Once Charlaine Dalton R, MD      . leucovorin 700 mg in dextrose 5 % 250 mL infusion  400 mg/m2 (Treatment Plan Recorded) Intravenous Once Cammie Sickle, MD          .  PHYSICAL EXAMINATION: ECOG PERFORMANCE STATUS: 0 - Asymptomatic  Vitals:   12/29/19 0823  BP: (!) 141/72  Pulse: 69  Resp: 16  Temp: (!) 96.7 F (35.9 C)  SpO2: 100%   Filed Weights   12/29/19 0823  Weight: 142 lb 3.2 oz (64.5 kg)    Physical Exam HENT:     Head: Normocephalic and atraumatic.     Mouth/Throat:     Pharynx: No oropharyngeal exudate.  Eyes:     Pupils: Pupils are equal, round, and reactive to light.  Cardiovascular:     Rate and Rhythm: Normal rate and regular rhythm.  Pulmonary:     Effort: No respiratory distress.     Breath sounds: No wheezing.  Abdominal:     General: Bowel sounds are normal. There is no distension.     Palpations: Abdomen is soft. There is no mass.     Tenderness: There is no abdominal tenderness. There is no guarding or rebound.     Comments: Abdominal incision well-healed.  Musculoskeletal:        General: No tenderness. Normal range of motion.     Cervical back: Normal range of motion and neck supple.  Skin:    General: Skin is warm.  Neurological:     Mental  Status: He is alert and oriented to person, place,  and time.  Psychiatric:        Mood and Affect: Affect normal.     LABORATORY DATA:  I have reviewed the data as listed Lab Results  Component Value Date   WBC 8.1 12/29/2019   HGB 12.7 (L) 12/29/2019   HCT 37.5 (L) 12/29/2019   MCV 80.3 12/29/2019   PLT 147 (L) 12/29/2019   Recent Labs    11/30/19 0840 12/14/19 0814 12/29/19 0808  NA 137 135 134*  K 3.9 4.3 3.8  CL 100 97* 96*  CO2 '28 29 28  ' GLUCOSE 224* 164* 227*  BUN '22 23 15  ' CREATININE 1.08 1.07 0.91  CALCIUM 9.1 8.6* 9.2  GFRNONAA >60 >60 >60  GFRAA >60 >60 >60  PROT 6.9 6.7 7.2  ALBUMIN 3.6 3.5 3.7  AST '18 18 26  ' ALT '18 20 24  ' ALKPHOS 63 67 72  BILITOT 0.5 0.5 0.4    RADIOGRAPHIC STUDIES: I have personally reviewed the radiological images as listed and agreed with the findings in the report. DG Foot Complete Right  Result Date: 12/02/2019 CLINICAL DATA:  Right foot infection/ulceration.  Diabetes mellitus EXAM: RIGHT FOOT COMPLETE - 3+ VIEW COMPARISON:  None. FINDINGS: Frontal, with oblique, and lateral views were obtained. Bones are osteoporotic. No fracture or dislocation. There is no appreciable joint space narrowing or erosion. No evident bony destruction. No soft tissue air or radiopaque foreign body. IMPRESSION: No bony destruction or erosion. No fracture or dislocation. Bones appear somewhat osteoporotic. No soft tissue air evident. Electronically Signed   By: Lowella Grip III M.D.   On: 12/02/2019 08:10    ASSESSMENT & PLAN:   Cancer of ascending colon ([place]) #Right colon cancer stage IV; adenocarcinoma [s/p liver biopsy]-we will order NGS today.  Currentlly on FOLFOX + avastin. Poor tolerance to chemo [see below]  # proceed with 5FU with Avastin; HOLD oxaliplatin [see below]Labs today reviewed;  acceptable for treatment today.   #Extreme fatigue-secondary to oxaliplatin/poorly controlled blood sugars.  Hold oxaliplatin today.  Discontinue  steroid premedication.  # Anemia- secondary- sec to chemo-hemoglobin on 11 stable  # Worsening back pain- no radiation; check MRI lumbar spine. Recommended hydrocodone  # Poorly controlled diabetes blood sugars-better controlled as per patients. STABLE>  #  Limited stage small cell cancer-likely lung primary.  S/p concurrent radiation x4 cycles.  CT scan August 20, 2019- stable.  # HTN- 140s/ 80-on Avastin.  Stable.  # COPD- STABLE>   I spoke at length with the patient's wife.  Regarding the patient's clinical status/plan of care.  Family agreement.    # DISPOSITION: # 5FU+ Avastin;HOLD oxliapltin today;  d-3- pump de-access # MRI Lumbar spine ASAP # follow up in 2 weeks-MD; labs-cbc/cmp/cea; UA; FOLFOX chemo+ Avastin; D-3 pump de-access- Dr.B   All questions were answered. The patient knows to call the clinic with any problems, questions or concerns.    Cammie Sickle, MD 12/29/2019 9:48 AM

## 2019-12-29 NOTE — Progress Notes (Signed)
Foundation one submitted per verbal order from Dr. Rogue Bussing.

## 2019-12-30 LAB — CEA: CEA: 5 ng/mL — ABNORMAL HIGH (ref 0.0–4.7)

## 2019-12-31 ENCOUNTER — Other Ambulatory Visit: Payer: Self-pay

## 2019-12-31 ENCOUNTER — Inpatient Hospital Stay: Payer: BC Managed Care – PPO

## 2019-12-31 DIAGNOSIS — Z7189 Other specified counseling: Secondary | ICD-10-CM

## 2019-12-31 DIAGNOSIS — Z5111 Encounter for antineoplastic chemotherapy: Secondary | ICD-10-CM | POA: Diagnosis not present

## 2019-12-31 DIAGNOSIS — C182 Malignant neoplasm of ascending colon: Secondary | ICD-10-CM

## 2019-12-31 MED ORDER — HEPARIN SOD (PORK) LOCK FLUSH 100 UNIT/ML IV SOLN
500.0000 [IU] | Freq: Once | INTRAVENOUS | Status: AC | PRN
  Administered 2019-12-31: 500 [IU]
  Filled 2019-12-31: qty 5

## 2019-12-31 MED ORDER — SODIUM CHLORIDE 0.9% FLUSH
10.0000 mL | INTRAVENOUS | Status: DC | PRN
  Administered 2019-12-31: 10 mL
  Filled 2019-12-31: qty 10

## 2020-01-06 ENCOUNTER — Encounter: Payer: Self-pay | Admitting: Internal Medicine

## 2020-01-07 ENCOUNTER — Encounter: Payer: Self-pay | Admitting: Internal Medicine

## 2020-01-12 ENCOUNTER — Other Ambulatory Visit: Payer: BC Managed Care – PPO

## 2020-01-12 ENCOUNTER — Encounter: Payer: Self-pay | Admitting: Internal Medicine

## 2020-01-12 ENCOUNTER — Ambulatory Visit: Payer: BC Managed Care – PPO

## 2020-01-12 ENCOUNTER — Other Ambulatory Visit: Payer: Self-pay

## 2020-01-12 ENCOUNTER — Ambulatory Visit
Admission: RE | Admit: 2020-01-12 | Discharge: 2020-01-12 | Disposition: A | Discharge status: Home / Self Care | Payer: BC Managed Care – PPO | Source: Ambulatory Visit | Attending: Internal Medicine | Admitting: Internal Medicine

## 2020-01-12 ENCOUNTER — Ambulatory Visit: Payer: BC Managed Care – PPO | Admitting: Internal Medicine

## 2020-01-12 DIAGNOSIS — M545 Low back pain, unspecified: Secondary | ICD-10-CM

## 2020-01-12 DIAGNOSIS — M79604 Pain in right leg: Secondary | ICD-10-CM

## 2020-01-12 DIAGNOSIS — C182 Malignant neoplasm of ascending colon: Secondary | ICD-10-CM | POA: Diagnosis not present

## 2020-01-12 MED ORDER — GADOBUTROL 1 MMOL/ML IV SOLN
6.0000 mL | Freq: Once | INTRAVENOUS | Status: AC | PRN
  Administered 2020-01-12: 6 mL via INTRAVENOUS

## 2020-01-13 ENCOUNTER — Inpatient Hospital Stay (HOSPITAL_BASED_OUTPATIENT_CLINIC_OR_DEPARTMENT_OTHER): Payer: BC Managed Care – PPO | Admitting: Internal Medicine

## 2020-01-13 ENCOUNTER — Inpatient Hospital Stay: Payer: BC Managed Care – PPO

## 2020-01-13 ENCOUNTER — Encounter: Payer: Self-pay | Admitting: Internal Medicine

## 2020-01-13 DIAGNOSIS — C182 Malignant neoplasm of ascending colon: Secondary | ICD-10-CM

## 2020-01-13 DIAGNOSIS — C781 Secondary malignant neoplasm of mediastinum: Secondary | ICD-10-CM

## 2020-01-13 DIAGNOSIS — Z5111 Encounter for antineoplastic chemotherapy: Secondary | ICD-10-CM | POA: Diagnosis not present

## 2020-01-13 DIAGNOSIS — Z95828 Presence of other vascular implants and grafts: Secondary | ICD-10-CM

## 2020-01-13 DIAGNOSIS — Z7189 Other specified counseling: Secondary | ICD-10-CM

## 2020-01-13 LAB — COMPREHENSIVE METABOLIC PANEL
ALT: 16 U/L (ref 0–44)
AST: 18 U/L (ref 15–41)
Albumin: 3.7 g/dL (ref 3.5–5.0)
Alkaline Phosphatase: 73 U/L (ref 38–126)
Anion gap: 9 (ref 5–15)
BUN: 16 mg/dL (ref 8–23)
CO2: 28 mmol/L (ref 22–32)
Calcium: 8.7 mg/dL — ABNORMAL LOW (ref 8.9–10.3)
Chloride: 98 mmol/L (ref 98–111)
Creatinine, Ser: 0.92 mg/dL (ref 0.61–1.24)
GFR calc Af Amer: >60 mL/min (ref >60)
GFR calc non Af Amer: >60 mL/min (ref >60)
Glucose, Bld: 202 mg/dL — ABNORMAL HIGH (ref 70–99)
Potassium: 4.1 mmol/L (ref 3.5–5.1)
Sodium: 135 mmol/L (ref 135–145)
Total Bilirubin: 0.5 mg/dL (ref 0.3–1.2)
Total Protein: 7 g/dL (ref 6.5–8.1)

## 2020-01-13 LAB — CBC WITH DIFFERENTIAL/PLATELET
Abs Immature Granulocytes: 0.02 10*3/uL (ref 0.00–0.07)
Basophils Absolute: 0.1 10*3/uL (ref 0.0–0.1)
Basophils Relative: 1 %
Eosinophils Absolute: 0.5 10*3/uL (ref 0.0–0.5)
Eosinophils Relative: 6 %
HCT: 35.3 % — ABNORMAL LOW (ref 39.0–52.0)
Hemoglobin: 12.1 g/dL — ABNORMAL LOW (ref 13.0–17.0)
Immature Granulocytes: 0 %
Lymphocytes Relative: 23 %
Lymphs Abs: 1.8 10*3/uL (ref 0.7–4.0)
MCH: 27.5 pg (ref 26.0–34.0)
MCHC: 34.3 g/dL (ref 30.0–36.0)
MCV: 80.2 fL (ref 80.0–100.0)
Monocytes Absolute: 0.8 10*3/uL (ref 0.1–1.0)
Monocytes Relative: 10 %
Neutro Abs: 4.7 10*3/uL (ref 1.7–7.7)
Neutrophils Relative %: 60 %
Platelets: 150 10*3/uL (ref 150–400)
RBC: 4.4 MIL/uL (ref 4.22–5.81)
RDW: 17.7 % — ABNORMAL HIGH (ref 11.5–15.5)
WBC: 7.9 10*3/uL (ref 4.0–10.5)
nRBC: 0 % (ref 0.0–0.2)

## 2020-01-13 LAB — URINALYSIS, COMPLETE (UACMP) WITH MICROSCOPIC
Bacteria, UA: NONE SEEN
Bilirubin Urine: NEGATIVE
Glucose, UA: >=500 mg/dL — AB
Hgb urine dipstick: NEGATIVE
Ketones, ur: NEGATIVE mg/dL
Nitrite: NEGATIVE
Protein, ur: NEGATIVE mg/dL
Specific Gravity, Urine: 1.007 (ref 1.005–1.030)
pH: 7 (ref 5.0–8.0)

## 2020-01-13 MED ORDER — SODIUM CHLORIDE 0.9 % IV SOLN
2400.0000 mg/m2 | INTRAVENOUS | Status: DC
  Administered 2020-01-13: 4200 mg via INTRAVENOUS
  Filled 2020-01-13: qty 84

## 2020-01-13 MED ORDER — SODIUM CHLORIDE 0.9 % IV SOLN
5.0000 mg/kg | Freq: Once | INTRAVENOUS | Status: AC
  Administered 2020-01-13: 300 mg via INTRAVENOUS
  Filled 2020-01-13: qty 12

## 2020-01-13 MED ORDER — DEXAMETHASONE SODIUM PHOSPHATE 10 MG/ML IJ SOLN
4.0000 mg | Freq: Once | INTRAMUSCULAR | Status: AC
  Administered 2020-01-13: 4 mg via INTRAVENOUS
  Filled 2020-01-13: qty 1

## 2020-01-13 MED ORDER — PALONOSETRON HCL INJECTION 0.25 MG/5ML
0.2500 mg | Freq: Once | INTRAVENOUS | Status: AC
  Administered 2020-01-13: 0.25 mg via INTRAVENOUS
  Filled 2020-01-13: qty 5

## 2020-01-13 MED ORDER — SODIUM CHLORIDE 0.9% FLUSH
10.0000 mL | INTRAVENOUS | Status: DC | PRN
  Administered 2020-01-13: 10 mL via INTRAVENOUS
  Filled 2020-01-13: qty 10

## 2020-01-13 MED ORDER — LEUCOVORIN CALCIUM INJECTION 350 MG
400.0000 mg/m2 | Freq: Once | INTRAVENOUS | Status: AC
  Administered 2020-01-13: 700 mg via INTRAVENOUS
  Filled 2020-01-13: qty 25

## 2020-01-13 MED ORDER — SODIUM CHLORIDE 0.9 % IV SOLN
Freq: Once | INTRAVENOUS | Status: AC
  Filled 2020-01-13: qty 250

## 2020-01-13 MED ORDER — SODIUM CHLORIDE 0.9 % IV SOLN
4.0000 mg | Freq: Once | INTRAVENOUS | Status: DC
  Filled 2020-01-13: qty 0.4

## 2020-01-13 MED ORDER — DEXTROSE 5 % IV SOLN
Freq: Once | INTRAVENOUS | Status: AC
  Filled 2020-01-13: qty 250

## 2020-01-13 MED ORDER — OXALIPLATIN CHEMO INJECTION 100 MG/20ML
68.0000 mg/m2 | Freq: Once | INTRAVENOUS | Status: AC
  Administered 2020-01-13: 120 mg via INTRAVENOUS
  Filled 2020-01-13: qty 20

## 2020-01-13 NOTE — Assessment & Plan Note (Addendum)
#  Right colon cancer stage IV; adenocarcinoma [s/p liver biopsy]-Currentlly on FOLFOX + avastin.  Foundation 1- WILD RAS mutations.  # proceed with 5FU with Avastin; 80% oxaliplatin [see below-severe fatigue] Labs today reviewed;  acceptable for treatment today.   #Extreme fatigue-secondary to oxaliplatin/poorly controlled blood sugars- STABLE; will restart at 80% oxaliplatin today.  # Anemia- secondary- sec to chemo-hemoglobin on 11-12-stable  # Worsening back pain- no radiation;MRI lumbar spine- July 2021-NEG for mets; severe arthritic changes; spinal canal stenosis. Declines ortho evaluation.   # Poorly controlled diabetes blood sugars-better controlled as per patients.; Stable- BG this AM 202.   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  S/p concurrent radiation x4 cycles.  CT scan August 20, 2019- stable.  # HTN- 140s/ 80-on Avastin.  Stable.  # COPD- STABLE>   # DISPOSITION: # FOLFOX + avastin;  d-3- pump de-access # follow up in 2 weeks-MD; labs-cbc/cmp/cea; UA; FOLFOX chemo+ Avastin; D-3 pump de-access- Dr.B

## 2020-01-13 NOTE — Progress Notes (Signed)
[place] NOTE  Patient Care Team: Laneta Simmers, NP as PCP - General (Nurse Practitioner) Telford Nab, RN as Registered Nurse Clent Jacks, RN as Oncology Nurse Navigator  CHIEF COMPLAINTS/PURPOSE OF CONSULTATION: Lung cancer/colon cancer   Oncology History Overview Note  # July 2020- SMALL CELL CA METASTATIC TO MEDIASTINAL LN [open Bx; Dr.Oaks]; TxN2M0; July 2nd 2020-PET- 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  MRI brain negative  # aug 17th 2020-carbo etoposide-RT [? 8/19]; #4 cycle carbo-Etop [finished dec 30th,2020]  #August 2020 iron deficient anemia-question etiology; IV Feraheme [no colonoscopy]  # OCT 2020- colo/ Cecal adeno ca; MRI liver- 1 cm enhancing lesion "metastasis" [Dr.Anna/Dr.Cannon]-difficulty biopsy.  November 02-2019 right hemicolectomy- STAGE III [pT3pN1 (2/13LN)]- HOLD adjuvant therapy. MMR- INTACT/LOW  #May 2021-liver biopsy-possible adenocarcinoma; colorectal origin.  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 [facility])   02/09/2019 - 07/12/2019 Chemotherapy   The patient had palonosetron (ALOXI) injection 0.25 mg, 0.25 mg, Intravenous,  Once, 4 of 4 cycles Administration: 0.25 mg (02/09/2019), 0.25 mg (03/03/2019), 0.25 mg (06/02/2019), 0.25 mg (06/22/2019) CARBOplatin (PARAPLATIN) 410 mg in sodium chloride 0.9 % 250 mL  chemo infusion, 410 mg (100 % of original dose 414 mg), Intravenous,  Once, 4 of 4 cycles Dose modification:   (original dose 414 mg, Cycle 1) Administration: 410 mg (02/09/2019), 410 mg (03/03/2019), 410 mg (06/02/2019), 410 mg (06/22/2019) etoposide (VEPESID) 180 mg in sodium chloride 0.9 % 500 mL chemo infusion, 100 mg/m2 = 180 mg, Intravenous,  Once, 4 of 4 cycles Administration: 180 mg (02/09/2019), 180 mg (02/10/2019), 180 mg (02/11/2019), 180 mg (03/03/2019), 180 mg (03/04/2019), 180 mg (03/05/2019), 180 mg (06/02/2019), 180 mg (06/03/2019), 180 mg (06/04/2019), 180 mg (06/22/2019), 180 mg (06/23/2019), 180 mg (06/24/2019) fosaprepitant (EMEND) 150 mg, dexamethasone (DECADRON) 6 mg in sodium chloride 0.9 % 145 mL IVPB, , Intravenous,  Once, 2 of 2 cycles Administration:  (06/02/2019),  (06/22/2019)  for chemotherapy treatment.    Cancer of ascending colon ([place])  05/04/2019 Initial Diagnosis   Cancer of ascending colon ([place])   11/30/2019 -  Chemotherapy   The patient had dexamethasone (DECADRON) 4 MG tablet, 8 mg, Oral, Daily, 1 of 1 cycle, Start date: --, End date: -- palonosetron (ALOXI) injection 0.25 mg, 0.25 mg, Intravenous,  Once, 3 of 5 cycles Administration: 0.25 mg (11/30/2019), 0.25 mg (01/13/2020), 0.25 mg (12/14/2019) leucovorin 700 mg in dextrose 5 % 250 mL infusion, 400 mg/m2 = 700 mg, Intravenous,  Once, 4 of 6 cycles Administration: 700 mg (11/30/2019), 700 mg (12/29/2019), 700 mg (01/13/2020), 700 mg (12/14/2019) oxaliplatin (ELOXATIN) 150 mg in dextrose 5 % 500 mL chemo infusion, 85 mg/m2 = 150 mg, Intravenous,  Once, 3 of 5 cycles Dose modification: 68 mg/m2 (original dose 85 mg/m2, Cycle 4, Reason: Provider Judgment) Administration: 120 mg (01/13/2020), 150 mg (12/14/2019) fluorouracil (ADRUCIL) 4,200 mg in sodium chloride 0.9 % 66 mL chemo infusion, 2,400 mg/m2 = 4,200 mg,  Intravenous, 1 Day/Dose, 4 of 6 cycles Administration: 4,200 mg (11/30/2019), 4,200 mg (12/29/2019), 4,200 mg (01/13/2020), 4,200  mg (12/14/2019) bevacizumab-bvzr (ZIRABEV) 300 mg in sodium chloride 0.9 % 100 mL chemo infusion, 5 mg/kg = 300 mg, Intravenous,  Once, 4 of 6 cycles Administration: 300 mg (12/29/2019), 300 mg (01/13/2020), 300 mg (12/14/2019)  for chemotherapy treatment.     HISTORY OF PRESENTING ILLNESS:   Kevin Mckay 69 y.o.  male synchronous primaries-synchronous primaries limited stage small cell lung cancer s/p chemoradiation; and stage IV colon cancer-with metastasis to liver currently on FOLFOX plus Avastin is here for follow-up.  In the interim patient MRI of his back for his worsening back pain.  At last visit oxaliplatin was held because of previous poor tolerance because of extreme fatigue elevated blood sugars.   Patient seems to be tolerating chemotherapy 5-FU plus Avastin fairly well otherwise.  Fatigue is improved.  Blood sugars are better controlled in 200s to 250s at this time.  He continues to have back pain-which is currently improved on hydrocodone.   Review of Systems  Constitutional: Positive for malaise/fatigue and weight loss. Negative for chills, diaphoresis and fever.  HENT: Negative for nosebleeds and sore throat.   Eyes: Negative for double vision.  Respiratory: Positive for shortness of breath. Negative for hemoptysis and wheezing.   Cardiovascular: Positive for leg swelling. Negative for chest pain, palpitations and orthopnea.  Gastrointestinal: Negative for abdominal pain, blood in stool, constipation, diarrhea, heartburn, melena, nausea and vomiting.  Genitourinary: Negative for dysuria, frequency and urgency.  Musculoskeletal: Positive for back pain, joint pain and myalgias.  Skin: Negative.  Negative for itching and rash.  Neurological: Positive for tingling. Negative for dizziness, focal weakness and weakness.  Endo/Heme/Allergies: Does not bruise/bleed easily.  Psychiatric/Behavioral: Negative for depression. The patient is not nervous/anxious and does not have  insomnia.      MEDICAL HISTORY:  Past Medical History:  Diagnosis Date  . Asthma   . Cancer ([place])    Metastatic small cell lung cancer  . Cerebral aneurysm   . Chronic painful diabetic neuropathy ([place])   . Depression   . Diabetes mellitus without complication ([place])   . Essential hypertension   . History of kidney stones   . Occasional tremors   . Tobacco use     SURGICAL HISTORY: Past Surgical History:  Procedure Laterality Date  . ABDOMINAL SURGERY     age 89. trauma surgery due to forklift injury- liver and spleen  . COLONOSCOPY WITH PROPOFOL N/A 03/27/2019   Procedure: COLONOSCOPY WITH PROPOFOL;  Surgeon: Jonathon Bellows, MD;  Location: [facility] ENDOSCOPY;  Service: Gastroenterology;  Laterality: N/A;  . COLOSTOMY REVISION Right 05/04/2019   Procedure: COLON RESECTION RIGHT-right hemicolectomy-open;  Surgeon: Fredirick Maudlin, MD;  Location: [facility] ORS;  Service: General;  Laterality: Right;  . ESOPHAGOGASTRODUODENOSCOPY (EGD) WITH PROPOFOL N/A 03/27/2019   Procedure: ESOPHAGOGASTRODUODENOSCOPY (EGD) WITH PROPOFOL;  Surgeon: Jonathon Bellows, MD;  Location: [facility] ENDOSCOPY;  Service: Gastroenterology;  Laterality: N/A;  . IR GENERIC HISTORICAL  05/31/2016   IR ANGIO VERTEBRAL SEL VERTEBRAL BILAT MOD SED 05/31/2016 Consuella Lose, MD [facility]-INTERV RAD  . IR GENERIC HISTORICAL  05/31/2016   IR ANGIO INTRA EXTRACRAN SEL INTERNAL CAROTID BILAT MOD SED 05/31/2016 Consuella Lose, MD [facility]-INTERV RAD  . PORTACATH PLACEMENT Right 02/04/2019   Procedure: INSERTION PORT-A-CATH;  Surgeon: [name], MD;  Location: [facility] ORS;  Service: General;  Laterality: Right;  . THORACOTOMY Left 01/22/2019   Procedure: PRE OP BRONCH LEFT ANTERIOR THORACOTOMY WITH  BIOPSY OF MEDIASTINAL MASS;  Surgeon: [name], MD;  Location: [facility] ORS;  Service: General;  Laterality: Left;    SOCIAL HISTORY: Social History   Socioeconomic History  . Marital status: Married    Spouse name: Not on file  . Number of children: Not  on file  . Years of education: Not on file  . Highest education level: Not on file  Occupational History  . Not on file  Tobacco Use  . Smoking status: Current Every Day Smoker    Packs/day: 0.50    Years: 50.00    Pack years: 25.00    Types: Cigarettes  . Smokeless tobacco: Never Used  Vaping Use  . Vaping Use: Every day  . Substances: Nicotine  Substance and Sexual Activity  . Alcohol use: No  . Drug use: Yes    Types: Barbituates, Marijuana  . Sexual activity: Not on file  Other Topics Concern  . Not on file  Social History Narrative   Lives in [place]; wife/ son/grandson [custody]; smoker; vending business; hx of alcoholism- quit at 27.    Social Determinants of Health   Financial Resource Strain: Low Risk   . Difficulty of Paying Living Expenses: Not hard at all  Food Insecurity: No Food Insecurity  . Worried About Charity fundraiser in the Last Year: Never true  . Ran Out of Food in the Last Year: Never true  Transportation Needs: No Transportation Needs  . Lack of Transportation (Medical): No  . Lack of Transportation (Non-Medical): No  Physical Activity: Inactive  . Days of Exercise per Week: 0 days  . Minutes of Exercise per Session: 0 min  Stress: No Stress Concern Present  . Feeling of Stress : Not at all  Social Connections: Unknown  . Frequency of Communication with Friends and Family: Patient refused  . Frequency of Social Gatherings with Friends and Family: Patient refused  . Attends Religious Services: Patient refused  . Active Member of Clubs or Organizations: Patient refused  . Attends Archivist Meetings: Patient refused  . Marital Status: Patient refused  Intimate Partner Violence: Not At Risk  . Fear of Current or Ex-Partner: No  . Emotionally Abused: No  . Physically Abused: No  . Sexually Abused: No    FAMILY HISTORY: Family History  Problem Relation Age of Onset  . Lymphoma Father   . Liver cancer Paternal Uncle   . Lung  cancer Paternal Uncle   . Lung cancer Maternal Uncle     ALLERGIES:  is allergic to ferumoxytol, amoxicillin, and codeine.  MEDICATIONS:  Current Outpatient Medications  Medication Sig Dispense Refill  . amLODipine (NORVASC) 10 MG tablet Take 10 mg by mouth every morning.     . budesonide-formoterol (SYMBICORT) 160-4.5 MCG/ACT inhaler Inhale 2 puffs into the lungs 2 (two) times daily.    . DULoxetine (CYMBALTA) 30 MG capsule Take 30 mg by mouth every morning.     . ferrous sulfate 324 (65 Fe) MG TBEC Take 324 mg by mouth daily.     Marland Kitchen gabapentin (NEURONTIN) 300 MG capsule Take 600 mg by mouth 3 (three) times daily.     . hydrochlorothiazide (HYDRODIURIL) 25 MG tablet Take 25 mg by mouth daily.     Marland Kitchen HYDROcodone-acetaminophen (NORCO/VICODIN) 5-325 MG tablet Take 1 tablet by mouth every 8 (eight) hours as needed for moderate pain. 45 tablet 0  . insulin glargine (LANTUS) 100 UNIT/ML injection Inject into the skin daily. Units based By sliding scale    .  Ipratropium-Albuterol (COMBIVENT) 20-100 MCG/ACT AERS respimat Inhale into the lungs.    . lidocaine-prilocaine (EMLA) cream Apply 1 application topically as needed. 30-45 mins prior to port access. 30 g 0  . linaclotide (LINZESS) 145 MCG CAPS capsule Take 145 mcg by mouth daily before breakfast.    . lovastatin (MEVACOR) 20 MG tablet Take 20 mg by mouth every evening.    . metFORMIN (GLUCOPHAGE) 1000 MG tablet Take 1,000 mg by mouth 2 (two) times daily with a meal.     . metFORMIN (GLUCOPHAGE-XR) 500 MG 24 hr tablet Take 1,000 mg by mouth 2 (two) times daily.    . ondansetron (ZOFRAN) 8 MG tablet Take 1 tablet (8 mg total) by mouth every 8 (eight) hours as needed for nausea or vomiting (start 3 days; after chemo). 40 tablet 1  . polyethylene glycol powder (MIRALAX) 17 GM/SCOOP powder Mix full container in 64 ounces of Gatorade or other clear liquid. NO RED Liquids 238 g 0  . prazosin (MINIPRESS) 1 MG capsule Take 1 mg by mouth at bedtime.    .  psyllium (METAMUCIL) 58.6 % powder Take 1 packet by mouth daily.    Marland Kitchen pyridOXINE (VITAMIN B-6) 100 MG tablet Take 100 mg by mouth daily.    . sitaGLIPtin (JANUVIA) 25 MG tablet Take 25 mg by mouth daily.    . vitamin B-12 (CYANOCOBALAMIN) 100 MCG tablet Take 100 mcg by mouth daily.    . Multiple Vitamins-Minerals (MULTIVITAMIN WITH MINERALS) tablet Take 1 tablet by mouth daily. (Patient not taking: Reported on 12/25/2019)    . omeprazole (PRILOSEC) 40 MG capsule Take 1 capsule (40 mg total) by mouth daily. (Patient not taking: Reported on 01/13/2020) 90 capsule 1   No current facility-administered medications for this visit.   Facility-Administered Medications Ordered in Other Visits  Medication Dose Route Frequency Provider Last Rate Last Admin  . fluorouracil (ADRUCIL) 4,200 mg in sodium chloride 0.9 % 66 mL chemo infusion  2,400 mg/m2 (Treatment Plan Recorded) Intravenous 1 day or 1 dose Charlaine Dalton R, MD   4,200 mg at 01/13/20 1247      .  PHYSICAL EXAMINATION: ECOG PERFORMANCE STATUS: 0 - Asymptomatic  Vitals:   01/13/20 0825  BP: 120/81  Pulse: 69  Resp: 16  Temp: (!) 96.9 F (36.1 C)  SpO2: 100%   Filed Weights   01/13/20 0825  Weight: 143 lb (64.9 kg)    Physical Exam HENT:     Head: Normocephalic and atraumatic.     Mouth/Throat:     Pharynx: No oropharyngeal exudate.  Eyes:     Pupils: Pupils are equal, round, and reactive to light.  Cardiovascular:     Rate and Rhythm: Normal rate and regular rhythm.  Pulmonary:     Effort: No respiratory distress.     Breath sounds: No wheezing.  Abdominal:     General: Bowel sounds are normal. There is no distension.     Palpations: Abdomen is soft. There is no mass.     Tenderness: There is no abdominal tenderness. There is no guarding or rebound.     Comments: Abdominal incision well-healed.  Musculoskeletal:        General: No tenderness. Normal range of motion.     Cervical back: Normal range of motion and  neck supple.  Skin:    General: Skin is warm.  Neurological:     Mental Status: He is alert and oriented to person, place, and time.  Psychiatric:  Mood and Affect: Affect normal.     LABORATORY DATA:  I have reviewed the data as listed Lab Results  Component Value Date   WBC 7.9 01/13/2020   HGB 12.1 (L) 01/13/2020   HCT 35.3 (L) 01/13/2020   MCV 80.2 01/13/2020   PLT 150 01/13/2020   Recent Labs    12/14/19 0814 12/29/19 0808 01/13/20 0801  NA 135 134* 135  K 4.3 3.8 4.1  CL 97* 96* 98  CO2 '29 28 28  ' GLUCOSE 164* 227* 202*  BUN '23 15 16  ' CREATININE 1.07 0.91 0.92  CALCIUM 8.6* 9.2 8.7*  GFRNONAA >60 >60 >60  GFRAA >60 >60 >60  PROT 6.7 7.2 7.0  ALBUMIN 3.5 3.7 3.7  AST '18 26 18  ' ALT '20 24 16  ' ALKPHOS 67 72 73  BILITOT 0.5 0.4 0.5    RADIOGRAPHIC STUDIES: I have personally reviewed the radiological images as listed and agreed with the findings in the report. MR Lumbar Spine W Wo Contrast  Result Date: 01/13/2020 CLINICAL DATA:  Lower back pain radiating into both legs EXAM: MRI LUMBAR SPINE WITHOUT AND WITH CONTRAST TECHNIQUE: Multiplanar and multiecho pulse sequences of the lumbar spine were obtained without and with intravenous contrast. CONTRAST:  73m GADAVIST GADOBUTROL 1 MMOL/ML IV SOLN COMPARISON:  None. FINDINGS: Segmentation: There are 5 non-rib bearing lumbar type vertebral bodies with the last intervertebral disc space labeled as L5-S1. Alignment:  There is a minimal anterolisthesis of L4 on L5. Vertebrae: The vertebral body heights are well maintained. No fracture, marrow edema,or pathologic marrow infiltration. Endplate reactive changes are seen at L5-S1 with anterior osteophyte formation. Conus medullaris and cauda equina: Conus extends to the L1 level. Conus and cauda equina appear normal. Paraspinal and other soft tissues: The paraspinal soft tissues and visualized retroperitoneal structures are unremarkable. The sacroiliac joints are intact. Disc  levels: T12-L1:  No significant canal or neural foraminal narrowing. L1-L2:   No significant canal or neural foraminal narrowing. L2-L3:   No significant canal or neural foraminal narrowing. L3-L4: There is a broad-based disc bulge with ligamentum flavum hypertrophy and facet arthrosis which causes mild bilateral neural foraminal narrowing. L4-L5: There is a broad-based disc bulge with disc uncovering facet arthrosis and ligamentum flavum hypertrophy which causes moderate to severe bilateral neural foraminal narrowing and effacement of the thecal sac which measures 4 mm in AP diameter. L5-S1: There is a large anterior disc osteophyte complex present. There is a broad-based disc bulge with facet arthrosis and ligamentum flavum hypertrophy which causes severe bilateral neural foraminal narrowing and mild central canal stenosis. IMPRESSION: 1. 2. Minimal grade 1 anterolisthesis of L4 on L5. 3. 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 4. Also at L5-S1 there is severe bilateral neural foraminal narrowing and mild central canal stenosis. Electronically Signed   By: BPrudencio PairM.D.   On: 01/13/2020 03:55    ASSESSMENT & PLAN:   [place] colon ([place] #Right colon cancer stage IV; adenocarcinoma [s/p liver biopsy]-Currentlly on FOLFOX + avastin.  Foundation 1- WILD RAS mutations.  # proceed with 5FU with Avastin; 80% oxaliplatin [see below-severe fatigue] Labs today reviewed;  acceptable for treatment today.   #Extreme fatigue-secondary to oxaliplatin/poorly controlled blood sugars- STABLE; will restart at 80% oxaliplatin today.  # Anemia- secondary- sec to chemo-hemoglobin on 11-12-stable  # Worsening back pain- no radiation;MRI lumbar spine- July 2021-NEG for mets; severe arthritic changes; spinal  canal stenosis. Declines ortho evaluation.   # Poorly controlled diabetes blood sugars-better  controlled as per patients.; Stable- BG this AM 202.   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  S/p concurrent radiation x4 cycles.  CT scan August 20, 2019- stable.  # HTN- 140s/ 80-on Avastin.  Stable.  # COPD- STABLE>   # DISPOSITION: # FOLFOX + avastin;  d-3- pump de-access # follow up in 2 weeks-MD; labs-cbc/cmp/cea; UA; FOLFOX chemo+ Avastin; D-3 pump de-access- Dr.B   All questions were answered. The patient knows to call the clinic with any problems, questions or concerns.    Cammie Sickle, MD 01/13/2020 7:37 PM

## 2020-01-14 ENCOUNTER — Ambulatory Visit: Payer: BC Managed Care – PPO

## 2020-01-14 LAB — CEA: CEA: 4.7 ng/mL (ref 0.0–4.7)

## 2020-01-15 ENCOUNTER — Inpatient Hospital Stay: Payer: BC Managed Care – PPO

## 2020-01-15 ENCOUNTER — Other Ambulatory Visit: Payer: Self-pay

## 2020-01-15 DIAGNOSIS — C182 Malignant neoplasm of ascending colon: Secondary | ICD-10-CM

## 2020-01-15 DIAGNOSIS — Z5111 Encounter for antineoplastic chemotherapy: Secondary | ICD-10-CM | POA: Diagnosis not present

## 2020-01-15 DIAGNOSIS — Z7189 Other specified counseling: Secondary | ICD-10-CM

## 2020-01-15 MED ORDER — HEPARIN SOD (PORK) LOCK FLUSH 100 UNIT/ML IV SOLN
INTRAVENOUS | Status: AC
Start: 2020-01-15 — End: ?
  Filled 2020-01-15: qty 5

## 2020-01-15 MED ORDER — HEPARIN SOD (PORK) LOCK FLUSH 100 UNIT/ML IV SOLN
500.0000 [IU] | Freq: Once | INTRAVENOUS | Status: AC | PRN
  Administered 2020-01-15: 500 [IU]
  Filled 2020-01-15: qty 5

## 2020-01-15 MED ORDER — SODIUM CHLORIDE 0.9% FLUSH
10.0000 mL | INTRAVENOUS | Status: DC | PRN
  Administered 2020-01-15: 10 mL
  Filled 2020-01-15: qty 10

## 2020-01-27 ENCOUNTER — Inpatient Hospital Stay: Payer: BC Managed Care – PPO | Attending: Internal Medicine

## 2020-01-27 ENCOUNTER — Inpatient Hospital Stay: Payer: BC Managed Care – PPO | Admitting: Internal Medicine

## 2020-01-27 ENCOUNTER — Inpatient Hospital Stay: Payer: BC Managed Care – PPO

## 2020-01-27 ENCOUNTER — Other Ambulatory Visit: Payer: Self-pay

## 2020-01-27 ENCOUNTER — Encounter: Payer: Self-pay | Admitting: Internal Medicine

## 2020-01-27 VITALS — BP 148/69 | HR 76 | Temp 96.7°F | Resp 16 | Ht 68.0 in | Wt 137.8 lb

## 2020-01-27 DIAGNOSIS — F329 Major depressive disorder, single episode, unspecified: Secondary | ICD-10-CM | POA: Diagnosis not present

## 2020-01-27 DIAGNOSIS — M4800 Spinal stenosis, site unspecified: Secondary | ICD-10-CM | POA: Insufficient documentation

## 2020-01-27 DIAGNOSIS — C180 Malignant neoplasm of cecum: Secondary | ICD-10-CM | POA: Insufficient documentation

## 2020-01-27 DIAGNOSIS — Z5111 Encounter for antineoplastic chemotherapy: Secondary | ICD-10-CM | POA: Insufficient documentation

## 2020-01-27 DIAGNOSIS — C182 Malignant neoplasm of ascending colon: Secondary | ICD-10-CM

## 2020-01-27 DIAGNOSIS — E119 Type 2 diabetes mellitus without complications: Secondary | ICD-10-CM | POA: Diagnosis not present

## 2020-01-27 DIAGNOSIS — I10 Essential (primary) hypertension: Secondary | ICD-10-CM | POA: Insufficient documentation

## 2020-01-27 DIAGNOSIS — Z79899 Other long term (current) drug therapy: Secondary | ICD-10-CM | POA: Diagnosis not present

## 2020-01-27 DIAGNOSIS — C801 Malignant (primary) neoplasm, unspecified: Secondary | ICD-10-CM | POA: Diagnosis present

## 2020-01-27 DIAGNOSIS — Z794 Long term (current) use of insulin: Secondary | ICD-10-CM | POA: Insufficient documentation

## 2020-01-27 DIAGNOSIS — F17210 Nicotine dependence, cigarettes, uncomplicated: Secondary | ICD-10-CM | POA: Diagnosis not present

## 2020-01-27 DIAGNOSIS — Z7951 Long term (current) use of inhaled steroids: Secondary | ICD-10-CM | POA: Diagnosis not present

## 2020-01-27 DIAGNOSIS — C787 Secondary malignant neoplasm of liver and intrahepatic bile duct: Secondary | ICD-10-CM | POA: Insufficient documentation

## 2020-01-27 DIAGNOSIS — J449 Chronic obstructive pulmonary disease, unspecified: Secondary | ICD-10-CM | POA: Insufficient documentation

## 2020-01-27 DIAGNOSIS — C771 Secondary and unspecified malignant neoplasm of intrathoracic lymph nodes: Secondary | ICD-10-CM | POA: Insufficient documentation

## 2020-01-27 DIAGNOSIS — Z7189 Other specified counseling: Secondary | ICD-10-CM

## 2020-01-27 LAB — COMPREHENSIVE METABOLIC PANEL
ALT: 18 U/L (ref 0–44)
AST: 21 U/L (ref 15–41)
Albumin: 4.1 g/dL (ref 3.5–5.0)
Alkaline Phosphatase: 68 U/L (ref 38–126)
Anion gap: 11 (ref 5–15)
BUN: 24 mg/dL — ABNORMAL HIGH (ref 8–23)
CO2: 25 mmol/L (ref 22–32)
Calcium: 9.2 mg/dL (ref 8.9–10.3)
Chloride: 98 mmol/L (ref 98–111)
Creatinine, Ser: 0.86 mg/dL (ref 0.61–1.24)
GFR calc Af Amer: >60 mL/min (ref >60)
GFR calc non Af Amer: >60 mL/min (ref >60)
Glucose, Bld: 254 mg/dL — ABNORMAL HIGH (ref 70–99)
Potassium: 4 mmol/L (ref 3.5–5.1)
Sodium: 134 mmol/L — ABNORMAL LOW (ref 135–145)
Total Bilirubin: 0.8 mg/dL (ref 0.3–1.2)
Total Protein: 7.2 g/dL (ref 6.5–8.1)

## 2020-01-27 LAB — URINALYSIS, COMPLETE (UACMP) WITH MICROSCOPIC
Bacteria, UA: NONE SEEN
Bilirubin Urine: NEGATIVE
Glucose, UA: >=500 mg/dL — AB
Hgb urine dipstick: NEGATIVE
Ketones, ur: NEGATIVE mg/dL
Nitrite: NEGATIVE
Protein, ur: NEGATIVE mg/dL
Specific Gravity, Urine: 1.016 (ref 1.005–1.030)
pH: 6 (ref 5.0–8.0)

## 2020-01-27 LAB — CBC WITH DIFFERENTIAL/PLATELET
Abs Immature Granulocytes: 0.02 10*3/uL (ref 0.00–0.07)
Basophils Absolute: 0 10*3/uL (ref 0.0–0.1)
Basophils Relative: 1 %
Eosinophils Absolute: 0.4 10*3/uL (ref 0.0–0.5)
Eosinophils Relative: 6 %
HCT: 37.8 % — ABNORMAL LOW (ref 39.0–52.0)
Hemoglobin: 12.7 g/dL — ABNORMAL LOW (ref 13.0–17.0)
Immature Granulocytes: 0 %
Lymphocytes Relative: 24 %
Lymphs Abs: 1.5 10*3/uL (ref 0.7–4.0)
MCH: 27.4 pg (ref 26.0–34.0)
MCHC: 33.6 g/dL (ref 30.0–36.0)
MCV: 81.5 fL (ref 80.0–100.0)
Monocytes Absolute: 0.6 10*3/uL (ref 0.1–1.0)
Monocytes Relative: 9 %
Neutro Abs: 3.8 10*3/uL (ref 1.7–7.7)
Neutrophils Relative %: 60 %
Platelets: 166 10*3/uL (ref 150–400)
RBC: 4.64 MIL/uL (ref 4.22–5.81)
RDW: 18.6 % — ABNORMAL HIGH (ref 11.5–15.5)
WBC: 6.4 10*3/uL (ref 4.0–10.5)
nRBC: 0 % (ref 0.0–0.2)

## 2020-01-27 MED ORDER — SODIUM CHLORIDE 0.9 % IV SOLN: 4.0000 mg | Freq: Once | INTRAVENOUS | Status: DC

## 2020-01-27 MED ORDER — SODIUM CHLORIDE 0.9% FLUSH
10.0000 mL | Freq: Once | INTRAVENOUS | Status: AC
  Administered 2020-01-27: 10 mL
  Filled 2020-01-27: qty 10

## 2020-01-27 MED ORDER — SODIUM CHLORIDE 0.9 % IV SOLN
2400.0000 mg/m2 | INTRAVENOUS | Status: DC
  Administered 2020-01-27: 4200 mg via INTRAVENOUS
  Filled 2020-01-27: qty 84

## 2020-01-27 MED ORDER — DEXAMETHASONE SODIUM PHOSPHATE 10 MG/ML IJ SOLN
4.0000 mg | Freq: Once | INTRAMUSCULAR | Status: AC
  Administered 2020-01-27: 4 mg via INTRAVENOUS
  Filled 2020-01-27: qty 1

## 2020-01-27 MED ORDER — LEUCOVORIN CALCIUM INJECTION 350 MG
400.0000 mg/m2 | Freq: Once | INTRAVENOUS | Status: AC
  Administered 2020-01-27: 700 mg via INTRAVENOUS
  Filled 2020-01-27: qty 25

## 2020-01-27 MED ORDER — SODIUM CHLORIDE 0.9 % IV SOLN
Freq: Once | INTRAVENOUS | Status: AC
  Filled 2020-01-27: qty 250

## 2020-01-27 MED ORDER — SODIUM CHLORIDE 0.9 % IV SOLN
5.0000 mg/kg | Freq: Once | INTRAVENOUS | Status: AC
  Administered 2020-01-27: 300 mg via INTRAVENOUS
  Filled 2020-01-27: qty 12

## 2020-01-27 MED ORDER — OXALIPLATIN CHEMO INJECTION 100 MG/20ML
68.0000 mg/m2 | Freq: Once | INTRAVENOUS | Status: AC
  Administered 2020-01-27: 120 mg via INTRAVENOUS
  Filled 2020-01-27: qty 20

## 2020-01-27 MED ORDER — PALONOSETRON HCL INJECTION 0.25 MG/5ML
0.2500 mg | Freq: Once | INTRAVENOUS | Status: AC
  Administered 2020-01-27: 0.25 mg via INTRAVENOUS
  Filled 2020-01-27: qty 5

## 2020-01-27 MED ORDER — DEXTROSE 5 % IV SOLN
Freq: Once | INTRAVENOUS | Status: AC
  Filled 2020-01-27: qty 250

## 2020-01-27 NOTE — Assessment & Plan Note (Addendum)
#  Right colon cancer stage IV; adenocarcinoma [s/p liver biopsy]-Currentlly on FOLFOX + avastin.  Foundation 1- WILD RAS mutations. STABLE.   # proceed with 5FU with Avastin; 80% oxaliplatin [see below-severe fatigue] Labs today reviewed;  acceptable for treatment today. Will order imaging at next visit.   #Extreme fatigue-secondary to oxaliplatin/poorly controlled blood sugars- STABLE; continue 80% oxaliplatin today.  # Anemia- secondary- sec to chemo-hemoglobin on 11-12-stable  # Back pain- MRI lumbar spine- July 2021-NEG for mets; severe arthritic changes; spinal canal stenosis.on hydrocodone prn [twice a week prn]  # Poorly controlled diabetes blood sugars-better controlled as per patients.; Stable- BG this AM 228.  Recommend close monitoring.  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  S/p concurrent radiation x4 cycles.  CT scan August 20, 2019- STABLE.   # HTN- 140s/ 80-on Avastin. STABLE.   # COPD- STABLE.   # DISPOSITION: # today FOLFOX + avastin;  d-3- pump de-access # follow up in 2 weeks-MD; labs-cbc/cmp/cea; UA; FOLFOX chemo+ Avastin; D-3 pump de-access- Dr.B

## 2020-01-27 NOTE — Progress Notes (Signed)
[place] NOTE  Patient Care Team: Laneta Simmers, NP as PCP - General (Nurse Practitioner) Telford Nab, RN as Registered Nurse Clent Jacks, RN as Oncology Nurse Navigator  CHIEF COMPLAINTS/PURPOSE OF CONSULTATION: Lung cancer/colon cancer   Oncology History Overview Note  # July 2020- SMALL CELL CA METASTATIC TO MEDIASTINAL LN [open Bx; Dr.Oaks]; TxN2M0; July 2nd 2020-PET- 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  MRI brain negative  # aug 17th 2020-carbo etoposide-RT [? 8/19]; #4 cycle carbo-Etop [finished dec 30th,2020]  #August 2020 iron deficient anemia-question etiology; IV Feraheme [no colonoscopy]  # OCT 2020- colo/ Cecal adeno ca; MRI liver- 1 cm enhancing lesion "metastasis" [Dr.Anna/Dr.Cannon]-difficulty biopsy.  November 02-2019 right hemicolectomy- STAGE III [pT3pN1 (2/13LN)]- HOLD adjuvant therapy. MMR- INTACT/LOW  #May 2021-liver biopsy-possible adenocarcinoma; colorectal origin.  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)   02/09/2019 - 07/12/2019 Chemotherapy   The patient had palonosetron (ALOXI) injection 0.25 mg, 0.25 mg, Intravenous,  Once, 4 of 4 cycles Administration: 0.25 mg (02/09/2019), 0.25 mg (03/03/2019), 0.25 mg (06/02/2019), 0.25 mg (06/22/2019) CARBOplatin (PARAPLATIN) 410 mg in sodium chloride 0.9 % 250 mL  chemo infusion, 410 mg (100 % of original dose 414 mg), Intravenous,  Once, 4 of 4 cycles Dose modification:   (original dose 414 mg, Cycle 1) Administration: 410 mg (02/09/2019), 410 mg (03/03/2019), 410 mg (06/02/2019), 410 mg (06/22/2019) etoposide (VEPESID) 180 mg in sodium chloride 0.9 % 500 mL chemo infusion, 100 mg/m2 = 180 mg, Intravenous,  Once, 4 of 4 cycles Administration: 180 mg (02/09/2019), 180 mg (02/10/2019), 180 mg (02/11/2019), 180 mg (03/03/2019), 180 mg (03/04/2019), 180 mg (03/05/2019), 180 mg (06/02/2019), 180 mg (06/03/2019), 180 mg (06/04/2019), 180 mg (06/22/2019), 180 mg (06/23/2019), 180 mg (06/24/2019) fosaprepitant (EMEND) 150 mg, dexamethasone (DECADRON) 6 mg in sodium chloride 0.9 % 145 mL IVPB, , Intravenous,  Once, 2 of 2 cycles Administration:  (06/02/2019),  (06/22/2019)  for chemotherapy treatment.    Cancer of ascending colon ([place])  05/04/2019 Initial Diagnosis   Cancer of ascending colon ([place])   11/30/2019 -  Chemotherapy   The patient had dexamethasone (DECADRON) 4 MG tablet, 8 mg, Oral, Daily, 1 of 1 cycle, Start date: --, End date: -- palonosetron (ALOXI) injection 0.25 mg, 0.25 mg, Intravenous,  Once, 3 of 6 cycles Administration: 0.25 mg (11/30/2019), 0.25 mg (01/13/2020), 0.25 mg (12/14/2019) leucovorin 700 mg in dextrose 5 % 250 mL infusion, 400 mg/m2 = 700 mg, Intravenous,  Once, 4 of 7 cycles Administration: 700 mg (11/30/2019), 700 mg (12/29/2019), 700 mg (01/13/2020), 700 mg (12/14/2019) oxaliplatin (ELOXATIN) 150 mg in dextrose 5 % 500 mL chemo infusion, 85 mg/m2 = 150 mg, Intravenous,  Once, 3 of 6 cycles Dose modification: 68 mg/m2 (original dose 85 mg/m2, Cycle 4, Reason: Provider Judgment) Administration: 120 mg (01/13/2020), 150 mg (12/14/2019) fluorouracil (ADRUCIL) 4,200 mg in sodium chloride 0.9 % 66 mL chemo infusion, 2,400 mg/m2 = 4,200 mg,  Intravenous, 1 Day/Dose, 4 of 7 cycles Administration: 4,200 mg (11/30/2019), 4,200 mg (12/29/2019), 4,200 mg (01/13/2020), 4,200  mg (12/14/2019) bevacizumab-bvzr (ZIRABEV) 300 mg in sodium chloride 0.9 % 100 mL chemo infusion, 5 mg/kg = 300 mg, Intravenous,  Once, 4 of 7 cycles Administration: 300 mg (12/29/2019), 300 mg (01/13/2020), 300 mg (12/14/2019)  for chemotherapy treatment.     HISTORY OF PRESENTING ILLNESS:   Kevin Mckay 69 y.o.  male synchronous primaries-synchronous primaries limited stage small cell lung cancer s/p chemoradiation; and stage IV colon cancer-with metastasis to liver currently on FOLFOX plus Avastin is here for follow-up.  Patient states his back pain is improved.  He placed taking hydrocodone as needed twice a week.  Continues to have mild to moderate fatigue.  No nausea no vomiting.  Appetite is fair.  Denies any headaches.  Chronic shortness of breath chronic mild leg swelling.  Review of Systems  Constitutional: Positive for malaise/fatigue and weight loss. Negative for chills, diaphoresis and fever.  HENT: Negative for nosebleeds and sore throat.   Eyes: Negative for double vision.  Respiratory: Positive for shortness of breath. Negative for hemoptysis and wheezing.   Cardiovascular: Positive for leg swelling. Negative for chest pain, palpitations and orthopnea.  Gastrointestinal: Negative for abdominal pain, blood in stool, constipation, diarrhea, heartburn, melena, nausea and vomiting.  Genitourinary: Negative for dysuria, frequency and urgency.  Musculoskeletal: Positive for back pain, joint pain and myalgias.  Skin: Negative.  Negative for itching and rash.  Neurological: Positive for tingling. Negative for dizziness, focal weakness and weakness.  Endo/Heme/Allergies: Does not bruise/bleed easily.  Psychiatric/Behavioral: Negative for depression. The patient is not nervous/anxious and does not have insomnia.      MEDICAL HISTORY:  Past Medical History:  Diagnosis Date  . Asthma   . Cancer ([place])    Metastatic small cell lung cancer  . Cerebral aneurysm   . Chronic painful  diabetic neuropathy ([place])   . Depression   . Diabetes mellitus without complication ([place])   . Essential hypertension   . History of kidney stones   . Occasional tremors   . Tobacco use     SURGICAL HISTORY: Past Surgical History:  Procedure Laterality Date  . ABDOMINAL SURGERY     age 80. trauma surgery due to forklift injury- liver and spleen  . COLONOSCOPY WITH PROPOFOL N/A 03/27/2019   Procedure: COLONOSCOPY WITH PROPOFOL;  Surgeon: Jonathon Bellows, MD;  Location: [facility] ENDOSCOPY;  Service: Gastroenterology;  Laterality: N/A;  . COLOSTOMY REVISION Right 05/04/2019   Procedure: COLON RESECTION RIGHT-right hemicolectomy-open;  Surgeon: Fredirick Maudlin, MD;  Location: [facility] ORS;  Service: General;  Laterality: Right;  . ESOPHAGOGASTRODUODENOSCOPY (EGD) WITH PROPOFOL N/A 03/27/2019   Procedure: ESOPHAGOGASTRODUODENOSCOPY (EGD) WITH PROPOFOL;  Surgeon: Jonathon Bellows, MD;  Location: [facility] ENDOSCOPY;  Service: Gastroenterology;  Laterality: N/A;  . IR GENERIC HISTORICAL  05/31/2016   IR ANGIO VERTEBRAL SEL VERTEBRAL BILAT MOD SED 05/31/2016 Consuella Lose, MD [facility]-INTERV RAD  . IR GENERIC HISTORICAL  05/31/2016   IR ANGIO INTRA EXTRACRAN SEL INTERNAL CAROTID BILAT MOD SED 05/31/2016 Consuella Lose, MD [facility]-INTERV RAD  . PORTACATH PLACEMENT Right 02/04/2019   Procedure: INSERTION PORT-A-CATH;  Surgeon: [name], MD;  Location: [facility] ORS;  Service: General;  Laterality: Right;  . THORACOTOMY Left 01/22/2019   Procedure: PRE OP BRONCH LEFT ANTERIOR THORACOTOMY WITH BIOPSY OF MEDIASTINAL MASS;  Surgeon: [name], MD;  Location: [facility] ORS;  Service: General;  Laterality: Left;    SOCIAL HISTORY: Social History   Socioeconomic  History  . Marital status: Married    Spouse name: Not on file  . Number of children: Not on file  . Years of education: Not on file  . Highest education level: Not on file  Occupational History  . Not on file  Tobacco Use  . Smoking status: Current Every Day Smoker     Packs/day: 0.50    Years: 50.00    Pack years: 25.00    Types: Cigarettes  . Smokeless tobacco: Never Used  Vaping Use  . Vaping Use: Every day  . Substances: Nicotine  Substance and Sexual Activity  . Alcohol use: No  . Drug use: Yes    Types: Barbituates, Marijuana  . Sexual activity: Not on file  Other Topics Concern  . Not on file  Social History Narrative   Lives in [place]; wife/ son/grandson [custody]; smoker; vending business; hx of alcoholism- quit at 19.    Social Determinants of Health   Financial Resource Strain: Low Risk   . Difficulty of Paying Living Expenses: Not hard at all  Food Insecurity: No Food Insecurity  . Worried About Charity fundraiser in the Last Year: Never true  . Ran Out of Food in the Last Year: Never true  Transportation Needs: No Transportation Needs  . Lack of Transportation (Medical): No  . Lack of Transportation (Non-Medical): No  Physical Activity: Inactive  . Days of Exercise per Week: 0 days  . Minutes of Exercise per Session: 0 min  Stress: No Stress Concern Present  . Feeling of Stress : Not at all  Social Connections: Unknown  . Frequency of Communication with Friends and Family: Patient refused  . Frequency of Social Gatherings with Friends and Family: Patient refused  . Attends Religious Services: Patient refused  . Active Member of Clubs or Organizations: Patient refused  . Attends Archivist Meetings: Patient refused  . Marital Status: Patient refused  Intimate Partner Violence: Not At Risk  . Fear of Current or Ex-Partner: No  . Emotionally Abused: No  . Physically Abused: No  . Sexually Abused: No    FAMILY HISTORY: Family History  Problem Relation Age of Onset  . Lymphoma Father   . Liver cancer Paternal Uncle   . Lung cancer Paternal Uncle   . Lung cancer Maternal Uncle     ALLERGIES:  is allergic to ferumoxytol, amoxicillin, and codeine.  MEDICATIONS:  Current Outpatient Medications   Medication Sig Dispense Refill  . amLODipine (NORVASC) 10 MG tablet Take 10 mg by mouth every morning.     . budesonide-formoterol (SYMBICORT) 160-4.5 MCG/ACT inhaler Inhale 2 puffs into the lungs 2 (two) times daily.    . DULoxetine (CYMBALTA) 30 MG capsule Take 30 mg by mouth every morning.     . ferrous sulfate 324 (65 Fe) MG TBEC Take 324 mg by mouth daily.     Marland Kitchen gabapentin (NEURONTIN) 300 MG capsule Take 600 mg by mouth 3 (three) times daily.     . hydrochlorothiazide (HYDRODIURIL) 25 MG tablet Take 25 mg by mouth daily.     Marland Kitchen HYDROcodone-acetaminophen (NORCO/VICODIN) 5-325 MG tablet Take 1 tablet by mouth every 8 (eight) hours as needed for moderate pain. 45 tablet 0  . insulin glargine (LANTUS) 100 UNIT/ML injection Inject into the skin daily. Units based By sliding scale    . Ipratropium-Albuterol (COMBIVENT) 20-100 MCG/ACT AERS respimat Inhale into the lungs.    . lidocaine-prilocaine (EMLA) cream Apply 1 application topically as needed. 30-45 mins  prior to port access. 30 g 0  . linaclotide (LINZESS) 145 MCG CAPS capsule Take 145 mcg by mouth daily before breakfast.    . lovastatin (MEVACOR) 20 MG tablet Take 20 mg by mouth every evening.    . metFORMIN (GLUCOPHAGE) 1000 MG tablet Take 1,000 mg by mouth 2 (two) times daily with a meal.     . metFORMIN (GLUCOPHAGE-XR) 500 MG 24 hr tablet Take 1,000 mg by mouth 2 (two) times daily.    . ondansetron (ZOFRAN) 8 MG tablet Take 1 tablet (8 mg total) by mouth every 8 (eight) hours as needed for nausea or vomiting (start 3 days; after chemo). 40 tablet 1  . polyethylene glycol powder (MIRALAX) 17 GM/SCOOP powder Mix full container in 64 ounces of Gatorade or other clear liquid. NO RED Liquids 238 g 0  . prazosin (MINIPRESS) 1 MG capsule Take 1 mg by mouth at bedtime.    . psyllium (METAMUCIL) 58.6 % powder Take 1 packet by mouth daily.    Marland Kitchen pyridOXINE (VITAMIN B-6) 100 MG tablet Take 100 mg by mouth daily.    . sitaGLIPtin (JANUVIA) 25 MG  tablet Take 25 mg by mouth daily.    . vitamin B-12 (CYANOCOBALAMIN) 100 MCG tablet Take 100 mcg by mouth daily.    . Multiple Vitamins-Minerals (MULTIVITAMIN WITH MINERALS) tablet Take 1 tablet by mouth daily. (Patient not taking: Reported on 12/25/2019)    . omeprazole (PRILOSEC) 40 MG capsule Take 1 capsule (40 mg total) by mouth daily. (Patient not taking: Reported on 01/13/2020) 90 capsule 1   No current facility-administered medications for this visit.   Facility-Administered Medications Ordered in Other Visits  Medication Dose Route Frequency Provider Last Rate Last Admin  . sodium chloride flush (NS) 0.9 % injection 10 mL  10 mL Intracatheter Once Cammie Sickle, MD          .  PHYSICAL EXAMINATION: ECOG PERFORMANCE STATUS: 0 - Asymptomatic  Vitals:   01/27/20 0832  BP: (!) 148/69  Pulse: 76  Resp: 16  Temp: (!) 96.7 F (35.9 C)  SpO2: 100%   Filed Weights   01/27/20 0832  Weight: 137 lb 12.8 oz (62.5 kg)    Physical Exam HENT:     Head: Normocephalic and atraumatic.     Mouth/Throat:     Pharynx: No oropharyngeal exudate.  Eyes:     Pupils: Pupils are equal, round, and reactive to light.  Cardiovascular:     Rate and Rhythm: Normal rate and regular rhythm.  Pulmonary:     Effort: No respiratory distress.     Breath sounds: No wheezing.  Abdominal:     General: Bowel sounds are normal. There is no distension.     Palpations: Abdomen is soft. There is no mass.     Tenderness: There is no abdominal tenderness. There is no guarding or rebound.     Comments: Abdominal incision well-healed.  Musculoskeletal:        General: No tenderness. Normal range of motion.     Cervical back: Normal range of motion and neck supple.  Skin:    General: Skin is warm.  Neurological:     Mental Status: He is alert and oriented to person, place, and time.  Psychiatric:        Mood and Affect: Affect normal.     LABORATORY DATA:  I have reviewed the data as  listed Lab Results  Component Value Date   WBC 6.4 01/27/2020  HGB 12.7 (L) 01/27/2020   HCT 37.8 (L) 01/27/2020   MCV 81.5 01/27/2020   PLT 166 01/27/2020   Recent Labs    12/29/19 0808 01/13/20 0801 01/27/20 0821  NA 134* 135 134*  K 3.8 4.1 4.0  CL 96* 98 98  CO2 '28 28 25  ' GLUCOSE 227* 202* 254*  BUN 15 16 24*  CREATININE 0.91 0.92 0.86  CALCIUM 9.2 8.7* 9.2  GFRNONAA >60 >60 >60  GFRAA >60 >60 >60  PROT 7.2 7.0 7.2  ALBUMIN 3.7 3.7 4.1  AST '26 18 21  ' ALT '24 16 18  ' ALKPHOS 72 73 68  BILITOT 0.4 0.5 0.8    RADIOGRAPHIC STUDIES: I have personally reviewed the radiological images as listed and agreed with the findings in the report. MR Lumbar Spine W Wo Contrast  Result Date: 01/13/2020 CLINICAL DATA:  Lower back pain radiating into both legs EXAM: MRI LUMBAR SPINE WITHOUT AND WITH CONTRAST TECHNIQUE: Multiplanar and multiecho pulse sequences of the lumbar spine were obtained without and with intravenous contrast. CONTRAST:  61m GADAVIST GADOBUTROL 1 MMOL/ML IV SOLN COMPARISON:  None. FINDINGS: Segmentation: There are 5 non-rib bearing lumbar type vertebral bodies with the last intervertebral disc space labeled as L5-S1. Alignment:  There is a minimal anterolisthesis of L4 on L5. Vertebrae: The vertebral body heights are well maintained. No fracture, marrow edema,or pathologic marrow infiltration. Endplate reactive changes are seen at L5-S1 with anterior osteophyte formation. Conus medullaris and cauda equina: Conus extends to the L1 level. Conus and cauda equina appear normal. Paraspinal and other soft tissues: The paraspinal soft tissues and visualized retroperitoneal structures are unremarkable. The sacroiliac joints are intact. Disc levels: T12-L1:  No significant canal or neural foraminal narrowing. L1-L2:   No significant canal or neural foraminal narrowing. L2-L3:   No significant canal or neural foraminal narrowing. L3-L4: There is a broad-based disc bulge with ligamentum  flavum hypertrophy and facet arthrosis which causes mild bilateral neural foraminal narrowing. L4-L5: There is a broad-based disc bulge with disc uncovering facet arthrosis and ligamentum flavum hypertrophy which causes moderate to severe bilateral neural foraminal narrowing and effacement of the thecal sac which measures 4 mm in AP diameter. L5-S1: There is a large anterior disc osteophyte complex present. There is a broad-based disc bulge with facet arthrosis and ligamentum flavum hypertrophy which causes severe bilateral neural foraminal narrowing and mild central canal stenosis. IMPRESSION: 1. 2. Minimal grade 1 anterolisthesis of L4 on L5. 3. 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 4. Also at L5-S1 there is severe bilateral neural foraminal narrowing and mild central canal stenosis. Electronically Signed   By: BPrudencio PairM.D.   On: 01/13/2020 03:55    ASSESSMENT & PLAN:   Cancer of ascending colon ([place] #Right colon cancer stage IV; adenocarcinoma [s/p liver biopsy]-Currentlly on FOLFOX + avastin.  Foundation 1- WILD RAS mutations. STABLE.   # proceed with 5FU with Avastin; 80% oxaliplatin [see below-severe fatigue] Labs today reviewed;  acceptable for treatment today. Will order imaging at next visit.   #Extreme fatigue-secondary to oxaliplatin/poorly controlled blood sugars- STABLE; continue 80% oxaliplatin today.  # Anemia- secondary- sec to chemo-hemoglobin on 11-12-stable  # Back pain- MRI lumbar spine- July 2021-NEG for mets; severe arthritic changes; spinal canal stenosis.on hydrocodone prn [twice a week prn]  # Poorly controlled diabetes blood sugars-better controlled as per patients.; Stable- BG this AM 228.  Recommend close monitoring.  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  S/p concurrent radiation x4 cycles.  CT scan August 20, 2019- STABLE.   # HTN- 140s/  80-on Avastin. STABLE.   # COPD- STABLE.   # DISPOSITION: # today FOLFOX + avastin;  d-3- pump de-access # follow up in 2 weeks-MD; labs-cbc/cmp/cea; UA; FOLFOX chemo+ Avastin; D-3 pump de-access- Dr.B   All questions were answered. The patient knows to call the clinic with any problems, questions or concerns.    Cammie Sickle, MD 01/27/2020 9:10 AM

## 2020-01-28 LAB — CEA: CEA: 5.2 ng/mL — ABNORMAL HIGH (ref 0.0–4.7)

## 2020-01-29 ENCOUNTER — Other Ambulatory Visit: Payer: Self-pay | Admitting: *Deleted

## 2020-01-29 ENCOUNTER — Inpatient Hospital Stay: Payer: BC Managed Care – PPO

## 2020-01-29 ENCOUNTER — Other Ambulatory Visit: Payer: Self-pay

## 2020-01-29 VITALS — BP 130/91 | HR 79 | Temp 97.5°F | Resp 18

## 2020-01-29 DIAGNOSIS — E1169 Type 2 diabetes mellitus with other specified complication: Secondary | ICD-10-CM

## 2020-01-29 DIAGNOSIS — C182 Malignant neoplasm of ascending colon: Secondary | ICD-10-CM

## 2020-01-29 DIAGNOSIS — Z5111 Encounter for antineoplastic chemotherapy: Secondary | ICD-10-CM | POA: Diagnosis not present

## 2020-01-29 DIAGNOSIS — E1110 Type 2 diabetes mellitus with ketoacidosis without coma: Secondary | ICD-10-CM

## 2020-01-29 DIAGNOSIS — Z7189 Other specified counseling: Secondary | ICD-10-CM

## 2020-01-29 MED ORDER — HEPARIN SOD (PORK) LOCK FLUSH 100 UNIT/ML IV SOLN
500.0000 [IU] | Freq: Once | INTRAVENOUS | Status: AC | PRN
  Administered 2020-01-29: 500 [IU]
  Filled 2020-01-29: qty 5

## 2020-01-29 MED ORDER — SODIUM CHLORIDE 0.9% FLUSH
10.0000 mL | INTRAVENOUS | Status: DC | PRN
  Administered 2020-01-29: 10 mL
  Filled 2020-01-29: qty 10

## 2020-01-29 MED ORDER — HEPARIN SOD (PORK) LOCK FLUSH 100 UNIT/ML IV SOLN
INTRAVENOUS | Status: AC
  Filled 2020-01-29: qty 5

## 2020-02-10 ENCOUNTER — Inpatient Hospital Stay: Payer: BC Managed Care – PPO

## 2020-02-10 ENCOUNTER — Encounter: Payer: Self-pay | Admitting: Internal Medicine

## 2020-02-10 ENCOUNTER — Inpatient Hospital Stay: Payer: BC Managed Care – PPO | Admitting: Internal Medicine

## 2020-02-10 ENCOUNTER — Other Ambulatory Visit: Payer: Self-pay

## 2020-02-10 VITALS — BP 144/70 | HR 71 | Temp 96.4°F | Resp 16 | Ht 68.0 in | Wt 138.0 lb

## 2020-02-10 DIAGNOSIS — C182 Malignant neoplasm of ascending colon: Secondary | ICD-10-CM | POA: Diagnosis not present

## 2020-02-10 DIAGNOSIS — C781 Secondary malignant neoplasm of mediastinum: Secondary | ICD-10-CM

## 2020-02-10 DIAGNOSIS — Z5111 Encounter for antineoplastic chemotherapy: Secondary | ICD-10-CM | POA: Diagnosis not present

## 2020-02-10 DIAGNOSIS — C801 Malignant (primary) neoplasm, unspecified: Secondary | ICD-10-CM

## 2020-02-10 DIAGNOSIS — Z7189 Other specified counseling: Secondary | ICD-10-CM

## 2020-02-10 DIAGNOSIS — Z95828 Presence of other vascular implants and grafts: Secondary | ICD-10-CM

## 2020-02-10 DIAGNOSIS — E1169 Type 2 diabetes mellitus with other specified complication: Secondary | ICD-10-CM

## 2020-02-10 LAB — URINALYSIS, COMPLETE (UACMP) WITH MICROSCOPIC
Bacteria, UA: NONE SEEN
Bilirubin Urine: NEGATIVE
Glucose, UA: >=500 mg/dL — AB
Hgb urine dipstick: NEGATIVE
Ketones, ur: NEGATIVE mg/dL
Leukocytes,Ua: NEGATIVE
Nitrite: NEGATIVE
Protein, ur: NEGATIVE mg/dL
Specific Gravity, Urine: 1.011 (ref 1.005–1.030)
Squamous Epithelial / HPF: NONE SEEN (ref 0–5)
pH: 6 (ref 5.0–8.0)

## 2020-02-10 LAB — COMPREHENSIVE METABOLIC PANEL
ALT: 18 U/L (ref 0–44)
AST: 19 U/L (ref 15–41)
Albumin: 4 g/dL (ref 3.5–5.0)
Alkaline Phosphatase: 66 U/L (ref 38–126)
Anion gap: 8 (ref 5–15)
BUN: 16 mg/dL (ref 8–23)
CO2: 24 mmol/L (ref 22–32)
Calcium: 8.8 mg/dL — ABNORMAL LOW (ref 8.9–10.3)
Chloride: 101 mmol/L (ref 98–111)
Creatinine, Ser: 0.96 mg/dL (ref 0.61–1.24)
GFR calc Af Amer: >60 mL/min (ref >60)
GFR calc non Af Amer: >60 mL/min (ref >60)
Glucose, Bld: 249 mg/dL — ABNORMAL HIGH (ref 70–99)
Potassium: 4.5 mmol/L (ref 3.5–5.1)
Sodium: 133 mmol/L — ABNORMAL LOW (ref 135–145)
Total Bilirubin: 0.5 mg/dL (ref 0.3–1.2)
Total Protein: 7.3 g/dL (ref 6.5–8.1)

## 2020-02-10 LAB — CBC WITH DIFFERENTIAL/PLATELET
Abs Immature Granulocytes: 0.02 10*3/uL (ref 0.00–0.07)
Basophils Absolute: 0.1 10*3/uL (ref 0.0–0.1)
Basophils Relative: 1 %
Eosinophils Absolute: 0.4 10*3/uL (ref 0.0–0.5)
Eosinophils Relative: 5 %
HCT: 35.8 % — ABNORMAL LOW (ref 39.0–52.0)
Hemoglobin: 12.2 g/dL — ABNORMAL LOW (ref 13.0–17.0)
Immature Granulocytes: 0 %
Lymphocytes Relative: 25 %
Lymphs Abs: 1.8 10*3/uL (ref 0.7–4.0)
MCH: 28.1 pg (ref 26.0–34.0)
MCHC: 34.1 g/dL (ref 30.0–36.0)
MCV: 82.5 fL (ref 80.0–100.0)
Monocytes Absolute: 0.8 10*3/uL (ref 0.1–1.0)
Monocytes Relative: 12 %
Neutro Abs: 4.1 10*3/uL (ref 1.7–7.7)
Neutrophils Relative %: 57 %
Platelets: 137 10*3/uL — ABNORMAL LOW (ref 150–400)
RBC: 4.34 MIL/uL (ref 4.22–5.81)
RDW: 19.2 % — ABNORMAL HIGH (ref 11.5–15.5)
WBC: 7.2 10*3/uL (ref 4.0–10.5)
nRBC: 0 % (ref 0.0–0.2)

## 2020-02-10 LAB — HEMOGLOBIN A1C
Hgb A1c MFr Bld: 9.5 % — ABNORMAL HIGH (ref 4.8–5.6)
Mean Plasma Glucose: 225.95 mg/dL

## 2020-02-10 MED ORDER — DEXTROSE 5 % IV SOLN
Freq: Once | INTRAVENOUS | Status: AC
  Filled 2020-02-10: qty 250

## 2020-02-10 MED ORDER — SODIUM CHLORIDE 0.9 % IV SOLN
2400.0000 mg/m2 | INTRAVENOUS | Status: DC
  Administered 2020-02-10: 4200 mg via INTRAVENOUS
  Filled 2020-02-10: qty 84

## 2020-02-10 MED ORDER — HEPARIN SOD (PORK) LOCK FLUSH 100 UNIT/ML IV SOLN
500.0000 [IU] | Freq: Once | INTRAVENOUS | Status: DC | PRN
  Filled 2020-02-10: qty 5

## 2020-02-10 MED ORDER — DEXAMETHASONE SODIUM PHOSPHATE 10 MG/ML IJ SOLN
4.0000 mg | Freq: Once | INTRAMUSCULAR | Status: AC
  Administered 2020-02-10: 4 mg via INTRAVENOUS
  Filled 2020-02-10: qty 1

## 2020-02-10 MED ORDER — PALONOSETRON HCL INJECTION 0.25 MG/5ML
0.2500 mg | Freq: Once | INTRAVENOUS | Status: AC
  Administered 2020-02-10: 0.25 mg via INTRAVENOUS
  Filled 2020-02-10: qty 5

## 2020-02-10 MED ORDER — HEPARIN SOD (PORK) LOCK FLUSH 100 UNIT/ML IV SOLN
INTRAVENOUS | Status: AC
  Filled 2020-02-10: qty 5

## 2020-02-10 MED ORDER — OXALIPLATIN CHEMO INJECTION 100 MG/20ML
68.0000 mg/m2 | Freq: Once | INTRAVENOUS | Status: AC
  Administered 2020-02-10: 120 mg via INTRAVENOUS
  Filled 2020-02-10: qty 20

## 2020-02-10 MED ORDER — HEPARIN SOD (PORK) LOCK FLUSH 100 UNIT/ML IV SOLN
500.0000 [IU] | Freq: Once | INTRAVENOUS | Status: DC
  Filled 2020-02-10: qty 5

## 2020-02-10 MED ORDER — SODIUM CHLORIDE 0.9% FLUSH
10.0000 mL | Freq: Once | INTRAVENOUS | Status: AC
  Administered 2020-02-10: 10 mL via INTRAVENOUS
  Filled 2020-02-10: qty 10

## 2020-02-10 MED ORDER — SODIUM CHLORIDE 0.9 % IV SOLN: 4.0000 mg | Freq: Once | INTRAVENOUS | Status: DC

## 2020-02-10 MED ORDER — SODIUM CHLORIDE 0.9 % IV SOLN
5.0000 mg/kg | Freq: Once | INTRAVENOUS | Status: AC
  Administered 2020-02-10: 300 mg via INTRAVENOUS
  Filled 2020-02-10: qty 12

## 2020-02-10 MED ORDER — LEUCOVORIN CALCIUM INJECTION 350 MG
400.0000 mg/m2 | Freq: Once | INTRAVENOUS | Status: AC
  Administered 2020-02-10: 700 mg via INTRAVENOUS
  Filled 2020-02-10: qty 25

## 2020-02-10 NOTE — Progress Notes (Signed)
[facility] CONSULT NOTE  Patient Care Team: Pointer, Elizabeth, NP as PCP - General (Nurse Practitioner) Rhode, Hayley, RN as Registered Nurse Stanton, Kristi D, RN as Oncology Nurse Navigator  CHIEF COMPLAINTS/PURPOSE OF CONSULTATION: Lung cancer/colon cancer   Oncology History Overview Note  # July 2020- SMALL CELL CA METASTATIC TO MEDIASTINAL LN [open Bx; Dr.Oaks]; TxN2M0; July 2nd 2020-PET- 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  MRI brain negative  # aug 17th 2020-carbo etoposide-RT [? 8/19]; #4 cycle carbo-Etop [finished dec 30th,2020]  #August 2020 iron deficient anemia-question etiology; IV Feraheme [no colonoscopy]  # OCT 2020- colo/ Cecal adeno ca; MRI liver- 1 cm enhancing lesion "metastasis" [Dr.Anna/Dr.Cannon]-difficulty biopsy.  November 02-2019 right hemicolectomy- STAGE III [pT3pN1 (2/13LN)]- HOLD adjuvant therapy. MMR- INTACT/LOW  #May 2021-liver biopsy-possible adenocarcinoma; colorectal origin.  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9 % 500 mL chemo infusion, 100 mg/m2 = 180 mg, Intravenous,  Once, 4 of 4 cycles Administration: 180 mg (02/09/2019), 180 mg (02/10/2019), 180 mg (02/11/2019), 180 mg (03/03/2019), 180 mg (03/04/2019), 180 mg (03/05/2019), 180 mg (06/02/2019), 180 mg (06/03/2019), 180 mg (06/04/2019), 180 mg (06/22/2019), 180 mg (06/23/2019), 180 mg (06/24/2019) fosaprepitant (EMEND) 150 mg, dexamethasone (DECADRON) 6 mg in sodium chloride 0.9 % 145 mL IVPB, , Intravenous,  Once, 2 of 2 cycles Administration:  (06/02/2019),  (06/22/2019)  for chemotherapy treatment.    Cancer of ascending colon (HCC)  05/04/2019 Initial Diagnosis   Cancer of ascending colon (HCC)   11/30/2019 -  Chemotherapy   The patient had dexamethasone (DECADRON) 4 MG tablet, 8 mg, Oral, Daily, 1 of 1 cycle, Start date: --, End date: -- palonosetron (ALOXI) injection 0.25 mg, 0.25 mg, Intravenous,  Once, 5 of 6 cycles Administration: 0.25 mg (11/30/2019), 0.25 mg (01/13/2020), 0.25 mg (01/27/2020), 0.25 mg (12/14/2019) leucovorin 700 mg in dextrose 5 % 250 mL infusion, 400 mg/m2 = 700 mg, Intravenous,  Once, 6 of 7 cycles Administration: 700 mg (11/30/2019), 700 mg (12/29/2019), 700 mg (01/13/2020), 700 mg (01/27/2020), 700 mg (12/14/2019) oxaliplatin (ELOXATIN) 150 mg in dextrose 5 % 500 mL chemo infusion, 85 mg/m2 = 150 mg, Intravenous,  Once, 5 of 6 cycles Dose modification: 68 mg/m2 (original dose 85 mg/m2, Cycle 4, Reason: Provider Judgment) Administration: 120 mg (01/13/2020), 120 mg (01/27/2020), 150 mg (12/14/2019) fluorouracil (ADRUCIL) 4,200 mg in sodium chloride 0.9 %   66 mL chemo infusion, 2,400 mg/m2 = 4,200 mg, Intravenous, 1 Day/Dose, 6 of 7 cycles Administration: 4,200 mg  (11/30/2019), 4,200 mg (12/29/2019), 4,200 mg (01/13/2020), 4,200 mg (01/27/2020), 4,200 mg (12/14/2019) bevacizumab-bvzr (ZIRABEV) 300 mg in sodium chloride 0.9 % 100 mL chemo infusion, 5 mg/kg = 300 mg, Intravenous,  Once, 6 of 7 cycles Administration: 300 mg (12/29/2019), 300 mg (01/13/2020), 300 mg (01/27/2020), 300 mg (12/14/2019)  for chemotherapy treatment.     HISTORY OF PRESENTING ILLNESS:   Kevin Mckay 69 y.o.  male synchronous primaries-synchronous primaries limited stage small cell lung cancer s/p chemoradiation; and stage IV colon cancer-with metastasis to liver currently on FOLFOX plus Avastin is here for follow-up.  Patient continues to have fatigue with chemotherapy. Continues to have cramps in his legs for which he is taking hydrocodone as needed.  Tingling and numbness in extremities especially with exposure to cold. No headaches. Chronic shortness of breath/chronic cough.   Review of Systems  Constitutional: Positive for malaise/fatigue and weight loss. Negative for chills, diaphoresis and fever.  HENT: Negative for nosebleeds and sore throat.   Eyes: Negative for double vision.  Respiratory: Positive for shortness of breath. Negative for hemoptysis and wheezing.   Cardiovascular: Positive for leg swelling. Negative for chest pain, palpitations and orthopnea.  Gastrointestinal: Negative for abdominal pain, blood in stool, constipation, diarrhea, heartburn, melena, nausea and vomiting.  Genitourinary: Negative for dysuria, frequency and urgency.  Musculoskeletal: Positive for back pain, joint pain and myalgias.  Skin: Negative.  Negative for itching and rash.  Neurological: Positive for tingling. Negative for dizziness, focal weakness and weakness.  Endo/Heme/Allergies: Does not bruise/bleed easily.  Psychiatric/Behavioral: Negative for depression. The patient is not nervous/anxious and does not have insomnia.      MEDICAL HISTORY:  Past Medical History:  Diagnosis Date  .  Asthma   . Cancer ([place])    Metastatic small cell lung cancer  . Cerebral aneurysm   . Chronic painful diabetic neuropathy ([place])   . Depression   . Diabetes mellitus without complication ([place])   . Essential hypertension   . History of kidney stones   . Occasional tremors   . Tobacco use     SURGICAL HISTORY: Past Surgical History:  Procedure Laterality Date  . ABDOMINAL SURGERY     age 80. trauma surgery due to forklift injury- liver and spleen  . COLONOSCOPY WITH PROPOFOL N/A 03/27/2019   Procedure: COLONOSCOPY WITH PROPOFOL;  Surgeon: Jonathon Bellows, MD;  Location: [facility] ENDOSCOPY;  Service: Gastroenterology;  Laterality: N/A;  . COLOSTOMY REVISION Right 05/04/2019   Procedure: COLON RESECTION RIGHT-right hemicolectomy-open;  Surgeon: Fredirick Maudlin, MD;  Location: [facility] ORS;  Service: General;  Laterality: Right;  . ESOPHAGOGASTRODUODENOSCOPY (EGD) WITH PROPOFOL N/A 03/27/2019   Procedure: ESOPHAGOGASTRODUODENOSCOPY (EGD) WITH PROPOFOL;  Surgeon: Jonathon Bellows, MD;  Location: [facility] ENDOSCOPY;  Service: Gastroenterology;  Laterality: N/A;  . IR GENERIC HISTORICAL  05/31/2016   IR ANGIO VERTEBRAL SEL VERTEBRAL BILAT MOD SED 05/31/2016 Consuella Lose, MD [facility]-INTERV RAD  . IR GENERIC HISTORICAL  05/31/2016   IR ANGIO INTRA EXTRACRAN SEL INTERNAL CAROTID BILAT MOD SED 05/31/2016 Consuella Lose, MD [facility]INTER[facility]  . PORTACATH PLACEMENT Right 02/04/2019   Procedure: INSERTION PORT-A-CATH;  Surgeon: [name], MD;  Location: [facility] ORS;  Service: General;  Laterality: Right;  . THORACOTOMY Left 01/22/2019   Procedure: PRE OP BRONCH LEFT ANTERIOR THORACOTOMY WITH BIOPSY OF MEDIASTINAL MASS;  Surgeon: [name], MD;  Location: [facility] ORS;  Service: General;  Laterality: Left;  SOCIAL HISTORY: Social History   Socioeconomic History  . Marital status: Married    Spouse name: Not on file  . Number of children: Not on file  . Years of education: Not on file  . Highest education level: Not on  file  Occupational History  . Not on file  Tobacco Use  . Smoking status: Current Every Day Smoker    Packs/day: 0.50    Years: 50.00    Pack years: 25.00    Types: Cigarettes  . Smokeless tobacco: Never Used  Vaping Use  . Vaping Use: Every day  . Substances: Nicotine  Substance and Sexual Activity  . Alcohol use: No  . Drug use: Yes    Types: Barbituates, Marijuana  . Sexual activity: Not on file  Other Topics Concern  . Not on file  Social History Narrative   Lives in [place]; wife/ son/grandson [custody]; smoker; vending business; hx of alcoholism- quit at 61.    Social Determinants of Health   Financial Resource Strain: Low Risk   . Difficulty of Paying Living Expenses: Not hard at all  Food Insecurity: No Food Insecurity  . Worried About Running Out of Food in the Last Year: Never true  . Ran Out of Food in the Last Year: Never true  Transportation Needs: No Transportation Needs  . Lack of Transportation (Medical): No  . Lack of Transportation (Non-Medical): No  Physical Activity: Inactive  . Days of Exercise per Week: 0 days  . Minutes of Exercise per Session: 0 min  Stress: No Stress Concern Present  . Feeling of Stress : Not at all  Social Connections: Unknown  . Frequency of Communication with Friends and Family: Patient refused  . Frequency of Social Gatherings with Friends and Family: Patient refused  . Attends Religious Services: Patient refused  . Active Member of Clubs or Organizations: Patient refused  . Attends Club or Organization Meetings: Patient refused  . Marital Status: Patient refused  Intimate Partner Violence: Not At Risk  . Fear of Current or Ex-Partner: No  . Emotionally Abused: No  . Physically Abused: No  . Sexually Abused: No    FAMILY HISTORY: Family History  Problem Relation Age of Onset  . Lymphoma Father   . Liver cancer Paternal Uncle   . Lung cancer Paternal Uncle   . Lung cancer Maternal Uncle     ALLERGIES:  is  allergic to ferumoxytol, amoxicillin, and codeine.  MEDICATIONS:  Current Outpatient Medications  Medication Sig Dispense Refill  . amLODipine (NORVASC) 10 MG tablet Take 10 mg by mouth every morning.     . budesonide-formoterol (SYMBICORT) 160-4.5 MCG/ACT inhaler Inhale 2 puffs into the lungs 2 (two) times daily.    . DULoxetine (CYMBALTA) 30 MG capsule Take 30 mg by mouth every morning.     . ferrous sulfate 324 (65 Fe) MG TBEC Take 324 mg by mouth daily.     . gabapentin (NEURONTIN) 300 MG capsule Take 600 mg by mouth 3 (three) times daily.     . hydrochlorothiazide (HYDRODIURIL) 25 MG tablet Take 25 mg by mouth daily.     . HYDROcodone-acetaminophen (NORCO/VICODIN) 5-325 MG tablet Take 1 tablet by mouth every 8 (eight) hours as needed for moderate pain. 45 tablet 0  . insulin glargine (LANTUS) 100 UNIT/ML injection Inject into the skin daily. Units based By sliding scale    . Ipratropium-Albuterol (COMBIVENT) 20-100 MCG/ACT AERS respimat Inhale into the lungs.    . lidocaine-prilocaine (EMLA) cream Apply   1 application topically as needed. 30-45 mins prior to port access. 30 g 0  . linaclotide (LINZESS) 145 MCG CAPS capsule Take 145 mcg by mouth daily before breakfast.    . lovastatin (MEVACOR) 20 MG tablet Take 20 mg by mouth every evening.    . metFORMIN (GLUCOPHAGE) 1000 MG tablet Take 1,000 mg by mouth 2 (two) times daily with a meal.     . metFORMIN (GLUCOPHAGE-XR) 500 MG 24 hr tablet Take 1,000 mg by mouth 2 (two) times daily.    . ondansetron (ZOFRAN) 8 MG tablet Take 1 tablet (8 mg total) by mouth every 8 (eight) hours as needed for nausea or vomiting (start 3 days; after chemo). 40 tablet 1  . polyethylene glycol powder (MIRALAX) 17 GM/SCOOP powder Mix full container in 64 ounces of Gatorade or other clear liquid. NO RED Liquids 238 g 0  . prazosin (MINIPRESS) 1 MG capsule Take 1 mg by mouth at bedtime.    . psyllium (METAMUCIL) 58.6 % powder Take 1 packet by mouth daily.    .  pyridOXINE (VITAMIN B-6) 100 MG tablet Take 100 mg by mouth daily.    . sitaGLIPtin (JANUVIA) 25 MG tablet Take 25 mg by mouth daily.    . vitamin B-12 (CYANOCOBALAMIN) 100 MCG tablet Take 100 mcg by mouth daily.    . Multiple Vitamins-Minerals (MULTIVITAMIN WITH MINERALS) tablet Take 1 tablet by mouth daily.  (Patient not taking: Reported on 02/10/2020)    . omeprazole (PRILOSEC) 40 MG capsule Take 1 capsule (40 mg total) by mouth daily. (Patient not taking: Reported on 02/10/2020) 90 capsule 1   No current facility-administered medications for this visit.   Facility-Administered Medications Ordered in Other Visits  Medication Dose Route Frequency Provider Last Rate Last Admin  . fluorouracil (ADRUCIL) 4,200 mg in sodium chloride 0.9 % 66 mL chemo infusion  2,400 mg/m2 (Treatment Plan Recorded) Intravenous 1 day or 1 dose Brahmanday, Govinda R, MD   4,200 mg at 02/10/20 1350  . heparin lock flush 100 unit/mL  500 Units Intravenous Once Brahmanday, Govinda R, MD      . heparin lock flush 100 unit/mL  500 Units Intracatheter Once PRN Brahmanday, Govinda R, MD          .  PHYSICAL EXAMINATION: ECOG PERFORMANCE STATUS: 0 - Asymptomatic  Vitals:   02/10/20 0910  BP: (!) 144/70  Pulse: 71  Resp: 16  Temp: (!) 96.4 F (35.8 C)  SpO2: 100%   Filed Weights   02/10/20 0910  Weight: 138 lb (62.6 kg)    Physical Exam HENT:     Head: Normocephalic and atraumatic.     Mouth/Throat:     Pharynx: No oropharyngeal exudate.  Eyes:     Pupils: Pupils are equal, round, and reactive to light.  Cardiovascular:     Rate and Rhythm: Normal rate and regular rhythm.  Pulmonary:     Effort: No respiratory distress.     Breath sounds: No wheezing.  Abdominal:     General: Bowel sounds are normal. There is no distension.     Palpations: Abdomen is soft. There is no mass.     Tenderness: There is no abdominal tenderness. There is no guarding or rebound.     Comments: Abdominal incision  well-healed.  Musculoskeletal:        General: No tenderness. Normal range of motion.     Cervical back: Normal range of motion and neck supple.  Skin:    General: Skin   is warm.  Neurological:     Mental Status: He is alert and oriented to person, place, and time.  Psychiatric:        Mood and Affect: Affect normal.     LABORATORY DATA:  I have reviewed the data as listed Lab Results  Component Value Date   WBC 7.2 02/10/2020   HGB 12.2 (L) 02/10/2020   HCT 35.8 (L) 02/10/2020   MCV 82.5 02/10/2020   PLT 137 (L) 02/10/2020   Recent Labs    01/13/20 0801 01/27/20 0821 02/10/20 0900  NA 135 134* 133*  K 4.1 4.0 4.5  CL 98 98 101  CO2 28 25 24  GLUCOSE 202* 254* 249*  BUN 16 24* 16  CREATININE 0.92 0.86 0.96  CALCIUM 8.7* 9.2 8.8*  GFRNONAA >60 >60 >60  GFRAA >60 >60 >60  PROT 7.0 7.2 7.3  ALBUMIN 3.7 4.1 4.0  AST 18 21 19  ALT 16 18 18  ALKPHOS 73 68 66  BILITOT 0.5 0.8 0.5    RADIOGRAPHIC STUDIES: I have personally reviewed the radiological images as listed and agreed with the findings in the report. MR Lumbar Spine W Wo Contrast  Result Date: 01/13/2020 CLINICAL DATA:  Lower back pain radiating into both legs EXAM: MRI LUMBAR SPINE WITHOUT AND WITH CONTRAST TECHNIQUE: Multiplanar and multiecho pulse sequences of the lumbar spine were obtained without and with intravenous contrast. CONTRAST:  6mL GADAVIST GADOBUTROL 1 MMOL/ML IV SOLN COMPARISON:  None. FINDINGS: Segmentation: There are 5 non-rib bearing lumbar type vertebral bodies with the last intervertebral disc space labeled as L5-S1. Alignment:  There is a minimal anterolisthesis of L4 on L5. Vertebrae: The vertebral body heights are well maintained. No fracture, marrow edema,or pathologic marrow infiltration. Endplate reactive changes are seen at L5-S1 with anterior osteophyte formation. Conus medullaris and cauda equina: Conus extends to the L1 level. Conus and cauda equina appear normal. Paraspinal and other  soft tissues: The paraspinal soft tissues and visualized retroperitoneal structures are unremarkable. The sacroiliac joints are intact. Disc levels: T12-L1:  No significant canal or neural foraminal narrowing. L1-L2:   No significant canal or neural foraminal narrowing. L2-L3:   No significant canal or neural foraminal narrowing. L3-L4: There is a broad-based disc bulge with ligamentum flavum hypertrophy and facet arthrosis which causes mild bilateral neural foraminal narrowing. L4-L5: There is a broad-based disc bulge with disc uncovering facet arthrosis and ligamentum flavum hypertrophy which causes moderate to severe bilateral neural foraminal narrowing and effacement of the thecal sac which measures 4 mm in AP diameter. L5-S1: There is a large anterior disc osteophyte complex present. There is a broad-based disc bulge with facet arthrosis and ligamentum flavum hypertrophy which causes severe bilateral neural foraminal narrowing and mild central canal stenosis. IMPRESSION: 1. 2. Minimal grade 1 anterolisthesis of L4 on L5. 3. 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 4. Also at L5-S1 there is severe bilateral neural foraminal narrowing and mild central canal stenosis. Electronically Signed   By: Bindu  Avutu M.D.   On: 01/13/2020 03:55    ASSESSMENT & PLAN:   Cancer of ascending colon (HCC) #Right colon cancer stage IV; adenocarcinoma [s/p liver biopsy]-Currentlly on FOLFOX + avastin.  Foundation 1- WILD RAS mutations. STABLE.   # proceed with 5FU with Avastin; 80% oxaliplatin [see below-severe fatigue] Labs today reviewed;  acceptable for treatment today. Will order imaging today   #Extreme fatigue-secondary to oxaliplatin/poorly   controlled blood sugars- STABLE; continue 80% oxaliplatin today.  # Anemia- secondary- sec to chemo-hemoglobin on 11-12-stable  # Back pain- MRI lumbar spine- July 2021-NEG  for mets; severe arthritic changes; spinal canal stenosis.on hydrocodone prn [twice a week prn]- STABLE.   # Poorly controlled diabetes blood sugars-better controlled as per patients.; Stable- BG this AM 228.  Recommend close monitoring.  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  S/p concurrent radiation x4 cycles.  CT scan August 20, 2019- STABLE  # HTN- 140s/ 80-on Avastin. STABLE  # DISPOSITION: # today FOLFOX + avastin;  d-3- pump de-access # follow up in 2 weeks-MD; labs-cbc/cmp/cea; UA; FOLFOX chemo+ Avastin; D-3 pump de-access; CT C/A/P prior- Dr.B   All questions were answered. The patient knows to call the clinic with any problems, questions or concerns.    Govinda R Brahmanday, MD 02/10/2020 1:53 PM   

## 2020-02-10 NOTE — Assessment & Plan Note (Addendum)
#  Right colon cancer stage IV; adenocarcinoma [s/p liver biopsy]-Currentlly on FOLFOX + avastin.  Foundation 1- WILD RAS mutations. STABLE.   # proceed with 5FU with Avastin; 80% oxaliplatin [see below-severe fatigue] Labs today reviewed;  acceptable for treatment today. Will order imaging today   #Extreme fatigue-secondary to oxaliplatin/poorly controlled blood sugars- STABLE; continue 80% oxaliplatin today.  # Anemia- secondary- sec to chemo-hemoglobin on 11-12-stable  # Back pain- MRI lumbar spine- July 2021-NEG for mets; severe arthritic changes; spinal canal stenosis.on hydrocodone prn [twice a week prn]- STABLE.   # Poorly controlled diabetes blood sugars-better controlled as per patients.; Stable- BG this AM 228.  Recommend close monitoring.  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  S/p concurrent radiation x4 cycles.  CT scan August 20, 2019- STABLE  # HTN- 140s/ 80-on Avastin. STABLE  # DISPOSITION: # today FOLFOX + avastin;  d-3- pump de-access # follow up in 2 weeks-MD; labs-cbc/cmp/cea; UA; FOLFOX chemo+ Avastin; D-3 pump de-access; CT C/A/P prior- Dr.B

## 2020-02-11 LAB — CEA: CEA: 5.7 ng/mL — ABNORMAL HIGH (ref 0.0–4.7)

## 2020-02-12 ENCOUNTER — Inpatient Hospital Stay: Payer: BC Managed Care – PPO

## 2020-02-12 ENCOUNTER — Other Ambulatory Visit: Payer: Self-pay

## 2020-02-12 VITALS — BP 134/76 | HR 72 | Temp 98.8°F | Resp 16

## 2020-02-12 DIAGNOSIS — Z95828 Presence of other vascular implants and grafts: Secondary | ICD-10-CM

## 2020-02-12 DIAGNOSIS — Z5111 Encounter for antineoplastic chemotherapy: Secondary | ICD-10-CM | POA: Diagnosis not present

## 2020-02-12 MED ORDER — SODIUM CHLORIDE 0.9% FLUSH
10.0000 mL | INTRAVENOUS | Status: DC | PRN
  Administered 2020-02-12: 10 mL via INTRAVENOUS
  Filled 2020-02-12: qty 10

## 2020-02-12 MED ORDER — HEPARIN SOD (PORK) LOCK FLUSH 100 UNIT/ML IV SOLN
500.0000 [IU] | Freq: Once | INTRAVENOUS | Status: AC
  Administered 2020-02-12: 500 [IU] via INTRAVENOUS
  Filled 2020-02-12: qty 5

## 2020-02-15 ENCOUNTER — Telehealth: Payer: Self-pay | Admitting: Licensed Clinical Social Worker

## 2020-02-15 NOTE — Telephone Encounter (Signed)
Called patient to reschedule radiation appointment. I left a message on patient's cell voicemail for him to call us back.

## 2020-02-17 ENCOUNTER — Ambulatory Visit: Payer: BC Managed Care – PPO | Admitting: Radiation Oncology

## 2020-02-23 ENCOUNTER — Other Ambulatory Visit: Payer: Self-pay

## 2020-02-23 ENCOUNTER — Ambulatory Visit
Admission: RE | Admit: 2020-02-23 | Discharge: 2020-02-23 | Disposition: A | Discharge status: Home / Self Care | Payer: BC Managed Care – PPO | Source: Ambulatory Visit | Attending: Internal Medicine | Admitting: Internal Medicine

## 2020-02-23 ENCOUNTER — Encounter: Payer: Self-pay | Admitting: Internal Medicine

## 2020-02-23 DIAGNOSIS — C801 Malignant (primary) neoplasm, unspecified: Secondary | ICD-10-CM | POA: Insufficient documentation

## 2020-02-23 DIAGNOSIS — C781 Secondary malignant neoplasm of mediastinum: Secondary | ICD-10-CM | POA: Insufficient documentation

## 2020-02-23 DIAGNOSIS — C182 Malignant neoplasm of ascending colon: Secondary | ICD-10-CM | POA: Insufficient documentation

## 2020-02-23 MED ORDER — IOHEXOL 300 MG/ML  SOLN
100.0000 mL | Freq: Once | INTRAMUSCULAR | Status: AC | PRN
  Administered 2020-02-23: 100 mL via INTRAVENOUS

## 2020-02-23 NOTE — Progress Notes (Signed)
Patient called for pre assessment. States he is not sleeping well due to pain with teeth.  States 2 -3 days ago he bit down on something too hard and his left lower teeth have been hurting since. He states he knows he needs to go to dentist but has not been yet. Rates pain at 8. He denies other concerns at this time.

## 2020-02-24 ENCOUNTER — Inpatient Hospital Stay: Payer: BC Managed Care – PPO | Attending: Internal Medicine

## 2020-02-24 ENCOUNTER — Inpatient Hospital Stay: Payer: BC Managed Care – PPO

## 2020-02-24 ENCOUNTER — Inpatient Hospital Stay (HOSPITAL_BASED_OUTPATIENT_CLINIC_OR_DEPARTMENT_OTHER): Payer: BC Managed Care – PPO | Admitting: Internal Medicine

## 2020-02-24 ENCOUNTER — Other Ambulatory Visit: Payer: Self-pay

## 2020-02-24 VITALS — BP 132/66 | HR 61

## 2020-02-24 DIAGNOSIS — Z79899 Other long term (current) drug therapy: Secondary | ICD-10-CM | POA: Insufficient documentation

## 2020-02-24 DIAGNOSIS — C781 Secondary malignant neoplasm of mediastinum: Secondary | ICD-10-CM

## 2020-02-24 DIAGNOSIS — R5383 Other fatigue: Secondary | ICD-10-CM | POA: Diagnosis not present

## 2020-02-24 DIAGNOSIS — F17210 Nicotine dependence, cigarettes, uncomplicated: Secondary | ICD-10-CM | POA: Diagnosis not present

## 2020-02-24 DIAGNOSIS — G629 Polyneuropathy, unspecified: Secondary | ICD-10-CM | POA: Insufficient documentation

## 2020-02-24 DIAGNOSIS — E1142 Type 2 diabetes mellitus with diabetic polyneuropathy: Secondary | ICD-10-CM | POA: Diagnosis not present

## 2020-02-24 DIAGNOSIS — J449 Chronic obstructive pulmonary disease, unspecified: Secondary | ICD-10-CM | POA: Diagnosis not present

## 2020-02-24 DIAGNOSIS — Z5111 Encounter for antineoplastic chemotherapy: Secondary | ICD-10-CM | POA: Diagnosis not present

## 2020-02-24 DIAGNOSIS — Z794 Long term (current) use of insulin: Secondary | ICD-10-CM | POA: Insufficient documentation

## 2020-02-24 DIAGNOSIS — C182 Malignant neoplasm of ascending colon: Secondary | ICD-10-CM

## 2020-02-24 DIAGNOSIS — C771 Secondary and unspecified malignant neoplasm of intrathoracic lymph nodes: Secondary | ICD-10-CM | POA: Diagnosis not present

## 2020-02-24 DIAGNOSIS — C787 Secondary malignant neoplasm of liver and intrahepatic bile duct: Secondary | ICD-10-CM | POA: Insufficient documentation

## 2020-02-24 DIAGNOSIS — I739 Peripheral vascular disease, unspecified: Secondary | ICD-10-CM | POA: Insufficient documentation

## 2020-02-24 DIAGNOSIS — I10 Essential (primary) hypertension: Secondary | ICD-10-CM | POA: Diagnosis not present

## 2020-02-24 DIAGNOSIS — F1011 Alcohol abuse, in remission: Secondary | ICD-10-CM | POA: Insufficient documentation

## 2020-02-24 DIAGNOSIS — C3490 Malignant neoplasm of unspecified part of unspecified bronchus or lung: Secondary | ICD-10-CM | POA: Insufficient documentation

## 2020-02-24 DIAGNOSIS — F329 Major depressive disorder, single episode, unspecified: Secondary | ICD-10-CM | POA: Diagnosis not present

## 2020-02-24 DIAGNOSIS — Z7189 Other specified counseling: Secondary | ICD-10-CM

## 2020-02-24 DIAGNOSIS — C801 Malignant (primary) neoplasm, unspecified: Secondary | ICD-10-CM

## 2020-02-24 LAB — URINALYSIS, COMPLETE (UACMP) WITH MICROSCOPIC
Bilirubin Urine: NEGATIVE
Glucose, UA: >=500 mg/dL — AB
Hgb urine dipstick: NEGATIVE
Ketones, ur: NEGATIVE mg/dL
Nitrite: NEGATIVE
Protein, ur: NEGATIVE mg/dL
Specific Gravity, Urine: 1.016 (ref 1.005–1.030)
pH: 5 (ref 5.0–8.0)

## 2020-02-24 LAB — COMPREHENSIVE METABOLIC PANEL
ALT: 17 U/L (ref 0–44)
AST: 20 U/L (ref 15–41)
Albumin: 3.6 g/dL (ref 3.5–5.0)
Alkaline Phosphatase: 59 U/L (ref 38–126)
Anion gap: 10 (ref 5–15)
BUN: 33 mg/dL — ABNORMAL HIGH (ref 8–23)
CO2: 26 mmol/L (ref 22–32)
Calcium: 8.6 mg/dL — ABNORMAL LOW (ref 8.9–10.3)
Chloride: 98 mmol/L (ref 98–111)
Creatinine, Ser: 0.96 mg/dL (ref 0.61–1.24)
GFR calc Af Amer: >60 mL/min (ref >60)
GFR calc non Af Amer: >60 mL/min (ref >60)
Glucose, Bld: 196 mg/dL — ABNORMAL HIGH (ref 70–99)
Potassium: 4.1 mmol/L (ref 3.5–5.1)
Sodium: 134 mmol/L — ABNORMAL LOW (ref 135–145)
Total Bilirubin: 0.3 mg/dL (ref 0.3–1.2)
Total Protein: 6.8 g/dL (ref 6.5–8.1)

## 2020-02-24 LAB — CBC WITH DIFFERENTIAL/PLATELET
Abs Immature Granulocytes: 0.02 10*3/uL (ref 0.00–0.07)
Basophils Absolute: 0 10*3/uL (ref 0.0–0.1)
Basophils Relative: 1 %
Eosinophils Absolute: 0.4 10*3/uL (ref 0.0–0.5)
Eosinophils Relative: 6 %
HCT: 32.8 % — ABNORMAL LOW (ref 39.0–52.0)
Hemoglobin: 11.3 g/dL — ABNORMAL LOW (ref 13.0–17.0)
Immature Granulocytes: 0 %
Lymphocytes Relative: 27 %
Lymphs Abs: 1.7 10*3/uL (ref 0.7–4.0)
MCH: 29 pg (ref 26.0–34.0)
MCHC: 34.5 g/dL (ref 30.0–36.0)
MCV: 84.1 fL (ref 80.0–100.0)
Monocytes Absolute: 0.8 10*3/uL (ref 0.1–1.0)
Monocytes Relative: 13 %
Neutro Abs: 3.4 10*3/uL (ref 1.7–7.7)
Neutrophils Relative %: 53 %
Platelets: 118 10*3/uL — ABNORMAL LOW (ref 150–400)
RBC: 3.9 MIL/uL — ABNORMAL LOW (ref 4.22–5.81)
RDW: 18.8 % — ABNORMAL HIGH (ref 11.5–15.5)
WBC: 6.3 10*3/uL (ref 4.0–10.5)
nRBC: 0 % (ref 0.0–0.2)

## 2020-02-24 MED ORDER — SODIUM CHLORIDE 0.9 % IV SOLN
5.0000 mg/kg | Freq: Once | INTRAVENOUS | Status: AC
  Administered 2020-02-24: 300 mg via INTRAVENOUS
  Filled 2020-02-24: qty 12

## 2020-02-24 MED ORDER — SODIUM CHLORIDE 0.9 % IV SOLN: 4.0000 mg | Freq: Once | INTRAVENOUS | Status: DC

## 2020-02-24 MED ORDER — SODIUM CHLORIDE 0.9 % IV SOLN
2400.0000 mg/m2 | INTRAVENOUS | Status: DC
  Administered 2020-02-24: 4200 mg via INTRAVENOUS
  Filled 2020-02-24: qty 84

## 2020-02-24 MED ORDER — SODIUM CHLORIDE 0.9 % IV SOLN
Freq: Once | INTRAVENOUS | Status: AC
  Filled 2020-02-24: qty 250

## 2020-02-24 MED ORDER — DEXAMETHASONE SODIUM PHOSPHATE 10 MG/ML IJ SOLN
4.0000 mg | Freq: Once | INTRAMUSCULAR | Status: AC
  Administered 2020-02-24: 4 mg via INTRAVENOUS
  Filled 2020-02-24: qty 1

## 2020-02-24 NOTE — Progress Notes (Signed)
0915: Per Dr. Rogue Bussing pt to receive Noah Charon and Adrucil pump only today, no Oxaliplatin and Leucovorin at this time.

## 2020-02-24 NOTE — Progress Notes (Signed)
[facility] NOTE  Patient Care Team: Laneta Simmers, NP as PCP - General (Nurse Practitioner) Telford Nab, RN as Registered Nurse Clent Jacks, RN as Oncology Nurse Navigator  CHIEF COMPLAINTS/PURPOSE OF CONSULTATION: Lung cancer/colon cancer   Oncology History Overview Note  # July 2020- SMALL CELL CA METASTATIC TO MEDIASTINAL LN [open Bx; Dr.Oaks]; TxN2M0; July 2nd 2020-PET- 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  MRI brain negative  # aug 17th 2020-carbo etoposide-RT [? 8/19]; #4 cycle carbo-Etop [finished dec 30th,2020]  #August 2020 iron deficient anemia-question etiology; IV Feraheme [no colonoscopy]  # OCT 2020- colo/ Cecal adeno ca; MRI liver- 1 cm enhancing lesion "metastasis" [Dr.Anna/Dr.Cannon]-difficulty biopsy.  November 02-2019 right hemicolectomy- STAGE III [pT3pN1 (2/13LN)]- HOLD adjuvant therapy. MMR- INTACT/LOW  #May 2021-liver biopsy-possible adenocarcinoma; colorectal origin.  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 ([place])  01/29/2019 Initial Diagnosis   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 [facility])   02/09/2019 - 07/12/2019 Chemotherapy   The patient had palonosetron (ALOXI) injection 0.25 mg, 0.25 mg, Intravenous,  Once, 4 of 4 cycles Administration: 0.25 mg (02/09/2019), 0.25 mg  (03/03/2019), 0.25 mg (06/02/2019), 0.25 mg (06/22/2019) CARBOplatin (PARAPLATIN) 410 mg in sodium chloride 0.9 % 250 mL chemo infusion, 410 mg (100 % of original dose 414 mg), Intravenous,  Once, 4 of 4 cycles Dose modification:   (original dose 414 mg, Cycle 1) Administration: 410 mg (02/09/2019), 410 mg (03/03/2019), 410 mg (06/02/2019), 410 mg (06/22/2019) etoposide (VEPESID) 180 mg in sodium chloride 0.9 % 500 mL chemo infusion, 100 mg/m2 = 180 mg, Intravenous,  Once, 4 of 4 cycles Administration: 180 mg (02/09/2019), 180 mg (02/10/2019), 180 mg (02/11/2019), 180 mg (03/03/2019), 180 mg (03/04/2019), 180 mg (03/05/2019), 180 mg (06/02/2019), 180 mg (06/03/2019), 180 mg (06/04/2019), 180 mg (06/22/2019), 180 mg (06/23/2019), 180 mg (06/24/2019) fosaprepitant (EMEND) 150 mg, dexamethasone (DECADRON) 6 mg in sodium chloride 0.9 % 145 mL IVPB, , Intravenous,  Once, 2 of 2 cycles Administration:  (06/02/2019),  (06/22/2019)  for chemotherapy treatment.    Cancer of ascending colon ([place])  05/04/2019 Initial Diagnosis   Cancer of ascending colon ([place])   11/30/2019 -  Chemotherapy   The patient had dexamethasone (DECADRON) 4 MG tablet, 8 mg, Oral, Daily, 1 of 1 cycle, Start date: --, End date: -- palonosetron (ALOXI) injection 0.25 mg, 0.25 mg, Intravenous,  Once, 5 of 5 cycles Administration: 0.25 mg (11/30/2019), 0.25 mg (01/13/2020), 0.25 mg (01/27/2020), 0.25 mg (12/14/2019), 0.25 mg (02/10/2020) leucovorin 700 mg in dextrose 5 % 250 mL infusion, 400 mg/m2 = 700 mg, Intravenous,  Once, 6 of 6 cycles Administration: 700 mg (11/30/2019), 700 mg (12/29/2019), 700 mg (01/13/2020), 700 mg (01/27/2020), 700 mg (12/14/2019), 700 mg (02/10/2020) oxaliplatin (ELOXATIN) 150 mg in dextrose 5 % 500 mL chemo infusion, 85 mg/m2 = 150 mg, Intravenous,  Once, 5 of 5 cycles Dose modification: 68 mg/m2 (original dose 85  mg/m2, Cycle 4, Reason: Provider Judgment) Administration: 120 mg (01/13/2020), 120 mg (01/27/2020), 150 mg (12/14/2019), 120 mg  (02/10/2020) fluorouracil (ADRUCIL) 4,200 mg in sodium chloride 0.9 % 66 mL chemo infusion, 2,400 mg/m2 = 4,200 mg, Intravenous, 1 Day/Dose, 7 of 12 cycles Administration: 4,200 mg (11/30/2019), 4,200 mg (12/29/2019), 4,200 mg (01/13/2020), 4,200 mg (01/27/2020), 4,200 mg (12/14/2019), 4,200 mg (02/10/2020) bevacizumab-bvzr (ZIRABEV) 300 mg in sodium chloride 0.9 % 100 mL chemo infusion, 5 mg/kg = 300 mg, Intravenous,  Once, 7 of 12 cycles Administration: 300 mg (12/29/2019), 300 mg (01/13/2020), 300 mg (01/27/2020), 300 mg (12/14/2019), 300 mg (02/10/2020)  for chemotherapy treatment.     HISTORY OF PRESENTING ILLNESS:   Kevin Mckay 69 y.o.  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  Patient continues to complain of worsening fatigue.  Complains of tingling and numbness.  Complains of pain in his teeth.  Poor appetite.  However symptoms improved few days after chemotherapy.  Continues to chronic shortness of breath chronic cough.  Not any worse.  Review of Systems  Constitutional: Positive for malaise/fatigue and weight loss. Negative for chills, diaphoresis and fever.  HENT: Negative for nosebleeds and sore throat.   Eyes: Negative for double vision.  Respiratory: Positive for shortness of breath. Negative for hemoptysis and wheezing.   Cardiovascular: Positive for leg swelling. Negative for chest pain, palpitations and orthopnea.  Gastrointestinal: Negative for abdominal pain, blood in stool, constipation, diarrhea, heartburn, melena, nausea and vomiting.  Genitourinary: Negative for dysuria, frequency and urgency.  Musculoskeletal: Positive for back pain, joint pain and myalgias.  Skin: Negative.  Negative for itching and rash.  Neurological: Positive for tingling. Negative for dizziness, focal weakness and weakness.   Endo/Heme/Allergies: Does not bruise/bleed easily.  Psychiatric/Behavioral: Negative for depression. The patient is not nervous/anxious and does not have insomnia.      MEDICAL HISTORY:  Past Medical History:  Diagnosis Date  . Asthma   . Cancer (HCC)    Metastatic small cell lung cancer  . Cerebral aneurysm   . Chronic painful diabetic neuropathy (HCC)   . Depression   . Diabetes mellitus without complication (HCC)   . Essential hypertension   . History of kidney stones   . Occasional tremors   . Tobacco use     SURGICAL HISTORY: Past Surgical History:  Procedure Laterality Date  . ABDOMINAL SURGERY     age 45. trauma surgery due to forklift injury- liver and spleen  . COLONOSCOPY WITH PROPOFOL N/A 03/27/2019   Procedure: COLONOSCOPY WITH PROPOFOL;  Surgeon: Wyline Mood, MD;  Location: [facility] ENDOSCOPY;  Service: Gastroenterology;  Laterality: N/A;  . COLOSTOMY REVISION Right 05/04/2019   Procedure: COLON RESECTION RIGHT-right hemicolectomy-open;  Surgeon: Duanne Guess, MD;  Location: [facility] ORS;  Service: General;  Laterality: Right;  . ESOPHAGOGASTRODUODENOSCOPY (EGD) WITH PROPOFOL N/A 03/27/2019   Procedure: ESOPHAGOGASTRODUODENOSCOPY (EGD) WITH PROPOFOL;  Surgeon: Wyline Mood, MD;  Location: [facility] ENDOSCOPY;  Service: Gastroenterology;  Laterality: N/A;  . IR GENERIC HISTORICAL  05/31/2016   IR ANGIO VERTEBRAL SEL VERTEBRAL BILAT MOD SED 05/31/2016 Lisbeth Renshaw, MD [facility]-INTERV RAD  . IR GENERIC HISTORICAL  05/31/2016   IR ANGIO INTRA EXTRACRAN SEL INTERNAL CAROTID BILAT MOD SED 05/31/2016 Lisbeth Renshaw, MD [facility]INTER[facility]  . PORTACATH PLACEMENT Right 02/04/2019   Procedure: INSERTION PORT-A-CATH;  Surgeon: Hulda Marin, MD;  Location: [facility]  ORS;  Service: General;  Laterality: Right;  . THORACOTOMY Left 01/22/2019   Procedure: PRE OP BRONCH LEFT ANTERIOR THORACOTOMY WITH BIOPSY OF MEDIASTINAL MASS;  Surgeon: [name], MD;  Location: [facility] ORS;  Service: General;   Laterality: Left;    SOCIAL HISTORY: Social History   Socioeconomic History  . Marital status: Married    Spouse name: Not on file  . Number of children: Not on file  . Years of education: Not on file  . Highest education level: Not on file  Occupational History  . Not on file  Tobacco Use  . Smoking status: Current Every Day Smoker    Packs/day: 0.50    Years: 50.00    Pack years: 25.00    Types: Cigarettes  . Smokeless tobacco: Never Used  Vaping Use  . Vaping Use: Every day  . Substances: Nicotine  Substance and Sexual Activity  . Alcohol use: No  . Drug use: Yes    Types: Barbituates, Marijuana  . Sexual activity: Not on file  Other Topics Concern  . Not on file  Social History Narrative   Lives in [place]; wife/ son/grandson [custody]; smoker; vending business; hx of alcoholism- quit at 48.    Social Determinants of Health   Financial Resource Strain: Low Risk   . Difficulty of Paying Living Expenses: Not hard at all  Food Insecurity: No Food Insecurity  . Worried About Charity fundraiser in the Last Year: Never true  . Ran Out of Food in the Last Year: Never true  Transportation Needs: No Transportation Needs  . Lack of Transportation (Medical): No  . Lack of Transportation (Non-Medical): No  Physical Activity: Inactive  . Days of Exercise per Week: 0 days  . Minutes of Exercise per Session: 0 min  Stress: No Stress Concern Present  . Feeling of Stress : Not at all  Social Connections: Unknown  . Frequency of Communication with Friends and Family: Patient refused  . Frequency of Social Gatherings with Friends and Family: Patient refused  . Attends Religious Services: Patient refused  . Active Member of Clubs or Organizations: Patient refused  . Attends Archivist Meetings: Patient refused  . Marital Status: Patient refused  Intimate Partner Violence: Not At Risk  . Fear of Current or Ex-Partner: No  . Emotionally Abused: No  . Physically  Abused: No  . Sexually Abused: No    FAMILY HISTORY: Family History  Problem Relation Age of Onset  . Lymphoma Father   . Liver cancer Paternal Uncle   . Lung cancer Paternal Uncle   . Lung cancer Maternal Uncle     ALLERGIES:  is allergic to ferumoxytol, amoxicillin, and codeine.  MEDICATIONS:  Current Outpatient Medications  Medication Sig Dispense Refill  . amLODipine (NORVASC) 10 MG tablet Take 10 mg by mouth every morning.     . budesonide-formoterol (SYMBICORT) 160-4.5 MCG/ACT inhaler Inhale 2 puffs into the lungs 2 (two) times daily.    . DULoxetine (CYMBALTA) 30 MG capsule Take 30 mg by mouth every morning.     . ferrous sulfate 324 (65 Fe) MG TBEC Take 324 mg by mouth daily.     Marland Kitchen gabapentin (NEURONTIN) 300 MG capsule Take 600 mg by mouth 3 (three) times daily.     . hydrochlorothiazide (HYDRODIURIL) 25 MG tablet Take 25 mg by mouth daily.     Marland Kitchen HYDROcodone-acetaminophen (NORCO/VICODIN) 5-325 MG tablet Take 1 tablet by mouth every 8 (eight) hours as needed for moderate  pain. 45 tablet 0  . insulin glargine (LANTUS) 100 UNIT/ML injection Inject into the skin daily. Units based By sliding scale    . Ipratropium-Albuterol (COMBIVENT) 20-100 MCG/ACT AERS respimat Inhale into the lungs.    . lidocaine-prilocaine (EMLA) cream Apply 1 application topically as needed. 30-45 mins prior to port access. 30 g 0  . linaclotide (LINZESS) 145 MCG CAPS capsule Take 145 mcg by mouth daily before breakfast.    . lovastatin (MEVACOR) 20 MG tablet Take 20 mg by mouth every evening.    . metFORMIN (GLUCOPHAGE) 1000 MG tablet Take 1,000 mg by mouth 2 (two) times daily with a meal.     . metFORMIN (GLUCOPHAGE-XR) 500 MG 24 hr tablet Take 1,000 mg by mouth 2 (two) times daily.    . Multiple Vitamins-Minerals (MULTIVITAMIN WITH MINERALS) tablet Take 1 tablet by mouth daily.     Marland Kitchen omeprazole (PRILOSEC) 40 MG capsule Take 1 capsule (40 mg total) by mouth daily. 90 capsule 1  . ondansetron (ZOFRAN) 8  MG tablet Take 1 tablet (8 mg total) by mouth every 8 (eight) hours as needed for nausea or vomiting (start 3 days; after chemo). 40 tablet 1  . polyethylene glycol powder (MIRALAX) 17 GM/SCOOP powder Mix full container in 64 ounces of Gatorade or other clear liquid. NO RED Liquids 238 g 0  . prazosin (MINIPRESS) 1 MG capsule Take 1 mg by mouth at bedtime.    . psyllium (METAMUCIL) 58.6 % powder Take 1 packet by mouth daily.    Marland Kitchen pyridOXINE (VITAMIN B-6) 100 MG tablet Take 100 mg by mouth daily.    . sitaGLIPtin (JANUVIA) 25 MG tablet Take 25 mg by mouth daily.    . vitamin B-12 (CYANOCOBALAMIN) 100 MCG tablet Take 100 mcg by mouth daily.     No current facility-administered medications for this visit.   Facility-Administered Medications Ordered in Other Visits  Medication Dose Route Frequency Provider Last Rate Last Admin  . bevacizumab-bvzr (ZIRABEV) 300 mg in sodium chloride 0.9 % 100 mL chemo infusion  5 mg/kg (Treatment Plan Recorded) Intravenous Once Charlaine Dalton R, MD      . fluorouracil (ADRUCIL) 4,200 mg in sodium chloride 0.9 % 66 mL chemo infusion  2,400 mg/m2 (Treatment Plan Recorded) Intravenous 1 day or 1 dose Cammie Sickle, MD          .  PHYSICAL EXAMINATION: ECOG PERFORMANCE STATUS: 0 - Asymptomatic  Vitals:   02/24/20 0837  BP: 137/89  Pulse: 72  Resp: 16  Temp: (!) 95.5 F (35.3 C)  SpO2: 100%   Filed Weights   02/24/20 0837  Weight: 143 lb (64.9 kg)    Physical Exam HENT:     Head: Normocephalic and atraumatic.     Mouth/Throat:     Pharynx: No oropharyngeal exudate.  Eyes:     Pupils: Pupils are equal, round, and reactive to light.  Cardiovascular:     Rate and Rhythm: Normal rate and regular rhythm.  Pulmonary:     Effort: No respiratory distress.     Breath sounds: No wheezing.  Abdominal:     General: Bowel sounds are normal. There is no distension.     Palpations: Abdomen is soft. There is no mass.     Tenderness: There is no  abdominal tenderness. There is no guarding or rebound.     Comments: Abdominal incision well-healed.  Musculoskeletal:        General: No tenderness. Normal range of motion.  Cervical back: Normal range of motion and neck supple.  Skin:    General: Skin is warm.  Neurological:     Mental Status: He is alert and oriented to person, place, and time.  Psychiatric:        Mood and Affect: Affect normal.     LABORATORY DATA:  I have reviewed the data as listed Lab Results  Component Value Date   WBC 6.3 02/24/2020   HGB 11.3 (L) 02/24/2020   HCT 32.8 (L) 02/24/2020   MCV 84.1 02/24/2020   PLT 118 (L) 02/24/2020   Recent Labs    01/27/20 0821 02/10/20 0900 02/24/20 0819  NA 134* 133* 134*  K 4.0 4.5 4.1  CL 98 101 98  CO2 $Re'25 24 26  'TOR$ GLUCOSE 254* 249* 196*  BUN 24* 16 33*  CREATININE 0.86 0.96 0.96  CALCIUM 9.2 8.8* 8.6*  GFRNONAA >60 >60 >60  GFRAA >60 >60 >60  PROT 7.2 7.3 6.8  ALBUMIN 4.1 4.0 3.6  AST $Re'21 19 20  'wcQ$ ALT $R'18 18 17  'Pd$ ALKPHOS 68 66 59  BILITOT 0.8 0.5 0.3    RADIOGRAPHIC STUDIES: I have personally reviewed the radiological images as listed and agreed with the findings in the report. CT Chest W Contrast  Result Date: 02/23/2020 CLINICAL DATA:  Small-cell lung cancer, synchronous colon cancer, evaluate treatment response EXAM: CT CHEST, ABDOMEN, AND PELVIS WITH CONTRAST TECHNIQUE: Multidetector CT imaging of the chest, abdomen and pelvis was performed following the standard protocol during bolus administration of intravenous contrast. CONTRAST:  151mL OMNIPAQUE IOHEXOL 300 MG/ML SOLN, additional oral enteric contrast COMPARISON:  CT chest abdomen pelvis, 08/20/2019, MR abdomen, 10/21/2019 FINDINGS: CT CHEST FINDINGS Cardiovascular: Right chest port catheter. Aortic atherosclerosis. Aortic valve calcifications normal heart size. Three-vessel coronary artery calcifications no pericardial effusion. Mediastinum/Nodes: No enlarged mediastinal, hilar, or axillary lymph  nodes. No change in AP window soft tissue at the site of a previously PET avid lymph node (series 2, image 23). Thyroid gland, trachea, and esophagus demonstrate no significant findings. Lungs/Pleura: Diffuse bilateral bronchial wall thickening. Frothy debris in the right bronchus intermedius (series 3, image 77). Innumerable tiny centrilobular pulmonary nodules. Interval increase in dense consolidation, architectural distortion, and fibrosis of the suprahilar left upper lobe (series 3, image 54). No pleural effusion or pneumothorax. Musculoskeletal: No chest wall mass or suspicious bone lesions identified. CT ABDOMEN PELVIS FINDINGS Hepatobiliary: Hypodense lesion in the lateral liver dome, hepatic segment VII, measuring 1.3 x 1.0 cm (series 2, image 51). This is unchanged compared to prior CT, although smaller than the lesion on prior MR dated 10/21/2019. No gallstones, gallbladder wall thickening, or biliary dilatation. Pancreas: Unremarkable. No pancreatic ductal dilatation or surrounding inflammatory changes. Spleen: Normal in size without significant abnormality. Adrenals/Urinary Tract: Adrenal glands are unremarkable. Kidneys are normal, without renal calculi, solid lesion, or hydronephrosis. Bladder is unremarkable. Stomach/Bowel: Stomach is within normal limits. Redemonstrated postoperative findings of partial right colectomy and reanastomosis no evidence of bowel wall thickening, distention, or inflammatory changes. Descending and sigmoid diverticulosis. Large burden of stool throughout the colon. Vascular/Lymphatic: Aortic atherosclerosis. No enlarged abdominal or pelvic lymph nodes. Reproductive: No mass or other abnormality. Other: No abdominal wall hernia or abnormality. No abdominopelvic ascites. Musculoskeletal: No acute or significant osseous findings. IMPRESSION: 1. Interval increase in dense consolidation, architectural distortion, and fibrosis of the suprahilar left upper lobe, consistent with  expected development of post radiation fibrosis. 2. No change in AP window soft tissue at the site of a previously PET  avid lymph node. No evidence of recurrent disease. 3. Hypodense lesion in the lateral liver dome, hepatic segment VII, measuring 1.3 x 1.0 cm. This is unchanged compared to prior CT, although smaller than the lesion on prior MR dated 10/21/2019, likely reflecting cross modality differences in enhancement characteristics and appearance rather than significant interval change. 4. No evidence of new metastatic disease in the chest, abdomen, or pelvis. 5. Postoperative findings of right hemicolectomy. 6. Diffuse bilateral bronchial wall thickening and innumerable tiny centrilobular pulmonary nodules, likely smoking-related respiratory bronchiolitis. 7. Coronary artery disease.  Aortic Atherosclerosis (ICD10-I70.0). Electronically Signed   By: Lauralyn Primes M.D.   On: 02/23/2020 13:23   CT Abdomen Pelvis W Contrast  Result Date: 02/23/2020 CLINICAL DATA:  Small-cell lung cancer, synchronous colon cancer, evaluate treatment response EXAM: CT CHEST, ABDOMEN, AND PELVIS WITH CONTRAST TECHNIQUE: Multidetector CT imaging of the chest, abdomen and pelvis was performed following the standard protocol during bolus administration of intravenous contrast. CONTRAST:  OMNIPAQUE IOHEXOL 300 MG/ML SOLN, additional oral enteric contrast COMPARISON:  CT chest abdomen pelvis, 08/20/2019, MR abdomen, 10/21/2019 FINDINGS: CT CHEST FINDINGS Cardiovascular: Right chest port catheter. Aortic atherosclerosis. Aortic valve calcifications normal heart size. Three-vessel coronary artery calcifications no pericardial effusion. Mediastinum/Nodes: No enlarged mediastinal, hilar, or axillary lymph nodes. No change in AP window soft tissue at the site of a previously PET avid lymph node (series 2, image 23). Thyroid gland, trachea, and esophagus demonstrate no significant findings. Lungs/Pleura: Diffuse bilateral bronchial  wall thickening. Frothy debris in the right bronchus intermedius (series 3, image 77). Innumerable tiny centrilobular pulmonary nodules. Interval increase in dense consolidation, architectural distortion, and fibrosis of the suprahilar left upper lobe (series 3, image 54). No pleural effusion or pneumothorax. Musculoskeletal: No chest wall mass or suspicious bone lesions identified. CT ABDOMEN PELVIS FINDINGS Hepatobiliary: Hypodense lesion in the lateral liver dome, hepatic segment VII, measuring 1.3 x 1.0 cm (series 2, image 51). This is unchanged compared to prior CT, although smaller than the lesion on prior MR dated 10/21/2019. No gallstones, gallbladder wall thickening, or biliary dilatation. Pancreas: Unremarkable. No pancreatic ductal dilatation or surrounding inflammatory changes. Spleen: Normal in size without significant abnormality. Adrenals/Urinary Tract: Adrenal glands are unremarkable. Kidneys are normal, without renal calculi, solid lesion, or hydronephrosis. Bladder is unremarkable. Stomach/Bowel: Stomach is within normal limits. Redemonstrated postoperative findings of partial right colectomy and reanastomosis no evidence of bowel wall thickening, distention, or inflammatory changes. Descending and sigmoid diverticulosis. Large burden of stool throughout the colon. Vascular/Lymphatic: Aortic atherosclerosis. No enlarged abdominal or pelvic lymph nodes. Reproductive: No mass or other abnormality. Other: No abdominal wall hernia or abnormality. No abdominopelvic ascites. Musculoskeletal: No acute or significant osseous findings. IMPRESSION: 1. Interval increase in dense consolidation, architectural distortion, and fibrosis of the suprahilar left upper lobe, consistent with expected development of post radiation fibrosis. 2. No change in AP window soft tissue at the site of a previously PET avid lymph node. No evidence of recurrent disease. 3. Hypodense lesion in the lateral liver dome, hepatic  segment VII, measuring 1.3 x 1.0 cm. This is unchanged compared to prior CT, although smaller than the lesion on prior MR dated 10/21/2019, likely reflecting cross modality differences in enhancement characteristics and appearance rather than significant interval change. 4. No evidence of new metastatic disease in the chest, abdomen, or pelvis. 5. Postoperative findings of right hemicolectomy. 6. Diffuse bilateral bronchial wall thickening and innumerable tiny centrilobular pulmonary nodules, likely smoking-related respiratory bronchiolitis. 7. Coronary artery disease.  Aortic  Atherosclerosis (ICD10-I70.0). Electronically Signed   By: Eddie Candle M.D.   On: 02/23/2020 13:23    ASSESSMENT & PLAN:   Cancer of ascending colon ([place]) #Right colon cancer stage IV- liver metastases; adenocarcinoma; Currentlly on FOLFOX + avastin; 02/23/2020- CT-chest and pelvis-overall stable liver lesion; left upper lobe radiation changes [see below].  # proceed with 5FU with Avastin; Discontinue 80% oxaliplatin [see below-severe fatigue] Labs today reviewed;  acceptable for treatment today.  # Extreme fatigue-secondary to oxaliplatin/poorly controlled blood sugars; discontinue Oxaliplatin. .  # Anemia- secondary- sec to chemo-hemoglobin on 11-12-stable  # Back pain- MRI lumbar spine- July 2021-NEG for mets; severe arthritic changes; spinal canal stenosis.on hydrocodone prn [twice a week prn]-stable  # Poorly controlled diabetes blood sugars-better controlled as per patients.; Stable- BG this AM 193  Recommend close monitoring.  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  S/p concurrent radiation x4 cycles. CT scan chest February 23, 2020-LUL radiation changes otherwise stable   # HTN- 130s/ 80-on Avastin. STABLE.   # DISPOSITION: # today 5FU + avastin;DC- oxaliplatin-Leucovorin;  d-3- pump de-access # follow up in 2 weeks-MD; labs-cbc/cmp/cea; UA; 5FU + avastin; D-3 pump de-access;Dr.B    # I reviewed the blood work- with the patient in detail; also reviewed the imaging independently [as summarized above]; and with the patient in detail.    All questions were answered. The patient knows to call the clinic with any problems, questions or concerns.    Cammie Sickle, MD 02/24/2020 9:30 AM

## 2020-02-24 NOTE — Progress Notes (Signed)
No oxal/leuc today, only 5FU and avastin per MD

## 2020-02-24 NOTE — Assessment & Plan Note (Addendum)
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  # proceed with 5FU with Avastin; Discontinue 80% oxaliplatin [see below-severe fatigue] Labs today reviewed;  acceptable for treatment today.  # Extreme fatigue-secondary to oxaliplatin/poorly controlled blood sugars; discontinue Oxaliplatin. .  # Anemia- secondary- sec to chemo-hemoglobin on 11-12-stable  # Back pain- MRI lumbar spine- July 2021-NEG for mets; severe arthritic changes; spinal canal stenosis.on hydrocodone prn [twice a week prn]-stable  # Poorly controlled diabetes blood sugars-better controlled as per patients.; Stable- BG this AM 193  Recommend close monitoring.  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  S/p concurrent radiation x4 cycles. CT scan chest February 23, 2020-LUL radiation changes otherwise stable   # HTN- 130s/ 80-on Avastin. STABLE.   # DISPOSITION: # today 5FU + avastin;DC- oxaliplatin-Leucovorin;  d-3- pump de-access # follow up in 2 weeks-MD; labs-cbc/cmp/cea; UA; 5FU + avastin; D-3 pump de-access;Dr.B   # I reviewed the blood work- with the patient in detail; also reviewed the imaging independently [as summarized above]; and with the patient in detail.

## 2020-02-25 LAB — CEA: CEA: 5.1 ng/mL — ABNORMAL HIGH (ref 0.0–4.7)

## 2020-02-26 ENCOUNTER — Other Ambulatory Visit: Payer: Self-pay

## 2020-02-26 ENCOUNTER — Inpatient Hospital Stay: Payer: BC Managed Care – PPO

## 2020-02-26 DIAGNOSIS — Z5111 Encounter for antineoplastic chemotherapy: Secondary | ICD-10-CM | POA: Diagnosis not present

## 2020-02-26 DIAGNOSIS — Z7189 Other specified counseling: Secondary | ICD-10-CM

## 2020-02-26 DIAGNOSIS — C182 Malignant neoplasm of ascending colon: Secondary | ICD-10-CM

## 2020-02-26 MED ORDER — SODIUM CHLORIDE 0.9% FLUSH
10.0000 mL | INTRAVENOUS | Status: DC | PRN
  Administered 2020-02-26: 10 mL
  Filled 2020-02-26: qty 10

## 2020-02-26 MED ORDER — HEPARIN SOD (PORK) LOCK FLUSH 100 UNIT/ML IV SOLN
INTRAVENOUS | Status: AC
  Filled 2020-02-26: qty 5

## 2020-02-26 MED ORDER — HEPARIN SOD (PORK) LOCK FLUSH 100 UNIT/ML IV SOLN
500.0000 [IU] | Freq: Once | INTRAVENOUS | Status: AC | PRN
  Administered 2020-02-26: 500 [IU]
  Filled 2020-02-26: qty 5

## 2020-03-08 NOTE — Progress Notes (Signed)
Pt expressed left shoulder and upper back pain that is at an 8. Would like to see about increasing his hydrocodone on trying something a bit stronger to help with pain. States he is having to take 2 tablets of hydrocodone to help with pain.

## 2020-03-09 ENCOUNTER — Inpatient Hospital Stay: Payer: BC Managed Care – PPO

## 2020-03-09 ENCOUNTER — Other Ambulatory Visit: Payer: Self-pay

## 2020-03-09 ENCOUNTER — Inpatient Hospital Stay: Payer: BC Managed Care – PPO | Admitting: Internal Medicine

## 2020-03-09 VITALS — BP 152/72 | HR 61

## 2020-03-09 DIAGNOSIS — C182 Malignant neoplasm of ascending colon: Secondary | ICD-10-CM | POA: Diagnosis not present

## 2020-03-09 DIAGNOSIS — Z7189 Other specified counseling: Secondary | ICD-10-CM

## 2020-03-09 DIAGNOSIS — Z5111 Encounter for antineoplastic chemotherapy: Secondary | ICD-10-CM | POA: Diagnosis not present

## 2020-03-09 LAB — CBC WITH DIFFERENTIAL/PLATELET
Abs Immature Granulocytes: 0.02 10*3/uL (ref 0.00–0.07)
Basophils Absolute: 0 10*3/uL (ref 0.0–0.1)
Basophils Relative: 1 %
Eosinophils Absolute: 0.4 10*3/uL (ref 0.0–0.5)
Eosinophils Relative: 5 %
HCT: 33.3 % — ABNORMAL LOW (ref 39.0–52.0)
Hemoglobin: 11.4 g/dL — ABNORMAL LOW (ref 13.0–17.0)
Immature Granulocytes: 0 %
Lymphocytes Relative: 18 %
Lymphs Abs: 1.5 10*3/uL (ref 0.7–4.0)
MCH: 29.2 pg (ref 26.0–34.0)
MCHC: 34.2 g/dL (ref 30.0–36.0)
MCV: 85.4 fL (ref 80.0–100.0)
Monocytes Absolute: 1 10*3/uL (ref 0.1–1.0)
Monocytes Relative: 12 %
Neutro Abs: 5.2 10*3/uL (ref 1.7–7.7)
Neutrophils Relative %: 64 %
Platelets: 126 10*3/uL — ABNORMAL LOW (ref 150–400)
RBC: 3.9 MIL/uL — ABNORMAL LOW (ref 4.22–5.81)
RDW: 18.8 % — ABNORMAL HIGH (ref 11.5–15.5)
WBC: 8 10*3/uL (ref 4.0–10.5)
nRBC: 0 % (ref 0.0–0.2)

## 2020-03-09 LAB — URINALYSIS, COMPLETE (UACMP) WITH MICROSCOPIC
Bacteria, UA: NONE SEEN
Bilirubin Urine: NEGATIVE
Glucose, UA: >=500 mg/dL — AB
Hgb urine dipstick: NEGATIVE
Ketones, ur: NEGATIVE mg/dL
Leukocytes,Ua: NEGATIVE
Nitrite: NEGATIVE
Protein, ur: NEGATIVE mg/dL
Specific Gravity, Urine: 1.008 (ref 1.005–1.030)
pH: 6 (ref 5.0–8.0)

## 2020-03-09 LAB — COMPREHENSIVE METABOLIC PANEL
ALT: 19 U/L (ref 0–44)
AST: 26 U/L (ref 15–41)
Albumin: 3.6 g/dL (ref 3.5–5.0)
Alkaline Phosphatase: 64 U/L (ref 38–126)
Anion gap: 10 (ref 5–15)
BUN: 16 mg/dL (ref 8–23)
CO2: 25 mmol/L (ref 22–32)
Calcium: 8.6 mg/dL — ABNORMAL LOW (ref 8.9–10.3)
Chloride: 97 mmol/L — ABNORMAL LOW (ref 98–111)
Creatinine, Ser: 0.96 mg/dL (ref 0.61–1.24)
GFR calc Af Amer: >60 mL/min (ref >60)
GFR calc non Af Amer: >60 mL/min (ref >60)
Glucose, Bld: 260 mg/dL — ABNORMAL HIGH (ref 70–99)
Potassium: 3.9 mmol/L (ref 3.5–5.1)
Sodium: 132 mmol/L — ABNORMAL LOW (ref 135–145)
Total Bilirubin: 0.6 mg/dL (ref 0.3–1.2)
Total Protein: 6.8 g/dL (ref 6.5–8.1)

## 2020-03-09 MED ORDER — DEXTROSE 5 % IV SOLN
Freq: Once | INTRAVENOUS | Status: DC
  Filled 2020-03-09: qty 250

## 2020-03-09 MED ORDER — SODIUM CHLORIDE 0.9 % IV SOLN
5.0000 mg/kg | Freq: Once | INTRAVENOUS | Status: AC
  Administered 2020-03-09: 300 mg via INTRAVENOUS
  Filled 2020-03-09: qty 12

## 2020-03-09 MED ORDER — HYDROCODONE-ACETAMINOPHEN 5-325 MG PO TABS: ORAL_TABLET | ORAL | 0 refills | Status: DC

## 2020-03-09 MED ORDER — SODIUM CHLORIDE 0.9 % IV SOLN: 4.0000 mg | Freq: Once | INTRAVENOUS | Status: DC

## 2020-03-09 MED ORDER — DEXAMETHASONE SODIUM PHOSPHATE 10 MG/ML IJ SOLN
4.0000 mg | Freq: Once | INTRAMUSCULAR | Status: AC
  Administered 2020-03-09: 4 mg via INTRAVENOUS
  Filled 2020-03-09: qty 1

## 2020-03-09 MED ORDER — SODIUM CHLORIDE 0.9 % IV SOLN
Freq: Once | INTRAVENOUS | Status: AC
  Filled 2020-03-09: qty 250

## 2020-03-09 MED ORDER — SODIUM CHLORIDE 0.9 % IV SOLN
2400.0000 mg/m2 | INTRAVENOUS | Status: DC
  Administered 2020-03-09: 4200 mg via INTRAVENOUS
  Filled 2020-03-09: qty 84

## 2020-03-09 NOTE — Progress Notes (Signed)
[facility] NOTE  Patient Care Team: Laneta Simmers, NP as PCP - General (Nurse Practitioner) Telford Nab, RN as Registered Nurse Clent Jacks, RN as Oncology Nurse Navigator  CHIEF COMPLAINTS/PURPOSE OF CONSULTATION: Lung cancer/colon cancer   Oncology History Overview Note  # July 2020- SMALL CELL CA METASTATIC TO MEDIASTINAL LN [open Bx; Dr.Oaks]; TxN2M0; July 2nd 2020-PET- 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  MRI brain negative  # aug 17th 2020-carbo etoposide-RT [? 8/19]; #4 cycle carbo-Etop [finished dec 30th,2020]  #August 2020 iron deficient anemia-question etiology; IV Feraheme [no colonoscopy]  # OCT 2020- colo/ Cecal adeno ca; MRI liver- 1 cm enhancing lesion "metastasis" [Dr.Anna/Dr.Cannon]-difficulty biopsy.  November 02-2019 right hemicolectomy- STAGE III [pT3pN1 (2/13LN)]- HOLD adjuvant therapy. MMR- INTACT/LOW  #May 2021-liver biopsy-possible adenocarcinoma; colorectal origin.  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 ([place])  01/29/2019 Initial Diagnosis   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 [facility])   02/09/2019 - 07/12/2019 Chemotherapy   The patient had palonosetron (ALOXI) injection 0.25 mg, 0.25 mg, Intravenous,  Once, 4 of 4 cycles Administration: 0.25 mg (02/09/2019), 0.25 mg  (03/03/2019), 0.25 mg (06/02/2019), 0.25 mg (06/22/2019) CARBOplatin (PARAPLATIN) 410 mg in sodium chloride 0.9 % 250 mL chemo infusion, 410 mg (100 % of original dose 414 mg), Intravenous,  Once, 4 of 4 cycles Dose modification:   (original dose 414 mg, Cycle 1) Administration: 410 mg (02/09/2019), 410 mg (03/03/2019), 410 mg (06/02/2019), 410 mg (06/22/2019) etoposide (VEPESID) 180 mg in sodium chloride 0.9 % 500 mL chemo infusion, 100 mg/m2 = 180 mg, Intravenous,  Once, 4 of 4 cycles Administration: 180 mg (02/09/2019), 180 mg (02/10/2019), 180 mg (02/11/2019), 180 mg (03/03/2019), 180 mg (03/04/2019), 180 mg (03/05/2019), 180 mg (06/02/2019), 180 mg (06/03/2019), 180 mg (06/04/2019), 180 mg (06/22/2019), 180 mg (06/23/2019), 180 mg (06/24/2019) fosaprepitant (EMEND) 150 mg, dexamethasone (DECADRON) 6 mg in sodium chloride 0.9 % 145 mL IVPB, , Intravenous,  Once, 2 of 2 cycles Administration:  (06/02/2019),  (06/22/2019)  for chemotherapy treatment.    Cancer of ascending colon ([place])  05/04/2019 Initial Diagnosis   Cancer of ascending colon ([place])   11/30/2019 -  Chemotherapy   The patient had dexamethasone (DECADRON) 4 MG tablet, 8 mg, Oral, Daily, 1 of 1 cycle, Start date: --, End date: -- palonosetron (ALOXI) injection 0.25 mg, 0.25 mg, Intravenous,  Once, 5 of 5 cycles Administration: 0.25 mg (11/30/2019), 0.25 mg (01/13/2020), 0.25 mg (01/27/2020), 0.25 mg (12/14/2019), 0.25 mg (02/10/2020) leucovorin 700 mg in dextrose 5 % 250 mL infusion, 400 mg/m2 = 700 mg, Intravenous,  Once, 6 of 6 cycles Administration: 700 mg (11/30/2019), 700 mg (12/29/2019), 700 mg (01/13/2020), 700 mg (01/27/2020), 700 mg (12/14/2019), 700 mg (02/10/2020) oxaliplatin (ELOXATIN) 150 mg in dextrose 5 % 500 mL chemo infusion, 85 mg/m2 = 150 mg, Intravenous,  Once, 5 of 5 cycles Dose modification: 68 mg/m2 (original dose 85  mg/m2, Cycle 4, Reason: Provider Judgment) Administration: 120 mg (01/13/2020), 120 mg (01/27/2020), 150 mg (12/14/2019), 120 mg  (02/10/2020) fluorouracil (ADRUCIL) 4,200 mg in sodium chloride 0.9 % 66 mL chemo infusion, 2,400 mg/m2 = 4,200 mg, Intravenous, 1 Day/Dose, 8 of 12 cycles Administration: 4,200 mg (11/30/2019), 4,200 mg (12/29/2019), 4,200 mg (01/13/2020), 4,200 mg (01/27/2020), 4,200 mg (12/14/2019), 4,200 mg (02/10/2020), 4,200 mg (02/24/2020) bevacizumab-bvzr (ZIRABEV) 300 mg in sodium chloride 0.9 % 100 mL chemo infusion, 5 mg/kg = 300 mg, Intravenous,  Once, 8 of 12 cycles Administration: 300 mg (12/29/2019), 300 mg (01/13/2020), 300 mg (01/27/2020), 300 mg (12/14/2019), 300 mg (02/10/2020), 300 mg (02/24/2020), 300 mg (03/09/2020)  for chemotherapy treatment.     HISTORY OF PRESENTING ILLNESS:   Cleston Kulish 69 y.o.  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   Since stopping the oxaliplatin last visit notes to have improvement of his tingling and numbness or joint pains.  No nausea no vomiting.  Continues to have back pain.  Needing narcotic pain medication.  Chronic mild shortness of breath.  Review of Systems  Constitutional: Positive for malaise/fatigue and weight loss. Negative for chills, diaphoresis and fever.  HENT: Negative for nosebleeds and sore throat.   Eyes: Negative for double vision.  Respiratory: Positive for shortness of breath. Negative for hemoptysis and wheezing.   Cardiovascular: Positive for leg swelling. Negative for chest pain, palpitations and orthopnea.  Gastrointestinal: Negative for abdominal pain, blood in stool, constipation, diarrhea, heartburn, melena, nausea and vomiting.  Genitourinary: Negative for dysuria, frequency and urgency.  Musculoskeletal: Positive for back pain, joint pain and myalgias.  Skin: Negative.  Negative for itching and rash.  Neurological: Positive for tingling. Negative for dizziness, focal weakness and weakness.   Endo/Heme/Allergies: Does not bruise/bleed easily.  Psychiatric/Behavioral: Negative for depression. The patient is not nervous/anxious and does not have insomnia.      MEDICAL HISTORY:  Past Medical History:  Diagnosis Date  . Asthma   . Cancer ([place])    Metastatic small cell lung cancer  . Cerebral aneurysm   . Chronic painful diabetic neuropathy ([place])   . Depression   . Diabetes mellitus without complication ([place])   . Essential hypertension   . History of kidney stones   . Occasional tremors   . Tobacco use     SURGICAL HISTORY: Past Surgical History:  Procedure Laterality Date  . ABDOMINAL SURGERY     age 2. trauma surgery due to forklift injury- liver and spleen  . COLONOSCOPY WITH PROPOFOL N/A 03/27/2019   Procedure: COLONOSCOPY WITH PROPOFOL;  Surgeon: Jonathon Bellows, MD;  Location: [facility] ENDOSCOPY;  Service: Gastroenterology;  Laterality: N/A;  . COLOSTOMY REVISION Right 05/04/2019   Procedure: COLON RESECTION RIGHT-right hemicolectomy-open;  Surgeon: Fredirick Maudlin, MD;  Location: [facility] ORS;  Service: General;  Laterality: Right;  . ESOPHAGOGASTRODUODENOSCOPY (EGD) WITH PROPOFOL N/A 03/27/2019   Procedure: ESOPHAGOGASTRODUODENOSCOPY (EGD) WITH PROPOFOL;  Surgeon: Jonathon Bellows, MD;  Location: [facility] ENDOSCOPY;  Service: Gastroenterology;  Laterality: N/A;  . IR GENERIC HISTORICAL  05/31/2016   IR ANGIO VERTEBRAL SEL VERTEBRAL BILAT MOD SED 05/31/2016 Consuella Lose, MD [facility]-INTERV RAD  . IR GENERIC HISTORICAL  05/31/2016   IR ANGIO INTRA EXTRACRAN SEL INTERNAL CAROTID BILAT MOD SED 05/31/2016 Consuella Lose, MD [facility]INTER[facility]  . PORTACATH PLACEMENT Right 02/04/2019   Procedure: INSERTION PORT-A-CATH;  Surgeon: [name], MD;  Location: [facility] ORS;  Service: General;  Laterality: Right;  . THORACOTOMY Left 01/22/2019   Procedure: PRE OP BRONCH LEFT ANTERIOR THORACOTOMY WITH BIOPSY OF MEDIASTINAL MASS;  Surgeon: [name], MD;  Location: [facility] ORS;  Service: General;   Laterality: Left;    SOCIAL HISTORY: Social History   Socioeconomic History  . Marital status: Married    Spouse name: Not on file  . Number of children: Not on file  . Years of education: Not on file  . Highest education level: Not on file  Occupational History  . Not on file  Tobacco Use  . Smoking status: Current Every Day Smoker    Packs/day: 0.50    Years: 50.00    Pack years: 25.00    Types: Cigarettes  . Smokeless tobacco: Never Used  Vaping Use  . Vaping Use: Every day  . Substances: Nicotine  Substance and Sexual Activity  . Alcohol use: No  . Drug use: Yes    Types: Barbituates, Marijuana  . Sexual activity: Not on file  Other Topics Concern  . Not on file  Social History Narrative   Lives in [place]; wife/ son/grandson [custody]; smoker; vending business; hx of alcoholism- quit at 63.    Social Determinants of Health   Financial Resource Strain: Low Risk   . Difficulty of Paying Living Expenses: Not hard at all  Food Insecurity: No Food Insecurity  . Worried About Charity fundraiser in the Last Year: Never true  . Ran Out of Food in the Last Year: Never true  Transportation Needs: No Transportation Needs  . Lack of Transportation (Medical): No  . Lack of Transportation (Non-Medical): No  Physical Activity: Inactive  . Days of Exercise per Week: 0 days  . Minutes of Exercise per Session: 0 min  Stress: No Stress Concern Present  . Feeling of Stress : Not at all  Social Connections: Unknown  . Frequency of Communication with Friends and Family: Patient refused  . Frequency of Social Gatherings with Friends and Family: Patient refused  . Attends Religious Services: Patient refused  . Active Member of Clubs or Organizations: Patient refused  . Attends Archivist Meetings: Patient refused  . Marital Status: Patient refused  Intimate Partner Violence: Not At Risk  . Fear of Current or Ex-Partner: No  . Emotionally Abused: No  . Physically  Abused: No  . Sexually Abused: No    FAMILY HISTORY: Family History  Problem Relation Age of Onset  . Lymphoma Father   . Liver cancer Paternal Uncle   . Lung cancer Paternal Uncle   . Lung cancer Maternal Uncle     ALLERGIES:  is allergic to ferumoxytol, amoxicillin, and codeine.  MEDICATIONS:  Current Outpatient Medications  Medication Sig Dispense Refill  . amLODipine (NORVASC) 10 MG tablet Take 10 mg by mouth every morning.     . budesonide-formoterol (SYMBICORT) 160-4.5 MCG/ACT inhaler Inhale 2 puffs into the lungs 2 (two) times daily.    . DULoxetine (CYMBALTA) 30 MG capsule Take 30 mg by mouth every morning.     . ferrous sulfate 324 (65 Fe) MG TBEC Take 324 mg by mouth daily.     Marland Kitchen gabapentin (NEURONTIN) 300 MG capsule Take 600 mg by mouth 3 (three) times daily.     . hydrochlorothiazide (HYDRODIURIL) 25 MG tablet Take 25 mg by mouth daily.     Marland Kitchen HYDROcodone-acetaminophen (NORCO/VICODIN) 5-325 MG tablet Tale one tablet a day as needed for pain. 45 tablet  0  . insulin glargine (LANTUS) 100 UNIT/ML injection Inject into the skin daily. Units based By sliding scale    . Ipratropium-Albuterol (COMBIVENT) 20-100 MCG/ACT AERS respimat Inhale into the lungs.    . lidocaine-prilocaine (EMLA) cream Apply 1 application topically as needed. 30-45 mins prior to port access. 30 g 0  . linaclotide (LINZESS) 145 MCG CAPS capsule Take 145 mcg by mouth daily before breakfast.    . lovastatin (MEVACOR) 20 MG tablet Take 20 mg by mouth every evening.    . metFORMIN (GLUCOPHAGE) 1000 MG tablet Take 1,000 mg by mouth 2 (two) times daily with a meal.     . metFORMIN (GLUCOPHAGE-XR) 500 MG 24 hr tablet Take 1,000 mg by mouth 2 (two) times daily.    Marland Kitchen omeprazole (PRILOSEC) 40 MG capsule Take 1 capsule (40 mg total) by mouth daily. 90 capsule 1  . ondansetron (ZOFRAN) 8 MG tablet Take 1 tablet (8 mg total) by mouth every 8 (eight) hours as needed for nausea or vomiting (start 3 days; after chemo). 40  tablet 1  . polyethylene glycol powder (MIRALAX) 17 GM/SCOOP powder Mix full container in 64 ounces of Gatorade or other clear liquid. NO RED Liquids 238 g 0  . prazosin (MINIPRESS) 1 MG capsule Take 1 mg by mouth at bedtime.    . psyllium (METAMUCIL) 58.6 % powder Take 1 packet by mouth daily.    Marland Kitchen pyridOXINE (VITAMIN B-6) 100 MG tablet Take 100 mg by mouth daily.    . sitaGLIPtin (JANUVIA) 25 MG tablet Take 25 mg by mouth daily.    . vitamin B-12 (CYANOCOBALAMIN) 100 MCG tablet Take 100 mcg by mouth daily.    . Multiple Vitamins-Minerals (MULTIVITAMIN WITH MINERALS) tablet Take 1 tablet by mouth daily.  (Patient not taking: Reported on 03/08/2020)     No current facility-administered medications for this visit.      Marland Kitchen  PHYSICAL EXAMINATION: ECOG PERFORMANCE STATUS: 0 - Asymptomatic  Vitals:   03/09/20 0833  BP: (!) 146/67  Pulse: 66  Resp: 20  Temp: (!) 95 F (35 C)   Filed Weights   03/09/20 0833  Weight: 143 lb (64.9 kg)    Physical Exam HENT:     Head: Normocephalic and atraumatic.     Mouth/Throat:     Pharynx: No oropharyngeal exudate.  Eyes:     Pupils: Pupils are equal, round, and reactive to light.  Cardiovascular:     Rate and Rhythm: Normal rate and regular rhythm.  Pulmonary:     Effort: No respiratory distress.     Breath sounds: No wheezing.  Abdominal:     General: Bowel sounds are normal. There is no distension.     Palpations: Abdomen is soft. There is no mass.     Tenderness: There is no abdominal tenderness. There is no guarding or rebound.     Comments: Abdominal incision well-healed.  Musculoskeletal:        General: No tenderness. Normal range of motion.     Cervical back: Normal range of motion and neck supple.  Skin:    General: Skin is warm.  Neurological:     Mental Status: He is alert and oriented to person, place, and time.  Psychiatric:        Mood and Affect: Affect normal.     LABORATORY DATA:  I have reviewed the data as  listed Lab Results  Component Value Date   WBC 8.0 03/09/2020   HGB 11.4 (L) 03/09/2020  HCT 33.3 (L) 03/09/2020   MCV 85.4 03/09/2020   PLT 126 (L) 03/09/2020   Recent Labs    02/10/20 0900 02/24/20 0819 03/09/20 0813  NA 133* 134* 132*  K 4.5 4.1 3.9  CL 101 98 97*  CO2 _0 GLUCOSE 249* 196* 260*  BUN 16 33* 16  CREATININE 0.96 0.96 0.96  CALCIUM 8.8* 8.6* 8.6*  GFRNONAA >60 >60 >60  GFRAA >60 >60 >60  PROT 7.3 6.8 6.8  ALBUMIN 4.0 3.6 3.6  AST _1 ALT _2 ALKPHOS 66 59 64  BILITOT 0.5 0.3 0.6    RADIOGRAPHIC STUDIES: I have personally reviewed the radiological images as listed and agreed with the findings in the report. CT Chest W Contrast  Result Date: 02/23/2020 CLINICAL DATA:  Small-cell lung cancer, synchronous colon cancer, evaluate treatment response EXAM: CT CHEST, ABDOMEN, AND PELVIS WITH CONTRAST TECHNIQUE: Multidetector CT imaging of the chest, abdomen and pelvis was performed following the standard protocol during bolus administration of intravenous contrast. CONTRAST:  181m OMNIPAQUE IOHEXOL 300 MG/ML SOLN, additional oral enteric contrast COMPARISON:  CT chest abdomen pelvis, 08/20/2019, MR abdomen, 10/21/2019 FINDINGS: CT CHEST FINDINGS Cardiovascular: Right chest port catheter. Aortic atherosclerosis. Aortic valve calcifications normal heart size. Three-vessel coronary artery calcifications no pericardial effusion. Mediastinum/Nodes: No enlarged mediastinal, hilar, or axillary lymph nodes. No change in AP window soft tissue at the site of a previously PET avid lymph node (series 2, image 23). Thyroid gland, trachea, and esophagus demonstrate no significant findings. Lungs/Pleura: Diffuse bilateral bronchial wall thickening. Frothy debris in the right bronchus intermedius (series 3, image 77). Innumerable tiny centrilobular pulmonary nodules. Interval increase in dense consolidation, architectural distortion, and fibrosis of the suprahilar left  upper lobe (series 3, image 54). No pleural effusion or pneumothorax. Musculoskeletal: No chest wall mass or suspicious bone lesions identified. CT ABDOMEN PELVIS FINDINGS Hepatobiliary: Hypodense lesion in the lateral liver dome, hepatic segment VII, measuring 1.3 x 1.0 cm (series 2, image 51). This is unchanged compared to prior CT, although smaller than the lesion on prior MR dated 10/21/2019. No gallstones, gallbladder wall thickening, or biliary dilatation. Pancreas: Unremarkable. No pancreatic ductal dilatation or surrounding inflammatory changes. Spleen: Normal in size without significant abnormality. Adrenals/Urinary Tract: Adrenal glands are unremarkable. Kidneys are normal, without renal calculi, solid lesion, or hydronephrosis. Bladder is unremarkable. Stomach/Bowel: Stomach is within normal limits. Redemonstrated postoperative findings of partial right colectomy and reanastomosis no evidence of bowel wall thickening, distention, or inflammatory changes. Descending and sigmoid diverticulosis. Large burden of stool throughout the colon. Vascular/Lymphatic: Aortic atherosclerosis. No enlarged abdominal or pelvic lymph nodes. Reproductive: No mass or other abnormality. Other: No abdominal wall hernia or abnormality. No abdominopelvic ascites. Musculoskeletal: No acute or significant osseous findings. IMPRESSION: 1. Interval increase in dense consolidation, architectural distortion, and fibrosis of the suprahilar left upper lobe, consistent with expected development of post radiation fibrosis. 2. No change in AP window soft tissue at the site of a previously PET avid lymph node. No evidence of recurrent disease. 3. Hypodense lesion in the lateral liver dome, hepatic segment VII, measuring 1.3 x 1.0 cm. This is unchanged compared to prior CT, although smaller than the lesion on prior MR dated 10/21/2019, likely reflecting cross modality differences in enhancement characteristics and appearance rather than  significant interval change. 4. No evidence of new metastatic disease in the chest, abdomen, or pelvis. 5. Postoperative findings of right hemicolectomy. 6. Diffuse bilateral bronchial wall thickening and innumerable  tiny centrilobular pulmonary nodules, likely smoking-related respiratory bronchiolitis. 7. Coronary artery disease.  Aortic Atherosclerosis (ICD10-I70.0). Electronically Signed   By: Eddie Candle M.D.   On: 02/23/2020 13:23   CT Abdomen Pelvis W Contrast  Result Date: 02/23/2020 CLINICAL DATA:  Small-cell lung cancer, synchronous colon cancer, evaluate treatment response EXAM: CT CHEST, ABDOMEN, AND PELVIS WITH CONTRAST TECHNIQUE: Multidetector CT imaging of the chest, abdomen and pelvis was performed following the standard protocol during bolus administration of intravenous contrast. CONTRAST:  11m OMNIPAQUE IOHEXOL 300 MG/ML SOLN, additional oral enteric contrast COMPARISON:  CT chest abdomen pelvis, 08/20/2019, MR abdomen, 10/21/2019 FINDINGS: CT CHEST FINDINGS Cardiovascular: Right chest port catheter. Aortic atherosclerosis. Aortic valve calcifications normal heart size. Three-vessel coronary artery calcifications no pericardial effusion. Mediastinum/Nodes: No enlarged mediastinal, hilar, or axillary lymph nodes. No change in AP window soft tissue at the site of a previously PET avid lymph node (series 2, image 23). Thyroid gland, trachea, and esophagus demonstrate no significant findings. Lungs/Pleura: Diffuse bilateral bronchial wall thickening. Frothy debris in the right bronchus intermedius (series 3, image 77). Innumerable tiny centrilobular pulmonary nodules. Interval increase in dense consolidation, architectural distortion, and fibrosis of the suprahilar left upper lobe (series 3, image 54). No pleural effusion or pneumothorax. Musculoskeletal: No chest wall mass or suspicious bone lesions identified. CT ABDOMEN PELVIS FINDINGS Hepatobiliary: Hypodense lesion in the lateral liver  dome, hepatic segment VII, measuring 1.3 x 1.0 cm (series 2, image 51). This is unchanged compared to prior CT, although smaller than the lesion on prior MR dated 10/21/2019. No gallstones, gallbladder wall thickening, or biliary dilatation. Pancreas: Unremarkable. No pancreatic ductal dilatation or surrounding inflammatory changes. Spleen: Normal in size without significant abnormality. Adrenals/Urinary Tract: Adrenal glands are unremarkable. Kidneys are normal, without renal calculi, solid lesion, or hydronephrosis. Bladder is unremarkable. Stomach/Bowel: Stomach is within normal limits. Redemonstrated postoperative findings of partial right colectomy and reanastomosis no evidence of bowel wall thickening, distention, or inflammatory changes. Descending and sigmoid diverticulosis. Large burden of stool throughout the colon. Vascular/Lymphatic: Aortic atherosclerosis. No enlarged abdominal or pelvic lymph nodes. Reproductive: No mass or other abnormality. Other: No abdominal wall hernia or abnormality. No abdominopelvic ascites. Musculoskeletal: No acute or significant osseous findings. IMPRESSION: 1. Interval increase in dense consolidation, architectural distortion, and fibrosis of the suprahilar left upper lobe, consistent with expected development of post radiation fibrosis. 2. No change in AP window soft tissue at the site of a previously PET avid lymph node. No evidence of recurrent disease. 3. Hypodense lesion in the lateral liver dome, hepatic segment VII, measuring 1.3 x 1.0 cm. This is unchanged compared to prior CT, although smaller than the lesion on prior MR dated 10/21/2019, likely reflecting cross modality differences in enhancement characteristics and appearance rather than significant interval change. 4. No evidence of new metastatic disease in the chest, abdomen, or pelvis. 5. Postoperative findings of right hemicolectomy. 6. Diffuse bilateral bronchial wall thickening and innumerable tiny  centrilobular pulmonary nodules, likely smoking-related respiratory bronchiolitis. 7. Coronary artery disease.  Aortic Atherosclerosis (ICD10-I70.0). Electronically Signed   By: AEddie CandleM.D.   On: 02/23/2020 13:23    ASSESSMENT & PLAN:   Cancer of ascending colon ([place] #Right colon cancer stage IV- liver metastases; adenocarcinoma; on FOLFOX + avastin; 02/23/2020- CT-chest and pelvis-overall stable liver lesion; left upper lobe radiation changes [see below].  # proceed with 5FU with Avastin;  Labs today reviewed;  acceptable for treatment today.  # Extreme fatigue-secondary to oxaliplatin/poorly controlled blood sugars- slight improved after  stopping Oxaliplatin.   # Anemia- secondary- sec to chemo-hemoglobin on 11-12-STABLE.   # Back pain- MRI lumbar spine- July 2021-NEG for mets; severe arthritic changes; spinal canal stenosis- worse; hydrocodone 1 day prn. New scipt given.   # Poorly controlled diabetes blood sugars-better controlled as per patients.; over all stable;  BG this AM 260 Recommend close monitoring.  # LEFT hilar/LUL-  Limited stage small cell cancer-s/p chemo-RT; CT scan chest February 23, 2020-LUL radiation changes- STABLE  # HTN- 130s/ 80-on Avastin. STABLE.   # DISPOSITION: # today 5FU + avastin;;  d-3- pump de-access # follow up in 2 weeks-MD; labs-cbc/cmp/cea; UA; 5FU + avastin; D-3 pump de-access;Dr.B     All questions were answered. The patient knows to call the clinic with any problems, questions or concerns.    Cammie Sickle, MD 03/10/2020 1:26 PM

## 2020-03-09 NOTE — Assessment & Plan Note (Addendum)
#  Right colon cancer stage IV- liver metastases; adenocarcinoma; on FOLFOX + avastin; 02/23/2020- CT-chest and pelvis-overall stable liver lesion; left upper lobe radiation changes [see below].  # proceed with 5FU with Avastin;  Labs today reviewed;  acceptable for treatment today.  # Extreme fatigue-secondary to oxaliplatin/poorly controlled blood sugars- slight improved after stopping Oxaliplatin.   # Anemia- secondary- sec to chemo-hemoglobin on 11-12-STABLE.   # Back pain- MRI lumbar spine- July 2021-NEG for mets; severe arthritic changes; spinal canal stenosis- worse; hydrocodone 1 day prn. New scipt given.   # Poorly controlled diabetes blood sugars-better controlled as per patients.; over all stable;  BG this AM 260 Recommend close monitoring.  # LEFT hilar/LUL-  Limited stage small cell cancer-s/p chemo-RT; CT scan chest February 23, 2020-LUL radiation changes- STABLE  # HTN- 130s/ 80-on Avastin. STABLE.   # DISPOSITION: # today 5FU + avastin;;  d-3- pump de-access # follow up in 2 weeks-MD; labs-cbc/cmp/cea; UA; 5FU + avastin; D-3 pump de-access;Dr.B

## 2020-03-10 LAB — CEA: CEA: 4.9 ng/mL — ABNORMAL HIGH (ref 0.0–4.7)

## 2020-03-11 ENCOUNTER — Other Ambulatory Visit: Payer: Self-pay

## 2020-03-11 ENCOUNTER — Inpatient Hospital Stay: Payer: BC Managed Care – PPO

## 2020-03-11 DIAGNOSIS — C182 Malignant neoplasm of ascending colon: Secondary | ICD-10-CM

## 2020-03-11 DIAGNOSIS — Z5111 Encounter for antineoplastic chemotherapy: Secondary | ICD-10-CM | POA: Diagnosis not present

## 2020-03-11 DIAGNOSIS — Z7189 Other specified counseling: Secondary | ICD-10-CM

## 2020-03-11 MED ORDER — SODIUM CHLORIDE 0.9% FLUSH
10.0000 mL | INTRAVENOUS | Status: DC | PRN
  Administered 2020-03-11: 10 mL
  Filled 2020-03-11: qty 10

## 2020-03-11 MED ORDER — HEPARIN SOD (PORK) LOCK FLUSH 100 UNIT/ML IV SOLN
INTRAVENOUS | Status: AC
Start: 2020-03-11 — End: ?
  Filled 2020-03-11: qty 5

## 2020-03-11 MED ORDER — HEPARIN SOD (PORK) LOCK FLUSH 100 UNIT/ML IV SOLN
500.0000 [IU] | Freq: Once | INTRAVENOUS | Status: AC | PRN
  Administered 2020-03-11: 500 [IU]
  Filled 2020-03-11: qty 5

## 2020-03-23 ENCOUNTER — Inpatient Hospital Stay: Payer: BC Managed Care – PPO

## 2020-03-23 ENCOUNTER — Encounter: Payer: Self-pay | Admitting: Internal Medicine

## 2020-03-23 ENCOUNTER — Other Ambulatory Visit: Payer: Self-pay

## 2020-03-23 ENCOUNTER — Inpatient Hospital Stay: Payer: BC Managed Care – PPO | Admitting: Internal Medicine

## 2020-03-23 DIAGNOSIS — C182 Malignant neoplasm of ascending colon: Secondary | ICD-10-CM

## 2020-03-23 DIAGNOSIS — Z5111 Encounter for antineoplastic chemotherapy: Secondary | ICD-10-CM | POA: Diagnosis not present

## 2020-03-23 DIAGNOSIS — Z7189 Other specified counseling: Secondary | ICD-10-CM

## 2020-03-23 LAB — CBC WITH DIFFERENTIAL/PLATELET
Abs Immature Granulocytes: 0.03 10*3/uL (ref 0.00–0.07)
Basophils Absolute: 0 10*3/uL (ref 0.0–0.1)
Basophils Relative: 0 %
Eosinophils Absolute: 0.3 10*3/uL (ref 0.0–0.5)
Eosinophils Relative: 4 %
HCT: 33.8 % — ABNORMAL LOW (ref 39.0–52.0)
Hemoglobin: 11.8 g/dL — ABNORMAL LOW (ref 13.0–17.0)
Immature Granulocytes: 0 %
Lymphocytes Relative: 18 %
Lymphs Abs: 1.6 10*3/uL (ref 0.7–4.0)
MCH: 29.6 pg (ref 26.0–34.0)
MCHC: 34.9 g/dL (ref 30.0–36.0)
MCV: 84.7 fL (ref 80.0–100.0)
Monocytes Absolute: 0.7 10*3/uL (ref 0.1–1.0)
Monocytes Relative: 8 %
Neutro Abs: 6.4 10*3/uL (ref 1.7–7.7)
Neutrophils Relative %: 70 %
Platelets: 128 10*3/uL — ABNORMAL LOW (ref 150–400)
RBC: 3.99 MIL/uL — ABNORMAL LOW (ref 4.22–5.81)
RDW: 17.6 % — ABNORMAL HIGH (ref 11.5–15.5)
WBC: 9.1 10*3/uL (ref 4.0–10.5)
nRBC: 0 % (ref 0.0–0.2)

## 2020-03-23 LAB — COMPREHENSIVE METABOLIC PANEL
ALT: 18 U/L (ref 0–44)
AST: 20 U/L (ref 15–41)
Albumin: 3.8 g/dL (ref 3.5–5.0)
Alkaline Phosphatase: 59 U/L (ref 38–126)
Anion gap: 11 (ref 5–15)
BUN: 17 mg/dL (ref 8–23)
CO2: 25 mmol/L (ref 22–32)
Calcium: 9.1 mg/dL (ref 8.9–10.3)
Chloride: 100 mmol/L (ref 98–111)
Creatinine, Ser: 0.97 mg/dL (ref 0.61–1.24)
GFR calc Af Amer: >60 mL/min (ref >60)
GFR calc non Af Amer: >60 mL/min (ref >60)
Glucose, Bld: 170 mg/dL — ABNORMAL HIGH (ref 70–99)
Potassium: 3.6 mmol/L (ref 3.5–5.1)
Sodium: 136 mmol/L (ref 135–145)
Total Bilirubin: 0.6 mg/dL (ref 0.3–1.2)
Total Protein: 6.9 g/dL (ref 6.5–8.1)

## 2020-03-23 LAB — URINALYSIS, COMPLETE (UACMP) WITH MICROSCOPIC
Bacteria, UA: NONE SEEN
Bilirubin Urine: NEGATIVE
Glucose, UA: 50 mg/dL — AB
Hgb urine dipstick: NEGATIVE
Ketones, ur: NEGATIVE mg/dL
Leukocytes,Ua: NEGATIVE
Nitrite: NEGATIVE
Protein, ur: NEGATIVE mg/dL
Specific Gravity, Urine: 1.005 (ref 1.005–1.030)
pH: 6 (ref 5.0–8.0)

## 2020-03-23 MED ORDER — SODIUM CHLORIDE 0.9 % IV SOLN
4.0000 mg | Freq: Once | INTRAVENOUS | Status: DC
  Filled 2020-03-23: qty 0.4

## 2020-03-23 MED ORDER — SODIUM CHLORIDE 0.9 % IV SOLN
5.0000 mg/kg | Freq: Once | INTRAVENOUS | Status: AC
  Administered 2020-03-23: 300 mg via INTRAVENOUS
  Filled 2020-03-23: qty 12

## 2020-03-23 MED ORDER — DEXAMETHASONE SODIUM PHOSPHATE 10 MG/ML IJ SOLN
4.0000 mg | Freq: Once | INTRAMUSCULAR | Status: AC
  Administered 2020-03-23: 4 mg via INTRAVENOUS
  Filled 2020-03-23: qty 1

## 2020-03-23 MED ORDER — SODIUM CHLORIDE 0.9 % IV SOLN
2400.0000 mg/m2 | INTRAVENOUS | Status: DC
  Administered 2020-03-23: 4200 mg via INTRAVENOUS
  Filled 2020-03-23: qty 84

## 2020-03-23 MED ORDER — SODIUM CHLORIDE 0.9% FLUSH
10.0000 mL | INTRAVENOUS | Status: DC | PRN
  Administered 2020-03-23 (×2): 10 mL via INTRAVENOUS
  Filled 2020-03-23: qty 10

## 2020-03-23 MED ORDER — SODIUM CHLORIDE 0.9 % IV SOLN
Freq: Once | INTRAVENOUS | Status: AC
  Filled 2020-03-23: qty 250

## 2020-03-23 NOTE — Progress Notes (Signed)
[facility] NOTE  Patient Care Team: Laneta Simmers, NP as PCP - General (Nurse Practitioner) Telford Nab, RN as Registered Nurse Clent Jacks, RN as Oncology Nurse Navigator  CHIEF COMPLAINTS/PURPOSE OF CONSULTATION: Lung cancer/colon cancer   Oncology History Overview Note  # July 2020- SMALL CELL CA METASTATIC TO MEDIASTINAL LN [open Bx; Dr.Oaks]; TxN2M0; July 2nd 2020-PET- 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  MRI brain negative  # aug 17th 2020-carbo etoposide-RT [? 8/19]; #4 cycle carbo-Etop [finished dec 30th,2020]  #August 2020 iron deficient anemia-question etiology; IV Feraheme [no colonoscopy]  # OCT 2020- colo/ Cecal adeno ca; MRI liver- 1 cm enhancing lesion "metastasis" [Dr.Anna/Dr.Cannon]-difficulty biopsy.  November 02-2019 right hemicolectomy- STAGE III [pT3pN1 (2/13LN)]- HOLD adjuvant therapy. MMR- INTACT/LOW  #May 2021-liver biopsy-possible adenocarcinoma; colorectal origin.  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 ([place])  01/29/2019 Initial Diagnosis   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 [facility])   02/09/2019 - 07/12/2019 Chemotherapy   The patient had palonosetron (ALOXI) injection 0.25 mg, 0.25 mg, Intravenous,  Once, 4 of 4 cycles Administration: 0.25 mg (02/09/2019), 0.25 mg  (03/03/2019), 0.25 mg (06/02/2019), 0.25 mg (06/22/2019) CARBOplatin (PARAPLATIN) 410 mg in sodium chloride 0.9 % 250 mL chemo infusion, 410 mg (100 % of original dose 414 mg), Intravenous,  Once, 4 of 4 cycles Dose modification:   (original dose 414 mg, Cycle 1) Administration: 410 mg (02/09/2019), 410 mg (03/03/2019), 410 mg (06/02/2019), 410 mg (06/22/2019) etoposide (VEPESID) 180 mg in sodium chloride 0.9 % 500 mL chemo infusion, 100 mg/m2 = 180 mg, Intravenous,  Once, 4 of 4 cycles Administration: 180 mg (02/09/2019), 180 mg (02/10/2019), 180 mg (02/11/2019), 180 mg (03/03/2019), 180 mg (03/04/2019), 180 mg (03/05/2019), 180 mg (06/02/2019), 180 mg (06/03/2019), 180 mg (06/04/2019), 180 mg (06/22/2019), 180 mg (06/23/2019), 180 mg (06/24/2019) fosaprepitant (EMEND) 150 mg, dexamethasone (DECADRON) 6 mg in sodium chloride 0.9 % 145 mL IVPB, , Intravenous,  Once, 2 of 2 cycles Administration:  (06/02/2019),  (06/22/2019)  for chemotherapy treatment.    Cancer of ascending colon ([place])  05/04/2019 Initial Diagnosis   Cancer of ascending colon ([place])   11/30/2019 -  Chemotherapy   The patient had dexamethasone (DECADRON) 4 MG tablet, 8 mg, Oral, Daily, 1 of 1 cycle, Start date: --, End date: -- palonosetron (ALOXI) injection 0.25 mg, 0.25 mg, Intravenous,  Once, 5 of 5 cycles Administration: 0.25 mg (11/30/2019), 0.25 mg (01/13/2020), 0.25 mg (01/27/2020), 0.25 mg (12/14/2019), 0.25 mg (02/10/2020) leucovorin 700 mg in dextrose 5 % 250 mL infusion, 400 mg/m2 = 700 mg, Intravenous,  Once, 6 of 6 cycles Administration: 700 mg (11/30/2019), 700 mg (12/29/2019), 700 mg (01/13/2020), 700 mg (01/27/2020), 700 mg (12/14/2019), 700 mg (02/10/2020) oxaliplatin (ELOXATIN) 150 mg in dextrose 5 % 500 mL chemo infusion, 85 mg/m2 = 150 mg, Intravenous,  Once, 5 of 5 cycles Dose modification: 68 mg/m2 (original dose 85  mg/m2, Cycle 4, Reason: Provider Judgment) Administration: 120 mg (01/13/2020), 120 mg (01/27/2020), 150 mg (12/14/2019), 120 mg  (02/10/2020) fluorouracil (ADRUCIL) 4,200 mg in sodium chloride 0.9 % 66 mL chemo infusion, 2,400 mg/m2 = 4,200 mg, Intravenous, 1 Day/Dose, 8 of 12 cycles Administration: 4,200 mg (11/30/2019), 4,200 mg (12/29/2019), 4,200 mg (01/13/2020), 4,200 mg (01/27/2020), 4,200 mg (12/14/2019), 4,200 mg (02/10/2020), 4,200 mg (02/24/2020), 4,200 mg (03/09/2020) bevacizumab-bvzr (ZIRABEV) 300 mg in sodium chloride 0.9 % 100 mL chemo infusion, 5 mg/kg = 300 mg, Intravenous,  Once, 8 of 12 cycles Administration: 300 mg (12/29/2019), 300 mg (01/13/2020), 300 mg (01/27/2020), 300 mg (12/14/2019), 300 mg (02/10/2020), 300 mg (02/24/2020), 300 mg (03/09/2020)  for chemotherapy treatment.     HISTORY OF PRESENTING ILLNESS:   Kevin Mckay 69 y.o.  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   Patient states his back pain is stable.  He has been taking hydrocodone only as needed.  Fatigue is improved.  Blood sugars better controlled.   Appetite is fair.  No nausea no vomiting.  Chronic mild shortness of breath.  Review of Systems  Constitutional: Positive for malaise/fatigue and weight loss. Negative for chills, diaphoresis and fever.  HENT: Negative for nosebleeds and sore throat.   Eyes: Negative for double vision.  Respiratory: Positive for shortness of breath. Negative for hemoptysis and wheezing.   Cardiovascular: Negative for chest pain, palpitations and orthopnea.  Gastrointestinal: Negative for abdominal pain, blood in stool, constipation, diarrhea, heartburn, melena, nausea and vomiting.  Genitourinary: Negative for dysuria, frequency and urgency.  Musculoskeletal: Positive for back pain and joint pain.  Skin: Negative.  Negative for itching and rash.  Neurological: Positive for tingling. Negative for dizziness, focal weakness and weakness.  Endo/Heme/Allergies: Does  not bruise/bleed easily.  Psychiatric/Behavioral: Negative for depression. The patient is not nervous/anxious and does not have insomnia.      MEDICAL HISTORY:  Past Medical History:  Diagnosis Date  . Asthma   . Cancer ([place])    Metastatic small cell lung cancer  . Cerebral aneurysm   . Chronic painful diabetic neuropathy ([place])   . Depression   . Diabetes mellitus without complication ([place])   . Essential hypertension   . History of kidney stones   . Occasional tremors   . Tobacco use     SURGICAL HISTORY: Past Surgical History:  Procedure Laterality Date  . ABDOMINAL SURGERY     age 73. trauma surgery due to forklift injury- liver and spleen  . COLONOSCOPY WITH PROPOFOL N/A 03/27/2019   Procedure: COLONOSCOPY WITH PROPOFOL;  Surgeon: Jonathon Bellows, MD;  Location: [facility] ENDOSCOPY;  Service: Gastroenterology;  Laterality: N/A;  . COLOSTOMY REVISION Right 05/04/2019   Procedure: COLON RESECTION RIGHT-right hemicolectomy-open;  Surgeon: Fredirick Maudlin, MD;  Location: [facility] ORS;  Service: General;  Laterality: Right;  . ESOPHAGOGASTRODUODENOSCOPY (EGD) WITH PROPOFOL N/A 03/27/2019   Procedure: ESOPHAGOGASTRODUODENOSCOPY (EGD) WITH PROPOFOL;  Surgeon: Jonathon Bellows, MD;  Location: [facility] ENDOSCOPY;  Service: Gastroenterology;  Laterality: N/A;  . IR GENERIC HISTORICAL  05/31/2016   IR ANGIO VERTEBRAL SEL VERTEBRAL BILAT MOD SED 05/31/2016 Consuella Lose, MD [facility]-INTERV RAD  . IR GENERIC HISTORICAL  05/31/2016   IR ANGIO INTRA EXTRACRAN SEL INTERNAL CAROTID BILAT MOD SED 05/31/2016 Consuella Lose, MD [facility]INTER[facility]  . PORTACATH PLACEMENT Right 02/04/2019   Procedure: INSERTION PORT-A-CATH;  Surgeon: [name], MD;  Location:  [facility] ORS;  Service: General;  Laterality: Right;  . THORACOTOMY Left 01/22/2019   Procedure: PRE OP BRONCH LEFT ANTERIOR THORACOTOMY WITH BIOPSY OF MEDIASTINAL MASS;  Surgeon: [name], MD;  Location: [facility] ORS;  Service: General;  Laterality: Left;    SOCIAL  HISTORY: Social History   Socioeconomic History  . Marital status: Married    Spouse name: Not on file  . Number of children: Not on file  . Years of education: Not on file  . Highest education level: Not on file  Occupational History  . Not on file  Tobacco Use  . Smoking status: Current Every Day Smoker    Packs/day: 0.50    Years: 50.00    Pack years: 25.00    Types: Cigarettes  . Smokeless tobacco: Never Used  Vaping Use  . Vaping Use: Every day  . Substances: Nicotine  Substance and Sexual Activity  . Alcohol use: No  . Drug use: Yes    Types: Barbituates, Marijuana  . Sexual activity: Not on file  Other Topics Concern  . Not on file  Social History Narrative   Lives in [place]; wife/ son/grandson [custody]; smoker; vending business; hx of alcoholism- quit at 8.    Social Determinants of Health   Financial Resource Strain: Low Risk   . Difficulty of Paying Living Expenses: Not hard at all  Food Insecurity: No Food Insecurity  . Worried About Charity fundraiser in the Last Year: Never true  . Ran Out of Food in the Last Year: Never true  Transportation Needs: No Transportation Needs  . Lack of Transportation (Medical): No  . Lack of Transportation (Non-Medical): No  Physical Activity: Inactive  . Days of Exercise per Week: 0 days  . Minutes of Exercise per Session: 0 min  Stress: No Stress Concern Present  . Feeling of Stress : Not at all  Social Connections: Unknown  . Frequency of Communication with Friends and Family: Patient refused  . Frequency of Social Gatherings with Friends and Family: Patient refused  . Attends Religious Services: Patient refused  . Active Member of Clubs or Organizations: Patient refused  . Attends Archivist Meetings: Patient refused  . Marital Status: Patient refused  Intimate Partner Violence: Not At Risk  . Fear of Current or Ex-Partner: No  . Emotionally Abused: No  . Physically Abused: No  . Sexually Abused:  No    FAMILY HISTORY: Family History  Problem Relation Age of Onset  . Lymphoma Father   . Liver cancer Paternal Uncle   . Lung cancer Paternal Uncle   . Lung cancer Maternal Uncle     ALLERGIES:  is allergic to ferumoxytol, amoxicillin, and codeine.  MEDICATIONS:  Current Outpatient Medications  Medication Sig Dispense Refill  . amLODipine (NORVASC) 10 MG tablet Take 10 mg by mouth every morning.     . budesonide-formoterol (SYMBICORT) 160-4.5 MCG/ACT inhaler Inhale 2 puffs into the lungs 2 (two) times daily.    . DULoxetine (CYMBALTA) 30 MG capsule Take 30 mg by mouth every morning.     . ferrous sulfate 324 (65 Fe) MG TBEC Take 324 mg by mouth daily.     Marland Kitchen gabapentin (NEURONTIN) 300 MG capsule Take 600 mg by mouth 3 (three) times daily.     . hydrochlorothiazide (HYDRODIURIL) 25 MG tablet Take 25 mg by mouth daily.     Marland Kitchen HYDROcodone-acetaminophen (NORCO/VICODIN) 5-325 MG tablet Tale one tablet a day as needed for pain. 45 tablet 0  .  insulin glargine (LANTUS) 100 UNIT/ML injection Inject into the skin daily. Units based By sliding scale    . Ipratropium-Albuterol (COMBIVENT) 20-100 MCG/ACT AERS respimat Inhale into the lungs.    . lidocaine-prilocaine (EMLA) cream Apply 1 application topically as needed. 30-45 mins prior to port access. 30 g 0  . linaclotide (LINZESS) 145 MCG CAPS capsule Take 145 mcg by mouth daily before breakfast.    . lovastatin (MEVACOR) 20 MG tablet Take 20 mg by mouth every evening.    . metFORMIN (GLUCOPHAGE) 1000 MG tablet Take 1,000 mg by mouth 2 (two) times daily with a meal.     . metFORMIN (GLUCOPHAGE-XR) 500 MG 24 hr tablet Take 1,000 mg by mouth 2 (two) times daily.    Marland Kitchen omeprazole (PRILOSEC) 40 MG capsule Take 1 capsule (40 mg total) by mouth daily. 90 capsule 1  . ondansetron (ZOFRAN) 8 MG tablet Take 1 tablet (8 mg total) by mouth every 8 (eight) hours as needed for nausea or vomiting (start 3 days; after chemo). 40 tablet 1  . polyethylene  glycol powder (MIRALAX) 17 GM/SCOOP powder Mix full container in 64 ounces of Gatorade or other clear liquid. NO RED Liquids 238 g 0  . prazosin (MINIPRESS) 1 MG capsule Take 1 mg by mouth at bedtime.    . psyllium (METAMUCIL) 58.6 % powder Take 1 packet by mouth daily.    Marland Kitchen pyridOXINE (VITAMIN B-6) 100 MG tablet Take 100 mg by mouth daily.    . sitaGLIPtin (JANUVIA) 25 MG tablet Take 25 mg by mouth daily.    . vitamin B-12 (CYANOCOBALAMIN) 100 MCG tablet Take 100 mcg by mouth daily.    . Multiple Vitamins-Minerals (MULTIVITAMIN WITH MINERALS) tablet Take 1 tablet by mouth daily.  (Patient not taking: Reported on 03/23/2020)     No current facility-administered medications for this visit.   Facility-Administered Medications Ordered in Other Visits  Medication Dose Route Frequency Provider Last Rate Last Admin  . sodium chloride flush (NS) 0.9 % injection 10 mL  10 mL Intravenous PRN Cammie Sickle, MD   10 mL at 03/23/20 0855      .  PHYSICAL EXAMINATION: ECOG PERFORMANCE STATUS: 0 - Asymptomatic  Vitals:   03/23/20 0914  BP: (!) 158/70  Pulse: 70  Resp: 16  Temp: (!) 95.3 F (35.2 C)  SpO2: 99%   Filed Weights   03/23/20 0914  Weight: 141 lb (64 kg)    Physical Exam HENT:     Head: Normocephalic and atraumatic.     Mouth/Throat:     Pharynx: No oropharyngeal exudate.  Eyes:     Pupils: Pupils are equal, round, and reactive to light.  Cardiovascular:     Rate and Rhythm: Normal rate and regular rhythm.  Pulmonary:     Effort: No respiratory distress.     Breath sounds: No wheezing.  Abdominal:     General: Bowel sounds are normal. There is no distension.     Palpations: Abdomen is soft. There is no mass.     Tenderness: There is no abdominal tenderness. There is no guarding or rebound.     Comments: Abdominal incision well-healed.  Musculoskeletal:        General: No tenderness. Normal range of motion.     Cervical back: Normal range of motion and neck  supple.  Skin:    General: Skin is warm.  Neurological:     Mental Status: He is alert and oriented to person, place, and time.  Psychiatric:        Mood and Affect: Affect normal.     LABORATORY DATA:  I have reviewed the data as listed Lab Results  Component Value Date   WBC 9.1 03/23/2020   HGB 11.8 (L) 03/23/2020   HCT 33.8 (L) 03/23/2020   MCV 84.7 03/23/2020   PLT 128 (L) 03/23/2020   Recent Labs    02/24/20 0819 03/09/20 0813 03/23/20 0850  NA 134* 132* 136  K 4.1 3.9 3.6  CL 98 97* 100  CO2 $Re'26 25 25  'dkF$ GLUCOSE 196* 260* 170*  BUN 33* 16 17  CREATININE 0.96 0.96 0.97  CALCIUM 8.6* 8.6* 9.1  GFRNONAA >60 >60 >60  GFRAA >60 >60 >60  PROT 6.8 6.8 6.9  ALBUMIN 3.6 3.6 3.8  AST $Re'20 26 20  'Jye$ ALT $R'17 19 18  'NY$ ALKPHOS 59 64 59  BILITOT 0.3 0.6 0.6    RADIOGRAPHIC STUDIES: I have personally reviewed the radiological images as listed and agreed with the findings in the report. CT Chest W Contrast  Result Date: 02/23/2020 CLINICAL DATA:  Small-cell lung cancer, synchronous colon cancer, evaluate treatment response EXAM: CT CHEST, ABDOMEN, AND PELVIS WITH CONTRAST TECHNIQUE: Multidetector CT imaging of the chest, abdomen and pelvis was performed following the standard protocol during bolus administration of intravenous contrast. CONTRAST:  148mL OMNIPAQUE IOHEXOL 300 MG/ML SOLN, additional oral enteric contrast COMPARISON:  CT chest abdomen pelvis, 08/20/2019, MR abdomen, 10/21/2019 FINDINGS: CT CHEST FINDINGS Cardiovascular: Right chest port catheter. Aortic atherosclerosis. Aortic valve calcifications normal heart size. Three-vessel coronary artery calcifications no pericardial effusion. Mediastinum/Nodes: No enlarged mediastinal, hilar, or axillary lymph nodes. No change in AP window soft tissue at the site of a previously PET avid lymph node (series 2, image 23). Thyroid gland, trachea, and esophagus demonstrate no significant findings. Lungs/Pleura: Diffuse bilateral bronchial  wall thickening. Frothy debris in the right bronchus intermedius (series 3, image 77). Innumerable tiny centrilobular pulmonary nodules. Interval increase in dense consolidation, architectural distortion, and fibrosis of the suprahilar left upper lobe (series 3, image 54). No pleural effusion or pneumothorax. Musculoskeletal: No chest wall mass or suspicious bone lesions identified. CT ABDOMEN PELVIS FINDINGS Hepatobiliary: Hypodense lesion in the lateral liver dome, hepatic segment VII, measuring 1.3 x 1.0 cm (series 2, image 51). This is unchanged compared to prior CT, although smaller than the lesion on prior MR dated 10/21/2019. No gallstones, gallbladder wall thickening, or biliary dilatation. Pancreas: Unremarkable. No pancreatic ductal dilatation or surrounding inflammatory changes. Spleen: Normal in size without significant abnormality. Adrenals/Urinary Tract: Adrenal glands are unremarkable. Kidneys are normal, without renal calculi, solid lesion, or hydronephrosis. Bladder is unremarkable. Stomach/Bowel: Stomach is within normal limits. Redemonstrated postoperative findings of partial right colectomy and reanastomosis no evidence of bowel wall thickening, distention, or inflammatory changes. Descending and sigmoid diverticulosis. Large burden of stool throughout the colon. Vascular/Lymphatic: Aortic atherosclerosis. No enlarged abdominal or pelvic lymph nodes. Reproductive: No mass or other abnormality. Other: No abdominal wall hernia or abnormality. No abdominopelvic ascites. Musculoskeletal: No acute or significant osseous findings. IMPRESSION: 1. Interval increase in dense consolidation, architectural distortion, and fibrosis of the suprahilar left upper lobe, consistent with expected development of post radiation fibrosis. 2. No change in AP window soft tissue at the site of a previously PET avid lymph node. No evidence of recurrent disease. 3. Hypodense lesion in the lateral liver dome, hepatic  segment VII, measuring 1.3 x 1.0 cm. This is unchanged compared to prior CT, although smaller than the lesion on  prior MR dated 10/21/2019, likely reflecting cross modality differences in enhancement characteristics and appearance rather than significant interval change. 4. No evidence of new metastatic disease in the chest, abdomen, or pelvis. 5. Postoperative findings of right hemicolectomy. 6. Diffuse bilateral bronchial wall thickening and innumerable tiny centrilobular pulmonary nodules, likely smoking-related respiratory bronchiolitis. 7. Coronary artery disease.  Aortic Atherosclerosis (ICD10-I70.0). Electronically Signed   By: Eddie Candle M.D.   On: 02/23/2020 13:23   CT Abdomen Pelvis W Contrast  Result Date: 02/23/2020 CLINICAL DATA:  Small-cell lung cancer, synchronous colon cancer, evaluate treatment response EXAM: CT CHEST, ABDOMEN, AND PELVIS WITH CONTRAST TECHNIQUE: Multidetector CT imaging of the chest, abdomen and pelvis was performed following the standard protocol during bolus administration of intravenous contrast. CONTRAST:  14mL OMNIPAQUE IOHEXOL 300 MG/ML SOLN, additional oral enteric contrast COMPARISON:  CT chest abdomen pelvis, 08/20/2019, MR abdomen, 10/21/2019 FINDINGS: CT CHEST FINDINGS Cardiovascular: Right chest port catheter. Aortic atherosclerosis. Aortic valve calcifications normal heart size. Three-vessel coronary artery calcifications no pericardial effusion. Mediastinum/Nodes: No enlarged mediastinal, hilar, or axillary lymph nodes. No change in AP window soft tissue at the site of a previously PET avid lymph node (series 2, image 23). Thyroid gland, trachea, and esophagus demonstrate no significant findings. Lungs/Pleura: Diffuse bilateral bronchial wall thickening. Frothy debris in the right bronchus intermedius (series 3, image 77). Innumerable tiny centrilobular pulmonary nodules. Interval increase in dense consolidation, architectural distortion, and fibrosis of the  suprahilar left upper lobe (series 3, image 54). No pleural effusion or pneumothorax. Musculoskeletal: No chest wall mass or suspicious bone lesions identified. CT ABDOMEN PELVIS FINDINGS Hepatobiliary: Hypodense lesion in the lateral liver dome, hepatic segment VII, measuring 1.3 x 1.0 cm (series 2, image 51). This is unchanged compared to prior CT, although smaller than the lesion on prior MR dated 10/21/2019. No gallstones, gallbladder wall thickening, or biliary dilatation. Pancreas: Unremarkable. No pancreatic ductal dilatation or surrounding inflammatory changes. Spleen: Normal in size without significant abnormality. Adrenals/Urinary Tract: Adrenal glands are unremarkable. Kidneys are normal, without renal calculi, solid lesion, or hydronephrosis. Bladder is unremarkable. Stomach/Bowel: Stomach is within normal limits. Redemonstrated postoperative findings of partial right colectomy and reanastomosis no evidence of bowel wall thickening, distention, or inflammatory changes. Descending and sigmoid diverticulosis. Large burden of stool throughout the colon. Vascular/Lymphatic: Aortic atherosclerosis. No enlarged abdominal or pelvic lymph nodes. Reproductive: No mass or other abnormality. Other: No abdominal wall hernia or abnormality. No abdominopelvic ascites. Musculoskeletal: No acute or significant osseous findings. IMPRESSION: 1. Interval increase in dense consolidation, architectural distortion, and fibrosis of the suprahilar left upper lobe, consistent with expected development of post radiation fibrosis. 2. No change in AP window soft tissue at the site of a previously PET avid lymph node. No evidence of recurrent disease. 3. Hypodense lesion in the lateral liver dome, hepatic segment VII, measuring 1.3 x 1.0 cm. This is unchanged compared to prior CT, although smaller than the lesion on prior MR dated 10/21/2019, likely reflecting cross modality differences in enhancement characteristics and appearance  rather than significant interval change. 4. No evidence of new metastatic disease in the chest, abdomen, or pelvis. 5. Postoperative findings of right hemicolectomy. 6. Diffuse bilateral bronchial wall thickening and innumerable tiny centrilobular pulmonary nodules, likely smoking-related respiratory bronchiolitis. 7. Coronary artery disease.  Aortic Atherosclerosis (ICD10-I70.0). Electronically Signed   By: Eddie Candle M.D.   On: 02/23/2020 13:23    ASSESSMENT & PLAN:   Cancer of ascending colon ([place]) #Right colon cancer stage IV- liver metastases; adenocarcinoma;  on FOLFOX + avastin; 02/23/2020- CT-chest and pelvis-overall stable liver lesion; left upper lobe radiation changes [see below].; STABLE  # proceed with 5FU with Avastin;  Labs today reviewed;  acceptable for treatment today.  # Anemia- secondary- sec to chemo-hemoglobin on 11-12-STABLE.   # Back pain- MRI lumbar spine- July 2021-NEG for mets; severe arthritic changes; spinal canal stenosis- STABLE;  hydrocodone 1 day prn.  # Poorly controlled diabetes blood sugars-better controlled as per patients.; over all stable;  BG this AM 170- stable  # LEFT hilar/LUL-  Limited stage small cell cancer-s/p chemo-RT; CT scan chest February 23, 2020-LUL radiation changes- STABLE  # HTN- 158s/ 70-on Avastin. STABLE.   # DISPOSITION: # today 5FU + avastin;;  d-3- pump de-access # follow up in 2 weeks-MD; labs-cbc/cmp/cea; UA; 5FU + avastin; D-3 pump de-access;Dr.B     All questions were answered. The patient knows to call the clinic with any problems, questions or concerns.    Cammie Sickle, MD 03/23/2020 9:36 AM

## 2020-03-23 NOTE — Assessment & Plan Note (Addendum)
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  # Anemia- secondary- sec to chemo-hemoglobin on 11-12-STABLE.   # Back pain- MRI lumbar spine- July 2021-NEG for mets; severe arthritic changes; spinal canal stenosis- STABLE;  hydrocodone 1 day prn.  # Poorly controlled diabetes blood sugars-better controlled as per patients.; over all stable;  BG this AM 170- stable  # LEFT hilar/LUL-  Limited stage small cell cancer-s/p chemo-RT; CT scan chest February 23, 2020-LUL radiation changes- STABLE  # HTN- 158s/ 70-on Avastin. STABLE.   # DISPOSITION: # today 5FU + avastin;;  d-3- pump de-access # follow up in 2 weeks-MD; labs-cbc/cmp/cea; UA; 5FU + avastin; D-3 pump de-access;Dr.B

## 2020-03-24 LAB — CEA: CEA: 4.9 ng/mL — ABNORMAL HIGH (ref 0.0–4.7)

## 2020-03-25 ENCOUNTER — Other Ambulatory Visit: Payer: Self-pay

## 2020-03-25 ENCOUNTER — Inpatient Hospital Stay: Payer: BC Managed Care – PPO | Attending: Internal Medicine

## 2020-03-25 DIAGNOSIS — F17210 Nicotine dependence, cigarettes, uncomplicated: Secondary | ICD-10-CM | POA: Insufficient documentation

## 2020-03-25 DIAGNOSIS — C787 Secondary malignant neoplasm of liver and intrahepatic bile duct: Secondary | ICD-10-CM | POA: Insufficient documentation

## 2020-03-25 DIAGNOSIS — Z7189 Other specified counseling: Secondary | ICD-10-CM

## 2020-03-25 DIAGNOSIS — Z923 Personal history of irradiation: Secondary | ICD-10-CM | POA: Insufficient documentation

## 2020-03-25 DIAGNOSIS — Z7984 Long term (current) use of oral hypoglycemic drugs: Secondary | ICD-10-CM | POA: Diagnosis not present

## 2020-03-25 DIAGNOSIS — I10 Essential (primary) hypertension: Secondary | ICD-10-CM | POA: Diagnosis not present

## 2020-03-25 DIAGNOSIS — D6481 Anemia due to antineoplastic chemotherapy: Secondary | ICD-10-CM | POA: Diagnosis not present

## 2020-03-25 DIAGNOSIS — C3490 Malignant neoplasm of unspecified part of unspecified bronchus or lung: Secondary | ICD-10-CM | POA: Insufficient documentation

## 2020-03-25 DIAGNOSIS — J449 Chronic obstructive pulmonary disease, unspecified: Secondary | ICD-10-CM | POA: Diagnosis not present

## 2020-03-25 DIAGNOSIS — Z5111 Encounter for antineoplastic chemotherapy: Secondary | ICD-10-CM | POA: Diagnosis present

## 2020-03-25 DIAGNOSIS — G8929 Other chronic pain: Secondary | ICD-10-CM | POA: Insufficient documentation

## 2020-03-25 DIAGNOSIS — Z79899 Other long term (current) drug therapy: Secondary | ICD-10-CM | POA: Insufficient documentation

## 2020-03-25 DIAGNOSIS — M4800 Spinal stenosis, site unspecified: Secondary | ICD-10-CM | POA: Insufficient documentation

## 2020-03-25 DIAGNOSIS — C771 Secondary and unspecified malignant neoplasm of intrathoracic lymph nodes: Secondary | ICD-10-CM | POA: Insufficient documentation

## 2020-03-25 DIAGNOSIS — T451X5S Adverse effect of antineoplastic and immunosuppressive drugs, sequela: Secondary | ICD-10-CM | POA: Insufficient documentation

## 2020-03-25 DIAGNOSIS — E119 Type 2 diabetes mellitus without complications: Secondary | ICD-10-CM | POA: Diagnosis not present

## 2020-03-25 DIAGNOSIS — C182 Malignant neoplasm of ascending colon: Secondary | ICD-10-CM

## 2020-03-25 DIAGNOSIS — F32A Depression, unspecified: Secondary | ICD-10-CM | POA: Diagnosis not present

## 2020-03-25 DIAGNOSIS — Z794 Long term (current) use of insulin: Secondary | ICD-10-CM | POA: Insufficient documentation

## 2020-03-25 MED ORDER — HEPARIN SOD (PORK) LOCK FLUSH 100 UNIT/ML IV SOLN
500.0000 [IU] | Freq: Once | INTRAVENOUS | Status: AC | PRN
  Administered 2020-03-25: 500 [IU]
  Filled 2020-03-25: qty 5

## 2020-03-25 MED ORDER — HEPARIN SOD (PORK) LOCK FLUSH 100 UNIT/ML IV SOLN
INTRAVENOUS | Status: AC
  Filled 2020-03-25: qty 5

## 2020-03-25 MED ORDER — SODIUM CHLORIDE 0.9% FLUSH
10.0000 mL | INTRAVENOUS | Status: DC | PRN
  Administered 2020-03-25: 10 mL
  Filled 2020-03-25: qty 10

## 2020-04-06 ENCOUNTER — Inpatient Hospital Stay: Payer: BC Managed Care – PPO

## 2020-04-06 ENCOUNTER — Encounter: Payer: Self-pay | Admitting: Internal Medicine

## 2020-04-06 ENCOUNTER — Other Ambulatory Visit: Payer: Self-pay

## 2020-04-06 ENCOUNTER — Inpatient Hospital Stay (HOSPITAL_BASED_OUTPATIENT_CLINIC_OR_DEPARTMENT_OTHER): Payer: BC Managed Care – PPO | Admitting: Internal Medicine

## 2020-04-06 DIAGNOSIS — Z7189 Other specified counseling: Secondary | ICD-10-CM

## 2020-04-06 DIAGNOSIS — C182 Malignant neoplasm of ascending colon: Secondary | ICD-10-CM

## 2020-04-06 DIAGNOSIS — Z95828 Presence of other vascular implants and grafts: Secondary | ICD-10-CM

## 2020-04-06 LAB — URINALYSIS, COMPLETE (UACMP) WITH MICROSCOPIC
Bacteria, UA: NONE SEEN
Bilirubin Urine: NEGATIVE
Glucose, UA: NEGATIVE mg/dL
Hgb urine dipstick: NEGATIVE
Ketones, ur: NEGATIVE mg/dL
Leukocytes,Ua: NEGATIVE
Nitrite: NEGATIVE
Protein, ur: NEGATIVE mg/dL
Specific Gravity, Urine: 1.008 (ref 1.005–1.030)
Squamous Epithelial / HPF: NONE SEEN (ref 0–5)
pH: 6 (ref 5.0–8.0)

## 2020-04-06 LAB — COMPREHENSIVE METABOLIC PANEL
ALT: 14 U/L (ref 0–44)
AST: 21 U/L (ref 15–41)
Albumin: 3.9 g/dL (ref 3.5–5.0)
Alkaline Phosphatase: 56 U/L (ref 38–126)
Anion gap: 10 (ref 5–15)
BUN: 19 mg/dL (ref 8–23)
CO2: 25 mmol/L (ref 22–32)
Calcium: 9 mg/dL (ref 8.9–10.3)
Chloride: 100 mmol/L (ref 98–111)
Creatinine, Ser: 1.01 mg/dL (ref 0.61–1.24)
GFR, Estimated: >60 mL/min (ref >60)
Glucose, Bld: 141 mg/dL — ABNORMAL HIGH (ref 70–99)
Potassium: 4.2 mmol/L (ref 3.5–5.1)
Sodium: 135 mmol/L (ref 135–145)
Total Bilirubin: 0.4 mg/dL (ref 0.3–1.2)
Total Protein: 6.9 g/dL (ref 6.5–8.1)

## 2020-04-06 LAB — CBC WITH DIFFERENTIAL/PLATELET
Abs Immature Granulocytes: 0.03 10*3/uL (ref 0.00–0.07)
Basophils Absolute: 0.1 10*3/uL (ref 0.0–0.1)
Basophils Relative: 1 %
Eosinophils Absolute: 0.5 10*3/uL (ref 0.0–0.5)
Eosinophils Relative: 8 %
HCT: 35.6 % — ABNORMAL LOW (ref 39.0–52.0)
Hemoglobin: 12.3 g/dL — ABNORMAL LOW (ref 13.0–17.0)
Immature Granulocytes: 1 %
Lymphocytes Relative: 26 %
Lymphs Abs: 1.7 10*3/uL (ref 0.7–4.0)
MCH: 30.1 pg (ref 26.0–34.0)
MCHC: 34.6 g/dL (ref 30.0–36.0)
MCV: 87 fL (ref 80.0–100.0)
Monocytes Absolute: 0.6 10*3/uL (ref 0.1–1.0)
Monocytes Relative: 10 %
Neutro Abs: 3.5 10*3/uL (ref 1.7–7.7)
Neutrophils Relative %: 54 %
Platelets: 161 10*3/uL (ref 150–400)
RBC: 4.09 MIL/uL — ABNORMAL LOW (ref 4.22–5.81)
RDW: 16.9 % — ABNORMAL HIGH (ref 11.5–15.5)
WBC: 6.4 10*3/uL (ref 4.0–10.5)
nRBC: 0 % (ref 0.0–0.2)

## 2020-04-06 MED ORDER — SODIUM CHLORIDE 0.9 % IV SOLN
Freq: Once | INTRAVENOUS | Status: AC
  Filled 2020-04-06: qty 250

## 2020-04-06 MED ORDER — SODIUM CHLORIDE 0.9% FLUSH
10.0000 mL | Freq: Once | INTRAVENOUS | Status: AC
  Administered 2020-04-06: 10 mL via INTRAVENOUS
  Filled 2020-04-06: qty 10

## 2020-04-06 MED ORDER — SODIUM CHLORIDE 0.9 % IV SOLN
2400.0000 mg/m2 | INTRAVENOUS | Status: DC
  Administered 2020-04-06: 4200 mg via INTRAVENOUS
  Filled 2020-04-06: qty 84

## 2020-04-06 MED ORDER — DEXTROSE 5 % IV SOLN
Freq: Once | INTRAVENOUS | Status: DC
  Filled 2020-04-06: qty 250

## 2020-04-06 MED ORDER — DEXAMETHASONE SODIUM PHOSPHATE 10 MG/ML IJ SOLN
4.0000 mg | Freq: Once | INTRAMUSCULAR | Status: AC
  Administered 2020-04-06: 4 mg via INTRAVENOUS
  Filled 2020-04-06: qty 1

## 2020-04-06 MED ORDER — SODIUM CHLORIDE 0.9 % IV SOLN
5.0000 mg/kg | Freq: Once | INTRAVENOUS | Status: AC
  Administered 2020-04-06: 300 mg via INTRAVENOUS
  Filled 2020-04-06: qty 12

## 2020-04-06 MED ORDER — SODIUM CHLORIDE 0.9% FLUSH
10.0000 mL | INTRAVENOUS | Status: DC | PRN
  Administered 2020-04-06: 10 mL
  Filled 2020-04-06: qty 10

## 2020-04-06 MED ORDER — SODIUM CHLORIDE 0.9 % IV SOLN: 4.0000 mg | Freq: Once | INTRAVENOUS | Status: DC

## 2020-04-06 NOTE — Assessment & Plan Note (Addendum)
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  # Anemia- secondary- sec to chemo-hemoglobin on 11-12-STABLE  # Back pain- MRI lumbar spine- July 2021-NEG for mets; severe arthritic changes; spinal canal stenosis- STABLE;  hydrocodone 1 day prn.  # Poorly controlled diabetes blood sugars-better controlled as per patients.; over all stable;  FBG-110; await from this AM.   # LEFT hilar/LUL-  Limited stage small cell cancer-s/p chemo-RT; CT scan chest February 23, 2020-LUL radiation changes-STABLE  # HTN- 168s/ 70-on Avastin; Elevated; recommend checking BP cuff at home; bring al og at next visit.   # DISPOSITION: # today 5FU + avastin;  d-3- pump de-access # follow up in 2 weeks-MD; labs-cbc/cmp/cea; UA; 5FU + avastin; D-3 pump de-access;Dr.B

## 2020-04-06 NOTE — Patient Instructions (Signed)
#   please get Blood pressure machine; and check on a daily basis. Bring the log to next visit.

## 2020-04-06 NOTE — Progress Notes (Signed)
[facility] NOTE  Patient Care Team: Laneta Simmers, NP as PCP - General (Nurse Practitioner) Telford Nab, RN as Registered Nurse Clent Jacks, RN as Oncology Nurse Navigator  CHIEF COMPLAINTS/PURPOSE OF CONSULTATION: Lung cancer/colon cancer   Oncology History Overview Note  # July 2020- SMALL CELL CA METASTATIC TO MEDIASTINAL LN [open Bx; Dr.Oaks]; TxN2M0; July 2nd 2020-PET- 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  MRI brain negative  # aug 17th 2020-carbo etoposide-RT [? 8/19]; #4 cycle carbo-Etop [finished dec 30th,2020]  #August 2020 iron deficient anemia-question etiology; IV Feraheme [no colonoscopy]  # OCT 2020- colo/ Cecal adeno ca; MRI liver- 1 cm enhancing lesion "metastasis" [Dr.Anna/Dr.Cannon]-difficulty biopsy.  November 02-2019 right hemicolectomy- STAGE III [pT3pN1 (2/13LN)]- HOLD adjuvant therapy. MMR- INTACT/LOW  #May 2021-liver biopsy-possible adenocarcinoma; colorectal origin.  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 ([name])  01/29/2019 Initial Diagnosis   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 [facility])   02/09/2019 - 06/24/2019 Chemotherapy   The patient had palonosetron (ALOXI) injection 0.25 mg, 0.25 mg, Intravenous,  Once, 4 of 4 cycles Administration: 0.25 mg (02/09/2019), 0.25 mg  (03/03/2019), 0.25 mg (06/02/2019), 0.25 mg (06/22/2019) CARBOplatin (PARAPLATIN) 410 mg in sodium chloride 0.9 % 250 mL chemo infusion, 410 mg (100 % of original dose 414 mg), Intravenous,  Once, 4 of 4 cycles Dose modification:   (original dose 414 mg, Cycle 1) Administration: 410 mg (02/09/2019), 410 mg (03/03/2019), 410 mg (06/02/2019), 410 mg (06/22/2019) etoposide (VEPESID) 180 mg in sodium chloride 0.9 % 500 mL chemo infusion, 100 mg/m2 = 180 mg, Intravenous,  Once, 4 of 4 cycles Administration: 180 mg (02/09/2019), 180 mg (02/10/2019), 180 mg (02/11/2019), 180 mg (03/03/2019), 180 mg (03/04/2019), 180 mg (03/05/2019), 180 mg (06/02/2019), 180 mg (06/03/2019), 180 mg (06/04/2019), 180 mg (06/22/2019), 180 mg (06/23/2019), 180 mg (06/24/2019) fosaprepitant (EMEND) 150 mg, dexamethasone (DECADRON) 6 mg in sodium chloride 0.9 % 145 mL IVPB, , Intravenous,  Once, 2 of 2 cycles Administration:  (06/02/2019),  (06/22/2019)  for chemotherapy treatment.    Cancer of ascending colon ([name])  05/04/2019 Initial Diagnosis   Cancer of ascending colon ([name])   11/30/2019 -  Chemotherapy   The patient had dexamethasone (DECADRON) 4 MG tablet, 8 mg, Oral, Daily, 1 of 1 cycle, Start date: --, End date: -- palonosetron (ALOXI) injection 0.25 mg, 0.25 mg, Intravenous,  Once, 5 of 5 cycles Administration: 0.25 mg (11/30/2019), 0.25 mg (01/13/2020), 0.25 mg (01/27/2020), 0.25 mg (12/14/2019), 0.25 mg (02/10/2020) leucovorin 700 mg in dextrose 5 % 250 mL infusion, 400 mg/m2 = 700 mg, Intravenous,  Once, 6 of 6 cycles Administration: 700 mg (11/30/2019), 700 mg (12/29/2019), 700 mg (01/13/2020), 700 mg (01/27/2020), 700 mg (12/14/2019), 700 mg (02/10/2020) oxaliplatin (ELOXATIN) 150 mg in dextrose 5 % 500 mL chemo infusion, 85 mg/m2 = 150 mg, Intravenous,  Once, 5 of 5 cycles Dose modification: 68 mg/m2 (original dose 85  mg/m2, Cycle 4, Reason: Provider Judgment) Administration: 120 mg (01/13/2020), 120 mg (01/27/2020), 150 mg (12/14/2019), 120 mg  (02/10/2020) fluorouracil (ADRUCIL) 4,200 mg in sodium chloride 0.9 % 66 mL chemo infusion, 2,400 mg/m2 = 4,200 mg, Intravenous, 1 Day/Dose, 9 of 12 cycles Administration: 4,200 mg (11/30/2019), 4,200 mg (12/29/2019), 4,200 mg (01/13/2020), 4,200 mg (01/27/2020), 4,200 mg (12/14/2019), 4,200 mg (02/10/2020), 4,200 mg (02/24/2020), 4,200 mg (03/09/2020), 4,200 mg (03/23/2020) bevacizumab-bvzr (ZIRABEV) 300 mg in sodium chloride 0.9 % 100 mL chemo infusion, 5 mg/kg = 300 mg, Intravenous,  Once, 9 of 12 cycles Administration: 300 mg (12/29/2019), 300 mg (01/13/2020), 300 mg (01/27/2020), 300 mg (12/14/2019), 300 mg (02/10/2020), 300 mg (02/24/2020), 300 mg (03/09/2020), 300 mg (03/23/2020)  for chemotherapy treatment.     HISTORY OF PRESENTING ILLNESS:   Kevin Mckay 69 y.o.  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   Patient denies any new shortness of breath or cough.  Denies any worsening diarrhea.  Appetite is fair.  No nausea no vomiting.  States his pain is stable needing to take hydrocodone as needed.   Review of Systems  Constitutional: Positive for malaise/fatigue and weight loss. Negative for chills, diaphoresis and fever.  HENT: Negative for nosebleeds and sore throat.   Eyes: Negative for double vision.  Respiratory: Positive for shortness of breath. Negative for hemoptysis and wheezing.   Cardiovascular: Negative for chest pain, palpitations and orthopnea.  Gastrointestinal: Negative for abdominal pain, blood in stool, constipation, diarrhea, heartburn, melena, nausea and vomiting.  Genitourinary: Negative for dysuria, frequency and urgency.  Musculoskeletal: Positive for back pain and joint pain.  Skin: Negative.  Negative for itching and rash.  Neurological: Positive for tingling. Negative for dizziness, focal weakness and weakness.   Endo/Heme/Allergies: Does not bruise/bleed easily.  Psychiatric/Behavioral: Negative for depression. The patient is not nervous/anxious and does not have insomnia.      MEDICAL HISTORY:  Past Medical History:  Diagnosis Date  . Asthma   . Cancer ([place])    Metastatic small cell lung cancer  . Cerebral aneurysm   . Chronic painful diabetic neuropathy ([place])   . Depression   . Diabetes mellitus without complication ([place])   . Essential hypertension   . History of kidney stones   . Occasional tremors   . Tobacco use     SURGICAL HISTORY: Past Surgical History:  Procedure Laterality Date  . ABDOMINAL SURGERY     age 77. trauma surgery due to forklift injury- liver and spleen  . COLONOSCOPY WITH PROPOFOL N/A 03/27/2019   Procedure: COLONOSCOPY WITH PROPOFOL;  Surgeon: Jonathon Bellows, MD;  Location: [facility] ENDOSCOPY;  Service: Gastroenterology;  Laterality: N/A;  . COLOSTOMY REVISION Right 05/04/2019   Procedure: COLON RESECTION RIGHT-right hemicolectomy-open;  Surgeon: Fredirick Maudlin, MD;  Location: [facility] ORS;  Service: General;  Laterality: Right;  . ESOPHAGOGASTRODUODENOSCOPY (EGD) WITH PROPOFOL N/A 03/27/2019   Procedure: ESOPHAGOGASTRODUODENOSCOPY (EGD) WITH PROPOFOL;  Surgeon: Jonathon Bellows, MD;  Location: [facility] ENDOSCOPY;  Service: Gastroenterology;  Laterality: N/A;  . IR GENERIC HISTORICAL  05/31/2016   IR ANGIO VERTEBRAL SEL VERTEBRAL BILAT MOD SED 05/31/2016 Consuella Lose, MD [facility]-INTERV RAD  . IR GENERIC HISTORICAL  05/31/2016   IR ANGIO INTRA EXTRACRAN SEL INTERNAL CAROTID BILAT MOD SED 05/31/2016 Consuella Lose, MD [facility]INTER[facility] RAD  . PORTACATH PLACEMENT Right 02/04/2019   Procedure: INSERTION PORT-A-CATH;  Surgeon: [name], MD;  Location: [facility] ORS;  Service: General;  Laterality: Right;  . THORACOTOMY Left 01/22/2019   Procedure: PRE OP BRONCH LEFT ANTERIOR THORACOTOMY WITH BIOPSY OF MEDIASTINAL MASS;  Surgeon: [name], MD;  Location: [facility] ORS;  Service: General;   Laterality: Left;    SOCIAL HISTORY: Social History   Socioeconomic History  . Marital status: Married    Spouse name: Not on file  . Number of children: Not on file  . Years of education: Not on file  . Highest education level: Not on file  Occupational History  . Not on file  Tobacco Use  . Smoking status: Current Every Day Smoker    Packs/day: 0.50    Years: 50.00    Pack years: 25.00    Types: Cigarettes  . Smokeless tobacco: Never Used  Vaping Use  . Vaping Use: Every day  . Substances: Nicotine  Substance and Sexual Activity  . Alcohol use: No  . Drug use: Yes    Types: Barbituates, Marijuana  . Sexual activity: Not on file  Other Topics Concern  . Not on file  Social History Narrative   Lives in [place]; wife/ son/grandson [custody]; smoker; vending business; hx of alcoholism- quit at 63.    Social Determinants of Health   Financial Resource Strain: Low Risk   . Difficulty of Paying Living Expenses: Not hard at all  Food Insecurity: No Food Insecurity  . Worried About Charity fundraiser in the Last Year: Never true  . Ran Out of Food in the Last Year: Never true  Transportation Needs: No Transportation Needs  . Lack of Transportation (Medical): No  . Lack of Transportation (Non-Medical): No  Physical Activity: Inactive  . Days of Exercise per Week: 0 days  . Minutes of Exercise per Session: 0 min  Stress: No Stress Concern Present  . Feeling of Stress : Not at all  Social Connections: Unknown  . Frequency of Communication with Friends and Family: Patient refused  . Frequency of Social Gatherings with Friends and Family: Patient refused  . Attends Religious Services: Patient refused  . Active Member of Clubs or Organizations: Patient refused  . Attends Archivist Meetings: Patient refused  . Marital Status: Patient refused  Intimate Partner Violence: Not At Risk  . Fear of Current or Ex-Partner: No  . Emotionally Abused: No  . Physically  Abused: No  . Sexually Abused: No    FAMILY HISTORY: Family History  Problem Relation Age of Onset  . Lymphoma Father   . Liver cancer Paternal Uncle   . Lung cancer Paternal Uncle   . Lung cancer Maternal Uncle     ALLERGIES:  is allergic to ferumoxytol, amoxicillin, and codeine.  MEDICATIONS:  Current Outpatient Medications  Medication Sig Dispense Refill  . amLODipine (NORVASC) 10 MG tablet Take 10 mg by mouth every morning.     . budesonide-formoterol (SYMBICORT) 160-4.5 MCG/ACT inhaler Inhale 2 puffs into the lungs 2 (two) times daily.    . DULoxetine (CYMBALTA) 30 MG capsule Take 30 mg by mouth every morning.     . ferrous sulfate 324 (65 Fe) MG TBEC Take 324 mg by mouth daily.     Marland Kitchen gabapentin (NEURONTIN) 300 MG capsule Take 600 mg by mouth 3 (three) times daily.     . hydrochlorothiazide (HYDRODIURIL) 25 MG tablet Take 25 mg by mouth daily.     Marland Kitchen HYDROcodone-acetaminophen (NORCO/VICODIN) 5-325 MG tablet Tale one tablet a day as needed for pain. 45 tablet  0  . insulin glargine (LANTUS) 100 UNIT/ML injection Inject into the skin daily. Units based By sliding scale    . Ipratropium-Albuterol (COMBIVENT) 20-100 MCG/ACT AERS respimat Inhale into the lungs.    . lidocaine-prilocaine (EMLA) cream Apply 1 application topically as needed. 30-45 mins prior to port access. 30 g 0  . linaclotide (LINZESS) 145 MCG CAPS capsule Take 145 mcg by mouth daily before breakfast.    . lovastatin (MEVACOR) 20 MG tablet Take 20 mg by mouth every evening.    . metFORMIN (GLUCOPHAGE) 1000 MG tablet Take 1,000 mg by mouth 2 (two) times daily with a meal.     . metFORMIN (GLUCOPHAGE-XR) 500 MG 24 hr tablet Take 1,000 mg by mouth 2 (two) times daily.    Marland Kitchen omeprazole (PRILOSEC) 40 MG capsule Take 1 capsule (40 mg total) by mouth daily. 90 capsule 1  . ondansetron (ZOFRAN) 8 MG tablet Take 1 tablet (8 mg total) by mouth every 8 (eight) hours as needed for nausea or vomiting (start 3 days; after chemo). 40  tablet 1  . polyethylene glycol powder (MIRALAX) 17 GM/SCOOP powder Mix full container in 64 ounces of Gatorade or other clear liquid. NO RED Liquids 238 g 0  . prazosin (MINIPRESS) 1 MG capsule Take 1 mg by mouth at bedtime.    . psyllium (METAMUCIL) 58.6 % powder Take 1 packet by mouth daily.    Marland Kitchen pyridOXINE (VITAMIN B-6) 100 MG tablet Take 100 mg by mouth daily.    . sitaGLIPtin (JANUVIA) 25 MG tablet Take 25 mg by mouth daily.    . vitamin B-12 (CYANOCOBALAMIN) 100 MCG tablet Take 100 mcg by mouth daily.    . Multiple Vitamins-Minerals (MULTIVITAMIN WITH MINERALS) tablet Take 1 tablet by mouth daily.  (Patient not taking: Reported on 03/23/2020)     No current facility-administered medications for this visit.      Marland Kitchen  PHYSICAL EXAMINATION: ECOG PERFORMANCE STATUS: 0 - Asymptomatic  Vitals:   04/06/20 0827  BP: (!) 169/78  Pulse: 71  Resp: 16  Temp: (!) 97.3 F (36.3 C)  SpO2: 100%   Filed Weights   04/06/20 0827  Weight: 141 lb 9.6 oz (64.2 kg)    Physical Exam HENT:     Head: Normocephalic and atraumatic.     Mouth/Throat:     Pharynx: No oropharyngeal exudate.  Eyes:     Pupils: Pupils are equal, round, and reactive to light.  Cardiovascular:     Rate and Rhythm: Normal rate and regular rhythm.  Pulmonary:     Effort: No respiratory distress.     Breath sounds: No wheezing.  Abdominal:     General: Bowel sounds are normal. There is no distension.     Palpations: Abdomen is soft. There is no mass.     Tenderness: There is no abdominal tenderness. There is no guarding or rebound.     Comments: Abdominal incision well-healed.  Musculoskeletal:        General: No tenderness. Normal range of motion.     Cervical back: Normal range of motion and neck supple.  Skin:    General: Skin is warm.  Neurological:     Mental Status: He is alert and oriented to person, place, and time.  Psychiatric:        Mood and Affect: Affect normal.     LABORATORY DATA:  I have  reviewed the data as listed Lab Results  Component Value Date   WBC 6.4 04/06/2020  HGB 12.3 (L) 04/06/2020   HCT 35.6 (L) 04/06/2020   MCV 87.0 04/06/2020   PLT 161 04/06/2020   Recent Labs    02/24/20 0819 02/24/20 0819 03/09/20 0813 03/23/20 0850 04/06/20 0813  NA 134*   < > 132* 136 135  K 4.1   < > 3.9 3.6 4.2  CL 98   < > 97* 100 100  CO2 26   < > $R'25 25 25  'pz$ GLUCOSE 196*   < > 260* 170* 141*  BUN 33*   < > $R'16 17 19  'TX$ CREATININE 0.96   < > 0.96 0.97 1.01  CALCIUM 8.6*   < > 8.6* 9.1 9.0  GFRNONAA >60   < > >60 >60 >60  GFRAA >60  --  >60 >60  --   PROT 6.8   < > 6.8 6.9 6.9  ALBUMIN 3.6   < > 3.6 3.8 3.9  AST 20   < > $R'26 20 21  'ey$ ALT 17   < > $R'19 18 14  'AX$ ALKPHOS 59   < > 64 59 56  BILITOT 0.3   < > 0.6 0.6 0.4   < > = values in this interval not displayed.    RADIOGRAPHIC STUDIES: I have personally reviewed the radiological images as listed and agreed with the findings in the report. No results found.  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  # Anemia- secondary- sec to chemo-hemoglobin on 11-12-STABLE  # Back pain- MRI lumbar spine- July 2021-NEG for mets; severe arthritic changes; spinal canal stenosis- STABLE;  hydrocodone 1 day prn.  # Poorly controlled diabetes blood sugars-better controlled as per patients.; over all stable;  FBG-110; await from this AM.   # LEFT hilar/LUL-  Limited stage small cell cancer-s/p chemo-RT; CT scan chest February 23, 2020-LUL radiation changes-STABLE  # HTN- 168s/ 70-on Avastin; Elevated; recommend checking BP cuff at home; bring al og at next visit.   # DISPOSITION: # today 5FU + avastin;  d-3- pump de-access # follow up in 2  weeks-MD; labs-cbc/cmp/cea; UA; 5FU + avastin; D-3 pump de-access;Dr.B     All questions were answered. The patient knows to call the clinic with any problems, questions or concerns.    Cammie Sickle, MD 04/06/2020 9:04 AM

## 2020-04-06 NOTE — Progress Notes (Signed)
OK to proceed with treatment today with BP 169/78 per Dr. Rogue Bussing.

## 2020-04-07 LAB — CEA: CEA: 3.9 ng/mL (ref 0.0–4.7)

## 2020-04-08 ENCOUNTER — Inpatient Hospital Stay: Payer: BC Managed Care – PPO

## 2020-04-08 ENCOUNTER — Other Ambulatory Visit: Payer: Self-pay

## 2020-04-08 DIAGNOSIS — Z7189 Other specified counseling: Secondary | ICD-10-CM

## 2020-04-08 DIAGNOSIS — C182 Malignant neoplasm of ascending colon: Secondary | ICD-10-CM

## 2020-04-08 MED ORDER — SODIUM CHLORIDE 0.9% FLUSH
10.0000 mL | INTRAVENOUS | Status: DC | PRN
  Administered 2020-04-08: 10 mL
  Filled 2020-04-08: qty 10

## 2020-04-08 MED ORDER — HEPARIN SOD (PORK) LOCK FLUSH 100 UNIT/ML IV SOLN
500.0000 [IU] | Freq: Once | INTRAVENOUS | Status: AC | PRN
  Administered 2020-04-08: 500 [IU]
  Filled 2020-04-08: qty 5

## 2020-04-19 ENCOUNTER — Other Ambulatory Visit: Payer: Self-pay

## 2020-04-19 DIAGNOSIS — C182 Malignant neoplasm of ascending colon: Secondary | ICD-10-CM

## 2020-04-19 DIAGNOSIS — C781 Secondary malignant neoplasm of mediastinum: Secondary | ICD-10-CM

## 2020-04-19 DIAGNOSIS — C801 Malignant (primary) neoplasm, unspecified: Secondary | ICD-10-CM

## 2020-04-20 ENCOUNTER — Inpatient Hospital Stay: Payer: BC Managed Care – PPO | Admitting: Internal Medicine

## 2020-04-20 ENCOUNTER — Encounter: Payer: Self-pay | Admitting: Internal Medicine

## 2020-04-20 ENCOUNTER — Inpatient Hospital Stay: Payer: BC Managed Care – PPO

## 2020-04-20 ENCOUNTER — Other Ambulatory Visit: Payer: Self-pay

## 2020-04-20 DIAGNOSIS — C801 Malignant (primary) neoplasm, unspecified: Secondary | ICD-10-CM

## 2020-04-20 DIAGNOSIS — C781 Secondary malignant neoplasm of mediastinum: Secondary | ICD-10-CM

## 2020-04-20 DIAGNOSIS — Z7189 Other specified counseling: Secondary | ICD-10-CM

## 2020-04-20 DIAGNOSIS — C182 Malignant neoplasm of ascending colon: Secondary | ICD-10-CM

## 2020-04-20 LAB — URINALYSIS, COMPLETE (UACMP) WITH MICROSCOPIC
Bilirubin Urine: NEGATIVE
Glucose, UA: 50 mg/dL — AB
Hgb urine dipstick: NEGATIVE
Ketones, ur: NEGATIVE mg/dL
Nitrite: NEGATIVE
Protein, ur: NEGATIVE mg/dL
Specific Gravity, Urine: 1.006 (ref 1.005–1.030)
pH: 6 (ref 5.0–8.0)

## 2020-04-20 LAB — CBC WITH DIFFERENTIAL/PLATELET
Abs Immature Granulocytes: 0.02 10*3/uL (ref 0.00–0.07)
Basophils Absolute: 0.1 10*3/uL (ref 0.0–0.1)
Basophils Relative: 1 %
Eosinophils Absolute: 0.5 10*3/uL (ref 0.0–0.5)
Eosinophils Relative: 8 %
HCT: 37.1 % — ABNORMAL LOW (ref 39.0–52.0)
Hemoglobin: 12.4 g/dL — ABNORMAL LOW (ref 13.0–17.0)
Immature Granulocytes: 0 %
Lymphocytes Relative: 32 %
Lymphs Abs: 1.8 10*3/uL (ref 0.7–4.0)
MCH: 29.5 pg (ref 26.0–34.0)
MCHC: 33.4 g/dL (ref 30.0–36.0)
MCV: 88.3 fL (ref 80.0–100.0)
Monocytes Absolute: 0.6 10*3/uL (ref 0.1–1.0)
Monocytes Relative: 10 %
Neutro Abs: 2.8 10*3/uL (ref 1.7–7.7)
Neutrophils Relative %: 49 %
Platelets: 148 10*3/uL — ABNORMAL LOW (ref 150–400)
RBC: 4.2 MIL/uL — ABNORMAL LOW (ref 4.22–5.81)
RDW: 16.5 % — ABNORMAL HIGH (ref 11.5–15.5)
WBC: 5.7 10*3/uL (ref 4.0–10.5)
nRBC: 0 % (ref 0.0–0.2)

## 2020-04-20 LAB — COMPREHENSIVE METABOLIC PANEL
ALT: 14 U/L (ref 0–44)
AST: 17 U/L (ref 15–41)
Albumin: 3.8 g/dL (ref 3.5–5.0)
Alkaline Phosphatase: 53 U/L (ref 38–126)
Anion gap: 9 (ref 5–15)
BUN: 18 mg/dL (ref 8–23)
CO2: 27 mmol/L (ref 22–32)
Calcium: 9.2 mg/dL (ref 8.9–10.3)
Chloride: 102 mmol/L (ref 98–111)
Creatinine, Ser: 1.02 mg/dL (ref 0.61–1.24)
GFR, Estimated: >60 mL/min (ref >60)
Glucose, Bld: 113 mg/dL — ABNORMAL HIGH (ref 70–99)
Potassium: 4.5 mmol/L (ref 3.5–5.1)
Sodium: 138 mmol/L (ref 135–145)
Total Bilirubin: 0.5 mg/dL (ref 0.3–1.2)
Total Protein: 6.9 g/dL (ref 6.5–8.1)

## 2020-04-20 MED ORDER — SODIUM CHLORIDE 0.9% FLUSH
10.0000 mL | Freq: Once | INTRAVENOUS | Status: AC
  Administered 2020-04-20: 10 mL via INTRAVENOUS
  Filled 2020-04-20: qty 10

## 2020-04-20 MED ORDER — HEPARIN SOD (PORK) LOCK FLUSH 100 UNIT/ML IV SOLN
500.0000 [IU] | Freq: Once | INTRAVENOUS | Status: DC
  Filled 2020-04-20: qty 5

## 2020-04-20 MED ORDER — SODIUM CHLORIDE 0.9 % IV SOLN
2400.0000 mg/m2 | INTRAVENOUS | Status: DC
  Administered 2020-04-20: 4200 mg via INTRAVENOUS
  Filled 2020-04-20: qty 84

## 2020-04-20 MED ORDER — SODIUM CHLORIDE 0.9% FLUSH
10.0000 mL | INTRAVENOUS | Status: DC | PRN
  Administered 2020-04-20: 10 mL
  Filled 2020-04-20: qty 10

## 2020-04-20 MED ORDER — SODIUM CHLORIDE 0.9 % IV SOLN
4.0000 mg | Freq: Once | INTRAVENOUS | Status: DC
  Filled 2020-04-20: qty 0.4

## 2020-04-20 MED ORDER — SODIUM CHLORIDE 0.9 % IV SOLN
5.0000 mg/kg | Freq: Once | INTRAVENOUS | Status: AC
  Administered 2020-04-20: 300 mg via INTRAVENOUS
  Filled 2020-04-20: qty 12

## 2020-04-20 MED ORDER — SODIUM CHLORIDE 0.9 % IV SOLN
Freq: Once | INTRAVENOUS | Status: AC
  Filled 2020-04-20: qty 250

## 2020-04-20 MED ORDER — DEXAMETHASONE SODIUM PHOSPHATE 10 MG/ML IJ SOLN
4.0000 mg | Freq: Once | INTRAMUSCULAR | Status: AC
  Administered 2020-04-20: 4 mg via INTRAVENOUS
  Filled 2020-04-20: qty 1

## 2020-04-20 NOTE — Progress Notes (Signed)
Patient here for oncology follow-up appointment, expresses complaints of headaches from high blood pressure, now taking new BP medication, improving per patient.

## 2020-04-20 NOTE — Assessment & Plan Note (Addendum)
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   # proceed with 5FU with Avastin;  Labs today reviewed;  acceptable for treatment today. Will order imaging at next visit.   # Anemia- secondary- sec to chemo-hemoglobin on 11-12-STABLE.   # Back pain- MRI lumbar spine- July 2021-NEG for mets; severe arthritic changes; spinal canal stenosis- STABLE;  hydrocodone 1 day prn.  # Poorly controlled diabetes blood sugars-better controlled as per patients.; over all stable;  FBG-114 STABLE.   # LEFT hilar/LUL-  Limited stage small cell cancer-s/p chemo-RT; CT scan chest February 23, 2020-LUL radiation changes-STABLE.   # HTN- 168s/ 70-on Avastin; today- 482N systolic- STABLE.    # DISPOSITION: # today 5FU + avastin;  d-3- pump de-access # follow up in 2 weeks-MD; labs-cbc/cmp/cea; UA; 5FU + avastin; D-3 pump de-access;Dr.B

## 2020-04-20 NOTE — Addendum Note (Signed)
Addended by: Delice Bison E on: 04/20/2020 01:50 PM   Modules accepted: Orders

## 2020-04-20 NOTE — Progress Notes (Signed)
[place] NOTE  Patient Care Team: Laneta Simmers, NP as PCP - General (Nurse Practitioner) Telford Nab, RN as Registered Nurse Clent Jacks, RN as Oncology Nurse Navigator  CHIEF COMPLAINTS/PURPOSE OF CONSULTATION: Lung cancer/colon cancer   Oncology History Overview Note  # July 2020- SMALL CELL CA METASTATIC TO MEDIASTINAL LN [open Bx; Dr.Oaks]; TxN2M0; July 2nd 2020-PET- 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  MRI brain negative  # aug 17th 2020-carbo etoposide-RT [? 8/19]; #4 cycle carbo-Etop [finished dec 30th,2020]  #August 2020 iron deficient anemia-question etiology; IV Feraheme [no colonoscopy]  # OCT 2020- colo/ Cecal adeno ca; MRI liver- 1 cm enhancing lesion "metastasis" [Dr.Anna/Dr.Cannon]-difficulty biopsy.  November 02-2019 right hemicolectomy- STAGE III [pT3pN1 (2/13LN)]- HOLD adjuvant therapy. MMR- INTACT/LOW  #May 2021-liver biopsy-possible adenocarcinoma; colorectal origin.  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 ([name])  01/29/2019 Initial Diagnosis   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 [facility])   02/09/2019 - 06/24/2019 Chemotherapy   The patient had palonosetron (ALOXI) injection 0.25 mg, 0.25 mg, Intravenous,  Once, 4 of 4 cycles Administration: 0.25 mg (02/09/2019), 0.25 mg  (03/03/2019), 0.25 mg (06/02/2019), 0.25 mg (06/22/2019) CARBOplatin (PARAPLATIN) 410 mg in sodium chloride 0.9 % 250 mL chemo infusion, 410 mg (100 % of original dose 414 mg), Intravenous,  Once, 4 of 4 cycles Dose modification:   (original dose 414 mg, Cycle 1) Administration: 410 mg (02/09/2019), 410 mg (03/03/2019), 410 mg (06/02/2019), 410 mg (06/22/2019) etoposide (VEPESID) 180 mg in sodium chloride 0.9 % 500 mL chemo infusion, 100 mg/m2 = 180 mg, Intravenous,  Once, 4 of 4 cycles Administration: 180 mg (02/09/2019), 180 mg (02/10/2019), 180 mg (02/11/2019), 180 mg (03/03/2019), 180 mg (03/04/2019), 180 mg (03/05/2019), 180 mg (06/02/2019), 180 mg (06/03/2019), 180 mg (06/04/2019), 180 mg (06/22/2019), 180 mg (06/23/2019), 180 mg (06/24/2019) fosaprepitant (EMEND) 150 mg, dexamethasone (DECADRON) 6 mg in sodium chloride 0.9 % 145 mL IVPB, , Intravenous,  Once, 2 of 2 cycles Administration:  (06/02/2019),  (06/22/2019)  for chemotherapy treatment.    Cancer of ascending colon ([name])  05/04/2019 Initial Diagnosis   Cancer of ascending colon ([name])   11/30/2019 -  Chemotherapy   The patient had dexamethasone (DECADRON) 4 MG tablet, 8 mg, Oral, Daily, 1 of 1 cycle, Start date: --, End date: -- palonosetron (ALOXI) injection 0.25 mg, 0.25 mg, Intravenous,  Once, 5 of 5 cycles Administration: 0.25 mg (11/30/2019), 0.25 mg (01/13/2020), 0.25 mg (01/27/2020), 0.25 mg (12/14/2019), 0.25 mg (02/10/2020) leucovorin 700 mg in dextrose 5 % 250 mL infusion, 400 mg/m2 = 700 mg, Intravenous,  Once, 6 of 6 cycles Administration: 700 mg (11/30/2019), 700 mg (12/29/2019), 700 mg (01/13/2020), 700 mg (01/27/2020), 700 mg (12/14/2019), 700 mg (02/10/2020) oxaliplatin (ELOXATIN) 150 mg in dextrose 5 % 500 mL chemo infusion, 85 mg/m2 = 150 mg, Intravenous,  Once, 5 of 5 cycles Dose modification: 68 mg/m2 (original dose 85  mg/m2, Cycle 4, Reason: Provider Judgment) Administration: 120 mg (01/13/2020), 120 mg (01/27/2020), 150 mg (12/14/2019), 120 mg  (02/10/2020) fluorouracil (ADRUCIL) 4,200 mg in sodium chloride 0.9 % 66 mL chemo infusion, 2,400 mg/m2 = 4,200 mg, Intravenous, 1 Day/Dose, 11 of 14 cycles Administration: 4,200 mg (11/30/2019), 4,200 mg (12/29/2019), 4,200 mg (01/13/2020), 4,200 mg (01/27/2020), 4,200 mg (12/14/2019), 4,200 mg (02/10/2020), 4,200 mg (02/24/2020), 4,200 mg (03/09/2020), 4,200 mg (03/23/2020), 4,200 mg (04/06/2020) bevacizumab-bvzr (ZIRABEV) 300 mg in sodium chloride 0.9 % 100 mL chemo infusion, 5 mg/kg = 300 mg, Intravenous,  Once, 11 of 14 cycles Administration: 300 mg (12/29/2019), 300 mg (01/13/2020), 300 mg (01/27/2020), 300 mg (12/14/2019), 300 mg (02/10/2020), 300 mg (02/24/2020), 300 mg (03/09/2020), 300 mg (03/23/2020), 300 mg (04/06/2020)  for chemotherapy treatment.     HISTORY OF PRESENTING ILLNESS:   Kevin Mckay 69 y.o.  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   Patient denies any new onset of shortness of breath or cough.  No worsening diarrhea.  No headaches.  No gum bleeding or nosebleeds.  Continues to take hydrocodone for back pain.   Review of Systems  Constitutional: Positive for malaise/fatigue and weight loss. Negative for chills, diaphoresis and fever.  HENT: Negative for nosebleeds and sore throat.   Eyes: Negative for double vision.  Respiratory: Positive for shortness of breath. Negative for hemoptysis and wheezing.   Cardiovascular: Negative for chest pain, palpitations and orthopnea.  Gastrointestinal: Negative for abdominal pain, blood in stool, constipation, diarrhea, heartburn, melena, nausea and vomiting.  Genitourinary: Negative for dysuria, frequency and urgency.  Musculoskeletal: Positive for back pain and joint pain.  Skin: Negative.  Negative for itching and rash.  Neurological: Positive for tingling. Negative for dizziness, focal  weakness and weakness.  Endo/Heme/Allergies: Does not bruise/bleed easily.  Psychiatric/Behavioral: Negative for depression. The patient is not nervous/anxious and does not have insomnia.      MEDICAL HISTORY:  Past Medical History:  Diagnosis Date  . Asthma   . Cancer ([place])    Metastatic small cell lung cancer  . Cerebral aneurysm   . Chronic painful diabetic neuropathy ([place])   . Depression   . Diabetes mellitus without complication ([place])   . Essential hypertension   . History of kidney stones   . Occasional tremors   . Tobacco use     SURGICAL HISTORY: Past Surgical History:  Procedure Laterality Date  . ABDOMINAL SURGERY     age 89. trauma surgery due to forklift injury- liver and spleen  . COLONOSCOPY WITH PROPOFOL N/A 03/27/2019   Procedure: COLONOSCOPY WITH PROPOFOL;  Surgeon: Jonathon Bellows, MD;  Location: [facility] ENDOSCOPY;  Service: Gastroenterology;  Laterality: N/A;  . COLOSTOMY REVISION Right 05/04/2019   Procedure: COLON RESECTION RIGHT-right hemicolectomy-open;  Surgeon: Fredirick Maudlin, MD;  Location: [facility] ORS;  Service: General;  Laterality: Right;  . ESOPHAGOGASTRODUODENOSCOPY (EGD) WITH PROPOFOL N/A 03/27/2019   Procedure: ESOPHAGOGASTRODUODENOSCOPY (EGD) WITH PROPOFOL;  Surgeon: Jonathon Bellows, MD;  Location: [facility] ENDOSCOPY;  Service: Gastroenterology;  Laterality: N/A;  . IR GENERIC HISTORICAL  05/31/2016   IR ANGIO VERTEBRAL SEL VERTEBRAL BILAT MOD SED 05/31/2016 Consuella Lose, MD [facility]-INTERV RAD  . IR GENERIC HISTORICAL  05/31/2016   IR ANGIO INTRA EXTRACRAN SEL INTERNAL CAROTID BILAT MOD SED 05/31/2016 Consuella Lose, MD [facility]INTER[facility] RAD  . PORTACATH PLACEMENT Right 02/04/2019   Procedure: INSERTION PORT-A-CATH;  Surgeon: Genevive Bi,  Marcial Pacas, MD;  Location: [facility] ORS;  Service: General;  Laterality: Right;  . THORACOTOMY Left 01/22/2019   Procedure: PRE OP BRONCH LEFT ANTERIOR THORACOTOMY WITH BIOPSY OF MEDIASTINAL MASS;  Surgeon: Hulda Marin, MD;  Location: [facility] ORS;   Service: General;  Laterality: Left;    SOCIAL HISTORY: Social History   Socioeconomic History  . Marital status: Married    Spouse name: Not on file  . Number of children: Not on file  . Years of education: Not on file  . Highest education level: Not on file  Occupational History  . Not on file  Tobacco Use  . Smoking status: Current Every Day Smoker    Packs/day: 0.50    Years: 50.00    Pack years: 25.00    Types: Cigarettes  . Smokeless tobacco: Never Used  Vaping Use  . Vaping Use: Every day  . Substances: Nicotine  Substance and Sexual Activity  . Alcohol use: No  . Drug use: Yes    Types: Barbituates, Marijuana  . Sexual activity: Not on file  Other Topics Concern  . Not on file  Social History Narrative   Lives in [place]; wife/ son/grandson [custody]; smoker; vending business; hx of alcoholism- quit at 61.    Social Determinants of Health   Financial Resource Strain: Low Risk   . Difficulty of Paying Living Expenses: Not hard at all  Food Insecurity: No Food Insecurity  . Worried About Programme researcher, broadcasting/film/video in the Last Year: Never true  . Ran Out of Food in the Last Year: Never true  Transportation Needs: No Transportation Needs  . Lack of Transportation (Medical): No  . Lack of Transportation (Non-Medical): No  Physical Activity: Inactive  . Days of Exercise per Week: 0 days  . Minutes of Exercise per Session: 0 min  Stress: No Stress Concern Present  . Feeling of Stress : Not at all  Social Connections: Unknown  . Frequency of Communication with Friends and Family: Patient refused  . Frequency of Social Gatherings with Friends and Family: Patient refused  . Attends Religious Services: Patient refused  . Active Member of Clubs or Organizations: Patient refused  . Attends Banker Meetings: Patient refused  . Marital Status: Patient refused  Intimate Partner Violence: Not At Risk  . Fear of Current or Ex-Partner: No  . Emotionally Abused:  No  . Physically Abused: No  . Sexually Abused: No    FAMILY HISTORY: Family History  Problem Relation Age of Onset  . Lymphoma Father   . Liver cancer Paternal Uncle   . Lung cancer Paternal Uncle   . Lung cancer Maternal Uncle     ALLERGIES:  is allergic to ferumoxytol, amoxicillin, and codeine.  MEDICATIONS:  Current Outpatient Medications  Medication Sig Dispense Refill  . amLODipine (NORVASC) 10 MG tablet Take 10 mg by mouth every morning.     . budesonide-formoterol (SYMBICORT) 160-4.5 MCG/ACT inhaler Inhale 2 puffs into the lungs 2 (two) times daily.    . DULoxetine (CYMBALTA) 30 MG capsule Take 30 mg by mouth every morning.     . gabapentin (NEURONTIN) 300 MG capsule Take 600 mg by mouth 3 (three) times daily.     Marland Kitchen HYDROcodone-acetaminophen (NORCO/VICODIN) 5-325 MG tablet Tale one tablet a day as needed for pain. 45 tablet 0  . insulin glargine (LANTUS) 100 UNIT/ML injection Inject into the skin daily. Units based By sliding scale    . Ipratropium-Albuterol (COMBIVENT) 20-100 MCG/ACT AERS respimat Inhale into  the lungs.    . lidocaine-prilocaine (EMLA) cream Apply 1 application topically as needed. 30-45 mins prior to port access. 30 g 0  . linaclotide (LINZESS) 145 MCG CAPS capsule Take 145 mcg by mouth daily before breakfast.    . lovastatin (MEVACOR) 20 MG tablet Take 20 mg by mouth every evening.    . metFORMIN (GLUCOPHAGE) 1000 MG tablet Take 1,000 mg by mouth 2 (two) times daily with a meal.     . metFORMIN (GLUCOPHAGE-XR) 500 MG 24 hr tablet Take 1,000 mg by mouth 2 (two) times daily.    Marland Kitchen omeprazole (PRILOSEC) 40 MG capsule Take 1 capsule (40 mg total) by mouth daily. 90 capsule 1  . ondansetron (ZOFRAN) 8 MG tablet Take 1 tablet (8 mg total) by mouth every 8 (eight) hours as needed for nausea or vomiting (start 3 days; after chemo). 40 tablet 1  . polyethylene glycol powder (MIRALAX) 17 GM/SCOOP powder Mix full container in 64 ounces of Gatorade or other clear  liquid. NO RED Liquids 238 g 0  . prazosin (MINIPRESS) 1 MG capsule Take 1 mg by mouth at bedtime.    . pyridOXINE (VITAMIN B-6) 100 MG tablet Take 100 mg by mouth daily.    . sitaGLIPtin (JANUVIA) 25 MG tablet Take 25 mg by mouth daily.    . vitamin B-12 (CYANOCOBALAMIN) 100 MCG tablet Take 100 mcg by mouth daily.    . ferrous sulfate 324 (65 Fe) MG TBEC Take 324 mg by mouth daily.  (Patient not taking: Reported on 04/20/2020)    . hydrochlorothiazide (HYDRODIURIL) 25 MG tablet Take 25 mg by mouth daily.  (Patient not taking: Reported on 04/20/2020)    . Multiple Vitamins-Minerals (MULTIVITAMIN WITH MINERALS) tablet Take 1 tablet by mouth daily.  (Patient not taking: Reported on 03/23/2020)    . psyllium (METAMUCIL) 58.6 % powder Take 1 packet by mouth daily. (Patient not taking: Reported on 04/20/2020)     No current facility-administered medications for this visit.   Facility-Administered Medications Ordered in Other Visits  Medication Dose Route Frequency Provider Last Rate Last Admin  . fluorouracil (ADRUCIL) 4,200 mg in sodium chloride 0.9 % 66 mL chemo infusion  2,400 mg/m2 (Treatment Plan Recorded) Intravenous 1 day or 1 dose Charlaine Dalton R, MD      . heparin lock flush 100 unit/mL  500 Units Intravenous Once Charlaine Dalton R, MD      . sodium chloride flush (NS) 0.9 % injection 10 mL  10 mL Intracatheter PRN Cammie Sickle, MD   10 mL at 04/20/20 0925      .  PHYSICAL EXAMINATION: ECOG PERFORMANCE STATUS: 0 - Asymptomatic  Vitals:   04/20/20 0841  BP: 132/60  Pulse: 71  Resp: 18  Temp: (!) 96.1 F (35.6 C)  SpO2: 100%   Filed Weights   04/20/20 0841  Weight: 146 lb (66.2 kg)    Physical Exam HENT:     Head: Normocephalic and atraumatic.     Mouth/Throat:     Pharynx: No oropharyngeal exudate.  Eyes:     Pupils: Pupils are equal, round, and reactive to light.  Cardiovascular:     Rate and Rhythm: Normal rate and regular rhythm.  Pulmonary:      Effort: No respiratory distress.     Breath sounds: No wheezing.  Abdominal:     General: Bowel sounds are normal. There is no distension.     Palpations: Abdomen is soft. There is no mass.  Tenderness: There is no abdominal tenderness. There is no guarding or rebound.     Comments: Abdominal incision well-healed.  Musculoskeletal:        General: No tenderness. Normal range of motion.     Cervical back: Normal range of motion and neck supple.  Skin:    General: Skin is warm.  Neurological:     Mental Status: He is alert and oriented to person, place, and time.  Psychiatric:        Mood and Affect: Affect normal.     LABORATORY DATA:  I have reviewed the data as listed Lab Results  Component Value Date   WBC 5.7 04/20/2020   HGB 12.4 (L) 04/20/2020   HCT 37.1 (L) 04/20/2020   MCV 88.3 04/20/2020   PLT 148 (L) 04/20/2020   Recent Labs    02/24/20 0819 02/24/20 0819 03/09/20 0813 03/09/20 0813 03/23/20 0850 04/06/20 0813 04/20/20 0820  NA 134*   < > 132*   < > 136 135 138  K 4.1   < > 3.9   < > 3.6 4.2 4.5  CL 98   < > 97*   < > 100 100 102  CO2 26   < > 25   < > $R'25 25 27  'Gc$ GLUCOSE 196*   < > 260*   < > 170* 141* 113*  BUN 33*   < > 16   < > $R'17 19 18  'Yp$ CREATININE 0.96   < > 0.96   < > 0.97 1.01 1.02  CALCIUM 8.6*   < > 8.6*   < > 9.1 9.0 9.2  GFRNONAA >60   < > >60   < > >60 >60 >60  GFRAA >60  --  >60  --  >60  --   --   PROT 6.8   < > 6.8   < > 6.9 6.9 6.9  ALBUMIN 3.6   < > 3.6   < > 3.8 3.9 3.8  AST 20   < > 26   < > $R'20 21 17  'dz$ ALT 17   < > 19   < > $R'18 14 14  'FW$ ALKPHOS 59   < > 64   < > 59 56 53  BILITOT 0.3   < > 0.6   < > 0.6 0.4 0.5   < > = values in this interval not displayed.    RADIOGRAPHIC STUDIES: I have personally reviewed the radiological images as listed and agreed with the findings in the report. No results found.  ASSESSMENT & PLAN:   Cancer of ascending colon ([place]) #Right colon cancer stage IV- liver metastases; adenocarcinoma; on FOLFOX  + avastin; 02/23/2020- CT-chest and pelvis-overall stable liver lesion; left upper lobe radiation changes [see below].; STABLE.   # proceed with 5FU with Avastin;  Labs today reviewed;  acceptable for treatment today. Will order imaging at next visit.   # Anemia- secondary- sec to chemo-hemoglobin on 11-12-STABLE.   # Back pain- MRI lumbar spine- July 2021-NEG for mets; severe arthritic changes; spinal canal stenosis- STABLE;  hydrocodone 1 day prn.  # Poorly controlled diabetes blood sugars-better controlled as per patients.; over all stable;  FBG-114 STABLE.   # LEFT hilar/LUL-  Limited stage small cell cancer-s/p chemo-RT; CT scan chest February 23, 2020-LUL radiation changes-STABLE.   # HTN- 168s/ 70-on Avastin; today- 846K systolic- STABLE.    # DISPOSITION: # today 5FU + avastin;  d-3- pump de-access # follow up in 2  weeks-MD; labs-cbc/cmp/cea; UA; 5FU + avastin; D-3 pump de-access;Dr.B     All questions were answered. The patient knows to call the clinic with any problems, questions or concerns.    Cammie Sickle, MD 04/20/2020 10:29 AM

## 2020-04-21 LAB — CEA: CEA: 4 ng/mL (ref 0.0–4.7)

## 2020-04-22 ENCOUNTER — Inpatient Hospital Stay: Payer: BC Managed Care – PPO

## 2020-04-22 ENCOUNTER — Other Ambulatory Visit: Payer: Self-pay

## 2020-04-22 DIAGNOSIS — C182 Malignant neoplasm of ascending colon: Secondary | ICD-10-CM | POA: Diagnosis not present

## 2020-04-22 DIAGNOSIS — Z7189 Other specified counseling: Secondary | ICD-10-CM

## 2020-04-22 MED ORDER — SODIUM CHLORIDE 0.9% FLUSH
10.0000 mL | INTRAVENOUS | Status: DC | PRN
  Administered 2020-04-22: 10 mL
  Filled 2020-04-22: qty 10

## 2020-04-22 MED ORDER — HEPARIN SOD (PORK) LOCK FLUSH 100 UNIT/ML IV SOLN
500.0000 [IU] | Freq: Once | INTRAVENOUS | Status: AC | PRN
  Administered 2020-04-22: 500 [IU]
  Filled 2020-04-22: qty 5

## 2020-05-04 ENCOUNTER — Inpatient Hospital Stay: Payer: BC Managed Care – PPO | Attending: Internal Medicine

## 2020-05-04 ENCOUNTER — Other Ambulatory Visit: Payer: Self-pay

## 2020-05-04 ENCOUNTER — Inpatient Hospital Stay: Payer: BC Managed Care – PPO | Admitting: Internal Medicine

## 2020-05-04 ENCOUNTER — Inpatient Hospital Stay: Payer: BC Managed Care – PPO

## 2020-05-04 ENCOUNTER — Encounter: Payer: Self-pay | Admitting: Internal Medicine

## 2020-05-04 DIAGNOSIS — Z5111 Encounter for antineoplastic chemotherapy: Secondary | ICD-10-CM | POA: Diagnosis present

## 2020-05-04 DIAGNOSIS — F32A Depression, unspecified: Secondary | ICD-10-CM | POA: Diagnosis not present

## 2020-05-04 DIAGNOSIS — R634 Abnormal weight loss: Secondary | ICD-10-CM | POA: Insufficient documentation

## 2020-05-04 DIAGNOSIS — C182 Malignant neoplasm of ascending colon: Secondary | ICD-10-CM

## 2020-05-04 DIAGNOSIS — G893 Neoplasm related pain (acute) (chronic): Secondary | ICD-10-CM | POA: Diagnosis not present

## 2020-05-04 DIAGNOSIS — C3490 Malignant neoplasm of unspecified part of unspecified bronchus or lung: Secondary | ICD-10-CM | POA: Insufficient documentation

## 2020-05-04 DIAGNOSIS — E1142 Type 2 diabetes mellitus with diabetic polyneuropathy: Secondary | ICD-10-CM | POA: Diagnosis not present

## 2020-05-04 DIAGNOSIS — Z79899 Other long term (current) drug therapy: Secondary | ICD-10-CM | POA: Insufficient documentation

## 2020-05-04 DIAGNOSIS — Z794 Long term (current) use of insulin: Secondary | ICD-10-CM | POA: Insufficient documentation

## 2020-05-04 DIAGNOSIS — D6481 Anemia due to antineoplastic chemotherapy: Secondary | ICD-10-CM | POA: Insufficient documentation

## 2020-05-04 DIAGNOSIS — Z7189 Other specified counseling: Secondary | ICD-10-CM

## 2020-05-04 DIAGNOSIS — R5383 Other fatigue: Secondary | ICD-10-CM | POA: Diagnosis not present

## 2020-05-04 DIAGNOSIS — M48061 Spinal stenosis, lumbar region without neurogenic claudication: Secondary | ICD-10-CM | POA: Insufficient documentation

## 2020-05-04 DIAGNOSIS — F17210 Nicotine dependence, cigarettes, uncomplicated: Secondary | ICD-10-CM | POA: Diagnosis not present

## 2020-05-04 DIAGNOSIS — C787 Secondary malignant neoplasm of liver and intrahepatic bile duct: Secondary | ICD-10-CM | POA: Insufficient documentation

## 2020-05-04 DIAGNOSIS — I10 Essential (primary) hypertension: Secondary | ICD-10-CM | POA: Diagnosis not present

## 2020-05-04 DIAGNOSIS — T451X5S Adverse effect of antineoplastic and immunosuppressive drugs, sequela: Secondary | ICD-10-CM | POA: Insufficient documentation

## 2020-05-04 LAB — CBC WITH DIFFERENTIAL/PLATELET
Abs Immature Granulocytes: 0.03 10*3/uL (ref 0.00–0.07)
Basophils Absolute: 0.1 10*3/uL (ref 0.0–0.1)
Basophils Relative: 1 %
Eosinophils Absolute: 0.5 10*3/uL (ref 0.0–0.5)
Eosinophils Relative: 7 %
HCT: 35.4 % — ABNORMAL LOW (ref 39.0–52.0)
Hemoglobin: 12 g/dL — ABNORMAL LOW (ref 13.0–17.0)
Immature Granulocytes: 0 %
Lymphocytes Relative: 27 %
Lymphs Abs: 1.9 10*3/uL (ref 0.7–4.0)
MCH: 29.9 pg (ref 26.0–34.0)
MCHC: 33.9 g/dL (ref 30.0–36.0)
MCV: 88.3 fL (ref 80.0–100.0)
Monocytes Absolute: 0.6 10*3/uL (ref 0.1–1.0)
Monocytes Relative: 9 %
Neutro Abs: 3.8 10*3/uL (ref 1.7–7.7)
Neutrophils Relative %: 56 %
Platelets: 145 10*3/uL — ABNORMAL LOW (ref 150–400)
RBC: 4.01 MIL/uL — ABNORMAL LOW (ref 4.22–5.81)
RDW: 16.5 % — ABNORMAL HIGH (ref 11.5–15.5)
WBC: 6.8 10*3/uL (ref 4.0–10.5)
nRBC: 0 % (ref 0.0–0.2)

## 2020-05-04 LAB — URINALYSIS, COMPLETE (UACMP) WITH MICROSCOPIC
Bacteria, UA: NONE SEEN
Bilirubin Urine: NEGATIVE
Glucose, UA: NEGATIVE mg/dL
Hgb urine dipstick: NEGATIVE
Ketones, ur: NEGATIVE mg/dL
Nitrite: NEGATIVE
Protein, ur: NEGATIVE mg/dL
Specific Gravity, Urine: 1.005 (ref 1.005–1.030)
Squamous Epithelial / HPF: NONE SEEN (ref 0–5)
pH: 6 (ref 5.0–8.0)

## 2020-05-04 LAB — COMPREHENSIVE METABOLIC PANEL
ALT: 17 U/L (ref 0–44)
AST: 20 U/L (ref 15–41)
Albumin: 4.2 g/dL (ref 3.5–5.0)
Alkaline Phosphatase: 53 U/L (ref 38–126)
Anion gap: 11 (ref 5–15)
BUN: 19 mg/dL (ref 8–23)
CO2: 23 mmol/L (ref 22–32)
Calcium: 9.4 mg/dL (ref 8.9–10.3)
Chloride: 100 mmol/L (ref 98–111)
Creatinine, Ser: 1.11 mg/dL (ref 0.61–1.24)
GFR, Estimated: >60 mL/min (ref >60)
Glucose, Bld: 144 mg/dL — ABNORMAL HIGH (ref 70–99)
Potassium: 4.4 mmol/L (ref 3.5–5.1)
Sodium: 134 mmol/L — ABNORMAL LOW (ref 135–145)
Total Bilirubin: 0.7 mg/dL (ref 0.3–1.2)
Total Protein: 7.3 g/dL (ref 6.5–8.1)

## 2020-05-04 MED ORDER — SODIUM CHLORIDE 0.9 % IV SOLN
Freq: Once | INTRAVENOUS | Status: AC
  Filled 2020-05-04: qty 250

## 2020-05-04 MED ORDER — HYDROCODONE-ACETAMINOPHEN 5-325 MG PO TABS: ORAL_TABLET | ORAL | 0 refills | Status: DC

## 2020-05-04 MED ORDER — SODIUM CHLORIDE 0.9% FLUSH
10.0000 mL | INTRAVENOUS | Status: DC | PRN
  Administered 2020-05-04: 10 mL via INTRAVENOUS
  Filled 2020-05-04: qty 10

## 2020-05-04 MED ORDER — SODIUM CHLORIDE 0.9 % IV SOLN
5.0000 mg/kg | Freq: Once | INTRAVENOUS | Status: AC
  Administered 2020-05-04: 300 mg via INTRAVENOUS
  Filled 2020-05-04: qty 12

## 2020-05-04 MED ORDER — SODIUM CHLORIDE 0.9 % IV SOLN
2400.0000 mg/m2 | INTRAVENOUS | Status: DC
  Administered 2020-05-04: 4200 mg via INTRAVENOUS
  Filled 2020-05-04: qty 84

## 2020-05-04 MED ORDER — SODIUM CHLORIDE 0.9% FLUSH
10.0000 mL | INTRAVENOUS | Status: DC | PRN
  Administered 2020-05-04: 10 mL
  Filled 2020-05-04: qty 10

## 2020-05-04 MED ORDER — DEXAMETHASONE SODIUM PHOSPHATE 10 MG/ML IJ SOLN
4.0000 mg | Freq: Once | INTRAMUSCULAR | Status: AC
  Administered 2020-05-04: 4 mg via INTRAVENOUS
  Filled 2020-05-04: qty 1

## 2020-05-04 MED ORDER — SODIUM CHLORIDE 0.9 % IV SOLN: 4.0000 mg | Freq: Once | INTRAVENOUS | Status: DC

## 2020-05-04 NOTE — Progress Notes (Signed)
[place] NOTE  Patient Care Team: Laneta Simmers, NP as PCP - General (Nurse Practitioner) Telford Nab, RN as Registered Nurse Clent Jacks, RN as Oncology Nurse Navigator  CHIEF COMPLAINTS/PURPOSE OF CONSULTATION: Lung cancer/colon cancer   Oncology History Overview Note  # July 2020- SMALL CELL CA METASTATIC TO MEDIASTINAL LN [open Bx; Dr.Oaks]; TxN2M0; July 2nd 2020-PET- 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  MRI brain negative  # aug 17th 2020-carbo etoposide-RT [? 8/19]; #4 cycle carbo-Etop [finished dec 30th,2020]  #August 2020 iron deficient anemia-question etiology; IV Feraheme [no colonoscopy]  # OCT 2020- colo/ Cecal adeno ca; MRI liver- 1 cm enhancing lesion "metastasis" [Dr.Anna/Dr.Cannon]-difficulty biopsy.  November 02-2019 right hemicolectomy- STAGE III [pT3pN1 (2/13LN)]- HOLD adjuvant therapy. MMR- INTACT/LOW  #May 2021-liver biopsy-possible adenocarcinoma; colorectal origin.  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 ([name])  01/29/2019 Initial Diagnosis   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 [facility])   02/09/2019 - 06/24/2019 Chemotherapy   The patient had palonosetron (ALOXI) injection 0.25 mg, 0.25 mg, Intravenous,  Once, 4 of 4 cycles Administration: 0.25 mg (02/09/2019), 0.25 mg  (03/03/2019), 0.25 mg (06/02/2019), 0.25 mg (06/22/2019) CARBOplatin (PARAPLATIN) 410 mg in sodium chloride 0.9 % 250 mL chemo infusion, 410 mg (100 % of original dose 414 mg), Intravenous,  Once, 4 of 4 cycles Dose modification:   (original dose 414 mg, Cycle 1) Administration: 410 mg (02/09/2019), 410 mg (03/03/2019), 410 mg (06/02/2019), 410 mg (06/22/2019) etoposide (VEPESID) 180 mg in sodium chloride 0.9 % 500 mL chemo infusion, 100 mg/m2 = 180 mg, Intravenous,  Once, 4 of 4 cycles Administration: 180 mg (02/09/2019), 180 mg (02/10/2019), 180 mg (02/11/2019), 180 mg (03/03/2019), 180 mg (03/04/2019), 180 mg (03/05/2019), 180 mg (06/02/2019), 180 mg (06/03/2019), 180 mg (06/04/2019), 180 mg (06/22/2019), 180 mg (06/23/2019), 180 mg (06/24/2019) fosaprepitant (EMEND) 150 mg, dexamethasone (DECADRON) 6 mg in sodium chloride 0.9 % 145 mL IVPB, , Intravenous,  Once, 2 of 2 cycles Administration:  (06/02/2019),  (06/22/2019)  for chemotherapy treatment.    Cancer of ascending colon ([name])  05/04/2019 Initial Diagnosis   Cancer of ascending colon ([name])   11/30/2019 -  Chemotherapy   The patient had dexamethasone (DECADRON) 4 MG tablet, 8 mg, Oral, Daily, 1 of 1 cycle, Start date: --, End date: -- palonosetron (ALOXI) injection 0.25 mg, 0.25 mg, Intravenous,  Once, 5 of 5 cycles Administration: 0.25 mg (11/30/2019), 0.25 mg (01/13/2020), 0.25 mg (01/27/2020), 0.25 mg (12/14/2019), 0.25 mg (02/10/2020) leucovorin 700 mg in dextrose 5 % 250 mL infusion, 400 mg/m2 = 700 mg, Intravenous,  Once, 6 of 6 cycles Administration: 700 mg (11/30/2019), 700 mg (12/29/2019), 700 mg (01/13/2020), 700 mg (01/27/2020), 700 mg (12/14/2019), 700 mg (02/10/2020) oxaliplatin (ELOXATIN) 150 mg in dextrose 5 % 500 mL chemo infusion, 85 mg/m2 = 150 mg, Intravenous,  Once, 5 of 5 cycles Dose modification: 68 mg/m2 (original dose 85  mg/m2, Cycle 4, Reason: Provider Judgment) Administration: 120 mg (01/13/2020), 120 mg (01/27/2020), 150 mg (12/14/2019), 120 mg  (02/10/2020) fluorouracil (ADRUCIL) 4,200 mg in sodium chloride 0.9 % 66 mL chemo infusion, 2,400 mg/m2 = 4,200 mg, Intravenous, 1 Day/Dose, 11 of 14 cycles Administration: 4,200 mg (11/30/2019), 4,200 mg (12/29/2019), 4,200 mg (01/13/2020), 4,200 mg (01/27/2020), 4,200 mg (12/14/2019), 4,200 mg (02/10/2020), 4,200 mg (02/24/2020), 4,200 mg (03/09/2020), 4,200 mg (03/23/2020), 4,200 mg (04/06/2020), 4,200 mg (04/20/2020) bevacizumab-bvzr (ZIRABEV) 300 mg in sodium chloride 0.9 % 100 mL chemo infusion, 5 mg/kg = 300 mg, Intravenous,  Once, 11 of 14 cycles Administration: 300 mg (12/29/2019), 300 mg (01/13/2020), 300 mg (01/27/2020), 300 mg (12/14/2019), 300 mg (02/10/2020), 300 mg (02/24/2020), 300 mg (03/09/2020), 300 mg (03/23/2020), 300 mg (04/06/2020), 300 mg (04/20/2020)  for chemotherapy treatment.     HISTORY OF PRESENTING ILLNESS:   Kevin Mckay 69 y.o.  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    Patient continues to have intermittent back pain for which he is needing to take hydrocodone 1 pill as needed especially nighttime.   Otherwise no new shortness of breath or cough.  No worsening diarrhea.  States his blood sugars have been running 90s to 100s in the morning.  Today it was 180.  Chronic mild tingling and numbness in the extremities.  Review of Systems  Constitutional: Positive for malaise/fatigue and weight loss. Negative for chills, diaphoresis and fever.  HENT: Negative for nosebleeds and sore throat.   Eyes: Negative for double vision.  Respiratory: Positive for shortness of breath. Negative for hemoptysis and wheezing.   Cardiovascular: Negative for chest pain, palpitations and orthopnea.  Gastrointestinal: Negative for abdominal pain, blood in stool, constipation, diarrhea, heartburn, melena, nausea and vomiting.  Genitourinary: Negative  for dysuria, frequency and urgency.  Musculoskeletal: Positive for back pain and joint pain.  Skin: Negative.  Negative for itching and rash.  Neurological: Positive for tingling. Negative for dizziness, focal weakness and weakness.  Endo/Heme/Allergies: Does not bruise/bleed easily.  Psychiatric/Behavioral: Negative for depression. The patient is not nervous/anxious and does not have insomnia.      MEDICAL HISTORY:  Past Medical History:  Diagnosis Date  . Asthma   . Cancer ([place])    Metastatic small cell lung cancer  . Cerebral aneurysm   . Chronic painful diabetic neuropathy ([place])   . Depression   . Diabetes mellitus without complication ([place])   . Essential hypertension   . History of kidney stones   . Occasional tremors   . Tobacco use     SURGICAL HISTORY: Past Surgical History:  Procedure Laterality Date  . ABDOMINAL SURGERY     age 25. trauma surgery due to forklift injury- liver and spleen  . COLONOSCOPY WITH PROPOFOL N/A 03/27/2019   Procedure: COLONOSCOPY WITH PROPOFOL;  Surgeon: Jonathon Bellows, MD;  Location: [facility] ENDOSCOPY;  Service: Gastroenterology;  Laterality: N/A;  . COLOSTOMY REVISION Right 05/04/2019   Procedure: COLON RESECTION RIGHT-right hemicolectomy-open;  Surgeon: Fredirick Maudlin, MD;  Location: [facility] ORS;  Service: General;  Laterality: Right;  . ESOPHAGOGASTRODUODENOSCOPY (EGD) WITH PROPOFOL N/A 03/27/2019   Procedure: ESOPHAGOGASTRODUODENOSCOPY (EGD) WITH PROPOFOL;  Surgeon: Jonathon Bellows, MD;  Location: [facility] ENDOSCOPY;  Service: Gastroenterology;  Laterality: N/A;  . IR GENERIC HISTORICAL  05/31/2016   IR ANGIO VERTEBRAL SEL VERTEBRAL BILAT MOD SED 05/31/2016 Consuella Lose, MD [facility]-INTERV RAD  .  IR GENERIC HISTORICAL  05/31/2016   IR ANGIO INTRA EXTRACRAN SEL INTERNAL CAROTID BILAT MOD SED 05/31/2016 Consuella Lose, MD [facility]INTER[facility]  . PORTACATH PLACEMENT Right 02/04/2019   Procedure: INSERTION PORT-A-CATH;  Surgeon: [name], MD;  Location: [facility] ORS;   Service: General;  Laterality: Right;  . THORACOTOMY Left 01/22/2019   Procedure: PRE OP BRONCH LEFT ANTERIOR THORACOTOMY WITH BIOPSY OF MEDIASTINAL MASS;  Surgeon: [name], MD;  Location: [facility] ORS;  Service: General;  Laterality: Left;    SOCIAL HISTORY: Social History   Socioeconomic History  . Marital status: Married    Spouse name: Not on file  . Number of children: Not on file  . Years of education: Not on file  . Highest education level: Not on file  Occupational History  . Not on file  Tobacco Use  . Smoking status: Current Every Day Smoker    Packs/day: 0.50    Years: 50.00    Pack years: 25.00    Types: Cigarettes  . Smokeless tobacco: Never Used  Vaping Use  . Vaping Use: Every day  . Substances: Nicotine  Substance and Sexual Activity  . Alcohol use: No  . Drug use: Yes    Types: Barbituates, Marijuana  . Sexual activity: Not on file  Other Topics Concern  . Not on file  Social History Narrative   Lives in [place]; wife/ son/grandson [custody]; smoker; vending business; hx of alcoholism- quit at 11.    Social Determinants of Health   Financial Resource Strain:   . Difficulty of Paying Living Expenses: Not on file  Food Insecurity:   . Worried About Charity fundraiser in the Last Year: Not on file  . Ran Out of Food in the Last Year: Not on file  Transportation Needs:   . Lack of Transportation (Medical): Not on file  . Lack of Transportation (Non-Medical): Not on file  Physical Activity:   . Days of Exercise per Week: Not on file  . Minutes of Exercise per Session: Not on file  Stress:   . Feeling of Stress : Not on file  Social Connections:   . Frequency of Communication with Friends and Family: Not on file  . Frequency of Social Gatherings with Friends and Family: Not on file  . Attends Religious Services: Not on file  . Active Member of Clubs or Organizations: Not on file  . Attends Archivist Meetings: Not on file  . Marital  Status: Not on file  Intimate Partner Violence:   . Fear of Current or Ex-Partner: Not on file  . Emotionally Abused: Not on file  . Physically Abused: Not on file  . Sexually Abused: Not on file    FAMILY HISTORY: Family History  Problem Relation Age of Onset  . Lymphoma Father   . Liver cancer Paternal Uncle   . Lung cancer Paternal Uncle   . Lung cancer Maternal Uncle     ALLERGIES:  is allergic to ferumoxytol, amoxicillin, and codeine.  MEDICATIONS:  Current Outpatient Medications  Medication Sig Dispense Refill  . amLODipine (NORVASC) 10 MG tablet Take 10 mg by mouth every morning.     . budesonide-formoterol (SYMBICORT) 160-4.5 MCG/ACT inhaler Inhale 2 puffs into the lungs 2 (two) times daily.    . DULoxetine (CYMBALTA) 30 MG capsule Take 30 mg by mouth every morning.     . gabapentin (NEURONTIN) 800 MG tablet Take 800 mg by mouth 3 (three) times daily.    Marland Kitchen HYDROcodone-acetaminophen (  NORCO/VICODIN) 5-325 MG tablet Tale one tablet a day as needed for pain. 45 tablet 0  . insulin glargine (LANTUS) 100 UNIT/ML injection Inject into the skin daily. Units based By sliding scale    . Ipratropium-Albuterol (COMBIVENT) 20-100 MCG/ACT AERS respimat Inhale into the lungs.    . lidocaine-prilocaine (EMLA) cream Apply 1 application topically as needed. 30-45 mins prior to port access. 30 g 0  . linaclotide (LINZESS) 145 MCG CAPS capsule Take 145 mcg by mouth daily before breakfast.    . lisinopril-hydrochlorothiazide (ZESTORETIC) 10-12.5 MG tablet Take 1 tablet by mouth daily.    Marland Kitchen lovastatin (MEVACOR) 20 MG tablet Take 20 mg by mouth every evening.    . metFORMIN (GLUCOPHAGE) 1000 MG tablet Take 1,000 mg by mouth 2 (two) times daily with a meal.     . metFORMIN (GLUCOPHAGE-XR) 500 MG 24 hr tablet Take 1,000 mg by mouth 2 (two) times daily.    Marland Kitchen omeprazole (PRILOSEC) 40 MG capsule Take 1 capsule (40 mg total) by mouth daily. 90 capsule 1  . ondansetron (ZOFRAN) 8 MG tablet Take 1  tablet (8 mg total) by mouth every 8 (eight) hours as needed for nausea or vomiting (start 3 days; after chemo). 40 tablet 1  . polyethylene glycol powder (MIRALAX) 17 GM/SCOOP powder Mix full container in 64 ounces of Gatorade or other clear liquid. NO RED Liquids 238 g 0  . prazosin (MINIPRESS) 1 MG capsule Take 1 mg by mouth at bedtime.    . pyridOXINE (VITAMIN B-6) 100 MG tablet Take 100 mg by mouth daily.    . sitaGLIPtin (JANUVIA) 25 MG tablet Take 25 mg by mouth daily.    . vitamin B-12 (CYANOCOBALAMIN) 100 MCG tablet Take 100 mcg by mouth daily.     No current facility-administered medications for this visit.   Facility-Administered Medications Ordered in Other Visits  Medication Dose Route Frequency Provider Last Rate Last Admin  . sodium chloride flush (NS) 0.9 % injection 10 mL  10 mL Intravenous PRN Cammie Sickle, MD   10 mL at 05/04/20 0829      .  PHYSICAL EXAMINATION: ECOG PERFORMANCE STATUS: 0 - Asymptomatic  Vitals:   05/04/20 0834  BP: 128/64  Pulse: 73  Resp: 16  Temp: (!) 95.1 F (35.1 C)  SpO2: 100%   Filed Weights   05/04/20 0834  Weight: 145 lb (65.8 kg)    Physical Exam HENT:     Head: Normocephalic and atraumatic.     Mouth/Throat:     Pharynx: No oropharyngeal exudate.  Eyes:     Pupils: Pupils are equal, round, and reactive to light.  Cardiovascular:     Rate and Rhythm: Normal rate and regular rhythm.  Pulmonary:     Effort: No respiratory distress.     Breath sounds: No wheezing.  Abdominal:     General: Bowel sounds are normal. There is no distension.     Palpations: Abdomen is soft. There is no mass.     Tenderness: There is no abdominal tenderness. There is no guarding or rebound.     Comments: Abdominal incision well-healed.  Musculoskeletal:        General: No tenderness. Normal range of motion.     Cervical back: Normal range of motion and neck supple.  Skin:    General: Skin is warm.  Neurological:     Mental Status:  He is alert and oriented to person, place, and time.  Psychiatric:  Mood and Affect: Affect normal.     LABORATORY DATA:  I have reviewed the data as listed Lab Results  Component Value Date   WBC 6.8 05/04/2020   HGB 12.0 (L) 05/04/2020   HCT 35.4 (L) 05/04/2020   MCV 88.3 05/04/2020   PLT 145 (L) 05/04/2020   Recent Labs    02/24/20 0819 02/24/20 0819 03/09/20 0813 03/09/20 0813 03/23/20 0850 03/23/20 0850 04/06/20 0813 04/20/20 0820 05/04/20 0823  NA 134*   < > 132*   < > 136   < > 135 138 134*  K 4.1   < > 3.9   < > 3.6   < > 4.2 4.5 4.4  CL 98   < > 97*   < > 100   < > 100 102 100  CO2 26   < > 25   < > 25   < > _0 GLUCOSE 196*   < > 260*   < > 170*   < > 141* 113* 144*  BUN 33*   < > 16   < > 17   < > _1 CREATININE 0.96   < > 0.96   < > 0.97   < > 1.01 1.02 1.11  CALCIUM 8.6*   < > 8.6*   < > 9.1   < > 9.0 9.2 9.4  GFRNONAA >60   < > >60   < > >60  --  >60 >60 >60  GFRAA >60  --  >60  --  >60  --   --   --   --   PROT 6.8   < > 6.8   < > 6.9   < > 6.9 6.9 7.3  ALBUMIN 3.6   < > 3.6   < > 3.8   < > 3.9 3.8 4.2  AST 20   < > 26   < > 20   < > _2 ALT 17   < > 19   < > 18   < > _3 ALKPHOS 59   < > 64   < > 59   < > 56 53 53  BILITOT 0.3   < > 0.6   < > 0.6   < > 0.4 0.5 0.7   < > = values in this interval not displayed.    RADIOGRAPHIC STUDIES: I have personally reviewed the radiological images as listed and agreed with the findings in the report. No results found.  ASSESSMENT & PLAN:   Cancer of ascending colon ([place]) #Right colon cancer stage IV- liver metastases; adenocarcinoma; on FOLFOX + avastin; 02/23/2020- CT-chest and pelvis-overall stable liver lesion; left upper lobe radiation changes [see below].; STABLE.  # proceed with 5FU with Avastin;  Labs today reviewed;  acceptable for treatment today. Will order imaging at next visit.     # Anemia- secondary- sec to chemo-hemoglobin on 11-12- STABLE.   # Back pain- MRI  lumbar spine- July 2021-NEG for mets; severe arthritic changes; spinal canal stenosis- STABLE; new refill today; hydrocodone 1 day prn.  # Poorly controlled diabetes blood sugars-better controlled as per patients.; over all stable;  FBG-114 STABLE.   # LEFT hilar/LUL-  Limited stage small cell cancer-s/p chemo-RT; CT scan chest February 23, 2020-LUL radiation changes-STABLE.    # HTN- 168s/ 70-on Avastin; today- 924Q systolic- STABLE.    *TG # DISPOSITION:  # today 5FU + avastin;  d-3- pump  de-access # follow up in 3 weeks-MD; labs-cbc/cmp/cea; UA; 5FU + avastin; D-3 pump de-access;Dr.B     All questions were answered. The patient knows to call the clinic with any problems, questions or concerns.    Cammie Sickle, MD 05/04/2020 9:21 AM

## 2020-05-04 NOTE — Assessment & Plan Note (Addendum)
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  # proceed with 5FU with Avastin;  Labs today reviewed;  acceptable for treatment today. Will order imaging at next visit.     # Anemia- secondary- sec to chemo-hemoglobin on 11-12- STABLE.   # Back pain- MRI lumbar spine- July 2021-NEG for mets; severe arthritic changes; spinal canal stenosis- STABLE; new refill today; hydrocodone 1 day prn.  # Poorly controlled diabetes blood sugars-better controlled as per patients.; over all stable;  FBG-114 STABLE.   # LEFT hilar/LUL-  Limited stage small cell cancer-s/p chemo-RT; CT scan chest February 23, 2020-LUL radiation changes-STABLE.    # HTN- 168s/ 70-on Avastin; today- 347Q systolic- STABLE.    *TG # DISPOSITION:  # today 5FU + avastin;  d-3- pump de-access # follow up in 3 weeks-MD; labs-cbc/cmp/cea; UA; 5FU + avastin; D-3 pump de-access;Dr.B

## 2020-05-04 NOTE — Progress Notes (Signed)
1010- Patient tolerated treatment well. Patient discharged to home with Fluorouracil Pump in place.

## 2020-05-05 LAB — CEA: CEA: 4.3 ng/mL (ref 0.0–4.7)

## 2020-05-06 ENCOUNTER — Inpatient Hospital Stay: Payer: BC Managed Care – PPO

## 2020-05-06 DIAGNOSIS — Z7189 Other specified counseling: Secondary | ICD-10-CM

## 2020-05-06 DIAGNOSIS — C182 Malignant neoplasm of ascending colon: Secondary | ICD-10-CM | POA: Diagnosis not present

## 2020-05-06 MED ORDER — SODIUM CHLORIDE 0.9% FLUSH
10.0000 mL | INTRAVENOUS | Status: DC | PRN
  Administered 2020-05-06: 10 mL
  Filled 2020-05-06: qty 10

## 2020-05-06 MED ORDER — HEPARIN SOD (PORK) LOCK FLUSH 100 UNIT/ML IV SOLN
500.0000 [IU] | Freq: Once | INTRAVENOUS | Status: AC | PRN
  Administered 2020-05-06: 500 [IU]
  Filled 2020-05-06: qty 5

## 2020-05-06 MED ORDER — HEPARIN SOD (PORK) LOCK FLUSH 100 UNIT/ML IV SOLN
INTRAVENOUS | Status: AC
  Filled 2020-05-06: qty 5

## 2020-05-25 ENCOUNTER — Inpatient Hospital Stay: Payer: BC Managed Care – PPO | Admitting: Internal Medicine

## 2020-05-25 ENCOUNTER — Inpatient Hospital Stay: Payer: BC Managed Care – PPO | Attending: Internal Medicine

## 2020-05-25 ENCOUNTER — Other Ambulatory Visit: Payer: Self-pay

## 2020-05-25 ENCOUNTER — Inpatient Hospital Stay: Payer: BC Managed Care – PPO

## 2020-05-25 VITALS — BP 156/71 | HR 72 | Temp 97.6°F | Resp 18 | Ht 68.0 in | Wt 148.0 lb

## 2020-05-25 DIAGNOSIS — Z800 Family history of malignant neoplasm of digestive organs: Secondary | ICD-10-CM | POA: Diagnosis not present

## 2020-05-25 DIAGNOSIS — Z923 Personal history of irradiation: Secondary | ICD-10-CM | POA: Insufficient documentation

## 2020-05-25 DIAGNOSIS — D6481 Anemia due to antineoplastic chemotherapy: Secondary | ICD-10-CM | POA: Insufficient documentation

## 2020-05-25 DIAGNOSIS — Z801 Family history of malignant neoplasm of trachea, bronchus and lung: Secondary | ICD-10-CM | POA: Diagnosis not present

## 2020-05-25 DIAGNOSIS — C182 Malignant neoplasm of ascending colon: Secondary | ICD-10-CM

## 2020-05-25 DIAGNOSIS — Z79899 Other long term (current) drug therapy: Secondary | ICD-10-CM | POA: Insufficient documentation

## 2020-05-25 DIAGNOSIS — C3490 Malignant neoplasm of unspecified part of unspecified bronchus or lung: Secondary | ICD-10-CM | POA: Diagnosis not present

## 2020-05-25 DIAGNOSIS — C801 Malignant (primary) neoplasm, unspecified: Secondary | ICD-10-CM | POA: Diagnosis not present

## 2020-05-25 DIAGNOSIS — Z807 Family history of other malignant neoplasms of lymphoid, hematopoietic and related tissues: Secondary | ICD-10-CM | POA: Insufficient documentation

## 2020-05-25 DIAGNOSIS — Z7951 Long term (current) use of inhaled steroids: Secondary | ICD-10-CM | POA: Diagnosis not present

## 2020-05-25 DIAGNOSIS — Z5112 Encounter for antineoplastic immunotherapy: Secondary | ICD-10-CM | POA: Insufficient documentation

## 2020-05-25 DIAGNOSIS — C781 Secondary malignant neoplasm of mediastinum: Secondary | ICD-10-CM

## 2020-05-25 DIAGNOSIS — C787 Secondary malignant neoplasm of liver and intrahepatic bile duct: Secondary | ICD-10-CM | POA: Insufficient documentation

## 2020-05-25 DIAGNOSIS — T451X5D Adverse effect of antineoplastic and immunosuppressive drugs, subsequent encounter: Secondary | ICD-10-CM | POA: Insufficient documentation

## 2020-05-25 DIAGNOSIS — J45909 Unspecified asthma, uncomplicated: Secondary | ICD-10-CM | POA: Diagnosis not present

## 2020-05-25 DIAGNOSIS — F17210 Nicotine dependence, cigarettes, uncomplicated: Secondary | ICD-10-CM | POA: Insufficient documentation

## 2020-05-25 DIAGNOSIS — Z794 Long term (current) use of insulin: Secondary | ICD-10-CM | POA: Insufficient documentation

## 2020-05-25 DIAGNOSIS — I10 Essential (primary) hypertension: Secondary | ICD-10-CM | POA: Diagnosis not present

## 2020-05-25 DIAGNOSIS — E119 Type 2 diabetes mellitus without complications: Secondary | ICD-10-CM | POA: Insufficient documentation

## 2020-05-25 DIAGNOSIS — Z5111 Encounter for antineoplastic chemotherapy: Secondary | ICD-10-CM | POA: Diagnosis present

## 2020-05-25 DIAGNOSIS — E1140 Type 2 diabetes mellitus with diabetic neuropathy, unspecified: Secondary | ICD-10-CM | POA: Insufficient documentation

## 2020-05-25 DIAGNOSIS — F32A Depression, unspecified: Secondary | ICD-10-CM | POA: Insufficient documentation

## 2020-05-25 DIAGNOSIS — Z7189 Other specified counseling: Secondary | ICD-10-CM

## 2020-05-25 LAB — COMPREHENSIVE METABOLIC PANEL
ALT: 17 U/L (ref 0–44)
AST: 20 U/L (ref 15–41)
Albumin: 3.9 g/dL (ref 3.5–5.0)
Alkaline Phosphatase: 56 U/L (ref 38–126)
Anion gap: 8 (ref 5–15)
BUN: 20 mg/dL (ref 8–23)
CO2: 28 mmol/L (ref 22–32)
Calcium: 9.4 mg/dL (ref 8.9–10.3)
Chloride: 102 mmol/L (ref 98–111)
Creatinine, Ser: 1.28 mg/dL — ABNORMAL HIGH (ref 0.61–1.24)
GFR, Estimated: >60 mL/min (ref >60)
Glucose, Bld: 129 mg/dL — ABNORMAL HIGH (ref 70–99)
Potassium: 4.3 mmol/L (ref 3.5–5.1)
Sodium: 138 mmol/L (ref 135–145)
Total Bilirubin: 0.7 mg/dL (ref 0.3–1.2)
Total Protein: 7.5 g/dL (ref 6.5–8.1)

## 2020-05-25 LAB — CBC WITH DIFFERENTIAL/PLATELET
Abs Immature Granulocytes: 0.02 10*3/uL (ref 0.00–0.07)
Basophils Absolute: 0.1 10*3/uL (ref 0.0–0.1)
Basophils Relative: 1 %
Eosinophils Absolute: 0.7 10*3/uL — ABNORMAL HIGH (ref 0.0–0.5)
Eosinophils Relative: 10 %
HCT: 37.4 % — ABNORMAL LOW (ref 39.0–52.0)
Hemoglobin: 12.5 g/dL — ABNORMAL LOW (ref 13.0–17.0)
Immature Granulocytes: 0 %
Lymphocytes Relative: 29 %
Lymphs Abs: 2.2 10*3/uL (ref 0.7–4.0)
MCH: 29.8 pg (ref 26.0–34.0)
MCHC: 33.4 g/dL (ref 30.0–36.0)
MCV: 89.3 fL (ref 80.0–100.0)
Monocytes Absolute: 0.7 10*3/uL (ref 0.1–1.0)
Monocytes Relative: 9 %
Neutro Abs: 3.8 10*3/uL (ref 1.7–7.7)
Neutrophils Relative %: 51 %
Platelets: 154 10*3/uL (ref 150–400)
RBC: 4.19 MIL/uL — ABNORMAL LOW (ref 4.22–5.81)
RDW: 16 % — ABNORMAL HIGH (ref 11.5–15.5)
WBC: 7.4 10*3/uL (ref 4.0–10.5)
nRBC: 0 % (ref 0.0–0.2)

## 2020-05-25 LAB — URINALYSIS, COMPLETE (UACMP) WITH MICROSCOPIC
Bacteria, UA: NONE SEEN
Bilirubin Urine: NEGATIVE
Glucose, UA: 50 mg/dL — AB
Hgb urine dipstick: NEGATIVE
Ketones, ur: NEGATIVE mg/dL
Leukocytes,Ua: NEGATIVE
Nitrite: NEGATIVE
Protein, ur: NEGATIVE mg/dL
Specific Gravity, Urine: 1.014 (ref 1.005–1.030)
pH: 6 (ref 5.0–8.0)

## 2020-05-25 MED ORDER — SODIUM CHLORIDE 0.9 % IV SOLN
Freq: Once | INTRAVENOUS | Status: AC
  Filled 2020-05-25: qty 250

## 2020-05-25 MED ORDER — SODIUM CHLORIDE 0.9 % IV SOLN
2400.0000 mg/m2 | INTRAVENOUS | Status: DC
  Administered 2020-05-25: 4200 mg via INTRAVENOUS
  Filled 2020-05-25: qty 84

## 2020-05-25 MED ORDER — DEXAMETHASONE SODIUM PHOSPHATE 10 MG/ML IJ SOLN
4.0000 mg | Freq: Once | INTRAMUSCULAR | Status: AC
  Administered 2020-05-25: 4 mg via INTRAVENOUS
  Filled 2020-05-25: qty 1

## 2020-05-25 MED ORDER — SODIUM CHLORIDE 0.9 % IV SOLN
5.0000 mg/kg | Freq: Once | INTRAVENOUS | Status: AC
  Administered 2020-05-25: 300 mg via INTRAVENOUS
  Filled 2020-05-25: qty 12

## 2020-05-25 MED ORDER — SODIUM CHLORIDE 0.9 % IV SOLN: 4.0000 mg | Freq: Once | INTRAVENOUS | Status: DC

## 2020-05-25 NOTE — Progress Notes (Signed)
Mr.Covington tolerated treatment today without any complications.

## 2020-05-25 NOTE — Assessment & Plan Note (Addendum)
#  Right colon cancer stage IV- liver metastases; adenocarcinoma; on FOLFOX + avastin; 02/23/2020- CT-chest and pelvis-overall stable liver lesion; left upper lobe radiation changes [see below]. STABLE.   # proceed with 5FU with Avastin;  Labs today reviewed;  acceptable for treatment today. Will order imaging today.      # Anemia- secondary- sec to chemo-hemoglobin on 11-12- STABLE.    # Back pain- MRI lumbar spine- July 2021-NEG for mets; severe arthritic changes; spinal canal stenosis- STABLE;  hydrocodone 1 day prn.  # Poorly controlled diabetes blood sugars-better controlled as per patients.; over all stable;  FBG-114 STABLE.   # LEFT hilar/LUL-  Limited stage small cell cancer-s/p chemo-RT; CT scan chest February 23, 2020-LUL radiation changes- STABLE; await above CT scans.     # HTN- 150s/ 70-on Avastin- STABLE; continue current anti-HTNs. .    # DISPOSITION:  # today 5FU + avastin;  d-3- pump de-access # follow up in 2 weeks-MD; labs-cbc/cmp/cea; UA; 5FU + avastin; D-3 pump de-access;CT C/A/P prior-Dr.B

## 2020-05-25 NOTE — Progress Notes (Signed)
[place] NOTE  Patient Care Team: Laneta Simmers, NP as PCP - General (Nurse Practitioner) Telford Nab, RN as Registered Nurse Clent Jacks, RN as Oncology Nurse Navigator  CHIEF COMPLAINTS/PURPOSE OF CONSULTATION: Lung cancer/colon cancer   Oncology History Overview Note  # July 2020- SMALL CELL CA METASTATIC TO MEDIASTINAL LN [open Bx; Dr.Oaks]; TxN2M0; July 2nd 2020-PET- 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  MRI brain negative  # aug 17th 2020-carbo etoposide-RT [? 8/19]; #4 cycle carbo-Etop [finished dec 30th,2020]  #August 2020 iron deficient anemia-question etiology; IV Feraheme [no colonoscopy]  # OCT 2020- colo/ Cecal adeno ca; MRI liver- 1 cm enhancing lesion "metastasis" [Dr.Anna/Dr.Cannon]-difficulty biopsy.  November 02-2019 right hemicolectomy- STAGE III [pT3pN1 (2/13LN)]- HOLD adjuvant therapy. MMR- INTACT/LOW  #May 2021-liver biopsy-possible adenocarcinoma; colorectal origin.  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 ([name])  01/29/2019 Initial Diagnosis   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 [facility])   02/09/2019 - 06/24/2019 Chemotherapy   The patient had palonosetron (ALOXI) injection 0.25 mg, 0.25 mg, Intravenous,  Once, 4 of 4 cycles Administration: 0.25 mg (02/09/2019), 0.25 mg  (03/03/2019), 0.25 mg (06/02/2019), 0.25 mg (06/22/2019) CARBOplatin (PARAPLATIN) 410 mg in sodium chloride 0.9 % 250 mL chemo infusion, 410 mg (100 % of original dose 414 mg), Intravenous,  Once, 4 of 4 cycles Dose modification:   (original dose 414 mg, Cycle 1) Administration: 410 mg (02/09/2019), 410 mg (03/03/2019), 410 mg (06/02/2019), 410 mg (06/22/2019) etoposide (VEPESID) 180 mg in sodium chloride 0.9 % 500 mL chemo infusion, 100 mg/m2 = 180 mg, Intravenous,  Once, 4 of 4 cycles Administration: 180 mg (02/09/2019), 180 mg (02/10/2019), 180 mg (02/11/2019), 180 mg (03/03/2019), 180 mg (03/04/2019), 180 mg (03/05/2019), 180 mg (06/02/2019), 180 mg (06/03/2019), 180 mg (06/04/2019), 180 mg (06/22/2019), 180 mg (06/23/2019), 180 mg (06/24/2019) fosaprepitant (EMEND) 150 mg, dexamethasone (DECADRON) 6 mg in sodium chloride 0.9 % 145 mL IVPB, , Intravenous,  Once, 2 of 2 cycles Administration:  (06/02/2019),  (06/22/2019)  for chemotherapy treatment.    Cancer of ascending colon ([name])  05/04/2019 Initial Diagnosis   Cancer of ascending colon ([name])   11/30/2019 -  Chemotherapy   The patient had dexamethasone (DECADRON) 4 MG tablet, 8 mg, Oral, Daily, 1 of 1 cycle, Start date: --, End date: -- palonosetron (ALOXI) injection 0.25 mg, 0.25 mg, Intravenous,  Once, 5 of 5 cycles Administration: 0.25 mg (11/30/2019), 0.25 mg (01/13/2020), 0.25 mg (01/27/2020), 0.25 mg (12/14/2019), 0.25 mg (02/10/2020) leucovorin 700 mg in dextrose 5 % 250 mL infusion, 400 mg/m2 = 700 mg, Intravenous,  Once, 6 of 6 cycles Administration: 700 mg (11/30/2019), 700 mg (12/29/2019), 700 mg (01/13/2020), 700 mg (01/27/2020), 700 mg (12/14/2019), 700 mg (02/10/2020) oxaliplatin (ELOXATIN) 150 mg in dextrose 5 % 500 mL chemo infusion, 85 mg/m2 = 150 mg, Intravenous,  Once, 5 of 5 cycles Dose modification: 68 mg/m2 (original dose 85  mg/m2, Cycle 4, Reason: Provider Judgment) Administration: 120 mg (01/13/2020), 120 mg (01/27/2020), 150 mg (12/14/2019), 120 mg  (02/10/2020) fluorouracil (ADRUCIL) 4,200 mg in sodium chloride 0.9 % 66 mL chemo infusion, 2,400 mg/m2 = 4,200 mg, Intravenous, 1 Day/Dose, 13 of 16 cycles Administration: 4,200 mg (11/30/2019), 4,200 mg (12/29/2019), 4,200 mg (01/13/2020), 4,200 mg (01/27/2020), 4,200 mg (12/14/2019), 4,200 mg (02/10/2020), 4,200 mg (02/24/2020), 4,200 mg (03/09/2020), 4,200 mg (03/23/2020), 4,200 mg (04/06/2020), 4,200 mg (04/20/2020), 4,200 mg (05/04/2020), 4,200 mg (05/25/2020) bevacizumab-bvzr (ZIRABEV) 300 mg in sodium chloride 0.9 % 100 mL chemo infusion, 5 mg/kg = 300 mg, Intravenous,  Once, 13 of 16 cycles Administration: 300 mg (12/29/2019), 300 mg (01/13/2020), 300 mg (01/27/2020), 300 mg (12/14/2019), 300 mg (02/10/2020), 300 mg (02/24/2020), 300 mg (03/09/2020), 300 mg (03/23/2020), 300 mg (04/06/2020), 300 mg (04/20/2020), 300 mg (05/04/2020), 300 mg (05/25/2020)  for chemotherapy treatment.     HISTORY OF PRESENTING ILLNESS:   Kevin Mckay 69 y.o.  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   Patient denies any new onset of shortness of breath or cough.  Chronic mild shortness of breath.  Chronic tingling and numbness in extremities.  Continues to take hydrocodone for pain.  However noted to have increasing cramps in the legs.  Review of Systems  Constitutional: Positive for malaise/fatigue and weight loss. Negative for chills, diaphoresis and fever.  HENT: Negative for nosebleeds and sore throat.   Eyes: Negative for double vision.  Respiratory: Positive for shortness of breath. Negative for hemoptysis and wheezing.   Cardiovascular: Negative for chest pain, palpitations and orthopnea.  Gastrointestinal: Negative for abdominal pain, blood in stool, constipation, diarrhea, heartburn, melena, nausea and vomiting.  Genitourinary: Negative for dysuria, frequency and  urgency.  Musculoskeletal: Positive for back pain and joint pain.  Skin: Negative.  Negative for itching and rash.  Neurological: Positive for tingling. Negative for dizziness, focal weakness and weakness.  Endo/Heme/Allergies: Does not bruise/bleed easily.  Psychiatric/Behavioral: Negative for depression. The patient is not nervous/anxious and does not have insomnia.      MEDICAL HISTORY:  Past Medical History:  Diagnosis Date  . Asthma   . Cancer ([place])    Metastatic small cell lung cancer  . Cerebral aneurysm   . Chronic painful diabetic neuropathy ([place])   . Depression   . Diabetes mellitus without complication ([place])   . Essential hypertension   . History of kidney stones   . Occasional tremors   . Tobacco use     SURGICAL HISTORY: Past Surgical History:  Procedure Laterality Date  . ABDOMINAL SURGERY     age 69. trauma surgery due to forklift injury- liver and spleen  . COLONOSCOPY WITH PROPOFOL N/A 03/27/2019   Procedure: COLONOSCOPY WITH PROPOFOL;  Surgeon: Jonathon Bellows, MD;  Location: [facility] ENDOSCOPY;  Service: Gastroenterology;  Laterality: N/A;  . COLOSTOMY REVISION Right 05/04/2019   Procedure: COLON RESECTION RIGHT-right hemicolectomy-open;  Surgeon: Fredirick Maudlin, MD;  Location: [facility] ORS;  Service: General;  Laterality: Right;  . ESOPHAGOGASTRODUODENOSCOPY (EGD) WITH PROPOFOL N/A 03/27/2019   Procedure: ESOPHAGOGASTRODUODENOSCOPY (EGD) WITH PROPOFOL;  Surgeon: Jonathon Bellows, MD;  Location: [facility] ENDOSCOPY;  Service: Gastroenterology;  Laterality: N/A;  . IR GENERIC HISTORICAL  05/31/2016   IR ANGIO VERTEBRAL SEL VERTEBRAL BILAT MOD SED 05/31/2016 Consuella Lose, MD [facility]-INTERV RAD  . IR GENERIC HISTORICAL  05/31/2016   IR ANGIO INTRA EXTRACRAN  SEL INTERNAL CAROTID BILAT MOD SED 05/31/2016 Consuella Lose, MD [facility]INTER[facility]  . PORTACATH PLACEMENT Right 02/04/2019   Procedure: INSERTION PORT-A-CATH;  Surgeon: [name], MD;  Location: [facility] ORS;  Service: General;   Laterality: Right;  . THORACOTOMY Left 01/22/2019   Procedure: PRE OP BRONCH LEFT ANTERIOR THORACOTOMY WITH BIOPSY OF MEDIASTINAL MASS;  Surgeon: [name], MD;  Location: [facility] ORS;  Service: General;  Laterality: Left;    SOCIAL HISTORY: Social History   Socioeconomic History  . Marital status: Married    Spouse name: Not on file  . Number of children: Not on file  . Years of education: Not on file  . Highest education level: Not on file  Occupational History  . Not on file  Tobacco Use  . Smoking status: Current Every Day Smoker    Packs/day: 0.50    Years: 50.00    Pack years: 25.00    Types: Cigarettes  . Smokeless tobacco: Never Used  Vaping Use  . Vaping Use: Every day  . Substances: Nicotine  Substance and Sexual Activity  . Alcohol use: No  . Drug use: Yes    Types: Barbituates, Marijuana  . Sexual activity: Not on file  Other Topics Concern  . Not on file  Social History Narrative   Lives in [place]; wife/ son/grandson [custody]; smoker; vending business; hx of alcoholism- quit at 10.    Social Determinants of Health   Financial Resource Strain: Not on file  Food Insecurity: Not on file  Transportation Needs: Not on file  Physical Activity: Not on file  Stress: Not on file  Social Connections: Not on file  Intimate Partner Violence: Not on file    FAMILY HISTORY: Family History  Problem Relation Age of Onset  . Lymphoma Father   . Liver cancer Paternal Uncle   . Lung cancer Paternal Uncle   . Lung cancer Maternal Uncle     ALLERGIES:  is allergic to ferumoxytol, amoxicillin, and codeine.  MEDICATIONS:  Current Outpatient Medications  Medication Sig Dispense Refill  . amLODipine (NORVASC) 10 MG tablet Take 10 mg by mouth every morning.     . budesonide-formoterol (SYMBICORT) 160-4.5 MCG/ACT inhaler Inhale 2 puffs into the lungs 2 (two) times daily.    . DULoxetine (CYMBALTA) 30 MG capsule Take 30 mg by mouth every morning.     . gabapentin  (NEURONTIN) 800 MG tablet Take 800 mg by mouth 3 (three) times daily.    Marland Kitchen HYDROcodone-acetaminophen (NORCO/VICODIN) 5-325 MG tablet Tale one tablet a day as needed for pain. 45 tablet 0  . insulin glargine (LANTUS) 100 UNIT/ML injection Inject into the skin daily. Units based By sliding scale    . Ipratropium-Albuterol (COMBIVENT) 20-100 MCG/ACT AERS respimat Inhale into the lungs.    . lidocaine-prilocaine (EMLA) cream Apply 1 application topically as needed. 30-45 mins prior to port access. 30 g 0  . linaclotide (LINZESS) 145 MCG CAPS capsule Take 145 mcg by mouth daily before breakfast.    . lisinopril-hydrochlorothiazide (ZESTORETIC) 10-12.5 MG tablet Take 1 tablet by mouth daily.    Marland Kitchen lovastatin (MEVACOR) 20 MG tablet Take 20 mg by mouth every evening.    . metFORMIN (GLUCOPHAGE) 1000 MG tablet Take 1,000 mg by mouth 2 (two) times daily with a meal.     . metFORMIN (GLUCOPHAGE-XR) 500 MG 24 hr tablet Take 1,000 mg by mouth 2 (two) times daily.    Marland Kitchen omeprazole (PRILOSEC) 40 MG capsule Take 1 capsule (40 mg total)  by mouth daily. 90 capsule 1  . ondansetron (ZOFRAN) 8 MG tablet Take 1 tablet (8 mg total) by mouth every 8 (eight) hours as needed for nausea or vomiting (start 3 days; after chemo). 40 tablet 1  . polyethylene glycol powder (MIRALAX) 17 GM/SCOOP powder Mix full container in 64 ounces of Gatorade or other clear liquid. NO RED Liquids 238 g 0  . prazosin (MINIPRESS) 1 MG capsule Take 1 mg by mouth at bedtime.    . pyridOXINE (VITAMIN B-6) 100 MG tablet Take 100 mg by mouth daily.    . sitaGLIPtin (JANUVIA) 25 MG tablet Take 25 mg by mouth daily.    . vitamin B-12 (CYANOCOBALAMIN) 100 MCG tablet Take 100 mcg by mouth daily.     No current facility-administered medications for this visit.   Facility-Administered Medications Ordered in Other Visits  Medication Dose Route Frequency Provider Last Rate Last Admin  . heparin lock flush 100 unit/mL  500 Units Intravenous Once Cammie Sickle, MD          .  PHYSICAL EXAMINATION: ECOG PERFORMANCE STATUS: 0 - Asymptomatic  Vitals:   05/25/20 0831  BP: (!) 156/71  Pulse: 72  Resp: 18  Temp: 97.6 F (36.4 C)   Filed Weights   05/25/20 0831  Weight: 148 lb (67.1 kg)    Physical Exam HENT:     Head: Normocephalic and atraumatic.     Mouth/Throat:     Pharynx: No oropharyngeal exudate.  Eyes:     Pupils: Pupils are equal, round, and reactive to light.  Cardiovascular:     Rate and Rhythm: Normal rate and regular rhythm.  Pulmonary:     Effort: No respiratory distress.     Breath sounds: No wheezing.  Abdominal:     General: Bowel sounds are normal. There is no distension.     Palpations: Abdomen is soft. There is no mass.     Tenderness: There is no abdominal tenderness. There is no guarding or rebound.     Comments: Abdominal incision well-healed.  Musculoskeletal:        General: No tenderness. Normal range of motion.     Cervical back: Normal range of motion and neck supple.  Skin:    General: Skin is warm.  Neurological:     Mental Status: He is alert and oriented to person, place, and time.  Psychiatric:        Mood and Affect: Affect normal.     LABORATORY DATA:  I have reviewed the data as listed Lab Results  Component Value Date   WBC 7.6 06/08/2020   HGB 11.6 (L) 06/08/2020   HCT 34.0 (L) 06/08/2020   MCV 87.2 06/08/2020   PLT 154 06/08/2020   Recent Labs    02/24/20 0819 03/09/20 0813 03/23/20 0850 04/06/20 0813 04/20/20 0820 05/04/20 0823 05/25/20 0807  NA 134* 132* 136   < > 138 134* 138  K 4.1 3.9 3.6   < > 4.5 4.4 4.3  CL 98 97* 100   < > 102 100 102  CO2 $Re'26 25 25   'fgE$ < > $R'27 23 28  '[place]$ GLUCOSE 196* 260* 170*   < > 113* 144* 129*  BUN 33* 16 17   < > $R'18 19 20  'ma$ CREATININE 0.96 0.96 0.97   < > 1.02 1.11 1.28*  CALCIUM 8.6* 8.6* 9.1   < > 9.2 9.4 9.4  GFRNONAA >60 >60 >60   < > >60 >60 >60  GFRAA >60 >60 >60  --   --   --   --   PROT 6.8 6.8 6.9   < > 6.9 7.3 7.5   ALBUMIN 3.6 3.6 3.8   < > 3.8 4.2 3.9  AST $Re'20 26 20   'iaA$ < > $R'17 20 20  'mR$ ALT $'17 19 18   'F$ < > $R'14 17 17  'Yt$ ALKPHOS 59 64 59   < > 53 53 56  BILITOT 0.3 0.6 0.6   < > 0.5 0.7 0.7   < > = values in this interval not displayed.    RADIOGRAPHIC STUDIES: I have personally reviewed the radiological images as listed and agreed with the findings in the report. CT CHEST ABDOMEN PELVIS W CONTRAST  Result Date: 06/07/2020 CLINICAL DATA:  Small-cell lung cancer, synchronous colorectal cancer, assess treatment response EXAM: CT CHEST, ABDOMEN, AND PELVIS WITH CONTRAST TECHNIQUE: Multidetector CT imaging of the chest, abdomen and pelvis was performed following the standard protocol during bolus administration of intravenous contrast. CONTRAST:  170mL OMNIPAQUE IOHEXOL 300 MG/ML SOLN, additional oral enteric contrast COMPARISON:  02/23/2020 FINDINGS: CT CHEST FINDINGS Cardiovascular: Right chest port catheter. Aortic atherosclerosis. Normal heart size. Three-vessel coronary artery calcifications. No pericardial effusion. Mediastinum/Nodes: Unchanged appearance of soft tissue about the AP window, at the site of a previously noted FDG avid lymph node (series 2, image 24). No discretely enlarged mediastinal, hilar, or axillary lymph nodes. Thyroid gland, trachea, and esophagus demonstrate no significant findings. Lungs/Pleura: Unchanged post treatment appearance of a suprahilar mass of the left upper lobe with associated architectural distortion and fibrosis (series 3, image 61). Background of innumerable tiny centrilobular pulmonary nodules, unchanged compared to prior examination. No pleural effusion or pneumothorax. Musculoskeletal: No chest wall mass or suspicious bone lesions identified. CT ABDOMEN PELVIS FINDINGS Hepatobiliary: No significant interval change in a hypodense subcapsular lesion of the lateral liver dome, hepatic segment VII, measuring 1.1 x 1.1 cm (series 2, image 51). Hepatic steatosis. No gallstones,  gallbladder wall thickening, or biliary dilatation. Pancreas: Unremarkable. No pancreatic ductal dilatation or surrounding inflammatory changes. Spleen: Normal in size without significant abnormality. Adrenals/Urinary Tract: Adrenal glands are unremarkable. Kidneys are normal, without renal calculi, solid lesion, or hydronephrosis. Bladder is unremarkable. Stomach/Bowel: Stomach is within normal limits. Status post partial right colectomy and reanastomosis. No evidence of bowel wall thickening, distention, or inflammatory changes. Descending and sigmoid diverticulosis. Generally large burden of stool throughout the colon. Vascular/Lymphatic: Aortic atherosclerosis. Numerous splenic and gastric varices about the left upper quadrant, unchanged. No enlarged abdominal or pelvic lymph nodes. Reproductive: No mass or other abnormality. Other: No abdominal wall hernia or abnormality. No abdominopelvic ascites. Musculoskeletal: No acute or significant osseous findings. IMPRESSION: 1. Unchanged post treatment appearance of a suprahilar mass of the left upper lobe with associated architectural distortion and fibrosis. 2. Unchanged appearance of soft tissue about the AP window, at the site of a previously noted FDG avid lymph node. No discretely enlarged mediastinal, hilar, or axillary lymph nodes. 3. Redemonstrated postoperative findings status post right hemicolectomy. 4. No significant interval change in a biopsy proven metastatic lesion of the lateral liver dome. 5. No evidence of new metastatic disease in the chest, abdomen, or pelvis. 6. Hepatic steatosis. 7. Coronary artery disease. Aortic Atherosclerosis (ICD10-I70.0). Electronically Signed   By: Eddie Candle M.D.   On: 06/07/2020 10:31    ASSESSMENT & PLAN:   Cancer of ascending colon ([facility]) #Right colon cancer stage IV- liver metastases; adenocarcinoma; on FOLFOX + avastin; 02/23/2020- CT-chest  and pelvis-overall stable liver lesion; left upper lobe radiation  changes [see below]. STABLE.   # proceed with 5FU with Avastin;  Labs today reviewed;  acceptable for treatment today. Will order imaging today.      # Anemia- secondary- sec to chemo-hemoglobin on 11-12- STABLE.    # Back pain- MRI lumbar spine- July 2021-NEG for mets; severe arthritic changes; spinal canal stenosis- STABLE;  hydrocodone 1 day prn.  # Poorly controlled diabetes blood sugars-better controlled as per patients.; over all stable;  FBG-114 STABLE.   # LEFT hilar/LUL-  Limited stage small cell cancer-s/p chemo-RT; CT scan chest February 23, 2020-LUL radiation changes- STABLE; await above CT scans.     # HTN- 150s/ 70-on Avastin- STABLE; continue current anti-HTNs. .    # DISPOSITION:  # today 5FU + avastin;  d-3- pump de-access # follow up in 2 weeks-MD; labs-cbc/cmp/cea; UA; 5FU + avastin; D-3 pump de-access;CT C/A/P prior-Dr.B     All questions were answered. The patient knows to call the clinic with any problems, questions or concerns.    Cammie Sickle, MD 06/08/2020 8:49 AM

## 2020-05-26 LAB — CEA: CEA: 4.6 ng/mL (ref 0.0–4.7)

## 2020-05-27 ENCOUNTER — Inpatient Hospital Stay: Payer: BC Managed Care – PPO

## 2020-05-27 DIAGNOSIS — Z7189 Other specified counseling: Secondary | ICD-10-CM

## 2020-05-27 DIAGNOSIS — C182 Malignant neoplasm of ascending colon: Secondary | ICD-10-CM

## 2020-05-27 MED ORDER — SODIUM CHLORIDE 0.9% FLUSH
10.0000 mL | INTRAVENOUS | Status: DC | PRN
  Administered 2020-05-27: 10 mL
  Filled 2020-05-27: qty 10

## 2020-05-27 MED ORDER — HEPARIN SOD (PORK) LOCK FLUSH 100 UNIT/ML IV SOLN
500.0000 [IU] | Freq: Once | INTRAVENOUS | Status: AC | PRN
  Administered 2020-05-27: 500 [IU]
  Filled 2020-05-27: qty 5

## 2020-05-27 NOTE — Progress Notes (Signed)
Pt stable at discharge.  

## 2020-06-07 ENCOUNTER — Other Ambulatory Visit: Payer: Self-pay

## 2020-06-07 ENCOUNTER — Ambulatory Visit
Admission: RE | Admit: 2020-06-07 | Discharge: 2020-06-07 | Disposition: A | Discharge status: Home / Self Care | Payer: BC Managed Care – PPO | Source: Ambulatory Visit | Attending: Internal Medicine | Admitting: Internal Medicine

## 2020-06-07 DIAGNOSIS — C801 Malignant (primary) neoplasm, unspecified: Secondary | ICD-10-CM | POA: Diagnosis present

## 2020-06-07 DIAGNOSIS — C182 Malignant neoplasm of ascending colon: Secondary | ICD-10-CM | POA: Insufficient documentation

## 2020-06-07 DIAGNOSIS — C781 Secondary malignant neoplasm of mediastinum: Secondary | ICD-10-CM | POA: Diagnosis not present

## 2020-06-07 MED ORDER — IOHEXOL 300 MG/ML  SOLN
100.0000 mL | Freq: Once | INTRAMUSCULAR | Status: AC | PRN
  Administered 2020-06-07: 100 mL via INTRAVENOUS

## 2020-06-08 ENCOUNTER — Encounter: Payer: Self-pay | Admitting: Internal Medicine

## 2020-06-08 ENCOUNTER — Other Ambulatory Visit: Payer: Self-pay

## 2020-06-08 ENCOUNTER — Inpatient Hospital Stay: Payer: BC Managed Care – PPO | Admitting: Internal Medicine

## 2020-06-08 ENCOUNTER — Inpatient Hospital Stay: Payer: BC Managed Care – PPO

## 2020-06-08 VITALS — BP 133/68 | HR 75 | Temp 95.9°F | Resp 16 | Ht 68.0 in | Wt 149.0 lb

## 2020-06-08 DIAGNOSIS — C801 Malignant (primary) neoplasm, unspecified: Secondary | ICD-10-CM

## 2020-06-08 DIAGNOSIS — R252 Cramp and spasm: Secondary | ICD-10-CM

## 2020-06-08 DIAGNOSIS — C182 Malignant neoplasm of ascending colon: Secondary | ICD-10-CM

## 2020-06-08 DIAGNOSIS — Z7189 Other specified counseling: Secondary | ICD-10-CM

## 2020-06-08 DIAGNOSIS — C781 Secondary malignant neoplasm of mediastinum: Secondary | ICD-10-CM

## 2020-06-08 LAB — URINALYSIS, COMPLETE (UACMP) WITH MICROSCOPIC
Bacteria, UA: NONE SEEN
Bilirubin Urine: NEGATIVE
Glucose, UA: >=500 mg/dL — AB
Hgb urine dipstick: NEGATIVE
Ketones, ur: NEGATIVE mg/dL
Nitrite: NEGATIVE
Protein, ur: NEGATIVE mg/dL
Specific Gravity, Urine: 1.018 (ref 1.005–1.030)
pH: 5 (ref 5.0–8.0)

## 2020-06-08 LAB — CBC WITH DIFFERENTIAL/PLATELET
Abs Immature Granulocytes: 0.02 10*3/uL (ref 0.00–0.07)
Basophils Absolute: 0.1 10*3/uL (ref 0.0–0.1)
Basophils Relative: 1 %
Eosinophils Absolute: 0.8 10*3/uL — ABNORMAL HIGH (ref 0.0–0.5)
Eosinophils Relative: 10 %
HCT: 34 % — ABNORMAL LOW (ref 39.0–52.0)
Hemoglobin: 11.6 g/dL — ABNORMAL LOW (ref 13.0–17.0)
Immature Granulocytes: 0 %
Lymphocytes Relative: 26 %
Lymphs Abs: 2 10*3/uL (ref 0.7–4.0)
MCH: 29.7 pg (ref 26.0–34.0)
MCHC: 34.1 g/dL (ref 30.0–36.0)
MCV: 87.2 fL (ref 80.0–100.0)
Monocytes Absolute: 0.6 10*3/uL (ref 0.1–1.0)
Monocytes Relative: 8 %
Neutro Abs: 4.2 10*3/uL (ref 1.7–7.7)
Neutrophils Relative %: 55 %
Platelets: 154 10*3/uL (ref 150–400)
RBC: 3.9 MIL/uL — ABNORMAL LOW (ref 4.22–5.81)
RDW: 15.5 % (ref 11.5–15.5)
WBC: 7.6 10*3/uL (ref 4.0–10.5)
nRBC: 0 % (ref 0.0–0.2)

## 2020-06-08 LAB — COMPREHENSIVE METABOLIC PANEL
ALT: 17 U/L (ref 0–44)
AST: 20 U/L (ref 15–41)
Albumin: 3.8 g/dL (ref 3.5–5.0)
Alkaline Phosphatase: 54 U/L (ref 38–126)
Anion gap: 11 (ref 5–15)
BUN: 23 mg/dL (ref 8–23)
CO2: 25 mmol/L (ref 22–32)
Calcium: 8.8 mg/dL — ABNORMAL LOW (ref 8.9–10.3)
Chloride: 100 mmol/L (ref 98–111)
Creatinine, Ser: 1.27 mg/dL — ABNORMAL HIGH (ref 0.61–1.24)
GFR, Estimated: >60 mL/min (ref >60)
Glucose, Bld: 142 mg/dL — ABNORMAL HIGH (ref 70–99)
Potassium: 4.7 mmol/L (ref 3.5–5.1)
Sodium: 136 mmol/L (ref 135–145)
Total Bilirubin: 0.5 mg/dL (ref 0.3–1.2)
Total Protein: 6.7 g/dL (ref 6.5–8.1)

## 2020-06-08 LAB — MAGNESIUM: Magnesium: 2 mg/dL (ref 1.7–2.4)

## 2020-06-08 MED ORDER — HEPARIN SOD (PORK) LOCK FLUSH 100 UNIT/ML IV SOLN
500.0000 [IU] | Freq: Once | INTRAVENOUS | Status: DC
  Filled 2020-06-08: qty 5

## 2020-06-08 MED ORDER — SODIUM CHLORIDE 0.9 % IV SOLN
2400.0000 mg/m2 | INTRAVENOUS | Status: DC
  Administered 2020-06-08: 4200 mg via INTRAVENOUS
  Filled 2020-06-08: qty 84

## 2020-06-08 MED ORDER — SODIUM CHLORIDE 0.9 % IV SOLN
5.0000 mg/kg | Freq: Once | INTRAVENOUS | Status: AC
  Administered 2020-06-08: 300 mg via INTRAVENOUS
  Filled 2020-06-08: qty 12

## 2020-06-08 MED ORDER — SODIUM CHLORIDE 0.9 % IV SOLN
Freq: Once | INTRAVENOUS | Status: AC
  Filled 2020-06-08: qty 250

## 2020-06-08 MED ORDER — DEXAMETHASONE SODIUM PHOSPHATE 10 MG/ML IJ SOLN
4.0000 mg | Freq: Once | INTRAMUSCULAR | Status: AC
  Administered 2020-06-08: 4 mg via INTRAVENOUS
  Filled 2020-06-08: qty 1

## 2020-06-08 MED ORDER — SODIUM CHLORIDE 0.9 % IV SOLN: 4.0000 mg | Freq: Once | INTRAVENOUS | Status: DC

## 2020-06-08 MED ORDER — SODIUM CHLORIDE 0.9% FLUSH
10.0000 mL | Freq: Once | INTRAVENOUS | Status: AC
  Administered 2020-06-08: 10 mL via INTRAVENOUS
  Filled 2020-06-08: qty 10

## 2020-06-08 NOTE — Progress Notes (Signed)
[place] NOTE  Patient Care Team: Laneta Simmers, NP as PCP - General (Nurse Practitioner) Telford Nab, RN as Registered Nurse Clent Jacks, RN as Oncology Nurse Navigator  CHIEF COMPLAINTS/PURPOSE OF CONSULTATION: Lung cancer/colon cancer   Oncology History Overview Note  # July 2020- SMALL CELL CA METASTATIC TO MEDIASTINAL LN [open Bx; Dr.Oaks]; TxN2M0; July 2nd 2020-PET- 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  MRI brain negative  # aug 17th 2020-carbo etoposide-RT [? 8/19]; #4 cycle carbo-Etop [finished dec 30th,2020]  #August 2020 iron deficient anemia-question etiology; IV Feraheme [no colonoscopy]  # OCT 2020- colo/ Cecal adeno ca; MRI liver- 1 cm enhancing lesion "metastasis" [Dr.Anna/Dr.Cannon]-difficulty biopsy.  November 02-2019 right hemicolectomy- STAGE III [pT3pN1 (2/13LN)]- HOLD adjuvant therapy. MMR- INTACT/LOW  #May 2021-liver biopsy-possible adenocarcinoma; colorectal origin.  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 ([place])  01/29/2019 Initial Diagnosis   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 [facility])   02/09/2019 - 06/24/2019 Chemotherapy   The patient had palonosetron (ALOXI) injection 0.25 mg, 0.25 mg, Intravenous,  Once, 4 of 4 cycles Administration: 0.25 mg (02/09/2019), 0.25 mg  (03/03/2019), 0.25 mg (06/02/2019), 0.25 mg (06/22/2019) CARBOplatin (PARAPLATIN) 410 mg in sodium chloride 0.9 % 250 mL chemo infusion, 410 mg (100 % of original dose 414 mg), Intravenous,  Once, 4 of 4 cycles Dose modification:   (original dose 414 mg, Cycle 1) Administration: 410 mg (02/09/2019), 410 mg (03/03/2019), 410 mg (06/02/2019), 410 mg (06/22/2019) etoposide (VEPESID) 180 mg in sodium chloride 0.9 % 500 mL chemo infusion, 100 mg/m2 = 180 mg, Intravenous,  Once, 4 of 4 cycles Administration: 180 mg (02/09/2019), 180 mg (02/10/2019), 180 mg (02/11/2019), 180 mg (03/03/2019), 180 mg (03/04/2019), 180 mg (03/05/2019), 180 mg (06/02/2019), 180 mg (06/03/2019), 180 mg (06/04/2019), 180 mg (06/22/2019), 180 mg (06/23/2019), 180 mg (06/24/2019) fosaprepitant (EMEND) 150 mg, dexamethasone (DECADRON) 6 mg in sodium chloride 0.9 % 145 mL IVPB, , Intravenous,  Once, 2 of 2 cycles Administration:  (06/02/2019),  (06/22/2019)  for chemotherapy treatment.    Cancer of ascending colon ([place])  05/04/2019 Initial Diagnosis   Cancer of ascending colon ([place])   11/30/2019 -  Chemotherapy   The patient had dexamethasone (DECADRON) 4 MG tablet, 8 mg, Oral, Daily, 1 of 1 cycle, Start date: --, End date: -- palonosetron (ALOXI) injection 0.25 mg, 0.25 mg, Intravenous,  Once, 5 of 5 cycles Administration: 0.25 mg (11/30/2019), 0.25 mg (01/13/2020), 0.25 mg (01/27/2020), 0.25 mg (12/14/2019), 0.25 mg (02/10/2020) leucovorin 700 mg in dextrose 5 % 250 mL infusion, 400 mg/m2 = 700 mg, Intravenous,  Once, 6 of 6 cycles Administration: 700 mg (11/30/2019), 700 mg (12/29/2019), 700 mg (01/13/2020), 700 mg (01/27/2020), 700 mg (12/14/2019), 700 mg (02/10/2020) oxaliplatin (ELOXATIN) 150 mg in dextrose 5 % 500 mL chemo infusion, 85 mg/m2 = 150 mg, Intravenous,  Once, 5 of 5 cycles Dose modification: 68 mg/m2 (original dose 85  mg/m2, Cycle 4, Reason: Provider Judgment) Administration: 120 mg (01/13/2020), 120 mg (01/27/2020), 150 mg (12/14/2019), 120 mg  (02/10/2020) fluorouracil (ADRUCIL) 4,200 mg in sodium chloride 0.9 % 66 mL chemo infusion, 2,400 mg/m2 = 4,200 mg, Intravenous, 1 Day/Dose, 14 of 16 cycles Administration: 4,200 mg (11/30/2019), 4,200 mg (12/29/2019), 4,200 mg (01/13/2020), 4,200 mg (01/27/2020), 4,200 mg (12/14/2019), 4,200 mg (02/10/2020), 4,200 mg (02/24/2020), 4,200 mg (03/09/2020), 4,200 mg (03/23/2020), 4,200 mg (04/06/2020), 4,200 mg (04/20/2020), 4,200 mg (05/04/2020), 4,200 mg (05/25/2020), 4,200 mg (06/08/2020) bevacizumab-bvzr (ZIRABEV) 300 mg in sodium chloride 0.9 % 100 mL chemo infusion, 5 mg/kg = 300 mg, Intravenous,  Once, 14 of 16 cycles Administration: 300 mg (12/29/2019), 300 mg (01/13/2020), 300 mg (01/27/2020), 300 mg (12/14/2019), 300 mg (02/10/2020), 300 mg (02/24/2020), 300 mg (03/09/2020), 300 mg (03/23/2020), 300 mg (04/06/2020), 300 mg (04/20/2020), 300 mg (05/04/2020), 300 mg (05/25/2020), 300 mg (06/08/2020)  for chemotherapy treatment.     HISTORY OF PRESENTING ILLNESS:   Kevin Mckay 69 y.o.  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   Patient denies any new onset of shortness of breath or cough.  Chronic mild shortness of breath.  Chronic tingling and numbness in extremities.  Continues to take hydrocodone for pain.  However noted to have increasing cramps in the legs.  Review of Systems  Constitutional: Positive for malaise/fatigue and weight loss. Negative for chills, diaphoresis and fever.  HENT: Negative for nosebleeds and sore throat.   Eyes: Negative for double vision.  Respiratory: Positive for shortness of breath. Negative for hemoptysis and wheezing.   Cardiovascular: Negative for chest pain, palpitations and orthopnea.  Gastrointestinal: Negative for abdominal pain, blood in stool, constipation, diarrhea, heartburn, melena,  nausea and vomiting.  Genitourinary: Negative for dysuria, frequency and urgency.  Musculoskeletal: Positive for back pain and joint pain.  Skin: Negative.  Negative for itching and rash.  Neurological: Positive for tingling. Negative for dizziness, focal weakness and weakness.  Endo/Heme/Allergies: Does not bruise/bleed easily.  Psychiatric/Behavioral: Negative for depression. The patient is not nervous/anxious and does not have insomnia.      MEDICAL HISTORY:  Past Medical History:  Diagnosis Date  . Asthma   . Cancer ([place])    Metastatic small cell lung cancer  . Cerebral aneurysm   . Chronic painful diabetic neuropathy ([place])   . Depression   . Diabetes mellitus without complication ([place])   . Essential hypertension   . History of kidney stones   . Occasional tremors   . Tobacco use     SURGICAL HISTORY: Past Surgical History:  Procedure Laterality Date  . ABDOMINAL SURGERY     age 64. trauma surgery due to forklift injury- liver and spleen  . COLONOSCOPY WITH PROPOFOL N/A 03/27/2019   Procedure: COLONOSCOPY WITH PROPOFOL;  Surgeon: Jonathon Bellows, MD;  Location: [facility] ENDOSCOPY;  Service: Gastroenterology;  Laterality: N/A;  . COLOSTOMY REVISION Right 05/04/2019   Procedure: COLON RESECTION RIGHT-right hemicolectomy-open;  Surgeon: Fredirick Maudlin, MD;  Location: [facility] ORS;  Service: General;  Laterality: Right;  . ESOPHAGOGASTRODUODENOSCOPY (EGD) WITH PROPOFOL N/A 03/27/2019   Procedure: ESOPHAGOGASTRODUODENOSCOPY (EGD) WITH PROPOFOL;  Surgeon: Jonathon Bellows, MD;  Location: [facility] ENDOSCOPY;  Service: Gastroenterology;  Laterality: N/A;  . IR GENERIC HISTORICAL  05/31/2016   IR ANGIO VERTEBRAL SEL VERTEBRAL BILAT MOD SED 05/31/2016 Consuella Lose, MD [facility]-INTERV RAD  .  IR GENERIC HISTORICAL  05/31/2016   IR ANGIO INTRA EXTRACRAN SEL INTERNAL CAROTID BILAT MOD SED 05/31/2016 Consuella Lose, MD [facility]INTER[facility] RAD  . PORTACATH PLACEMENT Right 02/04/2019   Procedure: INSERTION PORT-A-CATH;   Surgeon: [name], MD;  Location: [facility] ORS;  Service: General;  Laterality: Right;  . THORACOTOMY Left 01/22/2019   Procedure: PRE OP BRONCH LEFT ANTERIOR THORACOTOMY WITH BIOPSY OF MEDIASTINAL MASS;  Surgeon: [name], MD;  Location: [facility] ORS;  Service: General;  Laterality: Left;    SOCIAL HISTORY: Social History   Socioeconomic History  . Marital status: Married    Spouse name: Not on file  . Number of children: Not on file  . Years of education: Not on file  . Highest education level: Not on file  Occupational History  . Not on file  Tobacco Use  . Smoking status: Current Every Day Smoker    Packs/day: 0.50    Years: 50.00    Pack years: 25.00    Types: Cigarettes  . Smokeless tobacco: Never Used  Vaping Use  . Vaping Use: Every day  . Substances: Nicotine  Substance and Sexual Activity  . Alcohol use: No  . Drug use: Yes    Types: Barbituates, Marijuana  . Sexual activity: Not on file  Other Topics Concern  . Not on file  Social History Narrative   Lives in [place]; wife/ son/grandson [custody]; smoker; vending business; hx of alcoholism- quit at 8.    Social Determinants of Health   Financial Resource Strain: Not on file  Food Insecurity: Not on file  Transportation Needs: Not on file  Physical Activity: Not on file  Stress: Not on file  Social Connections: Not on file  Intimate Partner Violence: Not on file    FAMILY HISTORY: Family History  Problem Relation Age of Onset  . Lymphoma Father   . Liver cancer Paternal Uncle   . Lung cancer Paternal Uncle   . Lung cancer Maternal Uncle     ALLERGIES:  is allergic to ferumoxytol, amoxicillin, and codeine.  MEDICATIONS:  Current Outpatient Medications  Medication Sig Dispense Refill  . amLODipine (NORVASC) 10 MG tablet Take 10 mg by mouth every morning.     . budesonide-formoterol (SYMBICORT) 160-4.5 MCG/ACT inhaler Inhale 2 puffs into the lungs 2 (two) times daily.    . DULoxetine  (CYMBALTA) 30 MG capsule Take 30 mg by mouth every morning.     . gabapentin (NEURONTIN) 800 MG tablet Take 800 mg by mouth 3 (three) times daily.    Marland Kitchen HYDROcodone-acetaminophen (NORCO/VICODIN) 5-325 MG tablet Tale one tablet a day as needed for pain. 45 tablet 0  . insulin glargine (LANTUS) 100 UNIT/ML injection Inject into the skin daily. Units based By sliding scale    . Ipratropium-Albuterol (COMBIVENT) 20-100 MCG/ACT AERS respimat Inhale into the lungs.    . lidocaine-prilocaine (EMLA) cream Apply 1 application topically as needed. 30-45 mins prior to port access. 30 g 0  . linaclotide (LINZESS) 145 MCG CAPS capsule Take 145 mcg by mouth daily before breakfast.    . lisinopril-hydrochlorothiazide (ZESTORETIC) 10-12.5 MG tablet Take 1 tablet by mouth daily.    Marland Kitchen lovastatin (MEVACOR) 20 MG tablet Take 20 mg by mouth every evening.    . metFORMIN (GLUCOPHAGE) 1000 MG tablet Take 1,000 mg by mouth 2 (two) times daily with a meal.     . metFORMIN (GLUCOPHAGE-XR) 500 MG 24 hr tablet Take 1,000 mg by mouth 2 (two) times daily.    Marland Kitchen  omeprazole (PRILOSEC) 40 MG capsule Take 1 capsule (40 mg total) by mouth daily. 90 capsule 1  . ondansetron (ZOFRAN) 8 MG tablet Take 1 tablet (8 mg total) by mouth every 8 (eight) hours as needed for nausea or vomiting (start 3 days; after chemo). 40 tablet 1  . polyethylene glycol powder (MIRALAX) 17 GM/SCOOP powder Mix full container in 64 ounces of Gatorade or other clear liquid. NO RED Liquids 238 g 0  . prazosin (MINIPRESS) 1 MG capsule Take 1 mg by mouth at bedtime.    . pyridOXINE (VITAMIN B-6) 100 MG tablet Take 100 mg by mouth daily.    . sitaGLIPtin (JANUVIA) 25 MG tablet Take 25 mg by mouth daily.    . vitamin B-12 (CYANOCOBALAMIN) 100 MCG tablet Take 100 mcg by mouth daily.     No current facility-administered medications for this visit.      Marland Kitchen  PHYSICAL EXAMINATION: ECOG PERFORMANCE STATUS: 0 - Asymptomatic  Vitals:   06/08/20 0838  BP: 133/68   Pulse: 75  Resp: 16  Temp: (!) 95.9 F (35.5 C)  SpO2: 100%   Filed Weights   06/08/20 0838  Weight: 149 lb (67.6 kg)    Physical Exam HENT:     Head: Normocephalic and atraumatic.     Mouth/Throat:     Pharynx: No oropharyngeal exudate.  Eyes:     Pupils: Pupils are equal, round, and reactive to light.  Cardiovascular:     Rate and Rhythm: Normal rate and regular rhythm.  Pulmonary:     Effort: No respiratory distress.     Breath sounds: No wheezing.  Abdominal:     General: Bowel sounds are normal. There is no distension.     Palpations: Abdomen is soft. There is no mass.     Tenderness: There is no abdominal tenderness. There is no guarding or rebound.     Comments: Abdominal incision well-healed.  Musculoskeletal:        General: No tenderness. Normal range of motion.     Cervical back: Normal range of motion and neck supple.  Skin:    General: Skin is warm.  Neurological:     Mental Status: He is alert and oriented to person, place, and time.  Psychiatric:        Mood and Affect: Affect normal.     LABORATORY DATA:  I have reviewed the data as listed Lab Results  Component Value Date   WBC 7.6 06/08/2020   HGB 11.6 (L) 06/08/2020   HCT 34.0 (L) 06/08/2020   MCV 87.2 06/08/2020   PLT 154 06/08/2020   Recent Labs    02/24/20 0819 03/09/20 0813 03/23/20 0850 04/06/20 0813 05/04/20 0823 05/25/20 0807 06/08/20 0825  NA 134* 132* 136   < > 134* 138 136  K 4.1 3.9 3.6   < > 4.4 4.3 4.7  CL 98 97* 100   < > 100 102 100  CO2 _0 < > _1 GLUCOSE 196* 260* 170*   < > 144* 129* 142*  BUN 33* 16 17   < > _2 CREATININE 0.96 0.96 0.97   < > 1.11 1.28* 1.27*  CALCIUM 8.6* 8.6* 9.1   < > 9.4 9.4 8.8*  GFRNONAA >60 >60 >60   < > >60 >60 >60  GFRAA >60 >60 >60  --   --   --   --   PROT 6.8 6.8 6.9   < >  7.3 7.5 6.7  ALBUMIN 3.6 3.6 3.8   < > 4.2 3.9 3.8  AST _0 < > _1 ALT _2 < > _3 ALKPHOS 59 64 59   < > 53  56 54  BILITOT 0.3 0.6 0.6   < > 0.7 0.7 0.5   < > = values in this interval not displayed.    RADIOGRAPHIC STUDIES: I have personally reviewed the radiological images as listed and agreed with the findings in the report. CT CHEST ABDOMEN PELVIS W CONTRAST  Result Date: 06/07/2020 CLINICAL DATA:  Small-cell lung cancer, synchronous colorectal cancer, assess treatment response EXAM: CT CHEST, ABDOMEN, AND PELVIS WITH CONTRAST TECHNIQUE: Multidetector CT imaging of the chest, abdomen and pelvis was performed following the standard protocol during bolus administration of intravenous contrast. CONTRAST:  177m OMNIPAQUE IOHEXOL 300 MG/ML SOLN, additional oral enteric contrast COMPARISON:  02/23/2020 FINDINGS: CT CHEST FINDINGS Cardiovascular: Right chest port catheter. Aortic atherosclerosis. Normal heart size. Three-vessel coronary artery calcifications. No pericardial effusion. Mediastinum/Nodes: Unchanged appearance of soft tissue about the AP window, at the site of a previously noted FDG avid lymph node (series 2, image 24). No discretely enlarged mediastinal, hilar, or axillary lymph nodes. Thyroid gland, trachea, and esophagus demonstrate no significant findings. Lungs/Pleura: Unchanged post treatment appearance of a suprahilar mass of the left upper lobe with associated architectural distortion and fibrosis (series 3, image 61). Background of innumerable tiny centrilobular pulmonary nodules, unchanged compared to prior examination. No pleural effusion or pneumothorax. Musculoskeletal: No chest wall mass or suspicious bone lesions identified. CT ABDOMEN PELVIS FINDINGS Hepatobiliary: No significant interval change in a hypodense subcapsular lesion of the lateral liver dome, hepatic segment VII, measuring 1.1 x 1.1 cm (series 2, image 51). Hepatic steatosis. No gallstones, gallbladder wall thickening, or biliary dilatation. Pancreas: Unremarkable. No pancreatic ductal dilatation or surrounding  inflammatory changes. Spleen: Normal in size without significant abnormality. Adrenals/Urinary Tract: Adrenal glands are unremarkable. Kidneys are normal, without renal calculi, solid lesion, or hydronephrosis. Bladder is unremarkable. Stomach/Bowel: Stomach is within normal limits. Status post partial right colectomy and reanastomosis. No evidence of bowel wall thickening, distention, or inflammatory changes. Descending and sigmoid diverticulosis. Generally large burden of stool throughout the colon. Vascular/Lymphatic: Aortic atherosclerosis. Numerous splenic and gastric varices about the left upper quadrant, unchanged. No enlarged abdominal or pelvic lymph nodes. Reproductive: No mass or other abnormality. Other: No abdominal wall hernia or abnormality. No abdominopelvic ascites. Musculoskeletal: No acute or significant osseous findings. IMPRESSION: 1. Unchanged post treatment appearance of a suprahilar mass of the left upper lobe with associated architectural distortion and fibrosis. 2. Unchanged appearance of soft tissue about the AP window, at the site of a previously noted FDG avid lymph node. No discretely enlarged mediastinal, hilar, or axillary lymph nodes. 3. Redemonstrated postoperative findings status post right hemicolectomy. 4. No significant interval change in a biopsy proven metastatic lesion of the lateral liver dome. 5. No evidence of new metastatic disease in the chest, abdomen, or pelvis. 6. Hepatic steatosis. 7. Coronary artery disease. Aortic Atherosclerosis (ICD10-I70.0). Electronically Signed   By: AEddie CandleM.D.   On: 06/07/2020 10:31    ASSESSMENT & PLAN:   [place] colon ([place] #Right colon cancer stage IV- liver metastases; adenocarcinoma; on 5FU + avastin; CT DEC 12th, 2021-  Unchanged post treatment appearance of a suprahilar mass of the left upper lobe with associated architectural distortion and fibrosis. Unchanged appearance of  soft tissue about the AP window, at  the site of a previously noted FDG avid lymph node. No discretely enlarged mediastinal, hilar, or axillary lymph nodes. Redemonstrated postoperative findings status post right Hemicolectomy. No significant interval change in a biopsy proven metastatic lesion of the lateral liver dome.  No evidence of new metastatic disease in the chest, abdomen, or pelvis. STABLE.   # proceed with 5FU with Avastin;  Labs today reviewed;  acceptable for treatment today.  Clinically stable imaging/upcoming holidays I think is reasonable for a chemo holiday.   # Anemia- secondary- sec to chemo-hemoglobin on 11-12- STABLE  # Back pain- MRI lumbar spine- July 2021-NEG for mets; severe arthritic changes; spinal canal stenosis- STABLE;  hydrocodone 1 day prn.  # Poorly controlled diabetes blood sugars-better controlled as per patients.; over all stable;  FBG-114 STABLE.   # LEFT hilar/LUL-  Limited stage small cell cancer-s/p chemo-RT; CT scan chest February 23, 2020-LUL radiation changes- STABLE;DEC 12th, 2021CT STABLE.     # HTN- 150s/ 70-on Avastin- STABLE; continue current anti-HTNs.   # bil LE cramps- recommend magnesium BID.     # DISPOSITION: *Holidays # today 5FU + avastin;  d-3- pump de-access # follow up in 4 weeks-MD; labs-cbc/cmp/cea; UA; 5FU + avastin; D-3 pump de-access-Dr.B     All questions were answered. The patient knows to call the clinic with any problems, questions or concerns.    Cammie Sickle, MD 06/12/2020 10:12 PM

## 2020-06-08 NOTE — Progress Notes (Signed)
Patient tolerated infusion well. Discharged home.  

## 2020-06-08 NOTE — Progress Notes (Signed)
States he has been having more frequent leg cramps.

## 2020-06-08 NOTE — Assessment & Plan Note (Addendum)
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 Unchanged appearance of soft tissue about the AP window, at the site of a previously noted FDG avid lymph node. No discretely enlarged mediastinal, hilar, or axillary lymph nodes. Redemonstrated postoperative findings status post right Hemicolectomy. No significant interval change in a biopsy proven metastatic lesion of the lateral liver dome.  No evidence of new metastatic disease in the chest, abdomen, or pelvis. STABLE.   # proceed with 5FU with Avastin;  Labs today reviewed;  acceptable for treatment today.  Clinically stable imaging/upcoming holidays I think is reasonable for a chemo holiday.   # Anemia- secondary- sec to chemo-hemoglobin on 11-12- STABLE  # Back pain- MRI lumbar spine- July 2021-NEG for mets; severe arthritic changes; spinal canal stenosis- STABLE;  hydrocodone 1 day prn.  # Poorly controlled diabetes blood sugars-better controlled as per patients.; over all stable;  FBG-114 STABLE.   # LEFT hilar/LUL-  Limited stage small cell cancer-s/p chemo-RT; CT scan chest February 23, 2020-LUL radiation changes- STABLE;DEC 12th, 2021CT STABLE.     # HTN- 150s/ 70-on Avastin- STABLE; continue current anti-HTNs.   # bil LE cramps- recommend magnesium BID.     # DISPOSITION: *Holidays # today 5FU + avastin;  d-3- pump de-access # follow up in 4 weeks-MD; labs-cbc/cmp/cea; UA; 5FU + avastin; D-3 pump de-access-Dr.B

## 2020-06-08 NOTE — Patient Instructions (Signed)
#   Take Magnesium 400 mg twice a day- Over the counter.

## 2020-06-09 LAB — CEA: CEA: 3.8 ng/mL (ref 0.0–4.7)

## 2020-06-10 ENCOUNTER — Other Ambulatory Visit: Payer: Self-pay

## 2020-06-10 ENCOUNTER — Inpatient Hospital Stay: Payer: BC Managed Care – PPO

## 2020-06-10 DIAGNOSIS — C182 Malignant neoplasm of ascending colon: Secondary | ICD-10-CM

## 2020-06-10 DIAGNOSIS — Z7189 Other specified counseling: Secondary | ICD-10-CM

## 2020-06-10 MED ORDER — HEPARIN SOD (PORK) LOCK FLUSH 100 UNIT/ML IV SOLN
500.0000 [IU] | Freq: Once | INTRAVENOUS | Status: AC | PRN
  Administered 2020-06-10: 500 [IU]
  Filled 2020-06-10: qty 5

## 2020-06-10 MED ORDER — SODIUM CHLORIDE 0.9% FLUSH
10.0000 mL | INTRAVENOUS | Status: DC | PRN
  Administered 2020-06-10: 10 mL
  Filled 2020-06-10: qty 10

## 2020-06-10 MED ORDER — HEPARIN SOD (PORK) LOCK FLUSH 100 UNIT/ML IV SOLN
INTRAVENOUS | Status: AC
  Filled 2020-06-10: qty 5

## 2020-06-29 ENCOUNTER — Telehealth: Payer: Self-pay | Admitting: *Deleted

## 2020-06-29 ENCOUNTER — Other Ambulatory Visit: Payer: Self-pay | Admitting: Nurse Practitioner

## 2020-06-29 DIAGNOSIS — I10 Essential (primary) hypertension: Secondary | ICD-10-CM

## 2020-06-29 DIAGNOSIS — Z720 Tobacco use: Secondary | ICD-10-CM

## 2020-06-29 DIAGNOSIS — C781 Secondary malignant neoplasm of mediastinum: Secondary | ICD-10-CM

## 2020-06-29 DIAGNOSIS — E1140 Type 2 diabetes mellitus with diabetic neuropathy, unspecified: Secondary | ICD-10-CM

## 2020-06-29 DIAGNOSIS — U071 COVID-19: Secondary | ICD-10-CM

## 2020-06-29 NOTE — Telephone Encounter (Signed)
Wife called reporting that patient tested positive for COVID yesterday after having symptoms that started on 1/3. Asking about rescheduling appointment since he is quarantined. Please call her back with new appt

## 2020-06-29 NOTE — Telephone Encounter (Signed)
I spoke with Sonia Baller - She will have mab therapy team reach out to the patient. Patient has small cell lung and colorectal cancer and under active treatment. Patient has not been vaccinated.  [place] - we will need to r/s his treatment out 3 weeks per Sonia Baller. Please arrange new apts.

## 2020-06-29 NOTE — Progress Notes (Signed)
I connected by phone with Kevin Mckay on 06/29/2020 at 11:39 AM to discuss the potential use of a new treatment for mild to moderate COVID-19 viral infection in non-hospitalized patients.  This patient is a 70 y.o. male that meets the FDA criteria for Emergency Use Authorization of COVID monoclonal antibody casirivimab/imdevimab, bamlanivimab/etesevimab, or sotrovimab.  Has a (+) direct SARS-CoV-2 viral test result  Has mild or moderate COVID-19   Is NOT hospitalized due to COVID-19  Is within 10 days of symptom onset  Has at least one of the high risk factor(s) for progression to severe COVID-19 and/or hospitalization as defined in EUA.  Specific high risk criteria : Older age (>/= 69 yo), Diabetes, Immunosuppressive Disease or Treatment and Cardiovascular disease or hypertension   I have spoken and communicated the following to the patient or parent/caregiver regarding COVID monoclonal antibody treatment:  1. FDA has authorized the emergency use for the treatment of mild to moderate COVID-19 in adults and pediatric patients with positive results of direct SARS-CoV-2 viral testing who are 49 years of age and older weighing at least 40 kg, and who are at high risk for progressing to severe COVID-19 and/or hospitalization.  2. The significant known and potential risks and benefits of COVID monoclonal antibody, and the extent to which such potential risks and benefits are unknown.  3. Information on available alternative treatments and the risks and benefits of those alternatives, including clinical trials.  4. Patients treated with COVID monoclonal antibody should continue to self-isolate and use infection control measures (e.g., wear mask, isolate, social distance, avoid sharing personal items, clean and disinfect "high touch" surfaces, and frequent handwashing) according to CDC guidelines.   5. The patient or parent/caregiver has the option to accept or refuse COVID monoclonal antibody  treatment.  After reviewing this information with the patient, the patient has agreed to receive one of the available covid 19 monoclonal antibodies and will be provided an appropriate fact sheet prior to infusion. [name], NP 06/29/2020 11:39 AM

## 2020-06-29 NOTE — Telephone Encounter (Signed)
He scheduled for mab tomorrow.   Faythe Casa, NP 06/29/2020 12:50 PM

## 2020-06-30 ENCOUNTER — Ambulatory Visit (HOSPITAL_COMMUNITY)
Admission: RE | Admit: 2020-06-30 | Discharge: 2020-06-30 | Disposition: A | Discharge status: Home / Self Care | Payer: BC Managed Care – PPO | Source: Ambulatory Visit | Attending: Pulmonary Disease | Admitting: Pulmonary Disease

## 2020-06-30 DIAGNOSIS — Z720 Tobacco use: Secondary | ICD-10-CM | POA: Diagnosis not present

## 2020-06-30 DIAGNOSIS — I10 Essential (primary) hypertension: Secondary | ICD-10-CM | POA: Diagnosis not present

## 2020-06-30 DIAGNOSIS — C801 Malignant (primary) neoplasm, unspecified: Secondary | ICD-10-CM

## 2020-06-30 DIAGNOSIS — C781 Secondary malignant neoplasm of mediastinum: Secondary | ICD-10-CM | POA: Insufficient documentation

## 2020-06-30 DIAGNOSIS — E1140 Type 2 diabetes mellitus with diabetic neuropathy, unspecified: Secondary | ICD-10-CM | POA: Diagnosis not present

## 2020-06-30 DIAGNOSIS — U071 COVID-19: Secondary | ICD-10-CM

## 2020-06-30 MED ORDER — SOTROVIMAB 500 MG/8ML IV SOLN
500.0000 mg | Freq: Once | INTRAVENOUS | Status: AC
  Administered 2020-06-30: 500 mg via INTRAVENOUS

## 2020-06-30 MED ORDER — FAMOTIDINE IN NACL 20-0.9 MG/50ML-% IV SOLN: 20.0000 mg | Freq: Once | INTRAVENOUS | Status: DC | PRN

## 2020-06-30 MED ORDER — EPINEPHRINE 0.3 MG/0.3ML IJ SOAJ: 0.3000 mg | Freq: Once | INTRAMUSCULAR | Status: DC | PRN

## 2020-06-30 MED ORDER — SODIUM CHLORIDE 0.9 % IV SOLN: INTRAVENOUS | Status: DC | PRN

## 2020-06-30 MED ORDER — METHYLPREDNISOLONE SODIUM SUCC 125 MG IJ SOLR: 125.0000 mg | Freq: Once | INTRAMUSCULAR | Status: DC | PRN

## 2020-06-30 MED ORDER — DIPHENHYDRAMINE HCL 50 MG/ML IJ SOLN: 50.0000 mg | Freq: Once | INTRAMUSCULAR | Status: DC | PRN

## 2020-06-30 MED ORDER — ALBUTEROL SULFATE HFA 108 (90 BASE) MCG/ACT IN AERS: 2.0000 | INHALATION_SPRAY | Freq: Once | RESPIRATORY_TRACT | Status: DC | PRN

## 2020-06-30 NOTE — Progress Notes (Signed)
Diagnosis: COVID-19  Physician: Dr. Patrick Wright  Procedure: [facility] Infusion Clinic Med: Sotrovimab infusion - Provided patient with sotrovimab fact sheet for patients, parents, and caregivers prior to infusion.   Complications: No immediate complications noted  Discharge: Discharged home    

## 2020-06-30 NOTE — Progress Notes (Signed)
Patient reviewed Fact Sheet for Patients, Parents, and Caregivers for Emergency Use Authorization (EUA) of sotrovimab for the Treatment of Coronavirus. Patient also reviewed and is agreeable to the estimated cost of treatment. Patient is agreeable to proceed.   

## 2020-06-30 NOTE — Discharge Instructions (Signed)
10 Things You Can Do to Manage Your COVID-19 Symptoms at Home If you have possible or confirmed COVID-19: 1. Stay home from work and school. And stay away from other public places. If you must go out, avoid using any kind of public transportation, ridesharing, or taxis. 2. Monitor your symptoms carefully. If your symptoms get worse, call your healthcare provider immediately. 3. Get rest and stay hydrated. 4. If you have a medical appointment, call the healthcare provider ahead of time and tell them that you have or may have COVID-19. 5. For medical emergencies, call 911 and notify the dispatch personnel that you have or may have COVID-19. 6. Cover your cough and sneezes with a tissue or use the inside of your elbow. 7. Wash your hands often with soap and water for at least 20 seconds or clean your hands with an alcohol-based hand sanitizer that contains at least 60% alcohol. 8. As much as possible, stay in a specific room and away from other people in your home. Also, you should use a separate bathroom, if available. If you need to be around other people in or outside of the home, wear a mask. 9. Avoid sharing personal items with other people in your household, like dishes, towels, and bedding. 10. Clean all surfaces that are touched often, like counters, tabletops, and doorknobs. Use household cleaning sprays or wipes according to the label instructions. cdc.gov/coronavirus 12/24/2018 This information is not intended to replace advice given to you by your health care provider. Make sure you discuss any questions you have with your health care provider. Document Revised: 05/28/2019 Document Reviewed: 05/28/2019 Elsevier Patient Education  2020 Elsevier Inc. What types of side effects do monoclonal antibody drugs cause?  Common side effects  In general, the more common side effects caused by monoclonal antibody drugs include: . Allergic reactions, such as hives or itching . Flu-like signs and  symptoms, including chills, fatigue, fever, and muscle aches and pains . Nausea, vomiting . Diarrhea . Skin rashes . Low blood pressure   The CDC is recommending patients who receive monoclonal antibody treatments wait at least 90 days before being vaccinated.  Currently, there are no data on the safety and efficacy of mRNA COVID-19 vaccines in persons who received monoclonal antibodies or convalescent plasma as part of COVID-19 treatment. Based on the estimated half-life of such therapies as well as evidence suggesting that reinfection is uncommon in the 90 days after initial infection, vaccination should be deferred for at least 90 days, as a precautionary measure until additional information becomes available, to avoid interference of the antibody treatment with vaccine-induced immune responses. If you have any questions or concerns after the infusion please call the Advanced Practice Provider on call at [phone]. This number is ONLY intended for your use regarding questions or concerns about the infusion post-treatment side-effects.  Please do not provide this number to others for use. For return to work notes please contact your primary care provider.   If someone you know is interested in receiving treatment please have them call the COVID hotline at [phone].   

## 2020-07-06 ENCOUNTER — Ambulatory Visit: Payer: BC Managed Care – PPO

## 2020-07-06 ENCOUNTER — Other Ambulatory Visit: Payer: BC Managed Care – PPO

## 2020-07-06 ENCOUNTER — Ambulatory Visit: Payer: BC Managed Care – PPO | Admitting: Internal Medicine

## 2020-07-20 ENCOUNTER — Inpatient Hospital Stay: Payer: BC Managed Care – PPO | Attending: Internal Medicine | Admitting: Internal Medicine

## 2020-07-20 ENCOUNTER — Encounter: Payer: Self-pay | Admitting: Internal Medicine

## 2020-07-20 ENCOUNTER — Inpatient Hospital Stay: Payer: BC Managed Care – PPO

## 2020-07-20 DIAGNOSIS — Z7189 Other specified counseling: Secondary | ICD-10-CM

## 2020-07-20 DIAGNOSIS — C182 Malignant neoplasm of ascending colon: Secondary | ICD-10-CM

## 2020-07-20 DIAGNOSIS — M549 Dorsalgia, unspecified: Secondary | ICD-10-CM | POA: Insufficient documentation

## 2020-07-20 DIAGNOSIS — I10 Essential (primary) hypertension: Secondary | ICD-10-CM | POA: Insufficient documentation

## 2020-07-20 DIAGNOSIS — Z5111 Encounter for antineoplastic chemotherapy: Secondary | ICD-10-CM | POA: Insufficient documentation

## 2020-07-20 DIAGNOSIS — I739 Peripheral vascular disease, unspecified: Secondary | ICD-10-CM | POA: Diagnosis not present

## 2020-07-20 DIAGNOSIS — E119 Type 2 diabetes mellitus without complications: Secondary | ICD-10-CM | POA: Diagnosis not present

## 2020-07-20 DIAGNOSIS — F17210 Nicotine dependence, cigarettes, uncomplicated: Secondary | ICD-10-CM | POA: Insufficient documentation

## 2020-07-20 DIAGNOSIS — Z7984 Long term (current) use of oral hypoglycemic drugs: Secondary | ICD-10-CM | POA: Diagnosis not present

## 2020-07-20 DIAGNOSIS — J449 Chronic obstructive pulmonary disease, unspecified: Secondary | ICD-10-CM | POA: Insufficient documentation

## 2020-07-20 DIAGNOSIS — Z8616 Personal history of COVID-19: Secondary | ICD-10-CM | POA: Insufficient documentation

## 2020-07-20 DIAGNOSIS — C3490 Malignant neoplasm of unspecified part of unspecified bronchus or lung: Secondary | ICD-10-CM | POA: Insufficient documentation

## 2020-07-20 DIAGNOSIS — C781 Secondary malignant neoplasm of mediastinum: Secondary | ICD-10-CM

## 2020-07-20 DIAGNOSIS — C771 Secondary and unspecified malignant neoplasm of intrathoracic lymph nodes: Secondary | ICD-10-CM | POA: Insufficient documentation

## 2020-07-20 DIAGNOSIS — Z79899 Other long term (current) drug therapy: Secondary | ICD-10-CM | POA: Insufficient documentation

## 2020-07-20 DIAGNOSIS — F32A Depression, unspecified: Secondary | ICD-10-CM | POA: Diagnosis not present

## 2020-07-20 DIAGNOSIS — C787 Secondary malignant neoplasm of liver and intrahepatic bile duct: Secondary | ICD-10-CM | POA: Insufficient documentation

## 2020-07-20 DIAGNOSIS — G8929 Other chronic pain: Secondary | ICD-10-CM | POA: Insufficient documentation

## 2020-07-20 DIAGNOSIS — G629 Polyneuropathy, unspecified: Secondary | ICD-10-CM | POA: Insufficient documentation

## 2020-07-20 DIAGNOSIS — C801 Malignant (primary) neoplasm, unspecified: Secondary | ICD-10-CM

## 2020-07-20 LAB — CBC WITH DIFFERENTIAL/PLATELET
Abs Immature Granulocytes: 0.02 10*3/uL (ref 0.00–0.07)
Basophils Absolute: 0.1 10*3/uL (ref 0.0–0.1)
Basophils Relative: 1 %
Eosinophils Absolute: 0.4 10*3/uL (ref 0.0–0.5)
Eosinophils Relative: 6 %
HCT: 33.8 % — ABNORMAL LOW (ref 39.0–52.0)
Hemoglobin: 11.5 g/dL — ABNORMAL LOW (ref 13.0–17.0)
Immature Granulocytes: 0 %
Lymphocytes Relative: 28 %
Lymphs Abs: 1.9 10*3/uL (ref 0.7–4.0)
MCH: 29.3 pg (ref 26.0–34.0)
MCHC: 34 g/dL (ref 30.0–36.0)
MCV: 86 fL (ref 80.0–100.0)
Monocytes Absolute: 0.5 10*3/uL (ref 0.1–1.0)
Monocytes Relative: 8 %
Neutro Abs: 3.8 10*3/uL (ref 1.7–7.7)
Neutrophils Relative %: 57 %
Platelets: 140 10*3/uL — ABNORMAL LOW (ref 150–400)
RBC: 3.93 MIL/uL — ABNORMAL LOW (ref 4.22–5.81)
RDW: 14.5 % (ref 11.5–15.5)
WBC: 6.7 10*3/uL (ref 4.0–10.5)
nRBC: 0 % (ref 0.0–0.2)

## 2020-07-20 LAB — URINALYSIS, COMPLETE (UACMP) WITH MICROSCOPIC
Bilirubin Urine: NEGATIVE
Glucose, UA: NEGATIVE mg/dL
Hgb urine dipstick: NEGATIVE
Ketones, ur: NEGATIVE mg/dL
Leukocytes,Ua: NEGATIVE
Nitrite: NEGATIVE
Protein, ur: NEGATIVE mg/dL
Specific Gravity, Urine: 1.004 — ABNORMAL LOW (ref 1.005–1.030)
pH: 7 (ref 5.0–8.0)

## 2020-07-20 LAB — COMPREHENSIVE METABOLIC PANEL
ALT: 15 U/L (ref 0–44)
AST: 21 U/L (ref 15–41)
Albumin: 3.8 g/dL (ref 3.5–5.0)
Alkaline Phosphatase: 45 U/L (ref 38–126)
Anion gap: 9 (ref 5–15)
BUN: 22 mg/dL (ref 8–23)
CO2: 25 mmol/L (ref 22–32)
Calcium: 9.2 mg/dL (ref 8.9–10.3)
Chloride: 98 mmol/L (ref 98–111)
Creatinine, Ser: 1.03 mg/dL (ref 0.61–1.24)
GFR, Estimated: >60 mL/min (ref >60)
Glucose, Bld: 156 mg/dL — ABNORMAL HIGH (ref 70–99)
Potassium: 5 mmol/L (ref 3.5–5.1)
Sodium: 132 mmol/L — ABNORMAL LOW (ref 135–145)
Total Bilirubin: 0.5 mg/dL (ref 0.3–1.2)
Total Protein: 7.1 g/dL (ref 6.5–8.1)

## 2020-07-20 MED ORDER — SODIUM CHLORIDE 0.9 % IV SOLN
5.0000 mg/kg | Freq: Once | INTRAVENOUS | Status: AC
  Administered 2020-07-20: 350 mg via INTRAVENOUS
  Filled 2020-07-20: qty 14

## 2020-07-20 MED ORDER — SODIUM CHLORIDE 0.9 % IV SOLN: 4.0000 mg | Freq: Once | INTRAVENOUS | Status: DC

## 2020-07-20 MED ORDER — HYDROCODONE-ACETAMINOPHEN 5-325 MG PO TABS: ORAL_TABLET | ORAL | 0 refills | Status: DC

## 2020-07-20 MED ORDER — SODIUM CHLORIDE 0.9 % IV SOLN
2400.0000 mg/m2 | INTRAVENOUS | Status: DC
  Administered 2020-07-20: 4200 mg via INTRAVENOUS
  Filled 2020-07-20: qty 84

## 2020-07-20 MED ORDER — HEPARIN SOD (PORK) LOCK FLUSH 100 UNIT/ML IV SOLN
500.0000 [IU] | Freq: Once | INTRAVENOUS | Status: DC
  Filled 2020-07-20: qty 5

## 2020-07-20 MED ORDER — SODIUM CHLORIDE 0.9% FLUSH
10.0000 mL | Freq: Once | INTRAVENOUS | Status: AC
  Administered 2020-07-20: 10 mL via INTRAVENOUS
  Filled 2020-07-20: qty 10

## 2020-07-20 MED ORDER — DEXAMETHASONE SODIUM PHOSPHATE 10 MG/ML IJ SOLN
4.0000 mg | Freq: Once | INTRAMUSCULAR | Status: AC
  Administered 2020-07-20: 4 mg via INTRAVENOUS
  Filled 2020-07-20: qty 1

## 2020-07-20 MED ORDER — SODIUM CHLORIDE 0.9 % IV SOLN
Freq: Once | INTRAVENOUS | Status: AC
  Filled 2020-07-20: qty 250

## 2020-07-20 MED ORDER — SODIUM CHLORIDE 0.9 % IV SOLN
5.0000 mg/kg | Freq: Once | INTRAVENOUS | Status: DC
Start: 2020-07-20 — End: 2020-07-20

## 2020-07-20 NOTE — Assessment & Plan Note (Addendum)
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 Unchanged appearance of soft tissue about the AP window, at the site of a previously noted FDG avid lymph node. No discretely enlarged mediastinal, hilar, or axillary lymph nodes. Redemonstrated postoperative findings status post right Hemicolectomy. No significant interval change in a biopsy proven metastatic lesion of the lateral liver dome.  No evidence of new metastatic disease in the chest, abdomen, or pelvis- STABLE.   # proceed with 5FU with Avastin;  Labs today reviewed;  acceptable for treatment today.    # Anemia- secondary- sec to chemo-hemoglobin on 11-12-STABLE  # Back pain- MRI lumbar spine- July 2021-NEG for mets; severe arthritic changes; spinal canal stenosis- STABLE;  hydrocodone 1 day prn.  # Poorly controlled diabetes blood sugars-better controlled as per patients.; over all stable;  FBG-154 STABLE.   # LEFT hilar/LUL-  Limited stage small cell cancer-s/p chemo-RT; CT scan chest February 23, 2020-LUL radiation changes- STABLE;DEC 12th, 2021CT STABLE.    # HTN- 150s/ 70-on Avastin- STABLE.  continue current anti-HTNs.      # DISPOSITION:  # today 5FU + avastin;  d-3- pump de-access # follow up in 2 weeks-MD; labs-cbc/cmp/cea; UA; 5FU + avastin; D-3 pump de-access-Dr.B

## 2020-07-20 NOTE — Progress Notes (Signed)
Mr. Astarita tolerated his treatment today without any complications.

## 2020-07-20 NOTE — Progress Notes (Signed)
[place] NOTE  Patient Care Team: Laneta Simmers, NP as PCP - General (Nurse Practitioner) Telford Nab, RN as Registered Nurse Clent Jacks, RN as Oncology Nurse Navigator  CHIEF COMPLAINTS/PURPOSE OF CONSULTATION: Lung cancer/colon cancer   Oncology History Overview Note  # July 2020- SMALL CELL CA METASTATIC TO MEDIASTINAL LN [open Bx; Dr.Oaks]; TxN2M0; July 2nd 2020-PET- 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  MRI brain negative  # aug 17th 2020-carbo etoposide-RT [? 8/19]; #4 cycle carbo-Etop [finished dec 30th,2020]  #August 2020 iron deficient anemia-question etiology; IV Feraheme [no colonoscopy]  # OCT 2020- colo/ Cecal adeno ca; MRI liver- 1 cm enhancing lesion "metastasis" [Dr.Anna/Dr.Cannon]-difficulty biopsy.  November 02-2019 right hemicolectomy- STAGE III [pT3pN1 (2/13LN)]- HOLD adjuvant therapy. MMR- INTACT/LOW  #May 2021-liver biopsy-possible adenocarcinoma; colorectal origin.  Stage IV colon cancer  #May 24-2021-FOLFOX with Avastin; AUG 31st,2021- STABLE liver lesion; SEP 1st, 2021- Discontinue LV+Ox sec to severe fatigue/PN; cont 5FU CIV + Avastin  # COPD/  DM-2- on OHA/ smoker/ PVD/peripheral neuropathy  # History of alcohol abuse/quit 2014/ Hx of abdominal trauma [at 21y]  #July 2021-foundation 1 NGS [liver metastases]- RAS WILD TYPE **  # Jan 1st, 2022-Covid infection [prior vaccination; monoclonal infusion-not hospitalized]  # DIAGNOSIS:   # SMALL CELL CA-limited stage-status post chemoradiation;   # colon cancer-stage IV-solitary liver lesion  GOALS: Control  CURRENT/MOST RECENT THERAPY : FOLFOX with Avastin   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 ([place])  01/29/2019 Initial Diagnosis   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 [facility])   02/09/2019 - 06/24/2019 Chemotherapy   The patient had palonosetron (ALOXI) injection 0.25  mg, 0.25 mg, Intravenous,  Once, 4 of 4 cycles Administration: 0.25 mg (02/09/2019), 0.25 mg (03/03/2019), 0.25 mg (06/02/2019), 0.25 mg (06/22/2019) CARBOplatin (PARAPLATIN) 410 mg in sodium chloride 0.9 % 250 mL chemo infusion, 410 mg (100 % of original dose 414 mg), Intravenous,  Once, 4 of 4 cycles Dose modification:   (original dose 414 mg, Cycle 1) Administration: 410 mg (02/09/2019), 410 mg (03/03/2019), 410 mg (06/02/2019), 410 mg (06/22/2019) etoposide (VEPESID) 180 mg in sodium chloride 0.9 % 500 mL chemo infusion, 100 mg/m2 = 180 mg, Intravenous,  Once, 4 of 4 cycles Administration: 180 mg (02/09/2019), 180 mg (02/10/2019), 180 mg (02/11/2019), 180 mg (03/03/2019), 180 mg (03/04/2019), 180 mg (03/05/2019), 180 mg (06/02/2019), 180 mg (06/03/2019), 180 mg (06/04/2019), 180 mg (06/22/2019), 180 mg (06/23/2019), 180 mg (06/24/2019) fosaprepitant (EMEND) 150 mg, dexamethasone (DECADRON) 6 mg in sodium chloride 0.9 % 145 mL IVPB, , Intravenous,  Once, 2 of 2 cycles Administration:  (06/02/2019),  (06/22/2019)  for chemotherapy treatment.    Cancer of ascending colon ([place])  05/04/2019 Initial Diagnosis   Cancer of ascending colon ([place])   11/30/2019 -  Chemotherapy    Patient is on Treatment Plan: COLORECTAL FOLFOX + BEVACIZUMAB Q14D       HISTORY OF PRESENTING ILLNESS:   Kevin Mckay 70 y.o.  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  Patient's last chemotherapy was approximately 4 weeks ago; given a break because of holidays.  However on January 1 patient was diagnosed with Covid infection.  Patient was not admitted to hospital.  Given his immunosuppressed state received monoclonal antibody infusion.  Patient has since recovered well.  Patient had been vaccinated; awaiting a  booster.  Denies any new shortness of breath or cough.  Chronic tingling  and numbness extremities.  Chronic back pain for which he takes hydrocodone as needed once a day.  Review of Systems  Constitutional: Positive for malaise/fatigue and weight loss. Negative for chills, diaphoresis and fever.  HENT: Negative for nosebleeds and sore throat.   Eyes: Negative for double vision.  Respiratory: Positive for shortness of breath. Negative for hemoptysis and wheezing.   Cardiovascular: Negative for chest pain, palpitations and orthopnea.  Gastrointestinal: Negative for abdominal pain, blood in stool, constipation, diarrhea, heartburn, melena, nausea and vomiting.  Genitourinary: Negative for dysuria, frequency and urgency.  Musculoskeletal: Positive for back pain and joint pain.  Skin: Negative.  Negative for itching and rash.  Neurological: Positive for tingling. Negative for dizziness, focal weakness and weakness.  Endo/Heme/Allergies: Does not bruise/bleed easily.  Psychiatric/Behavioral: Negative for depression. The patient is not nervous/anxious and does not have insomnia.      MEDICAL HISTORY:  Past Medical History:  Diagnosis Date  . Asthma   . Cancer ([place])    Metastatic small cell lung cancer  . Cerebral aneurysm   . Chronic painful diabetic neuropathy ([place])   . Depression   . Diabetes mellitus without complication ([place])   . Essential hypertension   . History of kidney stones   . Occasional tremors   . Tobacco use     SURGICAL HISTORY: Past Surgical History:  Procedure Laterality Date  . ABDOMINAL SURGERY     age 70. trauma surgery due to forklift injury- liver and spleen  . COLONOSCOPY WITH PROPOFOL N/A 03/27/2019   Procedure: COLONOSCOPY WITH PROPOFOL;  Surgeon: Jonathon Bellows, MD;  Location: [facility] ENDOSCOPY;  Service: Gastroenterology;  Laterality: N/A;  . COLOSTOMY REVISION Right 05/04/2019   Procedure: COLON RESECTION RIGHT-right hemicolectomy-open;  Surgeon: Fredirick Maudlin, MD;  Location: [facility] ORS;  Service: General;  Laterality: Right;  .  ESOPHAGOGASTRODUODENOSCOPY (EGD) WITH PROPOFOL N/A 03/27/2019   Procedure: ESOPHAGOGASTRODUODENOSCOPY (EGD) WITH PROPOFOL;  Surgeon: Jonathon Bellows, MD;  Location[facility] END[facility]COPY;  Service: Gastroenterology;  Laterality: N/A;  . IR GENERIC HISTORICAL  05/31/2016   IR ANGIO VERTEBRAL SEL VERTEBRAL BILAT MOD SED 05/31/2016 Consuella Lose, MD [facility]-INTERV RAD  . IR GENERIC HISTORICAL  05/31/2016   IR ANGIO INTRA EXTRACRAN SEL INTERNAL CAROTID BILAT MOD SED 05/31/2016 Consuella Lose, MD [facility]INTER[facility]  . PORTACATH PLACEMENT Right 02/04/2019   Procedure: INSERTION PORT-A-CATH;  Surgeon: [name], MD;  Location: [facility] ORS;  Service: General;  Laterality: Right;  . THORACOTOMY Left 01/22/2019   Procedure: PRE OP BRONCH LEFT ANTERIOR THORACOTOMY WITH BIOPSY OF MEDIASTINAL MASS;  Surgeon: [name], MD;  Location: [facility] ORS;  Service: General;  Laterality: Left;    SOCIAL HISTORY: Social History   Socioeconomic History  . Marital status: Married    Spouse name: Not on file  . Number of children: Not on file  . Years of education: Not on file  . Highest education level: Not on file  Occupational History  . Not on file  Tobacco Use  . Smoking status: Current Every Day Smoker    Packs/day: 0.50    Years: 50.00    Pack years: 25.00    Types: Cigarettes  . Smokeless tobacco: Never Used  Vaping Use  . Vaping Use: Every day  . Substances: Nicotine  Substance and Sexual Activity  . Alcohol use: No  . Drug use: Yes    Types: Barbituates, Marijuana  . Sexual activity: Not on file  Other Topics Concern  . Not on file  Social History Narrative   Lives in [place]; wife/ son/grandson [custody]; smoker; vending business; hx of alcoholism- quit at 58.    Social Determinants of Health   Financial Resource Strain: Not on file  Food Insecurity: Not on file  Transportation Needs: Not on file  Physical Activity: Not on file  Stress: Not on file  Social Connections: Not on file  Intimate  Partner Violence: Not on file    FAMILY HISTORY: Family History  Problem Relation Age of Onset  . Lymphoma Father   . Liver cancer Paternal Uncle   . Lung cancer Paternal Uncle   . Lung cancer Maternal Uncle     ALLERGIES:  is allergic to ferumoxytol, amoxicillin, and codeine.  MEDICATIONS:  Current Outpatient Medications  Medication Sig Dispense Refill  . amLODipine (NORVASC) 10 MG tablet Take 10 mg by mouth every morning.     . budesonide-formoterol (SYMBICORT) 160-4.5 MCG/ACT inhaler Inhale 2 puffs into the lungs 2 (two) times daily.    . DULoxetine (CYMBALTA) 30 MG capsule Take 30 mg by mouth every morning.     . gabapentin (NEURONTIN) 800 MG tablet Take 800 mg by mouth 3 (three) times daily.    . insulin glargine (LANTUS) 100 UNIT/ML injection Inject into the skin daily. Units based By sliding scale    . Ipratropium-Albuterol (COMBIVENT) 20-100 MCG/ACT AERS respimat Inhale into the lungs.    . lidocaine-prilocaine (EMLA) cream Apply 1 application topically as needed. 30-45 mins prior to port access. 30 g 0  . linaclotide (LINZESS) 145 MCG CAPS capsule Take 145 mcg by mouth daily before breakfast.    . lisinopril-hydrochlorothiazide (ZESTORETIC) 10-12.5 MG tablet Take 1 tablet by mouth daily.    Marland Kitchen lovastatin (MEVACOR) 20 MG tablet Take 20 mg by mouth every evening.    . metFORMIN (GLUCOPHAGE) 1000 MG tablet Take 1,000 mg by mouth 2 (two) times daily with a meal.     . metFORMIN (GLUCOPHAGE-XR) 500 MG 24 hr tablet Take 1,000 mg by mouth 2 (two) times daily.    Marland Kitchen omeprazole (PRILOSEC) 40 MG capsule Take 1 capsule (40 mg total) by mouth daily. 90 capsule 1  . ondansetron (ZOFRAN) 8 MG tablet Take 1 tablet (8 mg total) by mouth every 8 (eight) hours as needed for nausea or vomiting (start 3 days; after chemo). 40 tablet 1  . polyethylene glycol powder (MIRALAX) 17 GM/SCOOP powder Mix full container in 64 ounces of Gatorade or other clear liquid. NO RED Liquids 238 g 0  . prazosin  (MINIPRESS) 1 MG capsule Take 1 mg by mouth at bedtime.    . pyridOXINE (VITAMIN B-6) 100 MG tablet Take 100 mg by mouth daily.    . sitaGLIPtin (JANUVIA) 25 MG tablet Take 25 mg by mouth daily.    . vitamin B-12 (CYANOCOBALAMIN) 100 MCG tablet Take 100 mcg by mouth daily.    Marland Kitchen HYDROcodone-acetaminophen (NORCO/VICODIN) 5-325 MG tablet Tale one tablet a day as needed for pain. 45 tablet 0   No current facility-administered medications for this visit.   Facility-Administered Medications Ordered in Other Visits  Medication Dose Route Frequency Provider Last Rate Last Admin  . fluorouracil (ADRUCIL) 4,200 mg in sodium chloride 0.9 % 66 mL chemo infusion  2,400 mg/m2 (Treatment Plan Recorded) Intravenous 1 day or 1 dose Charlaine Dalton R, MD   4,200 mg at 07/20/20 1112  . heparin lock flush 100 unit/mL  500 Units Intravenous Once [place]  R, MD          .  PHYSICAL EXAMINATION: ECOG PERFORMANCE STATUS: 0 - Asymptomatic  Vitals:   07/20/20 0925  BP: (!) 149/77  Pulse: 65  Resp: 16  Temp: (!) 96.6 F (35.9 C)  SpO2: 100%   Filed Weights   07/20/20 0925  Weight: 157 lb (71.2 kg)    Physical Exam HENT:     Head: Normocephalic and atraumatic.     Mouth/Throat:     Pharynx: No oropharyngeal exudate.  Eyes:     Pupils: Pupils are equal, round, and reactive to light.  Cardiovascular:     Rate and Rhythm: Normal rate and regular rhythm.  Pulmonary:     Effort: No respiratory distress.     Breath sounds: No wheezing.  Abdominal:     General: Bowel sounds are normal. There is no distension.     Palpations: Abdomen is soft. There is no mass.     Tenderness: There is no abdominal tenderness. There is no guarding or rebound.     Comments: Abdominal incision well-healed.  Musculoskeletal:        General: No tenderness. Normal range of motion.     Cervical back: Normal range of motion and neck supple.  Skin:    General: Skin is warm.  Neurological:     Mental  Status: He is alert and oriented to person, place, and time.  Psychiatric:        Mood and Affect: Affect normal.     LABORATORY DATA:  I have reviewed the data as listed Lab Results  Component Value Date   WBC 6.7 07/20/2020   HGB 11.5 (L) 07/20/2020   HCT 33.8 (L) 07/20/2020   MCV 86.0 07/20/2020   PLT 140 (L) 07/20/2020   Recent Labs    02/24/20 0819 03/09/20 0813 03/23/20 0850 04/06/20 0813 05/25/20 0807 06/08/20 0825 07/20/20 0850  NA 134* 132* 136   < > 138 136 132*  K 4.1 3.9 3.6   < > 4.3 4.7 5.0  CL 98 97* 100   < > 102 100 98  CO2 '26 25 25   ' < > '28 25 25  ' GLUCOSE 196* 260* 170*   < > 129* 142* 156*  BUN 33* 16 17   < > '20 23 22  ' CREATININE 0.96 0.96 0.97   < > 1.28* 1.27* 1.03  CALCIUM 8.6* 8.6* 9.1   < > 9.4 8.8* 9.2  GFRNONAA >60 >60 >60   < > >60 >60 >60  GFRAA >60 >60 >60  --   --   --   --   PROT 6.8 6.8 6.9   < > 7.5 6.7 7.1  ALBUMIN 3.6 3.6 3.8   < > 3.9 3.8 3.8  AST '20 26 20   ' < > '20 20 21  ' ALT '17 19 18   ' < > '17 17 15  ' ALKPHOS 59 64 59   < > 56 54 45  BILITOT 0.3 0.6 0.6   < > 0.7 0.5 0.5   < > = values in this interval not displayed.    RADIOGRAPHIC STUDIES: I have personally reviewed the radiological images as listed and agreed with the findings in the report. No results found.  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 Unchanged appearance of soft tissue about the AP window, at the site of a previously noted FDG avid lymph node. No discretely enlarged mediastinal, hilar, or axillary lymph nodes. Redemonstrated postoperative findings status post right Hemicolectomy. No significant interval change in a biopsy proven metastatic lesion of the lateral liver dome.  No evidence of new metastatic disease in  the chest, abdomen, or pelvis- STABLE.   # proceed with 5FU with Avastin;  Labs today reviewed;  acceptable for treatment today.    # Anemia- secondary- sec to chemo-hemoglobin on 11-12-STABLE  # Back pain- MRI lumbar spine- July 2021-NEG for mets; severe arthritic changes; spinal canal stenosis- STABLE;  hydrocodone 1 day prn.  # Poorly controlled diabetes blood sugars-better controlled as per patients.; over all stable;  FBG-154 STABLE.   # LEFT hilar/LUL-  Limited stage small cell cancer-s/p chemo-RT; CT scan chest February 23, 2020-LUL radiation changes- STABLE;DEC 12th, 2021CT STABLE.    # HTN- 150s/ 70-on Avastin- STABLE.  continue current anti-HTNs.      # DISPOSITION:  # today 5FU + avastin;  d-3- pump de-access # follow up in 2 weeks-MD; labs-cbc/cmp/cea; UA; 5FU + avastin; D-3 pump de-access-Dr.B     All questions were answered. The patient knows to call the clinic with any problems, questions or concerns.    Cammie Sickle, MD 07/20/2020 1:07 PM

## 2020-07-21 LAB — CEA: CEA: 3.3 ng/mL (ref 0.0–4.7)

## 2020-07-22 ENCOUNTER — Other Ambulatory Visit: Payer: Self-pay

## 2020-07-22 ENCOUNTER — Inpatient Hospital Stay: Payer: BC Managed Care – PPO

## 2020-07-22 DIAGNOSIS — Z5111 Encounter for antineoplastic chemotherapy: Secondary | ICD-10-CM | POA: Diagnosis not present

## 2020-07-22 DIAGNOSIS — Z95828 Presence of other vascular implants and grafts: Secondary | ICD-10-CM

## 2020-07-22 MED ORDER — SODIUM CHLORIDE 0.9% FLUSH
10.0000 mL | Freq: Once | INTRAVENOUS | Status: AC
  Administered 2020-07-22: 10 mL via INTRAVENOUS
  Filled 2020-07-22: qty 10

## 2020-07-22 MED ORDER — HEPARIN SOD (PORK) LOCK FLUSH 100 UNIT/ML IV SOLN
500.0000 [IU] | Freq: Once | INTRAVENOUS | Status: AC
  Administered 2020-07-22: 500 [IU] via INTRAVENOUS
  Filled 2020-07-22: qty 5

## 2020-08-03 ENCOUNTER — Encounter: Payer: Self-pay | Admitting: Internal Medicine

## 2020-08-03 ENCOUNTER — Other Ambulatory Visit: Payer: Self-pay

## 2020-08-03 ENCOUNTER — Inpatient Hospital Stay: Payer: BC Managed Care – PPO | Admitting: Internal Medicine

## 2020-08-03 ENCOUNTER — Inpatient Hospital Stay: Payer: BC Managed Care – PPO

## 2020-08-03 ENCOUNTER — Inpatient Hospital Stay: Payer: BC Managed Care – PPO | Attending: Internal Medicine

## 2020-08-03 DIAGNOSIS — Z5111 Encounter for antineoplastic chemotherapy: Secondary | ICD-10-CM | POA: Insufficient documentation

## 2020-08-03 DIAGNOSIS — I10 Essential (primary) hypertension: Secondary | ICD-10-CM | POA: Diagnosis not present

## 2020-08-03 DIAGNOSIS — M545 Low back pain, unspecified: Secondary | ICD-10-CM | POA: Insufficient documentation

## 2020-08-03 DIAGNOSIS — Z79899 Other long term (current) drug therapy: Secondary | ICD-10-CM | POA: Insufficient documentation

## 2020-08-03 DIAGNOSIS — Z7189 Other specified counseling: Secondary | ICD-10-CM

## 2020-08-03 DIAGNOSIS — C801 Malignant (primary) neoplasm, unspecified: Secondary | ICD-10-CM

## 2020-08-03 DIAGNOSIS — Z7984 Long term (current) use of oral hypoglycemic drugs: Secondary | ICD-10-CM | POA: Insufficient documentation

## 2020-08-03 DIAGNOSIS — R5383 Other fatigue: Secondary | ICD-10-CM | POA: Diagnosis not present

## 2020-08-03 DIAGNOSIS — C3412 Malignant neoplasm of upper lobe, left bronchus or lung: Secondary | ICD-10-CM | POA: Diagnosis not present

## 2020-08-03 DIAGNOSIS — G8929 Other chronic pain: Secondary | ICD-10-CM | POA: Insufficient documentation

## 2020-08-03 DIAGNOSIS — F17210 Nicotine dependence, cigarettes, uncomplicated: Secondary | ICD-10-CM | POA: Insufficient documentation

## 2020-08-03 DIAGNOSIS — E1142 Type 2 diabetes mellitus with diabetic polyneuropathy: Secondary | ICD-10-CM | POA: Insufficient documentation

## 2020-08-03 DIAGNOSIS — D6481 Anemia due to antineoplastic chemotherapy: Secondary | ICD-10-CM | POA: Insufficient documentation

## 2020-08-03 DIAGNOSIS — C787 Secondary malignant neoplasm of liver and intrahepatic bile duct: Secondary | ICD-10-CM | POA: Insufficient documentation

## 2020-08-03 DIAGNOSIS — R634 Abnormal weight loss: Secondary | ICD-10-CM | POA: Diagnosis not present

## 2020-08-03 DIAGNOSIS — I671 Cerebral aneurysm, nonruptured: Secondary | ICD-10-CM | POA: Diagnosis not present

## 2020-08-03 DIAGNOSIS — M48061 Spinal stenosis, lumbar region without neurogenic claudication: Secondary | ICD-10-CM | POA: Diagnosis not present

## 2020-08-03 DIAGNOSIS — C182 Malignant neoplasm of ascending colon: Secondary | ICD-10-CM | POA: Diagnosis not present

## 2020-08-03 DIAGNOSIS — C781 Secondary malignant neoplasm of mediastinum: Secondary | ICD-10-CM

## 2020-08-03 DIAGNOSIS — F32A Depression, unspecified: Secondary | ICD-10-CM | POA: Diagnosis not present

## 2020-08-03 DIAGNOSIS — Z794 Long term (current) use of insulin: Secondary | ICD-10-CM | POA: Diagnosis not present

## 2020-08-03 DIAGNOSIS — M47816 Spondylosis without myelopathy or radiculopathy, lumbar region: Secondary | ICD-10-CM | POA: Insufficient documentation

## 2020-08-03 LAB — CBC WITH DIFFERENTIAL/PLATELET
Abs Immature Granulocytes: 0.01 10*3/uL (ref 0.00–0.07)
Basophils Absolute: 0 10*3/uL (ref 0.0–0.1)
Basophils Relative: 1 %
Eosinophils Absolute: 0.5 10*3/uL (ref 0.0–0.5)
Eosinophils Relative: 7 %
HCT: 33 % — ABNORMAL LOW (ref 39.0–52.0)
Hemoglobin: 11.3 g/dL — ABNORMAL LOW (ref 13.0–17.0)
Immature Granulocytes: 0 %
Lymphocytes Relative: 27 %
Lymphs Abs: 1.8 10*3/uL (ref 0.7–4.0)
MCH: 29.5 pg (ref 26.0–34.0)
MCHC: 34.2 g/dL (ref 30.0–36.0)
MCV: 86.2 fL (ref 80.0–100.0)
Monocytes Absolute: 0.6 10*3/uL (ref 0.1–1.0)
Monocytes Relative: 9 %
Neutro Abs: 3.7 10*3/uL (ref 1.7–7.7)
Neutrophils Relative %: 56 %
Platelets: 162 10*3/uL (ref 150–400)
RBC: 3.83 MIL/uL — ABNORMAL LOW (ref 4.22–5.81)
RDW: 14.9 % (ref 11.5–15.5)
WBC: 6.6 10*3/uL (ref 4.0–10.5)
nRBC: 0 % (ref 0.0–0.2)

## 2020-08-03 LAB — URINALYSIS, COMPLETE (UACMP) WITH MICROSCOPIC
Bacteria, UA: NONE SEEN
Bilirubin Urine: NEGATIVE
Glucose, UA: 50 mg/dL — AB
Hgb urine dipstick: NEGATIVE
Ketones, ur: NEGATIVE mg/dL
Leukocytes,Ua: NEGATIVE
Nitrite: NEGATIVE
Protein, ur: NEGATIVE mg/dL
Specific Gravity, Urine: 1.004 — ABNORMAL LOW (ref 1.005–1.030)
Squamous Epithelial / HPF: NONE SEEN (ref 0–5)
pH: 8 (ref 5.0–8.0)

## 2020-08-03 LAB — COMPREHENSIVE METABOLIC PANEL
ALT: 17 U/L (ref 0–44)
AST: 21 U/L (ref 15–41)
Albumin: 4 g/dL (ref 3.5–5.0)
Alkaline Phosphatase: 54 U/L (ref 38–126)
Anion gap: 10 (ref 5–15)
BUN: 16 mg/dL (ref 8–23)
CO2: 25 mmol/L (ref 22–32)
Calcium: 9 mg/dL (ref 8.9–10.3)
Chloride: 97 mmol/L — ABNORMAL LOW (ref 98–111)
Creatinine, Ser: 1.16 mg/dL (ref 0.61–1.24)
GFR, Estimated: >60 mL/min (ref >60)
Glucose, Bld: 187 mg/dL — ABNORMAL HIGH (ref 70–99)
Potassium: 4.8 mmol/L (ref 3.5–5.1)
Sodium: 132 mmol/L — ABNORMAL LOW (ref 135–145)
Total Bilirubin: 0.6 mg/dL (ref 0.3–1.2)
Total Protein: 7 g/dL (ref 6.5–8.1)

## 2020-08-03 MED ORDER — BEVACIZUMAB-BVZR CHEMO INJECTION 400 MG/16ML
350.0000 mg | Freq: Once | INTRAVENOUS | Status: AC
  Administered 2020-08-03: 350 mg via INTRAVENOUS
  Filled 2020-08-03: qty 14

## 2020-08-03 MED ORDER — SODIUM CHLORIDE 0.9% FLUSH
10.0000 mL | Freq: Once | INTRAVENOUS | Status: AC
  Administered 2020-08-03: 10 mL via INTRAVENOUS
  Filled 2020-08-03: qty 10

## 2020-08-03 MED ORDER — LIDOCAINE-PRILOCAINE 2.5-2.5 % EX CREA: 1.0000 "application " | TOPICAL_CREAM | CUTANEOUS | 0 refills | Status: DC | PRN

## 2020-08-03 MED ORDER — DEXTROSE 5 % IV SOLN
Freq: Once | INTRAVENOUS | Status: DC
  Filled 2020-08-03: qty 250

## 2020-08-03 MED ORDER — DEXAMETHASONE SODIUM PHOSPHATE 10 MG/ML IJ SOLN
4.0000 mg | Freq: Once | INTRAMUSCULAR | Status: AC
  Administered 2020-08-03: 4 mg via INTRAVENOUS
  Filled 2020-08-03: qty 1

## 2020-08-03 MED ORDER — SODIUM CHLORIDE 0.9 % IV SOLN
2400.0000 mg/m2 | INTRAVENOUS | Status: DC
  Administered 2020-08-03: 4200 mg via INTRAVENOUS
  Filled 2020-08-03: qty 84

## 2020-08-03 MED ORDER — SODIUM CHLORIDE 0.9 % IV SOLN: 4.0000 mg | Freq: Once | INTRAVENOUS | Status: DC

## 2020-08-03 MED ORDER — SODIUM CHLORIDE 0.9 % IV SOLN
Freq: Once | INTRAVENOUS | Status: AC
  Filled 2020-08-03: qty 250

## 2020-08-03 NOTE — Progress Notes (Signed)
Patient has had covid vaccine x 2. He has not had his booster injection due to getting covid-19 virus. He will bring his injection card with him to the next apt

## 2020-08-03 NOTE — Progress Notes (Signed)
Pt received bev infusion in clinic today. Folfox pump connected at d/c. Pt tolerated tx well.

## 2020-08-03 NOTE — Assessment & Plan Note (Addendum)
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  No evidence of new metastatic disease in the chest, abdomen, or pelvis-  STABLE.   # proceed with 5FU with Avastin;  Labs today reviewed;  acceptable for treatment today.    # Anemia- secondary- sec to chemo-hemoglobin on 11-12-STABLE  # Back pain- MRI lumbar spine- July 2021-NEG for mets; severe arthritic changes; spinal canal stenosis- STABLE;  hydrocodone 1 day prn.  # Poorly controlled diabetes blood sugars-better controlled as per patients.; over all stable;  FBG-154 STABLE.   # LEFT hilar/LUL-  Limited stage small cell cancer-s/p chemo-RT; CT scan chest February 23, 2020-LUL radiation changes- STABLE;DEC 12th, 2021CT STABLE.    # HTN- 140s/ 70-on Avastin- STABLE.  continue current anti-HTNs.   # DISPOSITION:  # today 5FU + avastin;  d-3- pump de-access # follow up in 2 weeks-MD; labs-cbc/cmp/cea; UA; 5FU + avastin; D-3 pump de-access-Dr.B

## 2020-08-03 NOTE — Progress Notes (Signed)
[place] NOTE  Patient Care Team: Laneta Simmers, NP as PCP - General (Nurse Practitioner) Telford Nab, RN as Registered Nurse Clent Jacks, RN as Oncology Nurse Navigator  CHIEF COMPLAINTS/PURPOSE OF CONSULTATION: Lung cancer/colon cancer   Oncology History Overview Note  # July 2020- SMALL CELL CA METASTATIC TO MEDIASTINAL LN [open Bx; Dr.Oaks]; TxN2M0; July 2nd 2020-PET- 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  MRI brain negative  # aug 17th 2020-carbo etoposide-RT [? 8/19]; #4 cycle carbo-Etop [finished dec 30th,2020]  #August 2020 iron deficient anemia-question etiology; IV Feraheme [no colonoscopy]  # OCT 2020- colo/ Cecal adeno ca; MRI liver- 1 cm enhancing lesion "metastasis" [Dr.Anna/Dr.Cannon]-difficulty biopsy.  November 02-2019 right hemicolectomy- STAGE III [pT3pN1 (2/13LN)]- HOLD adjuvant therapy. MMR- INTACT/LOW  #May 2021-liver biopsy-possible adenocarcinoma; colorectal origin.  Stage IV colon cancer  #May 24-2021-FOLFOX with Avastin; AUG 31st,2021- STABLE liver lesion; SEP 1st, 2021- Discontinue LV+Ox sec to severe fatigue/PN; cont 5FU CIV + Avastin  # COPD/  DM-2- on OHA/ smoker/ PVD/peripheral neuropathy  # History of alcohol abuse/quit 2014/ Hx of abdominal trauma [at 21y]  #July 2021-foundation 1 NGS [liver metastases]- RAS WILD TYPE **  # Jan 1st, 2022-Covid infection [prior vaccination; monoclonal infusion-not hospitalized]  # DIAGNOSIS:   # SMALL CELL CA-limited stage-status post chemoradiation;   # colon cancer-stage IV-solitary liver lesion  GOALS: Control  CURRENT/MOST RECENT THERAPY : FOLFOX with Avastin   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 ([place])  01/29/2019 Initial Diagnosis   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 [facility])   02/09/2019 - 06/24/2019 Chemotherapy   The patient had palonosetron (ALOXI) injection 0.25  mg, 0.25 mg, Intravenous,  Once, 4 of 4 cycles Administration: 0.25 mg (02/09/2019), 0.25 mg (03/03/2019), 0.25 mg (06/02/2019), 0.25 mg (06/22/2019) CARBOplatin (PARAPLATIN) 410 mg in sodium chloride 0.9 % 250 mL chemo infusion, 410 mg (100 % of original dose 414 mg), Intravenous,  Once, 4 of 4 cycles Dose modification:   (original dose 414 mg, Cycle 1) Administration: 410 mg (02/09/2019), 410 mg (03/03/2019), 410 mg (06/02/2019), 410 mg (06/22/2019) etoposide (VEPESID) 180 mg in sodium chloride 0.9 % 500 mL chemo infusion, 100 mg/m2 = 180 mg, Intravenous,  Once, 4 of 4 cycles Administration: 180 mg (02/09/2019), 180 mg (02/10/2019), 180 mg (02/11/2019), 180 mg (03/03/2019), 180 mg (03/04/2019), 180 mg (03/05/2019), 180 mg (06/02/2019), 180 mg (06/03/2019), 180 mg (06/04/2019), 180 mg (06/22/2019), 180 mg (06/23/2019), 180 mg (06/24/2019) fosaprepitant (EMEND) 150 mg, dexamethasone (DECADRON) 6 mg in sodium chloride 0.9 % 145 mL IVPB, , Intravenous,  Once, 2 of 2 cycles Administration:  (06/02/2019),  (06/22/2019)  for chemotherapy treatment.    Cancer of ascending colon ([place])  05/04/2019 Initial Diagnosis   Cancer of ascending colon ([place])   11/30/2019 -  Chemotherapy    Patient is on Treatment Plan: COLORECTAL FOLFOX + BEVACIZUMAB Q14D       HISTORY OF PRESENTING ILLNESS:   Kevin Mckay 70 y.o.  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  Patient denies any shortness of breath or cough.  Chronic tingling numbness.  Chronic back pain for which he takes hydrocodone once a day.  Review of Systems  Constitutional: Positive for malaise/fatigue and weight loss. Negative for chills, diaphoresis and fever.  HENT: Negative for nosebleeds and sore throat.   Eyes: Negative  for double vision.  Respiratory: Positive for shortness of breath. Negative for hemoptysis  and wheezing.   Cardiovascular: Negative for chest pain, palpitations and orthopnea.  Gastrointestinal: Negative for abdominal pain, blood in stool, constipation, diarrhea, heartburn, melena, nausea and vomiting.  Genitourinary: Negative for dysuria, frequency and urgency.  Musculoskeletal: Positive for back pain and joint pain.  Skin: Negative.  Negative for itching and rash.  Neurological: Positive for tingling. Negative for dizziness, focal weakness and weakness.  Endo/Heme/Allergies: Does not bruise/bleed easily.  Psychiatric/Behavioral: Negative for depression. The patient is not nervous/anxious and does not have insomnia.      MEDICAL HISTORY:  Past Medical History:  Diagnosis Date  . Asthma   . Cancer ([place])    Metastatic small cell lung cancer  . Cerebral aneurysm   . Chronic painful diabetic neuropathy ([place])   . Depression   . Diabetes mellitus without complication ([place])   . Essential hypertension   . History of kidney stones   . Occasional tremors   . Tobacco use     SURGICAL HISTORY: Past Surgical History:  Procedure Laterality Date  . ABDOMINAL SURGERY     age 27. trauma surgery due to forklift injury- liver and spleen  . COLONOSCOPY WITH PROPOFOL N/A 03/27/2019   Procedure: COLONOSCOPY WITH PROPOFOL;  Surgeon: Jonathon Bellows, MD;  Location: [facility] ENDOSCOPY;  Service: Gastroenterology;  Laterality: N/A;  . COLOSTOMY REVISION Right 05/04/2019   Procedure: COLON RESECTION RIGHT-right hemicolectomy-open;  Surgeon: Fredirick Maudlin, MD;  Location: [facility] ORS;  Service: General;  Laterality: Right;  . ESOPHAGOGASTRODUODENOSCOPY (EGD) WITH PROPOFOL N/A 03/27/2019   Procedure: ESOPHAGOGASTRODUODENOSCOPY (EGD) WITH PROPOFOL;  Surgeon: Jonathon Bellows, MD;  Location: [facility] ENDOSCOPY;  Service: Gastroenterology;  Laterality: N/A;  . IR GENERIC HISTORICAL  05/31/2016   IR ANGIO VERTEBRAL SEL VERTEBRAL BILAT MOD SED 05/31/2016 Consuella Lose, MD [facility]-INTERV RAD  . IR GENERIC HISTORICAL   05/31/2016   IR ANGIO INTRA EXTRACRAN SEL INTERNAL CAROTID BILAT MOD SED 05/31/2016 Consuella Lose, MD [facility]INTER[facility]  . PORTACATH PLACEMENT Right 02/04/2019   Procedure: INSERTION PORT-A-CATH;  Surgeon: [name], MD;  Location: [facility] ORS;  Service: General;  Laterality: Right;  . THORACOTOMY Left 01/22/2019   Procedure: PRE OP BRONCH LEFT ANTERIOR THORACOTOMY WITH BIOPSY OF MEDIASTINAL MASS;  Surgeon: [name], MD;  Location: [facility] ORS;  Service: General;  Laterality: Left;    SOCIAL HISTORY: Social History   Socioeconomic History  . Marital status: Married    Spouse name: Not on file  . Number of children: Not on file  . Years of education: Not on file  . Highest education level: Not on file  Occupational History  . Not on file  Tobacco Use  . Smoking status: Current Every Day Smoker    Packs/day: 0.50    Years: 50.00    Pack years: 25.00    Types: Cigarettes  . Smokeless tobacco: Never Used  Vaping Use  . Vaping Use: Every day  . Substances: Nicotine  Substance and Sexual Activity  . Alcohol use: No  . Drug use: Yes    Types: Barbituates, Marijuana  . Sexual activity: Not on file  Other Topics Concern  . Not on file  Social History Narrative   Lives in [place]; wife/ son/grandson [custody]; smoker; vending business; hx of alcoholism- quit at 86.    Social Determinants of Health   Financial Resource Strain: Not on file  Food Insecurity: Not on file  Transportation Needs: Not on file  Physical Activity: Not  on file  Stress: Not on file  Social Connections: Not on file  Intimate Partner Violence: Not on file    FAMILY HISTORY: Family History  Problem Relation Age of Onset  . Lymphoma Father   . Liver cancer Paternal Uncle   . Lung cancer Paternal Uncle   . Lung cancer Maternal Uncle     ALLERGIES:  is allergic to ferumoxytol, amoxicillin, and codeine.  MEDICATIONS:  Current Outpatient Medications  Medication Sig Dispense Refill  . amLODipine  (NORVASC) 10 MG tablet Take 10 mg by mouth every morning.     . budesonide-formoterol (SYMBICORT) 160-4.5 MCG/ACT inhaler Inhale 2 puffs into the lungs 2 (two) times daily.    . DULoxetine (CYMBALTA) 30 MG capsule Take 30 mg by mouth every morning.     . gabapentin (NEURONTIN) 800 MG tablet Take 800 mg by mouth 3 (three) times daily.    Marland Kitchen HYDROcodone-acetaminophen (NORCO/VICODIN) 5-325 MG tablet Tale one tablet a day as needed for pain. 45 tablet 0  . insulin glargine (LANTUS) 100 UNIT/ML injection Inject into the skin daily. Units based By sliding scale    . linaclotide (LINZESS) 145 MCG CAPS capsule Take 145 mcg by mouth daily before breakfast.    . lisinopril-hydrochlorothiazide (ZESTORETIC) 10-12.5 MG tablet Take 1 tablet by mouth daily.    Marland Kitchen lovastatin (MEVACOR) 20 MG tablet Take 20 mg by mouth every evening.    . metFORMIN (GLUCOPHAGE) 1000 MG tablet Take 1,000 mg by mouth 2 (two) times daily with a meal.     . metFORMIN (GLUCOPHAGE-XR) 500 MG 24 hr tablet Take 1,000 mg by mouth 2 (two) times daily.    Marland Kitchen omeprazole (PRILOSEC) 40 MG capsule Take 1 capsule (40 mg total) by mouth daily. 90 capsule 1  . ondansetron (ZOFRAN) 8 MG tablet Take 1 tablet (8 mg total) by mouth every 8 (eight) hours as needed for nausea or vomiting (start 3 days; after chemo). 40 tablet 1  . polyethylene glycol powder (MIRALAX) 17 GM/SCOOP powder Mix full container in 64 ounces of Gatorade or other clear liquid. NO RED Liquids 238 g 0  . prazosin (MINIPRESS) 1 MG capsule Take 1 mg by mouth at bedtime.    . pyridOXINE (VITAMIN B-6) 100 MG tablet Take 100 mg by mouth daily.    . sitaGLIPtin (JANUVIA) 25 MG tablet Take 25 mg by mouth daily.    . vitamin B-12 (CYANOCOBALAMIN) 100 MCG tablet Take 100 mcg by mouth daily.    . Ipratropium-Albuterol (COMBIVENT) 20-100 MCG/ACT AERS respimat Inhale into the lungs.    . lidocaine-prilocaine (EMLA) cream Apply 1 application topically as needed. 30-45 mins prior to port access. 30  g 0   No current facility-administered medications for this visit.   Facility-Administered Medications Ordered in Other Visits  Medication Dose Route Frequency Provider Last Rate Last Admin  . bevacizumab-bvzr (ZIRABEV) 350 mg in sodium chloride 0.9 % 100 mL chemo infusion  350 mg Intravenous Once Charlaine Dalton R, MD      . dextrose 5 % solution   Intravenous Once Charlaine Dalton R, MD      . fluorouracil (ADRUCIL) 4,200 mg in sodium chloride 0.9 % 66 mL chemo infusion  2,400 mg/m2 (Treatment Plan Recorded) Intravenous 1 day or 1 dose Cammie Sickle, MD          .  PHYSICAL EXAMINATION: ECOG PERFORMANCE STATUS: 0 - Asymptomatic  Vitals:   08/03/20 0845  BP: (!) 146/70  Pulse: 68  Resp: 18  Temp: 98.1 F (36.7 C)   Filed Weights   08/03/20 0845  Weight: 158 lb (71.7 kg)    Physical Exam HENT:     Head: Normocephalic and atraumatic.     Mouth/Throat:     Pharynx: No oropharyngeal exudate.  Eyes:     Pupils: Pupils are equal, round, and reactive to light.  Cardiovascular:     Rate and Rhythm: Normal rate and regular rhythm.  Pulmonary:     Effort: No respiratory distress.     Breath sounds: No wheezing.  Abdominal:     General: Bowel sounds are normal. There is no distension.     Palpations: Abdomen is soft. There is no mass.     Tenderness: There is no abdominal tenderness. There is no guarding or rebound.     Comments: Abdominal incision well-healed.  Musculoskeletal:        General: No tenderness. Normal range of motion.     Cervical back: Normal range of motion and neck supple.  Skin:    General: Skin is warm.  Neurological:     Mental Status: He is alert and oriented to person, place, and time.  Psychiatric:        Mood and Affect: Affect normal.     LABORATORY DATA:  I have reviewed the data as listed Lab Results  Component Value Date   WBC 6.6 08/03/2020   HGB 11.3 (L) 08/03/2020   HCT 33.0 (L) 08/03/2020   MCV 86.2 08/03/2020    PLT 162 08/03/2020   Recent Labs    02/24/20 0819 03/09/20 0813 03/23/20 0850 04/06/20 0813 06/08/20 0825 07/20/20 0850 08/03/20 0822  NA 134* 132* 136   < > 136 132* 132*  K 4.1 3.9 3.6   < > 4.7 5.0 4.8  CL 98 97* 100   < > 100 98 97*  CO2 _0 < > _1 GLUCOSE 196* 260* 170*   < > 142* 156* 187*  BUN 33* 16 17   < > _2 CREATININE 0.96 0.96 0.97   < > 1.27* 1.03 1.16  CALCIUM 8.6* 8.6* 9.1   < > 8.8* 9.2 9.0  GFRNONAA >60 >60 >60   < > >60 >60 >60  GFRAA >60 >60 >60  --   --   --   --   PROT 6.8 6.8 6.9   < > 6.7 7.1 7.0  ALBUMIN 3.6 3.6 3.8   < > 3.8 3.8 4.0  AST _3 < > _4 ALT _5 < > _6 ALKPHOS 59 64 59   < > 54 45 54  BILITOT 0.3 0.6 0.6   < > 0.5 0.5 0.6   < > = values in this interval not displayed.    RADIOGRAPHIC STUDIES: I have personally reviewed the radiological images as listed and agreed with the findings in the report. No results found.  ASSESSMENT & PLAN:   Cancer of ascending colon ([place]) #Right colon cancer stage IV- liver metastases; adenocarcinoma; on 5FU + avastin; CT DEC 12th, 2021-  Unchanged post treatment appearance of a suprahilar mass of the left upper lobe STABLE-metastatic lesion of the lateral liver dome.  No evidence of new metastatic disease in the chest, abdomen, or pelvis-  STABLE.   # proceed with 5FU with Avastin;  Labs today reviewed;  acceptable for treatment today.    #  Anemia- secondary- sec to chemo-hemoglobin on 11-12-STABLE  # Back pain- MRI lumbar spine- July 2021-NEG for mets; severe arthritic changes; spinal canal stenosis- STABLE;  hydrocodone 1 day prn.  # Poorly controlled diabetes blood sugars-better controlled as per patients.; over all stable;  FBG-154 STABLE.   # LEFT hilar/LUL-  Limited stage small cell cancer-s/p chemo-RT; CT scan chest February 23, 2020-LUL radiation changes- STABLE;DEC 12th, 2021CT STABLE.    # HTN- 140s/ 70-on Avastin- STABLE.  continue current  anti-HTNs.   # DISPOSITION:  # today 5FU + avastin;  d-3- pump de-access # follow up in 2 weeks-MD; labs-cbc/cmp/cea; UA; 5FU + avastin; D-3 pump de-access-Dr.B     All questions were answered. The patient knows to call the clinic with any problems, questions or concerns.    Cammie Sickle, MD 08/03/2020 9:35 AM

## 2020-08-04 LAB — CEA: CEA: 3.7 ng/mL (ref 0.0–4.7)

## 2020-08-05 ENCOUNTER — Inpatient Hospital Stay: Payer: BC Managed Care – PPO

## 2020-08-05 VITALS — BP 136/85 | HR 70 | Temp 98.0°F | Resp 18

## 2020-08-05 DIAGNOSIS — C182 Malignant neoplasm of ascending colon: Secondary | ICD-10-CM

## 2020-08-05 DIAGNOSIS — Z5111 Encounter for antineoplastic chemotherapy: Secondary | ICD-10-CM | POA: Diagnosis not present

## 2020-08-05 DIAGNOSIS — Z7189 Other specified counseling: Secondary | ICD-10-CM

## 2020-08-05 MED ORDER — SODIUM CHLORIDE 0.9% FLUSH
10.0000 mL | INTRAVENOUS | Status: DC | PRN
  Administered 2020-08-05: 10 mL
  Filled 2020-08-05: qty 10

## 2020-08-05 MED ORDER — HEPARIN SOD (PORK) LOCK FLUSH 100 UNIT/ML IV SOLN
INTRAVENOUS | Status: AC
  Filled 2020-08-05: qty 5

## 2020-08-05 MED ORDER — HEPARIN SOD (PORK) LOCK FLUSH 100 UNIT/ML IV SOLN
500.0000 [IU] | Freq: Once | INTRAVENOUS | Status: AC | PRN
  Administered 2020-08-05: 500 [IU]
  Filled 2020-08-05: qty 5

## 2020-08-17 ENCOUNTER — Inpatient Hospital Stay: Payer: BC Managed Care – PPO

## 2020-08-17 ENCOUNTER — Other Ambulatory Visit: Payer: Self-pay

## 2020-08-17 ENCOUNTER — Inpatient Hospital Stay: Payer: BC Managed Care – PPO | Admitting: Internal Medicine

## 2020-08-17 DIAGNOSIS — C781 Secondary malignant neoplasm of mediastinum: Secondary | ICD-10-CM

## 2020-08-17 DIAGNOSIS — C182 Malignant neoplasm of ascending colon: Secondary | ICD-10-CM | POA: Diagnosis not present

## 2020-08-17 DIAGNOSIS — Z5111 Encounter for antineoplastic chemotherapy: Secondary | ICD-10-CM | POA: Diagnosis not present

## 2020-08-17 DIAGNOSIS — C801 Malignant (primary) neoplasm, unspecified: Secondary | ICD-10-CM

## 2020-08-17 DIAGNOSIS — Z7189 Other specified counseling: Secondary | ICD-10-CM

## 2020-08-17 LAB — COMPREHENSIVE METABOLIC PANEL
ALT: 17 U/L (ref 0–44)
AST: 21 U/L (ref 15–41)
Albumin: 3.9 g/dL (ref 3.5–5.0)
Alkaline Phosphatase: 50 U/L (ref 38–126)
Anion gap: 9 (ref 5–15)
BUN: 17 mg/dL (ref 8–23)
CO2: 26 mmol/L (ref 22–32)
Calcium: 8.9 mg/dL (ref 8.9–10.3)
Chloride: 98 mmol/L (ref 98–111)
Creatinine, Ser: 1.21 mg/dL (ref 0.61–1.24)
GFR, Estimated: >60 mL/min (ref >60)
Glucose, Bld: 138 mg/dL — ABNORMAL HIGH (ref 70–99)
Potassium: 4.5 mmol/L (ref 3.5–5.1)
Sodium: 133 mmol/L — ABNORMAL LOW (ref 135–145)
Total Bilirubin: 0.5 mg/dL (ref 0.3–1.2)
Total Protein: 6.9 g/dL (ref 6.5–8.1)

## 2020-08-17 LAB — CBC WITH DIFFERENTIAL/PLATELET
Abs Immature Granulocytes: 0.02 10*3/uL (ref 0.00–0.07)
Basophils Absolute: 0.1 10*3/uL (ref 0.0–0.1)
Basophils Relative: 1 %
Eosinophils Absolute: 0.8 10*3/uL — ABNORMAL HIGH (ref 0.0–0.5)
Eosinophils Relative: 11 %
HCT: 34 % — ABNORMAL LOW (ref 39.0–52.0)
Hemoglobin: 11.4 g/dL — ABNORMAL LOW (ref 13.0–17.0)
Immature Granulocytes: 0 %
Lymphocytes Relative: 26 %
Lymphs Abs: 2 10*3/uL (ref 0.7–4.0)
MCH: 29.6 pg (ref 26.0–34.0)
MCHC: 33.5 g/dL (ref 30.0–36.0)
MCV: 88.3 fL (ref 80.0–100.0)
Monocytes Absolute: 0.7 10*3/uL (ref 0.1–1.0)
Monocytes Relative: 9 %
Neutro Abs: 4.2 10*3/uL (ref 1.7–7.7)
Neutrophils Relative %: 53 %
Platelets: 149 10*3/uL — ABNORMAL LOW (ref 150–400)
RBC: 3.85 MIL/uL — ABNORMAL LOW (ref 4.22–5.81)
RDW: 15.3 % (ref 11.5–15.5)
WBC: 7.9 10*3/uL (ref 4.0–10.5)
nRBC: 0 % (ref 0.0–0.2)

## 2020-08-17 LAB — URINALYSIS, COMPLETE (UACMP) WITH MICROSCOPIC
Bacteria, UA: NONE SEEN
Bilirubin Urine: NEGATIVE
Glucose, UA: NEGATIVE mg/dL
Hgb urine dipstick: NEGATIVE
Ketones, ur: NEGATIVE mg/dL
Nitrite: NEGATIVE
Protein, ur: NEGATIVE mg/dL
Specific Gravity, Urine: 1.005 (ref 1.005–1.030)
pH: 7 (ref 5.0–8.0)

## 2020-08-17 MED ORDER — HEPARIN SOD (PORK) LOCK FLUSH 100 UNIT/ML IV SOLN
500.0000 [IU] | Freq: Once | INTRAVENOUS | Status: DC | PRN
  Filled 2020-08-17: qty 5

## 2020-08-17 MED ORDER — DEXAMETHASONE SODIUM PHOSPHATE 10 MG/ML IJ SOLN
4.0000 mg | Freq: Once | INTRAMUSCULAR | Status: AC
  Administered 2020-08-17: 4 mg via INTRAVENOUS
  Filled 2020-08-17: qty 1

## 2020-08-17 MED ORDER — SODIUM CHLORIDE 0.9 % IV SOLN
2400.0000 mg/m2 | INTRAVENOUS | Status: DC
  Administered 2020-08-17: 4200 mg via INTRAVENOUS
  Filled 2020-08-17: qty 84

## 2020-08-17 MED ORDER — SODIUM CHLORIDE 0.9% FLUSH
10.0000 mL | INTRAVENOUS | Status: DC | PRN
  Administered 2020-08-17: 10 mL via INTRAVENOUS
  Filled 2020-08-17: qty 10

## 2020-08-17 MED ORDER — SODIUM CHLORIDE 0.9 % IV SOLN
Freq: Once | INTRAVENOUS | Status: AC
  Filled 2020-08-17: qty 250

## 2020-08-17 MED ORDER — SODIUM CHLORIDE 0.9 % IV SOLN: 4.0000 mg | Freq: Once | INTRAVENOUS | Status: DC

## 2020-08-17 MED ORDER — BEVACIZUMAB-BVZR CHEMO INJECTION 400 MG/16ML
350.0000 mg | Freq: Once | INTRAVENOUS | Status: AC
Start: 2020-08-17 — End: 2020-08-17
  Administered 2020-08-17: 350 mg via INTRAVENOUS
  Filled 2020-08-17: qty 14

## 2020-08-17 NOTE — Assessment & Plan Note (Addendum)
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  No evidence of new metastatic disease in the chest, abdomen, or pelvis-stable.  # proceed with 5FU with Avastin;  Labs today reviewed;  acceptable for treatment today.  Plan CT scans in March 2022.   # Anemia- secondary- sec to chemo-hemoglobin on 11-12-STABLE.   # Back pain- MRI lumbar spine- July 2021-NEG for mets; severe arthritic changes; spinal canal stenosis- STABLE;  hydrocodone 1 day prn.  # Poorly controlled diabetes blood sugars-better controlled as per patients.; over all stable;  FBG-138 STABLE.   # LEFT hilar/LUL-  Limited stage small cell cancer-s/p chemo-RT; CT scan chest February 23, 2020-LUL radiation changes- STABLE;DEC 12th, 2021CT STABLE. .    # HTN- 140s/ 70-on Avastin- STABLE  continue current anti-HTNs.   # DISPOSITION:  # today 5FU + avastin;  d-3- pump de-access # follow up in 2 weeks-MD; labs-cbc/cmp/cea; UA; 5FU + avastin; D-3 pump de-access-Dr.B

## 2020-08-17 NOTE — Progress Notes (Signed)
[place] NOTE  Patient Care Team: Laneta Simmers, NP as PCP - General (Nurse Practitioner) Telford Nab, RN as Registered Nurse Clent Jacks, RN as Oncology Nurse Navigator  CHIEF COMPLAINTS/PURPOSE OF CONSULTATION: Lung cancer/colon cancer   Oncology History Overview Note  # July 2020- SMALL CELL CA METASTATIC TO MEDIASTINAL LN [open Bx; Dr.Oaks]; TxN2M0; July 2nd 2020-PET- 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  MRI brain negative  # aug 17th 2020-carbo etoposide-RT [? 8/19]; #4 cycle carbo-Etop [finished dec 30th,2020]  #August 2020 iron deficient anemia-question etiology; IV Feraheme [no colonoscopy]  # OCT 2020- colo/ Cecal adeno ca; MRI liver- 1 cm enhancing lesion "metastasis" [Dr.Anna/Dr.Cannon]-difficulty biopsy.  November 02-2019 right hemicolectomy- STAGE III [pT3pN1 (2/13LN)]- HOLD adjuvant therapy. MMR- INTACT/LOW  #May 2021-liver biopsy-possible adenocarcinoma; colorectal origin.  Stage IV colon cancer  #May 24-2021-FOLFOX with Avastin; AUG 31st,2021- STABLE liver lesion; SEP 1st, 2021- Discontinue LV+Ox sec to severe fatigue/PN; cont 5FU CIV + Avastin  # COPD/  DM-2- on OHA/ smoker/ PVD/peripheral neuropathy  # History of alcohol abuse/quit 2014/ Hx of abdominal trauma [at 21y]  #July 2021-foundation 1 NGS [liver metastases]- RAS WILD TYPE **  # Jan 1st, 2022-Covid infection [prior vaccination; monoclonal infusion-not hospitalized]  # DIAGNOSIS:   # SMALL CELL CA-limited stage-status post chemoradiation;   # colon cancer-stage IV-solitary liver lesion  GOALS: Control  CURRENT/MOST RECENT THERAPY : FOLFOX with Avastin   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 ([place])  01/29/2019 Initial Diagnosis   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 [facility])   02/09/2019 - 06/24/2019 Chemotherapy   The patient had palonosetron (ALOXI) injection 0.25  mg, 0.25 mg, Intravenous,  Once, 4 of 4 cycles Administration: 0.25 mg (02/09/2019), 0.25 mg (03/03/2019), 0.25 mg (06/02/2019), 0.25 mg (06/22/2019) CARBOplatin (PARAPLATIN) 410 mg in sodium chloride 0.9 % 250 mL chemo infusion, 410 mg (100 % of original dose 414 mg), Intravenous,  Once, 4 of 4 cycles Dose modification:   (original dose 414 mg, Cycle 1) Administration: 410 mg (02/09/2019), 410 mg (03/03/2019), 410 mg (06/02/2019), 410 mg (06/22/2019) etoposide (VEPESID) 180 mg in sodium chloride 0.9 % 500 mL chemo infusion, 100 mg/m2 = 180 mg, Intravenous,  Once, 4 of 4 cycles Administration: 180 mg (02/09/2019), 180 mg (02/10/2019), 180 mg (02/11/2019), 180 mg (03/03/2019), 180 mg (03/04/2019), 180 mg (03/05/2019), 180 mg (06/02/2019), 180 mg (06/03/2019), 180 mg (06/04/2019), 180 mg (06/22/2019), 180 mg (06/23/2019), 180 mg (06/24/2019) fosaprepitant (EMEND) 150 mg, dexamethasone (DECADRON) 6 mg in sodium chloride 0.9 % 145 mL IVPB, , Intravenous,  Once, 2 of 2 cycles Administration:  (06/02/2019),  (06/22/2019)  for chemotherapy treatment.    Cancer of ascending colon ([place])  05/04/2019 Initial Diagnosis   Cancer of ascending colon ([place])   11/30/2019 -  Chemotherapy    Patient is on Treatment Plan: COLORECTAL FOLFOX + BEVACIZUMAB Q14D       HISTORY OF PRESENTING ILLNESS:   Kevin Mckay 70 y.o.  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  Denies no new shortness of breath or cough.  Chronic mild tingling and numbness.  Chronic back pain for which he takes hydrocodone as needed.   Review of Systems  Constitutional: Positive for malaise/fatigue and weight loss. Negative for chills, diaphoresis and fever.  HENT: Negative for nosebleeds and sore throat.  Eyes: Negative for double vision.  Respiratory: Positive for shortness of breath. Negative for  hemoptysis and wheezing.   Cardiovascular: Negative for chest pain, palpitations and orthopnea.  Gastrointestinal: Negative for abdominal pain, blood in stool, constipation, diarrhea, heartburn, melena, nausea and vomiting.  Genitourinary: Negative for dysuria, frequency and urgency.  Musculoskeletal: Positive for back pain and joint pain.  Skin: Negative.  Negative for itching and rash.  Neurological: Positive for tingling. Negative for dizziness, focal weakness and weakness.  Endo/Heme/Allergies: Does not bruise/bleed easily.  Psychiatric/Behavioral: Negative for depression. The patient is not nervous/anxious and does not have insomnia.      MEDICAL HISTORY:  Past Medical History:  Diagnosis Date  . Asthma   . Cancer ([place])    Metastatic small cell lung cancer  . Cerebral aneurysm   . Chronic painful diabetic neuropathy ([place])   . Depression   . Diabetes mellitus without complication ([place])   . Essential hypertension   . History of kidney stones   . Occasional tremors   . Tobacco use     SURGICAL HISTORY: Past Surgical History:  Procedure Laterality Date  . ABDOMINAL SURGERY     age 64. trauma surgery due to forklift injury- liver and spleen  . COLONOSCOPY WITH PROPOFOL N/A 03/27/2019   Procedure: COLONOSCOPY WITH PROPOFOL;  Surgeon: Jonathon Bellows, MD;  Location: [facility] ENDOSCOPY;  Service: Gastroenterology;  Laterality: N/A;  . COLOSTOMY REVISION Right 05/04/2019   Procedure: COLON RESECTION RIGHT-right hemicolectomy-open;  Surgeon: Fredirick Maudlin, MD;  Location: [facility] ORS;  Service: General;  Laterality: Right;  . ESOPHAGOGASTRODUODENOSCOPY (EGD) WITH PROPOFOL N/A 03/27/2019   Procedure: ESOPHAGOGASTRODUODENOSCOPY (EGD) WITH PROPOFOL;  Surgeon: Jonathon Bellows, MD;  Location[facility] ENDOSCOPY;  Service: Gastroenterology;  Laterality: N/A;  . IR GENERIC HISTORICAL  05/31/2016   IR ANGIO VERTEBRAL SEL VERTEBRAL BILAT MOD SED 05/31/2016 Consuella Lose, MD [facility]-INTERV RAD  . IR GENERIC  HISTORICAL  05/31/2016   IR ANGIO INTRA EXTRACRAN SEL INTERNAL CAROTID BILAT MOD SED 05/31/2016 Consuella Lose, MD [facility]-INTERV RAD  . PORTACATH PLACEMENT Right 02/04/2019   Procedure: INSERTION PORT-A-CATH;  Surgeon: [name], MD;  Location: [facility] ORS;  Service: General;  Laterality: Right;  . THORACOTOMY Left 01/22/2019   Procedure: PRE OP BRONCH LEFT ANTERIOR THORACOTOMY WITH BIOPSY OF MEDIASTINAL MASS;  Surgeon: [name], MD;  Location: [facility] ORS;  Service: General;  Laterality: Left;    SOCIAL HISTORY: Social History   Socioeconomic History  . Marital status: Married    Spouse name: Not on file  . Number of children: Not on file  . Years of education: Not on file  . Highest education level: Not on file  Occupational History  . Not on file  Tobacco Use  . Smoking status: Current Every Day Smoker    Packs/day: 0.50    Years: 50.00    Pack years: 25.00    Types: Cigarettes  . Smokeless tobacco: Never Used  Vaping Use  . Vaping Use: Every day  . Substances: Nicotine  Substance and Sexual Activity  . Alcohol use: No  . Drug use: Yes    Types: Barbituates, Marijuana  . Sexual activity: Not on file  Other Topics Concern  . Not on file  Social History Narrative   Lives in [place]; wife/ son/grandson [custody]; smoker; vending business; hx of alcoholism- quit at 41.    Social Determinants of Health   Financial Resource Strain: Not on file  Food Insecurity: Not on file  Transportation Needs: Not on file  Physical  Activity: Not on file  Stress: Not on file  Social Connections: Not on file  Intimate Partner Violence: Not on file    FAMILY HISTORY: Family History  Problem Relation Age of Onset  . Lymphoma Father   . Liver cancer Paternal Uncle   . Lung cancer Paternal Uncle   . Lung cancer Maternal Uncle     ALLERGIES:  is allergic to ferumoxytol, amoxicillin, and codeine.  MEDICATIONS:  Current Outpatient Medications  Medication Sig Dispense Refill  .  amLODipine (NORVASC) 10 MG tablet Take 10 mg by mouth every morning.     . budesonide-formoterol (SYMBICORT) 160-4.5 MCG/ACT inhaler Inhale 2 puffs into the lungs 2 (two) times daily.    . DULoxetine (CYMBALTA) 30 MG capsule Take 30 mg by mouth every morning.     . gabapentin (NEURONTIN) 800 MG tablet Take 800 mg by mouth 3 (three) times daily.    Marland Kitchen HYDROcodone-acetaminophen (NORCO/VICODIN) 5-325 MG tablet Tale one tablet a day as needed for pain. 45 tablet 0  . insulin glargine (LANTUS) 100 UNIT/ML injection Inject into the skin daily. Units based By sliding scale    . Ipratropium-Albuterol (COMBIVENT) 20-100 MCG/ACT AERS respimat Inhale into the lungs.    . lidocaine-prilocaine (EMLA) cream Apply 1 application topically as needed. 30-45 mins prior to port access. 30 g 0  . linaclotide (LINZESS) 145 MCG CAPS capsule Take 145 mcg by mouth daily before breakfast.    . lisinopril-hydrochlorothiazide (ZESTORETIC) 10-12.5 MG tablet Take 1 tablet by mouth daily.    Marland Kitchen lovastatin (MEVACOR) 20 MG tablet Take 20 mg by mouth every evening.    . metFORMIN (GLUCOPHAGE) 1000 MG tablet Take 1,000 mg by mouth 2 (two) times daily with a meal.     . metFORMIN (GLUCOPHAGE-XR) 500 MG 24 hr tablet Take 1,000 mg by mouth 2 (two) times daily.    Marland Kitchen omeprazole (PRILOSEC) 40 MG capsule Take 1 capsule (40 mg total) by mouth daily. 90 capsule 1  . ondansetron (ZOFRAN) 8 MG tablet Take 1 tablet (8 mg total) by mouth every 8 (eight) hours as needed for nausea or vomiting (start 3 days; after chemo). 40 tablet 1  . polyethylene glycol powder (MIRALAX) 17 GM/SCOOP powder Mix full container in 64 ounces of Gatorade or other clear liquid. NO RED Liquids 238 g 0  . prazosin (MINIPRESS) 1 MG capsule Take 1 mg by mouth at bedtime.    . pyridOXINE (VITAMIN B-6) 100 MG tablet Take 100 mg by mouth daily.    . sitaGLIPtin (JANUVIA) 25 MG tablet Take 25 mg by mouth daily.    . vitamin B-12 (CYANOCOBALAMIN) 100 MCG tablet Take 100 mcg by  mouth daily.     No current facility-administered medications for this visit.      Marland Kitchen  PHYSICAL EXAMINATION: ECOG PERFORMANCE STATUS: 0 - Asymptomatic  Vitals:   08/17/20 0853  BP: 139/68  Pulse: 69  Resp: 18  Temp: (!) 96.8 F (36 C)   Filed Weights   08/17/20 0853  Weight: 155 lb (70.3 kg)    Physical Exam HENT:     Head: Normocephalic and atraumatic.     Mouth/Throat:     Pharynx: No oropharyngeal exudate.  Eyes:     Pupils: Pupils are equal, round, and reactive to light.  Cardiovascular:     Rate and Rhythm: Normal rate and regular rhythm.  Pulmonary:     Effort: No respiratory distress.     Breath sounds: No wheezing.  Abdominal:  General: Bowel sounds are normal. There is no distension.     Palpations: Abdomen is soft. There is no mass.     Tenderness: There is no abdominal tenderness. There is no guarding or rebound.     Comments: Abdominal incision well-healed.  Musculoskeletal:        General: No tenderness. Normal range of motion.     Cervical back: Normal range of motion and neck supple.  Skin:    General: Skin is warm.  Neurological:     Mental Status: He is alert and oriented to person, place, and time.  Psychiatric:        Mood and Affect: Affect normal.     LABORATORY DATA:  I have reviewed the data as listed Lab Results  Component Value Date   WBC 7.9 08/17/2020   HGB 11.4 (L) 08/17/2020   HCT 34.0 (L) 08/17/2020   MCV 88.3 08/17/2020   PLT 149 (L) 08/17/2020   Recent Labs    02/24/20 0819 03/09/20 0813 03/23/20 0850 04/06/20 0813 07/20/20 0850 08/03/20 0822 08/17/20 0837  NA 134* 132* 136   < > 132* 132* 133*  K 4.1 3.9 3.6   < > 5.0 4.8 4.5  CL 98 97* 100   < > 98 97* 98  CO2 '26 25 25   ' < > '25 25 26  ' GLUCOSE 196* 260* 170*   < > 156* 187* 138*  BUN 33* 16 17   < > '22 16 17  ' CREATININE 0.96 0.96 0.97   < > 1.03 1.16 1.21  CALCIUM 8.6* 8.6* 9.1   < > 9.2 9.0 8.9  GFRNONAA >60 >60 >60   < > >60 >60 >60  GFRAA >60 >60  >60  --   --   --   --   PROT 6.8 6.8 6.9   < > 7.1 7.0 6.9  ALBUMIN 3.6 3.6 3.8   < > 3.8 4.0 3.9  AST '20 26 20   ' < > '21 21 21  ' ALT '17 19 18   ' < > '15 17 17  ' ALKPHOS 59 64 59   < > 45 54 50  BILITOT 0.3 0.6 0.6   < > 0.5 0.6 0.5   < > = values in this interval not displayed.    RADIOGRAPHIC STUDIES: I have personally reviewed the radiological images as listed and agreed with the findings in the report. No results found.  ASSESSMENT & PLAN:   Cancer of ascending colon ([place]) #Right colon cancer stage IV- liver metastases; adenocarcinoma; on 5FU + avastin; CT DEC 12th, 2021-  Unchanged post treatment appearance of a suprahilar mass of the left upper lobe STABLE-metastatic lesion of the lateral liver dome.  No evidence of new metastatic disease in the chest, abdomen, or pelvis-stable.  # proceed with 5FU with Avastin;  Labs today reviewed;  acceptable for treatment today.  Plan CT scans in March 2022.   # Anemia- secondary- sec to chemo-hemoglobin on 11-12-STABLE.   # Back pain- MRI lumbar spine- July 2021-NEG for mets; severe arthritic changes; spinal canal stenosis- STABLE;  hydrocodone 1 day prn.  # Poorly controlled diabetes blood sugars-better controlled as per patients.; over all stable;  FBG-138 STABLE.   # LEFT hilar/LUL-  Limited stage small cell cancer-s/p chemo-RT; CT scan chest February 23, 2020-LUL radiation changes- STABLE;DEC 12th, 2021CT STABLE. .    # HTN- 140s/ 70-on Avastin- STABLE  continue current anti-HTNs.   # DISPOSITION:  # today 5FU +  avastin;  d-3- pump de-access # follow up in 2 weeks-MD; labs-cbc/cmp/cea; UA; 5FU + avastin; D-3 pump de-access-Dr.B     All questions were answered. The patient knows to call the clinic with any problems, questions or concerns.    Cammie Sickle, MD 08/22/2020 7:52 PM

## 2020-08-18 LAB — CEA: CEA: 3.6 ng/mL (ref 0.0–4.7)

## 2020-08-19 ENCOUNTER — Inpatient Hospital Stay: Payer: BC Managed Care – PPO

## 2020-08-19 DIAGNOSIS — Z5111 Encounter for antineoplastic chemotherapy: Secondary | ICD-10-CM | POA: Diagnosis not present

## 2020-08-19 DIAGNOSIS — C182 Malignant neoplasm of ascending colon: Secondary | ICD-10-CM

## 2020-08-19 DIAGNOSIS — Z7189 Other specified counseling: Secondary | ICD-10-CM

## 2020-08-19 MED ORDER — HEPARIN SOD (PORK) LOCK FLUSH 100 UNIT/ML IV SOLN
500.0000 [IU] | Freq: Once | INTRAVENOUS | Status: AC | PRN
  Administered 2020-08-19: 500 [IU]
  Filled 2020-08-19: qty 5

## 2020-08-19 MED ORDER — SODIUM CHLORIDE 0.9% FLUSH
10.0000 mL | INTRAVENOUS | Status: DC | PRN
  Administered 2020-08-19: 10 mL
  Filled 2020-08-19: qty 10

## 2020-08-31 ENCOUNTER — Inpatient Hospital Stay: Payer: BC Managed Care – PPO | Attending: Internal Medicine

## 2020-08-31 ENCOUNTER — Other Ambulatory Visit: Payer: Self-pay

## 2020-08-31 ENCOUNTER — Encounter: Payer: Self-pay | Admitting: Internal Medicine

## 2020-08-31 ENCOUNTER — Inpatient Hospital Stay (HOSPITAL_BASED_OUTPATIENT_CLINIC_OR_DEPARTMENT_OTHER): Payer: BC Managed Care – PPO | Admitting: Internal Medicine

## 2020-08-31 ENCOUNTER — Inpatient Hospital Stay (HOSPITAL_BASED_OUTPATIENT_CLINIC_OR_DEPARTMENT_OTHER): Payer: BC Managed Care – PPO

## 2020-08-31 VITALS — BP 155/73 | HR 66 | Temp 97.2°F | Resp 16 | Ht 68.0 in | Wt 150.0 lb

## 2020-08-31 DIAGNOSIS — J449 Chronic obstructive pulmonary disease, unspecified: Secondary | ICD-10-CM | POA: Diagnosis not present

## 2020-08-31 DIAGNOSIS — C182 Malignant neoplasm of ascending colon: Secondary | ICD-10-CM

## 2020-08-31 DIAGNOSIS — E119 Type 2 diabetes mellitus without complications: Secondary | ICD-10-CM | POA: Insufficient documentation

## 2020-08-31 DIAGNOSIS — E871 Hypo-osmolality and hyponatremia: Secondary | ICD-10-CM | POA: Diagnosis not present

## 2020-08-31 DIAGNOSIS — C781 Secondary malignant neoplasm of mediastinum: Secondary | ICD-10-CM

## 2020-08-31 DIAGNOSIS — M549 Dorsalgia, unspecified: Secondary | ICD-10-CM | POA: Diagnosis not present

## 2020-08-31 DIAGNOSIS — Z7189 Other specified counseling: Secondary | ICD-10-CM

## 2020-08-31 DIAGNOSIS — G8929 Other chronic pain: Secondary | ICD-10-CM | POA: Diagnosis not present

## 2020-08-31 DIAGNOSIS — Z79899 Other long term (current) drug therapy: Secondary | ICD-10-CM | POA: Diagnosis not present

## 2020-08-31 DIAGNOSIS — C787 Secondary malignant neoplasm of liver and intrahepatic bile duct: Secondary | ICD-10-CM | POA: Insufficient documentation

## 2020-08-31 DIAGNOSIS — Z794 Long term (current) use of insulin: Secondary | ICD-10-CM | POA: Diagnosis not present

## 2020-08-31 DIAGNOSIS — F17210 Nicotine dependence, cigarettes, uncomplicated: Secondary | ICD-10-CM | POA: Diagnosis not present

## 2020-08-31 DIAGNOSIS — C801 Malignant (primary) neoplasm, unspecified: Secondary | ICD-10-CM

## 2020-08-31 DIAGNOSIS — I10 Essential (primary) hypertension: Secondary | ICD-10-CM | POA: Diagnosis not present

## 2020-08-31 DIAGNOSIS — F32A Depression, unspecified: Secondary | ICD-10-CM | POA: Insufficient documentation

## 2020-08-31 DIAGNOSIS — Z7984 Long term (current) use of oral hypoglycemic drugs: Secondary | ICD-10-CM | POA: Diagnosis not present

## 2020-08-31 DIAGNOSIS — Z95828 Presence of other vascular implants and grafts: Secondary | ICD-10-CM

## 2020-08-31 DIAGNOSIS — Z5111 Encounter for antineoplastic chemotherapy: Secondary | ICD-10-CM | POA: Insufficient documentation

## 2020-08-31 LAB — CBC WITH DIFFERENTIAL/PLATELET
Abs Immature Granulocytes: 0.01 10*3/uL (ref 0.00–0.07)
Basophils Absolute: 0.1 10*3/uL (ref 0.0–0.1)
Basophils Relative: 1 %
Eosinophils Absolute: 0.9 10*3/uL — ABNORMAL HIGH (ref 0.0–0.5)
Eosinophils Relative: 14 %
HCT: 37.1 % — ABNORMAL LOW (ref 39.0–52.0)
Hemoglobin: 12.4 g/dL — ABNORMAL LOW (ref 13.0–17.0)
Immature Granulocytes: 0 %
Lymphocytes Relative: 25 %
Lymphs Abs: 1.7 10*3/uL (ref 0.7–4.0)
MCH: 29.5 pg (ref 26.0–34.0)
MCHC: 33.4 g/dL (ref 30.0–36.0)
MCV: 88.3 fL (ref 80.0–100.0)
Monocytes Absolute: 0.5 10*3/uL (ref 0.1–1.0)
Monocytes Relative: 8 %
Neutro Abs: 3.5 10*3/uL (ref 1.7–7.7)
Neutrophils Relative %: 52 %
Platelets: 142 10*3/uL — ABNORMAL LOW (ref 150–400)
RBC: 4.2 MIL/uL — ABNORMAL LOW (ref 4.22–5.81)
RDW: 15.1 % (ref 11.5–15.5)
WBC: 6.6 10*3/uL (ref 4.0–10.5)
nRBC: 0 % (ref 0.0–0.2)

## 2020-08-31 LAB — URINALYSIS, COMPLETE (UACMP) WITH MICROSCOPIC
Bilirubin Urine: NEGATIVE
Glucose, UA: >=500 mg/dL — AB
Ketones, ur: NEGATIVE mg/dL
Leukocytes,Ua: NEGATIVE
Nitrite: NEGATIVE
Protein, ur: 30 mg/dL — AB
Specific Gravity, Urine: 1.015 (ref 1.005–1.030)
pH: 5 (ref 5.0–8.0)

## 2020-08-31 LAB — COMPREHENSIVE METABOLIC PANEL
ALT: 16 U/L (ref 0–44)
AST: 20 U/L (ref 15–41)
Albumin: 4 g/dL (ref 3.5–5.0)
Alkaline Phosphatase: 53 U/L (ref 38–126)
Anion gap: 10 (ref 5–15)
BUN: 17 mg/dL (ref 8–23)
CO2: 24 mmol/L (ref 22–32)
Calcium: 9 mg/dL (ref 8.9–10.3)
Chloride: 100 mmol/L (ref 98–111)
Creatinine, Ser: 1.27 mg/dL — ABNORMAL HIGH (ref 0.61–1.24)
GFR, Estimated: >60 mL/min (ref >60)
Glucose, Bld: 263 mg/dL — ABNORMAL HIGH (ref 70–99)
Potassium: 4.4 mmol/L (ref 3.5–5.1)
Sodium: 134 mmol/L — ABNORMAL LOW (ref 135–145)
Total Bilirubin: 0.9 mg/dL (ref 0.3–1.2)
Total Protein: 7.3 g/dL (ref 6.5–8.1)

## 2020-08-31 MED ORDER — HEPARIN SOD (PORK) LOCK FLUSH 100 UNIT/ML IV SOLN
500.0000 [IU] | Freq: Once | INTRAVENOUS | Status: DC
  Filled 2020-08-31: qty 5

## 2020-08-31 MED ORDER — SODIUM CHLORIDE 0.9% FLUSH
10.0000 mL | Freq: Once | INTRAVENOUS | Status: AC
  Administered 2020-08-31: 10 mL via INTRAVENOUS
  Filled 2020-08-31: qty 10

## 2020-08-31 MED ORDER — SODIUM CHLORIDE 0.9 % IV SOLN
2400.0000 mg/m2 | INTRAVENOUS | Status: DC
  Administered 2020-08-31: 4200 mg via INTRAVENOUS
  Filled 2020-08-31: qty 84

## 2020-08-31 MED ORDER — SODIUM CHLORIDE 0.9 % IV SOLN: 4.0000 mg | Freq: Once | INTRAVENOUS | Status: DC

## 2020-08-31 MED ORDER — SODIUM CHLORIDE 0.9 % IV SOLN
Freq: Once | INTRAVENOUS | Status: AC
  Filled 2020-08-31: qty 250

## 2020-08-31 MED ORDER — DEXAMETHASONE SODIUM PHOSPHATE 10 MG/ML IJ SOLN
4.0000 mg | Freq: Once | INTRAMUSCULAR | Status: AC
  Administered 2020-08-31: 4 mg via INTRAVENOUS
  Filled 2020-08-31: qty 1

## 2020-08-31 MED ORDER — SODIUM CHLORIDE 0.9 % IV SOLN
5.5000 mg/kg | Freq: Once | INTRAVENOUS | Status: AC
  Administered 2020-08-31: 350 mg via INTRAVENOUS
  Filled 2020-08-31: qty 14

## 2020-08-31 NOTE — Progress Notes (Signed)
[place] NOTE  Patient Care Team: Laneta Simmers, NP as PCP - General (Nurse Practitioner) Telford Nab, RN as Registered Nurse Clent Jacks, RN as Oncology Nurse Navigator  CHIEF COMPLAINTS/PURPOSE OF CONSULTATION: Lung cancer/colon cancer   Oncology History Overview Note  # July 2020- SMALL CELL CA METASTATIC TO MEDIASTINAL LN [open Bx; Dr.Oaks]; TxN2M0; July 2nd 2020-PET- 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  MRI brain negative  # aug 17th 2020-carbo etoposide-RT [? 8/19]; #4 cycle carbo-Etop [finished dec 30th,2020]  #August 2020 iron deficient anemia-question etiology; IV Feraheme [no colonoscopy]  # OCT 2020- colo/ Cecal adeno ca; MRI liver- 1 cm enhancing lesion "metastasis" [Dr.Anna/Dr.Cannon]-difficulty biopsy.  November 02-2019 right hemicolectomy- STAGE III [pT3pN1 (2/13LN)]- HOLD adjuvant therapy. MMR- INTACT/LOW  #May 2021-liver biopsy-possible adenocarcinoma; colorectal origin.  Stage IV colon cancer  #May 24-2021-FOLFOX with Avastin; AUG 31st,2021- STABLE liver lesion; SEP 1st, 2021- Discontinue LV+Ox sec to severe fatigue/PN; cont 5FU CIV + Avastin  # COPD/  DM-2- on OHA/ smoker/ PVD/peripheral neuropathy  # History of alcohol abuse/quit 2014/ Hx of abdominal trauma [at 21y]  #July 2021-foundation 1 NGS [liver metastases]- RAS WILD TYPE **  # Jan 1st, 2022-Covid infection [prior vaccination; monoclonal infusion-not hospitalized]  # DIAGNOSIS:   # SMALL CELL CA-limited stage-status post chemoradiation;   # colon cancer-stage IV-solitary liver lesion  GOALS: Control  CURRENT/MOST RECENT THERAPY : FOLFOX with Avastin   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 ([place])  01/29/2019 Initial Diagnosis   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 [facility])   02/09/2019 - 06/24/2019 Chemotherapy   The patient had palonosetron (ALOXI) injection 0.25  mg, 0.25 mg, Intravenous,  Once, 4 of 4 cycles Administration: 0.25 mg (02/09/2019), 0.25 mg (03/03/2019), 0.25 mg (06/02/2019), 0.25 mg (06/22/2019) CARBOplatin (PARAPLATIN) 410 mg in sodium chloride 0.9 % 250 mL chemo infusion, 410 mg (100 % of original dose 414 mg), Intravenous,  Once, 4 of 4 cycles Dose modification:   (original dose 414 mg, Cycle 1) Administration: 410 mg (02/09/2019), 410 mg (03/03/2019), 410 mg (06/02/2019), 410 mg (06/22/2019) etoposide (VEPESID) 180 mg in sodium chloride 0.9 % 500 mL chemo infusion, 100 mg/m2 = 180 mg, Intravenous,  Once, 4 of 4 cycles Administration: 180 mg (02/09/2019), 180 mg (02/10/2019), 180 mg (02/11/2019), 180 mg (03/03/2019), 180 mg (03/04/2019), 180 mg (03/05/2019), 180 mg (06/02/2019), 180 mg (06/03/2019), 180 mg (06/04/2019), 180 mg (06/22/2019), 180 mg (06/23/2019), 180 mg (06/24/2019) fosaprepitant (EMEND) 150 mg, dexamethasone (DECADRON) 6 mg in sodium chloride 0.9 % 145 mL IVPB, , Intravenous,  Once, 2 of 2 cycles Administration:  (06/02/2019),  (06/22/2019)  for chemotherapy treatment.    Cancer of ascending colon ([place])  05/04/2019 Initial Diagnosis   Cancer of ascending colon ([place])   11/30/2019 -  Chemotherapy    Patient is on Treatment Plan: COLORECTAL FOLFOX + BEVACIZUMAB Q14D       HISTORY OF PRESENTING ILLNESS:   Kevin Mckay 70 y.o.  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  Patient denies any new headaches.  Denies any new shortness of breath or cough.  Chronic back pain for which he takes hydrocodone as needed.  Mild tingling and numbness.  Not any worse.   Review of Systems  Constitutional: Positive for malaise/fatigue and weight loss. Negative for chills, diaphoresis and fever.  HENT: Negative for nosebleeds and sore throat.   Eyes: Negative for double vision.  Respiratory: Positive  for shortness of breath. Negative for hemoptysis and wheezing.   Cardiovascular: Negative for chest pain, palpitations and orthopnea.  Gastrointestinal: Negative for abdominal pain, blood in stool, constipation, diarrhea, heartburn, melena, nausea and vomiting.  Genitourinary: Negative for dysuria, frequency and urgency.  Musculoskeletal: Positive for back pain and joint pain.  Skin: Negative.  Negative for itching and rash.  Neurological: Positive for tingling. Negative for dizziness, focal weakness and weakness.  Endo/Heme/Allergies: Does not bruise/bleed easily.  Psychiatric/Behavioral: Negative for depression. The patient is not nervous/anxious and does not have insomnia.      MEDICAL HISTORY:  Past Medical History:  Diagnosis Date  . Asthma   . Cancer ([place])    Metastatic small cell lung cancer  . Cerebral aneurysm   . Chronic painful diabetic neuropathy ([place])   . Depression   . Diabetes mellitus without complication ([place])   . Essential hypertension   . History of kidney stones   . Occasional tremors   . Tobacco use     SURGICAL HISTORY: Past Surgical History:  Procedure Laterality Date  . ABDOMINAL SURGERY     age 75. trauma surgery due to forklift injury- liver and spleen  . COLONOSCOPY WITH PROPOFOL N/A 03/27/2019   Procedure: COLONOSCOPY WITH PROPOFOL;  Surgeon: Jonathon Bellows, MD;  Location: [facility] ENDOSCOPY;  Service: Gastroenterology;  Laterality: N/A;  . COLOSTOMY REVISION Right 05/04/2019   Procedure: COLON RESECTION RIGHT-right hemicolectomy-open;  Surgeon: Fredirick Maudlin, MD;  Location: [facility] ORS;  Service: General;  Laterality: Right;  . ESOPHAGOGASTRODUODENOSCOPY (EGD) WITH PROPOFOL N/A 03/27/2019   Procedure: ESOPHAGOGASTRODUODENOSCOPY (EGD) WITH PROPOFOL;  Surgeon: Jonathon Bellows, MD;  Location[facility] ENDOSCOPY;  Service: Gastroenterology;  Laterality: N/A;  . IR GENERIC HISTORICAL  05/31/2016   IR ANGIO VERTEBRAL SEL VERTEBRAL BILAT MOD SED 05/31/2016 Consuella Lose,  MD [facility]-INTERV RAD  . IR GENERIC HISTORICAL  05/31/2016   IR ANGIO INTRA EXTRACRAN SEL INTERNAL CAROTID BILAT MOD SED 05/31/2016 Consuella Lose, MD [facility]-INTER[facility] RAD  . PORTACATH PLACEMENT Right 02/04/2019   Procedure: INSERTION PORT-A-CATH;  Surgeon: [name], MD;  Location: [facility] ORS;  Service: General;  Laterality: Right;  . THORACOTOMY Left 01/22/2019   Procedure: PRE OP BRONCH LEFT ANTERIOR THORACOTOMY WITH BIOPSY OF MEDIASTINAL MASS;  Surgeon: [name], MD;  Location: [facility] ORS;  Service: General;  Laterality: Left;    SOCIAL HISTORY: Social History   Socioeconomic History  . Marital status: Married    Spouse name: Not on file  . Number of children: Not on file  . Years of education: Not on file  . Highest education level: Not on file  Occupational History  . Not on file  Tobacco Use  . Smoking status: Current Every Day Smoker    Packs/day: 0.50    Years: 50.00    Pack years: 25.00    Types: Cigarettes  . Smokeless tobacco: Never Used  Vaping Use  . Vaping Use: Every day  . Substances: Nicotine  Substance and Sexual Activity  . Alcohol use: No  . Drug use: Yes    Types: Barbituates, Marijuana  . Sexual activity: Not on file  Other Topics Concern  . Not on file  Social History Narrative   Lives in [place]; wife/ son/grandson [custody]; smoker; vending business; hx of alcoholism- quit at 58.    Social Determinants of Health   Financial Resource Strain: Not on file  Food Insecurity: Not on  file  Transportation Needs: Not on file  Physical Activity: Not on file  Stress: Not on file  Social Connections: Not on file  Intimate Partner Violence: Not on file    FAMILY HISTORY: Family History  Problem Relation Age of Onset  . Lymphoma Father   . Liver cancer Paternal Uncle   . Lung cancer Paternal Uncle   . Lung cancer Maternal Uncle     ALLERGIES:  is allergic to ferumoxytol, amoxicillin, and codeine.  MEDICATIONS:  Current Outpatient Medications   Medication Sig Dispense Refill  . amLODipine (NORVASC) 10 MG tablet Take 10 mg by mouth every morning.     . budesonide-formoterol (SYMBICORT) 160-4.5 MCG/ACT inhaler Inhale 2 puffs into the lungs 2 (two) times daily.    . DULoxetine (CYMBALTA) 30 MG capsule Take 30 mg by mouth every morning.     . gabapentin (NEURONTIN) 800 MG tablet Take 800 mg by mouth 3 (three) times daily.    Marland Kitchen HYDROcodone-acetaminophen (NORCO/VICODIN) 5-325 MG tablet Tale one tablet a day as needed for pain. 45 tablet 0  . insulin glargine (LANTUS) 100 UNIT/ML injection Inject into the skin daily. Units based By sliding scale    . lidocaine-prilocaine (EMLA) cream Apply 1 application topically as needed. 30-45 mins prior to port access. 30 g 0  . linaclotide (LINZESS) 145 MCG CAPS capsule Take 145 mcg by mouth daily before breakfast.    . lisinopril-hydrochlorothiazide (ZESTORETIC) 10-12.5 MG tablet Take 1 tablet by mouth daily.    Marland Kitchen lovastatin (MEVACOR) 20 MG tablet Take 20 mg by mouth every evening.    . metFORMIN (GLUCOPHAGE) 1000 MG tablet Take 1,000 mg by mouth 2 (two) times daily with a meal.     . metFORMIN (GLUCOPHAGE-XR) 500 MG 24 hr tablet Take 1,000 mg by mouth 2 (two) times daily.    Marland Kitchen omeprazole (PRILOSEC) 40 MG capsule Take 1 capsule (40 mg total) by mouth daily. 90 capsule 1  . ondansetron (ZOFRAN) 8 MG tablet Take 1 tablet (8 mg total) by mouth every 8 (eight) hours as needed for nausea or vomiting (start 3 days; after chemo). 40 tablet 1  . polyethylene glycol powder (MIRALAX) 17 GM/SCOOP powder Mix full container in 64 ounces of Gatorade or other clear liquid. NO RED Liquids 238 g 0  . prazosin (MINIPRESS) 1 MG capsule Take 1 mg by mouth at bedtime.    . pyridOXINE (VITAMIN B-6) 100 MG tablet Take 100 mg by mouth daily.    . sitaGLIPtin (JANUVIA) 25 MG tablet Take 25 mg by mouth daily.    . vitamin B-12 (CYANOCOBALAMIN) 100 MCG tablet Take 100 mcg by mouth daily.    . Ipratropium-Albuterol (COMBIVENT)  20-100 MCG/ACT AERS respimat Inhale into the lungs.     No current facility-administered medications for this visit.   Facility-Administered Medications Ordered in Other Visits  Medication Dose Route Frequency Provider Last Rate Last Admin  . heparin lock flush 100 unit/mL  500 Units Intravenous Once Cammie Sickle, MD          .  PHYSICAL EXAMINATION: ECOG PERFORMANCE STATUS: 0 - Asymptomatic  Vitals:   08/31/20 0835  BP: (!) 155/73  Pulse: 66  Resp: 16  Temp: (!) 97.2 F (36.2 C)  SpO2: 99%   Filed Weights   08/31/20 0835  Weight: 150 lb (68 kg)    Physical Exam HENT:     Head: Normocephalic and atraumatic.     Mouth/Throat:     Pharynx: No oropharyngeal  exudate.  Eyes:     Pupils: Pupils are equal, round, and reactive to light.  Cardiovascular:     Rate and Rhythm: Normal rate and regular rhythm.  Pulmonary:     Effort: No respiratory distress.     Breath sounds: No wheezing.  Abdominal:     General: Bowel sounds are normal. There is no distension.     Palpations: Abdomen is soft. There is no mass.     Tenderness: There is no abdominal tenderness. There is no guarding or rebound.     Comments: Abdominal incision well-healed.  Musculoskeletal:        General: No tenderness. Normal range of motion.     Cervical back: Normal range of motion and neck supple.  Skin:    General: Skin is warm.  Neurological:     Mental Status: He is alert and oriented to person, place, and time.  Psychiatric:        Mood and Affect: Affect normal.     LABORATORY DATA:  I have reviewed the data as listed Lab Results  Component Value Date   WBC 6.6 08/31/2020   HGB 12.4 (L) 08/31/2020   HCT 37.1 (L) 08/31/2020   MCV 88.3 08/31/2020   PLT 142 (L) 08/31/2020   Recent Labs    02/24/20 0819 03/09/20 0813 03/23/20 0850 04/06/20 0813 08/03/20 0822 08/17/20 0837 08/31/20 0821  NA 134* 132* 136   < > 132* 133* 134*  K 4.1 3.9 3.6   < > 4.8 4.5 4.4  CL 98 97* 100    < > 97* 98 100  CO2 '26 25 25   ' < > '25 26 24  ' GLUCOSE 196* 260* 170*   < > 187* 138* 263*  BUN 33* 16 17   < > '16 17 17  ' CREATININE 0.96 0.96 0.97   < > 1.16 1.21 1.27*  CALCIUM 8.6* 8.6* 9.1   < > 9.0 8.9 9.0  GFRNONAA >60 >60 >60   < > >60 >60 >60  GFRAA >60 >60 >60  --   --   --   --   PROT 6.8 6.8 6.9   < > 7.0 6.9 7.3  ALBUMIN 3.6 3.6 3.8   < > 4.0 3.9 4.0  AST '20 26 20   ' < > '21 21 20  ' ALT '17 19 18   ' < > '17 17 16  ' ALKPHOS 59 64 59   < > 54 50 53  BILITOT 0.3 0.6 0.6   < > 0.6 0.5 0.9   < > = values in this interval not displayed.    RADIOGRAPHIC STUDIES: I have personally reviewed the radiological images as listed and agreed with the findings in the report. No results found.  ASSESSMENT & PLAN:   Cancer of ascending colon ([place]) #Right colon cancer stage IV- liver metastases; adenocarcinoma; on 5FU + avastin; CT DEC 12th, 2021-  Unchanged post treatment appearance of a suprahilar mass of the left upper lobe STABLE-metastatic lesion of the lateral liver dome.  No evidence of new metastatic disease in the chest, abdomen, or pelvis- STABLE..  # proceed with 5FU with Avastin;  Labs today reviewed;  acceptable for treatment today.  Will order CT scans today  # Anemia- secondary- sec to chemo-hemoglobin on 11-12-STABLE.   # Back pain- MRI lumbar spine- July 2021-NEG for mets; severe arthritic changes; spinal canal stenosis- STABLE;  hydrocodone 1 day prn.  # Poorly controlled diabetes blood sugars-better controlled as per patients.;  over all stable;  FBG-138 STABLE.   # LEFT hilar/LUL-  Limited stage small cell cancer-s/p chemo-RT; CT scan chest February 23, 2020-LUL radiation changes- STABLE;DEC 12th, 2021CT STABLE. .    # HTN- 140s/ 70-on Avastin- STABLE  continue current anti-HTNs.   # DISPOSITION:  # today 5FU + avastin;  d-3- pump de-access # follow up in 2 weeks-MD; labs-cbc/cmp/cea; UA; 5FU + avastin; D-3 pump de-access; CT C/A/P prior--Dr.B     All questions were  answered. The patient knows to call the clinic with any problems, questions or concerns.    Cammie Sickle, MD 08/31/2020 9:10 AM

## 2020-08-31 NOTE — Assessment & Plan Note (Addendum)
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  No evidence of new metastatic disease in the chest, abdomen, or pelvis- STABLE..  # proceed with 5FU with Avastin;  Labs today reviewed;  acceptable for treatment today.  Will order CT scans today  # Anemia- secondary- sec to chemo-hemoglobin on 11-12-STABLE.   # Back pain- MRI lumbar spine- July 2021-NEG for mets; severe arthritic changes; spinal canal stenosis- STABLE;  hydrocodone 1 day prn.  # Poorly controlled diabetes blood sugars-better controlled as per patients.; over all stable;  FBG-138 STABLE.   # LEFT hilar/LUL-  Limited stage small cell cancer-s/p chemo-RT; CT scan chest February 23, 2020-LUL radiation changes- STABLE;DEC 12th, 2021CT STABLE. .    # HTN- 140s/ 70-on Avastin- STABLE  continue current anti-HTNs.   # DISPOSITION:  # today 5FU + avastin;  d-3- pump de-access # follow up in 2 weeks-MD; labs-cbc/cmp/cea; UA; 5FU + avastin; D-3 pump de-access; CT C/A/P prior--Dr.B

## 2020-09-01 LAB — CEA: CEA: 4.1 ng/mL (ref 0.0–4.7)

## 2020-09-02 ENCOUNTER — Inpatient Hospital Stay: Payer: BC Managed Care – PPO

## 2020-09-02 DIAGNOSIS — C182 Malignant neoplasm of ascending colon: Secondary | ICD-10-CM | POA: Diagnosis not present

## 2020-09-02 DIAGNOSIS — Z7189 Other specified counseling: Secondary | ICD-10-CM

## 2020-09-02 MED ORDER — HEPARIN SOD (PORK) LOCK FLUSH 100 UNIT/ML IV SOLN
500.0000 [IU] | Freq: Once | INTRAVENOUS | Status: AC | PRN
  Administered 2020-09-02: 500 [IU]
  Filled 2020-09-02: qty 5

## 2020-09-02 MED ORDER — SODIUM CHLORIDE 0.9% FLUSH
10.0000 mL | INTRAVENOUS | Status: DC | PRN
  Administered 2020-09-02: 10 mL
  Filled 2020-09-02: qty 10

## 2020-09-02 MED ORDER — HEPARIN SOD (PORK) LOCK FLUSH 100 UNIT/ML IV SOLN
INTRAVENOUS | Status: AC
  Filled 2020-09-02: qty 5

## 2020-09-08 ENCOUNTER — Ambulatory Visit
Admission: RE | Admit: 2020-09-08 | Discharge: 2020-09-08 | Disposition: A | Discharge status: Home / Self Care | Payer: BC Managed Care – PPO | Source: Ambulatory Visit | Attending: Internal Medicine | Admitting: Internal Medicine

## 2020-09-08 ENCOUNTER — Other Ambulatory Visit: Payer: Self-pay

## 2020-09-08 DIAGNOSIS — C781 Secondary malignant neoplasm of mediastinum: Secondary | ICD-10-CM | POA: Diagnosis present

## 2020-09-08 DIAGNOSIS — C182 Malignant neoplasm of ascending colon: Secondary | ICD-10-CM | POA: Diagnosis present

## 2020-09-08 DIAGNOSIS — C801 Malignant (primary) neoplasm, unspecified: Secondary | ICD-10-CM | POA: Diagnosis present

## 2020-09-08 MED ORDER — IOHEXOL 300 MG/ML  SOLN
100.0000 mL | Freq: Once | INTRAMUSCULAR | Status: AC | PRN
  Administered 2020-09-08: 100 mL via INTRAVENOUS

## 2020-09-13 ENCOUNTER — Inpatient Hospital Stay (HOSPITAL_BASED_OUTPATIENT_CLINIC_OR_DEPARTMENT_OTHER): Payer: BC Managed Care – PPO | Admitting: Nurse Practitioner

## 2020-09-13 ENCOUNTER — Telehealth: Payer: Self-pay | Admitting: *Deleted

## 2020-09-13 ENCOUNTER — Other Ambulatory Visit: Payer: Self-pay | Admitting: *Deleted

## 2020-09-13 ENCOUNTER — Other Ambulatory Visit: Payer: Self-pay

## 2020-09-13 ENCOUNTER — Encounter: Payer: Self-pay | Admitting: Nurse Practitioner

## 2020-09-13 ENCOUNTER — Inpatient Hospital Stay: Payer: BC Managed Care – PPO

## 2020-09-13 VITALS — BP 147/71 | HR 70 | Temp 98.4°F | Resp 18 | Ht 68.0 in | Wt 150.0 lb

## 2020-09-13 DIAGNOSIS — D6481 Anemia due to antineoplastic chemotherapy: Secondary | ICD-10-CM | POA: Diagnosis not present

## 2020-09-13 DIAGNOSIS — T451X5A Adverse effect of antineoplastic and immunosuppressive drugs, initial encounter: Secondary | ICD-10-CM | POA: Diagnosis not present

## 2020-09-13 DIAGNOSIS — R197 Diarrhea, unspecified: Secondary | ICD-10-CM | POA: Diagnosis not present

## 2020-09-13 DIAGNOSIS — C781 Secondary malignant neoplasm of mediastinum: Secondary | ICD-10-CM

## 2020-09-13 DIAGNOSIS — D649 Anemia, unspecified: Secondary | ICD-10-CM

## 2020-09-13 DIAGNOSIS — C801 Malignant (primary) neoplasm, unspecified: Secondary | ICD-10-CM

## 2020-09-13 DIAGNOSIS — E871 Hypo-osmolality and hyponatremia: Secondary | ICD-10-CM

## 2020-09-13 DIAGNOSIS — K921 Melena: Secondary | ICD-10-CM

## 2020-09-13 DIAGNOSIS — C182 Malignant neoplasm of ascending colon: Secondary | ICD-10-CM

## 2020-09-13 LAB — COMPREHENSIVE METABOLIC PANEL WITH GFR
ALT: 14 U/L (ref 0–44)
AST: 17 U/L (ref 15–41)
Albumin: 3.7 g/dL (ref 3.5–5.0)
Alkaline Phosphatase: 57 U/L (ref 38–126)
Anion gap: 9 (ref 5–15)
BUN: 14 mg/dL (ref 8–23)
CO2: 24 mmol/L (ref 22–32)
Calcium: 8.8 mg/dL — ABNORMAL LOW (ref 8.9–10.3)
Chloride: 97 mmol/L — ABNORMAL LOW (ref 98–111)
Creatinine, Ser: 1.17 mg/dL (ref 0.61–1.24)
GFR, Estimated: >60 mL/min
Glucose, Bld: 116 mg/dL — ABNORMAL HIGH (ref 70–99)
Potassium: 4.5 mmol/L (ref 3.5–5.1)
Sodium: 130 mmol/L — ABNORMAL LOW (ref 135–145)
Total Bilirubin: 0.6 mg/dL (ref 0.3–1.2)
Total Protein: 6.8 g/dL (ref 6.5–8.1)

## 2020-09-13 LAB — CBC WITH DIFFERENTIAL/PLATELET
Abs Immature Granulocytes: 0.02 10*3/uL (ref 0.00–0.07)
Basophils Absolute: 0 10*3/uL (ref 0.0–0.1)
Basophils Relative: 0 %
Eosinophils Absolute: 0.6 10*3/uL — ABNORMAL HIGH (ref 0.0–0.5)
Eosinophils Relative: 7 %
HCT: 33.7 % — ABNORMAL LOW (ref 39.0–52.0)
Hemoglobin: 11.3 g/dL — ABNORMAL LOW (ref 13.0–17.0)
Immature Granulocytes: 0 %
Lymphocytes Relative: 22 %
Lymphs Abs: 1.9 10*3/uL (ref 0.7–4.0)
MCH: 29.2 pg (ref 26.0–34.0)
MCHC: 33.5 g/dL (ref 30.0–36.0)
MCV: 87.1 fL (ref 80.0–100.0)
Monocytes Absolute: 0.7 10*3/uL (ref 0.1–1.0)
Monocytes Relative: 9 %
Neutro Abs: 5.4 10*3/uL (ref 1.7–7.7)
Neutrophils Relative %: 62 %
Platelets: 145 10*3/uL — ABNORMAL LOW (ref 150–400)
RBC: 3.87 MIL/uL — ABNORMAL LOW (ref 4.22–5.81)
RDW: 15.7 % — ABNORMAL HIGH (ref 11.5–15.5)
WBC: 8.7 10*3/uL (ref 4.0–10.5)
nRBC: 0 % (ref 0.0–0.2)

## 2020-09-13 LAB — APTT: aPTT: 34 s (ref 24–36)

## 2020-09-13 LAB — PROTIME-INR
INR: 1 (ref 0.8–1.2)
Prothrombin Time: 12.3 s (ref 11.4–15.2)

## 2020-09-13 LAB — MAGNESIUM: Magnesium: 2.2 mg/dL (ref 1.7–2.4)

## 2020-09-13 MED ORDER — HEPARIN SOD (PORK) LOCK FLUSH 100 UNIT/ML IV SOLN
500.0000 [IU] | Freq: Once | INTRAVENOUS | Status: AC
  Administered 2020-09-13: 500 [IU] via INTRAVENOUS
  Filled 2020-09-13: qty 5

## 2020-09-13 MED ORDER — SODIUM CHLORIDE 0.9% FLUSH
10.0000 mL | INTRAVENOUS | Status: AC | PRN
  Administered 2020-09-13: 10 mL via INTRAVENOUS
  Filled 2020-09-13: qty 10

## 2020-09-13 MED ORDER — SODIUM CHLORIDE 0.9 % IV SOLN
Freq: Once | INTRAVENOUS | Status: AC
  Filled 2020-09-13: qty 250

## 2020-09-13 NOTE — Telephone Encounter (Signed)
Call receive from [facility] reporting that patient told them that he has had bloody watery diarrhea for 8 days and he is very weak. They asked him to go to the ER, but he refused.He does have an appointment with Dr B tomorrow. Please advise

## 2020-09-13 NOTE — Telephone Encounter (Signed)
Patient is already scheduled for 930 am

## 2020-09-13 NOTE — Progress Notes (Signed)
[facility] [place]  [phone] [phone]  Patient Care Team: Laneta Simmers, NP as PCP - General (Nurse Practitioner) Telford Nab, RN as Registered Nurse Clent Jacks, RN as Oncology Nurse Navigator   Name of the patient: Kevin Mckay  [id number]  February 05, 1951   Date of visit: 09/13/20  Diagnosis- Colon Cancer   Chief complaint/ Reason for visit- rectal bleeding & dizziness  Heme/Onc history:  Oncology History Overview Note  # July 2020- SMALL CELL CA METASTATIC TO MEDIASTINAL LN [open Bx; Dr.Oaks]; TxN2M0; July 2nd 2020-PET- 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  MRI brain negative  # aug 17th 2020-carbo etoposide-RT [? 8/19]; #4 cycle carbo-Etop [finished dec 30th,2020]  #August 2020 iron deficient anemia-question etiology; IV Feraheme [no colonoscopy]  # OCT 2020- colo/ Cecal adeno ca; MRI liver- 1 cm enhancing lesion "metastasis" [Dr.Anna/Dr.Cannon]-difficulty biopsy.  November 02-2019 right hemicolectomy- STAGE III [pT3pN1 (2/13LN)]- HOLD adjuvant therapy. MMR- INTACT/LOW  #May 2021-liver biopsy-possible adenocarcinoma; colorectal origin.  Stage IV colon cancer  #May 24-2021-FOLFOX with Avastin; AUG 31st,2021- STABLE liver lesion; SEP 1st, 2021- Discontinue LV+Ox sec to severe fatigue/PN; cont 5FU CIV + Avastin  # COPD/  DM-2- on OHA/ smoker/ PVD/peripheral neuropathy  # History of alcohol abuse/quit 2014/ Hx of abdominal trauma [at 21y]  #July 2021-foundation 1 NGS [liver metastases]- RAS WILD TYPE **  # Jan 1st, 2022-Covid infection [prior vaccination; monoclonal infusion-not hospitalized]  # DIAGNOSIS:   # SMALL CELL CA-limited stage-status post chemoradiation;   # colon cancer-stage IV-solitary liver lesion  GOALS: Control  CURRENT/MOST RECENT THERAPY : FOLFOX with Avastin   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 ([place])   01/29/2019 Initial Diagnosis   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 [facility])   02/09/2019 - 06/24/2019 Chemotherapy   The patient had palonosetron (ALOXI) injection 0.25 mg, 0.25 mg, Intravenous,  Once, 4 of 4 cycles Administration: 0.25 mg (02/09/2019), 0.25 mg (03/03/2019), 0.25 mg (06/02/2019), 0.25 mg (06/22/2019) CARBOplatin (PARAPLATIN) 410 mg in sodium chloride 0.9 % 250 mL chemo infusion, 410 mg (100 % of original dose 414 mg), Intravenous,  Once, 4 of 4 cycles Dose modification:   (original dose 414 mg, Cycle 1) Administration: 410 mg (02/09/2019), 410 mg (03/03/2019), 410 mg (06/02/2019), 410 mg (06/22/2019) etoposide (VEPESID) 180 mg in sodium chloride 0.9 % 500 mL chemo infusion, 100 mg/m2 = 180 mg, Intravenous,  Once, 4 of 4 cycles Administration: 180 mg (02/09/2019), 180 mg (02/10/2019), 180 mg (02/11/2019), 180 mg (03/03/2019), 180 mg (03/04/2019), 180 mg (03/05/2019), 180 mg (06/02/2019), 180 mg (06/03/2019), 180 mg (06/04/2019), 180 mg (06/22/2019), 180 mg (06/23/2019), 180 mg (06/24/2019) fosaprepitant (EMEND) 150 mg, dexamethasone (DECADRON) 6 mg in sodium chloride 0.9 % 145 mL IVPB, , Intravenous,  Once, 2 of 2 cycles Administration:  (06/02/2019),  (06/22/2019)  for chemotherapy treatment.    Cancer of ascending colon ([place])  05/04/2019 Initial Diagnosis   Cancer of ascending colon ([place])   11/30/2019 -  Chemotherapy    Patient is on Treatment Plan: COLORECTAL FOLFOX + BEVACIZUMAB Q14D        Interval history- Patient is 70 year old male diagnosed with colon cancer currently undergoing folfox + avastin chemotherapy who [facility] for complaints of bloody diarrhea. Symptoms started following his most recent chemotherapy treated. Describes 2 episodes of bright red bloody diarrhea. Blood then resolved and he had multiple episodes and days of diarrhea described as watery. Stool  has now returned to solid, normal form and color. He  has diffuse abdominal tenderness. Feels dizzy, weak. Had a bloody nose once. No abnormal bruising.   Review of systems- Review of Systems  Constitutional: Positive for malaise/fatigue. Negative for chills, fever and weight loss.  HENT: Negative for hearing loss, nosebleeds, sore throat and tinnitus.   Eyes: Negative for blurred vision and double vision.  Respiratory: Negative for cough, hemoptysis, shortness of breath and wheezing.   Cardiovascular: Negative for chest pain, palpitations and leg swelling.  Gastrointestinal: Positive for blood in stool. Negative for abdominal pain, constipation, diarrhea, melena, nausea and vomiting.  Genitourinary: Negative for dysuria and urgency.  Musculoskeletal: Negative for back pain, falls, joint pain and myalgias.  Skin: Negative for itching and rash.  Neurological: Positive for weakness. Negative for dizziness, tingling, sensory change, loss of consciousness and headaches.  Endo/Heme/Allergies: Negative for environmental allergies. Does not bruise/bleed easily.  Psychiatric/Behavioral: Negative for depression. The patient is not nervous/anxious and does not have insomnia.       Allergies  Allergen Reactions  . Ferumoxytol Shortness Of Breath and Other (See Comments)    Infusion reaction-shortness of breath/ tachycardia/hypotension.  Marland Kitchen Amoxicillin Hives    Did it involve swelling of the face/tongue/throat, SOB, or low BP? No Did it involve sudden or severe rash/hives, skin peeling, or any reaction on the inside of your mouth or nose? Yes Did you need to seek medical attention at a hospital or doctor's office? No When did it last happen?~5 years ago If all above answers are "NO", may proceed with cephalosporin use.   . Codeine Nausea Only    Past Medical History:  Diagnosis Date  . Asthma   . Cancer ([place])    Metastatic small cell lung cancer  . Cerebral aneurysm   . Chronic painful diabetic neuropathy ([place])   . Depression   . Diabetes  mellitus without complication ([place])   . Essential hypertension   . History of kidney stones   . Occasional tremors   . Tobacco use     Past Surgical History:  Procedure Laterality Date  . ABDOMINAL SURGERY     age 74. trauma surgery due to forklift injury- liver and spleen  . COLONOSCOPY WITH PROPOFOL N/A 03/27/2019   Procedure: COLONOSCOPY WITH PROPOFOL;  Surgeon: Jonathon Bellows, MD;  Location: [facility] ENDOSCOPY;  Service: Gastroenterology;  Laterality: N/A;  . COLOSTOMY REVISION Right 05/04/2019   Procedure: COLON RESECTION RIGHT-right hemicolectomy-open;  Surgeon: Fredirick Maudlin, MD;  Location: [facility] ORS;  Service: General;  Laterality: Right;  . ESOPHAGOGASTRODUODENOSCOPY (EGD) WITH PROPOFOL N/A 03/27/2019   Procedure: ESOPHAGOGASTRODUODENOSCOPY (EGD) WITH PROPOFOL;  Surgeon: Jonathon Bellows, MD;  Location: [facility] ENDOSCOPY;  Service: Gastroenterology;  Laterality: N/A;  . IR GENERIC HISTORICAL  05/31/2016   IR ANGIO VERTEBRAL SEL VERTEBRAL BILAT MOD SED 05/31/2016 Consuella Lose, MD [facility]-INTERV RAD  . IR GENERIC HISTORICAL  05/31/2016   IR ANGIO INTRA EXTRACRAN SEL INTERNAL CAROTID BILAT MOD SED 05/31/2016 Consuella Lose, MD [facility]INTER[facility] RAD  . PORTACATH PLACEMENT Right 02/04/2019   Procedure: INSERTION PORT-A-CATH;  Surgeon: [name], MD;  Location: [facility] ORS;  Service: General;  Laterality: Right;  . THORACOTOMY Left 01/22/2019   Procedure: PRE OP BRONCH LEFT ANTERIOR THORACOTOMY WITH BIOPSY OF MEDIASTINAL MASS;  Surgeon: [name], MD;  Location: [facility] ORS;  Service: General;  Laterality: Left;    Social History   Socioeconomic History  . Marital status: Married    Spouse name: Not on file  . Number of  children: Not on file  . Years of education: Not on file  . Highest education level: Not on file  Occupational History  . Not on file  Tobacco Use  . Smoking status: Current Every Day Smoker    Packs/day: 0.50    Years: 50.00    Pack years: 25.00    Types: Cigarettes  . Smokeless  tobacco: Never Used  Vaping Use  . Vaping Use: Every day  . Substances: Nicotine  Substance and Sexual Activity  . Alcohol use: No  . Drug use: Yes    Types: Barbituates, Marijuana  . Sexual activity: Not on file  Other Topics Concern  . Not on file  Social History Narrative   Lives in [place]; wife/ son/grandson [custody]; smoker; vending business; hx of alcoholism- quit at 69.    Social Determinants of Health   Financial Resource Strain: Not on file  Food Insecurity: Not on file  Transportation Needs: Not on file  Physical Activity: Not on file  Stress: Not on file  Social Connections: Not on file  Intimate Partner Violence: Not on file    Family History  Problem Relation Age of Onset  . Lymphoma Father   . Liver cancer Paternal Uncle   . Lung cancer Paternal Uncle   . Lung cancer Maternal Uncle      Current Outpatient Medications:  .  amLODipine (NORVASC) 10 MG tablet, Take 10 mg by mouth every morning. , Disp: , Rfl:  .  budesonide-formoterol (SYMBICORT) 160-4.5 MCG/ACT inhaler, Inhale 2 puffs into the lungs 2 (two) times daily., Disp: , Rfl:  .  DULoxetine (CYMBALTA) 30 MG capsule, Take 30 mg by mouth every morning. , Disp: , Rfl:  .  gabapentin (NEURONTIN) 800 MG tablet, Take 800 mg by mouth 3 (three) times daily., Disp: , Rfl:  .  HYDROcodone-acetaminophen (NORCO/VICODIN) 5-325 MG tablet, Tale one tablet a day as needed for pain., Disp: 45 tablet, Rfl: 0 .  insulin glargine (LANTUS) 100 UNIT/ML injection, Inject into the skin daily. Units based By sliding scale, Disp: , Rfl:  .  Ipratropium-Albuterol (COMBIVENT) 20-100 MCG/ACT AERS respimat, Inhale into the lungs., Disp: , Rfl:  .  lidocaine-prilocaine (EMLA) cream, Apply 1 application topically as needed. 30-45 mins prior to port access., Disp: 30 g, Rfl: 0 .  linaclotide (LINZESS) 145 MCG CAPS capsule, Take 145 mcg by mouth daily before breakfast., Disp: , Rfl:  .  lisinopril-hydrochlorothiazide (ZESTORETIC)  10-12.5 MG tablet, Take 1 tablet by mouth daily., Disp: , Rfl:  .  lovastatin (MEVACOR) 20 MG tablet, Take 20 mg by mouth every evening., Disp: , Rfl:  .  metFORMIN (GLUCOPHAGE) 1000 MG tablet, Take 1,000 mg by mouth 2 (two) times daily with a meal. , Disp: , Rfl:  .  metFORMIN (GLUCOPHAGE-XR) 500 MG 24 hr tablet, Take 1,000 mg by mouth 2 (two) times daily., Disp: , Rfl:  .  omeprazole (PRILOSEC) 40 MG capsule, Take 1 capsule (40 mg total) by mouth daily., Disp: 90 capsule, Rfl: 1 .  ondansetron (ZOFRAN) 8 MG tablet, Take 1 tablet (8 mg total) by mouth every 8 (eight) hours as needed for nausea or vomiting (start 3 days; after chemo)., Disp: 40 tablet, Rfl: 1 .  polyethylene glycol powder (MIRALAX) 17 GM/SCOOP powder, Mix full container in 64 ounces of Gatorade or other clear liquid. NO RED Liquids, Disp: 238 g, Rfl: 0 .  prazosin (MINIPRESS) 1 MG capsule, Take 1 mg by mouth at bedtime., Disp: , Rfl:  .  pyridOXINE (VITAMIN B-6) 100 MG tablet, Take 100 mg by mouth daily., Disp: , Rfl:  .  sitaGLIPtin (JANUVIA) 25 MG tablet, Take 25 mg by mouth daily., Disp: , Rfl:  .  vitamin B-12 (CYANOCOBALAMIN) 100 MCG tablet, Take 100 mcg by mouth daily., Disp: , Rfl:  No current facility-administered medications for this visit.  Facility-Administered Medications Ordered in Other Visits:  .  heparin lock flush 100 unit/mL, 500 Units, Intravenous, Once, Brahmanday, Govinda R, MD .  sodium chloride flush (NS) 0.9 % injection 10 mL, 10 mL, Intravenous, PRN, Charlaine Dalton R, MD, 10 mL at 09/13/20 0943  Physical exam:  Vitals:   09/13/20 0935  BP: (!) 147/71  Pulse: 70  Resp: 18  Temp: 98.4 F (36.9 C)  TempSrc: Tympanic  Weight: 150 lb (68 kg)  Height: '5\' 8"'  (1.727 m)   Physical Exam Constitutional:      General: He is not in acute distress.    Appearance: He is well-developed. He is ill-appearing.  HENT:     Head: Normocephalic and atraumatic.     Nose: Nose normal.     Mouth/Throat:      Pharynx: No oropharyngeal exudate.  Eyes:     General: No scleral icterus.    Conjunctiva/sclera: Conjunctivae normal.  Cardiovascular:     Rate and Rhythm: Normal rate and regular rhythm.     Heart sounds: Normal heart sounds.  Pulmonary:     Effort: Pulmonary effort is normal.     Breath sounds: Normal breath sounds. No wheezing.  Abdominal:     General: Bowel sounds are normal. There is no distension.     Palpations: Abdomen is soft.     Tenderness: There is no abdominal tenderness. There is no guarding.  Musculoskeletal:        General: Normal range of motion.     Cervical back: Normal range of motion and neck supple.  Skin:    General: Skin is warm and dry.  Neurological:     Mental Status: He is alert and oriented to person, place, and time.     Motor: No weakness.  Psychiatric:        Mood and Affect: Mood normal.        Behavior: Behavior normal.      CMP Latest Ref Rng & Units 08/31/2020  Glucose 70 - 99 mg/dL 263(H)  BUN 8 - 23 mg/dL 17  Creatinine 0.61 - 1.24 mg/dL 1.27(H)  Sodium 135 - 145 mmol/L 134(L)  Potassium 3.5 - 5.1 mmol/L 4.4  Chloride 98 - 111 mmol/L 100  CO2 22 - 32 mmol/L 24  Calcium 8.9 - 10.3 mg/dL 9.0  Total Protein 6.5 - 8.1 g/dL 7.3  Total Bilirubin 0.3 - 1.2 mg/dL 0.9  Alkaline Phos 38 - 126 U/L 53  AST 15 - 41 U/L 20  ALT 0 - 44 U/L 16   CBC Latest Ref Rng & Units 09/13/2020  WBC 4.0 - 10.5 K/uL 8.7  Hemoglobin 13.0 - 17.0 g/dL 11.3(L)  Hematocrit 39.0 - 52.0 % 33.7(L)  Platelets 150 - 400 K/uL 145(L)    No images are attached to the encounter.  CT CHEST ABDOMEN PELVIS W CONTRAST  Result Date: 09/08/2020 CLINICAL DATA:  Small-cell lung cancer, synchronous colorectal cancer, assess treatment response EXAM: CT CHEST, ABDOMEN, AND PELVIS WITH CONTRAST TECHNIQUE: Multidetector CT imaging of the chest, abdomen and pelvis was performed following the standard protocol during bolus administration of intravenous contrast. CONTRAST:  121m  OMNIPAQUE IOHEXOL  300 MG/ML  SOLN COMPARISON:  Multiple priors including most recent CT chest abdomen and pelvis June 07, 2020. FINDINGS: CT CHEST FINDINGS Cardiovascular: Right chest Port-A-Cath with tip at the superior cavoatrial junction. Aortic atherosclerosis. No thoracic aortic aneurysm. No central pulmonary embolus. Normal size heart. Four-vessel coronary artery calcifications. No significant pericardial effusion/thickening. Mediastinum/Nodes: Similar appearance of the soft tissue about the AP window on image 23/2, the site of previously FDG avid lymph node. No discretely enlarged mediastinal, hilar or axillary lymph nodes. Thyroid gland is within normal limits without discrete nodularity., the trachea and esophagus are unremarkable. Lungs/Pleura: Similar post treatment appearance of a suprahilar mass in the left upper lobe with associated architectural distortion and fibrosis on image 57/4. The background innumerable tiny ground-glass centrilobular pulmonary nodules is unchanged compared to prior examinations. No pleural effusion. No pneumothorax. Musculoskeletal: No chest wall mass or suspicious bone lesions identified. CT ABDOMEN PELVIS FINDINGS Hepatobiliary: Interval decrease in size, conspicuity and associated peripheral enhancement associated with the biopsy-proven subcapsular segment VII lesion in the lateral hepatic dome which now measures 9 x 7 mm on image 48/2 previously 11 x 11 mm. No new suspicious hepatic lesions. Hepatic steatosis. Gallbladder is unremarkable. No biliary ductal dilation. Pancreas: Unremarkable. No pancreatic ductal dilatation or surrounding inflammatory changes. Spleen: Normal in size without focal abnormality. Adrenals/Urinary Tract: Adrenal glands are unremarkable. Kidneys are normal, without renal calculi, solid enhancing lesion, or hydronephrosis. Bladder is unremarkable degree of distension. Stomach/Bowel: Similar changes of partial right colectomy and reanastomosis.  Radiopaque enteric contrast visualized to the distal small bowel. Stomach is grossly unremarkable. No suspicious small bowel wall thickening or dilation. Descending and sigmoid colonic diverticulosis without findings of acute diverticulitis. Vascular/Lymphatic: Aortic atherosclerosis. Left upper quadrant gastrosplenic varices again visualized. No pathologically enlarged abdominal or pelvic lymph nodes. Reproductive: Prostate is unremarkable. Other: No abdominopelvic ascites. No overt peritoneal or omental nodularity. Musculoskeletal: Multilevel degenerative change of the spine. No suspicious lytic or blastic lesion of bone. IMPRESSION: 1. Interval decrease in size, conspicuity and associated peripheral enhancement associated with the biopsy-proven subcapsular segment VII lesion, consistent with treatment response. No new suspicious hepatic lesions. 2. Similar appearance of the ill-defined soft tissue about the AP window, at the site of previously FDG avid lymph node. No overtly enlarged mediastinal, hilar, or axillary lymph nodes. 3. Unchanged post treatment appearance of a suprahilar mass in the left upper lobe with associated architectural distortion and fibrosis. 4. No evidence of new metastatic disease in the chest abdomen or pelvis. 5. Hepatic steatosis. 6. Descending and sigmoid colonic diverticulosis without findings of acute diverticulitis. 7. Aortic atherosclerosis.  Coronary artery calcifications. Aortic Atherosclerosis (ICD10-I70.0). Electronically Signed   By: Dahlia Bailiff MD   On: 09/08/2020 16:20    Assessment and plan- Patient is a 70 y.o. male diagnosed with stage IV right colon cancer with known liver metastasis who presents to [facility] for blood in stool.   1. Bloody diarrhea- likely secondary to avastin. Infectious etiology less likely given that symptoms have now resolved. Labs today stable. Discussed with Dr. Rogue Bussing who feels in view of improved ct scan, plan to hold avastin. If symptoms  recur, plan for colonoscopy. Patient to notify us.  Will plan for 1 liter iv fluids in clinic today given fluid losses but encouraged hydration at home as tolerated.   2. Anemia- hemoglobin 11.3. Stable and consistent with previous studies.   Follow up with Dr. Rogue Bussing as scheduled or sooner if symptoms recur.    Visit Diagnosis 1. Bloody  diarrhea   2. Anemia due to antineoplastic chemotherapy     Patient expressed understanding and was in agreement with this plan. He also understands that He can call clinic at any time with any questions, concerns, or complaints.   Thank you for allowing me to participate in the care of this very pleasant patient.   Beckey Rutter, DNP, AGNP-C [place] [facility] [place]

## 2020-09-13 NOTE — Telephone Encounter (Signed)
Please contact patient to see if he can be seen in [facility] with labs this morning.

## 2020-09-13 NOTE — Progress Notes (Signed)
Very pleasant gentleman received 1 liter of IV hydration today. Stable at time of discharge to home.

## 2020-09-14 ENCOUNTER — Inpatient Hospital Stay: Payer: BC Managed Care – PPO

## 2020-09-14 ENCOUNTER — Inpatient Hospital Stay: Payer: BC Managed Care – PPO | Admitting: Internal Medicine

## 2020-09-14 ENCOUNTER — Encounter: Payer: Self-pay | Admitting: Internal Medicine

## 2020-09-14 DIAGNOSIS — C182 Malignant neoplasm of ascending colon: Secondary | ICD-10-CM

## 2020-09-14 LAB — CEA: CEA: 3.6 ng/mL (ref 0.0–4.7)

## 2020-09-14 NOTE — Assessment & Plan Note (Addendum)
#  Right colon cancer stage IV- liver metastases; adenocarcinoma; on 5FU + avastin; CT MARCH 16th, 2022-continued partial response of the liver lesion; no new liver lesions noted.  Stable mediastinal adenopathy; left suprahilar mass stable.  # HOLD chemo today 5FU with Avastin;  Labs today reviewed- sec to blood diarrrhea [see below]  # Bloody diarrhea [up to 2 a day]/abdominal cramping- question secondary to Avastin-currently resolved; CT scan- A/P-NEG. Colonoscopy-oct 2020 [Dr.Anna]; hold chemotherapy especially given the improved CT scan.  If recurs recommend colonoscopy.  Patient call us.  # Anemia- secondary- sec to chemo-hemoglobin on 11-12-STABLE.   #Hyponatremia sodium 130-question dehydration/diarrhea-continue p.o. fluid intake.  # Back pain- MRI lumbar spine- July 2021-NEG for mets; severe arthritic changes; spinal canal stenosis-stable hydrocodone 1 day prn.  # Poorly controlled diabetes blood sugars-better controlled as per patients.; over all stable;  FBG-116 STABLE.   # LEFT hilar/LUL-  Limited stage small cell cancer-s/p chemo-RT; CT scan chest February 23, 2020-LUL radiation changes- STABLE;DEC 12th, 2021CT STABLE. .    # HTN- 140s/ 70-on Avastin-stable continue current anti-HTNs.   # DISPOSITION:  # follow up in 4 weeks-MD; labs-cbc/cmp/cea; UA; 5FU + avastin; D-3 pump de-access; -Dr.B   # I reviewed the blood work- with the patient in detail; also reviewed the imaging independently [as summarized above]; and with the patient in detail.

## 2020-09-14 NOTE — Progress Notes (Signed)
[facility] NOTE  Patient Care Team: Laneta Simmers, NP as PCP - General (Nurse Practitioner) Telford Nab, RN as Registered Nurse Clent Jacks, RN as Oncology Nurse Navigator  CHIEF COMPLAINTS/PURPOSE OF CONSULTATION: Lung cancer/colon cancer   Oncology History Overview Note  # July 2020- SMALL CELL CA METASTATIC TO MEDIASTINAL LN [open Bx; Dr.Oaks]; TxN2M0; July 2nd 2020-PET- 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  MRI brain negative  # aug 17th 2020-carbo etoposide-RT [? 8/19]; #4 cycle carbo-Etop [finished dec 30th,2020]  #August 2020 iron deficient anemia-question etiology; IV Feraheme [no colonoscopy]  # OCT 2020- colo/ Cecal adeno ca; MRI liver- 1 cm enhancing lesion "metastasis" [Dr.Anna/Dr.Cannon]-difficulty biopsy.  November 02-2019 right hemicolectomy- STAGE III [pT3pN1 (2/13LN)]- HOLD adjuvant therapy. MMR- INTACT/LOW  #May 2021-liver biopsy-possible adenocarcinoma; colorectal origin.  Stage IV colon cancer  #May 24-2021-FOLFOX with Avastin; AUG 31st,2021- STABLE liver lesion; SEP 1st, 2021- Discontinue LV+Ox sec to severe fatigue/PN; cont 5FU CIV + Avastin  # COPD/  DM-2- on OHA/ smoker/ PVD/peripheral neuropathy  # History of alcohol abuse/quit 2014/ Hx of abdominal trauma [at 21y]  #July 2021-foundation 1 NGS [liver metastases]- RAS WILD TYPE **  # Jan 1st, 2022-Covid infection [prior vaccination; monoclonal infusion-not hospitalized]  # DIAGNOSIS:   # SMALL CELL CA-limited stage-status post chemoradiation;   # colon cancer-stage IV-solitary liver lesion  GOALS: Control  CURRENT/MOST RECENT THERAPY : FOLFOX with Avastin   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 ([place])  01/29/2019 Initial Diagnosis   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 [facility])   02/09/2019 - 06/24/2019 Chemotherapy   The patient had palonosetron (ALOXI) injection 0.25  mg, 0.25 mg, Intravenous,  Once, 4 of 4 cycles Administration: 0.25 mg (02/09/2019), 0.25 mg (03/03/2019), 0.25 mg (06/02/2019), 0.25 mg (06/22/2019) CARBOplatin (PARAPLATIN) 410 mg in sodium chloride 0.9 % 250 mL chemo infusion, 410 mg (100 % of original dose 414 mg), Intravenous,  Once, 4 of 4 cycles Dose modification:   (original dose 414 mg, Cycle 1) Administration: 410 mg (02/09/2019), 410 mg (03/03/2019), 410 mg (06/02/2019), 410 mg (06/22/2019) etoposide (VEPESID) 180 mg in sodium chloride 0.9 % 500 mL chemo infusion, 100 mg/m2 = 180 mg, Intravenous,  Once, 4 of 4 cycles Administration: 180 mg (02/09/2019), 180 mg (02/10/2019), 180 mg (02/11/2019), 180 mg (03/03/2019), 180 mg (03/04/2019), 180 mg (03/05/2019), 180 mg (06/02/2019), 180 mg (06/03/2019), 180 mg (06/04/2019), 180 mg (06/22/2019), 180 mg (06/23/2019), 180 mg (06/24/2019) fosaprepitant (EMEND) 150 mg, dexamethasone (DECADRON) 6 mg in sodium chloride 0.9 % 145 mL IVPB, , Intravenous,  Once, 2 of 2 cycles Administration:  (06/02/2019),  (06/22/2019)  for chemotherapy treatment.    Cancer of ascending colon ([place])  05/04/2019 Initial Diagnosis   Cancer of ascending colon ([place])   11/30/2019 -  Chemotherapy    Patient is on Treatment Plan: COLORECTAL FOLFOX + BEVACIZUMAB Q14D       HISTORY OF PRESENTING ILLNESS:   Kevin Mckay 70 y.o.  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  Patient denies any new headaches.  Denies any new shortness of breath or cough.  Chronic back pain for which he takes hydrocodone as needed.  Mild tingling and numbness.  Not any worse.   Review of Systems  Constitutional: Positive for malaise/fatigue and weight loss. Negative for chills, diaphoresis and fever.  HENT: Negative for nosebleeds and sore throat.   Eyes: Negative for double vision.  Respiratory: Positive  for shortness of breath. Negative for hemoptysis and wheezing.   Cardiovascular: Negative for chest pain, palpitations and orthopnea.  Gastrointestinal: Negative for abdominal pain, blood in stool, constipation, diarrhea, heartburn, melena, nausea and vomiting.  Genitourinary: Negative for dysuria, frequency and urgency.  Musculoskeletal: Positive for back pain and joint pain.  Skin: Negative.  Negative for itching and rash.  Neurological: Positive for tingling. Negative for dizziness, focal weakness and weakness.  Endo/Heme/Allergies: Does not bruise/bleed easily.  Psychiatric/Behavioral: Negative for depression. The patient is not nervous/anxious and does not have insomnia.      MEDICAL HISTORY:  Past Medical History:  Diagnosis Date  . Asthma   . Cancer ([place])    Metastatic small cell lung cancer  . Cerebral aneurysm   . Chronic painful diabetic neuropathy ([place])   . Depression   . Diabetes mellitus without complication ([place])   . Essential hypertension   . History of kidney stones   . Occasional tremors   . Tobacco use     SURGICAL HISTORY: Past Surgical History:  Procedure Laterality Date  . ABDOMINAL SURGERY     age 14. trauma surgery due to forklift injury- liver and spleen  . COLONOSCOPY WITH PROPOFOL N/A 03/27/2019   Procedure: COLONOSCOPY WITH PROPOFOL;  Surgeon: Jonathon Bellows, MD;  Location: [facility] ENDOSCOPY;  Service: Gastroenterology;  Laterality: N/A;  . COLOSTOMY REVISION Right 05/04/2019   Procedure: COLON RESECTION RIGHT-right hemicolectomy-open;  Surgeon: Fredirick Maudlin, MD;  Location: [facility] ORS;  Service: General;  Laterality: Right;  . ESOPHAGOGASTRODUODENOSCOPY (EGD) WITH PROPOFOL N/A 03/27/2019   Procedure: ESOPHAGOGASTRODUODENOSCOPY (EGD) WITH PROPOFOL;  Surgeon: Jonathon Bellows, MD;  Location: [facility] ENDOSCOPY;  Service: Gastroenterology;  Laterality: N/A;  . IR GENERIC HISTORICAL  05/31/2016   IR ANGIO VERTEBRAL SEL VERTEBRAL BILAT MOD SED 05/31/2016 Consuella Lose,  MD [facility]-INTERV RAD  . IR GENERIC HISTORICAL  05/31/2016   IR ANGIO INTRA EXTRACRAN SEL INTERNAL CAROTID BILAT MOD SED 05/31/2016 Consuella Lose, MD [facility]INTER[facility]  . PORTACATH PLACEMENT Right 02/04/2019   Procedure: INSERTION PORT-A-CATH;  Surgeon: [name], MD;  Location: [facility] ORS;  Service: General;  Laterality: Right;  . THORACOTOMY Left 01/22/2019   Procedure: PRE OP BRONCH LEFT ANTERIOR THORACOTOMY WITH BIOPSY OF MEDIASTINAL MASS;  Surgeon: [name], MD;  Location: [facility] ORS;  Service: General;  Laterality: Left;    SOCIAL HISTORY: Social History   Socioeconomic History  . Marital status: Married    Spouse name: Not on file  . Number of children: Not on file  . Years of education: Not on file  . Highest education level: Not on file  Occupational History  . Not on file  Tobacco Use  . Smoking status: Current Every Day Smoker    Packs/day: 0.50    Years: 50.00    Pack years: 25.00    Types: Cigarettes  . Smokeless tobacco: Never Used  Vaping Use  . Vaping Use: Every day  . Substances: Nicotine  Substance and Sexual Activity  . Alcohol use: No  . Drug use: Yes    Types: Barbituates, Marijuana  . Sexual activity: Not on file  Other Topics Concern  . Not on file  Social History Narrative   Lives in [place]; wife/ son/grandson [custody]; smoker; vending business; hx of alcoholism- quit at 43.    Social Determinants of Health   Financial Resource Strain: Not on file  Food Insecurity: Not on  file  Transportation Needs: Not on file  Physical Activity: Not on file  Stress: Not on file  Social Connections: Not on file  Intimate Partner Violence: Not on file    FAMILY HISTORY: Family History  Problem Relation Age of Onset  . Lymphoma Father   . Liver cancer Paternal Uncle   . Lung cancer Paternal Uncle   . Lung cancer Maternal Uncle     ALLERGIES:  is allergic to ferumoxytol, amoxicillin, and codeine.  MEDICATIONS:  Current Outpatient Medications   Medication Sig Dispense Refill  . amLODipine (NORVASC) 10 MG tablet Take 10 mg by mouth every morning.     . budesonide-formoterol (SYMBICORT) 160-4.5 MCG/ACT inhaler Inhale 2 puffs into the lungs 2 (two) times daily.    . DULoxetine (CYMBALTA) 30 MG capsule Take 30 mg by mouth every morning.     . gabapentin (NEURONTIN) 800 MG tablet Take 800 mg by mouth 3 (three) times daily.    Marland Kitchen HYDROcodone-acetaminophen (NORCO/VICODIN) 5-325 MG tablet Tale one tablet a day as needed for pain. 45 tablet 0  . insulin glargine (LANTUS) 100 UNIT/ML injection Inject into the skin daily. Units based By sliding scale    . lidocaine-prilocaine (EMLA) cream Apply 1 application topically as needed. 30-45 mins prior to port access. 30 g 0  . linaclotide (LINZESS) 145 MCG CAPS capsule Take 145 mcg by mouth daily before breakfast.    . lisinopril-hydrochlorothiazide (ZESTORETIC) 10-12.5 MG tablet Take 1 tablet by mouth daily.    Marland Kitchen lovastatin (MEVACOR) 20 MG tablet Take 20 mg by mouth every evening.    . metFORMIN (GLUCOPHAGE) 1000 MG tablet Take 1,000 mg by mouth 2 (two) times daily with a meal.     . metFORMIN (GLUCOPHAGE-XR) 500 MG 24 hr tablet Take 1,000 mg by mouth 2 (two) times daily.    Marland Kitchen omeprazole (PRILOSEC) 40 MG capsule Take 1 capsule (40 mg total) by mouth daily. 90 capsule 1  . ondansetron (ZOFRAN) 8 MG tablet Take 1 tablet (8 mg total) by mouth every 8 (eight) hours as needed for nausea or vomiting (start 3 days; after chemo). 40 tablet 1  . polyethylene glycol powder (MIRALAX) 17 GM/SCOOP powder Mix full container in 64 ounces of Gatorade or other clear liquid. NO RED Liquids 238 g 0  . prazosin (MINIPRESS) 1 MG capsule Take 1 mg by mouth at bedtime.    . pyridOXINE (VITAMIN B-6) 100 MG tablet Take 100 mg by mouth daily.    . sitaGLIPtin (JANUVIA) 25 MG tablet Take 25 mg by mouth daily.    . vitamin B-12 (CYANOCOBALAMIN) 100 MCG tablet Take 100 mcg by mouth daily.    . Ipratropium-Albuterol (COMBIVENT)  20-100 MCG/ACT AERS respimat Inhale into the lungs.     No current facility-administered medications for this visit.   Facility-Administered Medications Ordered in Other Visits  Medication Dose Route Frequency Provider Last Rate Last Admin  . sodium chloride flush (NS) 0.9 % injection 10 mL  10 mL Intravenous PRN Cammie Sickle, MD   10 mL at 09/13/20 0943      .  PHYSICAL EXAMINATION: ECOG PERFORMANCE STATUS: 0 - Asymptomatic  Vitals:   09/14/20 0831  BP: 140/68  Pulse: 72  Resp: 16  Temp: (!) 96.6 F (35.9 C)  SpO2: 98%   Filed Weights   09/14/20 0831  Weight: 152 lb 12.8 oz (69.3 kg)    Physical Exam HENT:     Head: Normocephalic and atraumatic.  Mouth/Throat:     Pharynx: No oropharyngeal exudate.  Eyes:     Pupils: Pupils are equal, round, and reactive to light.  Cardiovascular:     Rate and Rhythm: Normal rate and regular rhythm.  Pulmonary:     Effort: No respiratory distress.     Breath sounds: No wheezing.  Abdominal:     General: Bowel sounds are normal. There is no distension.     Palpations: Abdomen is soft. There is no mass.     Tenderness: There is no abdominal tenderness. There is no guarding or rebound.     Comments: Abdominal incision well-healed.  Musculoskeletal:        General: No tenderness. Normal range of motion.     Cervical back: Normal range of motion and neck supple.  Skin:    General: Skin is warm.  Neurological:     Mental Status: He is alert and oriented to person, place, and time.  Psychiatric:        Mood and Affect: Affect normal.     LABORATORY DATA:  I have reviewed the data as listed Lab Results  Component Value Date   WBC 8.7 09/13/2020   HGB 11.3 (L) 09/13/2020   HCT 33.7 (L) 09/13/2020   MCV 87.1 09/13/2020   PLT 145 (L) 09/13/2020   Recent Labs    02/24/20 0819 03/09/20 0813 03/23/20 0850 04/06/20 0813 08/17/20 0837 08/31/20 0821 09/13/20 0930  NA 134* 132* 136   < > 133* 134* 130*  K 4.1  3.9 3.6   < > 4.5 4.4 4.5  CL 98 97* 100   < > 98 100 97*  CO2 '26 25 25   ' < > '26 24 24  ' GLUCOSE 196* 260* 170*   < > 138* 263* 116*  BUN 33* 16 17   < > '17 17 14  ' CREATININE 0.96 0.96 0.97   < > 1.21 1.27* 1.17  CALCIUM 8.6* 8.6* 9.1   < > 8.9 9.0 8.8*  GFRNONAA >60 >60 >60   < > >60 >60 >60  GFRAA >60 >60 >60  --   --   --   --   PROT 6.8 6.8 6.9   < > 6.9 7.3 6.8  ALBUMIN 3.6 3.6 3.8   < > 3.9 4.0 3.7  AST '20 26 20   ' < > '21 20 17  ' ALT '17 19 18   ' < > '17 16 14  ' ALKPHOS 59 64 59   < > 50 53 57  BILITOT 0.3 0.6 0.6   < > 0.5 0.9 0.6   < > = values in this interval not displayed.    RADIOGRAPHIC STUDIES: I have personally reviewed the radiological images as listed and agreed with the findings in the report. CT CHEST ABDOMEN PELVIS W CONTRAST  Result Date: 09/08/2020 CLINICAL DATA:  Small-cell lung cancer, synchronous colorectal cancer, assess treatment response EXAM: CT CHEST, ABDOMEN, AND PELVIS WITH CONTRAST TECHNIQUE: Multidetector CT imaging of the chest, abdomen and pelvis was performed following the standard protocol during bolus administration of intravenous contrast. CONTRAST:  137m OMNIPAQUE IOHEXOL 300 MG/ML  SOLN COMPARISON:  Multiple priors including most recent CT chest abdomen and pelvis June 07, 2020. FINDINGS: CT CHEST FINDINGS Cardiovascular: Right chest Port-A-Cath with tip at the superior cavoatrial junction. Aortic atherosclerosis. No thoracic aortic aneurysm. No central pulmonary embolus. Normal size heart. Four-vessel coronary artery calcifications. No significant pericardial effusion/thickening. Mediastinum/Nodes: Similar appearance of the soft tissue about the AP window  on image 23/2, the site of previously FDG avid lymph node. No discretely enlarged mediastinal, hilar or axillary lymph nodes. Thyroid gland is within normal limits without discrete nodularity., the trachea and esophagus are unremarkable. Lungs/Pleura: Similar post treatment appearance of a suprahilar  mass in the left upper lobe with associated architectural distortion and fibrosis on image 57/4. The background innumerable tiny ground-glass centrilobular pulmonary nodules is unchanged compared to prior examinations. No pleural effusion. No pneumothorax. Musculoskeletal: No chest wall mass or suspicious bone lesions identified. CT ABDOMEN PELVIS FINDINGS Hepatobiliary: Interval decrease in size, conspicuity and associated peripheral enhancement associated with the biopsy-proven subcapsular segment VII lesion in the lateral hepatic dome which now measures 9 x 7 mm on image 48/2 previously 11 x 11 mm. No new suspicious hepatic lesions. Hepatic steatosis. Gallbladder is unremarkable. No biliary ductal dilation. Pancreas: Unremarkable. No pancreatic ductal dilatation or surrounding inflammatory changes. Spleen: Normal in size without focal abnormality. Adrenals/Urinary Tract: Adrenal glands are unremarkable. Kidneys are normal, without renal calculi, solid enhancing lesion, or hydronephrosis. Bladder is unremarkable degree of distension. Stomach/Bowel: Similar changes of partial right colectomy and reanastomosis. Radiopaque enteric contrast visualized to the distal small bowel. Stomach is grossly unremarkable. No suspicious small bowel wall thickening or dilation. Descending and sigmoid colonic diverticulosis without findings of acute diverticulitis. Vascular/Lymphatic: Aortic atherosclerosis. Left upper quadrant gastrosplenic varices again visualized. No pathologically enlarged abdominal or pelvic lymph nodes. Reproductive: Prostate is unremarkable. Other: No abdominopelvic ascites. No overt peritoneal or omental nodularity. Musculoskeletal: Multilevel degenerative change of the spine. No suspicious lytic or blastic lesion of bone. IMPRESSION: 1. Interval decrease in size, conspicuity and associated peripheral enhancement associated with the biopsy-proven subcapsular segment VII lesion, consistent with treatment  response. No new suspicious hepatic lesions. 2. Similar appearance of the ill-defined soft tissue about the AP window, at the site of previously FDG avid lymph node. No overtly enlarged mediastinal, hilar, or axillary lymph nodes. 3. Unchanged post treatment appearance of a suprahilar mass in the left upper lobe with associated architectural distortion and fibrosis. 4. No evidence of new metastatic disease in the chest abdomen or pelvis. 5. Hepatic steatosis. 6. Descending and sigmoid colonic diverticulosis without findings of acute diverticulitis. 7. Aortic atherosclerosis.  Coronary artery calcifications. Aortic Atherosclerosis (ICD10-I70.0). Electronically Signed   By: Dahlia Bailiff MD   On: 09/08/2020 16:20    ASSESSMENT & PLAN:   Cancer of ascending colon ([place]) #Right colon cancer stage IV- liver metastases; adenocarcinoma; on 5FU + avastin; CT MARCH 16th, 2022-continued partial response of the liver lesion; no new liver lesions noted.  Stable mediastinal adenopathy; left suprahilar mass stable.  # HOLD chemo today 5FU with Avastin;  Labs today reviewed- sec to blood diarrrhea [see below]  # Bloody diarrhea [up to 2 a day]/abdominal cramping- question secondary to Avastin-currently resolved; CT scan- A/P-NEG. Colonoscopy-oct 2020 [Dr.Anna]; hold chemotherapy especially given the improved CT scan.  If recurs recommend colonoscopy.  Patient call us.  # Anemia- secondary- sec to chemo-hemoglobin on 11-12-STABLE.   #Hyponatremia sodium 130-question dehydration/diarrhea-continue p.o. fluid intake.  # Back pain- MRI lumbar spine- July 2021-NEG for mets; severe arthritic changes; spinal canal stenosis-stable hydrocodone 1 day prn.  # Poorly controlled diabetes blood sugars-better controlled as per patients.; over all stable;  FBG-116 STABLE.   # LEFT hilar/LUL-  Limited stage small cell cancer-s/p chemo-RT; CT scan chest February 23, 2020-LUL radiation changes- STABLE;DEC 12th, 2021CT STABLE. .     # HTN- 140s/ 70-on Avastin-stable continue current anti-HTNs.   #  DISPOSITION:  # follow up in 4 weeks-MD; labs-cbc/cmp/cea; UA; 5FU + avastin; D-3 pump de-access; -Dr.B   # I reviewed the blood work- with the patient in detail; also reviewed the imaging independently [as summarized above]; and with the patient in detail.      All questions were answered. The patient knows to call the clinic with any problems, questions or concerns.    Cammie Sickle, MD 09/14/2020 9:39 AM

## 2020-09-14 NOTE — Progress Notes (Signed)
Still has some weakness. Diarrhea has gotten better.

## 2020-09-16 ENCOUNTER — Inpatient Hospital Stay: Payer: BC Managed Care – PPO

## 2020-10-12 ENCOUNTER — Inpatient Hospital Stay (HOSPITAL_BASED_OUTPATIENT_CLINIC_OR_DEPARTMENT_OTHER): Payer: BC Managed Care – PPO | Admitting: Internal Medicine

## 2020-10-12 ENCOUNTER — Inpatient Hospital Stay: Payer: BC Managed Care – PPO

## 2020-10-12 ENCOUNTER — Inpatient Hospital Stay: Payer: BC Managed Care – PPO | Attending: Internal Medicine

## 2020-10-12 ENCOUNTER — Encounter: Payer: Self-pay | Admitting: Internal Medicine

## 2020-10-12 VITALS — BP 135/72 | HR 67 | Temp 96.5°F | Resp 16 | Ht 68.0 in | Wt 147.7 lb

## 2020-10-12 DIAGNOSIS — F17210 Nicotine dependence, cigarettes, uncomplicated: Secondary | ICD-10-CM | POA: Insufficient documentation

## 2020-10-12 DIAGNOSIS — I10 Essential (primary) hypertension: Secondary | ICD-10-CM | POA: Insufficient documentation

## 2020-10-12 DIAGNOSIS — E1151 Type 2 diabetes mellitus with diabetic peripheral angiopathy without gangrene: Secondary | ICD-10-CM | POA: Diagnosis not present

## 2020-10-12 DIAGNOSIS — Z794 Long term (current) use of insulin: Secondary | ICD-10-CM | POA: Diagnosis not present

## 2020-10-12 DIAGNOSIS — Z9221 Personal history of antineoplastic chemotherapy: Secondary | ICD-10-CM | POA: Insufficient documentation

## 2020-10-12 DIAGNOSIS — C182 Malignant neoplasm of ascending colon: Secondary | ICD-10-CM | POA: Insufficient documentation

## 2020-10-12 DIAGNOSIS — E1165 Type 2 diabetes mellitus with hyperglycemia: Secondary | ICD-10-CM | POA: Diagnosis not present

## 2020-10-12 DIAGNOSIS — F32A Depression, unspecified: Secondary | ICD-10-CM | POA: Diagnosis not present

## 2020-10-12 DIAGNOSIS — Z79899 Other long term (current) drug therapy: Secondary | ICD-10-CM | POA: Diagnosis not present

## 2020-10-12 DIAGNOSIS — M4800 Spinal stenosis, site unspecified: Secondary | ICD-10-CM | POA: Diagnosis not present

## 2020-10-12 DIAGNOSIS — R590 Localized enlarged lymph nodes: Secondary | ICD-10-CM | POA: Diagnosis not present

## 2020-10-12 DIAGNOSIS — C787 Secondary malignant neoplasm of liver and intrahepatic bile duct: Secondary | ICD-10-CM | POA: Diagnosis not present

## 2020-10-12 DIAGNOSIS — Z923 Personal history of irradiation: Secondary | ICD-10-CM | POA: Insufficient documentation

## 2020-10-12 DIAGNOSIS — Z95828 Presence of other vascular implants and grafts: Secondary | ICD-10-CM

## 2020-10-12 DIAGNOSIS — C801 Malignant (primary) neoplasm, unspecified: Secondary | ICD-10-CM

## 2020-10-12 DIAGNOSIS — C771 Secondary and unspecified malignant neoplasm of intrathoracic lymph nodes: Secondary | ICD-10-CM | POA: Insufficient documentation

## 2020-10-12 DIAGNOSIS — C781 Secondary malignant neoplasm of mediastinum: Secondary | ICD-10-CM

## 2020-10-12 DIAGNOSIS — E871 Hypo-osmolality and hyponatremia: Secondary | ICD-10-CM | POA: Diagnosis not present

## 2020-10-12 DIAGNOSIS — C3412 Malignant neoplasm of upper lobe, left bronchus or lung: Secondary | ICD-10-CM | POA: Diagnosis not present

## 2020-10-12 DIAGNOSIS — I739 Peripheral vascular disease, unspecified: Secondary | ICD-10-CM

## 2020-10-12 LAB — URINALYSIS, COMPLETE (UACMP) WITH MICROSCOPIC
Bilirubin Urine: NEGATIVE
Glucose, UA: NEGATIVE mg/dL
Hgb urine dipstick: NEGATIVE
Ketones, ur: NEGATIVE mg/dL
Nitrite: NEGATIVE
Protein, ur: 30 mg/dL — AB
Specific Gravity, Urine: 1.014 (ref 1.005–1.030)
Squamous Epithelial / HPF: NONE SEEN (ref 0–5)
pH: 6 (ref 5.0–8.0)

## 2020-10-12 LAB — COMPREHENSIVE METABOLIC PANEL
ALT: 12 U/L (ref 0–44)
AST: 21 U/L (ref 15–41)
Albumin: 3.8 g/dL (ref 3.5–5.0)
Alkaline Phosphatase: 55 U/L (ref 38–126)
Anion gap: 10 (ref 5–15)
BUN: 18 mg/dL (ref 8–23)
CO2: 23 mmol/L (ref 22–32)
Calcium: 9.1 mg/dL (ref 8.9–10.3)
Chloride: 99 mmol/L (ref 98–111)
Creatinine, Ser: 1.15 mg/dL (ref 0.61–1.24)
GFR, Estimated: >60 mL/min (ref >60)
Glucose, Bld: 210 mg/dL — ABNORMAL HIGH (ref 70–99)
Potassium: 4.8 mmol/L (ref 3.5–5.1)
Sodium: 132 mmol/L — ABNORMAL LOW (ref 135–145)
Total Bilirubin: 0.4 mg/dL (ref 0.3–1.2)
Total Protein: 7 g/dL (ref 6.5–8.1)

## 2020-10-12 LAB — CBC WITH DIFFERENTIAL/PLATELET
Abs Immature Granulocytes: 0.02 10*3/uL (ref 0.00–0.07)
Basophils Absolute: 0.1 10*3/uL (ref 0.0–0.1)
Basophils Relative: 1 %
Eosinophils Absolute: 0.6 10*3/uL — ABNORMAL HIGH (ref 0.0–0.5)
Eosinophils Relative: 9 %
HCT: 35.9 % — ABNORMAL LOW (ref 39.0–52.0)
Hemoglobin: 11.9 g/dL — ABNORMAL LOW (ref 13.0–17.0)
Immature Granulocytes: 0 %
Lymphocytes Relative: 28 %
Lymphs Abs: 1.9 10*3/uL (ref 0.7–4.0)
MCH: 28.6 pg (ref 26.0–34.0)
MCHC: 33.1 g/dL (ref 30.0–36.0)
MCV: 86.3 fL (ref 80.0–100.0)
Monocytes Absolute: 0.6 10*3/uL (ref 0.1–1.0)
Monocytes Relative: 8 %
Neutro Abs: 3.5 10*3/uL (ref 1.7–7.7)
Neutrophils Relative %: 54 %
Platelets: 154 10*3/uL (ref 150–400)
RBC: 4.16 MIL/uL — ABNORMAL LOW (ref 4.22–5.81)
RDW: 15 % (ref 11.5–15.5)
WBC: 6.6 10*3/uL (ref 4.0–10.5)
nRBC: 0 % (ref 0.0–0.2)

## 2020-10-12 MED ORDER — HYDROCODONE-ACETAMINOPHEN 5-325 MG PO TABS: ORAL_TABLET | ORAL | 0 refills | Status: DC

## 2020-10-12 MED ORDER — HEPARIN SOD (PORK) LOCK FLUSH 100 UNIT/ML IV SOLN
500.0000 [IU] | Freq: Once | INTRAVENOUS | Status: AC
  Administered 2020-10-12: 500 [IU] via INTRAVENOUS
  Filled 2020-10-12: qty 5

## 2020-10-12 MED ORDER — SODIUM CHLORIDE 0.9% FLUSH
10.0000 mL | Freq: Once | INTRAVENOUS | Status: AC
  Administered 2020-10-12: 10 mL via INTRAVENOUS
  Filled 2020-10-12: qty 10

## 2020-10-12 NOTE — Progress Notes (Signed)
[place] NOTE  Patient Care Team: Laneta Simmers, NP as PCP - General (Nurse Practitioner) Telford Nab, RN as Registered Nurse Clent Jacks, RN as Oncology Nurse Navigator  CHIEF COMPLAINTS/PURPOSE OF CONSULTATION: Lung cancer/colon cancer   Oncology History Overview Note  # July 2020- SMALL CELL CA METASTATIC TO MEDIASTINAL LN [open Bx; Dr.Oaks]; TxN2M0; July 2nd 2020-PET- 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  MRI brain negative  # aug 17th 2020-carbo etoposide-RT [? 8/19]; #4 cycle carbo-Etop [finished dec 30th,2020]  #August 2020 iron deficient anemia-question etiology; IV Feraheme [no colonoscopy]  # OCT 2020- colo/ Cecal adeno ca; MRI liver- 1 cm enhancing lesion "metastasis" [Dr.Anna/Dr.Cannon]-difficulty biopsy.  November 02-2019 right hemicolectomy- STAGE III [pT3pN1 (2/13LN)]- HOLD adjuvant therapy. MMR- INTACT/LOW  #May 2021-liver biopsy-possible adenocarcinoma; colorectal origin.  Stage IV colon cancer  #May 24-2021-FOLFOX with Avastin; AUG 31st,2021- STABLE liver lesion; SEP 1st, 2021- Discontinue LV+Ox sec to severe fatigue/PN; cont 5FU CIV + Avastin  # COPD/  DM-2- on OHA/ smoker/ PVD/peripheral neuropathy  # History of alcohol abuse/quit 2014/ Hx of abdominal trauma [at 21y]  #July 2021-foundation 1 NGS [liver metastases]- RAS WILD TYPE **  # Jan 1st, 2022-Covid infection [prior vaccination; monoclonal infusion-not hospitalized]  # DIAGNOSIS:   # SMALL CELL CA-limited stage-status post chemoradiation;   # colon cancer-stage IV-solitary liver lesion  GOALS: Control  CURRENT/MOST RECENT THERAPY : FOLFOX with Avastin   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 ([place])  01/29/2019 Initial Diagnosis   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 [facility])   02/09/2019 - 06/24/2019 Chemotherapy   The patient had palonosetron (ALOXI) injection 0.25  mg, 0.25 mg, Intravenous,  Once, 4 of 4 cycles Administration: 0.25 mg (02/09/2019), 0.25 mg (03/03/2019), 0.25 mg (06/02/2019), 0.25 mg (06/22/2019) CARBOplatin (PARAPLATIN) 410 mg in sodium chloride 0.9 % 250 mL chemo infusion, 410 mg (100 % of original dose 414 mg), Intravenous,  Once, 4 of 4 cycles Dose modification:   (original dose 414 mg, Cycle 1) Administration: 410 mg (02/09/2019), 410 mg (03/03/2019), 410 mg (06/02/2019), 410 mg (06/22/2019) etoposide (VEPESID) 180 mg in sodium chloride 0.9 % 500 mL chemo infusion, 100 mg/m2 = 180 mg, Intravenous,  Once, 4 of 4 cycles Administration: 180 mg (02/09/2019), 180 mg (02/10/2019), 180 mg (02/11/2019), 180 mg (03/03/2019), 180 mg (03/04/2019), 180 mg (03/05/2019), 180 mg (06/02/2019), 180 mg (06/03/2019), 180 mg (06/04/2019), 180 mg (06/22/2019), 180 mg (06/23/2019), 180 mg (06/24/2019) fosaprepitant (EMEND) 150 mg, dexamethasone (DECADRON) 6 mg in sodium chloride 0.9 % 145 mL IVPB, , Intravenous,  Once, 2 of 2 cycles Administration:  (06/02/2019),  (06/22/2019)  for chemotherapy treatment.    Cancer of ascending colon ([place])  05/04/2019 Initial Diagnosis   Cancer of ascending colon ([place])   11/30/2019 -  Chemotherapy    Patient is on Treatment Plan: COLORECTAL FOLFOX + BEVACIZUMAB Q14D       HISTORY OF PRESENTING ILLNESS:   Kevin Mckay 70 y.o.  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  Patient complains of bilateral posterior knee pain/lower extremity pain especially with exertion.  Also notes to have cramps at nighttime.  She has been waking up at nighttime.  Has been needing to take hydrocodone as needed at nighttime.  Continues to complain of generalized weakness.  Intermittent diarrhea.  No blood in stools  no black or stools.   Review of Systems  Constitutional: Positive for malaise/fatigue and  weight loss. Negative for chills, diaphoresis and fever.  HENT: Negative for nosebleeds and sore throat.   Eyes: Negative for double vision.  Respiratory: Positive for shortness of breath. Negative for hemoptysis and wheezing.   Cardiovascular: Negative for chest pain, palpitations and orthopnea.  Gastrointestinal: Negative for abdominal pain, blood in stool, constipation, diarrhea, heartburn, melena, nausea and vomiting.  Genitourinary: Negative for dysuria, frequency and urgency.  Musculoskeletal: Positive for back pain and joint pain.  Skin: Negative.  Negative for itching and rash.  Neurological: Positive for tingling. Negative for dizziness, focal weakness and weakness.  Endo/Heme/Allergies: Does not bruise/bleed easily.  Psychiatric/Behavioral: Negative for depression. The patient is not nervous/anxious and does not have insomnia.      MEDICAL HISTORY:  Past Medical History:  Diagnosis Date  . Asthma   . Cancer ([place])    Metastatic small cell lung cancer  . Cerebral aneurysm   . Chronic painful diabetic neuropathy ([place])   . Depression   . Diabetes mellitus without complication ([place])   . Essential hypertension   . History of kidney stones   . Occasional tremors   . Tobacco use     SURGICAL HISTORY: Past Surgical History:  Procedure Laterality Date  . ABDOMINAL SURGERY     age 43. trauma surgery due to forklift injury- liver and spleen  . COLONOSCOPY WITH PROPOFOL N/A 03/27/2019   Procedure: COLONOSCOPY WITH PROPOFOL;  Surgeon: Jonathon Bellows, MD;  Location: [facility] ENDOSCOPY;  Service: Gastroenterology;  Laterality: N/A;  . COLOSTOMY REVISION Right 05/04/2019   Procedure: COLON RESECTION RIGHT-right hemicolectomy-open;  Surgeon: Fredirick Maudlin, MD;  Location: [facility] ORS;  Service: General;  Laterality: Right;  . ESOPHAGOGASTRODUODENOSCOPY (EGD) WITH PROPOFOL N/A 03/27/2019   Procedure: ESOPHAGOGASTRODUODENOSCOPY (EGD) WITH PROPOFOL;  Surgeon: Jonathon Bellows, MD;  Location: [facility]  ENDOSCOPY;  Service: Gastroenterology;  Laterality: N/A;  . IR GENERIC HISTORICAL  05/31/2016   IR ANGIO VERTEBRAL SEL VERTEBRAL BILAT MOD SED 05/31/2016 Consuella Lose, MD [facility]-INTERV RAD  . IR GENERIC HISTORICAL  05/31/2016   IR ANGIO INTRA EXTRACRAN SEL INTERNAL CAROTID BILAT MOD SED 05/31/2016 Consuella Lose, MD [facility]-INTER[facility] RAD  . PORTACATH PLACEMENT Right 02/04/2019   Procedure: INSERTION PORT-A-CATH;  Surgeon: [name], MD;  Location: [facility] ORS;  Service: General;  Laterality: Right;  . THORACOTOMY Left 01/22/2019   Procedure: PRE OP BRONCH LEFT ANTERIOR THORACOTOMY WITH BIOPSY OF MEDIASTINAL MASS;  Surgeon: [name], MD;  Location: [facility] ORS;  Service: General;  Laterality: Left;    SOCIAL HISTORY: Social History   Socioeconomic History  . Marital status: Married    Spouse name: Not on file  . Number of children: Not on file  . Years of education: Not on file  . Highest education level: Not on file  Occupational History  . Not on file  Tobacco Use  . Smoking status: Current Every Day Smoker    Packs/day: 0.50    Years: 50.00    Pack years: 25.00    Types: Cigarettes  . Smokeless tobacco: Never Used  Vaping Use  . Vaping Use: Every day  . Substances: Nicotine  Substance and Sexual Activity  . Alcohol use: No  . Drug use: Yes    Types: Barbituates, Marijuana  . Sexual activity: Not on file  Other Topics Concern  . Not on file  Social History Narrative   Lives in [place]; wife/ son/grandson [custody]; smoker; vending business; hx of  alcoholism- quit at 44.    Social Determinants of Health   Financial Resource Strain: Not on file  Food Insecurity: Not on file  Transportation Needs: Not on file  Physical Activity: Not on file  Stress: Not on file  Social Connections: Not on file  Intimate Partner Violence: Not on file    FAMILY HISTORY: Family History  Problem Relation Age of Onset  . Lymphoma Father   . Liver cancer Paternal Uncle   . Lung cancer  Paternal Uncle   . Lung cancer Maternal Uncle     ALLERGIES:  is allergic to ferumoxytol, amoxicillin, and codeine.  MEDICATIONS:  Current Outpatient Medications  Medication Sig Dispense Refill  . amLODipine (NORVASC) 10 MG tablet Take 10 mg by mouth every morning.     . budesonide-formoterol (SYMBICORT) 160-4.5 MCG/ACT inhaler Inhale 2 puffs into the lungs 2 (two) times daily.    . DULoxetine (CYMBALTA) 30 MG capsule Take 30 mg by mouth every morning.     . gabapentin (NEURONTIN) 800 MG tablet Take 800 mg by mouth 3 (three) times daily.    . insulin glargine (LANTUS) 100 UNIT/ML injection Inject into the skin daily. Units based By sliding scale    . Ipratropium-Albuterol (COMBIVENT) 20-100 MCG/ACT AERS respimat Inhale into the lungs.    . lidocaine-prilocaine (EMLA) cream Apply 1 application topically as needed. 30-45 mins prior to port access. 30 g 0  . linaclotide (LINZESS) 145 MCG CAPS capsule Take 145 mcg by mouth daily before breakfast.    . lisinopril-hydrochlorothiazide (ZESTORETIC) 10-12.5 MG tablet Take 1 tablet by mouth daily.    Marland Kitchen lovastatin (MEVACOR) 20 MG tablet Take 20 mg by mouth every evening.    . metFORMIN (GLUCOPHAGE) 1000 MG tablet Take 1,000 mg by mouth 2 (two) times daily with a meal.     . omeprazole (PRILOSEC) 40 MG capsule Take 1 capsule (40 mg total) by mouth daily. 90 capsule 1  . ondansetron (ZOFRAN) 8 MG tablet Take 1 tablet (8 mg total) by mouth every 8 (eight) hours as needed for nausea or vomiting (start 3 days; after chemo). 40 tablet 1  . prazosin (MINIPRESS) 1 MG capsule Take 1 mg by mouth at bedtime.    . pyridOXINE (VITAMIN B-6) 100 MG tablet Take 100 mg by mouth daily.    . sitaGLIPtin (JANUVIA) 25 MG tablet Take 25 mg by mouth daily.    . vitamin B-12 (CYANOCOBALAMIN) 100 MCG tablet Take 100 mcg by mouth daily.    Marland Kitchen HYDROcodone-acetaminophen (NORCO/VICODIN) 5-325 MG tablet Tale one tablet a day as needed for pain. 45 tablet 0  . polyethylene glycol  powder (MIRALAX) 17 GM/SCOOP powder Mix full container in 64 ounces of Gatorade or other clear liquid. NO RED Liquids (Patient not taking: Reported on 10/12/2020) 238 g 0   No current facility-administered medications for this visit.   Facility-Administered Medications Ordered in Other Visits  Medication Dose Route Frequency Provider Last Rate Last Admin  . heparin lock flush 100 unit/mL  500 Units Intravenous Once Charlaine Dalton R, MD      . sodium chloride flush (NS) 0.9 % injection 10 mL  10 mL Intravenous PRN Cammie Sickle, MD   10 mL at 09/13/20 0943      .  PHYSICAL EXAMINATION: ECOG PERFORMANCE STATUS: 0 - Asymptomatic  Vitals:   10/12/20 0836  BP: 135/72  Pulse: 67  Resp: 16  Temp: (!) 96.5 F (35.8 C)  SpO2: 100%   Filed Weights  10/12/20 0836  Weight: 147 lb 11.3 oz (67 kg)    Physical Exam HENT:     Head: Normocephalic and atraumatic.     Mouth/Throat:     Pharynx: No oropharyngeal exudate.  Eyes:     Pupils: Pupils are equal, round, and reactive to light.  Cardiovascular:     Rate and Rhythm: Normal rate and regular rhythm.  Pulmonary:     Effort: No respiratory distress.     Breath sounds: No wheezing.  Abdominal:     General: Bowel sounds are normal. There is no distension.     Palpations: Abdomen is soft. There is no mass.     Tenderness: There is no abdominal tenderness. There is no guarding or rebound.     Comments: Abdominal incision well-healed.  Musculoskeletal:        General: No tenderness. Normal range of motion.     Cervical back: Normal range of motion and neck supple.  Skin:    General: Skin is warm.  Neurological:     Mental Status: He is alert and oriented to person, place, and time.  Psychiatric:        Mood and Affect: Affect normal.     LABORATORY DATA:  I have reviewed the data as listed Lab Results  Component Value Date   WBC 6.6 10/12/2020   HGB 11.9 (L) 10/12/2020   HCT 35.9 (L) 10/12/2020   MCV 86.3  10/12/2020   PLT 154 10/12/2020   Recent Labs    02/24/20 0819 03/09/20 0813 03/23/20 0850 04/06/20 0813 08/17/20 0837 08/31/20 0821 09/13/20 0930  NA 134* 132* 136   < > 133* 134* 130*  K 4.1 3.9 3.6   < > 4.5 4.4 4.5  CL 98 97* 100   < > 98 100 97*  CO2 '26 25 25   ' < > '26 24 24  ' GLUCOSE 196* 260* 170*   < > 138* 263* 116*  BUN 33* 16 17   < > '17 17 14  ' CREATININE 0.96 0.96 0.97   < > 1.21 1.27* 1.17  CALCIUM 8.6* 8.6* 9.1   < > 8.9 9.0 8.8*  GFRNONAA >60 >60 >60   < > >60 >60 >60  GFRAA >60 >60 >60  --   --   --   --   PROT 6.8 6.8 6.9   < > 6.9 7.3 6.8  ALBUMIN 3.6 3.6 3.8   < > 3.9 4.0 3.7  AST '20 26 20   ' < > '21 20 17  ' ALT '17 19 18   ' < > '17 16 14  ' ALKPHOS 59 64 59   < > 50 53 57  BILITOT 0.3 0.6 0.6   < > 0.5 0.9 0.6   < > = values in this interval not displayed.    RADIOGRAPHIC STUDIES: I have personally reviewed the radiological images as listed and agreed with the findings in the report. No results found.  ASSESSMENT & PLAN:   Cancer of ascending colon ([place]) #Right colon cancer stage IV- liver metastases; adenocarcinoma; on 5FU + avastin; CT MARCH 16th, 2022-continued partial response of the liver lesion; no new liver lesions noted.  Stable mediastinal adenopathy; left suprahilar mass stable.  # HOLD chemo today 5FU with Avastin [given over all poor tolerance/Bil LE pain]; esp- given STABLE CT scan in March 2022.   # Bil LE pain esp on exertion- ? PVD refer to Dr.Dew.    # Anemia- secondary- sec to chemo-hemoglobin on  11-12-STABLE.   #Hyponatremia sodium 130-question dehydration/diarrhea-continue p.o. fluid intake.  # Back pain- MRI lumbar spine- July 2021-NEG for mets; severe arthritic changes; spinal canal stenosis-stable hydrocodone 1 day prn.  # Poorly controlled diabetes blood sugars-better controlled as per patients.; over all stable;  FBG-116 STABLE.   # LEFT hilar/LUL-  Limited stage small cell cancer-s/p chemo-RT;March 2022-CT chest-STABLE.   #  HTN- 140s/ 70-on Avastin-stable continue current anti-HTNs.   # DISPOSITION:  # referral to Dr.Dew re: possible PVD.  # NO chemo today; cancel- d-2 appt.  # follow up in 5 weeks-MD; labs-cbc/cmp/cea; UA; NO chemo; -Dr.B      All questions were answered. The patient knows to call the clinic with any problems, questions or concerns.    Cammie Sickle, MD 10/12/2020 9:17 AM

## 2020-10-12 NOTE — Assessment & Plan Note (Addendum)
#  Right colon cancer stage IV- liver metastases; adenocarcinoma; on 5FU + avastin; CT MARCH 16th, 2022-continued partial response of the liver lesion; no new liver lesions noted.  Stable mediastinal adenopathy; left suprahilar mass stable.  # HOLD chemo today 5FU with Avastin [given over all poor tolerance/Bil LE pain]; esp- given STABLE CT scan in March 2022.  Plan chemo holiday.  We will plan to restart again in June 2022; with a scan prior.  # Bil LE pain esp on exertion-clinically suspect PVD refer to Dr.Dew.    # Anemia- secondary- sec to chemo-hemoglobin on 11-12-STABLE.   #Hyponatremia sodium 130-question dehydration/diarrhea-continue p.o. fluid intake.  # Back pain- MRI lumbar spine- July 2021-NEG for mets; severe arthritic changes; spinal canal stenosis-stable hydrocodone 1 day prn.  Stable.  New prescription given.  # Poorly controlled diabetes blood sugars-better controlled as per patients.; over all stable;  FBG-116 STABLE.   # LEFT hilar/LUL-  Limited stage small cell cancer-s/p chemo-RT;March 2022-CT chest-STABLE.   # HTN- 140s/ 70-on Avastin-stable continue current anti-HTNs.   # DISPOSITION:  # referral to Dr.Dew re: possible PVD.  # NO chemo today; cancel- d-2 appt.  # follow up in 5 weeks-MD; labs-cbc/cmp/cea; UA; NO chemo; -Dr.B

## 2020-10-12 NOTE — Progress Notes (Signed)
States he has been feeling weak lately. Still not forming solid stools. Woke up this morning with both legs numb and feeling weak.

## 2020-10-13 LAB — CEA: CEA: 4 ng/mL (ref 0.0–4.7)

## 2020-10-14 ENCOUNTER — Inpatient Hospital Stay: Payer: BC Managed Care – PPO

## 2020-11-09 ENCOUNTER — Other Ambulatory Visit: Payer: Self-pay | Admitting: Physician Assistant

## 2020-11-09 DIAGNOSIS — M79604 Pain in right leg: Secondary | ICD-10-CM

## 2020-11-16 ENCOUNTER — Encounter: Payer: Self-pay | Admitting: Internal Medicine

## 2020-11-16 ENCOUNTER — Other Ambulatory Visit: Payer: Self-pay

## 2020-11-16 ENCOUNTER — Inpatient Hospital Stay: Payer: BC Managed Care – PPO | Admitting: Internal Medicine

## 2020-11-16 ENCOUNTER — Inpatient Hospital Stay: Payer: BC Managed Care – PPO | Attending: Internal Medicine

## 2020-11-16 VITALS — BP 147/66 | HR 67 | Temp 97.6°F | Resp 16 | Wt 149.9 lb

## 2020-11-16 DIAGNOSIS — I10 Essential (primary) hypertension: Secondary | ICD-10-CM | POA: Diagnosis not present

## 2020-11-16 DIAGNOSIS — Z794 Long term (current) use of insulin: Secondary | ICD-10-CM | POA: Diagnosis not present

## 2020-11-16 DIAGNOSIS — E119 Type 2 diabetes mellitus without complications: Secondary | ICD-10-CM | POA: Diagnosis not present

## 2020-11-16 DIAGNOSIS — D509 Iron deficiency anemia, unspecified: Secondary | ICD-10-CM | POA: Diagnosis not present

## 2020-11-16 DIAGNOSIS — C781 Secondary malignant neoplasm of mediastinum: Secondary | ICD-10-CM

## 2020-11-16 DIAGNOSIS — Z79899 Other long term (current) drug therapy: Secondary | ICD-10-CM | POA: Insufficient documentation

## 2020-11-16 DIAGNOSIS — Z9221 Personal history of antineoplastic chemotherapy: Secondary | ICD-10-CM | POA: Diagnosis not present

## 2020-11-16 DIAGNOSIS — C787 Secondary malignant neoplasm of liver and intrahepatic bile duct: Secondary | ICD-10-CM | POA: Insufficient documentation

## 2020-11-16 DIAGNOSIS — C801 Malignant (primary) neoplasm, unspecified: Secondary | ICD-10-CM | POA: Diagnosis not present

## 2020-11-16 DIAGNOSIS — F17210 Nicotine dependence, cigarettes, uncomplicated: Secondary | ICD-10-CM | POA: Insufficient documentation

## 2020-11-16 DIAGNOSIS — Z923 Personal history of irradiation: Secondary | ICD-10-CM | POA: Insufficient documentation

## 2020-11-16 DIAGNOSIS — C182 Malignant neoplasm of ascending colon: Secondary | ICD-10-CM | POA: Insufficient documentation

## 2020-11-16 DIAGNOSIS — C3412 Malignant neoplasm of upper lobe, left bronchus or lung: Secondary | ICD-10-CM | POA: Insufficient documentation

## 2020-11-16 LAB — COMPREHENSIVE METABOLIC PANEL
ALT: 15 U/L (ref 0–44)
AST: 20 U/L (ref 15–41)
Albumin: 3.7 g/dL (ref 3.5–5.0)
Alkaline Phosphatase: 56 U/L (ref 38–126)
Anion gap: 10 (ref 5–15)
BUN: 16 mg/dL (ref 8–23)
CO2: 23 mmol/L (ref 22–32)
Calcium: 8.8 mg/dL — ABNORMAL LOW (ref 8.9–10.3)
Chloride: 96 mmol/L — ABNORMAL LOW (ref 98–111)
Creatinine, Ser: 1.25 mg/dL — ABNORMAL HIGH (ref 0.61–1.24)
GFR, Estimated: >60 mL/min (ref >60)
Glucose, Bld: 221 mg/dL — ABNORMAL HIGH (ref 70–99)
Potassium: 4.5 mmol/L (ref 3.5–5.1)
Sodium: 129 mmol/L — ABNORMAL LOW (ref 135–145)
Total Bilirubin: 0.6 mg/dL (ref 0.3–1.2)
Total Protein: 6.8 g/dL (ref 6.5–8.1)

## 2020-11-16 LAB — URINALYSIS, COMPLETE (UACMP) WITH MICROSCOPIC
Bilirubin Urine: NEGATIVE
Glucose, UA: 150 mg/dL — AB
Hgb urine dipstick: NEGATIVE
Ketones, ur: NEGATIVE mg/dL
Nitrite: NEGATIVE
Protein, ur: NEGATIVE mg/dL
Specific Gravity, Urine: 1.012 (ref 1.005–1.030)
pH: 5 (ref 5.0–8.0)

## 2020-11-16 LAB — CBC WITH DIFFERENTIAL/PLATELET
Abs Immature Granulocytes: 0.02 10*3/uL (ref 0.00–0.07)
Basophils Absolute: 0.1 10*3/uL (ref 0.0–0.1)
Basophils Relative: 1 %
Eosinophils Absolute: 0.6 10*3/uL — ABNORMAL HIGH (ref 0.0–0.5)
Eosinophils Relative: 8 %
HCT: 33.2 % — ABNORMAL LOW (ref 39.0–52.0)
Hemoglobin: 11.2 g/dL — ABNORMAL LOW (ref 13.0–17.0)
Immature Granulocytes: 0 %
Lymphocytes Relative: 22 %
Lymphs Abs: 1.5 10*3/uL (ref 0.7–4.0)
MCH: 28.2 pg (ref 26.0–34.0)
MCHC: 33.7 g/dL (ref 30.0–36.0)
MCV: 83.6 fL (ref 80.0–100.0)
Monocytes Absolute: 0.5 10*3/uL (ref 0.1–1.0)
Monocytes Relative: 7 %
Neutro Abs: 4.2 10*3/uL (ref 1.7–7.7)
Neutrophils Relative %: 62 %
Platelets: 148 10*3/uL — ABNORMAL LOW (ref 150–400)
RBC: 3.97 MIL/uL — ABNORMAL LOW (ref 4.22–5.81)
RDW: 14.6 % (ref 11.5–15.5)
WBC: 6.8 10*3/uL (ref 4.0–10.5)
nRBC: 0 % (ref 0.0–0.2)

## 2020-11-16 MED ORDER — SODIUM CHLORIDE 0.9% FLUSH
10.0000 mL | Freq: Once | INTRAVENOUS | Status: AC
Start: 2020-11-16 — End: 2020-11-16
  Administered 2020-11-16: 10 mL via INTRAVENOUS
  Filled 2020-11-16: qty 10

## 2020-11-16 MED ORDER — HEPARIN SOD (PORK) LOCK FLUSH 100 UNIT/ML IV SOLN
500.0000 [IU] | Freq: Once | INTRAVENOUS | Status: AC
  Administered 2020-11-16: 500 [IU] via INTRAVENOUS
  Filled 2020-11-16: qty 5

## 2020-11-16 MED ORDER — HEPARIN SOD (PORK) LOCK FLUSH 100 UNIT/ML IV SOLN
INTRAVENOUS | Status: AC
  Filled 2020-11-16: qty 5

## 2020-11-16 NOTE — Assessment & Plan Note (Addendum)
#  Right colon cancer stage IV- liver metastases; adenocarcinoma; on 5FU + avastin; CT MARCH 16th, 2022-continued partial response of the liver lesion; no new liver lesions noted.  STABLE mediastinal adenopathy; left suprahilar mass -STABLE  # CONTINUE to hold off chemo today 5FU with Avastin [given over all poor tolerance/Bil LE pain]; esp- given STABLE CT scan in March 2022. Will repeat CT scan in mid-June.    # Bil LE pain esp on exertion-clinically suspect PVD; Awaiting appt. With Dr.dew.   # Anemia- secondary- sec to chemo-hemoglobin on 11-12-STABLE.   #Hyponatremia sodium 129-over all STABLE-  ? Etiology; limit 1.5 lit free water; increase salt intake-.  # Back pain- MRI lumbar spine- July 2021-NEG for mets; severe arthritic changes; spinal canal stenosis-stable hydrocodone 1 day prn. STABLE.   # Poorly controlled diabetes blood sugars-better controlled as per patients.; over all stable;  FBG-221 STABLE.   # LEFT hilar/LUL-  Limited stage small cell cancer-s/p chemo-RT;March 2022-CT chest-STABLE   # HTN- 140s/ 70-on Avastin-stable continue current anti-HTNs.   # DISPOSITION:  # follow up in 4 weeks-MD; labs-cbc/cmp/cea; UA; 5FU-Avastin; CT prior- -Dr.B

## 2020-11-16 NOTE — Progress Notes (Signed)
Patient here for follow up. Reports right leg pain he is being seen by vascular.

## 2020-11-16 NOTE — Progress Notes (Signed)
[place] NOTE  Patient Care Team: Laneta Simmers, NP (Inactive) as PCP - General (Nurse Practitioner) Telford Nab, RN as Registered Nurse Clent Jacks, RN as Oncology Nurse Navigator  CHIEF COMPLAINTS/PURPOSE OF CONSULTATION: Lung cancer/colon cancer   Oncology History Overview Note  # July 2020- SMALL CELL CA METASTATIC TO MEDIASTINAL LN [open Bx; Dr.Oaks]; TxN2M0; July 2nd 2020-PET- 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  MRI brain negative  # aug 17th 2020-carbo etoposide-RT [? 8/19]; #4 cycle carbo-Etop [finished dec 30th,2020]  #August 2020 iron deficient anemia-question etiology; IV Feraheme [no colonoscopy]  # OCT 2020- colo/ Cecal adeno ca; MRI liver- 1 cm enhancing lesion "metastasis" [Dr.Anna/Dr.Cannon]-difficulty biopsy.  November 02-2019 right hemicolectomy- STAGE III [pT3pN1 (2/13LN)]- HOLD adjuvant therapy. MMR- INTACT/LOW  #May 2021-liver biopsy-possible adenocarcinoma; colorectal origin.  Stage IV colon cancer  #May 24-2021-FOLFOX with Avastin; AUG 31st,2021- STABLE liver lesion; SEP 1st, 2021- Discontinue LV+Ox sec to severe fatigue/PN; cont 5FU CIV + Avastin  # COPD/  DM-2- on OHA/ smoker/ PVD/peripheral neuropathy  # History of alcohol abuse/quit 2014/ Hx of abdominal trauma [at 21y]  #July 2021-foundation 1 NGS [liver metastases]- RAS WILD TYPE **  # Jan 1st, 2022-Covid infection [prior vaccination; monoclonal infusion-not hospitalized]  # DIAGNOSIS:   # SMALL CELL CA-limited stage-status post chemoradiation;   # colon cancer-stage IV-solitary liver lesion  GOALS: Control  CURRENT/MOST RECENT THERAPY : FOLFOX with Avastin   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 ([place])  01/29/2019 Initial Diagnosis   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 [facility])   02/09/2019 - 06/24/2019 Chemotherapy   The patient had palonosetron (ALOXI)  injection 0.25 mg, 0.25 mg, Intravenous,  Once, 4 of 4 cycles Administration: 0.25 mg (02/09/2019), 0.25 mg (03/03/2019), 0.25 mg (06/02/2019), 0.25 mg (06/22/2019) CARBOplatin (PARAPLATIN) 410 mg in sodium chloride 0.9 % 250 mL chemo infusion, 410 mg (100 % of original dose 414 mg), Intravenous,  Once, 4 of 4 cycles Dose modification:   (original dose 414 mg, Cycle 1) Administration: 410 mg (02/09/2019), 410 mg (03/03/2019), 410 mg (06/02/2019), 410 mg (06/22/2019) etoposide (VEPESID) 180 mg in sodium chloride 0.9 % 500 mL chemo infusion, 100 mg/m2 = 180 mg, Intravenous,  Once, 4 of 4 cycles Administration: 180 mg (02/09/2019), 180 mg (02/10/2019), 180 mg (02/11/2019), 180 mg (03/03/2019), 180 mg (03/04/2019), 180 mg (03/05/2019), 180 mg (06/02/2019), 180 mg (06/03/2019), 180 mg (06/04/2019), 180 mg (06/22/2019), 180 mg (06/23/2019), 180 mg (06/24/2019) fosaprepitant (EMEND) 150 mg, dexamethasone (DECADRON) 6 mg in sodium chloride 0.9 % 145 mL IVPB, , Intravenous,  Once, 2 of 2 cycles Administration:  (06/02/2019),  (06/22/2019)  for chemotherapy treatment.    Cancer of ascending colon ([place])  05/04/2019 Initial Diagnosis   Cancer of ascending colon ([place])   11/30/2019 -  Chemotherapy    Patient is on Treatment Plan: COLORECTAL FOLFOX + BEVACIZUMAB Q14D       HISTORY OF PRESENTING ILLNESS:   Kevin Mckay 70 y.o.  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  Patient continues to complain of bilateral knee pain/bilateral lower extremity pain especially exertion.  He is awaiting evaluation with Dr. Eduard Clos.  Denies any worsening chest pain or shortness with or cough.  No abdominal pain.  No diarrhea.  Denies any headache.  Review of Systems  Constitutional: Positive for malaise/fatigue  and weight loss. Negative for chills, diaphoresis and fever.   HENT: Negative for nosebleeds and sore throat.   Eyes: Negative for double vision.  Respiratory: Positive for shortness of breath. Negative for hemoptysis and wheezing.   Cardiovascular: Negative for chest pain, palpitations and orthopnea.  Gastrointestinal: Negative for abdominal pain, blood in stool, constipation, diarrhea, heartburn, melena, nausea and vomiting.  Genitourinary: Negative for dysuria, frequency and urgency.  Musculoskeletal: Positive for back pain and joint pain.  Skin: Negative.  Negative for itching and rash.  Neurological: Positive for tingling. Negative for dizziness, focal weakness and weakness.  Endo/Heme/Allergies: Does not bruise/bleed easily.  Psychiatric/Behavioral: Negative for depression. The patient is not nervous/anxious and does not have insomnia.      MEDICAL HISTORY:  Past Medical History:  Diagnosis Date  . Asthma   . Cancer ([place])    Metastatic small cell lung cancer  . Cerebral aneurysm   . Chronic painful diabetic neuropathy ([place])   . Depression   . Diabetes mellitus without complication ([place])   . Essential hypertension   . History of kidney stones   . Occasional tremors   . Tobacco use     SURGICAL HISTORY: Past Surgical History:  Procedure Laterality Date  . ABDOMINAL SURGERY     age 5. trauma surgery due to forklift injury- liver and spleen  . COLONOSCOPY WITH PROPOFOL N/A 03/27/2019   Procedure: COLONOSCOPY WITH PROPOFOL;  Surgeon: Jonathon Bellows, MD;  Location: [facility] ENDOSCOPY;  Service: Gastroenterology;  Laterality: N/A;  . COLOSTOMY REVISION Right 05/04/2019   Procedure: COLON RESECTION RIGHT-right hemicolectomy-open;  Surgeon: Fredirick Maudlin, MD;  Location: [facility] ORS;  Service: General;  Laterality: Right;  . ESOPHAGOGASTRODUODENOSCOPY (EGD) WITH PROPOFOL N/A 03/27/2019   Procedure: ESOPHAGOGASTRODUODENOSCOPY (EGD) WITH PROPOFOL;  Surgeon: Jonathon Bellows, MD;  Location: [facility] ENDOSCOPY;  Service: Gastroenterology;  Laterality: N/A;  . IR  GENERIC HISTORICAL  05/31/2016   IR ANGIO VERTEBRAL SEL VERTEBRAL BILAT MOD SED 05/31/2016 Consuella Lose, MD [facility]-INTERV RAD  . IR GENERIC HISTORICAL  05/31/2016   IR ANGIO INTRA EXTRACRAN SEL INTERNAL CAROTID BILAT MOD SED 05/31/2016 Consuella Lose, MD [facility]INTER[facility] RAD  . PORTACATH PLACEMENT Right 02/04/2019   Procedure: INSERTION PORT-A-CATH;  Surgeon: [name], MD;  Location: [facility] ORS;  Service: General;  Laterality: Right;  . THORACOTOMY Left 01/22/2019   Procedure: PRE OP BRONCH LEFT ANTERIOR THORACOTOMY WITH BIOPSY OF MEDIASTINAL MASS;  Surgeon: [name], MD;  Location: [facility] ORS;  Service: General;  Laterality: Left;    SOCIAL HISTORY: Social History   Socioeconomic History  . Marital status: Married    Spouse name: Not on file  . Number of children: Not on file  . Years of education: Not on file  . Highest education level: Not on file  Occupational History  . Not on file  Tobacco Use  . Smoking status: Current Every Day Smoker    Packs/day: 0.50    Years: 50.00    Pack years: 25.00    Types: Cigarettes  . Smokeless tobacco: Never Used  Vaping Use  . Vaping Use: Every day  . Substances: Nicotine  Substance and Sexual Activity  . Alcohol use: No  . Drug use: Yes    Types: Barbituates, Marijuana  . Sexual activity: Not on file  Other Topics Concern  . Not on file  Social History Narrative   Lives in [place]; wife/ son/grandson [custody]; smoker; vending business; hx of alcoholism- quit at 73.    Social Determinants of Health   Financial  Resource Strain: Not on file  Food Insecurity: Not on file  Transportation Needs: Not on file  Physical Activity: Not on file  Stress: Not on file  Social Connections: Not on file  Intimate Partner Violence: Not on file    FAMILY HISTORY: Family History  Problem Relation Age of Onset  . Lymphoma Father   . Liver cancer Paternal Uncle   . Lung cancer Paternal Uncle   . Lung cancer Maternal Uncle     ALLERGIES:   is allergic to ferumoxytol, amoxicillin, and codeine.  MEDICATIONS:  Current Outpatient Medications  Medication Sig Dispense Refill  . amLODipine (NORVASC) 10 MG tablet Take 10 mg by mouth every morning.     . budesonide-formoterol (SYMBICORT) 160-4.5 MCG/ACT inhaler Inhale 2 puffs into the lungs 2 (two) times daily.    . DULoxetine (CYMBALTA) 30 MG capsule Take 30 mg by mouth every morning.     . gabapentin (NEURONTIN) 800 MG tablet Take 800 mg by mouth 3 (three) times daily.    Marland Kitchen HYDROcodone-acetaminophen (NORCO/VICODIN) 5-325 MG tablet Tale one tablet a day as needed for pain. 45 tablet 0  . insulin glargine (LANTUS) 100 UNIT/ML injection Inject into the skin daily. Units based By sliding scale    . Ipratropium-Albuterol (COMBIVENT) 20-100 MCG/ACT AERS respimat Inhale into the lungs.    . lidocaine-prilocaine (EMLA) cream Apply 1 application topically as needed. 30-45 mins prior to port access. 30 g 0  . linaclotide (LINZESS) 145 MCG CAPS capsule Take 145 mcg by mouth daily before breakfast.    . lisinopril-hydrochlorothiazide (ZESTORETIC) 10-12.5 MG tablet Take 1 tablet by mouth daily.    Marland Kitchen lovastatin (MEVACOR) 20 MG tablet Take 20 mg by mouth every evening.    . metFORMIN (GLUCOPHAGE) 1000 MG tablet Take 1,000 mg by mouth 2 (two) times daily with a meal.     . omeprazole (PRILOSEC) 40 MG capsule Take 1 capsule (40 mg total) by mouth daily. 90 capsule 1  . ondansetron (ZOFRAN) 8 MG tablet Take 1 tablet (8 mg total) by mouth every 8 (eight) hours as needed for nausea or vomiting (start 3 days; after chemo). 40 tablet 1  . polyethylene glycol powder (MIRALAX) 17 GM/SCOOP powder Mix full container in 64 ounces of Gatorade or other clear liquid. NO RED Liquids 238 g 0  . prazosin (MINIPRESS) 1 MG capsule Take 1 mg by mouth at bedtime.    . pyridOXINE (VITAMIN B-6) 100 MG tablet Take 100 mg by mouth daily.    . sitaGLIPtin (JANUVIA) 25 MG tablet Take 25 mg by mouth daily.    . vitamin B-12  (CYANOCOBALAMIN) 100 MCG tablet Take 100 mcg by mouth daily.     No current facility-administered medications for this visit.   Facility-Administered Medications Ordered in Other Visits  Medication Dose Route Frequency Provider Last Rate Last Admin  . sodium chloride flush (NS) 0.9 % injection 10 mL  10 mL Intravenous PRN Cammie Sickle, MD   10 mL at 09/13/20 0943      .  PHYSICAL EXAMINATION: ECOG PERFORMANCE STATUS: 0 - Asymptomatic  Vitals:   11/16/20 1004  BP: (!) 147/66  Pulse: 67  Resp: 16  Temp: 97.6 F (36.4 C)  SpO2: 99%   Filed Weights   11/16/20 1004  Weight: 149 lb 14.4 oz (68 kg)    Physical Exam HENT:     Head: Normocephalic and atraumatic.     Mouth/Throat:     Pharynx: No oropharyngeal exudate.  Eyes:     Pupils: Pupils are equal, round, and reactive to light.  Cardiovascular:     Rate and Rhythm: Normal rate and regular rhythm.  Pulmonary:     Effort: No respiratory distress.     Breath sounds: No wheezing.  Abdominal:     General: Bowel sounds are normal. There is no distension.     Palpations: Abdomen is soft. There is no mass.     Tenderness: There is no abdominal tenderness. There is no guarding or rebound.     Comments: Abdominal incision well-healed.  Musculoskeletal:        General: No tenderness. Normal range of motion.     Cervical back: Normal range of motion and neck supple.  Skin:    General: Skin is warm.  Neurological:     Mental Status: He is alert and oriented to person, place, and time.  Psychiatric:        Mood and Affect: Affect normal.     LABORATORY DATA:  I have reviewed the data as listed Lab Results  Component Value Date   WBC 6.8 11/16/2020   HGB 11.2 (L) 11/16/2020   HCT 33.2 (L) 11/16/2020   MCV 83.6 11/16/2020   PLT 148 (L) 11/16/2020   Recent Labs    02/24/20 0819 03/09/20 0813 03/23/20 0850 04/06/20 0813 09/13/20 0930 10/12/20 0815 11/16/20 0938  NA 134* 132* 136   < > 130* 132* 129*   K 4.1 3.9 3.6   < > 4.5 4.8 4.5  CL 98 97* 100   < > 97* 99 96*  CO2 _0 < > _1 GLUCOSE 196* 260* 170*   < > 116* 210* 221*  BUN 33* 16 17   < > _2 CREATININE 0.96 0.96 0.97   < > 1.17 1.15 1.25*  CALCIUM 8.6* 8.6* 9.1   < > 8.8* 9.1 8.8*  GFRNONAA >60 >60 >60   < > >60 >60 >60  GFRAA >60 >60 >60  --   --   --   --   PROT 6.8 6.8 6.9   < > 6.8 7.0 6.8  ALBUMIN 3.6 3.6 3.8   < > 3.7 3.8 3.7  AST _3 < > _4 ALT _5 < > _6 ALKPHOS 59 64 59   < > 57 55 56  BILITOT 0.3 0.6 0.6   < > 0.6 0.4 0.6   < > = values in this interval not displayed.    RADIOGRAPHIC STUDIES: I have personally reviewed the radiological images as listed and agreed with the findings in the report. No results found.  ASSESSMENT & PLAN:   Cancer of ascending colon ([place]) #Right colon cancer stage IV- liver metastases; adenocarcinoma; on 5FU + avastin; CT MARCH 16th, 2022-continued partial response of the liver lesion; no new liver lesions noted.  STABLE mediastinal adenopathy; left suprahilar mass -STABLE  # CONTINUE to hold off chemo today 5FU with Avastin [given over all poor tolerance/Bil LE pain]; esp- given STABLE CT scan in March 2022. Will repeat CT scan in mid-June.    # Bil LE pain esp on exertion-clinically suspect PVD; Awaiting appt. With Dr.dew.   # Anemia- secondary- sec to chemo-hemoglobin on 11-12-STABLE.   #Hyponatremia sodium 129-over all STABLE-  ? Etiology; limit 1.5 lit free water; increase salt intake-.  # Back pain- MRI lumbar spine- July  2021-NEG for mets; severe arthritic changes; spinal canal stenosis-stable hydrocodone 1 day prn. STABLE.   # Poorly controlled diabetes blood sugars-better controlled as per patients.; over all stable;  FBG-221 STABLE.   # LEFT hilar/LUL-  Limited stage small cell cancer-s/p chemo-RT;March 2022-CT chest-STABLE   # HTN- 140s/ 70-on Avastin-stable continue current anti-HTNs.   # DISPOSITION:  # follow up  in 4 weeks-MD; labs-cbc/cmp/cea; UA; 5FU-Avastin; CT prior- -Dr.B      All questions were answered. The patient knows to call the clinic with any problems, questions or concerns.    Cammie Sickle, MD 11/16/2020 12:52 PM

## 2020-11-17 LAB — CEA: CEA: 3.7 ng/mL (ref 0.0–4.7)

## 2020-12-07 ENCOUNTER — Ambulatory Visit
Admission: RE | Admit: 2020-12-07 | Discharge: 2020-12-07 | Disposition: A | Discharge status: Home / Self Care | Payer: BC Managed Care – PPO | Source: Ambulatory Visit | Attending: Internal Medicine | Admitting: Internal Medicine

## 2020-12-07 ENCOUNTER — Other Ambulatory Visit: Payer: Self-pay

## 2020-12-07 DIAGNOSIS — C781 Secondary malignant neoplasm of mediastinum: Secondary | ICD-10-CM

## 2020-12-07 DIAGNOSIS — C801 Malignant (primary) neoplasm, unspecified: Secondary | ICD-10-CM | POA: Diagnosis present

## 2020-12-07 DIAGNOSIS — C182 Malignant neoplasm of ascending colon: Secondary | ICD-10-CM

## 2020-12-14 ENCOUNTER — Inpatient Hospital Stay: Payer: BC Managed Care – PPO | Attending: Internal Medicine

## 2020-12-14 ENCOUNTER — Inpatient Hospital Stay: Payer: BC Managed Care – PPO

## 2020-12-14 ENCOUNTER — Other Ambulatory Visit: Payer: Self-pay

## 2020-12-14 ENCOUNTER — Telehealth: Payer: Self-pay | Admitting: *Deleted

## 2020-12-14 ENCOUNTER — Inpatient Hospital Stay: Payer: BC Managed Care – PPO | Admitting: Internal Medicine

## 2020-12-14 ENCOUNTER — Inpatient Hospital Stay (HOSPITAL_BASED_OUTPATIENT_CLINIC_OR_DEPARTMENT_OTHER): Payer: BC Managed Care – PPO | Admitting: Hospice and Palliative Medicine

## 2020-12-14 VITALS — BP 120/71 | HR 64 | Temp 99.0°F | Resp 18

## 2020-12-14 DIAGNOSIS — C182 Malignant neoplasm of ascending colon: Secondary | ICD-10-CM | POA: Insufficient documentation

## 2020-12-14 DIAGNOSIS — I10 Essential (primary) hypertension: Secondary | ICD-10-CM | POA: Insufficient documentation

## 2020-12-14 DIAGNOSIS — M79662 Pain in left lower leg: Secondary | ICD-10-CM | POA: Diagnosis not present

## 2020-12-14 DIAGNOSIS — C781 Secondary malignant neoplasm of mediastinum: Secondary | ICD-10-CM

## 2020-12-14 DIAGNOSIS — E871 Hypo-osmolality and hyponatremia: Secondary | ICD-10-CM | POA: Diagnosis present

## 2020-12-14 DIAGNOSIS — C787 Secondary malignant neoplasm of liver and intrahepatic bile duct: Secondary | ICD-10-CM | POA: Diagnosis not present

## 2020-12-14 DIAGNOSIS — M79661 Pain in right lower leg: Secondary | ICD-10-CM | POA: Insufficient documentation

## 2020-12-14 DIAGNOSIS — Z85118 Personal history of other malignant neoplasm of bronchus and lung: Secondary | ICD-10-CM | POA: Diagnosis not present

## 2020-12-14 DIAGNOSIS — K610 Anal abscess: Secondary | ICD-10-CM | POA: Diagnosis present

## 2020-12-14 DIAGNOSIS — E1165 Type 2 diabetes mellitus with hyperglycemia: Secondary | ICD-10-CM | POA: Insufficient documentation

## 2020-12-14 DIAGNOSIS — E611 Iron deficiency: Secondary | ICD-10-CM

## 2020-12-14 DIAGNOSIS — L0291 Cutaneous abscess, unspecified: Secondary | ICD-10-CM

## 2020-12-14 DIAGNOSIS — M549 Dorsalgia, unspecified: Secondary | ICD-10-CM | POA: Diagnosis not present

## 2020-12-14 DIAGNOSIS — C801 Malignant (primary) neoplasm, unspecified: Secondary | ICD-10-CM

## 2020-12-14 DIAGNOSIS — D6481 Anemia due to antineoplastic chemotherapy: Secondary | ICD-10-CM | POA: Insufficient documentation

## 2020-12-14 LAB — CBC WITH DIFFERENTIAL/PLATELET
Abs Immature Granulocytes: 0.03 10*3/uL (ref 0.00–0.07)
Basophils Absolute: 0.1 10*3/uL (ref 0.0–0.1)
Basophils Relative: 1 %
Eosinophils Absolute: 0.2 10*3/uL (ref 0.0–0.5)
Eosinophils Relative: 3 %
HCT: 32.9 % — ABNORMAL LOW (ref 39.0–52.0)
Hemoglobin: 11.6 g/dL — ABNORMAL LOW (ref 13.0–17.0)
Immature Granulocytes: 0 %
Lymphocytes Relative: 19 %
Lymphs Abs: 1.5 10*3/uL (ref 0.7–4.0)
MCH: 28.2 pg (ref 26.0–34.0)
MCHC: 35.3 g/dL (ref 30.0–36.0)
MCV: 79.9 fL — ABNORMAL LOW (ref 80.0–100.0)
Monocytes Absolute: 0.7 10*3/uL (ref 0.1–1.0)
Monocytes Relative: 8 %
Neutro Abs: 5.4 10*3/uL (ref 1.7–7.7)
Neutrophils Relative %: 69 %
Platelets: 216 10*3/uL (ref 150–400)
RBC: 4.12 MIL/uL — ABNORMAL LOW (ref 4.22–5.81)
RDW: 14.1 % (ref 11.5–15.5)
WBC: 7.8 10*3/uL (ref 4.0–10.5)
nRBC: 0 % (ref 0.0–0.2)

## 2020-12-14 LAB — COMPREHENSIVE METABOLIC PANEL
ALT: 14 U/L (ref 0–44)
AST: 17 U/L (ref 15–41)
Albumin: 3.5 g/dL (ref 3.5–5.0)
Alkaline Phosphatase: 68 U/L (ref 38–126)
Anion gap: 12 (ref 5–15)
BUN: 15 mg/dL (ref 8–23)
CO2: 22 mmol/L (ref 22–32)
Calcium: 8.7 mg/dL — ABNORMAL LOW (ref 8.9–10.3)
Chloride: 91 mmol/L — ABNORMAL LOW (ref 98–111)
Creatinine, Ser: 1 mg/dL (ref 0.61–1.24)
GFR, Estimated: >60 mL/min (ref >60)
Glucose, Bld: 286 mg/dL — ABNORMAL HIGH (ref 70–99)
Potassium: 4.2 mmol/L (ref 3.5–5.1)
Sodium: 125 mmol/L — ABNORMAL LOW (ref 135–145)
Total Bilirubin: 0.7 mg/dL (ref 0.3–1.2)
Total Protein: 7.2 g/dL (ref 6.5–8.1)

## 2020-12-14 MED ORDER — LIDOCAINE-PRILOCAINE 2.5-2.5 % EX CREA: 1.0000 "application " | TOPICAL_CREAM | CUTANEOUS | 0 refills | Status: DC | PRN

## 2020-12-14 MED ORDER — SODIUM CHLORIDE 1 G PO TABS: 1.0000 g | ORAL_TABLET | Freq: Three times a day (TID) | ORAL | 0 refills | Status: DC

## 2020-12-14 MED ORDER — CLINDAMYCIN HCL 300 MG PO CAPS: 300.0000 mg | ORAL_CAPSULE | Freq: Four times a day (QID) | ORAL | 0 refills | Status: DC

## 2020-12-14 MED ORDER — SODIUM CHLORIDE 0.9 % IV SOLN
INTRAVENOUS | Status: AC
Start: 2020-12-14 — End: 2020-12-14
  Filled 2020-12-14 (×2): qty 250

## 2020-12-14 MED ORDER — SODIUM CHLORIDE 0.9% FLUSH
10.0000 mL | Freq: Once | INTRAVENOUS | Status: AC | PRN
  Administered 2020-12-14: 10 mL
  Filled 2020-12-14: qty 10

## 2020-12-14 MED ORDER — HEPARIN SOD (PORK) LOCK FLUSH 100 UNIT/ML IV SOLN
500.0000 [IU] | Freq: Once | INTRAVENOUS | Status: AC | PRN
  Administered 2020-12-14: 500 [IU]
  Filled 2020-12-14: qty 5

## 2020-12-14 MED ORDER — HYDROCODONE-ACETAMINOPHEN 5-325 MG PO TABS: ORAL_TABLET | ORAL | 0 refills | Status: DC

## 2020-12-14 NOTE — Telephone Encounter (Signed)
Phoned pt and spoke to wife, Neoma Laming. Informed her that pt needs to be evaluated in [facility] today. Neoma Laming agreeable to evaluation and will bring pt in at 1pm. Appointment scheduled.

## 2020-12-14 NOTE — Progress Notes (Signed)
Pt presents to [facility] with complaints of a mass to rectal area that he states he first noticed on Saturday 6/17. States area is very painful and hurts to sit. Rates pain 8/10. States that it has enlarged and become more painful since Saturday. Wife states that about a day ago, it began to ooze a yellowish/bloody discharge. She has had to place pads on him to control the drainage. Pt alsostates that he has not had an appetite for about a week or so.

## 2020-12-14 NOTE — Progress Notes (Signed)
Symptom Management [place]  [phone] [phone]  Patient Care Team: Laneta Simmers, NP (Inactive) as PCP - General (Nurse Practitioner) Telford Nab, RN as Registered Nurse Clent Jacks, RN as Oncology Nurse Navigator   Name of the patient: Kevin Mckay  [id number]  10/20/1950   Date of visit: 12/14/20  Reason for Consult: Mr. 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  Patient saw Dr. Rogue Bussing on 11/16/2020 at which time he complained of bilateral knee and lower extremity pain and was pending referral to vascular.  He has been on chemo holiday since March 2022 due to poor tolerance.  CTs of the chest, abdomen, and pelvis on 12/07/2020 showed no evidence of progressive metastatic disease with stable changes from previous right hemicolectomy and scattered diverticular changes.  Patient presents to [facility] today for evaluation of the painful mass near his anus that is draining bloody/purulent fluid.  Patient reports that he has had some perirectal pain over the past week that started with an episode of diarrhea and that the pain has worsened in intensity with the development of a draining mass over the past several days.  He denies fever or chills.  Has occasional nausea and has not been eating much this past week.  Denies any neurologic complaints. Denies recent fevers or illnesses. Denies any easy bleeding or bruising. Denies chest pain. Denies urinary complaints. Patient offers no further specific complaints today.  PAST MEDICAL HISTORY: Past Medical History:  Diagnosis Date   Asthma    Cancer ([place])    Metastatic small cell lung cancer   Cerebral aneurysm    Chronic painful diabetic neuropathy ([place])     Depression    Diabetes mellitus without complication ([place])    Essential hypertension    History of kidney stones    Occasional tremors    Tobacco use     PAST SURGICAL HISTORY:  Past Surgical History:  Procedure Laterality Date   ABDOMINAL SURGERY     age 10. trauma surgery due to forklift injury- liver and spleen   COLONOSCOPY WITH PROPOFOL N/A 03/27/2019   Procedure: COLONOSCOPY WITH PROPOFOL;  Surgeon: Jonathon Bellows, MD;  Location: [facility] ENDOSCOPY;  Service: Gastroenterology;  Laterality: N/A;   COLOSTOMY REVISION Right 05/04/2019   Procedure: COLON RESECTION RIGHT-right hemicolectomy-open;  Surgeon: Fredirick Maudlin, MD;  Location: [facility] ORS;  Service: General;  Laterality: Right;   ESOPHAGOGASTRODUODENOSCOPY (EGD) WITH PROPOFOL N/A 03/27/2019   Procedure: ESOPHAGOGASTRODUODENOSCOPY (EGD) WITH PROPOFOL;  Surgeon: Jonathon Bellows, MD;  Location: [facility] ENDOSCOPY;  Service: Gastroenterology;  Laterality: N/A;   IR GENERIC HISTORICAL  05/31/2016   IR ANGIO VERTEBRAL SEL VERTEBRAL BILAT MOD SED 05/31/2016 Consuella Lose, MD [facility]-INTERV RAD   IR GENERIC HISTORICAL  05/31/2016   IR ANGIO INTRA EXTRACRAN SEL INTERNAL CAROTID BILAT MOD SED 05/31/2016 Consuella Lose, MD [facility]INTER[facility]   PORTACATH PLACEMENT Right 02/04/2019   Procedure: INSERTION PORT-A-CATH;  Surgeon: [name], MD;  Location: [facility] ORS;  Service: General;  Laterality: Right;   THORACOTOMY Left 01/22/2019   Procedure: PRE OP BRONCH LEFT ANTERIOR THORACOTOMY WITH BIOPSY OF MEDIASTINAL MASS;  Surgeon: [name], MD;  Location: [facility] ORS;  Service: General;  Laterality: Left;    HEMATOLOGY/ONCOLOGY HISTORY:  Oncology History Overview Note  # July 2020- SMALL CELL CA METASTATIC TO MEDIASTINAL LN [  open Bx; Dr.Oaks]; TxN2M0; July 2nd 2020-PET- 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  MRI brain negative  # aug 17th 2020-carbo etoposide-RT [? 8/19]; #4 cycle carbo-Etop [finished dec 30th,2020]  #August 2020 iron deficient  anemia-question etiology; IV Feraheme [no colonoscopy]  # OCT 2020- colo/ Cecal adeno ca; MRI liver- 1 cm enhancing lesion "metastasis" [Dr.Anna/Dr.Cannon]-difficulty biopsy.  November 02-2019 right hemicolectomy- STAGE III [pT3pN1 (2/13LN)]- HOLD adjuvant therapy. MMR- INTACT/LOW  #May 2021-liver biopsy-possible adenocarcinoma; colorectal origin.  Stage IV colon cancer  #May 24-2021-FOLFOX with Avastin; AUG 31st,2021- STABLE liver lesion; SEP 1st, 2021- Discontinue LV+Ox sec to severe fatigue/PN; cont 5FU CIV + Avastin  # COPD/  DM-2- on OHA/ smoker/ PVD/peripheral neuropathy  # History of alcohol abuse/quit 2014/ Hx of abdominal trauma [at 21y]  #July 2021-foundation 1 NGS [liver metastases]- RAS WILD TYPE **  # Jan 1st, 2022-Covid infection [prior vaccination; monoclonal infusion-not hospitalized]  # DIAGNOSIS:   # SMALL CELL CA-limited stage-status post chemoradiation;   # colon cancer-stage IV-solitary liver lesion  GOALS: Control  CURRENT/MOST RECENT THERAPY : FOLFOX with Avastin   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 ([place])  01/29/2019 Initial Diagnosis   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 [facility])   02/09/2019 - 06/24/2019 Chemotherapy   The patient had palonosetron (ALOXI) injection 0.25 mg, 0.25 mg, Intravenous,  Once, 4 of 4 cycles Administration: 0.25 mg (02/09/2019), 0.25 mg (03/03/2019), 0.25 mg (06/02/2019), 0.25 mg (06/22/2019) CARBOplatin (PARAPLATIN) 410 mg in sodium chloride 0.9 % 250 mL chemo infusion, 410 mg (100 % of original dose 414 mg), Intravenous,  Once, 4 of 4 cycles Dose modification:   (original dose 414 mg, Cycle 1) Administration: 410 mg (02/09/2019), 410 mg (03/03/2019), 410 mg (06/02/2019), 410 mg (06/22/2019) etoposide (VEPESID) 180 mg in sodium chloride 0.9 % 500 mL chemo infusion, 100 mg/m2 = 180 mg, Intravenous,  Once, 4 of 4 cycles Administration: 180 mg (02/09/2019), 180  mg (02/10/2019), 180 mg (02/11/2019), 180 mg (03/03/2019), 180 mg (03/04/2019), 180 mg (03/05/2019), 180 mg (06/02/2019), 180 mg (06/03/2019), 180 mg (06/04/2019), 180 mg (06/22/2019), 180 mg (06/23/2019), 180 mg (06/24/2019) fosaprepitant (EMEND) 150 mg, dexamethasone (DECADRON) 6 mg in sodium chloride 0.9 % 145 mL IVPB, , Intravenous,  Once, 2 of 2 cycles Administration:  (06/02/2019),  (06/22/2019)   for chemotherapy treatment.     Cancer of ascending colon ([place])  05/04/2019 Initial Diagnosis   Cancer of ascending colon ([place])    11/30/2019 -  Chemotherapy    Patient is on Treatment Plan: COLORECTAL FOLFOX + BEVACIZUMAB Q14D         ALLERGIES:  is allergic to ferumoxytol, amoxicillin, and codeine.  MEDICATIONS:  Current Outpatient Medications  Medication Sig Dispense Refill   amLODipine (NORVASC) 10 MG tablet Take 10 mg by mouth every morning.      budesonide-formoterol (SYMBICORT) 160-4.5 MCG/ACT inhaler Inhale 2 puffs into the lungs 2 (two) times daily.     DULoxetine (CYMBALTA) 30 MG capsule Take 30 mg by mouth every morning.      gabapentin (NEURONTIN) 800 MG tablet Take 800 mg by mouth 3 (three) times daily.     HYDROcodone-acetaminophen (NORCO/VICODIN) 5-325 MG tablet Tale one tablet a day as needed for pain. 45 tablet 0   insulin glargine (LANTUS) 100 UNIT/ML injection Inject into the skin daily. Units based By sliding scale     Ipratropium-Albuterol (COMBIVENT) 20-100 MCG/ACT AERS respimat Inhale into the lungs.     lidocaine-prilocaine (EMLA)  cream Apply 1 application topically as needed. 30-45 mins prior to port access. 30 g 0   linaclotide (LINZESS) 145 MCG CAPS capsule Take 145 mcg by mouth daily before breakfast.     lisinopril-hydrochlorothiazide (ZESTORETIC) 10-12.5 MG tablet Take 1 tablet by mouth daily.     lovastatin (MEVACOR) 20 MG tablet Take 20 mg by mouth every evening.     metFORMIN (GLUCOPHAGE) 1000 MG tablet Take 1,000 mg by mouth 2 (two) times daily with a meal.       omeprazole (PRILOSEC) 40 MG capsule Take 1 capsule (40 mg total) by mouth daily. 90 capsule 1   ondansetron (ZOFRAN) 8 MG tablet Take 1 tablet (8 mg total) by mouth every 8 (eight) hours as needed for nausea or vomiting (start 3 days; after chemo). 40 tablet 1   polyethylene glycol powder (MIRALAX) 17 GM/SCOOP powder Mix full container in 64 ounces of Gatorade or other clear liquid. NO RED Liquids 238 g 0   prazosin (MINIPRESS) 1 MG capsule Take 1 mg by mouth at bedtime.     pyridOXINE (VITAMIN B-6) 100 MG tablet Take 100 mg by mouth daily.     sitaGLIPtin (JANUVIA) 25 MG tablet Take 25 mg by mouth daily.     vitamin B-12 (CYANOCOBALAMIN) 100 MCG tablet Take 100 mcg by mouth daily.     No current facility-administered medications for this visit.   Facility-Administered Medications Ordered in Other Visits  Medication Dose Route Frequency Provider Last Rate Last Admin   heparin lock flush 100 unit/mL  500 Units Intracatheter Once PRN Cammie Sickle, MD       sodium chloride flush (NS) 0.9 % injection 10 mL  10 mL Intravenous PRN Cammie Sickle, MD   10 mL at 09/13/20 0943   sodium chloride flush (NS) 0.9 % injection 10 mL  10 mL Intracatheter Once PRN Cammie Sickle, MD        VITAL SIGNS: BP 120/71   Pulse 64   Temp 99 F (37.2 C) (Tympanic)   Resp 18   SpO2 97%  There were no vitals filed for this visit.  Estimated body mass index is 22.79 kg/m as calculated from the following:   Height as of 10/12/20: _0  (1.727 m).   Weight as of 11/16/20: 149 lb 14.4 oz (68 kg).  LABS: CBC:    Component Value Date/Time   WBC 7.8 12/14/2020 1316   HGB 11.6 (L) 12/14/2020 1316   HGB 14.7 10/15/2011 1319   HCT 32.9 (L) 12/14/2020 1316   HCT 42.9 10/15/2011 1319   PLT 216 12/14/2020 1316   PLT 178 10/15/2011 1319   MCV 79.9 (L) 12/14/2020 1316   MCV 90 10/15/2011 1319   NEUTROABS 5.4 12/14/2020 1316   LYMPHSABS 1.5 12/14/2020 1316   MONOABS 0.7 12/14/2020 1316    EOSABS 0.2 12/14/2020 1316   BASOSABS 0.1 12/14/2020 1316   Comprehensive Metabolic Panel:    Component Value Date/Time   NA 129 (L) 11/16/2020 0938   K 4.5 11/16/2020 0938   CL 96 (L) 11/16/2020 0938   CO2 23 11/16/2020 0938   BUN 16 11/16/2020 0938   CREATININE 1.25 (H) 11/16/2020 0938   GLUCOSE 221 (H) 11/16/2020 0938   CALCIUM 8.8 (L) 11/16/2020 0938   AST 20 11/16/2020 0938   ALT 15 11/16/2020 0938   ALKPHOS 56 11/16/2020 0938   BILITOT 0.6 11/16/2020 0938   PROT 6.8 11/16/2020 0938   ALBUMIN 3.7 11/16/2020 3343  RADIOGRAPHIC STUDIES: CT Abdomen Pelvis Wo Contrast  Result Date: 12/07/2020 CLINICAL DATA:  Gastrointestinal cancer, assess treatment response. Right hemicolectomy done in November 2020 for colon cancer. History of small cell lung cancer. EXAM: CT CHEST, ABDOMEN AND PELVIS WITHOUT CONTRAST TECHNIQUE: Multidetector CT imaging of the chest, abdomen and pelvis was performed following the standard protocol without IV contrast. COMPARISON:  Prior CTs 09/08/2020 and 06/07/2020. FINDINGS: CT CHEST FINDINGS Cardiovascular: Right subclavian central venous catheter projects to the superior cavoatrial junction. There is diffuse atherosclerosis of the aorta, great vessels and coronary arteries. The heart size is normal. There is no pericardial effusion. Mediastinum/Nodes: There are no enlarged mediastinal, hilar or axillary lymph nodes. Hilar assessment is limited by the lack of intravenous contrast, although the hilar contours appear unchanged. The thyroid gland, trachea and esophagus demonstrate no significant findings. Lungs/Pleura: There is no pleural effusion. Stable post treatment changes in the left perihilar region. Underlying mild centrilobular emphysema and scattered centrilobular ground-glass pulmonary nodules. No enlarging solid pulmonary nodules. Musculoskeletal/Chest wall: No chest wall mass or suspicious osseous findings. CT ABDOMEN AND PELVIS FINDINGS Hepatobiliary: The  previously demonstrated subcapsular lesion laterally in the dome of the right hepatic lobe (image 52/2, biopsy-proven metastasis) is not well seen on this noncontrast study. No new or enlarging lesions are identified. No evidence of gallstones, gallbladder wall thickening or biliary dilatation. Pancreas: The pancreatic head appears stable. Chronic atrophy or postsurgical absence of the pancreatic body and tail, unchanged. Spleen: Normal in size without focal abnormality. Adrenals/Urinary Tract: Both adrenal glands appear normal. The kidneys appear unchanged. There are renovascular calcifications bilaterally. No evidence of urinary tract calculus or hydronephrosis. The bladder appears unremarkable. Stomach/Bowel: Enteric contrast was administered and has passed into the distal small bowel. The stomach appears unremarkable for its degree of distension. No evidence of bowel wall thickening, distention or surrounding inflammatory change. Stable postsurgical changes from previous right hemicolectomy. Gas and fluid are present throughout the colon and there are scattered diverticular changes distally. Vascular/Lymphatic: There are no enlarged abdominal or pelvic lymph nodes. Diffuse aortic and branch vessel atherosclerosis. Numerous venous collaterals are noted in the left upper quadrant. Reproductive: The prostate gland and seminal vesicles appear unremarkable. Other: Postsurgical changes in the anterior abdominal wall. No ascites or peritoneal nodularity. Musculoskeletal: No acute or significant osseous findings. Degenerative disc disease at L5-S1 with chronic biforaminal narrowing. IMPRESSION: 1. No evidence of progressive metastatic disease in the chest, abdomen or pelvis on noncontrast imaging. The known lesion in the dome of the right hepatic lobe is not well visualized. 2. Stable treatment changes in the left perihilar region without signs of local recurrence. 3. Stable postsurgical changes as described. Distal  colonic diverticulosis without acute inflammation. 4. Aortic Atherosclerosis (ICD10-I70.0) and Emphysema (ICD10-J43.9). Electronically Signed   By: Richardean Sale M.D.   On: 12/07/2020 21:37   CT Chest Wo Contrast  Result Date: 12/07/2020 CLINICAL DATA:  Gastrointestinal cancer, assess treatment response. Right hemicolectomy done in November 2020 for colon cancer. History of small cell lung cancer. EXAM: CT CHEST, ABDOMEN AND PELVIS WITHOUT CONTRAST TECHNIQUE: Multidetector CT imaging of the chest, abdomen and pelvis was performed following the standard protocol without IV contrast. COMPARISON:  Prior CTs 09/08/2020 and 06/07/2020. FINDINGS: CT CHEST FINDINGS Cardiovascular: Right subclavian central venous catheter projects to the superior cavoatrial junction. There is diffuse atherosclerosis of the aorta, great vessels and coronary arteries. The heart size is normal. There is no pericardial effusion. Mediastinum/Nodes: There are no enlarged mediastinal, hilar or axillary lymph  nodes. Hilar assessment is limited by the lack of intravenous contrast, although the hilar contours appear unchanged. The thyroid gland, trachea and esophagus demonstrate no significant findings. Lungs/Pleura: There is no pleural effusion. Stable post treatment changes in the left perihilar region. Underlying mild centrilobular emphysema and scattered centrilobular ground-glass pulmonary nodules. No enlarging solid pulmonary nodules. Musculoskeletal/Chest wall: No chest wall mass or suspicious osseous findings. CT ABDOMEN AND PELVIS FINDINGS Hepatobiliary: The previously demonstrated subcapsular lesion laterally in the dome of the right hepatic lobe (image 52/2, biopsy-proven metastasis) is not well seen on this noncontrast study. No new or enlarging lesions are identified. No evidence of gallstones, gallbladder wall thickening or biliary dilatation. Pancreas: The pancreatic head appears stable. Chronic atrophy or postsurgical absence of  the pancreatic body and tail, unchanged. Spleen: Normal in size without focal abnormality. Adrenals/Urinary Tract: Both adrenal glands appear normal. The kidneys appear unchanged. There are renovascular calcifications bilaterally. No evidence of urinary tract calculus or hydronephrosis. The bladder appears unremarkable. Stomach/Bowel: Enteric contrast was administered and has passed into the distal small bowel. The stomach appears unremarkable for its degree of distension. No evidence of bowel wall thickening, distention or surrounding inflammatory change. Stable postsurgical changes from previous right hemicolectomy. Gas and fluid are present throughout the colon and there are scattered diverticular changes distally. Vascular/Lymphatic: There are no enlarged abdominal or pelvic lymph nodes. Diffuse aortic and branch vessel atherosclerosis. Numerous venous collaterals are noted in the left upper quadrant. Reproductive: The prostate gland and seminal vesicles appear unremarkable. Other: Postsurgical changes in the anterior abdominal wall. No ascites or peritoneal nodularity. Musculoskeletal: No acute or significant osseous findings. Degenerative disc disease at L5-S1 with chronic biforaminal narrowing. IMPRESSION: 1. No evidence of progressive metastatic disease in the chest, abdomen or pelvis on noncontrast imaging. The known lesion in the dome of the right hepatic lobe is not well visualized. 2. Stable treatment changes in the left perihilar region without signs of local recurrence. 3. Stable postsurgical changes as described. Distal colonic diverticulosis without acute inflammation. 4. Aortic Atherosclerosis (ICD10-I70.0) and Emphysema (ICD10-J43.9). Electronically Signed   By: Richardean Sale M.D.   On: 12/07/2020 21:37    PERFORMANCE STATUS (ECOG) : 1 - Symptomatic but completely ambulatory  Review of Systems Unless otherwise noted, a complete review of systems is negative.  Physical Exam General:  NAD Pulmonary: Unlabored Abdomen: soft, nontender GU: no suprapubic tenderness Extremities: no edema, no joint deformities, chronic discoloration bilateral lower extremities Skin: Fluctuant erythematous indurated perianal area with serosanguineous drainage Neurological: Weakness but otherwise nonfocal    Exam was chaperoned by Gray Bernhardt, RN  Assessment and Plan- Patient is a 70 y.o. 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   Perianal abscess -patient has documented allergy to amoxicillin.  We will start on clindamycin 300 mg 4 times daily x7 days. Will refer to surgery with Dr. Celine Ahr for consideration of I&D. Discussed with surgical PA, Shon Millet, and patient scheduled for clinic evaluation tomorrow. Will refill Norco PRN for pain. Patient instructed to seek care in ER if symptoms worsening or development of fever/chills.   Hyponatremia -patient has been chronically hyponatremic over the past several months.  Unclear etiology.  However, hyponatremia is worse today.  Discussed with Dr. Rogue Bussing and will give normal saline 1 L by IV  given probable dehydration component.  We will start p.o. sodium replacement for chronic hyponatremia.  Patient to see Dr. Celine Ahr tomorrow at 10 AM and then will follow-up with Dr. Rogue Bussing next week.  Case and plan discussed with Dr. Rogue Bussing  Patient expressed understanding and was in agreement with this plan. He also understands that He can call clinic at any time with any questions, concerns, or complaints.   Thank you for allowing me to participate in the care of this very pleasant patient.   Time Total: 25 minutes  Visit consisted of counseling and education dealing with the complex and emotionally  intense issues of symptom management and palliative care in the setting of serious and potentially life-threatening illness.Greater than 50%  of this time was spent counseling and coordinating care related to the above assessment and plan.  Signed by: Altha Harm, PhD, NP-C

## 2020-12-14 NOTE — Telephone Encounter (Signed)
Patient needs [facility] evaluation. Kevin Mckay will you reach out to patient.

## 2020-12-14 NOTE — Telephone Encounter (Addendum)
Patient wife called reporting that patient was unable to come in for appointment this morning due to a situation that they need help with. She reports that patient developed a mass around his rectum which has been very painful and that she had him to saok in epsom salt in the tub. He now has a foul smelling yellow liquid with blood in it drainage for the area and she has pads on him to catch the drainage. She feels he may have a fever as he is warm to the touch, but has not checked his temp. She is asking what to do. He also is not eating and has not been eating or drinking for the past week and is very weak. Please advise

## 2020-12-15 ENCOUNTER — Ambulatory Visit: Payer: BC Managed Care – PPO | Admitting: Physician Assistant

## 2020-12-15 ENCOUNTER — Other Ambulatory Visit: Payer: Self-pay | Admitting: Hospice and Palliative Medicine

## 2020-12-15 ENCOUNTER — Encounter: Payer: Self-pay | Admitting: Physician Assistant

## 2020-12-15 ENCOUNTER — Telehealth: Payer: Self-pay | Admitting: *Deleted

## 2020-12-15 VITALS — BP 104/59 | HR 97 | Temp 98.5°F | Ht 68.0 in | Wt 138.2 lb

## 2020-12-15 DIAGNOSIS — L0231 Cutaneous abscess of buttock: Secondary | ICD-10-CM | POA: Diagnosis not present

## 2020-12-15 MED ORDER — HYDROCODONE-ACETAMINOPHEN 5-325 MG PO TABS: 1.0000 | ORAL_TABLET | Freq: Four times a day (QID) | ORAL | 0 refills | Status: DC | PRN

## 2020-12-15 NOTE — Patient Instructions (Signed)
If you have any concerns or questions, please feel free to call our office. See follow up appointment below.  Please pick up antibiotics from your pharmacy.    Incision and Drainage, Care After This sheet gives you information about how to care for yourself after your procedure. Your health care provider may also give you more specific instructions. If you have problems or questions, contact your health careprovider. What can I expect after the procedure? After the procedure, it is common to have: Pain or discomfort around the incision site. Blood, fluid, or pus (drainage) from the incision. Redness and firm skin around the incision site. Follow these instructions at home: Medicines Take over-the-counter and prescription medicines only as told by your health care provider. If you were prescribed an antibiotic medicine, use or take it as told by your health care provider. Do not stop using the antibiotic even if you start to feel better. Wound care Follow instructions from your health care provider about how to take care of your wound. Make sure you: Wash your hands with soap and water before and after you change your bandage (dressing). If soap and water are not available, use hand sanitizer. Change your dressing and packing as told by your health care provider. If your dressing is dry or stuck when you try to remove it, moisten or wet the dressing with saline or water so that it can be removed without harming your skin or tissues. If your wound is packed, leave it in place until your health care provider tells you to remove it. To remove the packing, moisten or wet the packing with saline or water so that it can be removed without harming your skin or tissues. Leave stitches (sutures), skin glue, or adhesive strips in place. These skin closures may need to stay in place for 2 weeks or longer. If adhesive strip edges start to loosen and curl up, you may trim the loose edges. Do not remove  adhesive strips completely unless your health care provider tells you to do that. Check your wound every day for signs of infection. Check for: More redness, swelling, or pain. More fluid or blood. Warmth. Pus or a bad smell. If you were sent home with a drain tube in place, follow instructions from your health care provider about: How to empty it. How to care for it at home.  General instructions Rest the affected area. Do not take baths, swim, or use a hot tub until your health care provider approves. Ask your health care provider if you may take showers. You may only be allowed to take sponge baths. Return to your normal activities as told by your health care provider. Ask your health care provider what activities are safe for you. Your health care provider may put you on activity or lifting restrictions. The incision will continue to drain. It is normal to have some clear or slightly bloody drainage. The amount of drainage should lessen each day. Do not apply any creams, ointments, or liquids unless you have been told to by your health care provider. Keep all follow-up visits as told by your health care provider. This is important. Contact a health care provider if: Your cyst or abscess returns. You have a fever or chills. You have more redness, swelling, or pain around your incision. You have more fluid or blood coming from your incision. Your incision feels warm to the touch. You have pus or a bad smell coming from your incision. You have red streaks  above or below the incision site. Get help right away if: You have severe pain or bleeding. You cannot eat or drink without vomiting. You have decreased urine output. You become short of breath. You have chest pain. You cough up blood. The affected area becomes numb or starts to tingle. These symptoms may represent a serious problem that is an emergency. Do not wait to see if the symptoms will go away. Get medical help right away.  Call your local emergency services (911 in the [place] Do not drive yourself to the hospital. Summary After this procedure, it is common to have fluid, blood, or pus coming from the surgery site. Follow all home care instructions. You will be told how to take care of your incision, how to check for infection, and how to take medicines. If you were prescribed an antibiotic medicine, take it as told by your health care provider. Do not stop taking the antibiotic even if you start to feel better. Contact a health care provider if you have increased redness, swelling, or pain around your incision. Get help right away if you have chest pain, you vomit, you cough up blood, or you have shortness of breath. Keep all follow-up visits as told by your health care provider. This is important. This information is not intended to replace advice given to you by your health care provider. Make sure you discuss any questions you have with your healthcare provider. Document Revised: 05/12/2018 Document Reviewed: 05/12/2018 Elsevier Patient Education  2022 [employer].

## 2020-12-15 NOTE — Progress Notes (Signed)
Procedure Note  Date: 12/15/20 10:40 AM  Preforming Provider: Edison Simon, PA-C  Pre-Procedure Diagnosis: Gluteal Abscess  Post-Procedure Diagnosis: Gluteal Abscess  Indication: Kevin Mckay is a 70 y.o. 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 He presents to the clinic with ~1 week history of pain and welling to his left gluteal crease with associated redness and new foul smelling drainage concerning for abscess.   Anesthesia: 12 ccs of 1% lidocaine with epinephrine  Findings: Gross Purulence, Cultured  Details of Procedure:  All risks, benefits, and alternatives to above procedure(s) were discussed with the patient and informed consent was obtained. His left gluteal crease was prepped and draped in standard sterile fashion. 12 ccs of 1% lidocaine with epinephrine with injected intradermally and adequate anesthesia achieved. Using an 11 blade scalpel, an elliptical incision was made over and area of fluctuance and copious amounts of purulence was expressed. This was sent for culture. Kelly forceps were then use to ensure all loculations were broken up. Hemostasis as obtained with direct pressure. There was no evidence of fistula present. The wound was then irrigated with copious amount of NS and packed with 1/2 inch iodoform gauze. The patient tolerated this well without immediate complications. All sharps were accounted for and disposed of properly at the end of the procedure.   Complications: None apparent  Plan:  -- Reviewed wound care and hygiene with patient and his wife -- Complete Abx as prescribed by [facility] NP; will follow up Cx -- He has pain medication Rx as well -- Follow up on Wednesday 06/29 for re-check   --  Edison Simon, PA-C [facility] 12/15/2020, 10:40 AM [phone] M-F: 7am - 4pm

## 2020-12-15 NOTE — Addendum Note (Signed)
Addended by: Vanice Sarah on: 12/15/2020 10:49 AM   Modules accepted: Orders

## 2020-12-15 NOTE — Telephone Encounter (Signed)
The Norcorx needs directions clarified as there are 2 sets of directions on it, if the prescription is to be 1 tablet daily, the qty needs to be changed to #30 tabs

## 2020-12-15 NOTE — Telephone Encounter (Signed)
Call placed to pharmacy to see if Norco Prescription was received and if they are satisfied with it. I had to leave message on voice mail for them to call me back

## 2020-12-16 ENCOUNTER — Inpatient Hospital Stay: Payer: BC Managed Care – PPO

## 2020-12-21 ENCOUNTER — Other Ambulatory Visit: Payer: Self-pay

## 2020-12-21 ENCOUNTER — Encounter: Payer: Self-pay | Admitting: Internal Medicine

## 2020-12-21 ENCOUNTER — Encounter: Payer: Self-pay | Admitting: Physician Assistant

## 2020-12-21 ENCOUNTER — Ambulatory Visit (INDEPENDENT_AMBULATORY_CARE_PROVIDER_SITE_OTHER): Payer: BC Managed Care – PPO | Admitting: Physician Assistant

## 2020-12-21 VITALS — BP 154/77 | HR 78 | Temp 98.4°F | Ht 68.0 in | Wt 139.8 lb

## 2020-12-21 DIAGNOSIS — L0231 Cutaneous abscess of buttock: Secondary | ICD-10-CM

## 2020-12-21 DIAGNOSIS — Z09 Encounter for follow-up examination after completed treatment for conditions other than malignant neoplasm: Secondary | ICD-10-CM

## 2020-12-21 NOTE — Patient Instructions (Signed)
Start using the 1/4 inch packing. Pack all areas of the wound and cover with dry gauze dressing.  Pack the area until it is too shallow to accept anymore packing.  Follow up here in 2 weeks. Call us to schedule.

## 2020-12-21 NOTE — Progress Notes (Signed)
[facility] POST-OP OFFICE VISIT  12/21/2020  HPI: Kevin Mckay is a 70 y.o. male 6 days s/p incision and drainage of left gluteal abscess  He is doing better, some soreness No fever, chills No further drainage His wife has been doing a great job with packing the wounds He is currently on Clindamycin; has 1-2 days left Cx without growth to date  Vital signs: BP (!) 154/77   Pulse 78   Temp 98.4 F (36.9 C)   Ht 5\' 8"  (1.727 m)   Wt 139 lb 12.8 oz (63.4 kg)   SpO2 97%   BMI 21.26 kg/m    Physical Exam: Constitutional: Well appearing male, NAD Skin: Approximately 2x1 cm wound to the left gluteal crease lateral to the anus, there is surrounding induration, wound bed is starting to granulate, no drainage, no fluctuance, no erythema   Assessment/Plan: This is a 70 y.o. male 6 days s/p incision and drainage of left gluteal abscess   - Continue wound care: Pack daily + as needed; instructions reviewed; supplies were provided  - Complete Abx; no need for further prescription at this time  - Reviewed signs/symptoms of worsening  - He will rtc in 2 weeks of wound check   -- Edison Simon, PA-C [facility] 12/21/2020, 11:14 AM [phone] M-F: 7am - 4pm

## 2020-12-22 ENCOUNTER — Inpatient Hospital Stay: Payer: BC Managed Care – PPO | Admitting: Internal Medicine

## 2020-12-22 ENCOUNTER — Inpatient Hospital Stay: Payer: BC Managed Care – PPO

## 2020-12-22 DIAGNOSIS — K921 Melena: Secondary | ICD-10-CM

## 2020-12-22 DIAGNOSIS — C781 Secondary malignant neoplasm of mediastinum: Secondary | ICD-10-CM

## 2020-12-22 DIAGNOSIS — E611 Iron deficiency: Secondary | ICD-10-CM

## 2020-12-22 DIAGNOSIS — D649 Anemia, unspecified: Secondary | ICD-10-CM

## 2020-12-22 DIAGNOSIS — C801 Malignant (primary) neoplasm, unspecified: Secondary | ICD-10-CM

## 2020-12-22 DIAGNOSIS — C182 Malignant neoplasm of ascending colon: Secondary | ICD-10-CM

## 2020-12-22 LAB — CBC WITH DIFFERENTIAL/PLATELET
Abs Immature Granulocytes: 0.04 10*3/uL (ref 0.00–0.07)
Basophils Absolute: 0.1 10*3/uL (ref 0.0–0.1)
Basophils Relative: 1 %
Eosinophils Absolute: 0.3 10*3/uL (ref 0.0–0.5)
Eosinophils Relative: 4 %
HCT: 33.9 % — ABNORMAL LOW (ref 39.0–52.0)
Hemoglobin: 11.5 g/dL — ABNORMAL LOW (ref 13.0–17.0)
Immature Granulocytes: 1 %
Lymphocytes Relative: 24 %
Lymphs Abs: 1.6 10*3/uL (ref 0.7–4.0)
MCH: 27.9 pg (ref 26.0–34.0)
MCHC: 33.9 g/dL (ref 30.0–36.0)
MCV: 82.3 fL (ref 80.0–100.0)
Monocytes Absolute: 0.4 10*3/uL (ref 0.1–1.0)
Monocytes Relative: 6 %
Neutro Abs: 4.4 10*3/uL (ref 1.7–7.7)
Neutrophils Relative %: 64 %
Platelets: 263 10*3/uL (ref 150–400)
RBC: 4.12 MIL/uL — ABNORMAL LOW (ref 4.22–5.81)
RDW: 14.6 % (ref 11.5–15.5)
WBC: 6.7 10*3/uL (ref 4.0–10.5)
nRBC: 0 % (ref 0.0–0.2)

## 2020-12-22 LAB — COMPREHENSIVE METABOLIC PANEL
ALT: 19 U/L (ref 0–44)
AST: 22 U/L (ref 15–41)
Albumin: 3.6 g/dL (ref 3.5–5.0)
Alkaline Phosphatase: 59 U/L (ref 38–126)
Anion gap: 9 (ref 5–15)
BUN: 14 mg/dL (ref 8–23)
CO2: 24 mmol/L (ref 22–32)
Calcium: 9.1 mg/dL (ref 8.9–10.3)
Chloride: 94 mmol/L — ABNORMAL LOW (ref 98–111)
Creatinine, Ser: 1.11 mg/dL (ref 0.61–1.24)
GFR, Estimated: >60 mL/min (ref >60)
Glucose, Bld: 294 mg/dL — ABNORMAL HIGH (ref 70–99)
Potassium: 5 mmol/L (ref 3.5–5.1)
Sodium: 127 mmol/L — ABNORMAL LOW (ref 135–145)
Total Bilirubin: 0.4 mg/dL (ref 0.3–1.2)
Total Protein: 7 g/dL (ref 6.5–8.1)

## 2020-12-22 LAB — SAMPLE TO BLOOD BANK

## 2020-12-22 LAB — ANAEROBIC AND AEROBIC CULTURE

## 2020-12-22 MED ORDER — HEPARIN SOD (PORK) LOCK FLUSH 100 UNIT/ML IV SOLN
500.0000 [IU] | Freq: Once | INTRAVENOUS | Status: AC | PRN
  Administered 2020-12-22: 500 [IU]
  Filled 2020-12-22: qty 5

## 2020-12-22 NOTE — Assessment & Plan Note (Addendum)
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 The known lesion in thedome of the right hepatic lobe is not well visualized.  Left perihilar treatment changes no evidence of local recurrence.   # CONTINUE to hold off chemo today 5FU with Avastin [given over all poor tolerance/Bil LE pain]; especially perianal abscess s/p drainage.  # LEFT hilar/LUL-  Limited stage small cell cancer-s/p chemo-RT;june  2022-CT chest-STABLE   #Perianal/rectal abscess-s/p drainage followed by surgery [predisposed by poorly controlled diabetes]; Dr. Celine Ahr; on antibiotics.  Improving  # Bil LE pain esp on exertion-clinically suspect PVD; Awaiting appt. With Dr.dew.   # Anemia- secondary- sec to chemo-hemoglobin on 11-12-stable  #Hyponatremia sodium 127-over all STABLE-  ? Etiology; limit 1.5 lit free water; increase salt intake-  # Back pain- MRI lumbar spine- July 2021-NEG for mets; severe arthritic changes; spinal canal stenosis-stable hydrocodone 1 day prn.  Stable  # Poorly controlled diabetes blood sugars-better controlled as per patients-overall stable [id number] STABLE.   # HTN- 140s/ 70-on Avastin-stable continue current anti-HTNs.   # DISPOSITION:  # HOLD chemo # follow up in 4 weeks-MD; labs-cbc/cmp/cea;NO chemo; port flush---Dr.B

## 2020-12-22 NOTE — Progress Notes (Signed)
[place] NOTE  Patient Care Team: Laneta Simmers, NP (Inactive) as PCP - General (Nurse Practitioner) Telford Nab, RN as Registered Nurse Clent Jacks, RN as Oncology Nurse Navigator  CHIEF COMPLAINTS/PURPOSE OF CONSULTATION: Lung cancer/colon cancer   Oncology History Overview Note  # July 2020- SMALL CELL CA METASTATIC TO MEDIASTINAL LN [open Bx; Dr.Oaks]; TxN2M0; July 2nd 2020-PET- 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  MRI brain negative  # aug 17th 2020-carbo etoposide-RT [? 8/19]; #4 cycle carbo-Etop [finished dec 30th,2020]  #August 2020 iron deficient anemia-question etiology; IV Feraheme [no colonoscopy]  # OCT 2020- colo/ Cecal adeno ca; MRI liver- 1 cm enhancing lesion "metastasis" [Dr.Anna/Dr.Cannon]-difficulty biopsy.  November 02-2019 right hemicolectomy- STAGE III [pT3pN1 (2/13LN)]- HOLD adjuvant therapy. MMR- INTACT/LOW  #May 2021-liver biopsy-possible adenocarcinoma; colorectal origin.  Stage IV colon cancer  #May 24-2021-FOLFOX with Avastin; AUG 31st,2021- STABLE liver lesion; SEP 1st, 2021- Discontinue LV+Ox sec to severe fatigue/PN; cont 5FU CIV + Avastin  # COPD/  DM-2- on OHA/ smoker/ PVD/peripheral neuropathy  # History of alcohol abuse/quit 2014/ Hx of abdominal trauma [at 21y]  #July 2021-foundation 1 NGS [liver metastases]- RAS WILD TYPE **  # Jan 1st, 2022-Covid infection [prior vaccination; monoclonal infusion-not hospitalized]  # DIAGNOSIS:   # SMALL CELL CA-limited stage-status post chemoradiation;   # colon cancer-stage IV-solitary liver lesion  GOALS: Control  CURRENT/MOST RECENT THERAPY : FOLFOX with Avastin   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 ([place])  01/29/2019 Initial Diagnosis   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 [facility])   02/09/2019 - 06/24/2019 Chemotherapy   The patient had palonosetron (ALOXI)  injection 0.25 mg, 0.25 mg, Intravenous,  Once, 4 of 4 cycles Administration: 0.25 mg (02/09/2019), 0.25 mg (03/03/2019), 0.25 mg (06/02/2019), 0.25 mg (06/22/2019) CARBOplatin (PARAPLATIN) 410 mg in sodium chloride 0.9 % 250 mL chemo infusion, 410 mg (100 % of original dose 414 mg), Intravenous,  Once, 4 of 4 cycles Dose modification:   (original dose 414 mg, Cycle 1) Administration: 410 mg (02/09/2019), 410 mg (03/03/2019), 410 mg (06/02/2019), 410 mg (06/22/2019) etoposide (VEPESID) 180 mg in sodium chloride 0.9 % 500 mL chemo infusion, 100 mg/m2 = 180 mg, Intravenous,  Once, 4 of 4 cycles Administration: 180 mg (02/09/2019), 180 mg (02/10/2019), 180 mg (02/11/2019), 180 mg (03/03/2019), 180 mg (03/04/2019), 180 mg (03/05/2019), 180 mg (06/02/2019), 180 mg (06/03/2019), 180 mg (06/04/2019), 180 mg (06/22/2019), 180 mg (06/23/2019), 180 mg (06/24/2019) fosaprepitant (EMEND) 150 mg, dexamethasone (DECADRON) 6 mg in sodium chloride 0.9 % 145 mL IVPB, , Intravenous,  Once, 2 of 2 cycles Administration:  (06/02/2019),  (06/22/2019)   for chemotherapy treatment.     Cancer of ascending colon ([place])  05/04/2019 Initial Diagnosis   Cancer of ascending colon ([place])    11/30/2019 -  Chemotherapy    Patient is on Treatment Plan: COLORECTAL FOLFOX + BEVACIZUMAB Q14D        HISTORY OF PRESENTING ILLNESS:   Kevin Mckay 70 y.o.  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  Patient recently evaluated in the symptom management clinic noted to have perirectal abscess.  Status post evaluation with surgery followed by I&D on antibiotics.  Notes to have significant improvement of his perirectal pain.  No fevers or chills.  No abdominal pain.  No nausea no vomiting.  Chronic back pain.  Review of Systems  Constitutional:   Positive for malaise/fatigue and weight loss. Negative for chills, diaphoresis and fever.  HENT:  Negative for nosebleeds and sore throat.   Eyes:  Negative for double vision.  Respiratory:  Positive for shortness of breath. Negative for hemoptysis and wheezing.   Cardiovascular:  Negative for chest pain, palpitations and orthopnea.  Gastrointestinal:  Negative for abdominal pain, blood in stool, constipation, diarrhea, heartburn, melena, nausea and vomiting.  Genitourinary:  Negative for dysuria, frequency and urgency.  Musculoskeletal:  Positive for back pain and joint pain.  Skin: Negative.  Negative for itching and rash.  Neurological:  Positive for tingling. Negative for dizziness, focal weakness and weakness.  Endo/Heme/Allergies:  Does not bruise/bleed easily.  Psychiatric/Behavioral:  Negative for depression. The patient is not nervous/anxious and does not have insomnia.     MEDICAL HISTORY:  Past Medical History:  Diagnosis Date   Asthma    Cancer ([place])    Metastatic small cell lung cancer   Cerebral aneurysm    Chronic painful diabetic neuropathy (HCC)    Depression    Diabetes mellitus without complication ([place])    Essential hypertension    History of kidney stones    Occasional tremors    Tobacco use     SURGICAL HISTORY: Past Surgical History:  Procedure Laterality Date   ABDOMINAL SURGERY     age 84. trauma surgery due to forklift injury- liver and spleen   COLONOSCOPY WITH PROPOFOL N/A 03/27/2019   Procedure: COLONOSCOPY WITH PROPOFOL;  Surgeon: Jonathon Bellows, MD;  Location: [facility] ENDOSCOPY;  Service: Gastroenterology;  Laterality: N/A;   COLOSTOMY REVISION Right 05/04/2019   Procedure: COLON RESECTION RIGHT-right hemicolectomy-open;  Surgeon: Fredirick Maudlin, MD;  Location: [facility] ORS;  Service: General;  Laterality: Right;   ESOPHAGOGASTRODUODENOSCOPY (EGD) WITH PROPOFOL N/A 03/27/2019   Procedure: ESOPHAGOGASTRODUODENOSCOPY (EGD) WITH PROPOFOL;  Surgeon: Jonathon Bellows,  MD;  Location: [facility] ENDOSCOPY;  Service: Gastroenterology;  Laterality: N/A;   IR GENERIC HISTORICAL  05/31/2016   IR ANGIO VERTEBRAL SEL VERTEBRAL BILAT MOD SED 05/31/2016 Consuella Lose, MD [facility]-INTER[facility] RAD   IR GENERIC HISTORICAL  05/31/2016   IR ANGIO INTRA EXTRACRAN SEL INTERNAL CAROTID BILAT MOD SED 05/31/2016 Consuella Lose, MD [facility]   PORTACATH PLACEMENT Right 02/04/2019   Procedure: INSERTION PORT-A-CATH;  Surgeon: [name], MD;  Location: [facility] ORS;  Service: General;  Laterality: Right;   THORACOTOMY Left 01/22/2019   Procedure: PRE OP BRONCH LEFT ANTERIOR THORACOTOMY WITH BIOPSY OF MEDIASTINAL MASS;  Surgeon: [name], MD;  Location: [facility] ORS;  Service: General;  Laterality: Left;    SOCIAL HISTORY: Social History   Socioeconomic History   Marital status: Married    Spouse name: Not on file   Number of children: Not on file   Years of education: Not on file   Highest education level: Not on file  Occupational History   Not on file  Tobacco Use   Smoking status: Every Day    Packs/day: 0.50    Years: 50.00    Pack years: 25.00    Types: Cigarettes   Smokeless tobacco: Never  Vaping Use   Vaping Use: Every day   Substances: Nicotine  Substance and Sexual Activity   Alcohol use: No   Drug use: Yes    Types: Barbituates, Marijuana   Sexual activity: Not on file  Other Topics Concern   Not on file  Social History Narrative   Lives  in [place]; wife/ son/grandson [custody]; smoker; vending business; hx of alcoholism- quit at 26.    Social Determinants of Health   Financial Resource Strain: Not on file  Food Insecurity: Not on file  Transportation Needs: Not on file  Physical Activity: Not on file  Stress: Not on file  Social Connections: Not on file  Intimate Partner Violence: Not on file    FAMILY HISTORY: Family History  Problem Relation Age of Onset   Lymphoma Father    Liver cancer Paternal Uncle    Lung cancer Paternal Uncle     Lung cancer Maternal Uncle     ALLERGIES:  is allergic to ferumoxytol, amoxicillin, and codeine.  MEDICATIONS:  Current Outpatient Medications  Medication Sig Dispense Refill   amLODipine (NORVASC) 10 MG tablet Take 10 mg by mouth every morning.      budesonide-formoterol (SYMBICORT) 160-4.5 MCG/ACT inhaler Inhale 2 puffs into the lungs 2 (two) times daily.     clindamycin (CLEOCIN) 300 MG capsule Take 1 capsule (300 mg total) by mouth 4 (four) times daily. 28 capsule 0   DULoxetine (CYMBALTA) 30 MG capsule Take 30 mg by mouth every morning.      gabapentin (NEURONTIN) 800 MG tablet Take 800 mg by mouth 3 (three) times daily.     HYDROcodone-acetaminophen (NORCO/VICODIN) 5-325 MG tablet Take 1 tablet by mouth every 6 (six) hours as needed for moderate pain. 45 tablet 0   insulin glargine (LANTUS) 100 UNIT/ML injection Inject into the skin daily. Units based By sliding scale     Ipratropium-Albuterol (COMBIVENT) 20-100 MCG/ACT AERS respimat Inhale into the lungs.     lidocaine-prilocaine (EMLA) cream Apply 1 application topically as needed. 30-45 mins prior to port access. 30 g 0   lisinopril-hydrochlorothiazide (ZESTORETIC) 10-12.5 MG tablet Take 1 tablet by mouth daily.     lovastatin (MEVACOR) 20 MG tablet Take 20 mg by mouth every evening.     metFORMIN (GLUCOPHAGE) 1000 MG tablet Take 1,000 mg by mouth 2 (two) times daily with a meal.      omeprazole (PRILOSEC) 40 MG capsule Take 1 capsule (40 mg total) by mouth daily. 90 capsule 1   ondansetron (ZOFRAN) 8 MG tablet Take 1 tablet (8 mg total) by mouth every 8 (eight) hours as needed for nausea or vomiting (start 3 days; after chemo). 40 tablet 1   polyethylene glycol powder (MIRALAX) 17 GM/SCOOP powder Mix full container in 64 ounces of Gatorade or other clear liquid. NO RED Liquids 238 g 0   prazosin (MINIPRESS) 1 MG capsule Take 1 mg by mouth at bedtime.     pyridOXINE (VITAMIN B-6) 100 MG tablet Take 100 mg by mouth daily.      sitaGLIPtin (JANUVIA) 25 MG tablet Take 25 mg by mouth daily.     sodium chloride 1 g tablet Take 1 tablet (1 g total) by mouth 3 (three) times daily. 30 tablet 0   vitamin B-12 (CYANOCOBALAMIN) 100 MCG tablet Take 100 mcg by mouth daily.     linaclotide (LINZESS) 145 MCG CAPS capsule Take 145 mcg by mouth daily before breakfast. (Patient not taking: Reported on 12/22/2020)     No current facility-administered medications for this visit.   Facility-Administered Medications Ordered in Other Visits  Medication Dose Route Frequency Provider Last Rate Last Admin   sodium chloride flush (NS) 0.9 % injection 10 mL  10 mL Intravenous PRN Cammie Sickle, MD   10 mL at 09/13/20 0943      .  PHYSICAL EXAMINATION: ECOG PERFORMANCE STATUS: 0 - Asymptomatic  Vitals:   12/22/20 1331  BP: (!) 153/71  Pulse: 84  Resp: 20  Temp: 99 F (37.2 C)   Filed Weights   12/22/20 1331  Weight: 139 lb 6.4 oz (63.2 kg)    Physical Exam HENT:     Head: Normocephalic and atraumatic.     Mouth/Throat:     Pharynx: No oropharyngeal exudate.  Eyes:     Pupils: Pupils are equal, round, and reactive to light.  Cardiovascular:     Rate and Rhythm: Normal rate and regular rhythm.  Pulmonary:     Effort: No respiratory distress.     Breath sounds: No wheezing.  Abdominal:     General: Bowel sounds are normal. There is no distension.     Palpations: Abdomen is soft. There is no mass.     Tenderness: no abdominal tenderness There is no guarding or rebound.     Comments: Abdominal incision well-healed.  Musculoskeletal:        General: No tenderness. Normal range of motion.     Cervical back: Normal range of motion and neck supple.  Skin:    General: Skin is warm.  Neurological:     Mental Status: He is alert and oriented to person, place, and time.  Psychiatric:        Mood and Affect: Affect normal.    LABORATORY DATA:  I have reviewed the data as listed Lab Results  Component Value Date    WBC 6.7 12/22/2020   HGB 11.5 (L) 12/22/2020   HCT 33.9 (L) 12/22/2020   MCV 82.3 12/22/2020   PLT 263 12/22/2020   Recent Labs    02/24/20 0819 03/09/20 0813 03/23/20 0850 04/06/20 0813 11/16/20 0938 12/14/20 1316 12/22/20 1307  NA 134* 132* 136   < > 129* 125* 127*  K 4.1 3.9 3.6   < > 4.5 4.2 5.0  CL 98 97* 100   < > 96* 91* 94*  CO2 '26 25 25   ' < > '23 22 24  ' GLUCOSE 196* 260* 170*   < > 221* 286* 294*  BUN 33* 16 17   < > '16 15 14  ' CREATININE 0.96 0.96 0.97   < > 1.25* 1.00 1.11  CALCIUM 8.6* 8.6* 9.1   < > 8.8* 8.7* 9.1  GFRNONAA >60 >60 >60   < > >60 >60 >60  GFRAA >60 >60 >60  --   --   --   --   PROT 6.8 6.8 6.9   < > 6.8 7.2 7.0  ALBUMIN 3.6 3.6 3.8   < > 3.7 3.5 3.6  AST '20 26 20   ' < > '20 17 22  ' ALT '17 19 18   ' < > '15 14 19  ' ALKPHOS 59 64 59   < > 56 68 59  BILITOT 0.3 0.6 0.6   < > 0.6 0.7 0.4   < > = values in this interval not displayed.    RADIOGRAPHIC STUDIES: I have personally reviewed the radiological images as listed and agreed with the findings in the report. CT Abdomen Pelvis Wo Contrast  Result Date: 12/07/2020 CLINICAL DATA:  Gastrointestinal cancer, assess treatment response. Right hemicolectomy done in November 2020 for colon cancer. History of small cell lung cancer. EXAM: CT CHEST, ABDOMEN AND PELVIS WITHOUT CONTRAST TECHNIQUE: Multidetector CT imaging of the chest, abdomen and pelvis was performed following the standard protocol without IV contrast. COMPARISON:  Prior CTs  09/08/2020 and 06/07/2020. FINDINGS: CT CHEST FINDINGS Cardiovascular: Right subclavian central venous catheter projects to the superior cavoatrial junction. There is diffuse atherosclerosis of the aorta, great vessels and coronary arteries. The heart size is normal. There is no pericardial effusion. Mediastinum/Nodes: There are no enlarged mediastinal, hilar or axillary lymph nodes. Hilar assessment is limited by the lack of intravenous contrast, although the hilar contours appear  unchanged. The thyroid gland, trachea and esophagus demonstrate no significant findings. Lungs/Pleura: There is no pleural effusion. Stable post treatment changes in the left perihilar region. Underlying mild centrilobular emphysema and scattered centrilobular ground-glass pulmonary nodules. No enlarging solid pulmonary nodules. Musculoskeletal/Chest wall: No chest wall mass or suspicious osseous findings. CT ABDOMEN AND PELVIS FINDINGS Hepatobiliary: The previously demonstrated subcapsular lesion laterally in the dome of the right hepatic lobe (image 52/2, biopsy-proven metastasis) is not well seen on this noncontrast study. No new or enlarging lesions are identified. No evidence of gallstones, gallbladder wall thickening or biliary dilatation. Pancreas: The pancreatic head appears stable. Chronic atrophy or postsurgical absence of the pancreatic body and tail, unchanged. Spleen: Normal in size without focal abnormality. Adrenals/Urinary Tract: Both adrenal glands appear normal. The kidneys appear unchanged. There are renovascular calcifications bilaterally. No evidence of urinary tract calculus or hydronephrosis. The bladder appears unremarkable. Stomach/Bowel: Enteric contrast was administered and has passed into the distal small bowel. The stomach appears unremarkable for its degree of distension. No evidence of bowel wall thickening, distention or surrounding inflammatory change. Stable postsurgical changes from previous right hemicolectomy. Gas and fluid are present throughout the colon and there are scattered diverticular changes distally. Vascular/Lymphatic: There are no enlarged abdominal or pelvic lymph nodes. Diffuse aortic and branch vessel atherosclerosis. Numerous venous collaterals are noted in the left upper quadrant. Reproductive: The prostate gland and seminal vesicles appear unremarkable. Other: Postsurgical changes in the anterior abdominal wall. No ascites or peritoneal nodularity.  Musculoskeletal: No acute or significant osseous findings. Degenerative disc disease at L5-S1 with chronic biforaminal narrowing. IMPRESSION: 1. No evidence of progressive metastatic disease in the chest, abdomen or pelvis on noncontrast imaging. The known lesion in the dome of the right hepatic lobe is not well visualized. 2. Stable treatment changes in the left perihilar region without signs of local recurrence. 3. Stable postsurgical changes as described. Distal colonic diverticulosis without acute inflammation. 4. Aortic Atherosclerosis (ICD10-I70.0) and Emphysema (ICD10-J43.9). Electronically Signed   By: Richardean Sale M.D.   On: 12/07/2020 21:37   CT Chest Wo Contrast  Result Date: 12/07/2020 CLINICAL DATA:  Gastrointestinal cancer, assess treatment response. Right hemicolectomy done in November 2020 for colon cancer. History of small cell lung cancer. EXAM: CT CHEST, ABDOMEN AND PELVIS WITHOUT CONTRAST TECHNIQUE: Multidetector CT imaging of the chest, abdomen and pelvis was performed following the standard protocol without IV contrast. COMPARISON:  Prior CTs 09/08/2020 and 06/07/2020. FINDINGS: CT CHEST FINDINGS Cardiovascular: Right subclavian central venous catheter projects to the superior cavoatrial junction. There is diffuse atherosclerosis of the aorta, great vessels and coronary arteries. The heart size is normal. There is no pericardial effusion. Mediastinum/Nodes: There are no enlarged mediastinal, hilar or axillary lymph nodes. Hilar assessment is limited by the lack of intravenous contrast, although the hilar contours appear unchanged. The thyroid gland, trachea and esophagus demonstrate no significant findings. Lungs/Pleura: There is no pleural effusion. Stable post treatment changes in the left perihilar region. Underlying mild centrilobular emphysema and scattered centrilobular ground-glass pulmonary nodules. No enlarging solid pulmonary nodules. Musculoskeletal/Chest wall: No chest wall  mass or  suspicious osseous findings. CT ABDOMEN AND PELVIS FINDINGS Hepatobiliary: The previously demonstrated subcapsular lesion laterally in the dome of the right hepatic lobe (image 52/2, biopsy-proven metastasis) is not well seen on this noncontrast study. No new or enlarging lesions are identified. No evidence of gallstones, gallbladder wall thickening or biliary dilatation. Pancreas: The pancreatic head appears stable. Chronic atrophy or postsurgical absence of the pancreatic body and tail, unchanged. Spleen: Normal in size without focal abnormality. Adrenals/Urinary Tract: Both adrenal glands appear normal. The kidneys appear unchanged. There are renovascular calcifications bilaterally. No evidence of urinary tract calculus or hydronephrosis. The bladder appears unremarkable. Stomach/Bowel: Enteric contrast was administered and has passed into the distal small bowel. The stomach appears unremarkable for its degree of distension. No evidence of bowel wall thickening, distention or surrounding inflammatory change. Stable postsurgical changes from previous right hemicolectomy. Gas and fluid are present throughout the colon and there are scattered diverticular changes distally. Vascular/Lymphatic: There are no enlarged abdominal or pelvic lymph nodes. Diffuse aortic and branch vessel atherosclerosis. Numerous venous collaterals are noted in the left upper quadrant. Reproductive: The prostate gland and seminal vesicles appear unremarkable. Other: Postsurgical changes in the anterior abdominal wall. No ascites or peritoneal nodularity. Musculoskeletal: No acute or significant osseous findings. Degenerative disc disease at L5-S1 with chronic biforaminal narrowing. IMPRESSION: 1. No evidence of progressive metastatic disease in the chest, abdomen or pelvis on noncontrast imaging. The known lesion in the dome of the right hepatic lobe is not well visualized. 2. Stable treatment changes in the left perihilar region  without signs of local recurrence. 3. Stable postsurgical changes as described. Distal colonic diverticulosis without acute inflammation. 4. Aortic Atherosclerosis (ICD10-I70.0) and Emphysema (ICD10-J43.9). Electronically Signed   By: Richardean Sale M.D.   On: 12/07/2020 21:37    ASSESSMENT & PLAN:   Cancer of ascending colon ([place]) #Right colon cancer stage IV- liver metastases; adenocarcinoma; currently 5FU + avastin on hold-see below; June 2022 CT scan chest and pelvis/noncontrast-  No evidence of progressive metastatic disease in the chest,abdomen or pelvis on noncontrast imaging. The known lesion in thedome of the right hepatic lobe is not well visualized.  Left perihilar treatment changes no evidence of local recurrence.    # CONTINUE to hold off chemo today 5FU with Avastin [given over all poor tolerance/Bil LE pain]; especially perianal abscess s/p drainage.  # LEFT hilar/LUL-  Limited stage small cell cancer-s/p chemo-RT;june  2022-CT chest-STABLE   #Perianal/rectal abscess-s/p drainage followed by surgery [predisposed by poorly controlled diabetes]; Dr. Celine Ahr; on antibiotics.  Improving  # Bil LE pain esp on exertion-clinically suspect PVD; Awaiting appt. With Dr.dew.   # Anemia- secondary- sec to chemo-hemoglobin on 11-12-stable  #Hyponatremia sodium 127-over all STABLE-  ? Etiology; limit 1.5 lit free water; increase salt intake-  # Back pain- MRI lumbar spine- July 2021-NEG for mets; severe arthritic changes; spinal canal stenosis-stable hydrocodone 1 day prn.  Stable  # Poorly controlled diabetes blood sugars-better controlled as per patients-overall stable [id number] STABLE.   # HTN- 140s/ 70-on Avastin-stable continue current anti-HTNs.   # DISPOSITION:  # HOLD chemo # follow up in 4 weeks-MD; labs-cbc/cmp/cea;NO chemo; port flush---Dr.B   All questions were answered. The patient knows to call the clinic with any problems, questions or concerns.    Cammie Sickle,  MD 12/26/2020 4:29 PM

## 2020-12-23 LAB — CEA: CEA: 2.9 ng/mL (ref 0.0–4.7)

## 2020-12-26 ENCOUNTER — Encounter: Payer: Self-pay | Admitting: Internal Medicine

## 2020-12-29 ENCOUNTER — Other Ambulatory Visit: Payer: Self-pay

## 2020-12-29 ENCOUNTER — Ambulatory Visit
Admission: RE | Admit: 2020-12-29 | Discharge: 2020-12-29 | Disposition: A | Discharge status: Home / Self Care | Payer: BC Managed Care – PPO | Source: Ambulatory Visit | Attending: Physician Assistant | Admitting: Physician Assistant

## 2020-12-29 DIAGNOSIS — M79604 Pain in right leg: Secondary | ICD-10-CM | POA: Insufficient documentation

## 2020-12-29 DIAGNOSIS — M79605 Pain in left leg: Secondary | ICD-10-CM | POA: Diagnosis present

## 2021-01-05 ENCOUNTER — Other Ambulatory Visit: Payer: Self-pay

## 2021-01-05 ENCOUNTER — Encounter: Payer: Self-pay | Admitting: Physician Assistant

## 2021-01-05 ENCOUNTER — Ambulatory Visit (INDEPENDENT_AMBULATORY_CARE_PROVIDER_SITE_OTHER): Payer: BC Managed Care – PPO | Admitting: Physician Assistant

## 2021-01-05 VITALS — BP 137/73 | HR 72 | Temp 97.7°F | Ht 68.0 in | Wt 143.2 lb

## 2021-01-05 DIAGNOSIS — L0231 Cutaneous abscess of buttock: Secondary | ICD-10-CM

## 2021-01-05 DIAGNOSIS — Z09 Encounter for follow-up examination after completed treatment for conditions other than malignant neoplasm: Secondary | ICD-10-CM

## 2021-01-05 NOTE — Patient Instructions (Signed)
Please call if you have any questions or concerns.  °

## 2021-01-05 NOTE — Progress Notes (Signed)
[facility] POST-OP OFFICE VISIT  01/05/2021  HPI: Kevin Mckay is a 70 y.o. 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   Vital signs: BP 137/73   Pulse 72   Temp 97.7 F (36.5 C)   Ht 5\' 8"  (1.727 m)   Wt 143 lb 3.2 oz (65 kg)   SpO2 97%   BMI 21.77 kg/m    Physical Exam: Constitutional: Well appearing male, NAD Skin: I&D site to the left gluteal crease lateral to the anus is almost completely healed, <5 mm, mild induration, no erythema, no drainage  Assessment/Plan: This is a 70 y.o. male  ~3 weeks s/p incision and drainage of left gluteal abscess   - Reviewed wound care; he can transition to superficial dressings as needed  - He can follow up with surgery clinic as needed   -- Edison Simon, PA-C [facility] 01/05/2021, 11:18 AM [phone] M-F: 7am - 4pm

## 2021-01-19 ENCOUNTER — Inpatient Hospital Stay: Payer: BC Managed Care – PPO | Admitting: Internal Medicine

## 2021-01-19 ENCOUNTER — Inpatient Hospital Stay: Payer: BC Managed Care – PPO | Attending: Internal Medicine

## 2021-01-19 ENCOUNTER — Encounter: Payer: Self-pay | Admitting: Internal Medicine

## 2021-01-19 DIAGNOSIS — Z95828 Presence of other vascular implants and grafts: Secondary | ICD-10-CM

## 2021-01-19 DIAGNOSIS — C182 Malignant neoplasm of ascending colon: Secondary | ICD-10-CM | POA: Insufficient documentation

## 2021-01-19 DIAGNOSIS — C781 Secondary malignant neoplasm of mediastinum: Secondary | ICD-10-CM

## 2021-01-19 DIAGNOSIS — C801 Malignant (primary) neoplasm, unspecified: Secondary | ICD-10-CM

## 2021-01-19 LAB — CBC WITH DIFFERENTIAL/PLATELET
Abs Immature Granulocytes: 0.03 10*3/uL (ref 0.00–0.07)
Basophils Absolute: 0 10*3/uL (ref 0.0–0.1)
Basophils Relative: 0 %
Eosinophils Absolute: 0.3 10*3/uL (ref 0.0–0.5)
Eosinophils Relative: 5 %
HCT: 31.8 % — ABNORMAL LOW (ref 39.0–52.0)
Hemoglobin: 10.7 g/dL — ABNORMAL LOW (ref 13.0–17.0)
Immature Granulocytes: 0 %
Lymphocytes Relative: 23 %
Lymphs Abs: 1.5 10*3/uL (ref 0.7–4.0)
MCH: 28.2 pg (ref 26.0–34.0)
MCHC: 33.6 g/dL (ref 30.0–36.0)
MCV: 83.7 fL (ref 80.0–100.0)
Monocytes Absolute: 0.5 10*3/uL (ref 0.1–1.0)
Monocytes Relative: 8 %
Neutro Abs: 4.3 10*3/uL (ref 1.7–7.7)
Neutrophils Relative %: 64 %
Platelets: 137 10*3/uL — ABNORMAL LOW (ref 150–400)
RBC: 3.8 MIL/uL — ABNORMAL LOW (ref 4.22–5.81)
RDW: 15.9 % — ABNORMAL HIGH (ref 11.5–15.5)
WBC: 6.7 10*3/uL (ref 4.0–10.5)
nRBC: 0 % (ref 0.0–0.2)

## 2021-01-19 LAB — COMPREHENSIVE METABOLIC PANEL
ALT: 14 U/L (ref 0–44)
AST: 18 U/L (ref 15–41)
Albumin: 3.6 g/dL (ref 3.5–5.0)
Alkaline Phosphatase: 46 U/L (ref 38–126)
Anion gap: 9 (ref 5–15)
BUN: 19 mg/dL (ref 8–23)
CO2: 25 mmol/L (ref 22–32)
Calcium: 9.1 mg/dL (ref 8.9–10.3)
Chloride: 96 mmol/L — ABNORMAL LOW (ref 98–111)
Creatinine, Ser: 0.94 mg/dL (ref 0.61–1.24)
GFR, Estimated: >60 mL/min (ref >60)
Glucose, Bld: 148 mg/dL — ABNORMAL HIGH (ref 70–99)
Potassium: 4.6 mmol/L (ref 3.5–5.1)
Sodium: 130 mmol/L — ABNORMAL LOW (ref 135–145)
Total Bilirubin: 0.6 mg/dL (ref 0.3–1.2)
Total Protein: 6.5 g/dL (ref 6.5–8.1)

## 2021-01-19 MED ORDER — HEPARIN SOD (PORK) LOCK FLUSH 100 UNIT/ML IV SOLN
INTRAVENOUS | Status: AC
  Filled 2021-01-19: qty 5

## 2021-01-19 MED ORDER — HEPARIN SOD (PORK) LOCK FLUSH 100 UNIT/ML IV SOLN
500.0000 [IU] | Freq: Once | INTRAVENOUS | Status: AC
  Administered 2021-01-19: 500 [IU] via INTRAVENOUS
  Filled 2021-01-19: qty 5

## 2021-01-19 MED ORDER — SODIUM CHLORIDE 0.9% FLUSH
10.0000 mL | Freq: Once | INTRAVENOUS | Status: AC
  Administered 2021-01-19: 10 mL via INTRAVENOUS
  Filled 2021-01-19: qty 10

## 2021-01-19 NOTE — Assessment & Plan Note (Addendum)
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 The known lesion in thedome of the right hepatic lobe is not well visualized.  Left perihilar treatment changes no evidence of local recurrence.  Stable.  #Continue to monitor for now/continue chemo holiday.  # LEFT hilar/LUL-  Limited stage small cell cancer-s/p chemo-RT;june  2022-CT chest-stable.  #Perianal/rectal abscess-s/p drainage  s/p  antibiotics- almost resolved-stable  # Bil LE pain esp on exertion-clinically suspect PVD; Awaiting on aug 3rd. With Dr.dew.   # Anemia- secondary- sec to chemo-hemoglobin on 11-12-stable  #Hyponatremia sodium 127-over all STABLE-  ? Etiology; limit 1.5 lit free water stable  # Back pain- MRI lumbar spine- July 2021-NEG for mets; severe arthritic changes; spinal canal stenosis-stable hydrocodone 1 day prn stable  # Poorly controlled diabetes blood sugars-better controlled as per patients-overall stable [id number] dsicussed re:   # HTN- 120s/ 70-OFF[[ Avastin]-stable continue current anti-HTNs.   # DISPOSITION:  # follow up in 4 weeks-MD; labs-cbc/cmp/cea;NO chemo; port flush---Dr.B

## 2021-01-19 NOTE — Progress Notes (Signed)
[place] NOTE  Patient Care Team: Laneta Simmers, NP (Inactive) as PCP - General (Nurse Practitioner) Telford Nab, RN as Registered Nurse Clent Jacks, RN as Oncology Nurse Navigator  CHIEF COMPLAINTS/PURPOSE OF CONSULTATION: Lung cancer/colon cancer   Oncology History Overview Note  # July 2020- SMALL CELL CA METASTATIC TO MEDIASTINAL LN [open Bx; Dr.Oaks]; TxN2M0; July 2nd 2020-PET- 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  MRI brain negative  # aug 17th 2020-carbo etoposide-RT [? 8/19]; #4 cycle carbo-Etop [finished dec 30th,2020]  #August 2020 iron deficient anemia-question etiology; IV Feraheme [no colonoscopy]  # OCT 2020- colo/ Cecal adeno ca; MRI liver- 1 cm enhancing lesion "metastasis" [Dr.Anna/Dr.Cannon]-difficulty biopsy.  November 02-2019 right hemicolectomy- STAGE III [pT3pN1 (2/13LN)]- HOLD adjuvant therapy. MMR- INTACT/LOW  #May 2021-liver biopsy-possible adenocarcinoma; colorectal origin.  Stage IV colon cancer  #May 24-2021-FOLFOX with Avastin; AUG 31st,2021- STABLE liver lesion; SEP 1st, 2021- Discontinue LV+Ox sec to severe fatigue/PN; cont 5FU CIV + Avastin  # COPD/  DM-2- on OHA/ smoker/ PVD/peripheral neuropathy  # History of alcohol abuse/quit 2014/ Hx of abdominal trauma [at 21y]  #July 2021-foundation 1 NGS [liver metastases]- RAS WILD TYPE **  # Jan 1st, 2022-Covid infection [prior vaccination; monoclonal infusion-not hospitalized]  # DIAGNOSIS:   # SMALL CELL CA-limited stage-status post chemoradiation;   # colon cancer-stage IV-solitary liver lesion  GOALS: Control  CURRENT/MOST RECENT THERAPY : FOLFOX with Avastin   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 ([place])  01/29/2019 Initial Diagnosis   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 [facility])   02/09/2019 - 06/24/2019 Chemotherapy   The patient had palonosetron (ALOXI)  injection 0.25 mg, 0.25 mg, Intravenous,  Once, 4 of 4 cycles Administration: 0.25 mg (02/09/2019), 0.25 mg (03/03/2019), 0.25 mg (06/02/2019), 0.25 mg (06/22/2019) CARBOplatin (PARAPLATIN) 410 mg in sodium chloride 0.9 % 250 mL chemo infusion, 410 mg (100 % of original dose 414 mg), Intravenous,  Once, 4 of 4 cycles Dose modification:   (original dose 414 mg, Cycle 1) Administration: 410 mg (02/09/2019), 410 mg (03/03/2019), 410 mg (06/02/2019), 410 mg (06/22/2019) etoposide (VEPESID) 180 mg in sodium chloride 0.9 % 500 mL chemo infusion, 100 mg/m2 = 180 mg, Intravenous,  Once, 4 of 4 cycles Administration: 180 mg (02/09/2019), 180 mg (02/10/2019), 180 mg (02/11/2019), 180 mg (03/03/2019), 180 mg (03/04/2019), 180 mg (03/05/2019), 180 mg (06/02/2019), 180 mg (06/03/2019), 180 mg (06/04/2019), 180 mg (06/22/2019), 180 mg (06/23/2019), 180 mg (06/24/2019) fosaprepitant (EMEND) 150 mg, dexamethasone (DECADRON) 6 mg in sodium chloride 0.9 % 145 mL IVPB, , Intravenous,  Once, 2 of 2 cycles Administration:  (06/02/2019),  (06/22/2019)   for chemotherapy treatment.     Cancer of ascending colon ([place])  05/04/2019 Initial Diagnosis   Cancer of ascending colon ([place])   11/30/2019 -  Chemotherapy    Patient is on Treatment Plan: COLORECTAL FOLFOX + BEVACIZUMAB Q14D        HISTORY OF PRESENTING ILLNESS:   Kevin Mckay 70 y.o.  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   Patient denies any nausea vomiting abdominal pain.  Patient's perirectal abscess is completely resolved.  Patient is currently off antibiotics.  No nausea no vomiting.  No fevers or chills.  Chronic bilateral lower extremity pain awaiting evaluation with vascular.  Review of Systems  Constitutional:  Positive  for malaise/fatigue and weight loss. Negative for chills, diaphoresis and fever.   HENT:  Negative for nosebleeds and sore throat.   Eyes:  Negative for double vision.  Respiratory:  Negative for hemoptysis and wheezing.   Cardiovascular:  Negative for chest pain, palpitations and orthopnea.  Gastrointestinal:  Negative for abdominal pain, blood in stool, constipation, diarrhea, heartburn, melena, nausea and vomiting.  Genitourinary:  Negative for dysuria, frequency and urgency.  Musculoskeletal:  Positive for back pain and joint pain.  Skin: Negative.  Negative for itching and rash.  Neurological:  Positive for tingling. Negative for dizziness, focal weakness and weakness.  Endo/Heme/Allergies:  Does not bruise/bleed easily.  Psychiatric/Behavioral:  Negative for depression. The patient is not nervous/anxious and does not have insomnia.     MEDICAL HISTORY:  Past Medical History:  Diagnosis Date  . Asthma   . Cancer ([place])    Metastatic small cell lung cancer  . Cerebral aneurysm   . Chronic painful diabetic neuropathy ([place])   . Depression   . Diabetes mellitus without complication ([place])   . Essential hypertension   . History of kidney stones   . Occasional tremors   . Tobacco use     SURGICAL HISTORY: Past Surgical History:  Procedure Laterality Date  . ABDOMINAL SURGERY     age 40. trauma surgery due to forklift injury- liver and spleen  . COLONOSCOPY WITH PROPOFOL N/A 03/27/2019   Procedure: COLONOSCOPY WITH PROPOFOL;  Surgeon: Jonathon Bellows, MD;  Location: [facility] ENDOSCOPY;  Service: Gastroenterology;  Laterality: N/A;  . COLOSTOMY REVISION Right 05/04/2019   Procedure: COLON RESECTION RIGHT-right hemicolectomy-open;  Surgeon: Fredirick Maudlin, MD;  Location: [facility] ORS;  Service: General;  Laterality: Right;  . ESOPHAGOGASTRODUODENOSCOPY (EGD) WITH PROPOFOL N/A 03/27/2019   Procedure: ESOPHAGOGASTRODUODENOSCOPY (EGD) WITH PROPOFOL;  Surgeon: Jonathon Bellows, MD;  Location[facility] ENDOSCOPY;  Service: Gastroenterology;  Laterality: N/A;  . IR GENERIC HISTORICAL   05/31/2016   IR ANGIO VERTEBRAL SEL VERTEBRAL BILAT MOD SED 05/31/2016 Consuella Lose, MD [facility]-INTERV RAD  . IR GENERIC HISTORICAL  05/31/2016   IR ANGIO INTRA EXTRACRAN SEL INTERNAL CAROTID BILAT MOD SED 05/31/2016 Consuella Lose, MD [facility]INTER[facility]  . PORTACATH PLACEMENT Right 02/04/2019   Procedure: INSERTION PORT-A-CATH;  Surgeon: [name], MD;  Location: [facility] ORS;  Service: General;  Laterality: Right;  . THORACOTOMY Left 01/22/2019   Procedure: PRE OP BRONCH LEFT ANTERIOR THORACOTOMY WITH BIOPSY OF MEDIASTINAL MASS;  Surgeon: [name], MD;  Location: [facility] ORS;  Service: General;  Laterality: Left;    SOCIAL HISTORY: Social History   Socioeconomic History  . Marital status: Married    Spouse name: Not on file  . Number of children: Not on file  . Years of education: Not on file  . Highest education level: Not on file  Occupational History  . Not on file  Tobacco Use  . Smoking status: Every Day    Packs/day: 0.50    Years: 50.00    Pack years: 25.00    Types: Cigarettes  . Smokeless tobacco: Never  Vaping Use  . Vaping Use: Every day  . Substances: Nicotine  Substance and Sexual Activity  . Alcohol use: No  . Drug use: Yes    Types: Barbituates, Marijuana  . Sexual activity: Not on file  Other Topics Concern  . Not on file  Social History Narrative   Lives in [place]; wife/ son/grandson [custody]; smoker; vending business; hx of alcoholism- quit at 43.    Social Determinants of Health  Financial Resource Strain: Not on file  Food Insecurity: Not on file  Transportation Needs: Not on file  Physical Activity: Not on file  Stress: Not on file  Social Connections: Not on file  Intimate Partner Violence: Not on file    FAMILY HISTORY: Family History  Problem Relation Age of Onset  . Lymphoma Father   . Liver cancer Paternal Uncle   . Lung cancer Paternal Uncle   . Lung cancer Maternal Uncle     ALLERGIES:  is allergic to ferumoxytol, amoxicillin,  and codeine.  MEDICATIONS:  Current Outpatient Medications  Medication Sig Dispense Refill  . amLODipine (NORVASC) 10 MG tablet Take 10 mg by mouth every morning.     . budesonide-formoterol (SYMBICORT) 160-4.5 MCG/ACT inhaler Inhale 2 puffs into the lungs 2 (two) times daily.    . DULoxetine (CYMBALTA) 30 MG capsule Take 30 mg by mouth every morning.     . gabapentin (NEURONTIN) 800 MG tablet Take 800 mg by mouth 3 (three) times daily.    Marland Kitchen HYDROcodone-acetaminophen (NORCO/VICODIN) 5-325 MG tablet Take 1 tablet by mouth every 6 (six) hours as needed for moderate pain. 45 tablet 0  . insulin glargine (LANTUS) 100 UNIT/ML injection Inject into the skin daily. Units based By sliding scale    . Ipratropium-Albuterol (COMBIVENT) 20-100 MCG/ACT AERS respimat Inhale into the lungs.    . lidocaine-prilocaine (EMLA) cream Apply 1 application topically as needed. 30-45 mins prior to port access. 30 g 0  . lisinopril-hydrochlorothiazide (ZESTORETIC) 10-12.5 MG tablet Take 1 tablet by mouth daily.    Marland Kitchen lovastatin (MEVACOR) 20 MG tablet Take 20 mg by mouth every evening.    . metFORMIN (GLUCOPHAGE) 1000 MG tablet Take 1,000 mg by mouth 2 (two) times daily with a meal.     . omeprazole (PRILOSEC) 40 MG capsule Take 1 capsule (40 mg total) by mouth daily. 90 capsule 1  . polyethylene glycol powder (MIRALAX) 17 GM/SCOOP powder Mix full container in 64 ounces of Gatorade or other clear liquid. NO RED Liquids 238 g 0  . prazosin (MINIPRESS) 1 MG capsule Take 1 mg by mouth at bedtime.    . pyridOXINE (VITAMIN B-6) 100 MG tablet Take 100 mg by mouth daily.    . sitaGLIPtin (JANUVIA) 25 MG tablet Take 25 mg by mouth daily.    . clopidogrel (PLAVIX) 75 MG tablet Take 1 tablet (75 mg total) by mouth daily. 30 tablet 6  . desonide (DESOWEN) 0.05 % cream Apply topically.    . metFORMIN (GLUCOPHAGE-XR) 500 MG 24 hr tablet Take 1,000 mg by mouth 2 (two) times daily.    . ondansetron (ZOFRAN) 8 MG tablet Take 1 tablet  (8 mg total) by mouth every 8 (eight) hours as needed for nausea or vomiting (start 3 days; after chemo). 40 tablet 1   No current facility-administered medications for this visit.   Facility-Administered Medications Ordered in Other Visits  Medication Dose Route Frequency Provider Last Rate Last Admin  . sodium chloride flush (NS) 0.9 % injection 10 mL  10 mL Intravenous PRN Cammie Sickle, MD   10 mL at 09/13/20 0943      .  PHYSICAL EXAMINATION: ECOG PERFORMANCE STATUS: 0 - Asymptomatic  Vitals:   01/19/21 1010  BP: 119/60  Pulse: 70  Resp: 17  Temp: (!) 96.7 F (35.9 C)  SpO2: 99%   Filed Weights   01/19/21 1010  Weight: 146 lb 9.6 oz (66.5 kg)    Physical Exam HENT:  Head: Normocephalic and atraumatic.     Mouth/Throat:     Pharynx: No oropharyngeal exudate.  Eyes:     Pupils: Pupils are equal, round, and reactive to light.  Cardiovascular:     Rate and Rhythm: Normal rate and regular rhythm.  Pulmonary:     Effort: No respiratory distress.     Breath sounds: No wheezing.  Abdominal:     General: Bowel sounds are normal. There is no distension.     Palpations: Abdomen is soft. There is no mass.     Tenderness: There is no abdominal tenderness. There is no guarding or rebound.     Comments: Abdominal incision well-healed.  Musculoskeletal:        General: No tenderness. Normal range of motion.     Cervical back: Normal range of motion and neck supple.  Skin:    General: Skin is warm.  Neurological:     Mental Status: He is alert and oriented to person, place, and time.  Psychiatric:        Mood and Affect: Affect normal.    LABORATORY DATA:  I have reviewed the data as listed Lab Results  Component Value Date   WBC 6.7 01/19/2021   HGB 10.7 (L) 01/19/2021   HCT 31.8 (L) 01/19/2021   MCV 83.7 01/19/2021   PLT 137 (L) 01/19/2021   Recent Labs    02/24/20 0819 03/09/20 0813 03/23/20 0850 04/06/20 0813 12/14/20 1316 12/22/20 1307  01/19/21 0953  NA 134* 132* 136   < > 125* 127* 130*  K 4.1 3.9 3.6   < > 4.2 5.0 4.6  CL 98 97* 100   < > 91* 94* 96*  CO2 '26 25 25   ' < > '22 24 25  ' GLUCOSE 196* 260* 170*   < > 286* 294* 148*  BUN 33* 16 17   < > '15 14 19  ' CREATININE 0.96 0.96 0.97   < > 1.00 1.11 0.94  CALCIUM 8.6* 8.6* 9.1   < > 8.7* 9.1 9.1  GFRNONAA >60 >60 >60   < > >60 >60 >60  GFRAA >60 >60 >60  --   --   --   --   PROT 6.8 6.8 6.9   < > 7.2 7.0 6.5  ALBUMIN 3.6 3.6 3.8   < > 3.5 3.6 3.6  AST '20 26 20   ' < > '17 22 18  ' ALT '17 19 18   ' < > '14 19 14  ' ALKPHOS 59 64 59   < > 68 59 46  BILITOT 0.3 0.6 0.6   < > 0.7 0.4 0.6   < > = values in this interval not displayed.    RADIOGRAPHIC STUDIES: I have personally reviewed the radiological images as listed and agreed with the findings in the report. No results found.  ASSESSMENT & PLAN:   Cancer of ascending colon ([place]) #Right colon cancer stage IV- liver metastases; adenocarcinoma; currently 5FU + avastin on hold-see below; June 2022 CT scan chest and pelvis/noncontrast-  No evidence of progressive metastatic disease in the chest,abdomen or pelvis on noncontrast imaging. The known lesion in thedome of the right hepatic lobe is not well visualized.  Left perihilar treatment changes no evidence of local recurrence.  Stable.  #Continue to monitor for now/continue chemo holiday.  # LEFT hilar/LUL-  Limited stage small cell cancer-s/p chemo-RT;june  2022-CT chest-stable.  #Perianal/rectal abscess-s/p drainage  s/p  antibiotics- almost resolved-stable  # Bil LE pain esp on  exertion-clinically suspect PVD; Awaiting on aug 3rd. With Dr.dew.   # Anemia- secondary- sec to chemo-hemoglobin on 11-12-stable  #Hyponatremia sodium 127-over all STABLE-  ? Etiology; limit 1.5 lit free water stable  # Back pain- MRI lumbar spine- July 2021-NEG for mets; severe arthritic changes; spinal canal stenosis-stable hydrocodone 1 day prn stable  # Poorly controlled diabetes blood  sugars-better controlled as per patients-overall stable [id number] dsicussed re:   # HTN- 120s/ 70-OFF[[ Avastin]-stable continue current anti-HTNs.   # DISPOSITION:  # follow up in 4 weeks-MD; labs-cbc/cmp/cea;NO chemo; port flush---Dr.B   All questions were answered. The patient knows to call the clinic with any problems, questions or concerns.    Cammie Sickle, MD 02/02/2021 10:41 PM

## 2021-01-19 NOTE — Progress Notes (Signed)
Doing well. Appetite is reported as fair. Does drink protein shakes. Bowels move normally. Is having bilateral leg pains, he has an appt with Vascular on 01/30/21.

## 2021-01-20 LAB — CEA: CEA: 3.4 ng/mL (ref 0.0–4.7)

## 2021-01-20 MED ORDER — HEPARIN SOD (PORK) LOCK FLUSH 100 UNIT/ML IV SOLN
INTRAVENOUS | Status: AC
  Filled 2021-01-20: qty 5

## 2021-01-23 ENCOUNTER — Other Ambulatory Visit (INDEPENDENT_AMBULATORY_CARE_PROVIDER_SITE_OTHER): Payer: Self-pay | Admitting: Nurse Practitioner

## 2021-01-23 DIAGNOSIS — I739 Peripheral vascular disease, unspecified: Secondary | ICD-10-CM

## 2021-01-25 ENCOUNTER — Encounter (INDEPENDENT_AMBULATORY_CARE_PROVIDER_SITE_OTHER): Payer: Self-pay | Admitting: Nurse Practitioner

## 2021-01-25 ENCOUNTER — Ambulatory Visit (INDEPENDENT_AMBULATORY_CARE_PROVIDER_SITE_OTHER): Payer: BC Managed Care – PPO

## 2021-01-25 ENCOUNTER — Other Ambulatory Visit: Payer: Self-pay

## 2021-01-25 ENCOUNTER — Ambulatory Visit (INDEPENDENT_AMBULATORY_CARE_PROVIDER_SITE_OTHER): Payer: BC Managed Care – PPO | Admitting: Nurse Practitioner

## 2021-01-25 VITALS — BP 158/66 | HR 69 | Ht 68.0 in | Wt 141.0 lb

## 2021-01-25 DIAGNOSIS — I739 Peripheral vascular disease, unspecified: Secondary | ICD-10-CM

## 2021-01-25 DIAGNOSIS — Z720 Tobacco use: Secondary | ICD-10-CM | POA: Diagnosis not present

## 2021-01-25 DIAGNOSIS — I10 Essential (primary) hypertension: Secondary | ICD-10-CM

## 2021-01-25 DIAGNOSIS — Z794 Long term (current) use of insulin: Secondary | ICD-10-CM

## 2021-01-25 DIAGNOSIS — E1140 Type 2 diabetes mellitus with diabetic neuropathy, unspecified: Secondary | ICD-10-CM

## 2021-01-25 DIAGNOSIS — I70221 Atherosclerosis of native arteries of extremities with rest pain, right leg: Secondary | ICD-10-CM

## 2021-01-25 MED ORDER — CLOPIDOGREL BISULFATE 75 MG PO TABS: 75.0000 mg | ORAL_TABLET | Freq: Every day | ORAL | 6 refills | Status: DC

## 2021-01-25 NOTE — Progress Notes (Signed)
Subjective:    Patient ID: Kevin Mckay, male    DOB: 1950-10-10, 70 y.o.   MRN: [id number] Chief Complaint  Patient presents with   New Patient (Initial Visit)    NP Burlene Arnt PAD [place]    Kevin Mckay is a 70 year old male that is seen for evaluation of painful lower extremities and diminished pulses. Patient notes the pain is always associated with activity and is very consistent day today. Typically, the pain occurs at less than one block, progress is as activity continues to the point that the patient must stop walking. Resting including standing still for several minutes allowed resumption of the activity and the ability to walk a similar distance before stopping again. Uneven terrain and inclined shorten the distance. The pain has been progressive over the past several years. The patient states the inability to walk is now having a profound negative impact on quality of life and daily activities.  The patient endorses some rest pain or dangling of an extremity off the side of the bed during the night for relief. No open wounds or sores at this time. No prior interventions or surgeries.  Today the patient has an ABI of 0.71 on the right and 0.98 on the left.  The patient has monophasic tibial artery waveforms on the right with biphasic waveforms on the left.  The left foot has good toe waveforms with dampened waveforms on the right    Review of Systems  Cardiovascular:  Positive for leg swelling.      Objective:   Physical Exam Vitals reviewed.  HENT:     Head: Normocephalic.  Cardiovascular:     Rate and Rhythm: Normal rate.     Pulses:          Dorsalis pedis pulses are detected w/ Doppler on the right side and 1+ on the left side.       Posterior tibial pulses are detected w/ Doppler on the right side and 1+ on the left side.  Pulmonary:     Effort: Pulmonary effort is normal.  Musculoskeletal:     Right lower leg: Edema present.     Left lower leg: Edema present.   Neurological:     Mental Status: He is alert and oriented to person, place, and time.  Psychiatric:        Mood and Affect: Mood normal.        Behavior: Behavior normal.        Thought Content: Thought content normal.        Judgment: Judgment normal.    BP (!) 158/66   Pulse 69   Ht 5\' 8"  (1.727 m)   Wt 141 lb (64 kg)   BMI 21.44 kg/m   Past Medical History:  Diagnosis Date   Asthma    Cancer ([place])    Metastatic small cell lung cancer   Cerebral aneurysm    Chronic painful diabetic neuropathy (HCC)    Depression    Diabetes mellitus without complication (HCC)    Essential hypertension    History of kidney stones    Occasional tremors    Tobacco use     Social History   Socioeconomic History   Marital status: Married    Spouse name: Not on file   Number of children: Not on file   Years of education: Not on file   Highest education level: Not on file  Occupational History   Not on file  Tobacco Use   Smoking  status: Every Day    Packs/day: 0.50    Years: 50.00    Pack years: 25.00    Types: Cigarettes   Smokeless tobacco: Never  Vaping Use   Vaping Use: Every day   Substances: Nicotine  Substance and Sexual Activity   Alcohol use: No   Drug use: Yes    Types: Barbituates, Marijuana   Sexual activity: Not on file  Other Topics Concern   Not on file  Social History Narrative   Lives in [place]; wife/ son/grandson [custody]; smoker; vending business; hx of alcoholism- quit at 37.    Social Determinants of Health   Financial Resource Strain: Not on file  Food Insecurity: Not on file  Transportation Needs: Not on file  Physical Activity: Not on file  Stress: Not on file  Social Connections: Not on file  Intimate Partner Violence: Not on file    Past Surgical History:  Procedure Laterality Date   ABDOMINAL SURGERY     age 60. trauma surgery due to forklift injury- liver and spleen   COLONOSCOPY WITH PROPOFOL N/A 03/27/2019   Procedure:  COLONOSCOPY WITH PROPOFOL;  Surgeon: Jonathon Bellows, MD;  Location: [facility] ENDOSCOPY;  Service: Gastroenterology;  Laterality: N/A;   COLOSTOMY REVISION Right 05/04/2019   Procedure: COLON RESECTION RIGHT-right hemicolectomy-open;  Surgeon: Fredirick Maudlin, MD;  Location: [facility] ORS;  Service: General;  Laterality: Right;   ESOPHAGOGASTRODUODENOSCOPY (EGD) WITH PROPOFOL N/A 03/27/2019   Procedure: ESOPHAGOGASTRODUODENOSCOPY (EGD) WITH PROPOFOL;  Surgeon: Jonathon Bellows, MD;  Location: [facility] ENDOSCOPY;  Service: Gastroenterology;  Laterality: N/A;   IR GENERIC HISTORICAL  05/31/2016   IR ANGIO VERTEBRAL SEL VERTEBRAL BILAT MOD SED 05/31/2016 Consuella Lose, MD [facility]   IR GENERIC HISTORICAL  05/31/2016   IR ANGIO INTRA EXTRACRAN SEL INTERNAL CAROTID BILAT MOD SED 05/31/2016 Consuella Lose, MD [facility]   PORTACATH PLACEMENT Right 02/04/2019   Procedure: INSERTION PORT-A-CATH;  Surgeon: [name], MD;  Location: [facility] ORS;  Service: General;  Laterality: Right;   THORACOTOMY Left 01/22/2019   Procedure: PRE OP BRONCH LEFT ANTERIOR THORACOTOMY WITH BIOPSY OF MEDIASTINAL MASS;  Surgeon: [name], MD;  Location: [facility] ORS;  Service: General;  Laterality: Left;    Family History  Problem Relation Age of Onset   Lymphoma Father    Liver cancer Paternal Uncle    Lung cancer Paternal Uncle    Lung cancer Maternal Uncle     Allergies  Allergen Reactions   Ferumoxytol Shortness Of Breath and Other (See Comments)    Infusion reaction-shortness of breath/ tachycardia/hypotension.   Amoxicillin Hives    Did it involve swelling of the face/tongue/throat, SOB, or low BP? No Did it involve sudden or severe rash/hives, skin peeling, or any reaction on the inside of your mouth or nose? Yes Did you need to seek medical attention at a hospital or doctor's office? No When did it last happen?~5 years ago If all above answers are "NO", may proceed with cephalosporin use.    Codeine Nausea Only     CBC Latest Ref Rng & Units 01/19/2021 12/22/2020 12/14/2020  WBC 4.0 - 10.5 K/uL 6.7 6.7 7.8  Hemoglobin 13.0 - 17.0 g/dL 10.7(L) 11.5(L) 11.6(L)  Hematocrit 39.0 - 52.0 % 31.8(L) 33.9(L) 32.9(L)  Platelets 150 - 400 K/uL 137(L) 263 216      CMP     Component Value Date/Time   NA 130 (L) 01/19/2021 0953   K 4.6 01/19/2021 0953   CL 96 (L) 01/19/2021 0953   CO2  25 01/19/2021 0953   GLUCOSE 148 (H) 01/19/2021 0953   BUN 19 01/19/2021 0953   CREATININE 0.94 01/19/2021 0953   CALCIUM 9.1 01/19/2021 0953   PROT 6.5 01/19/2021 0953   ALBUMIN 3.6 01/19/2021 0953   AST 18 01/19/2021 0953   ALT 14 01/19/2021 0953   ALKPHOS 46 01/19/2021 0953   BILITOT 0.6 01/19/2021 0953   GFRNONAA >60 01/19/2021 0953   GFRAA >60 03/23/2020 0850     No results found.     Assessment & Plan:   1. 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  However at this time the patient does not wish to undergo intervention.  We discussed signs and symptoms of worsening possible limb threatening symptoms including consistent pain, worsening nightly pain, new wounds or ulcerations or discolorations.  Temperature changes are also of concern.  We will also place the patient on Plavix as a precautionary measure as well.  We will have the patient return to the office in 6 months to repeat noninvasive studies or sooner if issues arise.  The patient also complains of lower extremity edema.  He is advised to utilize medical grade compression stockings daily of 20 to 30 mmHg he should also elevate his lower extremities with active. - clopidogrel (PLAVIX) 75 MG tablet; Take 1 tablet (75 mg total) by mouth daily.  Dispense: 30 tablet; Refill: 6  2. Tobacco use Smoking cessation was discussed, 3-10 minutes spent on this topic specifically   3. Essential hypertension Continue antihypertensive medications as already  ordered, these medications have been reviewed and there are no changes at this time.   4. Type 2 diabetes mellitus with diabetic neuropathy, with long-term current use of insulin ([place]) Continue hypoglycemic medications as already ordered, these medications have been reviewed and there are no changes at this time.  Hgb A1C to be monitored as already arranged by primary service    Current Outpatient Medications on File Prior to Visit  Medication Sig Dispense Refill   amLODipine (NORVASC) 10 MG tablet Take 10 mg by mouth every morning.      budesonide-formoterol (SYMBICORT) 160-4.5 MCG/ACT inhaler Inhale 2 puffs into the lungs 2 (two) times daily.     desonide (DESOWEN) 0.05 % cream Apply topically.     DULoxetine (CYMBALTA) 30 MG capsule Take 30 mg by mouth every morning.      gabapentin (NEURONTIN) 800 MG tablet Take 800 mg by mouth 3 (three) times daily.     HYDROcodone-acetaminophen (NORCO/VICODIN) 5-325 MG tablet Take 1 tablet by mouth every 6 (six) hours as needed for moderate pain. 45 tablet 0   insulin glargine (LANTUS) 100 UNIT/ML injection Inject into the skin daily. Units based By sliding scale     lidocaine-prilocaine (EMLA) cream Apply 1 application topically as needed. 30-45 mins prior to port access. 30 g 0   lisinopril-hydrochlorothiazide (ZESTORETIC) 10-12.5 MG tablet Take 1 tablet by mouth daily.     lovastatin (MEVACOR) 20 MG tablet Take 20 mg by mouth every evening.     metFORMIN (GLUCOPHAGE) 1000 MG tablet Take 1,000 mg by mouth 2 (two) times daily with a meal.      metFORMIN (GLUCOPHAGE-XR) 500 MG 24 hr tablet Take 1,000 mg by mouth 2 (two) times daily.     omeprazole (PRILOSEC) 40 MG capsule Take 1 capsule (40 mg total) by mouth daily. 90 capsule 1   ondansetron (ZOFRAN) 8  MG tablet Take 1 tablet (8 mg total) by mouth every 8 (eight) hours as needed for nausea or vomiting (start 3 days; after chemo). 40 tablet 1   polyethylene glycol powder (MIRALAX) 17 GM/SCOOP powder Mix  full container in 64 ounces of Gatorade or other clear liquid. NO RED Liquids 238 g 0   prazosin (MINIPRESS) 1 MG capsule Take 1 mg by mouth at bedtime.     pyridOXINE (VITAMIN B-6) 100 MG tablet Take 100 mg by mouth daily.     sitaGLIPtin (JANUVIA) 25 MG tablet Take 25 mg by mouth daily.     Ipratropium-Albuterol (COMBIVENT) 20-100 MCG/ACT AERS respimat Inhale into the lungs.     Current Facility-Administered Medications on File Prior to Visit  Medication Dose Route Frequency Provider Last Rate Last Admin   sodium chloride flush (NS) 0.9 % injection 10 mL  10 mL Intravenous PRN Cammie Sickle, MD   10 mL at 09/13/20 0943    There are no Patient Instructions on file for this visit. No follow-ups on file.   Kris Hartmann, NP

## 2021-02-02 ENCOUNTER — Encounter: Payer: Self-pay | Admitting: Internal Medicine

## 2021-02-08 ENCOUNTER — Other Ambulatory Visit: Payer: Self-pay | Admitting: *Deleted

## 2021-02-08 DIAGNOSIS — C801 Malignant (primary) neoplasm, unspecified: Secondary | ICD-10-CM

## 2021-02-08 DIAGNOSIS — C182 Malignant neoplasm of ascending colon: Secondary | ICD-10-CM

## 2021-02-08 DIAGNOSIS — C781 Secondary malignant neoplasm of mediastinum: Secondary | ICD-10-CM

## 2021-02-16 ENCOUNTER — Inpatient Hospital Stay: Payer: BC Managed Care – PPO | Admitting: Internal Medicine

## 2021-02-16 ENCOUNTER — Inpatient Hospital Stay: Payer: BC Managed Care – PPO | Attending: Internal Medicine

## 2021-02-16 ENCOUNTER — Encounter: Payer: Self-pay | Admitting: Internal Medicine

## 2021-02-16 ENCOUNTER — Other Ambulatory Visit: Payer: Self-pay

## 2021-02-16 VITALS — BP 136/62 | HR 66 | Temp 96.6°F | Resp 16 | Ht 68.0 in | Wt 142.4 lb

## 2021-02-16 DIAGNOSIS — C182 Malignant neoplasm of ascending colon: Secondary | ICD-10-CM | POA: Insufficient documentation

## 2021-02-16 DIAGNOSIS — C801 Malignant (primary) neoplasm, unspecified: Secondary | ICD-10-CM

## 2021-02-16 DIAGNOSIS — E611 Iron deficiency: Secondary | ICD-10-CM

## 2021-02-16 DIAGNOSIS — D649 Anemia, unspecified: Secondary | ICD-10-CM | POA: Insufficient documentation

## 2021-02-16 DIAGNOSIS — C781 Secondary malignant neoplasm of mediastinum: Secondary | ICD-10-CM

## 2021-02-16 LAB — COMPREHENSIVE METABOLIC PANEL
ALT: 12 U/L (ref 0–44)
AST: 17 U/L (ref 15–41)
Albumin: 3.7 g/dL (ref 3.5–5.0)
Alkaline Phosphatase: 62 U/L (ref 38–126)
Anion gap: 8 (ref 5–15)
BUN: 14 mg/dL (ref 8–23)
CO2: 25 mmol/L (ref 22–32)
Calcium: 8.7 mg/dL — ABNORMAL LOW (ref 8.9–10.3)
Chloride: 98 mmol/L (ref 98–111)
Creatinine, Ser: 1.08 mg/dL (ref 0.61–1.24)
GFR, Estimated: >60 mL/min (ref >60)
Glucose, Bld: 226 mg/dL — ABNORMAL HIGH (ref 70–99)
Potassium: 4.1 mmol/L (ref 3.5–5.1)
Sodium: 131 mmol/L — ABNORMAL LOW (ref 135–145)
Total Bilirubin: 0.3 mg/dL (ref 0.3–1.2)
Total Protein: 6.4 g/dL — ABNORMAL LOW (ref 6.5–8.1)

## 2021-02-16 LAB — IRON AND TIBC
Iron: 28 ug/dL — ABNORMAL LOW (ref 45–182)
Saturation Ratios: 10 % — ABNORMAL LOW (ref 17.9–39.5)
TIBC: 283 ug/dL (ref 250–450)
UIBC: 255 ug/dL

## 2021-02-16 LAB — CBC WITH DIFFERENTIAL/PLATELET
Abs Immature Granulocytes: 0.02 10*3/uL (ref 0.00–0.07)
Basophils Absolute: 0 10*3/uL (ref 0.0–0.1)
Basophils Relative: 1 %
Eosinophils Absolute: 0.6 10*3/uL — ABNORMAL HIGH (ref 0.0–0.5)
Eosinophils Relative: 8 %
HCT: 31.2 % — ABNORMAL LOW (ref 39.0–52.0)
Hemoglobin: 10.3 g/dL — ABNORMAL LOW (ref 13.0–17.0)
Immature Granulocytes: 0 %
Lymphocytes Relative: 28 %
Lymphs Abs: 2 10*3/uL (ref 0.7–4.0)
MCH: 28.3 pg (ref 26.0–34.0)
MCHC: 33 g/dL (ref 30.0–36.0)
MCV: 85.7 fL (ref 80.0–100.0)
Monocytes Absolute: 0.5 10*3/uL (ref 0.1–1.0)
Monocytes Relative: 8 %
Neutro Abs: 3.9 10*3/uL (ref 1.7–7.7)
Neutrophils Relative %: 55 %
Platelets: 160 10*3/uL (ref 150–400)
RBC: 3.64 MIL/uL — ABNORMAL LOW (ref 4.22–5.81)
RDW: 15.9 % — ABNORMAL HIGH (ref 11.5–15.5)
WBC: 7 10*3/uL (ref 4.0–10.5)
nRBC: 0 % (ref 0.0–0.2)

## 2021-02-16 LAB — FERRITIN: Ferritin: 35 ng/mL (ref 24–336)

## 2021-02-16 MED ORDER — HEPARIN SOD (PORK) LOCK FLUSH 100 UNIT/ML IV SOLN
500.0000 [IU] | Freq: Once | INTRAVENOUS | Status: AC
  Administered 2021-02-16: 500 [IU] via INTRAVENOUS
  Filled 2021-02-16: qty 5

## 2021-02-16 MED ORDER — SODIUM CHLORIDE 0.9% FLUSH
10.0000 mL | Freq: Once | INTRAVENOUS | Status: AC
  Administered 2021-02-16: 10 mL
  Filled 2021-02-16: qty 10

## 2021-02-16 MED ORDER — HEPARIN SOD (PORK) LOCK FLUSH 100 UNIT/ML IV SOLN
INTRAVENOUS | Status: AC
  Filled 2021-02-16: qty 5

## 2021-02-16 NOTE — Progress Notes (Signed)
[facility] NOTE  Patient Care Team: Laneta Simmers, NP (Inactive) as PCP - General (Nurse Practitioner) Telford Nab, RN as Registered Nurse Clent Jacks, RN as Oncology Nurse Navigator  CHIEF COMPLAINTS/PURPOSE OF CONSULTATION: Lung cancer/colon cancer   Oncology History Overview Note  # July 2020- SMALL CELL CA METASTATIC TO MEDIASTINAL LN [open Bx; Dr.Oaks]; TxN2M0; July 2nd 2020-PET- 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  MRI brain negative  # aug 17th 2020-carbo etoposide-RT [? 8/19]; #4 cycle carbo-Etop [finished dec 30th,2020]  #August 2020 iron deficient anemia-question etiology; IV Feraheme [no colonoscopy]  # OCT 2020- colo/ Cecal adeno ca; MRI liver- 1 cm enhancing lesion "metastasis" [Dr.Anna/Dr.Cannon]-difficulty biopsy.  November 02-2019 right hemicolectomy- STAGE III [pT3pN1 (2/13LN)]- HOLD adjuvant therapy. MMR- INTACT/LOW  #May 2021-liver biopsy-possible adenocarcinoma; colorectal origin.  Stage IV colon cancer  #May 24-2021-FOLFOX with Avastin; AUG 31st,2021- STABLE liver lesion; SEP 1st, 2021- Discontinue LV+Ox sec to severe fatigue/PN; cont 5FU CIV + Avastin  # COPD/  DM-2- on OHA/ smoker/ PVD/peripheral neuropathy  # History of alcohol abuse/quit 2014/ Hx of abdominal trauma [at 21y]  #July 2021-foundation 1 NGS [liver metastases]- RAS WILD TYPE **  # Jan 1st, 2022-Covid infection [prior vaccination; monoclonal infusion-not hospitalized]  # DIAGNOSIS:   # SMALL CELL CA-limited stage-status post chemoradiation;   # colon cancer-stage IV-solitary liver lesion  GOALS: Control  CURRENT/MOST RECENT THERAPY : FOLFOX with Avastin   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 ([place])  01/29/2019 Initial Diagnosis   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 [facility])   02/09/2019 - 06/24/2019 Chemotherapy   The patient had palonosetron (ALOXI)  injection 0.25 mg, 0.25 mg, Intravenous,  Once, 4 of 4 cycles Administration: 0.25 mg (02/09/2019), 0.25 mg (03/03/2019), 0.25 mg (06/02/2019), 0.25 mg (06/22/2019) CARBOplatin (PARAPLATIN) 410 mg in sodium chloride 0.9 % 250 mL chemo infusion, 410 mg (100 % of original dose 414 mg), Intravenous,  Once, 4 of 4 cycles Dose modification:   (original dose 414 mg, Cycle 1) Administration: 410 mg (02/09/2019), 410 mg (03/03/2019), 410 mg (06/02/2019), 410 mg (06/22/2019) etoposide (VEPESID) 180 mg in sodium chloride 0.9 % 500 mL chemo infusion, 100 mg/m2 = 180 mg, Intravenous,  Once, 4 of 4 cycles Administration: 180 mg (02/09/2019), 180 mg (02/10/2019), 180 mg (02/11/2019), 180 mg (03/03/2019), 180 mg (03/04/2019), 180 mg (03/05/2019), 180 mg (06/02/2019), 180 mg (06/03/2019), 180 mg (06/04/2019), 180 mg (06/22/2019), 180 mg (06/23/2019), 180 mg (06/24/2019) fosaprepitant (EMEND) 150 mg, dexamethasone (DECADRON) 6 mg in sodium chloride 0.9 % 145 mL IVPB, , Intravenous,  Once, 2 of 2 cycles Administration:  (06/02/2019),  (06/22/2019)   for chemotherapy treatment.     Cancer of ascending colon ([place])  05/04/2019 Initial Diagnosis   Cancer of ascending colon ([place])   11/30/2019 -  Chemotherapy    Patient is on Treatment Plan: COLORECTAL FOLFOX + BEVACIZUMAB Q14D        HISTORY OF PRESENTING ILLNESS:   Kevin Mckay 70 y.o.  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  In the interim evaluated by vascular recommended further evaluation of his lower extremity arteries.  Denies any blood in stools or black or stools.  No nausea no vomiting.  No abdominal pain  Review of Systems  Constitutional:  Positive for malaise/fatigue and weight loss. Negative  for chills, diaphoresis and fever.  HENT:  Negative for nosebleeds and sore throat.    Eyes:  Negative for double vision.  Respiratory:  Negative for hemoptysis and wheezing.   Cardiovascular:  Negative for chest pain, palpitations and orthopnea.  Gastrointestinal:  Negative for abdominal pain, blood in stool, constipation, diarrhea, heartburn, melena, nausea and vomiting.  Genitourinary:  Negative for dysuria, frequency and urgency.  Musculoskeletal:  Positive for back pain and joint pain.  Skin: Negative.  Negative for itching and rash.  Neurological:  Positive for tingling. Negative for dizziness, focal weakness and weakness.  Endo/Heme/Allergies:  Does not bruise/bleed easily.  Psychiatric/Behavioral:  Negative for depression. The patient is not nervous/anxious and does not have insomnia.     MEDICAL HISTORY:  Past Medical History:  Diagnosis Date   Asthma    Cancer ([place])    Metastatic small cell lung cancer   Cerebral aneurysm    Chronic painful diabetic neuropathy (HCC)    Depression    Diabetes mellitus without complication ([place])    Essential hypertension    History of kidney stones    Occasional tremors    Tobacco use     SURGICAL HISTORY: Past Surgical History:  Procedure Laterality Date   ABDOMINAL SURGERY     age 66. trauma surgery due to forklift injury- liver and spleen   COLONOSCOPY WITH PROPOFOL N/A 03/27/2019   Procedure: COLONOSCOPY WITH PROPOFOL;  Surgeon: Jonathon Bellows, MD;  Location: [facility] ENDOSCOPY;  Service: Gastroenterology;  Laterality: N/A;   COLOSTOMY REVISION Right 05/04/2019   Procedure: COLON RESECTION RIGHT-right hemicolectomy-open;  Surgeon: Fredirick Maudlin, MD;  Location: [facility] ORS;  Service: General;  Laterality: Right;   ESOPHAGOGASTRODUODENOSCOPY (EGD) WITH PROPOFOL N/A 03/27/2019   Procedure: ESOPHAGOGASTRODUODENOSCOPY (EGD) WITH PROPOFOL;  Surgeon: Jonathon Bellows, MD;  Location: [facility] ENDOSCOPY;  Service: Gastroenterology;  Laterality: N/A;   IR GENERIC HISTORICAL  05/31/2016   IR ANGIO VERTEBRAL SEL VERTEBRAL BILAT MOD SED 05/31/2016  Consuella Lose, MD [facility]-INTER[facility] RAD   IR GENERIC HISTORICAL  05/31/2016   IR ANGIO INTRA EXTRACRAN SEL INTERNAL CAROTID BILAT MOD SED 05/31/2016 Consuella Lose, MD [facility]   PORTACATH PLACEMENT Right 02/04/2019   Procedure: INSERTION PORT-A-CATH;  Surgeon: [name], MD;  Location: [facility] ORS;  Service: General;  Laterality: Right;   THORACOTOMY Left 01/22/2019   Procedure: PRE OP BRONCH LEFT ANTERIOR THORACOTOMY WITH BIOPSY OF MEDIASTINAL MASS;  Surgeon: [name], MD;  Location: [facility] ORS;  Service: General;  Laterality: Left;    SOCIAL HISTORY: Social History   Socioeconomic History   Marital status: Married    Spouse name: Not on file   Number of children: Not on file   Years of education: Not on file   Highest education level: Not on file  Occupational History   Not on file  Tobacco Use   Smoking status: Every Day    Packs/day: 0.50    Years: 50.00    Pack years: 25.00    Types: Cigarettes   Smokeless tobacco: Never  Vaping Use   Vaping Use: Every day   Substances: Nicotine  Substance and Sexual Activity   Alcohol use: No   Drug use: Yes    Types: Barbituates, Marijuana   Sexual activity: Not on file  Other Topics Concern   Not on file  Social History Narrative   Lives in [place]; wife/ son/grandson [custody]; smoker; vending business; hx of alcoholism- quit at 57.    Social Determinants of Health   Financial Resource Strain: Not  on file  Food Insecurity: Not on file  Transportation Needs: Not on file  Physical Activity: Not on file  Stress: Not on file  Social Connections: Not on file  Intimate Partner Violence: Not on file    FAMILY HISTORY: Family History  Problem Relation Age of Onset   Lymphoma Father    Liver cancer Paternal Uncle    Lung cancer Paternal Uncle    Lung cancer Maternal Uncle     ALLERGIES:  is allergic to ferumoxytol, amoxicillin, and codeine.  MEDICATIONS:  Current Outpatient Medications  Medication Sig Dispense  Refill   amLODipine (NORVASC) 10 MG tablet Take 10 mg by mouth every morning.      budesonide-formoterol (SYMBICORT) 160-4.5 MCG/ACT inhaler Inhale 2 puffs into the lungs 2 (two) times daily.     clopidogrel (PLAVIX) 75 MG tablet Take 1 tablet (75 mg total) by mouth daily. 30 tablet 6   desonide (DESOWEN) 0.05 % cream Apply topically.     DULoxetine (CYMBALTA) 30 MG capsule Take 30 mg by mouth every morning.      gabapentin (NEURONTIN) 800 MG tablet Take 800 mg by mouth 3 (three) times daily.     HYDROcodone-acetaminophen (NORCO/VICODIN) 5-325 MG tablet Take 1 tablet by mouth every 6 (six) hours as needed for moderate pain. 45 tablet 0   insulin glargine (LANTUS) 100 UNIT/ML injection Inject into the skin daily. Units based By sliding scale     lidocaine-prilocaine (EMLA) cream Apply 1 application topically as needed. 30-45 mins prior to port access. 30 g 0   lisinopril-hydrochlorothiazide (ZESTORETIC) 10-12.5 MG tablet Take 1 tablet by mouth daily.     lovastatin (MEVACOR) 20 MG tablet Take 20 mg by mouth every evening.     metFORMIN (GLUCOPHAGE) 1000 MG tablet Take 1,000 mg by mouth 2 (two) times daily with a meal.      metFORMIN (GLUCOPHAGE-XR) 500 MG 24 hr tablet Take 1,000 mg by mouth 2 (two) times daily.     omeprazole (PRILOSEC) 40 MG capsule Take 1 capsule (40 mg total) by mouth daily. 90 capsule 1   ondansetron (ZOFRAN) 8 MG tablet Take 1 tablet (8 mg total) by mouth every 8 (eight) hours as needed for nausea or vomiting (start 3 days; after chemo). 40 tablet 1   polyethylene glycol powder (MIRALAX) 17 GM/SCOOP powder Mix full container in 64 ounces of Gatorade or other clear liquid. NO RED Liquids 238 g 0   prazosin (MINIPRESS) 1 MG capsule Take 1 mg by mouth at bedtime.     pyridOXINE (VITAMIN B-6) 100 MG tablet Take 100 mg by mouth daily.     sitaGLIPtin (JANUVIA) 25 MG tablet Take 25 mg by mouth daily.     sodium chloride 1 g tablet Take by mouth.     Ipratropium-Albuterol  (COMBIVENT) 20-100 MCG/ACT AERS respimat Inhale into the lungs.     No current facility-administered medications for this visit.   Facility-Administered Medications Ordered in Other Visits  Medication Dose Route Frequency Provider Last Rate Last Admin   heparin lock flush 100 UNIT/ML injection            sodium chloride flush (NS) 0.9 % injection 10 mL  10 mL Intravenous PRN Cammie Sickle, MD   10 mL at 09/13/20 0943      .  PHYSICAL EXAMINATION: ECOG PERFORMANCE STATUS: 0 - Asymptomatic  Vitals:   02/16/21 1004  BP: 136/62  Pulse: 66  Resp: 16  Temp: (!) 96.6 F (35.9 C)  SpO2: 100%   Filed Weights   02/16/21 1004  Weight: 142 lb 6.4 oz (64.6 kg)    Physical Exam HENT:     Head: Normocephalic and atraumatic.     Mouth/Throat:     Pharynx: No oropharyngeal exudate.  Eyes:     Pupils: Pupils are equal, round, and reactive to light.  Cardiovascular:     Rate and Rhythm: Normal rate and regular rhythm.  Pulmonary:     Effort: No respiratory distress.     Breath sounds: No wheezing.  Abdominal:     General: Bowel sounds are normal. There is no distension.     Palpations: Abdomen is soft. There is no mass.     Tenderness: There is no abdominal tenderness. There is no guarding or rebound.     Comments: Abdominal incision well-healed.  Musculoskeletal:        General: No tenderness. Normal range of motion.     Cervical back: Normal range of motion and neck supple.  Skin:    General: Skin is warm.  Neurological:     Mental Status: He is alert and oriented to person, place, and time.  Psychiatric:        Mood and Affect: Affect normal.    LABORATORY DATA:  I have reviewed the data as listed Lab Results  Component Value Date   WBC 7.1 03/16/2021   HGB 11.0 (L) 03/16/2021   HCT 33.2 (L) 03/16/2021   MCV 84.5 03/16/2021   PLT 148 (L) 03/16/2021   Recent Labs    03/23/20 0850 04/06/20 0813 01/19/21 0953 02/16/21 0935 03/16/21 0940  NA 136   < >  130* 131* 133*  K 3.6   < > 4.6 4.1 4.6  CL 100   < > 96* 98 99  CO2 25   < > _0 GLUCOSE 170*   < > 148* 226* 212*  BUN 17   < > _1 CREATININE 0.97   < > 0.94 1.08 1.06  CALCIUM 9.1   < > 9.1 8.7* 9.1  GFRNONAA >60   < > >60 >60 >60  GFRAA >60  --   --   --   --   PROT 6.9   < > 6.5 6.4* 6.9  ALBUMIN 3.8   < > 3.6 3.7 3.7  AST 20   < > _2 ALT 18   < > _3 ALKPHOS 59   < > 46 62 64  BILITOT 0.6   < > 0.6 0.3 0.5   < > = values in this interval not displayed.    RADIOGRAPHIC STUDIES: I have personally reviewed the radiological images as listed and agreed with the findings in the report. CT CHEST ABDOMEN PELVIS W CONTRAST  Result Date: 03/11/2021 CLINICAL DATA:  Restaging colorectal cancer. History of right hemicolectomy in November 2020. EXAM: CT CHEST, ABDOMEN, AND PELVIS WITH CONTRAST TECHNIQUE: Multidetector CT imaging of the chest, abdomen and pelvis was performed following the standard protocol during bolus administration of intravenous contrast. CONTRAST:  38m OMNIPAQUE IOHEXOL 350 MG/ML SOLN COMPARISON:  Multiple previous imaging studies. The most recent CT scan is 12/07/2020 FINDINGS: CT CHEST FINDINGS Cardiovascular: The heart is normal in size. No pericardial effusion. Stable atherosclerotic calcifications involving the thoracic aorta and branch vessels including age advanced three-vessel coronary artery calcifications. Mediastinum/Nodes: No mediastinal or hilar mass or lymphadenopathy. The esophagus is grossly normal. Lungs/Pleura: Stable changes of radiation fibrosis  involving the left hilum and left paramediastinal lung. I do not see any findings suspicious for recurrent tumor. Stable emphysematous changes. No acute pulmonary findings. No worrisome pulmonary nodules to suggest metastatic disease. Musculoskeletal: Remote left posterior rib fractures. CT ABDOMEN PELVIS FINDINGS Hepatobiliary: No hepatic lesions are identified. Prior small lesion at the  right hepatic dome is no longer identified. No new hepatic lesions. No intrahepatic biliary dilatation. The gallbladder is unremarkable. No common bile duct dilatation. Pancreas: No mass, inflammation or ductal dilatation. Spleen: Normal size. No splenic lesions. Adrenals/Urinary Tract: Adrenal glands and kidneys are unremarkable. No worrisome renal lesions or hydronephrosis. Mild uniform bladder wall thickening likely due to lack of distension. Stomach/Bowel: The stomach, duodenum, small bowel and colon are grossly normal. No acute inflammatory changes, mass lesions or obstructive findings. Stable surgical changes from a partial right colectomy. No findings suspicious for recurrent tumor. Stable severe sigmoid colon diverticulosis. Vascular/Lymphatic: Significant age advanced atherosclerotic calcifications involving the aorta and iliac arteries but no aneurysm or dissection. The major venous structures are patent. Stable fairly extensive perisplenic and perigastric collateral vessels. Reproductive: The prostate gland seminal vesicles are. Other: No ascites or abdominal wall hernia. Musculoskeletal: No significant bony findings. Stable advanced degenerative disc disease at L5-S1. IMPRESSION: 1. Stable changes of radiation fibrosis involving the left hilum and left paramediastinal lung. No findings suspicious for recurrent tumor. 2. No findings for metastatic disease involving the chest. 3. Stable surgical changes from a partial right colectomy. No findings suspicious for recurrent tumor or abdominal metastatic disease. Previous small right hepatic lobe lesion is not identified for certain on today's exam. 4. Stable age advanced atherosclerotic calcifications involving the thoracic and abdominal aorta and branch vessels including age advanced three-vessel coronary artery calcifications. 5. Stable extensive perisplenic and perigastric collateral vessels. 6. Stable severe sigmoid colon diverticulosis. Aortic  Atherosclerosis (ICD10-I70.0) and Emphysema (ICD10-J43.9). Electronically Signed   By: Marijo Sanes M.D.   On: 03/11/2021 17:27    ASSESSMENT & PLAN:   Cancer of ascending colon ([place]) #Right colon cancer stage IV- liver metastases; adenocarcinoma; currently 5FU + avastin on hold-see below; June 2022 CT scan chest and pelvis/noncontrast-  No evidence of progressive metastatic disease in the chest,abdomen or pelvis on noncontrast imaging. The known lesion in thedome of the right hepatic lobe is not well visualized.  Left perihilar treatment changes no evidence of local recurrence. STABLE.   #Continue to monitor for now/continue chemo holiday.  # LEFT hilar/LUL-  Limited stage small cell cancer-s/p chemo-RT;june  2022-CT chest-  Stable.   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   #Hyponatremia sodium 131- over all   Stable.-  ? Etiology; limit 1.5 lit free water stable  # Back pain- MRI lumbar spine- July 2021-NEG for mets; severe arthritic changes; spinal canal stenosis-stable hydrocodone 1 day prn -   Stable.  # Poorly controlled diabetes blood sugars-better controlled as per patients-overall stable FBG-226 discussed re: dietary changes.   # PVD [Dr.SChnier]- on plavix.   # HTN- 120s/ 70-OFF[[ Avastin]-stable continue current anti-HTNs.   # DISPOSITION:  # follow up in 4 weeks-MD; labs-cbc/cmp/cea;NO chemo; port flush; CT C/A/P--Dr.B   All questions were answered. The patient knows to call the clinic with any problems, questions or concerns.    Cammie Sickle, MD 03/22/2021 1:05 PM

## 2021-02-16 NOTE — Assessment & Plan Note (Addendum)
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 The known lesion in thedome of the right hepatic lobe is not well visualized.  Left perihilar treatment changes no evidence of local recurrence. STABLE.   #Continue to monitor for now/continue chemo holiday.  # LEFT hilar/LUL-  Limited stage small cell cancer-s/p chemo-RT;june  2022-CT chest-  Stable.   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   #Hyponatremia sodium 131- over all   Stable.-  ? Etiology; limit 1.5 lit free water stable  # Back pain- MRI lumbar spine- July 2021-NEG for mets; severe arthritic changes; spinal canal stenosis-stable hydrocodone 1 day prn -   Stable.  # Poorly controlled diabetes blood sugars-better controlled as per patients-overall stable FBG-226 discussed re: dietary changes.   # PVD [Dr.SChnier]- on plavix.   # HTN- 120s/ 70-OFF[[ Avastin]-stable continue current anti-HTNs.   # DISPOSITION:  # follow up in 4 weeks-MD; labs-cbc/cmp/cea;NO chemo; port flush; CT C/A/P--Dr.B

## 2021-02-17 LAB — CEA: CEA: 3 ng/mL (ref 0.0–4.7)

## 2021-03-10 ENCOUNTER — Ambulatory Visit
Admission: RE | Admit: 2021-03-10 | Discharge: 2021-03-10 | Disposition: A | Discharge status: Home / Self Care | Payer: BC Managed Care – PPO | Source: Ambulatory Visit | Attending: Internal Medicine | Admitting: Internal Medicine

## 2021-03-10 ENCOUNTER — Other Ambulatory Visit: Payer: Self-pay

## 2021-03-10 DIAGNOSIS — C182 Malignant neoplasm of ascending colon: Secondary | ICD-10-CM | POA: Diagnosis present

## 2021-03-10 DIAGNOSIS — C781 Secondary malignant neoplasm of mediastinum: Secondary | ICD-10-CM | POA: Insufficient documentation

## 2021-03-10 DIAGNOSIS — C801 Malignant (primary) neoplasm, unspecified: Secondary | ICD-10-CM | POA: Insufficient documentation

## 2021-03-10 MED ORDER — IOHEXOL 350 MG/ML SOLN
85.0000 mL | Freq: Once | INTRAVENOUS | Status: AC | PRN
  Administered 2021-03-10: 85 mL via INTRAVENOUS

## 2021-03-16 ENCOUNTER — Inpatient Hospital Stay: Payer: BC Managed Care – PPO | Admitting: Internal Medicine

## 2021-03-16 ENCOUNTER — Inpatient Hospital Stay: Payer: BC Managed Care – PPO | Attending: Internal Medicine

## 2021-03-16 DIAGNOSIS — C182 Malignant neoplasm of ascending colon: Secondary | ICD-10-CM | POA: Diagnosis not present

## 2021-03-16 DIAGNOSIS — D6481 Anemia due to antineoplastic chemotherapy: Secondary | ICD-10-CM | POA: Diagnosis not present

## 2021-03-16 DIAGNOSIS — C787 Secondary malignant neoplasm of liver and intrahepatic bile duct: Secondary | ICD-10-CM | POA: Diagnosis not present

## 2021-03-16 DIAGNOSIS — Z79899 Other long term (current) drug therapy: Secondary | ICD-10-CM | POA: Insufficient documentation

## 2021-03-16 DIAGNOSIS — C3412 Malignant neoplasm of upper lobe, left bronchus or lung: Secondary | ICD-10-CM | POA: Diagnosis not present

## 2021-03-16 DIAGNOSIS — I10 Essential (primary) hypertension: Secondary | ICD-10-CM | POA: Insufficient documentation

## 2021-03-16 DIAGNOSIS — M549 Dorsalgia, unspecified: Secondary | ICD-10-CM | POA: Diagnosis not present

## 2021-03-16 DIAGNOSIS — C801 Malignant (primary) neoplasm, unspecified: Secondary | ICD-10-CM

## 2021-03-16 DIAGNOSIS — F17210 Nicotine dependence, cigarettes, uncomplicated: Secondary | ICD-10-CM | POA: Diagnosis not present

## 2021-03-16 DIAGNOSIS — Z95828 Presence of other vascular implants and grafts: Secondary | ICD-10-CM

## 2021-03-16 DIAGNOSIS — E871 Hypo-osmolality and hyponatremia: Secondary | ICD-10-CM | POA: Insufficient documentation

## 2021-03-16 DIAGNOSIS — E1165 Type 2 diabetes mellitus with hyperglycemia: Secondary | ICD-10-CM | POA: Insufficient documentation

## 2021-03-16 DIAGNOSIS — Z794 Long term (current) use of insulin: Secondary | ICD-10-CM | POA: Insufficient documentation

## 2021-03-16 LAB — CBC WITH DIFFERENTIAL/PLATELET
Abs Immature Granulocytes: 0.02 10*3/uL (ref 0.00–0.07)
Basophils Absolute: 0.1 10*3/uL (ref 0.0–0.1)
Basophils Relative: 1 %
Eosinophils Absolute: 0.4 10*3/uL (ref 0.0–0.5)
Eosinophils Relative: 6 %
HCT: 33.2 % — ABNORMAL LOW (ref 39.0–52.0)
Hemoglobin: 11 g/dL — ABNORMAL LOW (ref 13.0–17.0)
Immature Granulocytes: 0 %
Lymphocytes Relative: 23 %
Lymphs Abs: 1.7 10*3/uL (ref 0.7–4.0)
MCH: 28 pg (ref 26.0–34.0)
MCHC: 33.1 g/dL (ref 30.0–36.0)
MCV: 84.5 fL (ref 80.0–100.0)
Monocytes Absolute: 0.5 10*3/uL (ref 0.1–1.0)
Monocytes Relative: 8 %
Neutro Abs: 4.4 10*3/uL (ref 1.7–7.7)
Neutrophils Relative %: 62 %
Platelets: 148 10*3/uL — ABNORMAL LOW (ref 150–400)
RBC: 3.93 MIL/uL — ABNORMAL LOW (ref 4.22–5.81)
RDW: 15 % (ref 11.5–15.5)
WBC: 7.1 10*3/uL (ref 4.0–10.5)
nRBC: 0 % (ref 0.0–0.2)

## 2021-03-16 LAB — COMPREHENSIVE METABOLIC PANEL
ALT: 13 U/L (ref 0–44)
AST: 17 U/L (ref 15–41)
Albumin: 3.7 g/dL (ref 3.5–5.0)
Alkaline Phosphatase: 64 U/L (ref 38–126)
Anion gap: 10 (ref 5–15)
BUN: 15 mg/dL (ref 8–23)
CO2: 24 mmol/L (ref 22–32)
Calcium: 9.1 mg/dL (ref 8.9–10.3)
Chloride: 99 mmol/L (ref 98–111)
Creatinine, Ser: 1.06 mg/dL (ref 0.61–1.24)
GFR, Estimated: >60 mL/min (ref >60)
Glucose, Bld: 212 mg/dL — ABNORMAL HIGH (ref 70–99)
Potassium: 4.6 mmol/L (ref 3.5–5.1)
Sodium: 133 mmol/L — ABNORMAL LOW (ref 135–145)
Total Bilirubin: 0.5 mg/dL (ref 0.3–1.2)
Total Protein: 6.9 g/dL (ref 6.5–8.1)

## 2021-03-16 MED ORDER — HEPARIN SOD (PORK) LOCK FLUSH 100 UNIT/ML IV SOLN
500.0000 [IU] | Freq: Once | INTRAVENOUS | Status: AC
  Filled 2021-03-16: qty 5

## 2021-03-16 MED ORDER — SODIUM CHLORIDE 0.9% FLUSH
10.0000 mL | INTRAVENOUS | Status: DC | PRN
  Administered 2021-03-16: 10 mL via INTRAVENOUS
  Filled 2021-03-16: qty 10

## 2021-03-16 MED ORDER — HEPARIN SOD (PORK) LOCK FLUSH 100 UNIT/ML IV SOLN
INTRAVENOUS | Status: AC
  Administered 2021-03-16: 500 [IU] via INTRAVENOUS
  Filled 2021-03-16: qty 5

## 2021-03-16 NOTE — Progress Notes (Signed)
Patient states he is having some shortness of breathe, weakness, and fatigue. 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 Patient states he passed out a couple times since the first time. Patient states Mri he had showed he had fractured some ribs from passing out and hitting the fire place.

## 2021-03-16 NOTE — Assessment & Plan Note (Signed)
#   Right colon cancer stage IV- liver metastases; adenocarcinoma; most recently on 5FU + avastin-chemotherapy currently on hold -because of mild to moderate intolerance/stability disease/patient preference/   # SEP 17th, 2022-CT scan chest and pelvis/noncontrast-  No evidence of progressive metastatic disease in the chest,abdomen or pelvis on noncontrast imaging. Left perihilar treatment changes no evidence of local recurrence- STABLE. Continue to monitor for now/continue chemo holiday. Will order CT scan-at next visit/scan every 4 months.  # LEFT hilar/LUL-  Limited stage small cell cancer-s/p chemo-RT; SEP  2022-CT chest-  Stable.   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   #Hyponatremia sodium 131- over all   STABLE.-  ? Etiology; limit 1.5 lit free water stable  # Back pain- MRI lumbar spine- July 2021-NEG for mets; severe arthritic changes; spinal canal stenosis-stable hydrocodone 1 day prn -STABLE.   # Poorly controlled diabetes blood sugars-better controlled as per patients-overall stable FBG-226 discussed re: dietary changes.   # Syncopal episodes/ discussed re: carotid dopplers-discussed risk of stroke.  Patient verbalized understanding however declines any further work-up at this time.  PVD [Dr.Schnier]- on plavix; AUU 2022- declines intervention. Again reviewed the importance of close follow-up with vascular/recommendations.   # HTN- 120s/ 70-OFF[[ Avastin]-stable continue current anti-HTNs.   # DISPOSITION:  # follow up in 2 months -MD; labs-cbc/cmp/cea; NO chemo; port flush; --Dr.B  # I reviewed the blood work- with the patient in detail; also reviewed the imaging independently [as summarized above]; and with the patient in detail.

## 2021-03-16 NOTE — Progress Notes (Signed)
Kevin Mckay  Patient Care Team: Kevin Simmers, NP (Inactive) as PCP - General (Nurse Practitioner) Kevin Nab, RN as Registered Nurse Kevin Jacks, RN as Oncology Nurse Navigator  CHIEF COMPLAINTS/PURPOSE OF CONSULTATION: Lung cancer/colon cancer   Oncology History Overview Mckay  # July 2020- SMALL CELL CA METASTATIC TO MEDIASTINAL LN [open Bx; Dr.Oaks]; TxN2M0; July 2nd 2020-PET- 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  MRI brain negative  # aug 17th 2020-carbo etoposide-RT [? 8/19]; #4 cycle carbo-Etop [finished dec 30th,2020]  #August 2020 iron deficient anemia-question etiology; IV Feraheme [no colonoscopy]  # OCT 2020- colo/ Cecal adeno ca; MRI liver- 1 cm enhancing lesion "metastasis" [Dr.Anna/Dr.Cannon]-difficulty biopsy.  November 02-2019 right hemicolectomy- STAGE III [pT3pN1 (2/13LN)]- HOLD adjuvant therapy. MMR- INTACT/LOW  #May 2021-liver biopsy-possible adenocarcinoma; colorectal origin.  Stage IV colon cancer  #May 24-2021-FOLFOX with Avastin; AUG 31st,2021- STABLE liver lesion; SEP 1st, 2021- Discontinue LV+Ox sec to severe fatigue/PN; cont 5FU CIV + Avastin  # COPD/  DM-2- on OHA/ smoker/ PVD/peripheral neuropathy  # History of alcohol abuse/quit 2014/ Hx of abdominal trauma [at 21y]  #July 2021-foundation 1 NGS [liver metastases]- RAS WILD TYPE **  # Jan 1st, 2022-Covid infection [prior vaccination; monoclonal infusion-not hospitalized]  # DIAGNOSIS:   # SMALL CELL CA-limited stage-status post chemoradiation;   # colon cancer-stage IV-solitary liver lesion  GOALS: Control  CURRENT/MOST RECENT THERAPY : FOLFOX with Avastin   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 ([place])  01/29/2019 Initial Diagnosis   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 [facility])   02/09/2019 - 06/24/2019 Chemotherapy   The patient had palonosetron (ALOXI)  injection 0.25 mg, 0.25 mg, Intravenous,  Once, 4 of 4 cycles Administration: 0.25 mg (02/09/2019), 0.25 mg (03/03/2019), 0.25 mg (06/02/2019), 0.25 mg (06/22/2019) CARBOplatin (PARAPLATIN) 410 mg in sodium chloride 0.9 % 250 mL chemo infusion, 410 mg (100 % of original dose 414 mg), Intravenous,  Once, 4 of 4 cycles Dose modification:   (original dose 414 mg, Cycle 1) Administration: 410 mg (02/09/2019), 410 mg (03/03/2019), 410 mg (06/02/2019), 410 mg (06/22/2019) etoposide (VEPESID) 180 mg in sodium chloride 0.9 % 500 mL chemo infusion, 100 mg/m2 = 180 mg, Intravenous,  Once, 4 of 4 cycles Administration: 180 mg (02/09/2019), 180 mg (02/10/2019), 180 mg (02/11/2019), 180 mg (03/03/2019), 180 mg (03/04/2019), 180 mg (03/05/2019), 180 mg (06/02/2019), 180 mg (06/03/2019), 180 mg (06/04/2019), 180 mg (06/22/2019), 180 mg (06/23/2019), 180 mg (06/24/2019) fosaprepitant (EMEND) 150 mg, dexamethasone (DECADRON) 6 mg in sodium chloride 0.9 % 145 mL IVPB, , Intravenous,  Once, 2 of 2 cycles Administration:  (06/02/2019),  (06/22/2019)   for chemotherapy treatment.     Cancer of ascending colon ([place])  05/04/2019 Initial Diagnosis   Cancer of ascending colon ([place])   11/30/2019 -  Chemotherapy    Patient is on Treatment Plan: COLORECTAL FOLFOX + BEVACIZUMAB Q14D        HISTORY OF PRESENTING ILLNESS: Alone.  Ambulating independently.  Kevin Mckay 70 y.o.  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  In the interim evaluated by evaluated by vascular-recommended vascular interventions.  Continues to have bilateral lower extremity pain not any worse.  However patient prefers medications/on Plavix.  Patient had episode of syncope while trying to help his son  out.  He did not seek any medical attention.  No  seizures.  Denies any strokelike symptoms.  No nausea no vomiting.  No headaches.   Review of Systems  Constitutional:  Positive for malaise/fatigue and weight loss. Negative for chills, diaphoresis and fever.  HENT:  Negative for nosebleeds and sore throat.   Eyes:  Negative for double vision.  Respiratory:  Negative for hemoptysis and wheezing.   Cardiovascular:  Negative for chest pain, palpitations and orthopnea.  Gastrointestinal:  Negative for abdominal pain, blood in stool, constipation, diarrhea, heartburn, melena, nausea and vomiting.  Genitourinary:  Negative for dysuria, frequency and urgency.  Musculoskeletal:  Positive for back pain and joint pain.  Skin: Negative.  Negative for itching and rash.  Neurological:  Positive for tingling. Negative for dizziness, focal weakness and weakness.  Endo/Heme/Allergies:  Does not bruise/bleed easily.  Psychiatric/Behavioral:  Negative for depression. The patient is not nervous/anxious and does not have insomnia.     MEDICAL HISTORY:  Past Medical History:  Diagnosis Date  . Asthma   . Cancer ([place])    Metastatic small cell lung cancer  . Cerebral aneurysm   . Chronic painful diabetic neuropathy ([place])   . Depression   . Diabetes mellitus without complication ([place])   . Essential hypertension   . History of kidney stones   . Occasional tremors   . Tobacco use     SURGICAL HISTORY: Past Surgical History:  Procedure Laterality Date  . ABDOMINAL SURGERY     age 78. trauma surgery due to forklift injury- liver and spleen  . COLONOSCOPY WITH PROPOFOL N/A 03/27/2019   Procedure: COLONOSCOPY WITH PROPOFOL;  Surgeon: Jonathon Bellows, MD;  Location: [facility] ENDOSCOPY;  Service: Gastroenterology;  Laterality: N/A;  . COLOSTOMY REVISION Right 05/04/2019   Procedure: COLON RESECTION RIGHT-right hemicolectomy-open;  Surgeon: Fredirick Maudlin, MD;  Location: [facility] ORS;  Service: General;  Laterality: Right;  . ESOPHAGOGASTRODUODENOSCOPY (EGD) WITH  PROPOFOL N/A 03/27/2019   Procedure: ESOPHAGOGASTRODUODENOSCOPY (EGD) WITH PROPOFOL;  Surgeon: Jonathon Bellows, MD;  Location: [facility] ENDOSCOPY;  Service: Gastroenterology;  Laterality: N/A;  . IR GENERIC HISTORICAL  05/31/2016   IR ANGIO VERTEBRAL SEL VERTEBRAL BILAT MOD SED 05/31/2016 Consuella Lose, MD [facility]-INTERV RAD  . IR GENERIC HISTORICAL  05/31/2016   IR ANGIO INTRA EXTRACRAN SEL INTERNAL CAROTID BILAT MOD SED 05/31/2016 Consuella Lose, MD [facility]INTER[facility]  . PORTACATH PLACEMENT Right 02/04/2019   Procedure: INSERTION PORT-A-CATH;  Surgeon: [name], MD;  Location: [facility] ORS;  Service: General;  Laterality: Right;  . THORACOTOMY Left 01/22/2019   Procedure: PRE OP BRONCH LEFT ANTERIOR THORACOTOMY WITH BIOPSY OF MEDIASTINAL MASS;  Surgeon: [name], MD;  Location: [facility] ORS;  Service: General;  Laterality: Left;    SOCIAL HISTORY: Social History   Socioeconomic History  . Marital status: Married    Spouse name: Not on file  . Number of children: Not on file  . Years of education: Not on file  . Highest education level: Not on file  Occupational History  . Not on file  Tobacco Use  . Smoking status: Every Day    Packs/day: 0.50    Years: 50.00    Pack years: 25.00    Types: Cigarettes  . Smokeless tobacco: Never  Vaping Use  . Vaping Use: Every day  . Substances: Nicotine  Substance and Sexual Activity  . Alcohol use: No  . Drug use: Yes    Types: Barbituates, Marijuana  . Sexual activity: Not on file  Other Topics  Concern  . Not on file  Social History Narrative   Lives in [place]; wife/ son/grandson [custody]; smoker; vending business; hx of alcoholism- quit at 35.    Social Determinants of Health   Financial Resource Strain: Not on file  Food Insecurity: Not on file  Transportation Needs: Not on file  Physical Activity: Not on file  Stress: Not on file  Social Connections: Not on file  Intimate Partner Violence: Not on file    FAMILY HISTORY: Family  History  Problem Relation Age of Onset  . Lymphoma Father   . Liver cancer Paternal Uncle   . Lung cancer Paternal Uncle   . Lung cancer Maternal Uncle     ALLERGIES:  is allergic to ferumoxytol, amoxicillin, and codeine.  MEDICATIONS:  Current Outpatient Medications  Medication Sig Dispense Refill  . amLODipine (NORVASC) 10 MG tablet Take 10 mg by mouth every morning.     . budesonide-formoterol (SYMBICORT) 160-4.5 MCG/ACT inhaler Inhale 2 puffs into the lungs 2 (two) times daily.    . clopidogrel (PLAVIX) 75 MG tablet Take 1 tablet (75 mg total) by mouth daily. 30 tablet 6  . desonide (DESOWEN) 0.05 % cream Apply topically.    . DULoxetine (CYMBALTA) 30 MG capsule Take 30 mg by mouth every morning.     . gabapentin (NEURONTIN) 800 MG tablet Take 800 mg by mouth 3 (three) times daily.    Marland Kitchen HYDROcodone-acetaminophen (NORCO/VICODIN) 5-325 MG tablet Take 1 tablet by mouth every 6 (six) hours as needed for moderate pain. 45 tablet 0  . insulin glargine (LANTUS) 100 UNIT/ML injection Inject into the skin daily. Units based By sliding scale    . Ipratropium-Albuterol (COMBIVENT) 20-100 MCG/ACT AERS respimat Inhale into the lungs.    . lidocaine-prilocaine (EMLA) cream Apply 1 application topically as needed. 30-45 mins prior to port access. 30 g 0  . lisinopril-hydrochlorothiazide (ZESTORETIC) 10-12.5 MG tablet Take 1 tablet by mouth daily.    Marland Kitchen lovastatin (MEVACOR) 20 MG tablet Take 20 mg by mouth every evening.    . metFORMIN (GLUCOPHAGE) 1000 MG tablet Take 1,000 mg by mouth 2 (two) times daily with a meal.     . metFORMIN (GLUCOPHAGE-XR) 500 MG 24 hr tablet Take 1,000 mg by mouth 2 (two) times daily.    Marland Kitchen omeprazole (PRILOSEC) 40 MG capsule Take 1 capsule (40 mg total) by mouth daily. 90 capsule 1  . ondansetron (ZOFRAN) 8 MG tablet Take 1 tablet (8 mg total) by mouth every 8 (eight) hours as needed for nausea or vomiting (start 3 days; after chemo). 40 tablet 1  . polyethylene glycol  powder (MIRALAX) 17 GM/SCOOP powder Mix full container in 64 ounces of Gatorade or other clear liquid. NO RED Liquids 238 g 0  . prazosin (MINIPRESS) 1 MG capsule Take 1 mg by mouth at bedtime.    . pyridOXINE (VITAMIN B-6) 100 MG tablet Take 100 mg by mouth daily.    . sitaGLIPtin (JANUVIA) 25 MG tablet Take 25 mg by mouth daily.    . sodium chloride 1 g tablet Take by mouth.     No current facility-administered medications for this visit.   Facility-Administered Medications Ordered in Other Visits  Medication Dose Route Frequency Provider Last Rate Last Admin  . heparin lock flush 100 UNIT/ML injection           . sodium chloride flush (NS) 0.9 % injection 10 mL  10 mL Intravenous PRN Cammie Sickle, MD   10 mL  at 09/13/20 0943      .  PHYSICAL EXAMINATION: ECOG PERFORMANCE STATUS: 0 - Asymptomatic  Vitals:   03/16/21 0957  BP: 139/64  Pulse: 70  Resp: 20  Temp: 97.8 F (36.6 C)  SpO2: 100%   Filed Weights   03/16/21 0957  Weight: 142 lb 4.8 oz (64.5 kg)    Physical Exam HENT:     Head: Normocephalic and atraumatic.     Mouth/Throat:     Pharynx: No oropharyngeal exudate.  Eyes:     Pupils: Pupils are equal, round, and reactive to light.  Cardiovascular:     Rate and Rhythm: Normal rate and regular rhythm.  Pulmonary:     Effort: No respiratory distress.     Breath sounds: No wheezing.  Abdominal:     General: Bowel sounds are normal. There is no distension.     Palpations: Abdomen is soft. There is no mass.     Tenderness: There is no abdominal tenderness. There is no guarding or rebound.     Comments: Abdominal incision well-healed.  Musculoskeletal:        General: No tenderness. Normal range of motion.     Cervical back: Normal range of motion and neck supple.  Skin:    General: Skin is warm.  Neurological:     Mental Status: He is alert and oriented to person, place, and time.  Psychiatric:        Mood and Affect: Affect normal.     LABORATORY DATA:  I have reviewed the data as listed Lab Results  Component Value Date   WBC 7.1 03/16/2021   HGB 11.0 (L) 03/16/2021   HCT 33.2 (L) 03/16/2021   MCV 84.5 03/16/2021   PLT 148 (L) 03/16/2021   Recent Labs    03/23/20 0850 04/06/20 0813 01/19/21 0953 02/16/21 0935 03/16/21 0940  NA 136   < > 130* 131* 133*  K 3.6   < > 4.6 4.1 4.6  CL 100   < > 96* 98 99  CO2 25   < > _0 GLUCOSE 170*   < > 148* 226* 212*  BUN 17   < > _1 CREATININE 0.97   < > 0.94 1.08 1.06  CALCIUM 9.1   < > 9.1 8.7* 9.1  GFRNONAA >60   < > >60 >60 >60  GFRAA >60  --   --   --   --   PROT 6.9   < > 6.5 6.4* 6.9  ALBUMIN 3.8   < > 3.6 3.7 3.7  AST 20   < > _2 ALT 18   < > _3 ALKPHOS 59   < > 46 62 64  BILITOT 0.6   < > 0.6 0.3 0.5   < > = values in this interval not displayed.    RADIOGRAPHIC STUDIES: I have personally reviewed the radiological images as listed and agreed with the findings in the report. CT CHEST ABDOMEN PELVIS W CONTRAST  Result Date: 03/11/2021 CLINICAL DATA:  Restaging colorectal cancer. History of right hemicolectomy in November 2020. EXAM: CT CHEST, ABDOMEN, AND PELVIS WITH CONTRAST TECHNIQUE: Multidetector CT imaging of the chest, abdomen and pelvis was performed following the standard protocol during bolus administration of intravenous contrast. CONTRAST:  77m OMNIPAQUE IOHEXOL 350 MG/ML SOLN COMPARISON:  Multiple previous imaging studies. The most recent CT scan is 12/07/2020 FINDINGS: CT CHEST FINDINGS Cardiovascular: The heart is normal in  size. No pericardial effusion. Stable atherosclerotic calcifications involving the thoracic aorta and branch vessels including age advanced three-vessel coronary artery calcifications. Mediastinum/Nodes: No mediastinal or hilar mass or lymphadenopathy. The esophagus is grossly normal. Lungs/Pleura: Stable changes of radiation fibrosis involving the left hilum and left paramediastinal lung. I do not  see any findings suspicious for recurrent tumor. Stable emphysematous changes. No acute pulmonary findings. No worrisome pulmonary nodules to suggest metastatic disease. Musculoskeletal: Remote left posterior rib fractures. CT ABDOMEN PELVIS FINDINGS Hepatobiliary: No hepatic lesions are identified. Prior small lesion at the right hepatic dome is no longer identified. No new hepatic lesions. No intrahepatic biliary dilatation. The gallbladder is unremarkable. No common bile duct dilatation. Pancreas: No mass, inflammation or ductal dilatation. Spleen: Normal size. No splenic lesions. Adrenals/Urinary Tract: Adrenal glands and kidneys are unremarkable. No worrisome renal lesions or hydronephrosis. Mild uniform bladder wall thickening likely due to lack of distension. Stomach/Bowel: The stomach, duodenum, small bowel and colon are grossly normal. No acute inflammatory changes, mass lesions or obstructive findings. Stable surgical changes from a partial right colectomy. No findings suspicious for recurrent tumor. Stable severe sigmoid colon diverticulosis. Vascular/Lymphatic: Significant age advanced atherosclerotic calcifications involving the aorta and iliac arteries but no aneurysm or dissection. The major venous structures are patent. Stable fairly extensive perisplenic and perigastric collateral vessels. Reproductive: The prostate gland seminal vesicles are. Other: No ascites or abdominal wall hernia. Musculoskeletal: No significant bony findings. Stable advanced degenerative disc disease at L5-S1. IMPRESSION: 1. Stable changes of radiation fibrosis involving the left hilum and left paramediastinal lung. No findings suspicious for recurrent tumor. 2. No findings for metastatic disease involving the chest. 3. Stable surgical changes from a partial right colectomy. No findings suspicious for recurrent tumor or abdominal metastatic disease. Previous small right hepatic lobe lesion is not identified for certain on  today's exam. 4. Stable age advanced atherosclerotic calcifications involving the thoracic and abdominal aorta and branch vessels including age advanced three-vessel coronary artery calcifications. 5. Stable extensive perisplenic and perigastric collateral vessels. 6. Stable severe sigmoid colon diverticulosis. Aortic Atherosclerosis (ICD10-I70.0) and Emphysema (ICD10-J43.9). Electronically Signed   By: Marijo Sanes M.D.   On: 03/11/2021 17:27    ASSESSMENT & PLAN:   Cancer of ascending colon ([place]) # Right colon cancer stage IV- liver metastases; adenocarcinoma; most recently on 5FU + avastin-chemotherapy currently on hold -because of mild to moderate intolerance/stability disease/patient preference/   # SEP 17th, 2022-CT scan chest and pelvis/noncontrast-  No evidence of progressive metastatic disease in the chest,abdomen or pelvis on noncontrast imaging. Left perihilar treatment changes no evidence of local recurrence- STABLE. Continue to monitor for now/continue chemo holiday. Will order CT scan-at next visit/scan every 4 months.  # LEFT hilar/LUL-  Limited stage small cell cancer-s/p chemo-RT; SEP  2022-CT chest-  Stable.   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   #Hyponatremia sodium 131- over all   STABLE.-  ? Etiology; limit 1.5 lit free water stable  # Back pain- MRI lumbar spine- July 2021-NEG for mets; severe arthritic changes; spinal canal stenosis-stable hydrocodone 1 day prn -STABLE.   # Poorly controlled diabetes blood sugars-better controlled as per patients-overall stable FBG-226 discussed re: dietary changes.   # Syncopal episodes/ discussed re: carotid dopplers-discussed risk of stroke.  Patient verbalized understanding however declines any further work-up at this time.  PVD [Dr.Schnier]- on plavix; AUU 2022- declines intervention. Again reviewed the importance of close follow-up with vascular/recommendations.   # HTN- 120s/ 70-OFF[[ Avastin]-stable  continue  current anti-HTNs.   # DISPOSITION:  # follow up in 2 months -MD; labs-cbc/cmp/cea; NO chemo; port flush; --Dr.B  # I reviewed the blood work- with the patient in detail; also reviewed the imaging independently [as summarized above]; and with the patient in detail.     All questions were answered. The patient knows to call the clinic with any problems, questions or concerns.    Cammie Sickle, MD 03/16/2021 10:45 AM

## 2021-03-17 LAB — CEA: CEA: 3.9 ng/mL (ref 0.0–4.7)

## 2021-03-22 ENCOUNTER — Encounter: Payer: Self-pay | Admitting: Internal Medicine

## 2021-04-11 ENCOUNTER — Encounter: Payer: Self-pay | Admitting: General Surgery

## 2021-04-24 ENCOUNTER — Other Ambulatory Visit (INDEPENDENT_AMBULATORY_CARE_PROVIDER_SITE_OTHER): Payer: Self-pay | Admitting: Nurse Practitioner

## 2021-04-24 DIAGNOSIS — I70221 Atherosclerosis of native arteries of extremities with rest pain, right leg: Secondary | ICD-10-CM

## 2021-04-26 ENCOUNTER — Ambulatory Visit (INDEPENDENT_AMBULATORY_CARE_PROVIDER_SITE_OTHER): Payer: BC Managed Care – PPO | Admitting: Nurse Practitioner

## 2021-04-26 ENCOUNTER — Encounter (INDEPENDENT_AMBULATORY_CARE_PROVIDER_SITE_OTHER): Payer: Self-pay | Admitting: Nurse Practitioner

## 2021-04-26 ENCOUNTER — Ambulatory Visit (INDEPENDENT_AMBULATORY_CARE_PROVIDER_SITE_OTHER): Payer: BC Managed Care – PPO

## 2021-04-26 ENCOUNTER — Other Ambulatory Visit: Payer: Self-pay

## 2021-04-26 VITALS — BP 145/76 | HR 74 | Ht 68.0 in | Wt 139.0 lb

## 2021-04-26 DIAGNOSIS — Z720 Tobacco use: Secondary | ICD-10-CM | POA: Diagnosis not present

## 2021-04-26 DIAGNOSIS — E1140 Type 2 diabetes mellitus with diabetic neuropathy, unspecified: Secondary | ICD-10-CM

## 2021-04-26 DIAGNOSIS — I70221 Atherosclerosis of native arteries of extremities with rest pain, right leg: Secondary | ICD-10-CM | POA: Diagnosis not present

## 2021-04-26 DIAGNOSIS — I10 Essential (primary) hypertension: Secondary | ICD-10-CM

## 2021-04-26 DIAGNOSIS — Z794 Long term (current) use of insulin: Secondary | ICD-10-CM

## 2021-05-02 IMAGING — CT NUCLEAR MEDICINE PET IMAGE INITIAL (PI) SKULL BASE TO THIGH
1 of 10 series · 3 of 16 positions shown, 4 images · non-contrast
Comparison: 11/26/2018 screening chest CT. 04/10/2013 CT
abdomen/pelvis.

CLINICAL DATA: Initial treatment strategy for AP window adenopathy
seen on recent screening chest CT study.

EXAM:
NUCLEAR MEDICINE PET SKULL BASE TO THIGH
TECHNIQUE: 7.7 mCi F-18 FDG was injected intravenously. Full-ring PET imaging
was performed from the skull base to thigh after the radiotracer. CT
data was obtained and used for attenuation correction and anatomic
localization.
Fasting blood glucose: 121 mg/dl

[Series 3: ct wb 5.0 b30f · axial · 0.98mm/px · z∈[-1058,-192]mm · 3 of 290 slices shown, 4 images]
[im 1/290  soft-tissue]
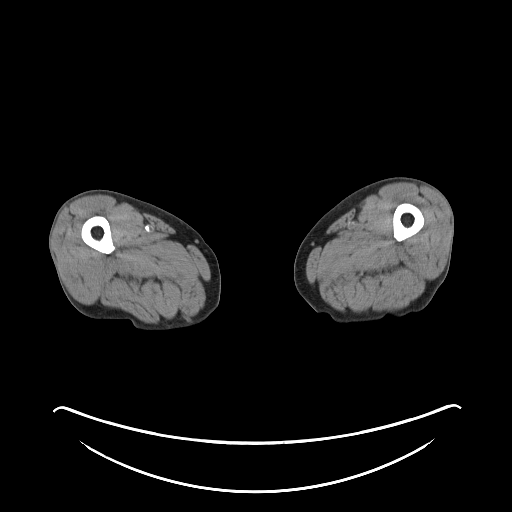
[im 1/290  bone]
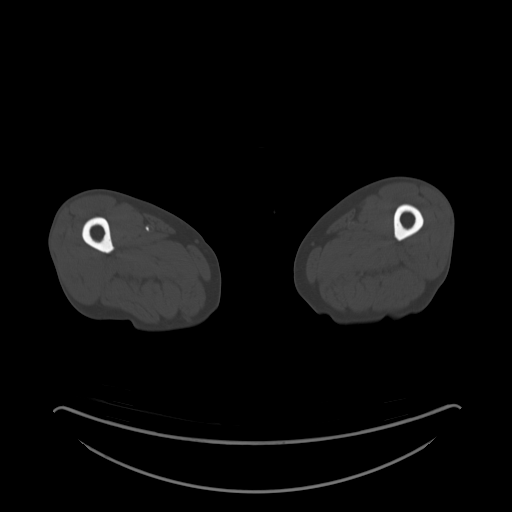
[im 145/290  soft-tissue]
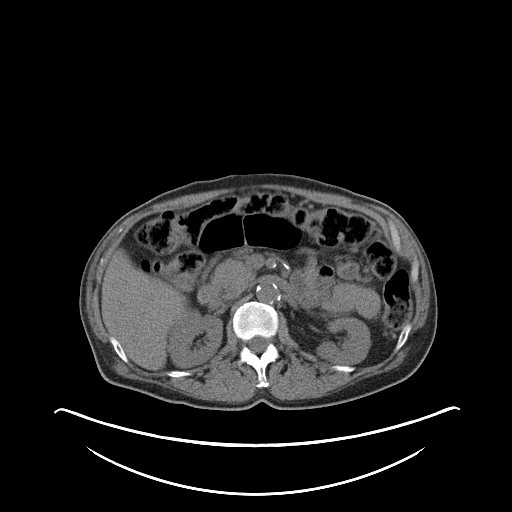
[im 290/290  soft-tissue]
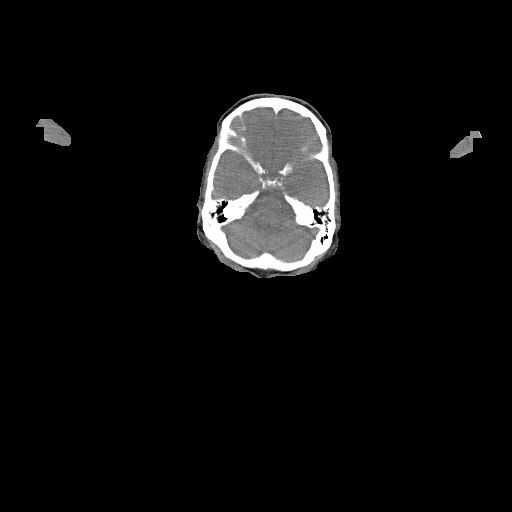

[3 of 16 positions shown; findings below may reference images not displayed]

FINDINGS: Mediastinal blood pool activity: SUV max

Liver activity: SUV max NA

NECK: No hypermetabolic lymph nodes in the neck.

Incidental CT findings: Mucoperiosteal thickening in bilateral
maxillary sinuses with mucous retention cyst versus polyp in
inferior right maxillary sinus.

CHEST:

There is a hypermetabolic 3.1 x 2.4 cm AP window soft tissue mass
with max SUV 7.0 (series 3/image 73). Otherwise no hypermetabolic
mediastinal nodes. No hypermetabolic axillary or hilar adenopathy.
No hypermetabolic pulmonary findings.

Incidental CT findings: Coronary atherosclerosis. Atherosclerotic
nonaneurysmal thoracic aorta. Mild centrilobular emphysema with
diffuse bronchial wall thickening. No acute consolidative airspace
disease, lung masses or significant pulmonary nodules.

ABDOMEN/PELVIS: No abnormal hypermetabolic activity within the
liver, pancreas, adrenal glands, or spleen. No hypermetabolic lymph
nodes in the abdomen or pelvis.

Incidental CT findings: Atherosclerotic nonaneurysmal abdominal
aorta. Apparent absence of body and tail of the pancreas. Marked
sigmoid diverticulosis. Top-normal size prostate.

SKELETON: No focal hypermetabolic activity to suggest skeletal
metastasis.

Incidental CT findings: none
IMPRESSION: 1. Hypermetabolic (max SUV 7.0) AP window 3.1 x 2.4 cm soft tissue
mass, compatible with malignancy, such as due to small cell lung
carcinoma or less commonly a thymic malignancy in this location.
Thoracic surgical consultation advised.
2. No hypermetabolic pulmonary findings. No additional
hypermetabolic thoracic lymph nodes. No hypermetabolic extrathoracic
or skeletal metastatic disease.
3. Chronic findings include: Aortic Atherosclerosis (FZ3AS-HWL.L)
and Emphysema (FZ3AS-ZHE.3). Coronary atherosclerosis. Chronic
paranasal sinusitis. Marked sigmoid diverticulosis.

## 2021-05-07 ENCOUNTER — Encounter (INDEPENDENT_AMBULATORY_CARE_PROVIDER_SITE_OTHER): Payer: Self-pay | Admitting: Nurse Practitioner

## 2021-05-07 NOTE — Progress Notes (Signed)
Subjective:    Patient ID: Kevin Mckay, male    DOB: [birth year], 70 y.o.   MRN: [id number] Chief Complaint  Patient presents with   Follow-up    FU 11mo  [place]    Kevin Mckay is a 70 year old male that presents today for follow-up evaluation of peripheral arterial disease.  The patient notes that he still continues to have claudication but this somewhat improved since starting his Plavix.  The patient has been taking Plavix without issue.  He notes that the claudication is not interfering with his daily activities.  He denies rest pain.  He denies any wounds or ulcerations.  Today the right ABI 0.67 with a TBI 0.62.  Previously the ABI was 0.71 on the right.  The left ABI 0.90 with a TBI 0.67 previously the ABI was 0.98.  The right lower extremity has monophasic/biphasic waveforms with monophasic waveforms in the left.  Patient has dampened toe waveforms bilaterally.   Review of Systems  Cardiovascular:        Claudication  All other systems reviewed and are negative.     Objective:   Physical Exam Vitals reviewed.  HENT:     Head: Normocephalic.  Cardiovascular:     Rate and Rhythm: Normal rate.     Pulses:          Dorsalis pedis pulses are detected w/ Doppler on the right side and detected w/ Doppler on the left side.       Posterior tibial pulses are detected w/ Doppler on the right side and detected w/ Doppler on the left side.  Pulmonary:     Effort: Pulmonary effort is normal.  Skin:    General: Skin is warm and dry.  Neurological:     Mental Status: He is alert and oriented to person, place, and time.  Psychiatric:        Mood and Affect: Mood normal.        Behavior: Behavior normal.        Thought Content: Thought content normal.        Judgment: Judgment normal.    BP (!) 145/76   Pulse 74   Ht 5\' 8"  (1.727 m)   Wt 139 lb (63 kg)   BMI 21.13 kg/m   Past Medical History:  Diagnosis Date   Asthma    Cancer ([place])    Metastatic small cell lung cancer    Cerebral aneurysm    Chronic painful diabetic neuropathy ([facility])    Depression    Diabetes mellitus without complication ([place])    Essential hypertension    History of kidney stones    Occasional tremors    Tobacco use     Social History   Socioeconomic History   Marital status: Married    Spouse name: Not on file   Number of children: Not on file   Years of education: Not on file   Highest education level: Not on file  Occupational History   Not on file  Tobacco Use   Smoking status: Every Day    Packs/day: 0.50    Years: 50.00    Pack years: 25.00    Types: Cigarettes   Smokeless tobacco: Never  Vaping Use   Vaping Use: Every day   Substances: Nicotine  Substance and Sexual Activity   Alcohol use: No   Drug use: Yes    Types: Barbituates, Marijuana   Sexual activity: Not on file  Other Topics Concern   Not on file  Social History Narrative   Lives in [place]; wife/ son/grandson [custody]; smoker; vending business; hx of alcoholism- quit at 38.    Social Determinants of Health   Financial Resource Strain: Not on file  Food Insecurity: Not on file  Transportation Needs: Not on file  Physical Activity: Not on file  Stress: Not on file  Social Connections: Not on file  Intimate Partner Violence: Not on file    Past Surgical History:  Procedure Laterality Date   ABDOMINAL SURGERY     age 48. trauma surgery due to forklift injury- liver and spleen   COLONOSCOPY WITH PROPOFOL N/A 03/27/2019   Procedure: COLONOSCOPY WITH PROPOFOL;  Surgeon: Jonathon Bellows, MD;  Location: [facility] ENDOSCOPY;  Service: Gastroenterology;  Laterality: N/A;   COLOSTOMY REVISION Right 05/04/2019   Procedure: COLON RESECTION RIGHT-right hemicolectomy-open;  Surgeon: Fredirick Maudlin, MD;  Location: [facility] ORS;  Service: General;  Laterality: Right;   ESOPHAGOGASTRODUODENOSCOPY (EGD) WITH PROPOFOL N/A 03/27/2019   Procedure: ESOPHAGOGASTRODUODENOSCOPY (EGD) WITH PROPOFOL;  Surgeon: Jonathon Bellows,  MD;  Location: [facility] ENDOSCOPY;  Service: Gastroenterology;  Laterality: N/A;   IR GENERIC HISTORICAL  05/31/2016   IR ANGIO VERTEBRAL SEL VERTEBRAL BILAT MOD SED 05/31/2016 Consuella Lose, MD [facility]-INTER[facility] RAD   IR GENERIC HISTORICAL  05/31/2016   IR ANGIO INTRA EXTRACRAN SEL INTERNAL CAROTID BILAT MOD SED 05/31/2016 Consuella Lose, MD [facility]   PORTACATH PLACEMENT Right 02/04/2019   Procedure: INSERTION PORT-A-CATH;  Surgeon: [name], MD;  Location: [facility] ORS;  Service: General;  Laterality: Right;   THORACOTOMY Left 01/22/2019   Procedure: PRE OP BRONCH LEFT ANTERIOR THORACOTOMY WITH BIOPSY OF MEDIASTINAL MASS;  Surgeon: [name], MD;  Location: [facility] ORS;  Service: General;  Laterality: Left;    Family History  Problem Relation Age of Onset   Lymphoma Father    Liver cancer Paternal Uncle    Lung cancer Paternal Uncle    Lung cancer Maternal Uncle     Allergies  Allergen Reactions   Ferumoxytol Shortness Of Breath and Other (See Comments)    Infusion reaction-shortness of breath/ tachycardia/hypotension.   Amoxicillin Hives    Did it involve swelling of the face/tongue/throat, SOB, or low BP? No Did it involve sudden or severe rash/hives, skin peeling, or any reaction on the inside of your mouth or nose? Yes Did you need to seek medical attention at a hospital or doctor's office? No When did it last happen?~5 years ago If all above answers are "NO", may proceed with cephalosporin use.    Codeine Nausea Only    CBC Latest Ref Rng & Units 03/16/2021 02/16/2021 01/19/2021  WBC 4.0 - 10.5 K/uL 7.1 7.0 6.7  Hemoglobin 13.0 - 17.0 g/dL 11.0(L) 10.3(L) 10.7(L)  Hematocrit 39.0 - 52.0 % 33.2(L) 31.2(L) 31.8(L)  Platelets 150 - 400 K/uL 148(L) 160 137(L)      CMP     Component Value Date/Time   NA 133 (L) 03/16/2021 0940   K 4.6 03/16/2021 0940   CL 99 03/16/2021 0940   CO2 24 03/16/2021 0940   GLUCOSE 212 (H) 03/16/2021 0940   BUN 15 03/16/2021 0940   CREATININE  1.06 03/16/2021 0940   CALCIUM 9.1 03/16/2021 0940   PROT 6.9 03/16/2021 0940   ALBUMIN 3.7 03/16/2021 0940   AST 17 03/16/2021 0940   ALT 13 03/16/2021 0940   ALKPHOS 64 03/16/2021 0940   BILITOT 0.5 03/16/2021 0940   GFRNONAA >60 03/16/2021 0940   GFRAA >60 03/23/2020 0850     VAS  [place] ABI WITH/WO TBI  Result Date: 05/04/2021  LOWER EXTREMITY DOPPLER STUDY Patient Name:  ZAYQUAN BOGARD  Date of Exam:   04/26/2021 Medical Rec #: [id number]        Accession #:    [id number] Date of Birth: [birth year]       Patient Gender: M Patient Age:   51 years Exam Location:  [facility] Procedure:      VAS [place] ABI WITH/WO TBI Referring Phys: Eulogio Ditch --------------------------------------------------------------------------------  Indications: Claudication.  Comparison Study: 01/25/2021 Performing Technologist: Charlane Ferretti RT (R)(VS)  Examination Guidelines: A complete evaluation includes at minimum, Doppler waveform signals and systolic blood pressure reading at the level of bilateral brachial, anterior tibial, and posterior tibial arteries, when vessel segments are accessible. Bilateral testing is considered an integral part of a complete examination. Photoelectric Plethysmograph (PPG) waveforms and toe systolic pressure readings are included as required and additional duplex testing as needed. Limited examinations for reoccurring indications may be performed as noted.  ABI Findings: +---------+------------------+-----+----------+--------+ Right    Rt Pressure (mmHg)IndexWaveform  Comment  +---------+------------------+-----+----------+--------+ Brachial 159                                       +---------+------------------+-----+----------+--------+ ATA      107                    monophasic         +---------+------------------+-----+----------+--------+ PTA      104               0.65 biphasic           +---------+------------------+-----+----------+--------+ Great  Toe99                0.62 Abnormal           +---------+------------------+-----+----------+--------+ +---------+------------------+-----+----------+-------+ Left     Lt Pressure (mmHg)IndexWaveform  Comment +---------+------------------+-----+----------+-------+ Brachial 159                                      +---------+------------------+-----+----------+-------+ ATA      143                    monophasic        +---------+------------------+-----+----------+-------+ PTA      124               0.78 monophasic        +---------+------------------+-----+----------+-------+ Great Toe107               0.67 Abnormal          +---------+------------------+-----+----------+-------+ +-------+-----------+-----------+------------+------------+ ABI/TBIToday's ABIToday's TBIPrevious ABIPrevious TBI +-------+-----------+-----------+------------+------------+ Right  .67        .62        .71         .55          +-------+-----------+-----------+------------+------------+ Left   .90        .67        .98         .80          +-------+-----------+-----------+------------+------------+ Bilateral ABIs appear decreased compared to prior study on 01/25/2021.  Summary: Right: Resting right ankle-brachial index indicates moderate right lower extremity arterial disease. The right toe-brachial index is abnormal. Right TBI appears essentially unchanged as compared to the previous exam on  01/25/2021. Left: Resting left ankle-brachial index indicates mild left lower extremity arterial disease. The left toe-brachial index is abnormal. Left TBI appears decreased as compared to the previous exam on 01/25/2021.  *See table(s) above for measurements and observations.  Electronically signed by Hortencia Pilar MD on 05/04/2021 at 3:22:38 PM.    Final        Assessment & Plan:   1. 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  However at this time the patient does not wish to undergo intervention.  We discussed signs and symptoms of worsening possible limb threatening symptoms including consistent pain, worsening nightly pain, new wounds or ulcerations or discolorations.  Temperature changes are also of concern.  We will also place the patient on Plavix as a precautionary measure as well.  We will have the patient return to the office in 6 months to repeat noninvasive studies or sooner if issues arise  2. Tobacco use Smoking cessation was discussed, 3-10 minutes spent on this topic specifically   3. Essential hypertension Continue antihypertensive medications as already ordered, these medications have been reviewed and there are no changes at this time.   4. Type 2 diabetes mellitus with diabetic neuropathy, with long-term current use of insulin ([place]) Continue hypoglycemic medications as already ordered, these medications have been reviewed and there are no changes at this time.  Hgb A1C to be monitored as already arranged by primary service    Current Outpatient Medications on File Prior to Visit  Medication Sig Dispense Refill   amLODipine (NORVASC) 10 MG tablet Take 10 mg by mouth every morning.      budesonide-formoterol (SYMBICORT) 160-4.5 MCG/ACT inhaler Inhale 2 puffs into the lungs 2 (two) times daily.     clopidogrel (PLAVIX) 75 MG tablet Take 1 tablet (75 mg total) by mouth daily. 30 tablet 6   desonide (DESOWEN) 0.05 % cream Apply topically.     DULoxetine (CYMBALTA) 30 MG capsule Take 30 mg by mouth every morning.      gabapentin (NEURONTIN) 800 MG tablet Take 800 mg by mouth 3 (three) times daily.     HYDROcodone-acetaminophen (NORCO/VICODIN) 5-325 MG tablet Take 1 tablet by mouth every 6 (six) hours as needed for moderate pain. 45 tablet 0   insulin glargine (LANTUS) 100 UNIT/ML injection Inject into the skin daily. Units based By sliding scale      lidocaine-prilocaine (EMLA) cream Apply 1 application topically as needed. 30-45 mins prior to port access. 30 g 0   lisinopril-hydrochlorothiazide (ZESTORETIC) 10-12.5 MG tablet Take 1 tablet by mouth daily.     lovastatin (MEVACOR) 20 MG tablet Take 20 mg by mouth every evening.     metFORMIN (GLUCOPHAGE) 1000 MG tablet Take 1,000 mg by mouth 2 (two) times daily with a meal.      metFORMIN (GLUCOPHAGE-XR) 500 MG 24 hr tablet Take 1,000 mg by mouth 2 (two) times daily.     omeprazole (PRILOSEC) 40 MG capsule Take 1 capsule (40 mg total) by mouth daily. 90 capsule 1   ondansetron (ZOFRAN) 8 MG tablet Take 1 tablet (8 mg total) by mouth every 8 (eight) hours as needed for nausea or vomiting (start 3 days; after chemo). 40 tablet 1   polyethylene glycol powder (MIRALAX) 17 GM/SCOOP powder Mix full container in 64 ounces of Gatorade or other clear liquid. NO RED Liquids 238 g 0   prazosin (  MINIPRESS) 1 MG capsule Take 1 mg by mouth at bedtime.     pyridOXINE (VITAMIN B-6) 100 MG tablet Take 100 mg by mouth daily.     sitaGLIPtin (JANUVIA) 25 MG tablet Take 25 mg by mouth daily.     sodium chloride 1 g tablet Take by mouth.     Ipratropium-Albuterol (COMBIVENT) 20-100 MCG/ACT AERS respimat Inhale into the lungs.     Current Facility-Administered Medications on File Prior to Visit  Medication Dose Route Frequency Provider Last Rate Last Admin   heparin lock flush 100 UNIT/ML injection            sodium chloride flush (NS) 0.9 % injection 10 mL  10 mL Intravenous PRN Cammie Sickle, MD   10 mL at 09/13/20 0943    There are no Patient Instructions on file for this visit. No follow-ups on file.   Kris Hartmann, NP

## 2021-05-11 ENCOUNTER — Other Ambulatory Visit: Payer: Self-pay

## 2021-05-11 ENCOUNTER — Inpatient Hospital Stay: Payer: BC Managed Care – PPO | Admitting: Internal Medicine

## 2021-05-11 ENCOUNTER — Encounter: Payer: Self-pay | Admitting: Internal Medicine

## 2021-05-11 ENCOUNTER — Inpatient Hospital Stay: Payer: BC Managed Care – PPO | Attending: Internal Medicine

## 2021-05-11 VITALS — BP 145/76 | HR 79 | Temp 98.1°F | Wt 139.2 lb

## 2021-05-11 DIAGNOSIS — Z8616 Personal history of COVID-19: Secondary | ICD-10-CM | POA: Diagnosis not present

## 2021-05-11 DIAGNOSIS — C801 Malignant (primary) neoplasm, unspecified: Secondary | ICD-10-CM | POA: Diagnosis not present

## 2021-05-11 DIAGNOSIS — J449 Chronic obstructive pulmonary disease, unspecified: Secondary | ICD-10-CM | POA: Diagnosis not present

## 2021-05-11 DIAGNOSIS — F32A Depression, unspecified: Secondary | ICD-10-CM | POA: Insufficient documentation

## 2021-05-11 DIAGNOSIS — G893 Neoplasm related pain (acute) (chronic): Secondary | ICD-10-CM | POA: Diagnosis not present

## 2021-05-11 DIAGNOSIS — D6481 Anemia due to antineoplastic chemotherapy: Secondary | ICD-10-CM | POA: Diagnosis not present

## 2021-05-11 DIAGNOSIS — I739 Peripheral vascular disease, unspecified: Secondary | ICD-10-CM | POA: Diagnosis not present

## 2021-05-11 DIAGNOSIS — M4800 Spinal stenosis, site unspecified: Secondary | ICD-10-CM | POA: Diagnosis not present

## 2021-05-11 DIAGNOSIS — C182 Malignant neoplasm of ascending colon: Secondary | ICD-10-CM | POA: Insufficient documentation

## 2021-05-11 DIAGNOSIS — Z7901 Long term (current) use of anticoagulants: Secondary | ICD-10-CM | POA: Insufficient documentation

## 2021-05-11 DIAGNOSIS — Z9221 Personal history of antineoplastic chemotherapy: Secondary | ICD-10-CM | POA: Diagnosis not present

## 2021-05-11 DIAGNOSIS — I10 Essential (primary) hypertension: Secondary | ICD-10-CM | POA: Insufficient documentation

## 2021-05-11 DIAGNOSIS — C787 Secondary malignant neoplasm of liver and intrahepatic bile duct: Secondary | ICD-10-CM | POA: Insufficient documentation

## 2021-05-11 DIAGNOSIS — E1142 Type 2 diabetes mellitus with diabetic polyneuropathy: Secondary | ICD-10-CM | POA: Diagnosis not present

## 2021-05-11 DIAGNOSIS — Z923 Personal history of irradiation: Secondary | ICD-10-CM | POA: Diagnosis not present

## 2021-05-11 DIAGNOSIS — Z794 Long term (current) use of insulin: Secondary | ICD-10-CM | POA: Diagnosis not present

## 2021-05-11 DIAGNOSIS — M479 Spondylosis, unspecified: Secondary | ICD-10-CM | POA: Diagnosis not present

## 2021-05-11 DIAGNOSIS — Z7984 Long term (current) use of oral hypoglycemic drugs: Secondary | ICD-10-CM | POA: Diagnosis not present

## 2021-05-11 DIAGNOSIS — F17210 Nicotine dependence, cigarettes, uncomplicated: Secondary | ICD-10-CM | POA: Insufficient documentation

## 2021-05-11 DIAGNOSIS — Z95828 Presence of other vascular implants and grafts: Secondary | ICD-10-CM

## 2021-05-11 DIAGNOSIS — Z79899 Other long term (current) drug therapy: Secondary | ICD-10-CM | POA: Insufficient documentation

## 2021-05-11 DIAGNOSIS — C781 Secondary malignant neoplasm of mediastinum: Secondary | ICD-10-CM | POA: Insufficient documentation

## 2021-05-11 DIAGNOSIS — C3412 Malignant neoplasm of upper lobe, left bronchus or lung: Secondary | ICD-10-CM | POA: Insufficient documentation

## 2021-05-11 LAB — CBC WITH DIFFERENTIAL/PLATELET
Abs Immature Granulocytes: 0.03 10*3/uL (ref 0.00–0.07)
Basophils Absolute: 0.1 10*3/uL (ref 0.0–0.1)
Basophils Relative: 1 %
Eosinophils Absolute: 0.5 10*3/uL (ref 0.0–0.5)
Eosinophils Relative: 7 %
HCT: 35.4 % — ABNORMAL LOW (ref 39.0–52.0)
Hemoglobin: 11.8 g/dL — ABNORMAL LOW (ref 13.0–17.0)
Immature Granulocytes: 0 %
Lymphocytes Relative: 33 %
Lymphs Abs: 2.5 10*3/uL (ref 0.7–4.0)
MCH: 27.9 pg (ref 26.0–34.0)
MCHC: 33.3 g/dL (ref 30.0–36.0)
MCV: 83.7 fL (ref 80.0–100.0)
Monocytes Absolute: 0.6 10*3/uL (ref 0.1–1.0)
Monocytes Relative: 8 %
Neutro Abs: 3.8 10*3/uL (ref 1.7–7.7)
Neutrophils Relative %: 51 %
Platelets: 172 10*3/uL (ref 150–400)
RBC: 4.23 MIL/uL (ref 4.22–5.81)
RDW: 14.6 % (ref 11.5–15.5)
WBC: 7.5 10*3/uL (ref 4.0–10.5)
nRBC: 0 % (ref 0.0–0.2)

## 2021-05-11 LAB — COMPREHENSIVE METABOLIC PANEL
ALT: 15 U/L (ref 0–44)
AST: 20 U/L (ref 15–41)
Albumin: 3.8 g/dL (ref 3.5–5.0)
Alkaline Phosphatase: 62 U/L (ref 38–126)
Anion gap: 11 (ref 5–15)
BUN: 18 mg/dL (ref 8–23)
CO2: 25 mmol/L (ref 22–32)
Calcium: 8.8 mg/dL — ABNORMAL LOW (ref 8.9–10.3)
Chloride: 99 mmol/L (ref 98–111)
Creatinine, Ser: 1.05 mg/dL (ref 0.61–1.24)
GFR, Estimated: >60 mL/min (ref >60)
Glucose, Bld: 166 mg/dL — ABNORMAL HIGH (ref 70–99)
Potassium: 4.5 mmol/L (ref 3.5–5.1)
Sodium: 135 mmol/L (ref 135–145)
Total Bilirubin: 0.2 mg/dL — ABNORMAL LOW (ref 0.3–1.2)
Total Protein: 7.2 g/dL (ref 6.5–8.1)

## 2021-05-11 MED ORDER — HEPARIN SOD (PORK) LOCK FLUSH 100 UNIT/ML IV SOLN
500.0000 [IU] | Freq: Once | INTRAVENOUS | Status: AC
  Administered 2021-05-11: 10:00:00 500 [IU] via INTRAVENOUS
  Filled 2021-05-11: qty 5

## 2021-05-11 MED ORDER — HYDROCODONE-ACETAMINOPHEN 5-325 MG PO TABS: 1.0000 | ORAL_TABLET | Freq: Two times a day (BID) | ORAL | 0 refills | Status: DC | PRN

## 2021-05-11 MED ORDER — SODIUM CHLORIDE 0.9% FLUSH
10.0000 mL | INTRAVENOUS | Status: DC | PRN
  Administered 2021-05-11: 10:00:00 10 mL via INTRAVENOUS
  Filled 2021-05-11: qty 10

## 2021-05-11 NOTE — Progress Notes (Signed)
Pt having back pain and needs refill

## 2021-05-11 NOTE — Assessment & Plan Note (Addendum)
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 SEP 17th, 2022-CT scan chest and pelvis/noncontrast-  No evidence of progressive metastatic disease in the chest,abdomen or pelvis on noncontrast imaging. Left perihilar treatment changes no evidence of local recurrence-STABLE.  # Continue to monitor for now/continue chemo holiday. Will order CT scan- today for Jan 2023.  # LEFT hilar/LUL-  Limited stage small cell cancer-s/p chemo-RT; SEP  2022-CT chest- STABLE; will order scan today.   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   #Hyponatremia sodium 135- STABLE.   # Back pain- MRI lumbar spine- July 2021-NEG for mets; severe arthritic changes; spinal canal stenosis-stable hydrocodone 1 day prn -STABLE; refilled.  # Poorly controlled diabetes blood sugars-better controlled as per patients-overall stable FBG-166- STABLE.   # HTN- 120s/ 70-OFF[[ Avastin]-stable continue current anti-HTNs.   # DISPOSITION:  # follow up in 2 months -MD; labs-cbc/cmp/cea; NO chemo; port flush; CT CAP proir --Dr.B

## 2021-05-11 NOTE — Progress Notes (Signed)
[place] NOTE  Patient Care Team: Laneta Simmers, NP (Inactive) as PCP - General (Nurse Practitioner) Telford Nab, RN as Registered Nurse Clent Jacks, RN as Oncology Nurse Navigator  CHIEF COMPLAINTS/PURPOSE OF CONSULTATION: Lung cancer/colon cancer   Oncology History Overview Note  # July 2020- SMALL CELL CA METASTATIC TO MEDIASTINAL LN [open Bx; Dr.Oaks]; TxN2M0; July 2nd 2020-PET- 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  MRI brain negative  # aug 17th 2020-carbo etoposide-RT [? 8/19]; #4 cycle carbo-Etop [finished dec 30th,2020]  #August 2020 iron deficient anemia-question etiology; IV Feraheme [no colonoscopy]  # OCT 2020- colo/ Cecal adeno ca; MRI liver- 1 cm enhancing lesion "metastasis" [Dr.Anna/Dr.Cannon]-difficulty biopsy.  November 02-2019 right hemicolectomy- STAGE III [pT3pN1 (2/13LN)]- HOLD adjuvant therapy. MMR- INTACT/LOW  #May 2021-liver biopsy-possible adenocarcinoma; colorectal origin.  Stage IV colon cancer  #May 24-2021-FOLFOX with Avastin; AUG 31st,2021- STABLE liver lesion; SEP 1st, 2021- Discontinue LV+Ox sec to severe fatigue/PN; cont 5FU CIV + Avastin  # COPD/  DM-2- on OHA/ smoker/ PVD/peripheral neuropathy  # History of alcohol abuse/quit 2014/ Hx of abdominal trauma [at 21y]  #July 2021-foundation 1 NGS [liver metastases]- RAS WILD TYPE **  # Jan 1st, 2022-Covid infection [prior vaccination; monoclonal infusion-not hospitalized]  # DIAGNOSIS:   # SMALL CELL CA-limited stage-status post chemoradiation;   # colon cancer-stage IV-solitary liver lesion  GOALS: Control  CURRENT/MOST RECENT THERAPY : FOLFOX with Avastin   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 ([place])  01/29/2019 Initial Diagnosis   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 [facility])   02/09/2019 - 06/24/2019 Chemotherapy   The patient had palonosetron (ALOXI)  injection 0.25 mg, 0.25 mg, Intravenous,  Once, 4 of 4 cycles Administration: 0.25 mg (02/09/2019), 0.25 mg (03/03/2019), 0.25 mg (06/02/2019), 0.25 mg (06/22/2019) CARBOplatin (PARAPLATIN) 410 mg in sodium chloride 0.9 % 250 mL chemo infusion, 410 mg (100 % of original dose 414 mg), Intravenous,  Once, 4 of 4 cycles Dose modification:   (original dose 414 mg, Cycle 1) Administration: 410 mg (02/09/2019), 410 mg (03/03/2019), 410 mg (06/02/2019), 410 mg (06/22/2019) etoposide (VEPESID) 180 mg in sodium chloride 0.9 % 500 mL chemo infusion, 100 mg/m2 = 180 mg, Intravenous,  Once, 4 of 4 cycles Administration: 180 mg (02/09/2019), 180 mg (02/10/2019), 180 mg (02/11/2019), 180 mg (03/03/2019), 180 mg (03/04/2019), 180 mg (03/05/2019), 180 mg (06/02/2019), 180 mg (06/03/2019), 180 mg (06/04/2019), 180 mg (06/22/2019), 180 mg (06/23/2019), 180 mg (06/24/2019) fosaprepitant (EMEND) 150 mg, dexamethasone (DECADRON) 6 mg in sodium chloride 0.9 % 145 mL IVPB, , Intravenous,  Once, 2 of 2 cycles Administration:  (06/02/2019),  (06/22/2019)   for chemotherapy treatment.     Cancer of ascending colon ([place])  05/04/2019 Initial Diagnosis   Cancer of ascending colon ([place])   11/30/2019 -  Chemotherapy    Patient is on Treatment Plan: COLORECTAL FOLFOX + BEVACIZUMAB Q14D        HISTORY OF PRESENTING ILLNESS: Alone.  Ambulating independently.  Brook Petruzzi 70 y.o.  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  Patient denies any syncopal episodes.  Denies any new shortness of breath or cough.  No headaches.  No nausea vomiting.  Complains of ongoing back pain for which he takes hydrocodone 1 pill every day at the most.  Review of Systems  Constitutional:  Positive for malaise/fatigue and weight loss. Negative for chills, diaphoresis and fever.   HENT:  Negative for nosebleeds and sore throat.   Eyes:  Negative for double vision.  Respiratory:  Negative for hemoptysis and wheezing.   Cardiovascular:  Negative for chest pain, palpitations and orthopnea.  Gastrointestinal:  Negative for abdominal pain, blood in stool, constipation, diarrhea, heartburn, melena, nausea and vomiting.  Genitourinary:  Negative for dysuria, frequency and urgency.  Musculoskeletal:  Positive for back pain and joint pain.  Skin: Negative.  Negative for itching and rash.  Neurological:  Positive for tingling. Negative for dizziness, focal weakness and weakness.  Endo/Heme/Allergies:  Does not bruise/bleed easily.  Psychiatric/Behavioral:  Negative for depression. The patient is not nervous/anxious and does not have insomnia.     MEDICAL HISTORY:  Past Medical History:  Diagnosis Date   Asthma    Cancer ([place])    Metastatic small cell lung cancer   Cerebral aneurysm    Chronic painful diabetic neuropathy (HCC)    Depression    Diabetes mellitus without complication ([place])    Essential hypertension    History of kidney stones    Occasional tremors    Tobacco use     SURGICAL HISTORY: Past Surgical History:  Procedure Laterality Date   ABDOMINAL SURGERY     age 47. trauma surgery due to forklift injury- liver and spleen   COLONOSCOPY WITH PROPOFOL N/A 03/27/2019   Procedure: COLONOSCOPY WITH PROPOFOL;  Surgeon: Jonathon Bellows, MD;  Location: [facility] ENDOSCOPY;  Service: Gastroenterology;  Laterality: N/A;   COLOSTOMY REVISION Right 05/04/2019   Procedure: COLON RESECTION RIGHT-right hemicolectomy-open;  Surgeon: Fredirick Maudlin, MD;  Location: [facility] ORS;  Service: General;  Laterality: Right;   ESOPHAGOGASTRODUODENOSCOPY (EGD) WITH PROPOFOL N/A 03/27/2019   Procedure: ESOPHAGOGASTRODUODENOSCOPY (EGD) WITH PROPOFOL;  Surgeon: Jonathon Bellows, MD;  Location: [facility] ENDOSCOPY;  Service: Gastroenterology;  Laterality: N/A;   IR GENERIC HISTORICAL  05/31/2016   IR  ANGIO VERTEBRAL SEL VERTEBRAL BILAT MOD SED 05/31/2016 Consuella Lose, MD [facility]-INTERV RAD   IR GENERIC HISTORICAL  05/31/2016   IR ANGIO INTRA EXTRACRAN SEL INTERNAL CAROTID BILAT MOD SED 05/31/2016 Consuella Lose, MD [facility]   PORTACATH PLACEMENT Right 02/04/2019   Procedure: INSERTION PORT-A-CATH;  Surgeon: [name], MD;  Location: [facility] ORS;  Service: General;  Laterality: Right;   THORACOTOMY Left 01/22/2019   Procedure: PRE OP BRONCH LEFT ANTERIOR THORACOTOMY WITH BIOPSY OF MEDIASTINAL MASS;  Surgeon: [name], MD;  Location: [facility] ORS;  Service: General;  Laterality: Left;    SOCIAL HISTORY: Social History   Socioeconomic History   Marital status: Married    Spouse name: Not on file   Number of children: Not on file   Years of education: Not on file   Highest education level: Not on file  Occupational History   Not on file  Tobacco Use   Smoking status: Every Day    Packs/day: 0.50    Years: 50.00    Pack years: 25.00    Types: Cigarettes   Smokeless tobacco: Never  Vaping Use   Vaping Use: Never used  Substance and Sexual Activity   Alcohol use: No   Drug use: Yes    Types: Barbituates, Marijuana   Sexual activity: Not Currently  Other Topics Concern   Not on file  Social History Narrative   Lives in [place]; wife/ son/grandson [custody]; smoker; vending business; hx of alcoholism- quit at 12.    Social Determinants of Health  Financial Resource Strain: Not on file  Food Insecurity: Not on file  Transportation Needs: Not on file  Physical Activity: Not on file  Stress: Not on file  Social Connections: Not on file  Intimate Partner Violence: Not on file    FAMILY HISTORY: Family History  Problem Relation Age of Onset   Lymphoma Father    Liver cancer Paternal Uncle    Lung cancer Paternal Uncle    Lung cancer Maternal Uncle     ALLERGIES:  is allergic to ferumoxytol, amoxicillin, and codeine.  MEDICATIONS:  Current Outpatient  Medications  Medication Sig Dispense Refill   amLODipine (NORVASC) 10 MG tablet Take 10 mg by mouth every morning.      budesonide-formoterol (SYMBICORT) 160-4.5 MCG/ACT inhaler Inhale 2 puffs into the lungs 2 (two) times daily.     clopidogrel (PLAVIX) 75 MG tablet Take 1 tablet (75 mg total) by mouth daily. 30 tablet 6   desonide (DESOWEN) 0.05 % cream Apply 1 application topically 2 (two) times daily as needed.     DULoxetine (CYMBALTA) 30 MG capsule Take 30 mg by mouth every morning.      gabapentin (NEURONTIN) 800 MG tablet Take 800 mg by mouth 3 (three) times daily.     insulin glargine (LANTUS) 100 UNIT/ML injection Inject into the skin daily. Units based By sliding scale     Ipratropium-Albuterol (COMBIVENT) 20-100 MCG/ACT AERS respimat Inhale 1 puff into the lungs every 6 (six) hours as needed.     lidocaine-prilocaine (EMLA) cream Apply 1 application topically as needed. 30-45 mins prior to port access. 30 g 0   lisinopril-hydrochlorothiazide (ZESTORETIC) 10-12.5 MG tablet Take 1 tablet by mouth daily.     lovastatin (MEVACOR) 20 MG tablet Take 20 mg by mouth every evening.     metFORMIN (GLUCOPHAGE) 1000 MG tablet Take 1,000 mg by mouth 2 (two) times daily with a meal.      omeprazole (PRILOSEC) 40 MG capsule Take 1 capsule (40 mg total) by mouth daily. 90 capsule 1   polyethylene glycol powder (MIRALAX) 17 GM/SCOOP powder Mix full container in 64 ounces of Gatorade or other clear liquid. NO RED Liquids 238 g 0   prazosin (MINIPRESS) 1 MG capsule Take 1 mg by mouth at bedtime.     pyridOXINE (VITAMIN B-6) 100 MG tablet Take 100 mg by mouth daily.     sitaGLIPtin (JANUVIA) 25 MG tablet Take 25 mg by mouth daily.     sodium chloride 1 g tablet 1 g daily.     HYDROcodone-acetaminophen (NORCO/VICODIN) 5-325 MG tablet Take 1 tablet by mouth every 12 (twelve) hours as needed for moderate pain. 45 tablet 0   ondansetron (ZOFRAN) 8 MG tablet Take 1 tablet (8 mg total) by mouth every 8 (eight)  hours as needed for nausea or vomiting (start 3 days; after chemo). (Patient not taking: Reported on 05/11/2021) 40 tablet 1   No current facility-administered medications for this visit.   Facility-Administered Medications Ordered in Other Visits  Medication Dose Route Frequency Provider Last Rate Last Admin   heparin lock flush 100 UNIT/ML injection            sodium chloride flush (NS) 0.9 % injection 10 mL  10 mL Intravenous PRN Cammie Sickle, MD   10 mL at 09/13/20 0943      .  PHYSICAL EXAMINATION: ECOG PERFORMANCE STATUS: 0 - Asymptomatic  Vitals:   05/11/21 1048  BP: (!) 145/76  Pulse: 79  Temp: 98.1  F (36.7 C)   Filed Weights   05/11/21 1048  Weight: 139 lb 3.2 oz (63.1 kg)    Physical Exam HENT:     Head: Normocephalic and atraumatic.     Mouth/Throat:     Pharynx: No oropharyngeal exudate.  Eyes:     Pupils: Pupils are equal, round, and reactive to light.  Cardiovascular:     Rate and Rhythm: Normal rate and regular rhythm.  Pulmonary:     Effort: No respiratory distress.     Breath sounds: No wheezing.  Abdominal:     General: Bowel sounds are normal. There is no distension.     Palpations: Abdomen is soft. There is no mass.     Tenderness: There is no abdominal tenderness. There is no guarding or rebound.     Comments: Abdominal incision well-healed.  Musculoskeletal:        General: No tenderness. Normal range of motion.     Cervical back: Normal range of motion and neck supple.  Skin:    General: Skin is warm.  Neurological:     Mental Status: He is alert and oriented to person, place, and time.  Psychiatric:        Mood and Affect: Affect normal.    LABORATORY DATA:  I have reviewed the data as listed Lab Results  Component Value Date   WBC 7.5 05/11/2021   HGB 11.8 (L) 05/11/2021   HCT 35.4 (L) 05/11/2021   MCV 83.7 05/11/2021   PLT 172 05/11/2021   Recent Labs    02/16/21 0935 03/16/21 0940 05/11/21 1023  NA 131* 133*  135  K 4.1 4.6 4.5  CL 98 99 99  CO2 '25 24 25  ' GLUCOSE 226* 212* 166*  BUN '14 15 18  ' CREATININE 1.08 1.06 1.05  CALCIUM 8.7* 9.1 8.8*  GFRNONAA >60 >60 >60  PROT 6.4* 6.9 7.2  ALBUMIN 3.7 3.7 3.8  AST '17 17 20  ' ALT '12 13 15  ' ALKPHOS 62 64 62  BILITOT 0.3 0.5 0.2*    RADIOGRAPHIC STUDIES: I have personally reviewed the radiological images as listed and agreed with the findings in the report. VAS [place] ABI WITH/WO TBI  Result Date: 05/04/2021  LOWER EXTREMITY DOPPLER STUDY Patient Name:  ESIQUIO BOESEN  Date of Exam:   04/26/2021 Medical Rec #: [id number]        Accession #:    [id number] Date of Birth: July 21, 1950       Patient Gender: M Patient Age:   70 years Exam Location:  [facility] Procedure:      VAS [place] ABI WITH/WO TBI Referring Phys: Eulogio Ditch --------------------------------------------------------------------------------  Indications: Claudication.  Comparison Study: 01/25/2021 Performing Technologist: Charlane Ferretti RT (R)(VS)  Examination Guidelines: A complete evaluation includes at minimum, Doppler waveform signals and systolic blood pressure reading at the level of bilateral brachial, anterior tibial, and posterior tibial arteries, when vessel segments are accessible. Bilateral testing is considered an integral part of a complete examination. Photoelectric Plethysmograph (PPG) waveforms and toe systolic pressure readings are included as required and additional duplex testing as needed. Limited examinations for reoccurring indications may be performed as noted.  ABI Findings: +---------+------------------+-----+----------+--------+ Right    Rt Pressure (mmHg)IndexWaveform  Comment  +---------+------------------+-----+----------+--------+ Brachial 159                                       +---------+------------------+-----+----------+--------+ ATA  107                    monophasic         +---------+------------------+-----+----------+--------+  PTA      104               0.65 biphasic           +---------+------------------+-----+----------+--------+ Great Toe99                0.62 Abnormal           +---------+------------------+-----+----------+--------+ +---------+------------------+-----+----------+-------+ Left     Lt Pressure (mmHg)IndexWaveform  Comment +---------+------------------+-----+----------+-------+ Brachial 159                                      +---------+------------------+-----+----------+-------+ ATA      143                    monophasic        +---------+------------------+-----+----------+-------+ PTA      124               0.78 monophasic        +---------+------------------+-----+----------+-------+ Great Toe107               0.67 Abnormal          +---------+------------------+-----+----------+-------+ +-------+-----------+-----------+------------+------------+ ABI/TBIToday's ABIToday's TBIPrevious ABIPrevious TBI +-------+-----------+-----------+------------+------------+ Right  .67        .62        .71         .55          +-------+-----------+-----------+------------+------------+ Left   .90        .67        .98         .80          +-------+-----------+-----------+------------+------------+ Bilateral ABIs appear decreased compared to prior study on 01/25/2021.  Summary: Right: Resting right ankle-brachial index indicates moderate right lower extremity arterial disease. The right toe-brachial index is abnormal. Right TBI appears essentially unchanged as compared to the previous exam on 01/25/2021. Left: Resting left ankle-brachial index indicates mild left lower extremity arterial disease. The left toe-brachial index is abnormal. Left TBI appears decreased as compared to the previous exam on 01/25/2021.  *See table(s) above for measurements and observations.  Electronically signed by Hortencia Pilar MD on 05/04/2021 at 3:22:38 PM.    Final     ASSESSMENT & PLAN:    Cancer of ascending colon ([place]) # Right colon cancer stage IV- liver metastases; adenocarcinoma; most recently on 5FU + avastin-chemotherapy currently on hold -because of mild to moderate intolerance/stability disease/patient preference. SEP 17th, 2022-CT scan chest and pelvis/noncontrast-  No evidence of progressive metastatic disease in the chest,abdomen or pelvis on noncontrast imaging. Left perihilar treatment changes no evidence of local recurrence-STABLE.  # Continue to monitor for now/continue chemo holiday. Will order CT scan- today for Jan 2023.  # LEFT hilar/LUL-  Limited stage small cell cancer-s/p chemo-RT; SEP  2022-CT chest- STABLE; will order scan today.   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   #Hyponatremia sodium 135- STABLE.   # Back pain- MRI lumbar spine- July 2021-NEG for mets; severe arthritic changes; spinal canal stenosis-stable hydrocodone 1 day prn -STABLE; refilled.  # Poorly controlled diabetes blood sugars-better controlled as per patients-overall stable FBG-166- STABLE.   # HTN- 120s/ 70-OFF[[ Avastin]-stable continue current anti-HTNs.   # DISPOSITION:  #  follow up in 2 months -MD; labs-cbc/cmp/cea; NO chemo; port flush; CT CAP proir --Dr.B     All questions were answered. The patient knows to call the clinic with any problems, questions or concerns.    Cammie Sickle, MD 05/12/2021 5:25 AM

## 2021-05-12 ENCOUNTER — Encounter: Payer: Self-pay | Admitting: Internal Medicine

## 2021-05-12 LAB — CEA: CEA: 4.1 ng/mL (ref 0.0–4.7)

## 2021-05-24 ENCOUNTER — Other Ambulatory Visit: Payer: Self-pay | Admitting: Internal Medicine

## 2021-05-24 DIAGNOSIS — C781 Secondary malignant neoplasm of mediastinum: Secondary | ICD-10-CM

## 2021-05-24 DIAGNOSIS — C182 Malignant neoplasm of ascending colon: Secondary | ICD-10-CM

## 2021-05-24 DIAGNOSIS — C801 Malignant (primary) neoplasm, unspecified: Secondary | ICD-10-CM

## 2021-05-25 ENCOUNTER — Other Ambulatory Visit: Payer: Self-pay | Admitting: *Deleted

## 2021-05-25 DIAGNOSIS — C781 Secondary malignant neoplasm of mediastinum: Secondary | ICD-10-CM

## 2021-05-25 DIAGNOSIS — C182 Malignant neoplasm of ascending colon: Secondary | ICD-10-CM

## 2021-05-31 IMAGING — CR CHEST - 2 VIEW
1 series · 2 of 2 positions shown · non-contrast
Comparison: Radiograph 01/23/2019 at 7 56 hours

CLINICAL DATA: Postop check;

EXAM:
CHEST - 2 VIEW

[Series 1: dg chest 2 view · 0.14mm/px · 2 of 2 slices shown]
[im 1/2]
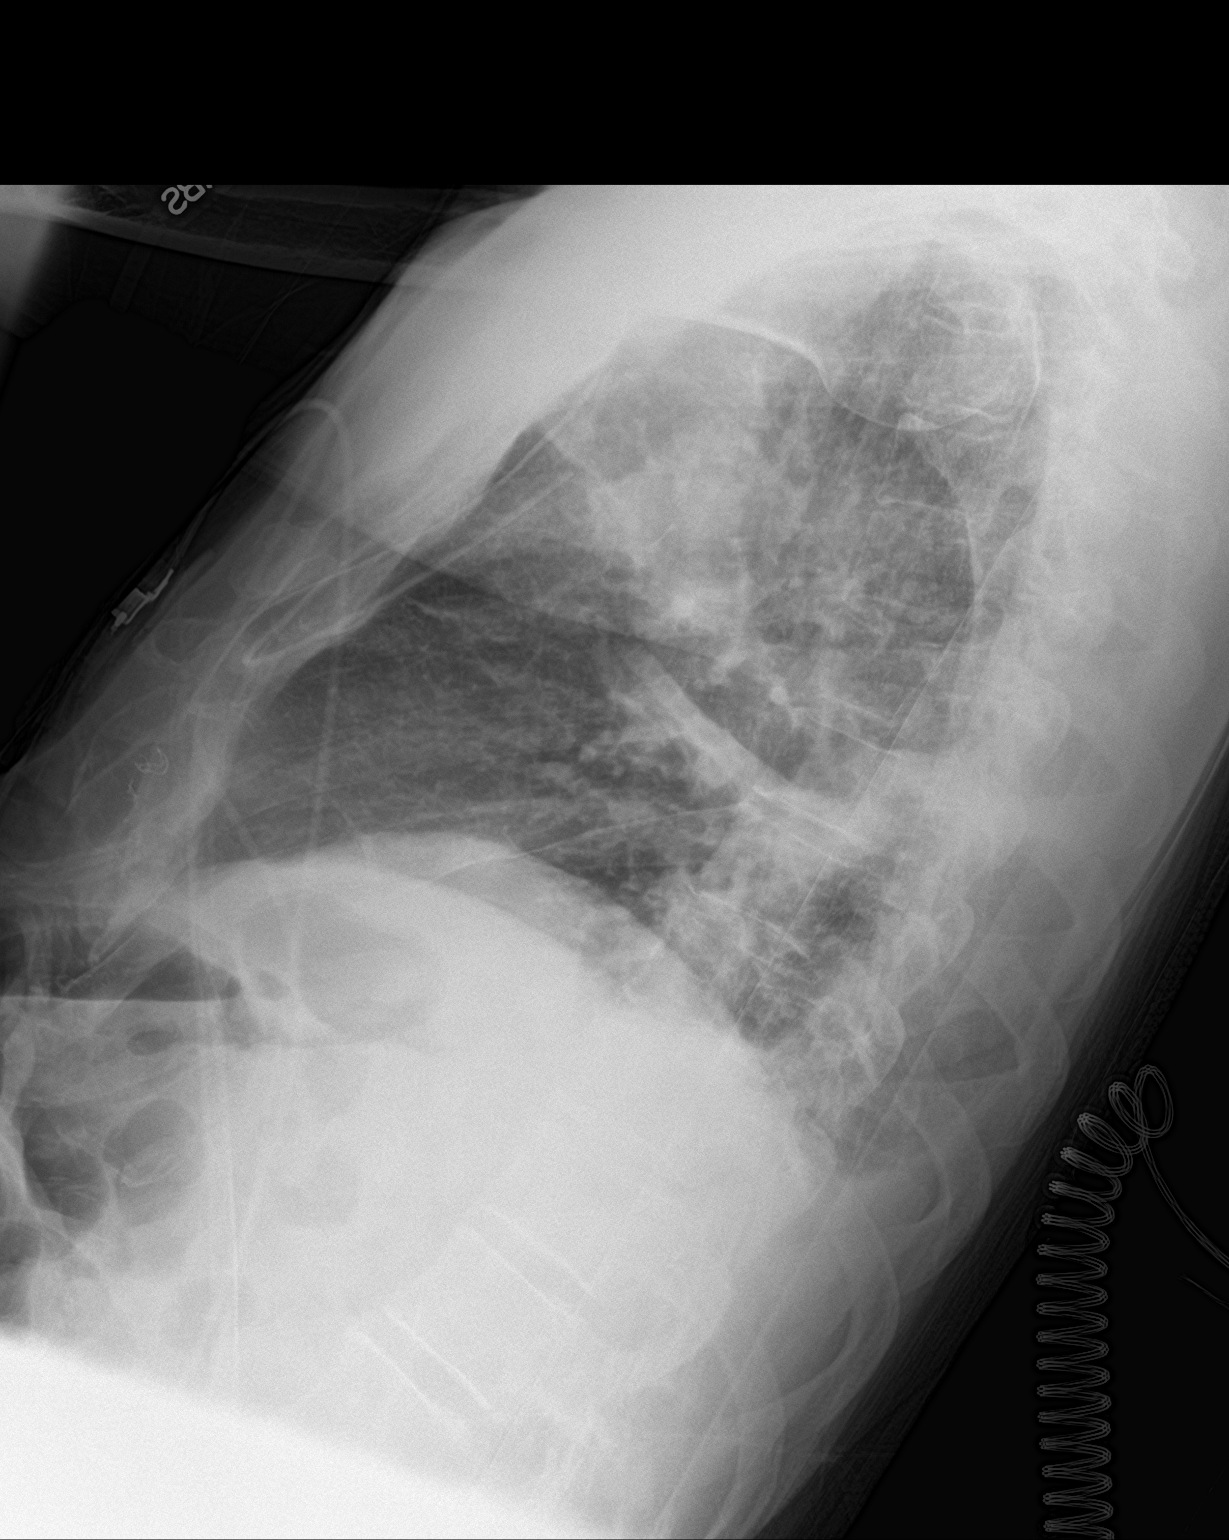
[im 2/2]
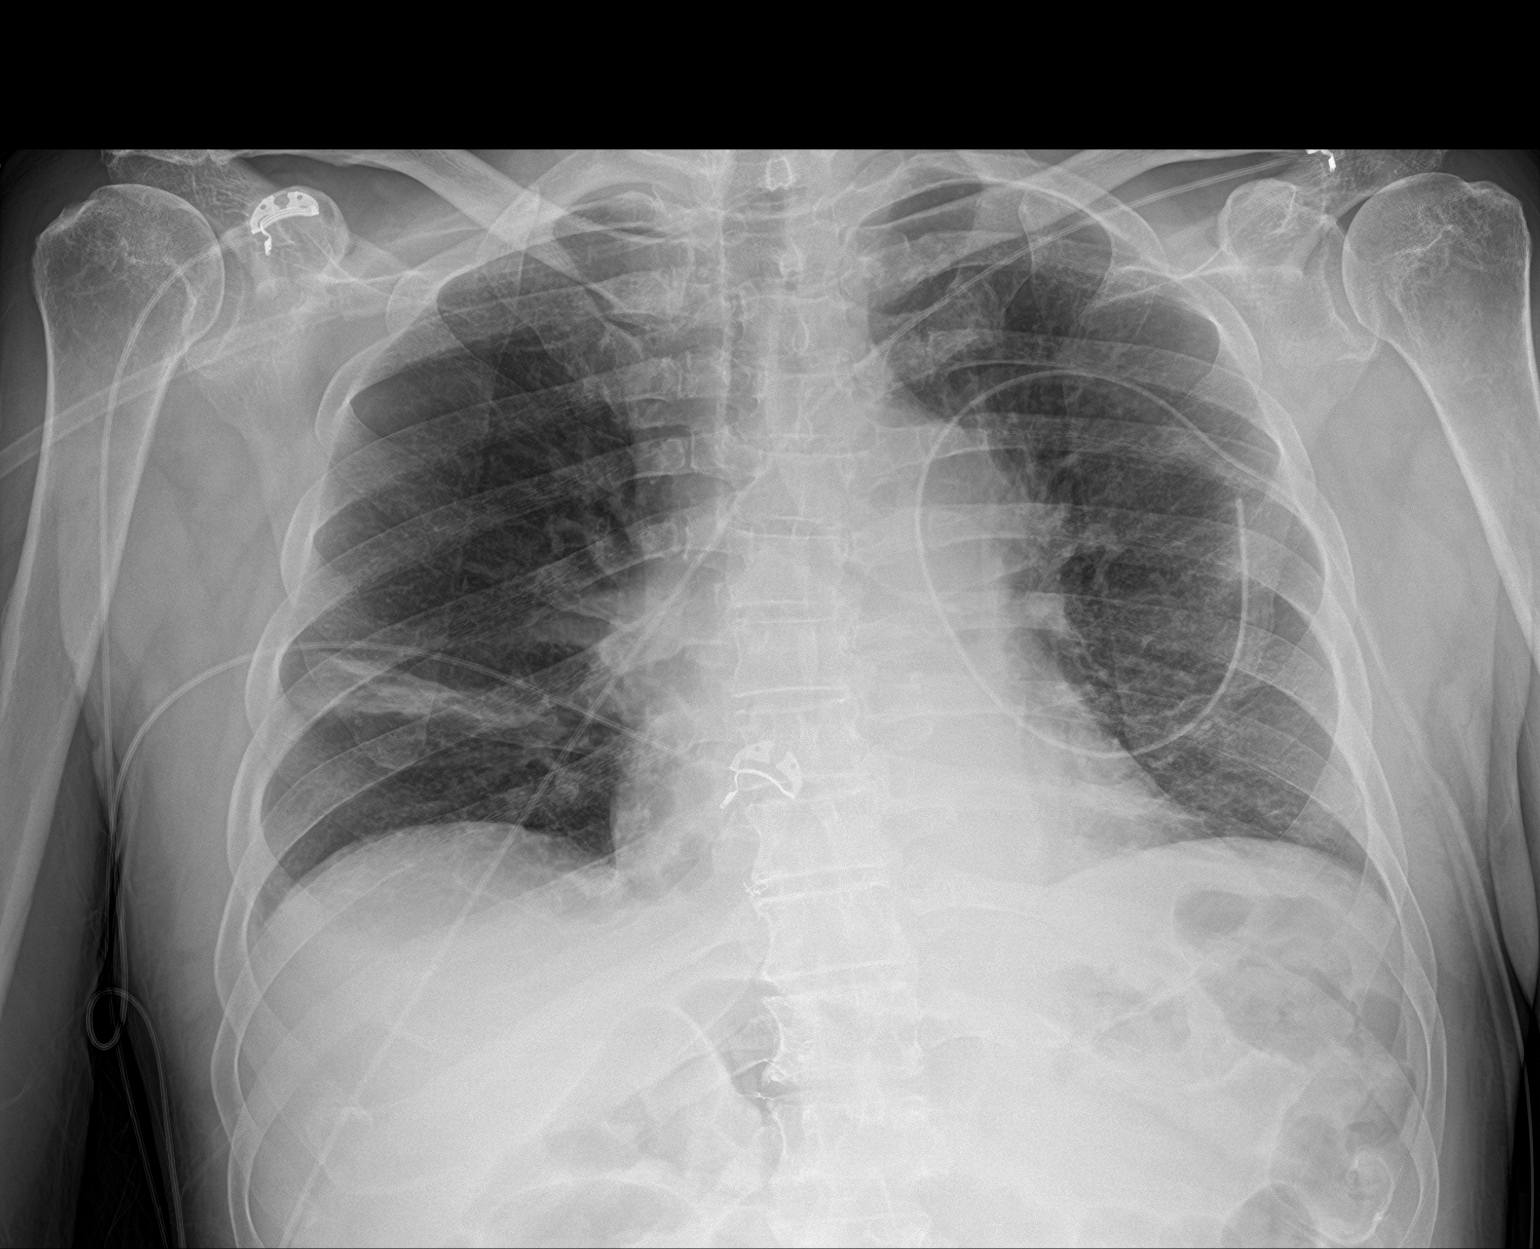

[2 of 2 positions shown; findings below may reference images not displayed]

FINDINGS: Normal cardiac silhouette. LEFT-sided chest tube in place without
pneumothorax. Lung bases are clear. Band of atelectasis in the RIGHT
midlung. No interval change.
IMPRESSION: 1. No interval change.
2. LEFT chest tube in place without pneumothorax.
3. RIGHT midlung atelectasis.

## 2021-05-31 IMAGING — DX PORTABLE CHEST - 1 VIEW
1 series · 1 of 1 positions shown · non-contrast
Comparison: 01/22/2019, 01/16/2019

CLINICAL DATA: Postop check

EXAM:
PORTABLE CHEST 1 VIEW

[chest ap]
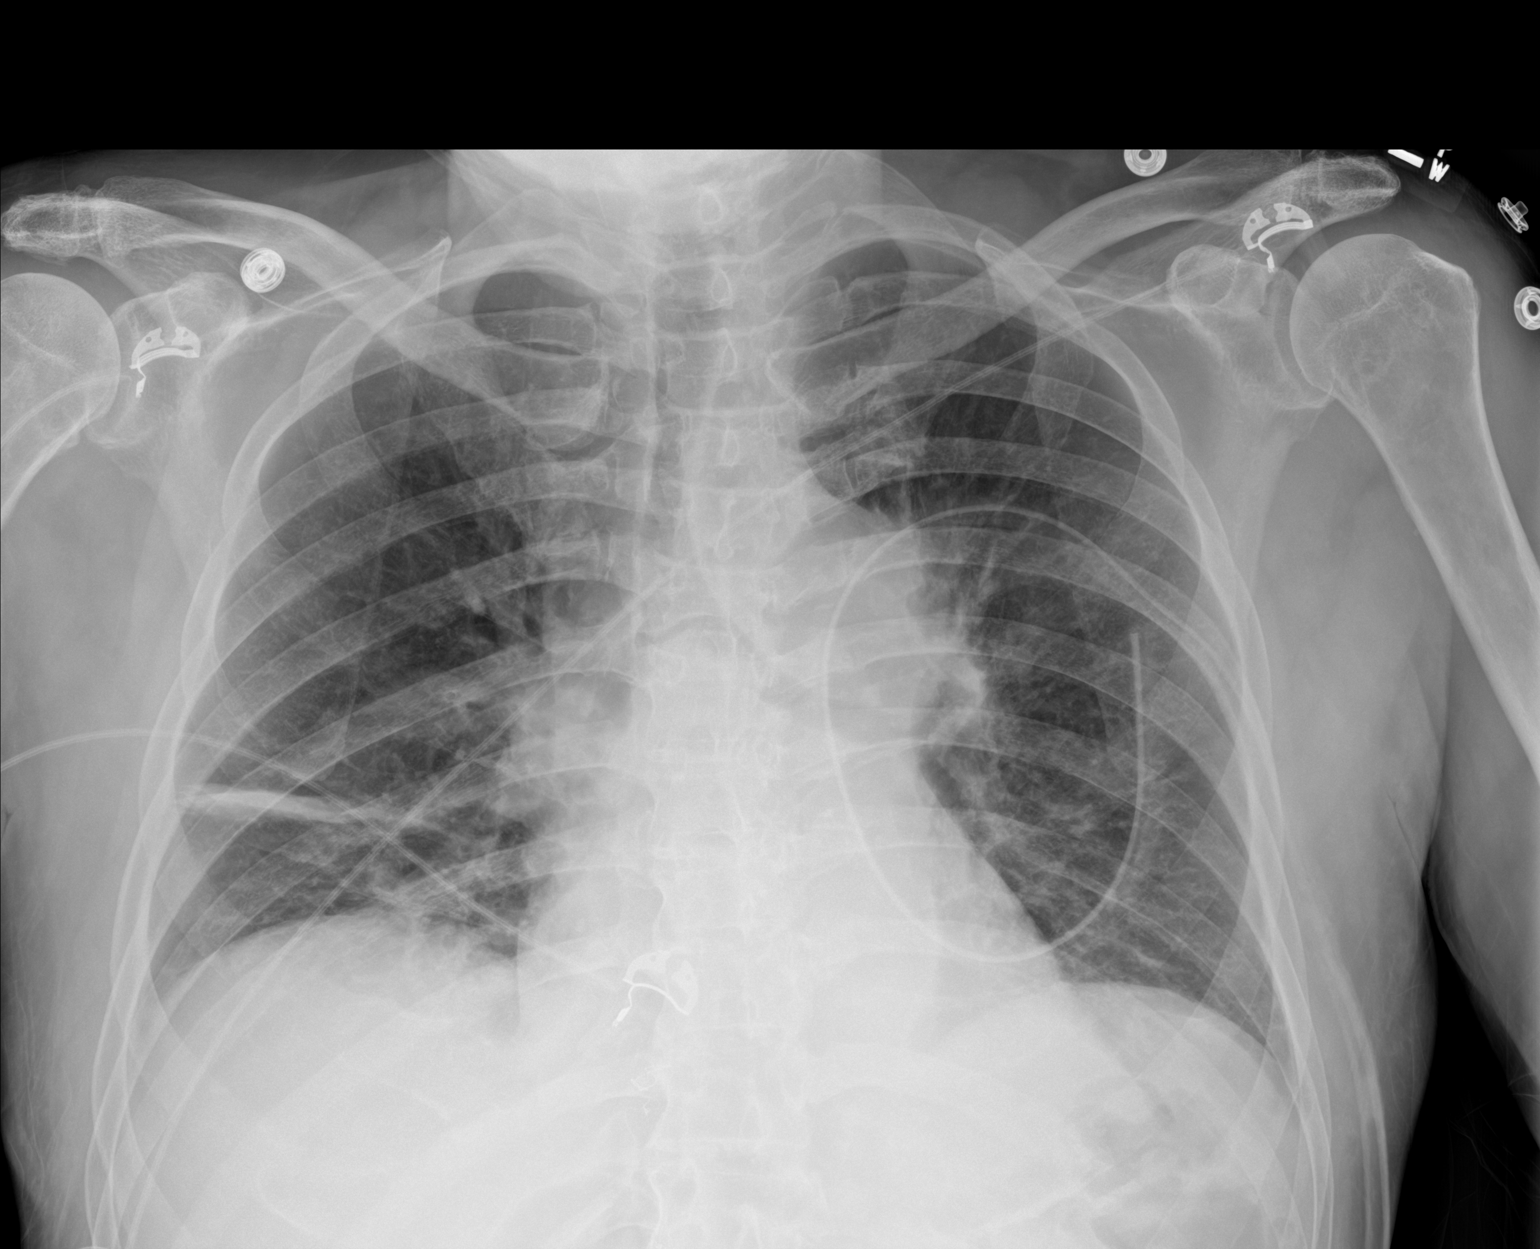

[1 of 1 positions shown; findings below may reference images not displayed]

FINDINGS: There is a left-sided chest tube in satisfactory position. There is
no pneumothorax. There is right lower lung discoid atelectasis.
There is no pleural effusion or pneumothorax. There is left
perihilar airspace disease likely reflecting atelectasis. Persistent
left AP window mass again noted.

There is no acute osseous abnormality.
IMPRESSION: 1. Left-sided chest tube in satisfactory position.  No pneumothorax.

## 2021-07-11 ENCOUNTER — Ambulatory Visit
Admission: RE | Admit: 2021-07-11 | Discharge: 2021-07-11 | Disposition: A | Discharge status: Home / Self Care | Payer: BC Managed Care – PPO | Source: Ambulatory Visit | Attending: Internal Medicine | Admitting: Internal Medicine

## 2021-07-11 ENCOUNTER — Other Ambulatory Visit: Payer: Self-pay

## 2021-07-11 DIAGNOSIS — C801 Malignant (primary) neoplasm, unspecified: Secondary | ICD-10-CM

## 2021-07-11 DIAGNOSIS — C781 Secondary malignant neoplasm of mediastinum: Secondary | ICD-10-CM | POA: Diagnosis present

## 2021-07-11 DIAGNOSIS — C182 Malignant neoplasm of ascending colon: Secondary | ICD-10-CM | POA: Diagnosis present

## 2021-07-11 LAB — POCT I-STAT CREATININE: Creatinine, Ser: 1.1 mg/dL (ref 0.61–1.24)

## 2021-07-11 MED ORDER — IOHEXOL 300 MG/ML  SOLN
85.0000 mL | Freq: Once | INTRAMUSCULAR | Status: AC | PRN
  Administered 2021-07-11: 85 mL via INTRAVENOUS

## 2021-07-13 ENCOUNTER — Other Ambulatory Visit: Payer: Self-pay

## 2021-07-13 ENCOUNTER — Inpatient Hospital Stay: Payer: BC Managed Care – PPO | Attending: Internal Medicine

## 2021-07-13 ENCOUNTER — Encounter: Payer: Self-pay | Admitting: Internal Medicine

## 2021-07-13 ENCOUNTER — Inpatient Hospital Stay (HOSPITAL_BASED_OUTPATIENT_CLINIC_OR_DEPARTMENT_OTHER): Payer: BC Managed Care – PPO | Admitting: Internal Medicine

## 2021-07-13 DIAGNOSIS — E871 Hypo-osmolality and hyponatremia: Secondary | ICD-10-CM | POA: Diagnosis not present

## 2021-07-13 DIAGNOSIS — Z452 Encounter for adjustment and management of vascular access device: Secondary | ICD-10-CM | POA: Diagnosis not present

## 2021-07-13 DIAGNOSIS — C182 Malignant neoplasm of ascending colon: Secondary | ICD-10-CM | POA: Diagnosis not present

## 2021-07-13 DIAGNOSIS — Z9221 Personal history of antineoplastic chemotherapy: Secondary | ICD-10-CM | POA: Diagnosis not present

## 2021-07-13 DIAGNOSIS — Z79899 Other long term (current) drug therapy: Secondary | ICD-10-CM | POA: Diagnosis not present

## 2021-07-13 DIAGNOSIS — Z7951 Long term (current) use of inhaled steroids: Secondary | ICD-10-CM | POA: Diagnosis not present

## 2021-07-13 DIAGNOSIS — E1165 Type 2 diabetes mellitus with hyperglycemia: Secondary | ICD-10-CM | POA: Insufficient documentation

## 2021-07-13 DIAGNOSIS — I10 Essential (primary) hypertension: Secondary | ICD-10-CM | POA: Insufficient documentation

## 2021-07-13 DIAGNOSIS — Z85038 Personal history of other malignant neoplasm of large intestine: Secondary | ICD-10-CM | POA: Diagnosis present

## 2021-07-13 DIAGNOSIS — Z9049 Acquired absence of other specified parts of digestive tract: Secondary | ICD-10-CM | POA: Diagnosis not present

## 2021-07-13 DIAGNOSIS — Z85118 Personal history of other malignant neoplasm of bronchus and lung: Secondary | ICD-10-CM | POA: Diagnosis present

## 2021-07-13 DIAGNOSIS — C781 Secondary malignant neoplasm of mediastinum: Secondary | ICD-10-CM

## 2021-07-13 DIAGNOSIS — Z7984 Long term (current) use of oral hypoglycemic drugs: Secondary | ICD-10-CM | POA: Insufficient documentation

## 2021-07-13 DIAGNOSIS — Z95828 Presence of other vascular implants and grafts: Secondary | ICD-10-CM

## 2021-07-13 DIAGNOSIS — C801 Malignant (primary) neoplasm, unspecified: Secondary | ICD-10-CM

## 2021-07-13 DIAGNOSIS — Z794 Long term (current) use of insulin: Secondary | ICD-10-CM | POA: Insufficient documentation

## 2021-07-13 DIAGNOSIS — Z923 Personal history of irradiation: Secondary | ICD-10-CM | POA: Diagnosis not present

## 2021-07-13 LAB — COMPREHENSIVE METABOLIC PANEL
ALT: 13 U/L (ref 0–44)
AST: 15 U/L (ref 15–41)
Albumin: 3.9 g/dL (ref 3.5–5.0)
Alkaline Phosphatase: 73 U/L (ref 38–126)
Anion gap: 6 (ref 5–15)
BUN: 18 mg/dL (ref 8–23)
CO2: 27 mmol/L (ref 22–32)
Calcium: 9.1 mg/dL (ref 8.9–10.3)
Chloride: 99 mmol/L (ref 98–111)
Creatinine, Ser: 1.18 mg/dL (ref 0.61–1.24)
GFR, Estimated: >60 mL/min (ref >60)
Glucose, Bld: 282 mg/dL — ABNORMAL HIGH (ref 70–99)
Potassium: 4.6 mmol/L (ref 3.5–5.1)
Sodium: 132 mmol/L — ABNORMAL LOW (ref 135–145)
Total Bilirubin: 0.6 mg/dL (ref 0.3–1.2)
Total Protein: 7.1 g/dL (ref 6.5–8.1)

## 2021-07-13 LAB — CBC WITH DIFFERENTIAL/PLATELET
Abs Immature Granulocytes: 0.03 10*3/uL (ref 0.00–0.07)
Basophils Absolute: 0.1 10*3/uL (ref 0.0–0.1)
Basophils Relative: 1 %
Eosinophils Absolute: 0.4 10*3/uL (ref 0.0–0.5)
Eosinophils Relative: 4 %
HCT: 31.8 % — ABNORMAL LOW (ref 39.0–52.0)
Hemoglobin: 10.7 g/dL — ABNORMAL LOW (ref 13.0–17.0)
Immature Granulocytes: 0 %
Lymphocytes Relative: 21 %
Lymphs Abs: 1.8 10*3/uL (ref 0.7–4.0)
MCH: 28 pg (ref 26.0–34.0)
MCHC: 33.6 g/dL (ref 30.0–36.0)
MCV: 83.2 fL (ref 80.0–100.0)
Monocytes Absolute: 0.7 10*3/uL (ref 0.1–1.0)
Monocytes Relative: 8 %
Neutro Abs: 5.8 10*3/uL (ref 1.7–7.7)
Neutrophils Relative %: 66 %
Platelets: 209 10*3/uL (ref 150–400)
RBC: 3.82 MIL/uL — ABNORMAL LOW (ref 4.22–5.81)
RDW: 14.3 % (ref 11.5–15.5)
WBC: 8.8 10*3/uL (ref 4.0–10.5)
nRBC: 0 % (ref 0.0–0.2)

## 2021-07-13 MED ORDER — HEPARIN SOD (PORK) LOCK FLUSH 100 UNIT/ML IV SOLN
INTRAVENOUS | Status: AC
  Filled 2021-07-13: qty 5

## 2021-07-13 MED ORDER — HYDROCODONE-ACETAMINOPHEN 5-325 MG PO TABS: 1.0000 | ORAL_TABLET | Freq: Two times a day (BID) | ORAL | 0 refills | Status: DC | PRN

## 2021-07-13 MED ORDER — HEPARIN SOD (PORK) LOCK FLUSH 100 UNIT/ML IV SOLN
500.0000 [IU] | Freq: Once | INTRAVENOUS | Status: AC
  Administered 2021-07-13: 500 [IU] via INTRAVENOUS
  Filled 2021-07-13: qty 5

## 2021-07-13 MED ORDER — SODIUM CHLORIDE 0.9% FLUSH
10.0000 mL | INTRAVENOUS | Status: AC | PRN
  Administered 2021-07-13: 10 mL via INTRAVENOUS
  Filled 2021-07-13: qty 10

## 2021-07-13 NOTE — Progress Notes (Signed)
[facility] NOTE  Patient Care Team: Laneta Simmers, NP (Inactive) as PCP - General (Nurse Practitioner) Telford Nab, RN as Registered Nurse Clent Jacks, RN as Oncology Nurse Navigator  CHIEF COMPLAINTS/PURPOSE OF CONSULTATION: Lung cancer/colon cancer   Oncology History Overview Note  # July 2020- SMALL CELL CA METASTATIC TO MEDIASTINAL LN [open Bx; Dr.Oaks]; TxN2M0; July 2nd 2020-PET- 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  MRI brain negative  # aug 17th 2020-carbo etoposide-RT [? 8/19]; #4 cycle carbo-Etop [finished dec 30th,2020]  #August 2020 iron deficient anemia-question etiology; IV Feraheme [no colonoscopy]  # OCT 2020- colo/ Cecal adeno ca; MRI liver- 1 cm enhancing lesion "metastasis" [Dr.Anna/Dr.Cannon]-difficulty biopsy.  November 02-2019 right hemicolectomy- STAGE III [pT3pN1 (2/13LN)]- HOLD adjuvant therapy. MMR- INTACT/LOW  #May 2021-liver biopsy-possible adenocarcinoma; colorectal origin.  Stage IV colon cancer  #May 24-2021-FOLFOX with Avastin; AUG 31st,2021- STABLE liver lesion; SEP 1st, 2021- Discontinue LV+Ox sec to severe fatigue/PN; cont 5FU CIV + Avastin  # COPD/  DM-2- on OHA/ smoker/ PVD/peripheral neuropathy  # History of alcohol abuse/quit 2014/ Hx of abdominal trauma [at 21y]  #July 2021-foundation 1 NGS [liver metastases]- RAS WILD TYPE **  # Jan 1st, 2022-Covid infection [prior vaccination; monoclonal infusion-not hospitalized]  # DIAGNOSIS:   # SMALL CELL CA-limited stage-status post chemoradiation;   # colon cancer-stage IV-solitary liver lesion  GOALS: Control  CURRENT/MOST RECENT THERAPY : FOLFOX with Avastin   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 ([place] Mck[place])  01/29/2019 Initial Diagnosis   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 [facility])   02/09/2019 - 06/24/2019 Chemotherapy   The patient had palonosetron (ALOXI)  injection 0.25 mg, 0.25 mg, Intravenous,  Once, 4 of 4 cycles Administration: 0.25 mg (02/09/2019), 0.25 mg (03/03/2019), 0.25 mg (06/02/2019), 0.25 mg (06/22/2019) CARBOplatin (PARAPLATIN) 410 mg in sodium chloride 0.9 % 250 mL chemo infusion, 410 mg (100 % of original dose 414 mg), Intravenous,  Once, 4 of 4 cycles Dose modification:   (original dose 414 mg, Cycle 1) Administration: 410 mg (02/09/2019), 410 mg (03/03/2019), 410 mg (06/02/2019), 410 mg (06/22/2019) etoposide (VEPESID) 180 mg in sodium chloride 0.9 % 500 mL chemo infusion, 100 mg/m2 = 180 mg, Intravenous,  Once, 4 of 4 cycles Administration: 180 mg (02/09/2019), 180 mg (02/10/2019), 180 mg (02/11/2019), 180 mg (03/03/2019), 180 mg (03/04/2019), 180 mg (03/05/2019), 180 mg (06/02/2019), 180 mg (06/03/2019), 180 mg (06/04/2019), 180 mg (06/22/2019), 180 mg (06/23/2019), 180 mg (06/24/2019) fosaprepitant (EMEND) 150 mg, dexamethasone (DECADRON) 6 mg in sodium chloride 0.9 % 145 mL IVPB, , Intravenous,  Once, 2 of 2 cycles Administration:  (06/02/2019),  (06/22/2019)   for chemotherapy treatment.     Cancer of ascending colon (Kevin Mckay)  05/04/2019 Initial Diagnosis   Cancer of ascending colon ([place])   11/30/2019 -  Chemotherapy    Patient is on Treatment Plan: COLORECTAL FOLFOX + BEVACIZUMAB Q14D        HISTORY OF PRESENTING ILLNESS: Alone.  Ambulating independently.  [place] Kevin Mckay 71 y.o.  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  Continues to have chronic shortness of breath and chronic cough.  He continues to unfortunately smoke.  No headaches.  No falls.  Intermittent dizziness.   Review  of Systems  Constitutional:  Positive for malaise/fatigue and weight loss. Negative for chills,  diaphoresis and fever.  HENT:  Negative for nosebleeds and sore throat.   Eyes:  Negative for double vision.  Respiratory:  Negative for hemoptysis and wheezing.   Cardiovascular:  Negative for chest pain, palpitations and orthopnea.  Gastrointestinal:  Negative for abdominal pain, blood in stool, constipation, diarrhea, heartburn, melena, nausea and vomiting.  Genitourinary:  Negative for dysuria, frequency and urgency.  Musculoskeletal:  Positive for back pain and joint pain.  Skin: Negative.  Negative for itching and rash.  Neurological:  Positive for tingling. Negative for dizziness, focal weakness and weakness.  Endo/Heme/Allergies:  Does not bruise/bleed easily.  Psychiatric/Behavioral:  Negative for depression. The patient is not nervous/anxious and does not have insomnia.     MEDICAL HISTORY:  Past Medical History:  Diagnosis Date   Asthma    Cancer ([place])    Metastatic small cell lung cancer   Cerebral aneurysm    Chronic painful diabetic neuropathy (H[facility])    Depression    Diabetes mellitus without complication ([place])    Essential hypertension    History of kidney stones    Occasional tremors    Tobacco use     SURGICAL HISTORY: Past Surgical History:  Procedure Laterality Date   ABDOMINAL SURGERY     age 34. trauma surgery due to forklift injury- liver and spleen   COLONOSCOPY WITH PROPOFOL N/A 03/27/2019   Procedure: COLONOSCOPY WITH PROPOFOL;  Surgeon: Jonathon Bellows, MD;  Location: [facility] ENDOSCOPY;  Service: Gastroenterology;  Laterality: N/A;   COLOSTOMY REVISION Right 05/04/2019   Procedure: COLON RESECTION RIGHT-right hemicolectomy-open;  Surgeon: Fredirick Maudlin, MD;  Location: [facility] ORS;  Service: General;  Laterality: Right;   ESOPHAGOGASTRODUODENOSCOPY (EGD) WITH PROPOFOL N/A 03/27/2019   Procedure: ESOPHAGOGASTRODUODENOSCOPY (EGD) WITH PROPOFOL;  Surgeon: Jonathon Bellows, MD;  Location: [facility] ENDOSCOPY;  Service: Gastroenterology;  Laterality: N/A;   IR GENERIC  HISTORICAL  05/31/2016   IR ANGIO VERTEBRAL SEL VERTEBRAL BILAT MOD SED 05/31/2016 Consuella Lose, MD [facility]-INTER[facility] RAD   IR GENERIC HISTORICAL  05/31/2016   IR ANGIO INTRA EXTRACRAN SEL INTERNAL CAROTID BILAT MOD SED 05/31/2016 Consuella Lose, MD [facility]   PORTACATH PLACEMENT Right 02/04/2019   Procedure: INSERTION PORT-A-CATH;  Surgeon: [name], MD;  Location: [facility] ORS;  Service: General;  Laterality: Right;   THORACOTOMY Left 01/22/2019   Procedure: PRE OP BRONCH LEFT ANTERIOR THORACOTOMY WITH BIOPSY OF MEDIASTINAL MASS;  Surgeon: [name], MD;  Location: [facility] ORS;  Service: General;  Laterality: Left;    SOCIAL HISTORY: Social History   Socioeconomic History   Marital status: Married    Spouse name: Not on file   Number of children: Not on file   Years of education: Not on file   Highest education level: Not on file  Occupational History   Not on file  Tobacco Use   Smoking status: Every Day    Packs/day: 0.50    Years: 50.00    Pack years: 25.00    Types: Cigarettes   Smokeless tobacco: Never  Vaping Use   Vaping Use: Never used  Substance and Sexual Activity   Alcohol use: No   Drug use: Yes    Types: Barbituates, Marijuana   Sexual activity: Not Currently  Other Topics Concern   Not on file  Social History Narrative   Lives in [place]; wife/ son/grandson [custody]; smoker; vending business; hx of alcoholism- quit at 40.    Social Determinants of  Health   Financial Resource Strain: Not on file  Food Insecurity: Not on file  Transportation Needs: Not on file  Physical Activity: Not on file  Stress: Not on file  Social Connections: Not on file  Intimate Partner Violence: Not on file    FAMILY HISTORY: Family History  Problem Relation Age of Onset   Lymphoma Father    Liver cancer Paternal Uncle    Lung cancer Paternal Uncle    Lung cancer Maternal Uncle     ALLERGIES:  is allergic to ferumoxytol, amoxicillin, and codeine.  MEDICATIONS:   Current Outpatient Medications  Medication Sig Dispense Refill   amLODipine (NORVASC) 10 MG tablet Take 10 mg by mouth every morning.      budesonide-formoterol (SYMBICORT) 160-4.5 MCG/ACT inhaler Inhale 2 puffs into the lungs 2 (two) times daily.     clopidogrel (PLAVIX) 75 MG tablet Take 1 tablet (75 mg total) by mouth daily. 30 tablet 6   desonide (DESOWEN) 0.05 % cream Apply 1 application topically 2 (two) times daily as needed.     DULoxetine (CYMBALTA) 30 MG capsule Take 30 mg by mouth every morning.      gabapentin (NEURONTIN) 800 MG tablet Take 800 mg by mouth 3 (three) times daily.     insulin glargine (LANTUS) 100 UNIT/ML injection Inject into the skin daily. Units based By sliding scale     lidocaine-prilocaine (EMLA) cream Apply 1 application topically as needed. 30-45 mins prior to port access. 30 g 0   lisinopril-hydrochlorothiazide (ZESTORETIC) 10-12.5 MG tablet Take 1 tablet by mouth daily.     lovastatin (MEVACOR) 20 MG tablet Take 20 mg by mouth every evening.     metFORMIN (GLUCOPHAGE) 1000 MG tablet Take 1,000 mg by mouth 2 (two) times daily with a meal.      omeprazole (PRILOSEC) 40 MG capsule Take 1 capsule (40 mg total) by mouth daily. 90 capsule 1   polyethylene glycol powder (MIRALAX) 17 GM/SCOOP powder Mix full container in 64 ounces of Gatorade or other clear liquid. NO RED Liquids 238 g 0   prazosin (MINIPRESS) 1 MG capsule Take 1 mg by mouth at bedtime.     pyridOXINE (VITAMIN B-6) 100 MG tablet Take 100 mg by mouth daily.     sitaGLIPtin (JANUVIA) 25 MG tablet Take 25 mg by mouth daily.     sodium chloride 1 g tablet 1 g daily.     HYDROcodone-acetaminophen (NORCO/VICODIN) 5-325 MG tablet Take 1 tablet by mouth every 12 (twelve) hours as needed for moderate pain. 45 tablet 0   Ipratropium-Albuterol (COMBIVENT) 20-100 MCG/ACT AERS respimat Inhale 1 puff into the lungs every 6 (six) hours as needed.     ondansetron (ZOFRAN) 8 MG tablet Take 1 tablet (8 mg total) by  mouth every 8 (eight) hours as needed for nausea or vomiting (start 3 days; after chemo). (Patient not taking: Reported on 05/11/2021) 40 tablet 1   No current facility-administered medications for this visit.   Facility-Administered Medications Ordered in Other Visits  Medication Dose Route Frequency Provider Last Rate Last Admin   heparin lock flush 100 UNIT/ML injection            heparin lock flush 100 UNIT/ML injection            sodium chloride flush (NS) 0.9 % injection 10 mL  10 mL Intravenous PRN Charlaine Dalton R, MD   10 mL at 09/13/20 0943   sodium chloride flush (NS) 0.9 % injection 10 mL  10 mL Intravenous PRN Cammie Sickle, MD   10 mL at 07/13/21 0948      .  PHYSICAL EXAMINATION: ECOG PERFORMANCE STATUS: 0 - Asymptomatic  Vitals:   07/13/21 0955  BP: 137/61  Pulse: 80  Temp: 98.2 F (36.8 C)  SpO2: 98%   Filed Weights   07/13/21 0955  Weight: 137 lb 9.6 oz (62.4 kg)    Physical Exam HENT:     Head: Normocephalic and atraumatic.     Mouth/Throat:     Pharynx: No oropharyngeal exudate.  Eyes:     Pupils: Pupils are equal, round, and reactive to light.  Cardiovascular:     Rate and Rhythm: Normal rate and regular rhythm.  Pulmonary:     Effort: No respiratory distress.     Breath sounds: No wheezing.  Abdominal:     General: Bowel sounds are normal. There is no distension.     Palpations: Abdomen is soft. There is no mass.     Tenderness: There is no abdominal tenderness. There is no guarding or rebound.     Comments: Abdominal incision well-healed.  Musculoskeletal:        General: No tenderness. Normal range of motion.     Cervical back: Normal range of motion and neck supple.  Skin:    General: Skin is warm.  Neurological:     Mental Status: He is alert and oriented to person, place, and time.  Psychiatric:        Mood and Affect: Affect normal.    LABORATORY DATA:  I have reviewed the data as listed Lab Results  Component  Value Date   WBC 8.8 07/13/2021   HGB 10.7 (L) 07/13/2021   HCT 31.8 (L) 07/13/2021   MCV 83.2 07/13/2021   PLT 209 07/13/2021   Recent Labs    03/16/21 0940 05/11/21 1023 07/11/21 0939 07/13/21 0939  NA 133* 135  --  132*  K 4.6 4.5  --  4.6  CL 99 99  --  99  CO2 24 25  --  27  GLUCOSE 212* 166*  --  282*  BUN 15 18  --  18  CREATININE 1.06 1.05 1.10 1.18  CALCIUM 9.1 8.8*  --  9.1  GFRNONAA >60 >60  --  >60  PROT 6.9 7.2  --  7.1  ALBUMIN 3.7 3.8  --  3.9  AST 17 20  --  15  ALT 13 15  --  13  ALKPHOS 64 62  --  73  BILITOT 0.5 0.2*  --  0.6    RADIOGRAPHIC STUDIES: I have personally reviewed the radiological images as listed and agreed with the findings in the report. CT CHEST ABDOMEN PELVIS W CONTRAST  Result Date: 07/11/2021 CLINICAL DATA:  Small-cell lung cancer, colorectal cancer, status post radiation therapy, right hemicolectomy, assess treatment response EXAM: CT CHEST, ABDOMEN, AND PELVIS WITH CONTRAST TECHNIQUE: Multidetector CT imaging of the chest, abdomen and pelvis was performed following the standard protocol during bolus administration of intravenous contrast. RADIATION DOSE REDUCTION: This exam was performed according to the departmental dose-optimization program which includes automated exposure control, adjustment of the mA and/or kV according to patient size and/or use of iterative reconstruction technique. CONTRAST:  63m OMNIPAQUE IOHEXOL 300 MG/ML SOLN, additional oral enteric contrast COMPARISON:  03/10/2021 FINDINGS: CT CHEST FINDINGS Cardiovascular: Right chest port catheter. Aortic atherosclerosis. Normal heart size. Three-vessel coronary artery calcifications. No pericardial effusion. Mediastinum/Nodes: No enlarged mediastinal, hilar, or axillary lymph nodes.  Thyroid gland, trachea, and esophagus demonstrate no significant findings. Lungs/Pleura: Mild centrilobular and paraseptal emphysema. Diffuse bilateral bronchial wall thickening. Unchanged  perihilar and suprahilar post treatment/post radiation fibrosis and consolidation of the left lung. There are multiple new tiny pulmonary nodules, for example a 0.2 cm nodule of the superior segment left lower lobe (series 3, image 62), a 0.3 cm nodule of the posterior left apex (series 3, image 30), and a 0.3 cm nodule of the peripheral right apex (series 3, image 31). Additional background of very fine centrilobular pulmonary nodules, not significantly changed. No pleural effusion or pneumothorax. Musculoskeletal: No chest wall mass or suspicious osseous lesions identified. CT ABDOMEN PELVIS FINDINGS Hepatobiliary: No significant change in a subcapsular lesion of the peripheral liver dome measuring 1.3 x 0.7 cm (series 2, image 50). No gallstones, gallbladder wall thickening, or biliary dilatation. Pancreas: Unremarkable. No pancreatic ductal dilatation or surrounding inflammatory changes. Spleen: Normal in size without significant abnormality. Adrenals/Urinary Tract: Adrenal glands are unremarkable. Kidneys are normal, without renal calculi, solid lesion, or hydronephrosis. Thickening of the urinary bladder wall, likely related to chronic outlet obstruction Stomach/Bowel: Stomach is within normal limits. Status post right hemicolectomy and ileocolic anastomosis. No evidence of bowel wall thickening, distention, or inflammatory changes. Descending and sigmoid diverticulosis. Vascular/Lymphatic: Aortic atherosclerosis. Numerous splenic and gastric varices about the left upper quadrant (series 2, image 57). No enlarged abdominal or pelvic lymph nodes. Reproductive: No mass or other abnormality. Other: No abdominal wall hernia or abnormality. No ascites. Musculoskeletal: No acute osseous findings. IMPRESSION: 1. Unchanged perihilar and suprahilar post treatment/post radiation fibrosis and consolidation of the left lung. 2. There are multiple new bilateral tiny pulmonary nodules measuring 0.3 cm and smaller. These are  nonspecific and possibly infectious or inflammatory although suspicious for early metastatic disease. Attention on follow-up. 3. Additional background of very fine centrilobular pulmonary nodules, not significantly changed and consistent with smoking-related respiratory bronchiolitis. 4. No significant change in a subcapsular lesion of the peripheral liver dome reflecting a biopsy proven metastasis. No evidence of new metastatic disease in the abdomen or pelvis. Attention on follow-up. 5. Status post right hemicolectomy and ileocolic anastomosis. 6. Emphysema. 7. Coronary artery disease. Aortic Atherosclerosis (ICD10-I70.0) and Emphysema (ICD10-J43.9). Electronically Signed   By: Delanna Ahmadi M.D.   On: 07/11/2021 12:42    ASSESSMENT & PLAN:   Cancer of ascending colon ([place]) # Right colon cancer stage IV- liver metastases; adenocarcinoma; most recently on 5FU + avastin-chemotherapy currently on hold -because of mild to moderate intolerance/stability disease/patient preference. JAN 17th, 2023-no evidence of progression of subcapsular liver lesion/biopsy-proven colon cancer; no evidence of new disease. Continue surveillance of therapy/given poor tolerance/ patient preference. Continue to monitor for now/continue chemo holiday.  # LEFT hilar/LUL-  Limited stage small cell cancer-s/p chemo-RT; JAN 17th, 2023-CT chest- Left perihilar treatment changes no evidence of local recurrence-STABLE.   # JAN 2023-bilateral up to 3 mm lung nodules-inflammatory versus malignancy.-Recommend monitoring for now.  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   #Hyponatremia sodium 132- STABLE.   # Back pain- MRI lumbar spine- July 2021-NEG for mets; severe arthritic changes; spinal canal stenosis-stable hydrocodone 1 day prn -STABLE. ; refilled.  # Poorly controlled diabetes blood sugars-better controlled as per patients-overall stable [id number]-   # HTN- 120s/ 70-OFF[[ Avastin]- continue current  anti-HTNs. - stable  #Incidental findings on CT CAP Imaging dated: JAN, 2023:  Status post right hemicolectomy and ileocolic anastomosis;  Emphysema;  Coronary artery disease. I reviewed/discussed/counseled the patient.   #  DISPOSITION:  # follow up in 2 months -MD; labs-cbc/cmp/cea; NO chemo; port flush; -Dr.B  # I reviewed the blood work- with the patient in detail; also reviewed the imaging independently [as summarized above]; and with the patient in detail.    All questions were answered. The patient knows to call the clinic with any problems, questions or concerns.    Cammie Sickle, MD 07/13/2021 1:33 PM

## 2021-07-13 NOTE — Progress Notes (Signed)
Pt states he fell 2 weeks ago, he felt dizzy, concerned with why he may be getting dizzy.  Needs refill of his hydrocodone.

## 2021-07-13 NOTE — Assessment & Plan Note (Addendum)
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 JAN 17th, 2023-no evidence of progression of subcapsular liver lesion/biopsy-proven colon cancer; no evidence of new disease. Continue surveillance of therapy/given poor tolerance/ patient preference. Continue to monitor for now/continue chemo holiday.  # LEFT hilar/LUL-  Limited stage small cell cancer-s/p chemo-RT; JAN 17th, 2023-CT chest- Left perihilar treatment changes no evidence of local recurrence-STABLE.   # JAN 2023-bilateral up to 3 mm lung nodules-inflammatory versus malignancy.-Recommend monitoring for now.  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   #Hyponatremia sodium 132- STABLE.   # Back pain- MRI lumbar spine- July 2021-NEG for mets; severe arthritic changes; spinal canal stenosis-stable hydrocodone 1 day prn -STABLE. ; refilled.  # Poorly controlled diabetes blood sugars-better controlled as per patients-overall stable FBG-288-   # HTN- 120s/ 70-OFF[[ Avastin]- continue current anti-HTNs. - stable  #Incidental findings on CT CAP Imaging dated: JAN, 2023:  Status post right hemicolectomy and ileocolic anastomosis;  Emphysema;  Coronary artery disease. I reviewed/discussed/counseled the patient.   # DISPOSITION:  # follow up in 2 months -MD; labs-cbc/cmp/cea; NO chemo; port flush; -Dr.B  # I reviewed the blood work- with the patient in detail; also reviewed the imaging independently [as summarized above]; and with the patient in detail.

## 2021-07-14 LAB — CEA: CEA: 4.1 ng/mL (ref 0.0–4.7)

## 2021-09-11 ENCOUNTER — Inpatient Hospital Stay: Payer: BC Managed Care – PPO | Admitting: Internal Medicine

## 2021-09-11 ENCOUNTER — Inpatient Hospital Stay: Payer: BC Managed Care – PPO

## 2021-09-19 ENCOUNTER — Encounter: Payer: Self-pay | Admitting: Emergency Medicine

## 2021-09-19 ENCOUNTER — Emergency Department
Admission: EM | Admit: 2021-09-19 | Discharge: 2021-09-19 | Disposition: A | Discharge status: Home / Self Care | Payer: BC Managed Care – PPO | Attending: Emergency Medicine | Admitting: Emergency Medicine

## 2021-09-19 ENCOUNTER — Other Ambulatory Visit: Payer: Self-pay

## 2021-09-19 DIAGNOSIS — J45909 Unspecified asthma, uncomplicated: Secondary | ICD-10-CM | POA: Insufficient documentation

## 2021-09-19 DIAGNOSIS — R5383 Other fatigue: Secondary | ICD-10-CM | POA: Diagnosis not present

## 2021-09-19 DIAGNOSIS — Z85118 Personal history of other malignant neoplasm of bronchus and lung: Secondary | ICD-10-CM | POA: Insufficient documentation

## 2021-09-19 DIAGNOSIS — Z85038 Personal history of other malignant neoplasm of large intestine: Secondary | ICD-10-CM | POA: Insufficient documentation

## 2021-09-19 DIAGNOSIS — D649 Anemia, unspecified: Secondary | ICD-10-CM | POA: Insufficient documentation

## 2021-09-19 DIAGNOSIS — E1140 Type 2 diabetes mellitus with diabetic neuropathy, unspecified: Secondary | ICD-10-CM | POA: Diagnosis not present

## 2021-09-19 DIAGNOSIS — I10 Essential (primary) hypertension: Secondary | ICD-10-CM | POA: Diagnosis not present

## 2021-09-19 LAB — BASIC METABOLIC PANEL
Anion gap: 9 (ref 5–15)
BUN: 16 mg/dL (ref 8–23)
CO2: 24 mmol/L (ref 22–32)
Calcium: 9.5 mg/dL (ref 8.9–10.3)
Chloride: 97 mmol/L — ABNORMAL LOW (ref 98–111)
Creatinine, Ser: 0.89 mg/dL (ref 0.61–1.24)
GFR, Estimated: >60 mL/min (ref >60)
Glucose, Bld: 257 mg/dL — ABNORMAL HIGH (ref 70–99)
Potassium: 4.8 mmol/L (ref 3.5–5.1)
Sodium: 130 mmol/L — ABNORMAL LOW (ref 135–145)

## 2021-09-19 LAB — CBC
HCT: 37.9 % — ABNORMAL LOW (ref 39.0–52.0)
Hemoglobin: 12.4 g/dL — ABNORMAL LOW (ref 13.0–17.0)
MCH: 27 pg (ref 26.0–34.0)
MCHC: 32.7 g/dL (ref 30.0–36.0)
MCV: 82.4 fL (ref 80.0–100.0)
Platelets: 187 10*3/uL (ref 150–400)
RBC: 4.6 MIL/uL (ref 4.22–5.81)
RDW: 14.8 % (ref 11.5–15.5)
WBC: 6.6 10*3/uL (ref 4.0–10.5)
nRBC: 0 % (ref 0.0–0.2)

## 2021-09-19 NOTE — ED Triage Notes (Signed)
Pt reports that he is a cancer pt not getting any treatments has not had them for a year. Pts  PMD MD took his blood work for medication refill and they called this am with K+ is 6.8 They called him to come to the hospital for treatment.  ?

## 2021-09-19 NOTE — Discharge Instructions (Signed)
Please follow-up with your oncologist and your primary doctor.  Your potassium was normal today. ?

## 2021-09-19 NOTE — ED Provider Notes (Signed)
[facility] ? ?[facility] ?Provider Note ? ? ? Event Date/Time  ? First MD Initiated Contact with Patient 09/19/21 1329   ?  (approximate) ? ? ?History  ? ?Abnormal Lab and Weakness ? ? ?HPI ? ?Kevin Mckay is a 71 y.o. male past medical history of asthma, hypertension colon cancer, lung cancer who presents.  Patient tells me that he saw his primary care doctor for generalized fatigue and to have refills of his metformin.  He had routine blood work done and was called today and told that his potassium was 6.8.  Patient notes that for the last 6 months or so he has felt fatigued and occasionally lightheaded upon standing denies chest pain.  Has some chronic dyspnea but no new symptoms currently.  Also with some chronic abdominal pain again not new denies nausea vomiting fevers chills or urinary symptoms.  ?  ? ?Past Medical History:  ?Diagnosis Date  ? Asthma   ? Cancer [facility]   ? Metastatic small cell lung cancer  ? Cerebral aneurysm   ? Chronic painful diabetic neuropathy ([place])   ? Depression   ? Diabetes mellitus without complication ([place])   ? Essential hypertension   ? History of kidney stones   ? Occasional tremors   ? Tobacco use   ? ? ?Patient Active Problem List  ? Diagnosis Date Noted  ? Goals of care, counseling/discussion 11/05/2019  ? S/P right colectomy 05/04/2019  ? Cancer of ascending colon ([place])   ? Melena   ? Colonic mass   ? Lung mass   ? Iron deficiency 02/03/2019  ? 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 Mediastinal mass 01/09/2019  ? Neuropathy 08/28/2018  ? Tremor 08/28/2018  ? Abnormal brain MRI 08/11/2015  ? Essential hypertension 04/01/2015  ? Type 2 diabetes mellitus with diabetic neuropathy ([place]) 04/01/2015  ? Subdural hematoma 04/01/2015  ? Tobacco use 04/01/2015  ? ? ? ?Physical Exam  ?Triage Vital Signs: ?ED Triage Vitals  ?Enc Vitals Group  ?   BP 09/19/21 1237 (!) 165/70  ?   Pulse Rate 09/19/21 1237 75  ?    Resp 09/19/21 1237 20  ?   Temp 09/19/21 1237 98.3 ?F (36.8 ?C)  ?   Temp Source 09/19/21 1237 Oral  ?   SpO2 09/19/21 1237 100 %  ?   Weight 09/19/21 1238 136 lb (61.7 kg)  ?   Height 09/19/21 1238 5\' 8"  (1.727 m)  ?   Head Circumference --   ?   Peak Flow --   ?   Pain Score 09/19/21 1237 3  ?   Pain Loc --   ?   Pain Edu? --   ?   Excl. in [place]? --   ? ? ?Most recent vital signs: ?Vitals:  ? 09/19/21 1237  ?BP: (!) 165/70  ?Pulse: 75  ?Resp: 20  ?Temp: 98.3 ?F (36.8 ?C)  ?SpO2: 100%  ? ? ? ?General: Awake, no distress.  ?CV:  Good peripheral perfusion.  ?Resp:  Normal effort. Exp wheeze ?Abd:  No distention.  ?Neuro:             Awake, Alert, Oriented x 3  ?Other:   ? ? ?ED Results / Procedures / Treatments  ?Labs ?(all labs ordered are listed, but only abnormal results are displayed) ?Labs Reviewed  ?BASIC METABOLIC PANEL - Abnormal; Notable for the following components:  ?    Result Value  ?  Sodium 130 (*)   ? Chloride 97 (*)   ? Glucose, Bld 257 (*)   ? All other components within normal limits  ?CBC - Abnormal; Notable for the following components:  ? Hemoglobin 12.4 (*)   ? HCT 37.9 (*)   ? All other components within normal limits  ? ? ? ?EKG ? ?EKG interpretation performed by myself: NSR, nml axis, nml intervals, no acute ischemic changes ? ? ? ?RADIOLOGY ? ? ? ?PROCEDURES: ? ?Critical Care performed: No ? ?Procedures ? ? ? ?MEDICATIONS ORDERED IN ED: ?Medications - No data to display ? ? ?IMPRESSION / MDM / ASSESSMENT AND PLAN / ED COURSE  ?I reviewed the triage vital signs and the nursing notes. ?             ?               ? ?Differential diagnosis includes, but is not limited to, lab error, acute renal failure ? ?71 yo male presents with concern for hyperkalemia on outpatient labs. He went to his PCP primarily for refill of his metformin and also for subacute fatigue, had labs checked an was told to come to the ED bc K was 6.8. EKG without signs of hyperK and potassium here is 4.8- was likely hemolyzed  before. Patient's EKG does not show ischemic changes. He has mild anemia at 12.7. BG elevated around 250, which is his baseline he tells me. We discussed talking with PCP about glucose management, and he is planning on seeing his oncologist next month which I think is most important given his other symptoms of fatigue. However, he has no new symptoms today, has no CP or SOB, I think he is appropriate for outpatient work-up.  ?FINAL CLINICAL IMPRESSION(S) / ED DIAGNOSES  ? ?Final diagnoses:  ?Other fatigue  ? ? ? ?Rx / DC Orders  ? ?ED Discharge Orders   ? ? None  ? ?  ? ? ? ?Note:  This document was prepared using Dragon voice recognition software and may include unintentional dictation errors. ?  ?Rada Hay, MD ?09/19/21 1711 ? ?

## 2021-10-03 ENCOUNTER — Encounter: Payer: Self-pay | Admitting: Internal Medicine

## 2021-10-03 ENCOUNTER — Inpatient Hospital Stay (HOSPITAL_BASED_OUTPATIENT_CLINIC_OR_DEPARTMENT_OTHER): Payer: BC Managed Care – PPO | Admitting: Internal Medicine

## 2021-10-03 ENCOUNTER — Inpatient Hospital Stay: Payer: BC Managed Care – PPO | Attending: Internal Medicine

## 2021-10-03 VITALS — BP 129/62 | HR 80 | Temp 98.2°F | Resp 20 | Ht 68.0 in | Wt 131.0 lb

## 2021-10-03 DIAGNOSIS — Z794 Long term (current) use of insulin: Secondary | ICD-10-CM | POA: Insufficient documentation

## 2021-10-03 DIAGNOSIS — Z85118 Personal history of other malignant neoplasm of bronchus and lung: Secondary | ICD-10-CM | POA: Diagnosis present

## 2021-10-03 DIAGNOSIS — C781 Secondary malignant neoplasm of mediastinum: Secondary | ICD-10-CM | POA: Diagnosis not present

## 2021-10-03 DIAGNOSIS — C801 Malignant (primary) neoplasm, unspecified: Secondary | ICD-10-CM

## 2021-10-03 DIAGNOSIS — E871 Hypo-osmolality and hyponatremia: Secondary | ICD-10-CM | POA: Diagnosis not present

## 2021-10-03 DIAGNOSIS — Z79899 Other long term (current) drug therapy: Secondary | ICD-10-CM | POA: Insufficient documentation

## 2021-10-03 DIAGNOSIS — E1165 Type 2 diabetes mellitus with hyperglycemia: Secondary | ICD-10-CM | POA: Insufficient documentation

## 2021-10-03 DIAGNOSIS — I10 Essential (primary) hypertension: Secondary | ICD-10-CM | POA: Insufficient documentation

## 2021-10-03 DIAGNOSIS — Z85038 Personal history of other malignant neoplasm of large intestine: Secondary | ICD-10-CM | POA: Diagnosis present

## 2021-10-03 DIAGNOSIS — F17210 Nicotine dependence, cigarettes, uncomplicated: Secondary | ICD-10-CM | POA: Diagnosis not present

## 2021-10-03 DIAGNOSIS — Z9221 Personal history of antineoplastic chemotherapy: Secondary | ICD-10-CM | POA: Insufficient documentation

## 2021-10-03 DIAGNOSIS — Z7951 Long term (current) use of inhaled steroids: Secondary | ICD-10-CM | POA: Diagnosis not present

## 2021-10-03 DIAGNOSIS — C182 Malignant neoplasm of ascending colon: Secondary | ICD-10-CM | POA: Diagnosis not present

## 2021-10-03 DIAGNOSIS — Z7984 Long term (current) use of oral hypoglycemic drugs: Secondary | ICD-10-CM | POA: Insufficient documentation

## 2021-10-03 DIAGNOSIS — Z95828 Presence of other vascular implants and grafts: Secondary | ICD-10-CM

## 2021-10-03 LAB — CBC WITH DIFFERENTIAL/PLATELET
Abs Immature Granulocytes: 0.02 10*3/uL (ref 0.00–0.07)
Basophils Absolute: 0.1 10*3/uL (ref 0.0–0.1)
Basophils Relative: 1 %
Eosinophils Absolute: 0.2 10*3/uL (ref 0.0–0.5)
Eosinophils Relative: 3 %
HCT: 38.3 % — ABNORMAL LOW (ref 39.0–52.0)
Hemoglobin: 12.8 g/dL — ABNORMAL LOW (ref 13.0–17.0)
Immature Granulocytes: 0 %
Lymphocytes Relative: 24 %
Lymphs Abs: 1.7 10*3/uL (ref 0.7–4.0)
MCH: 27.5 pg (ref 26.0–34.0)
MCHC: 33.4 g/dL (ref 30.0–36.0)
MCV: 82.2 fL (ref 80.0–100.0)
Monocytes Absolute: 0.6 10*3/uL (ref 0.1–1.0)
Monocytes Relative: 8 %
Neutro Abs: 4.5 10*3/uL (ref 1.7–7.7)
Neutrophils Relative %: 64 %
Platelets: 185 10*3/uL (ref 150–400)
RBC: 4.66 MIL/uL (ref 4.22–5.81)
RDW: 15.2 % (ref 11.5–15.5)
WBC: 7.1 10*3/uL (ref 4.0–10.5)
nRBC: 0 % (ref 0.0–0.2)

## 2021-10-03 LAB — COMPREHENSIVE METABOLIC PANEL
ALT: 17 U/L (ref 0–44)
AST: 23 U/L (ref 15–41)
Albumin: 4.3 g/dL (ref 3.5–5.0)
Alkaline Phosphatase: 60 U/L (ref 38–126)
Anion gap: 12 (ref 5–15)
BUN: 20 mg/dL (ref 8–23)
CO2: 22 mmol/L (ref 22–32)
Calcium: 9.4 mg/dL (ref 8.9–10.3)
Chloride: 99 mmol/L (ref 98–111)
Creatinine, Ser: 1.29 mg/dL — ABNORMAL HIGH (ref 0.61–1.24)
GFR, Estimated: >60 mL/min (ref >60)
Glucose, Bld: 228 mg/dL — ABNORMAL HIGH (ref 70–99)
Potassium: 3.8 mmol/L (ref 3.5–5.1)
Sodium: 133 mmol/L — ABNORMAL LOW (ref 135–145)
Total Bilirubin: 0.8 mg/dL (ref 0.3–1.2)
Total Protein: 7.7 g/dL (ref 6.5–8.1)

## 2021-10-03 MED ORDER — HEPARIN SOD (PORK) LOCK FLUSH 100 UNIT/ML IV SOLN
500.0000 [IU] | Freq: Once | INTRAVENOUS | Status: AC
  Administered 2021-10-03: 500 [IU] via INTRAVENOUS
  Filled 2021-10-03: qty 5

## 2021-10-03 MED ORDER — LIDOCAINE-PRILOCAINE 2.5-2.5 % EX CREA
1.0000 | TOPICAL_CREAM | CUTANEOUS | 0 refills | Status: AC | PRN
Start: 2021-10-03 — End: ?

## 2021-10-03 MED ORDER — SODIUM CHLORIDE 0.9% FLUSH
10.0000 mL | INTRAVENOUS | Status: DC | PRN
  Administered 2021-10-03: 10 mL via INTRAVENOUS
  Filled 2021-10-03: qty 10

## 2021-10-03 MED ORDER — HYDROCODONE-ACETAMINOPHEN 5-325 MG PO TABS
1.0000 | ORAL_TABLET | Freq: Two times a day (BID) | ORAL | 0 refills | Status: DC | PRN
Start: 2021-10-03 — End: 2021-12-02

## 2021-10-03 NOTE — Assessment & Plan Note (Addendum)
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 JAN 17th, 2023-no evidence of progression of subcapsular liver lesion/biopsy-proven colon cancer; no evidence of new disease.  However-given the worsening left quadrant abdominal pain/weight loss-question recurrence.  Recommended CT scan of chest abdomen pelvis stat. ? ?# LEFT hilar/LUL-  Limited stage small cell cancer-s/p chemo-RT; JAN 17th, 2023-CT chest- Left perihilar treatment changes no evidence of local recurrence--given above new symptoms-question recurrence.  Recommend scan as above. ? ?#Weight loss/fatigue/ Left upper quadrant abdominal pain x4 days; no constipation question secondary progressive malignancy versus others.-CT scan as above. ? ?# Anemia- secondary- sec to chemo-hemoglobin on 10-11-STABLE.  ? ?#Hyponatremia sodium 132- STABLE.  ? ?# Back pain- MRI lumbar spine- July 2021-NEG for mets; severe arthritic changes; spinal canal stenosis-stable hydrocodone 1 day prn -STABLE. ; refilled. ? ?# Poorly controlled diabetes blood sugars-better controlled as per patients-overall stable FBG-288- ?  ?# HTN- 120s/ 70-OFF[[ Avastin]- continue current anti-HTNs. - stable ? ?# DISPOSITION: Will call with results. ?# CT CAP STAT ?# follow up TBD-Dr.B ? ? ?

## 2021-10-03 NOTE — Progress Notes (Signed)
[facility] ?CONSULT NOTE ? ?Patient Care Team: ?Laneta Simmers, NP (Inactive) as PCP - General (Nurse Practitioner) ?Telford Nab, RN as Equities trader ?Clent Jacks, RN as Oncology Nurse Navigator ? ?CHIEF COMPLAINTS/PURPOSE OF CONSULTATION: Lung cancer/colon cancer ? ? ?Oncology History Overview Note  ?# July 2020- SMALL CELL CA METASTATIC TO MEDIASTINAL LN [open Bx; Dr.Oaks]; TxN2M0; July 2nd 2020-PET- 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  MRI brain negative ? ?# aug 17th 2020-carbo etoposide-RT [? 8/19]; #4 cycle carbo-Etop [finished dec 37SE,8315] ? ?#August 2020 iron deficient anemia-question etiology; IV Feraheme [no colonoscopy] ? ?# OCT 2020- colo/ Cecal adeno ca; MRI liver- 1 cm enhancing lesion "metastasis" [Dr.Anna/Dr.Cannon]-difficulty biopsy.  November 02-2019 right hemicolectomy- STAGE III [pT3pN1 (2/13LN)]- HOLD adjuvant therapy. MMR- INTACT/LOW ? ?#May 2021-liver biopsy-possible adenocarcinoma; colorectal origin.  Stage IV colon cancer ? ?#May 24-2021-FOLFOX with Avastin; AUG 31st,2021- STABLE liver lesion; SEP 1st, 2021- Discontinue LV+Ox sec to severe fatigue/PN; cont 5FU CIV + Avastin ? ?# COPD/  DM-2- on OHA/ smoker/ PVD/peripheral neuropathy ? ?# History of alcohol abuse/quit 2014/ Hx of abdominal trauma [at 21y] ? ?#July 2021-foundation 1 NGS [liver metastases]- RAS WILD TYPE ** ? ?# Jan 1st, 2022-Covid infection [prior vaccination; monoclonal infusion-not hospitalized] ? ?# DIAGNOSIS:  ? ?# SMALL CELL CA-limited stage-status post chemoradiation;  ? ?# colon cancer-stage IV-solitary liver lesion ? ?GOALS: Control ? ?CURRENT/MOST RECENT THERAPY : FOLFOX with Avastin ?  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  ?02/09/2019 - 06/24/2019 Chemotherapy  ? The patient had palonosetron (ALOXI)  injection 0.25 mg, 0.25 mg, Intravenous,  Once, 4 of 4 cycles ?Administration: 0.25 mg (02/09/2019), 0.25 mg (03/03/2019), 0.25 mg (06/02/2019), 0.25 mg (06/22/2019) ?CARBOplatin (PARAPLATIN) 410 mg in sodium chloride 0.9 % 250 mL chemo infusion, 410 mg (100 % of original dose 414 mg), Intravenous,  Once, 4 of 4 cycles ?Dose modification:   (original dose 414 mg, Cycle 1) ?Administration: 410 mg (02/09/2019), 410 mg (03/03/2019), 410 mg (06/02/2019), 410 mg (06/22/2019) ?etoposide (VEPESID) 180 mg in sodium chloride 0.9 % 500 mL chemo infusion, 100 mg/m2 = 180 mg, Intravenous,  Once, 4 of 4 cycles ?Administration: 180 mg (02/09/2019), 180 mg (02/10/2019), 180 mg (02/11/2019), 180 mg (03/03/2019), 180 mg (03/04/2019), 180 mg (03/05/2019), 180 mg (06/02/2019), 180 mg (06/03/2019), 180 mg (06/04/2019), 180 mg (06/22/2019), 180 mg (06/23/2019), 180 mg (06/24/2019) ?fosaprepitant (EMEND) 150 mg, dexamethasone (DECADRON) 6 mg in sodium chloride 0.9 % 145 mL IVPB, , Intravenous,  Once, 2 of 2 cycles ?Administration:  (06/02/2019),  (06/22/2019) ? ? for chemotherapy treatment.  ? ?  ?Cancer of ascending colon ([place])  ?05/04/2019 Initial Diagnosis  ? Cancer of ascending colon [facility]) ?  ?11/30/2019 -  Chemotherapy  ?  Patient is on Treatment Plan: COLORECTAL FOLFOX + BEVACIZUMAB Q14D ? ?  ? ?  ? ?HISTORY OF PRESENTING ILLNESS: Alone.  Ambulating independently. ? ?Kevin Mckay 71 y.o.  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 ? ?Patient complains overall feeling poorly.  Complains of weight loss.  Complains of worsening shortness of breath especially with coughing.  Although unfortunately continues to smoke.  Poor appetite.  Extreme worsening fatigue. Patient denies any blood in stools or black or stools.  Complains of intermittent dizziness.  No falls. ? ? ?Review of  Systems  ?Constitutional:  Positive for malaise/fatigue and weight loss. Negative for chills, diaphoresis and fever.  ?HENT:  Negative for nosebleeds and sore throat.   ?Eyes:  Negative for double vision.  ?Respiratory:  Negative for hemoptysis and wheezing.   ?Cardiovascular:  Negative for chest pain, palpitations and orthopnea.  ?Gastrointestinal:  Negative for abdominal pain, blood in stool, constipation, diarrhea, heartburn, melena, nausea and vomiting.  ?Genitourinary:  Negative for dysuria, frequency and urgency.  ?Musculoskeletal:  Positive for back pain and joint pain.  ?Skin: Negative.  Negative for itching and rash.  ?Neurological:  Positive for tingling. Negative for dizziness, focal weakness and weakness.  ?Endo/Heme/Allergies:  Does not bruise/bleed easily.  ?Psychiatric/Behavioral:  Negative for depression. The patient is not nervous/anxious and does not have insomnia.    ? ?MEDICAL HISTORY:  ?Past Medical History:  ?Diagnosis Date  ? Asthma   ? Cancer [facility])   ? Metastatic small cell lung cancer  ? Cerebral aneurysm   ? Chronic painful diabetic neuropathy ([place])   ? Depression   ? Diabetes mellitus without complication ([place])   ? Essential hypertension   ? History of kidney stones   ? Occasional tremors   ? Tobacco use   ? ? ?SURGICAL HISTORY: ?Past Surgical History:  ?Procedure Laterality Date  ? ABDOMINAL SURGERY    ? age 43. trauma surgery due to forklift injury- liver and spleen  ? COLONOSCOPY WITH PROPOFOL N/A 03/27/2019  ? Procedure: COLONOSCOPY WITH PROPOFOL;  Surgeon: Jonathon Bellows, MD;  Location: [facility] ENDOSCOPY;  Service: Gastroenterology;  Laterality: N/A;  ? COLOSTOMY REVISION Right 05/04/2019  ? Procedure: COLON RESECTION RIGHT-right hemicolectomy-open;  Surgeon: Fredirick Maudlin, MD;  Location: [facility] ORS;  Service: General;  Laterality: Right;  ? ESOPHAGOGASTRODUODENOSCOPY (EGD) WITH PROPOFOL N/A 03/27/2019  ? Procedure: ESOPHAGOGASTRODUODENOSCOPY (EGD) WITH PROPOFOL;  Surgeon: Jonathon Bellows, MD;   Location: [facility] ENDOSCOPY;  Service: Gastroenterology;  Laterality: N/A;  ? IR GENERIC HISTORICAL  05/31/2016  ? IR ANGIO VERTEBRAL SEL VERTEBRAL BILAT MOD SED 05/31/2016 Consuella Lose, MD [facility]-INTERV RAD  ? IR GENERIC HISTORICAL  05/31/2016  ? IR ANGIO INTRA EXTRACRAN SEL INTERNAL CAROTID BILAT MOD SED 05/31/2016 Consuella Lose, MD [facility]INTER[facility]  ? PORTACATH PLACEMENT Right 02/04/2019  ? Procedure: INSERTION PORT-A-CATH;  Surgeon: [name], MD;  Location: [facility] ORS;  Service: General;  Laterality: Right;  ? THORACOTOMY Left 01/22/2019  ? Procedure: PRE OP BRONCH LEFT ANTERIOR THORACOTOMY WITH BIOPSY OF MEDIASTINAL MASS;  Surgeon: [name], MD;  Location: [facility] ORS;  Service: General;  Laterality: Left;  ? ? ?SOCIAL HISTORY: ?Social History  ? ?Socioeconomic History  ? Marital status: Married  ?  Spouse name: Not on file  ? Number of children: Not on file  ? Years of education: Not on file  ? Highest education level: Not on file  ?Occupational History  ? Not on file  ?Tobacco Use  ? Smoking status: Every Day  ?  Packs/day: 0.50  ?  Years: 50.00  ?  Pack years: 25.00  ?  Types: Cigarettes  ? Smokeless tobacco: Never  ?Vaping Use  ? Vaping Use: Never used  ?Substance and Sexual Activity  ? Alcohol use: No  ? Drug use: Yes  ?  Types: Barbituates, Marijuana  ? Sexual activity: Not Currently  ?Other Topics Concern  ? Not on file  ?Social History Narrative  ? Lives in [place]; wife/ son/grandson [custody]; smoker; vending business; hx of  alcoholism- quit at 89.   ? ?Social Determinants of Health  ? ?Financial Resource Strain: Not on file  ?Food Insecurity: Not on file  ?Transportation Needs: Not on file  ?Physical Activity: Not on file  ?Stress: Not on file  ?Social Connections: Not on file  ?Intimate Partner Violence: Not on file  ? ? ?FAMILY HISTORY: ?Family History  ?Problem Relation Age of Onset  ? Lymphoma Father   ? Liver cancer Paternal Uncle   ? Lung cancer Paternal Uncle   ? Lung cancer Maternal Uncle    ? ? ?ALLERGIES:  is allergic to ferumoxytol, amoxicillin, and codeine. ? ?MEDICATIONS:  ?Current Outpatient Medications  ?Medication Sig Dispense Refill  ? amLODipine (NORVASC) 10 MG tablet Take 10 mg by mout

## 2021-10-03 NOTE — Progress Notes (Signed)
Patient reports not feeling well for the past 4 days that includes a decrease in appetite, left side rib pain (8/10 pain), increased SOBr (gradual decline in wt) , and generalized weakness.  Also waking up with night sweats for the past 2 months. ? ?Does have Hydrocodone at home but hasn't taken for the past 4-5 days ago (refill pended for MD to review).  Afraid to take because he vomited after taking other meds. ?

## 2021-10-04 ENCOUNTER — Other Ambulatory Visit: Payer: Self-pay

## 2021-10-04 ENCOUNTER — Ambulatory Visit
Admission: RE | Admit: 2021-10-04 | Discharge: 2021-10-04 | Disposition: A | Discharge status: Home / Self Care | Payer: BC Managed Care – PPO | Source: Ambulatory Visit | Attending: Internal Medicine | Admitting: Internal Medicine

## 2021-10-04 DIAGNOSIS — C801 Malignant (primary) neoplasm, unspecified: Secondary | ICD-10-CM | POA: Insufficient documentation

## 2021-10-04 DIAGNOSIS — C182 Malignant neoplasm of ascending colon: Secondary | ICD-10-CM | POA: Insufficient documentation

## 2021-10-04 DIAGNOSIS — C781 Secondary malignant neoplasm of mediastinum: Secondary | ICD-10-CM | POA: Diagnosis present

## 2021-10-04 LAB — CEA: CEA: 6.4 ng/mL — ABNORMAL HIGH (ref 0.0–4.7)

## 2021-10-04 MED ORDER — IOHEXOL 300 MG/ML  SOLN
75.0000 mL | Freq: Once | INTRAMUSCULAR | Status: AC | PRN
  Administered 2021-10-04: 75 mL via INTRAVENOUS

## 2021-10-06 ENCOUNTER — Telehealth: Payer: Self-pay | Admitting: Internal Medicine

## 2021-10-06 DIAGNOSIS — C182 Malignant neoplasm of ascending colon: Secondary | ICD-10-CM

## 2021-10-06 NOTE — Telephone Encounter (Signed)
Spoke to patient regarding fairly unremarkable results of the CT scan no evidence of any recurrent cancer or progression.  However CEA slightly elevated at 6. ? ?Recommend Prilosec twice a day 1 hour before meals//Maalox for 1 month.  OTC. ? ?Patient call us sooner if abdominal pain is worse/not resolved the next 1 to 2 weeks. ? ?Recommend follow-up in 1 month-MD labs CBC CMP port flush; CEA. ? ?Thanks ?GB ?

## 2021-10-09 NOTE — Addendum Note (Signed)
Addended by: Vanice Sarah on: 10/09/2021 09:53 AM ? ? Modules accepted: Orders ? ?

## 2021-10-09 NOTE — Telephone Encounter (Signed)
Please schedule as MD recommends. ?

## 2021-10-24 ENCOUNTER — Ambulatory Visit (INDEPENDENT_AMBULATORY_CARE_PROVIDER_SITE_OTHER): Payer: BC Managed Care – PPO

## 2021-10-24 ENCOUNTER — Other Ambulatory Visit (INDEPENDENT_AMBULATORY_CARE_PROVIDER_SITE_OTHER): Payer: Self-pay | Admitting: Nurse Practitioner

## 2021-10-24 ENCOUNTER — Encounter (INDEPENDENT_AMBULATORY_CARE_PROVIDER_SITE_OTHER): Payer: Self-pay | Admitting: Nurse Practitioner

## 2021-10-24 ENCOUNTER — Ambulatory Visit (INDEPENDENT_AMBULATORY_CARE_PROVIDER_SITE_OTHER): Payer: BC Managed Care – PPO | Admitting: Vascular Surgery

## 2021-10-24 VITALS — BP 154/72 | HR 70 | Resp 17 | Ht 68.0 in | Wt 134.0 lb

## 2021-10-24 DIAGNOSIS — I70211 Atherosclerosis of native arteries of extremities with intermittent claudication, right leg: Secondary | ICD-10-CM

## 2021-10-24 DIAGNOSIS — I70221 Atherosclerosis of native arteries of extremities with rest pain, right leg: Secondary | ICD-10-CM

## 2021-10-24 DIAGNOSIS — Z794 Long term (current) use of insulin: Secondary | ICD-10-CM

## 2021-10-24 DIAGNOSIS — C781 Secondary malignant neoplasm of mediastinum: Secondary | ICD-10-CM | POA: Diagnosis not present

## 2021-10-24 DIAGNOSIS — I10 Essential (primary) hypertension: Secondary | ICD-10-CM | POA: Diagnosis not present

## 2021-10-24 DIAGNOSIS — C801 Malignant (primary) neoplasm, unspecified: Secondary | ICD-10-CM

## 2021-10-24 DIAGNOSIS — E1140 Type 2 diabetes mellitus with diabetic neuropathy, unspecified: Secondary | ICD-10-CM

## 2021-10-25 DIAGNOSIS — I70219 Atherosclerosis of native arteries of extremities with intermittent claudication, unspecified extremity: Secondary | ICD-10-CM | POA: Insufficient documentation

## 2021-10-25 NOTE — Assessment & Plan Note (Signed)
ABIs today are stable at 0.74 on the right and 0.96 on the left.  His symptoms are significant, but he says he is still dealing with the lung cancer and does not really want to undergo any procedures.  As long as he does not have ulceration, infection, or gangrenous changes I am okay with continued conservative measures.  Return in 4 to 6 months with noninvasive studies. ?

## 2021-10-25 NOTE — Progress Notes (Signed)
? ? ?MRN : [id number] ? ?Kevin Mckay is a 71 y.o. (16-Oct-1950) male who presents with chief complaint of No chief complaint on file. ?. ? ?History of Present Illness: Patient returns today in follow up of of his claudication symptoms.  He continues to have claudication symptoms worse in the right leg than the left.  He denies any open wounds or infection.  He does have some pain that wakes him from night intermittently. ABIs today are stable at 0.74 on the right and 0.96 on the left.   ? ?Current Outpatient Medications  ?Medication Sig Dispense Refill  ? amLODipine (NORVASC) 10 MG tablet Take 10 mg by mouth every morning.     ? budesonide-formoterol (SYMBICORT) 160-4.5 MCG/ACT inhaler Inhale 2 puffs into the lungs 2 (two) times daily.    ? clopidogrel (PLAVIX) 75 MG tablet Take 1 tablet (75 mg total) by mouth daily. 30 tablet 6  ? desonide (DESOWEN) 0.05 % cream Apply 1 application topically 2 (two) times daily as needed.    ? DULoxetine (CYMBALTA) 30 MG capsule Take 30 mg by mouth every morning.     ? gabapentin (NEURONTIN) 800 MG tablet Take 800 mg by mouth 3 (three) times daily.    ? HYDROcodone-acetaminophen (NORCO/VICODIN) 5-325 MG tablet Take 1 tablet by mouth every 12 (twelve) hours as needed for moderate pain. 45 tablet 0  ? insulin glargine (LANTUS) 100 UNIT/ML injection Inject into the skin daily. Units based By sliding scale    ? lidocaine-prilocaine (EMLA) cream Apply 1 application. topically as needed. 30-45 mins prior to port access. 30 g 0  ? lisinopril-hydrochlorothiazide (ZESTORETIC) 10-12.5 MG tablet Take 1 tablet by mouth daily.    ? lovastatin (MEVACOR) 20 MG tablet Take 20 mg by mouth every evening.    ? metFORMIN (GLUCOPHAGE) 1000 MG tablet Take 1,000 mg by mouth 2 (two) times daily with a meal.     ? omeprazole (PRILOSEC) 40 MG capsule Take 1 capsule (40 mg total) by mouth daily. 90 capsule 1  ? ondansetron (ZOFRAN) 8 MG tablet Take 1 tablet (8 mg total) by mouth every 8 (eight) hours as  needed for nausea or vomiting (start 3 days; after chemo). 40 tablet 1  ? polyethylene glycol powder (MIRALAX) 17 GM/SCOOP powder Mix full container in 64 ounces of Gatorade or other clear liquid. NO RED Liquids 238 g 0  ? prazosin (MINIPRESS) 1 MG capsule Take 1 mg by mouth at bedtime.    ? pyridOXINE (VITAMIN B-6) 100 MG tablet Take 100 mg by mouth daily.    ? sitaGLIPtin (JANUVIA) 25 MG tablet Take 25 mg by mouth daily.    ? sodium chloride 1 g tablet 1 g daily.    ? Ipratropium-Albuterol (COMBIVENT) 20-100 MCG/ACT AERS respimat Inhale 1 puff into the lungs every 6 (six) hours as needed.    ? ?No current facility-administered medications for this visit.  ? ?Facility-Administered Medications Ordered in Other Visits  ?Medication Dose Route Frequency Provider Last Rate Last Admin  ? heparin lock flush 100 UNIT/ML injection           ? heparin lock flush 100 UNIT/ML injection           ? sodium chloride flush (NS) 0.9 % injection 10 mL  10 mL Intravenous PRN Cammie Sickle, MD   10 mL at 09/13/20 0943  ? sodium chloride flush (NS) 0.9 % injection 10 mL  10 mL Intravenous PRN Cammie Sickle, MD   10  mL at 07/13/21 0948  ? ? ?Past Medical History:  ?Diagnosis Date  ? Asthma   ? Cancer [facility])   ? Metastatic small cell lung cancer  ? Cerebral aneurysm   ? Chronic painful diabetic neuropathy ([place])   ? Depression   ? Diabetes mellitus without complication ([place])   ? Essential hypertension   ? History of kidney stones   ? Occasional tremors   ? Tobacco use   ? ? ?Past Surgical History:  ?Procedure Laterality Date  ? ABDOMINAL SURGERY    ? age 35. trauma surgery due to forklift injury- liver and spleen  ? COLONOSCOPY WITH PROPOFOL N/A 03/27/2019  ? Procedure: COLONOSCOPY WITH PROPOFOL;  Surgeon: Jonathon Bellows, MD;  Location: [facility] ENDOSCOPY;  Service: Gastroenterology;  Laterality: N/A;  ? COLOSTOMY REVISION Right 05/04/2019  ? Procedure: COLON RESECTION RIGHT-right hemicolectomy-open;  Surgeon: Fredirick Maudlin, MD;   Location: [facility] ORS;  Service: General;  Laterality: Right;  ? ESOPHAGOGASTRODUODENOSCOPY (EGD) WITH PROPOFOL N/A 03/27/2019  ? Procedure: ESOPHAGOGASTRODUODENOSCOPY (EGD) WITH PROPOFOL;  Surgeon: Jonathon Bellows, MD;  Location: [facility] ENDOSCOPY;  Service: Gastroenterology;  Laterality: N/A;  ? IR GENERIC HISTORICAL  05/31/2016  ? IR ANGIO VERTEBRAL SEL VERTEBRAL BILAT MOD SED 05/31/2016 Consuella Lose, MD [facility]-INTERV RAD  ? IR GENERIC HISTORICAL  05/31/2016  ? IR ANGIO INTRA EXTRACRAN SEL INTERNAL CAROTID BILAT MOD SED 05/31/2016 Consuella Lose, MD [facility]  ? PORTACATH PLACEMENT Right 02/04/2019  ? Procedure: INSERTION PORT-A-CATH;  Surgeon: [name], MD;  Location: [facility] ORS;  Service: General;  Laterality: Right;  ? THORACOTOMY Left 01/22/2019  ? Procedure: PRE OP BRONCH LEFT ANTERIOR THORACOTOMY WITH BIOPSY OF MEDIASTINAL MASS;  Surgeon: [name], MD;  Location: [facility] ORS;  Service: General;  Laterality: Left;  ? ? ? ?Social History  ? ?Tobacco Use  ? Smoking status: Every Day  ?  Packs/day: 0.50  ?  Years: 50.00  ?  Pack years: 25.00  ?  Types: Cigarettes  ? Smokeless tobacco: Never  ?Vaping Use  ? Vaping Use: Never used  ?Substance Use Topics  ? Alcohol use: No  ? Drug use: Yes  ?  Types: Barbituates, Marijuana  ? ? ? ? ?Family History  ?Problem Relation Age of Onset  ? Lymphoma Father   ? Liver cancer Paternal Uncle   ? Lung cancer Paternal Uncle   ? Lung cancer Maternal Uncle   ? ? ? ?Allergies  ?Allergen Reactions  ? Ferumoxytol Shortness Of Breath and Other (See Comments)  ?  Infusion reaction-shortness of breath/ tachycardia/hypotension.  ? Amoxicillin Hives  ?  Did it involve swelling of the face/tongue/throat, SOB, or low BP? No ?Did it involve sudden or severe rash/hives, skin peeling, or any reaction on the inside of your mouth or nose? Yes ?Did you need to seek medical attention at a hospital or doctor's office? No ?When did it last happen?~5 years ago ?If all above answers are ?NO?, may proceed  with cephalosporin use. ?  ? Codeine Nausea Only  ? ? ? ?REVIEW OF SYSTEMS (Negative unless checked) ? ?Constitutional: [] Weight loss  [] Fever  [] Chills ?Cardiac: [] Chest pain   [] Chest pressure   [] Palpitations   [] Shortness of breath when laying flat   [] Shortness of breath at rest   [] Shortness of breath with exertion. ?Vascular:  [] Pain in legs with walking   [] Pain in legs at rest   [] Pain in legs when laying flat   [x] Claudication   [] Pain in feet when walking  [] Pain in  feet at rest  [] Pain in feet when laying flat   [] History of DVT   [] Phlebitis   [] Swelling in legs   [] Varicose veins   [] Non-healing ulcers ?Pulmonary:   [] Uses home oxygen   [] Productive cough   [] Hemoptysis   [] Wheeze  [] COPD   [] Asthma ?Neurologic:  [] Dizziness  [] Blackouts   [] Seizures   [] History of stroke   [] History of TIA  [] Aphasia   [] Temporary blindness   [] Dysphagia   [] Weakness or numbness in arms   [] Weakness or numbness in legs ?Musculoskeletal:  [] Arthritis   [] Joint swelling   [] Joint pain   [] Low back pain ?Hematologic:  [] Easy bruising  [] Easy bleeding   [] Hypercoagulable state   [] Anemic   ?Gastrointestinal:  [] Blood in stool   [] Vomiting blood  [] Gastroesophageal reflux/heartburn   [] Abdominal pain ?Genitourinary:  [] Chronic kidney disease   [] Difficult urination  [] Frequent urination  [] Burning with urination   [] Hematuria ?Skin:  [] Rashes   [] Ulcers   [] Wounds ?Psychological:  [] History of anxiety   []  History of major depression. ? ?Physical Examination ? ?BP (!) 154/72 (BP Location: Right Arm)   Pulse 70   Resp 17   Ht 5\' 8"  (1.727 m)   Wt 134 lb (60.8 kg)   BMI 20.37 kg/m?  ?Gen:  WD/WN, NAD ?Head: [place]/AT, No temporalis wasting. ?Ear/Nose/Throat: Hearing grossly intact, nares w/o erythema or drainage ?Eyes: Conjunctiva clear. Sclera non-icteric ?Neck: Supple.  Trachea midline ?Pulmonary:  Good air movement, no use of accessory muscles.  ?Cardiac: RRR, no JVD ?Vascular:  ?Vessel Right Left  ?Radial Palpable  Palpable  ?    ?    ?    ?    ?    ?    ?PT 1+ Palpable 1+ Palpable  ?DP 1+ Palpable 2+ Palpable  ? ?Gastrointestinal: soft, non-tender/non-distended. No guarding/reflex.  ?Musculoskeletal: M/S 5/5 throughout.

## 2021-10-25 NOTE — Assessment & Plan Note (Signed)
blood glucose control important in reducing the progression of atherosclerotic disease. Also, involved in wound healing. On appropriate medications.  

## 2021-10-25 NOTE — Assessment & Plan Note (Signed)
Follows with oncology

## 2021-11-07 ENCOUNTER — Inpatient Hospital Stay: Payer: BC Managed Care – PPO | Admitting: Internal Medicine

## 2021-11-07 ENCOUNTER — Inpatient Hospital Stay: Payer: BC Managed Care – PPO | Attending: Internal Medicine

## 2021-11-07 VITALS — BP 153/79 | HR 72 | Temp 98.7°F | Ht 68.0 in | Wt 131.8 lb

## 2021-11-07 DIAGNOSIS — R42 Dizziness and giddiness: Secondary | ICD-10-CM | POA: Diagnosis not present

## 2021-11-07 DIAGNOSIS — F17210 Nicotine dependence, cigarettes, uncomplicated: Secondary | ICD-10-CM | POA: Diagnosis not present

## 2021-11-07 DIAGNOSIS — I10 Essential (primary) hypertension: Secondary | ICD-10-CM | POA: Diagnosis not present

## 2021-11-07 DIAGNOSIS — R197 Diarrhea, unspecified: Secondary | ICD-10-CM | POA: Diagnosis not present

## 2021-11-07 DIAGNOSIS — T451X5S Adverse effect of antineoplastic and immunosuppressive drugs, sequela: Secondary | ICD-10-CM | POA: Insufficient documentation

## 2021-11-07 DIAGNOSIS — Z7984 Long term (current) use of oral hypoglycemic drugs: Secondary | ICD-10-CM | POA: Diagnosis not present

## 2021-11-07 DIAGNOSIS — Z923 Personal history of irradiation: Secondary | ICD-10-CM | POA: Diagnosis not present

## 2021-11-07 DIAGNOSIS — Z79899 Other long term (current) drug therapy: Secondary | ICD-10-CM | POA: Diagnosis not present

## 2021-11-07 DIAGNOSIS — E871 Hypo-osmolality and hyponatremia: Secondary | ICD-10-CM | POA: Diagnosis not present

## 2021-11-07 DIAGNOSIS — C787 Secondary malignant neoplasm of liver and intrahepatic bile duct: Secondary | ICD-10-CM | POA: Diagnosis present

## 2021-11-07 DIAGNOSIS — R5383 Other fatigue: Secondary | ICD-10-CM | POA: Diagnosis not present

## 2021-11-07 DIAGNOSIS — C182 Malignant neoplasm of ascending colon: Secondary | ICD-10-CM | POA: Diagnosis present

## 2021-11-07 DIAGNOSIS — Z9221 Personal history of antineoplastic chemotherapy: Secondary | ICD-10-CM | POA: Insufficient documentation

## 2021-11-07 DIAGNOSIS — R519 Headache, unspecified: Secondary | ICD-10-CM | POA: Diagnosis not present

## 2021-11-07 DIAGNOSIS — Z85118 Personal history of other malignant neoplasm of bronchus and lung: Secondary | ICD-10-CM | POA: Insufficient documentation

## 2021-11-07 DIAGNOSIS — D6481 Anemia due to antineoplastic chemotherapy: Secondary | ICD-10-CM | POA: Diagnosis not present

## 2021-11-07 DIAGNOSIS — R634 Abnormal weight loss: Secondary | ICD-10-CM | POA: Diagnosis not present

## 2021-11-07 DIAGNOSIS — R55 Syncope and collapse: Secondary | ICD-10-CM | POA: Diagnosis not present

## 2021-11-07 DIAGNOSIS — Z95828 Presence of other vascular implants and grafts: Secondary | ICD-10-CM

## 2021-11-07 DIAGNOSIS — R5381 Other malaise: Secondary | ICD-10-CM | POA: Insufficient documentation

## 2021-11-07 DIAGNOSIS — E1165 Type 2 diabetes mellitus with hyperglycemia: Secondary | ICD-10-CM | POA: Diagnosis not present

## 2021-11-07 LAB — CBC WITH DIFFERENTIAL/PLATELET
Abs Immature Granulocytes: 0.02 10*3/uL (ref 0.00–0.07)
Basophils Absolute: 0.1 10*3/uL (ref 0.0–0.1)
Basophils Relative: 1 %
Eosinophils Absolute: 0.4 10*3/uL (ref 0.0–0.5)
Eosinophils Relative: 7 %
HCT: 36.7 % — ABNORMAL LOW (ref 39.0–52.0)
Hemoglobin: 12.2 g/dL — ABNORMAL LOW (ref 13.0–17.0)
Immature Granulocytes: 0 %
Lymphocytes Relative: 18 %
Lymphs Abs: 1.2 10*3/uL (ref 0.7–4.0)
MCH: 27.4 pg (ref 26.0–34.0)
MCHC: 33.2 g/dL (ref 30.0–36.0)
MCV: 82.3 fL (ref 80.0–100.0)
Monocytes Absolute: 0.5 10*3/uL (ref 0.1–1.0)
Monocytes Relative: 8 %
Neutro Abs: 4.4 10*3/uL (ref 1.7–7.7)
Neutrophils Relative %: 66 %
Platelets: 175 10*3/uL (ref 150–400)
RBC: 4.46 MIL/uL (ref 4.22–5.81)
RDW: 15.7 % — ABNORMAL HIGH (ref 11.5–15.5)
WBC: 6.7 10*3/uL (ref 4.0–10.5)
nRBC: 0 % (ref 0.0–0.2)

## 2021-11-07 LAB — COMPREHENSIVE METABOLIC PANEL
ALT: 16 U/L (ref 0–44)
AST: 20 U/L (ref 15–41)
Albumin: 3.8 g/dL (ref 3.5–5.0)
Alkaline Phosphatase: 61 U/L (ref 38–126)
Anion gap: 11 (ref 5–15)
BUN: 17 mg/dL (ref 8–23)
CO2: 24 mmol/L (ref 22–32)
Calcium: 9.2 mg/dL (ref 8.9–10.3)
Chloride: 99 mmol/L (ref 98–111)
Creatinine, Ser: 1.1 mg/dL (ref 0.61–1.24)
GFR, Estimated: >60 mL/min (ref >60)
Glucose, Bld: 231 mg/dL — ABNORMAL HIGH (ref 70–99)
Potassium: 4.6 mmol/L (ref 3.5–5.1)
Sodium: 134 mmol/L — ABNORMAL LOW (ref 135–145)
Total Bilirubin: 0.6 mg/dL (ref 0.3–1.2)
Total Protein: 7.3 g/dL (ref 6.5–8.1)

## 2021-11-07 MED ORDER — SODIUM CHLORIDE 0.9% FLUSH
10.0000 mL | Freq: Once | INTRAVENOUS | Status: AC
  Administered 2021-11-07: 10 mL via INTRAVENOUS
  Filled 2021-11-07: qty 10

## 2021-11-07 MED ORDER — HEPARIN SOD (PORK) LOCK FLUSH 100 UNIT/ML IV SOLN
500.0000 [IU] | Freq: Once | INTRAVENOUS | Status: AC
  Administered 2021-11-07: 500 [IU] via INTRAVENOUS
  Filled 2021-11-07: qty 5

## 2021-11-07 NOTE — Progress Notes (Signed)
Pt states he has been getting dizzy and passing out. 4-5 times in one day of  last week. Having diarrhea. Abdominal pain, and weakness. ?

## 2021-11-07 NOTE — Progress Notes (Signed)
Kevin Mckay ?CONSULT NOTE ? ?Patient Care Team: ?Kevin Limerick, PA as PCP - General (Physician Assistant) ?Kevin Nab, RN as Equities trader ?Kevin Jacks, RN as Oncology Nurse Navigator ? ?CHIEF COMPLAINTS/PURPOSE OF CONSULTATION: Lung cancer/colon cancer ? ? ?Oncology History Overview Note  ?# July 2020- SMALL CELL CA METASTATIC TO MEDIASTINAL LN [open Bx; Dr.Oaks]; TxN2M0; July 2nd 2020-PET- 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  MRI brain negative ? ?# aug 17th 2020-carbo etoposide-RT [? 8/19]; #4 cycle carbo-Etop [finished dec 30QM,5784] ? ?#August 2020 iron deficient anemia-question etiology; IV Feraheme [no colonoscopy] ? ?# OCT 2020- colo/ Cecal adeno ca; MRI liver- 1 cm enhancing lesion "metastasis" [Dr.Anna/Dr.Cannon]-difficulty biopsy.  November 02-2019 right hemicolectomy- STAGE III [pT3pN1 (2/13LN)]- HOLD adjuvant therapy. MMR- INTACT/LOW ? ?#May 2021-liver biopsy-possible adenocarcinoma; colorectal origin.  Stage IV colon cancer ? ?#May 24-2021-FOLFOX with Avastin; AUG 31st,2021- STABLE liver lesion; SEP 1st, 2021- Discontinue LV+Ox sec to severe fatigue/PN; cont 5FU CIV + Avastin ? ?# COPD/  DM-2- on OHA/ smoker/ PVD/peripheral neuropathy ? ?# History of alcohol abuse/quit 2014/ Hx of abdominal trauma [at 21y] ? ?#July 2021-foundation 1 NGS [liver metastases]- RAS WILD TYPE ** ? ?# Jan 1st, 2022-Covid infection [prior vaccination; monoclonal infusion-not hospitalized] ? ?# DIAGNOSIS:  ? ?# SMALL CELL CA-limited stage-status post chemoradiation;  ? ?# colon cancer-stage IV-solitary liver lesion ? ?GOALS: Control ? ?CURRENT/MOST RECENT THERAPY : FOLFOX with Avastin ?  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  ?02/09/2019 - 06/24/2019 Chemotherapy  ? The patient had palonosetron (ALOXI) injection 0.25  mg, 0.25 mg, Intravenous,  Once, 4 of 4 cycles ?Administration: 0.25 mg (02/09/2019), 0.25 mg (03/03/2019), 0.25 mg (06/02/2019), 0.25 mg (06/22/2019) ?CARBOplatin (PARAPLATIN) 410 mg in sodium chloride 0.9 % 250 mL chemo infusion, 410 mg (100 % of original dose 414 mg), Intravenous,  Once, 4 of 4 cycles ?Dose modification:   (original dose 414 mg, Cycle 1) ?Administration: 410 mg (02/09/2019), 410 mg (03/03/2019), 410 mg (06/02/2019), 410 mg (06/22/2019) ?etoposide (VEPESID) 180 mg in sodium chloride 0.9 % 500 mL chemo infusion, 100 mg/m2 = 180 mg, Intravenous,  Once, 4 of 4 cycles ?Administration: 180 mg (02/09/2019), 180 mg (02/10/2019), 180 mg (02/11/2019), 180 mg (03/03/2019), 180 mg (03/04/2019), 180 mg (03/05/2019), 180 mg (06/02/2019), 180 mg (06/03/2019), 180 mg (06/04/2019), 180 mg (06/22/2019), 180 mg (06/23/2019), 180 mg (06/24/2019) ?fosaprepitant (EMEND) 150 mg, dexamethasone (DECADRON) 6 mg in sodium chloride 0.9 % 145 mL IVPB, , Intravenous,  Once, 2 of 2 cycles ?Administration:  (06/02/2019),  (06/22/2019) ? ? for chemotherapy treatment.  ? ?  ?Cancer of ascending colon ([place])  ?05/04/2019 Initial Diagnosis  ? Cancer of ascending colon ([employer]) ? ?  ?11/30/2019 -  Chemotherapy  ?  Patient is on Treatment Plan: COLORECTAL FOLFOX + BEVACIZUMAB Q14D ? ?  ? ?  ? ?HISTORY OF PRESENTING ILLNESS: Accompanied by his wife.  Ambulating independently. ? ?Kevin Mckay 71 y.o.  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 ? ?Patient complains of overall feeling poorly.  Complains of diarrhea over the last many weeks.  1-2 loose stools a day. ? ?He also complains of random episodes of lightheadedness/presyncopal episodes that he started to  the ground.  2-3 a day; however more recently up to 5 a day.  No trauma.  No seizure-like  activity.  No complete loss of consciousness.  Not positional.  Also complains of headaches.  Complains of weight loss. ? ? ?Review of Systems  ?Constitutional:  Positive for malaise/fatigue and weight loss. Negative for chills, diaphoresis and fever.  ?HENT:  Negative for nosebleeds and sore throat.   ?Eyes:  Negative for double vision.  ?Respiratory:  Negative for hemoptysis and wheezing.   ?Cardiovascular:  Negative for chest pain, palpitations and orthopnea.  ?Gastrointestinal:  Negative for abdominal pain, blood in stool, constipation, diarrhea, heartburn, melena, nausea and vomiting.  ?Genitourinary:  Negative for dysuria, frequency and urgency.  ?Musculoskeletal:  Positive for back pain and joint pain.  ?Skin: Negative.  Negative for itching and rash.  ?Neurological:  Positive for tingling. Negative for dizziness, focal weakness and weakness.  ?Endo/Heme/Allergies:  Does not bruise/bleed easily.  ?Psychiatric/Behavioral:  Negative for depression. The patient is not nervous/anxious and does not have insomnia.    ? ?MEDICAL HISTORY:  ?Past Medical History:  ?Diagnosis Date  ? Asthma   ? Cancer [facility])   ? Metastatic small cell lung cancer  ? Cerebral aneurysm   ? Chronic painful diabetic neuropathy ([place])   ? Depression   ? Diabetes mellitus without complication ([place])   ? Essential hypertension   ? History of kidney stones   ? Occasional tremors   ? Tobacco use   ? ? ?SURGICAL HISTORY: ?Past Surgical History:  ?Procedure Laterality Date  ? ABDOMINAL SURGERY    ? age 89. trauma surgery due to forklift injury- liver and spleen  ? COLONOSCOPY WITH PROPOFOL N/A 03/27/2019  ? Procedure: COLONOSCOPY WITH PROPOFOL;  Surgeon: Jonathon Bellows, MD;  Location: [facility] ENDOSCOPY;  Service: Gastroenterology;  Laterality: N/A;  ? COLOSTOMY REVISION Right 05/04/2019  ? Procedure: COLON RESECTION RIGHT-right hemicolectomy-open;  Surgeon: Fredirick Maudlin, MD;  Location: [facility] ORS;  Service: General;  Laterality: Right;  ?  ESOPHAGOGASTRODUODENOSCOPY (EGD) WITH PROPOFOL N/A 03/27/2019  ? Procedure: ESOPHAGOGASTRODUODENOSCOPY (EGD) WITH PROPOFOL;  Surgeon: Jonathon Bellows, MD;  Location[facility] ENDOSCOPY;  Service: Gastroenterology;  Laterality: N/A;  ? IR GENERIC HISTORICAL  05/31/2016  ? IR ANGIO VERTEBRAL SEL VERTEBRAL BILAT MOD SED 05/31/2016 Consuella Lose, MD [facility]-INTER[facility] RAD  ? IR GENERIC HISTORICAL  05/31/2016  ? IR ANGIO INTRA EXTRACRAN SEL INTERNAL CAROTID BILAT MOD SED 05/31/2016 Consuella Lose, MD [facility]INTER[facility]  ? PORTACATH PLACEMENT Right 02/04/2019  ? Procedure: INSERTION PORT-A-CATH;  Surgeon: [name], MD;  Location: [facility] ORS;  Service: General;  Laterality: Right;  ? THORACOTOMY Left 01/22/2019  ? Procedure: PRE OP BRONCH LEFT ANTERIOR THORACOTOMY WITH BIOPSY OF MEDIASTINAL MASS;  Surgeon: [name], MD;  Location: [facility] ORS;  Service: General;  Laterality: Left;  ? ? ?SOCIAL HISTORY: ?Social History  ? ?Socioeconomic History  ? Marital status: Married  ?  Spouse name: Not on file  ? Number of children: Not on file  ? Years of education: Not on file  ? Highest education level: Not on file  ?Occupational History  ? Not on file  ?Tobacco Use  ? Smoking status: Every Day  ?  Packs/day: 0.50  ?  Years: 50.00  ?  Pack years: 25.00  ?  Types: Cigarettes  ? Smokeless tobacco: Never  ?Vaping Use  ? Vaping Use: Never used  ?Substance and Sexual Activity  ? Alcohol use: No  ? Drug use: Yes  ?  Types: Barbituates,  Marijuana  ? Sexual activity: Not Currently  ?Other Topics Concern  ? Not on file  ?Social History Narrative  ? Lives in [place]; wife/ son/grandson [custody]; smoker; vending business; hx of alcoholism- quit at 89.   ? ?Social Determinants of Health  ? ?Financial Resource Strain: Not on file  ?Food Insecurity: Not on file  ?Transportation Needs: Not on file  ?Physical Activity: Not on file  ?Stress: Not on file  ?Social Connections: Not on file  ?Intimate Partner Violence: Not on file  ? ? ?FAMILY HISTORY: ?Family  History  ?Problem Relation Age of Onset  ? Lymphoma Father   ? Liver cancer Paternal Uncle   ? Lung cancer Paternal Uncle   ? Lung cancer Maternal Uncle   ? ? ?ALLERGIES:  is allergic to ferumoxytol, amoxicillin, and codeine. ?

## 2021-11-07 NOTE — Assessment & Plan Note (Addendum)
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 April 12th 2023-no evidence of progression of subcapsular liver lesion/biopsy-proven colon cancer; no evidence of new disease.  ? ?# LEFT hilar/LUL-  Limited stage small cell cancer-s/p chemo-RT; April 12th, 2023-CT chest- Unchanged perihilar and suprahilar post treatment/post radiation fibrosis and consolidation of the left upper lobe. STABLE.  ? ?# Syncopal episodes: random lightheaded; slouch to ground; at least a mins [4-5 times day]; headaches; feesl weak- ?  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  Recommend stat MRI of the brain.  If negative consider bilateral carotid Dopplers; evaluation with vascular. ? ?# Diarrhea- 1-2- times day.  Recommend symptomatic treatment.  ? ?# Anemia- secondary- sec to chemo-hemoglobin on 10-11-STABLE.  ? ?#Hyponatremia sodium 132- STABLE. ? ?# Back pain- MRI lumbar spine- July 2021-NEG for mets; severe arthritic changes; spinal canal stenosis-stable hydrocodone 1 day prn - STABLE.  ? ?# Poorly controlled diabetes blood sugars-better controlled as per patients-overall stable FBG-288- ?  ?# HTN- 120s/ 70-OFF[[ Avastin]- continue current anti-HTNs. - stable ? ?#Incidental findings on Imaging CT APRIL, 2023: Emphysema coronary arteriosclerosis I reviewed/discussed/counseled the patient.  ? ?# DISPOSITION:  ?# MRI brain STAT.  ?# follow up TBD-Dr.B ? ?Cc; [place]-  ? ? ?

## 2021-11-08 ENCOUNTER — Ambulatory Visit
Admission: RE | Admit: 2021-11-08 | Discharge: 2021-11-08 | Disposition: A | Discharge status: Home / Self Care | Payer: BC Managed Care – PPO | Source: Ambulatory Visit | Attending: Internal Medicine | Admitting: Internal Medicine

## 2021-11-08 ENCOUNTER — Telehealth: Payer: Self-pay | Admitting: Internal Medicine

## 2021-11-08 DIAGNOSIS — R55 Syncope and collapse: Secondary | ICD-10-CM | POA: Diagnosis present

## 2021-11-08 DIAGNOSIS — C182 Malignant neoplasm of ascending colon: Secondary | ICD-10-CM | POA: Insufficient documentation

## 2021-11-08 LAB — CEA: CEA: 8.3 ng/mL — ABNORMAL HIGH (ref 0.0–4.7)

## 2021-11-08 MED ORDER — GADOBUTROL 1 MMOL/ML IV SOLN
5.0000 mL | Freq: Once | INTRAVENOUS | Status: AC | PRN
  Administered 2021-11-08: 5 mL via INTRAVENOUS

## 2021-11-08 NOTE — Telephone Encounter (Signed)
Called patient left voicemail that MRI did not show any concerns for any metastatic disease in the brain. ? ?Recommend evaluation of presyncope with carotids/ 2 d echo ASAP.  Ordered. ? ?Follow-up in 6 weeks- MD; labs-CBC CMP;CEA; port flush-Dr.B ?

## 2021-11-09 ENCOUNTER — Encounter: Payer: Self-pay | Admitting: Internal Medicine

## 2021-11-09 ENCOUNTER — Telehealth: Payer: Self-pay | Admitting: *Deleted

## 2021-11-09 NOTE — Telephone Encounter (Signed)
Please schedule as MD recommends and inform patient of appt details.

## 2021-11-09 NOTE — Telephone Encounter (Signed)
Hilda Blades called asking for results of MRI which I read to her and she is also asking about when he is to return to office as he has no follow up at this time. Please advise

## 2021-11-09 NOTE — Addendum Note (Signed)
Addended by: Vanice Sarah on: 11/09/2021 08:39 AM   Modules accepted: Orders

## 2021-11-10 ENCOUNTER — Telehealth: Payer: Self-pay | Admitting: Internal Medicine

## 2021-11-10 ENCOUNTER — Encounter: Payer: Self-pay | Admitting: Internal Medicine

## 2021-11-10 NOTE — Telephone Encounter (Signed)
Left VM with patient (as well as spouse) to notify him of echo scheduled for Monday 5/22. Left instructions (location/arrival time) with request that patient please call back to confirm.

## 2021-11-13 ENCOUNTER — Ambulatory Visit
Admission: RE | Admit: 2021-11-13 | Discharge: 2021-11-13 | Disposition: A | Discharge status: Home / Self Care | Payer: BC Managed Care – PPO | Source: Ambulatory Visit | Attending: Internal Medicine | Admitting: Internal Medicine

## 2021-11-13 DIAGNOSIS — R55 Syncope and collapse: Secondary | ICD-10-CM | POA: Diagnosis present

## 2021-11-13 LAB — ECHOCARDIOGRAM COMPLETE
AR max vel: 2.99 cm2
AV Area VTI: 2.73 cm2
AV Area mean vel: 2.77 cm2
AV Mean grad: 3 mmHg
AV Peak grad: 4.7 mmHg
Ao pk vel: 1.08 m/s
Area-P 1/2: 4.19 cm2
MV VTI: 1.76 cm2
S' Lateral: 2.18 cm

## 2021-11-23 ENCOUNTER — Telehealth: Payer: Self-pay

## 2021-11-23 NOTE — Telephone Encounter (Signed)
Echo performed on 5/22 has not resulted yet.  Message left with cardiopulmonary dept for update on results.

## 2021-11-23 NOTE — Telephone Encounter (Signed)
-----   Message from Secundino Ginger sent at 11/23/2021  8:43 AM EDT ----- Regarding: Echo Results Contact: [phone] Echo Results ... Patient called and got me. He said he never received results for his echo.

## 2021-11-23 NOTE — Telephone Encounter (Signed)
Patient was to be scheduled for carotid US as well (see 5/17 phone note from MD) but was not scheduled.  Please get scheduled and I will call pt to inform him of appt details.

## 2021-11-23 NOTE — Telephone Encounter (Signed)
Notified patient that Echo results not available eat this time and of the carotid US appt details.

## 2021-11-24 ENCOUNTER — Encounter: Payer: Self-pay | Admitting: Internal Medicine

## 2021-11-24 NOTE — Telephone Encounter (Addendum)
Echo report from 11/13/21 scanned in chart.  Pt is scheduled for US carotids on 11/30/21

## 2021-11-24 NOTE — Progress Notes (Signed)
Unable to reach the patient. Left a voicemail set to the echo results on the [place]. Await carotid Doppler Recommend follow up with vascular surgeon. Discussed  with doctor.Dew.

## 2021-11-24 NOTE — Telephone Encounter (Signed)
Spoke with cardiopulmomary dept and the echo has been interpreted but results did not cross over in [place].  They are sending a fax of the results.

## 2021-11-30 ENCOUNTER — Ambulatory Visit: Payer: BC Managed Care – PPO

## 2021-11-30 ENCOUNTER — Emergency Department: Payer: BC Managed Care – PPO

## 2021-11-30 ENCOUNTER — Observation Stay
Admission: EM | Admit: 2021-11-30 | Discharge: 2021-12-02 | Disposition: A | Discharge status: Home / Self Care | Payer: BC Managed Care – PPO | Attending: Internal Medicine | Admitting: Internal Medicine

## 2021-11-30 ENCOUNTER — Telehealth: Payer: Self-pay | Admitting: *Deleted

## 2021-11-30 ENCOUNTER — Other Ambulatory Visit: Payer: Self-pay

## 2021-11-30 DIAGNOSIS — K635 Polyp of colon: Secondary | ICD-10-CM | POA: Diagnosis not present

## 2021-11-30 DIAGNOSIS — Z79899 Other long term (current) drug therapy: Secondary | ICD-10-CM | POA: Diagnosis not present

## 2021-11-30 DIAGNOSIS — Z85118 Personal history of other malignant neoplasm of bronchus and lung: Secondary | ICD-10-CM | POA: Diagnosis not present

## 2021-11-30 DIAGNOSIS — K625 Hemorrhage of anus and rectum: Secondary | ICD-10-CM | POA: Diagnosis present

## 2021-11-30 DIAGNOSIS — Z85038 Personal history of other malignant neoplasm of large intestine: Secondary | ICD-10-CM | POA: Insufficient documentation

## 2021-11-30 DIAGNOSIS — Z7902 Long term (current) use of antithrombotics/antiplatelets: Secondary | ICD-10-CM | POA: Diagnosis not present

## 2021-11-30 DIAGNOSIS — K621 Rectal polyp: Secondary | ICD-10-CM | POA: Insufficient documentation

## 2021-11-30 DIAGNOSIS — F17210 Nicotine dependence, cigarettes, uncomplicated: Secondary | ICD-10-CM | POA: Diagnosis not present

## 2021-11-30 DIAGNOSIS — K640 First degree hemorrhoids: Secondary | ICD-10-CM | POA: Diagnosis not present

## 2021-11-30 DIAGNOSIS — K5730 Diverticulosis of large intestine without perforation or abscess without bleeding: Secondary | ICD-10-CM | POA: Insufficient documentation

## 2021-11-30 DIAGNOSIS — Z720 Tobacco use: Secondary | ICD-10-CM

## 2021-11-30 DIAGNOSIS — R1084 Generalized abdominal pain: Secondary | ICD-10-CM

## 2021-11-30 DIAGNOSIS — Z794 Long term (current) use of insulin: Secondary | ICD-10-CM | POA: Insufficient documentation

## 2021-11-30 DIAGNOSIS — K922 Gastrointestinal hemorrhage, unspecified: Secondary | ICD-10-CM | POA: Diagnosis present

## 2021-11-30 DIAGNOSIS — I10 Essential (primary) hypertension: Secondary | ICD-10-CM | POA: Diagnosis not present

## 2021-11-30 DIAGNOSIS — K3189 Other diseases of stomach and duodenum: Secondary | ICD-10-CM | POA: Insufficient documentation

## 2021-11-30 DIAGNOSIS — E1140 Type 2 diabetes mellitus with diabetic neuropathy, unspecified: Secondary | ICD-10-CM | POA: Diagnosis present

## 2021-11-30 DIAGNOSIS — Z9049 Acquired absence of other specified parts of digestive tract: Secondary | ICD-10-CM | POA: Diagnosis not present

## 2021-11-30 DIAGNOSIS — R109 Unspecified abdominal pain: Secondary | ICD-10-CM | POA: Diagnosis present

## 2021-11-30 DIAGNOSIS — J45909 Unspecified asthma, uncomplicated: Secondary | ICD-10-CM | POA: Diagnosis not present

## 2021-11-30 DIAGNOSIS — C182 Malignant neoplasm of ascending colon: Secondary | ICD-10-CM

## 2021-11-30 DIAGNOSIS — I739 Peripheral vascular disease, unspecified: Secondary | ICD-10-CM | POA: Diagnosis not present

## 2021-11-30 DIAGNOSIS — Z7984 Long term (current) use of oral hypoglycemic drugs: Secondary | ICD-10-CM | POA: Insufficient documentation

## 2021-11-30 DIAGNOSIS — Z980 Intestinal bypass and anastomosis status: Secondary | ICD-10-CM | POA: Insufficient documentation

## 2021-11-30 DIAGNOSIS — Z20822 Contact with and (suspected) exposure to covid-19: Secondary | ICD-10-CM | POA: Insufficient documentation

## 2021-11-30 DIAGNOSIS — K222 Esophageal obstruction: Secondary | ICD-10-CM | POA: Diagnosis not present

## 2021-11-30 LAB — PROTIME-INR
INR: 1 (ref 0.8–1.2)
Prothrombin Time: 13.5 seconds (ref 11.4–15.2)

## 2021-11-30 LAB — CK: Total CK: 54 U/L (ref 49–397)

## 2021-11-30 LAB — LACTIC ACID, PLASMA
Lactic Acid, Venous: 1.4 mmol/L (ref 0.5–1.9)
Lactic Acid, Venous: 1.1 mmol/L (ref 0.5–1.9)

## 2021-11-30 LAB — COMPREHENSIVE METABOLIC PANEL
ALT: 16 U/L (ref 0–44)
AST: 18 U/L (ref 15–41)
Albumin: 4.1 g/dL (ref 3.5–5.0)
Alkaline Phosphatase: 57 U/L (ref 38–126)
Anion gap: 8 (ref 5–15)
BUN: 18 mg/dL (ref 8–23)
CO2: 22 mmol/L (ref 22–32)
Calcium: 9.9 mg/dL (ref 8.9–10.3)
Chloride: 103 mmol/L (ref 98–111)
Creatinine, Ser: 1.11 mg/dL (ref 0.61–1.24)
GFR, Estimated: >60 mL/min (ref >60)
Glucose, Bld: 249 mg/dL — ABNORMAL HIGH (ref 70–99)
Potassium: 4.8 mmol/L (ref 3.5–5.1)
Sodium: 133 mmol/L — ABNORMAL LOW (ref 135–145)
Total Bilirubin: 0.6 mg/dL (ref 0.3–1.2)
Total Protein: 7.5 g/dL (ref 6.5–8.1)

## 2021-11-30 LAB — TYPE AND SCREEN
ABO/RH(D): A POS
Antibody Screen: NEGATIVE

## 2021-11-30 LAB — LIPASE, BLOOD: Lipase: 31 U/L (ref 11–51)

## 2021-11-30 LAB — CBC
HCT: 38.7 % — ABNORMAL LOW (ref 39.0–52.0)
Hemoglobin: 12.9 g/dL — ABNORMAL LOW (ref 13.0–17.0)
MCH: 27 pg (ref 26.0–34.0)
MCHC: 33.3 g/dL (ref 30.0–36.0)
MCV: 81.1 fL (ref 80.0–100.0)
Platelets: 202 10*3/uL (ref 150–400)
RBC: 4.77 MIL/uL (ref 4.22–5.81)
RDW: 15.3 % (ref 11.5–15.5)
WBC: 6.9 10*3/uL (ref 4.0–10.5)
nRBC: 0 % (ref 0.0–0.2)

## 2021-11-30 LAB — GLUCOSE, CAPILLARY: Glucose-Capillary: 126 mg/dL — ABNORMAL HIGH (ref 70–99)

## 2021-11-30 LAB — SARS CORONAVIRUS 2 BY RT PCR: SARS Coronavirus 2 by RT PCR: NEGATIVE

## 2021-11-30 LAB — GAMMA GT: GGT: 13 U/L (ref 7–50)

## 2021-11-30 MED ORDER — MORPHINE SULFATE (PF) 4 MG/ML IV SOLN
4.0000 mg | Freq: Once | INTRAVENOUS | Status: AC
  Administered 2021-11-30: 4 mg via INTRAVENOUS
  Filled 2021-11-30: qty 1

## 2021-11-30 MED ORDER — ONDANSETRON HCL 4 MG/2ML IJ SOLN
4.0000 mg | Freq: Once | INTRAMUSCULAR | Status: AC
Start: 2021-11-30 — End: 2021-11-30
  Administered 2021-11-30: 4 mg via INTRAVENOUS
  Filled 2021-11-30: qty 2

## 2021-11-30 MED ORDER — SODIUM CHLORIDE 0.9 % IV BOLUS
1000.0000 mL | Freq: Once | INTRAVENOUS | Status: AC
  Administered 2021-11-30: 1000 mL via INTRAVENOUS

## 2021-11-30 MED ORDER — SODIUM CHLORIDE 0.9% FLUSH
3.0000 mL | Freq: Two times a day (BID) | INTRAVENOUS | Status: DC
  Administered 2021-12-01: 3 mL via INTRAVENOUS

## 2021-11-30 MED ORDER — DICYCLOMINE HCL 10 MG/ML IM SOLN
20.0000 mg | Freq: Once | INTRAMUSCULAR | Status: AC
  Administered 2021-11-30: 20 mg via INTRAMUSCULAR
  Filled 2021-11-30: qty 2

## 2021-11-30 MED ORDER — ACETAMINOPHEN 325 MG PO TABS
650.0000 mg | ORAL_TABLET | Freq: Four times a day (QID) | ORAL | Status: DC | PRN
Start: 2021-11-30 — End: 2021-12-02

## 2021-11-30 MED ORDER — NICOTINE 21 MG/24HR TD PT24
21.0000 mg | MEDICATED_PATCH | Freq: Every day | TRANSDERMAL | Status: DC
  Administered 2021-11-30 – 2021-12-02 (×3): 21 mg via TRANSDERMAL
  Filled 2021-11-30 (×3): qty 1

## 2021-11-30 MED ORDER — PANTOPRAZOLE SODIUM 40 MG IV SOLR
40.0000 mg | Freq: Two times a day (BID) | INTRAVENOUS | Status: DC
  Administered 2021-11-30 – 2021-12-02 (×4): 40 mg via INTRAVENOUS
  Filled 2021-11-30 (×4): qty 10

## 2021-11-30 MED ORDER — IOHEXOL 300 MG/ML  SOLN
100.0000 mL | Freq: Once | INTRAMUSCULAR | Status: AC | PRN
  Administered 2021-11-30: 100 mL via INTRAVENOUS

## 2021-11-30 MED ORDER — HYDRALAZINE HCL 20 MG/ML IJ SOLN: 10.0000 mg | Freq: Four times a day (QID) | INTRAMUSCULAR | Status: DC | PRN

## 2021-11-30 MED ORDER — SODIUM CHLORIDE 0.9 % IV SOLN: INTRAVENOUS | Status: AC

## 2021-11-30 MED ORDER — MORPHINE SULFATE (PF) 4 MG/ML IV SOLN
6.0000 mg | Freq: Once | INTRAVENOUS | Status: AC
  Administered 2021-11-30: 6 mg via INTRAVENOUS
  Filled 2021-11-30: qty 2

## 2021-11-30 MED ORDER — MORPHINE SULFATE (PF) 2 MG/ML IV SOLN
2.0000 mg | INTRAVENOUS | Status: DC | PRN
  Administered 2021-12-01 – 2021-12-02 (×4): 2 mg via INTRAVENOUS
  Filled 2021-11-30 (×4): qty 1

## 2021-11-30 MED ORDER — ACETAMINOPHEN 650 MG RE SUPP
650.0000 mg | Freq: Four times a day (QID) | RECTAL | Status: DC | PRN
Start: 2021-11-30 — End: 2021-12-02

## 2021-11-30 MED ORDER — SODIUM CHLORIDE 0.9 % IV SOLN: Freq: Once | INTRAVENOUS | Status: AC

## 2021-11-30 NOTE — Assessment & Plan Note (Addendum)
GI planning EGD and colonoscopy tomorrow with Dr. Haig Prophet.  No further obvious GI bleed while in the hospital.  H&H stable

## 2021-11-30 NOTE — Assessment & Plan Note (Addendum)
counseled about danger of ongoing smoking.

## 2021-11-30 NOTE — H&P (Addendum)
History and Physical    Patient: Kevin Mckay ZOX:[id number] DOB: 1951/03/12 DOA: 11/30/2021 DOS: the patient was seen and examined on 12/01/2021 PCP: [name], PA  Patient coming from: Home  Chief Complaint:  Chief Complaint  Patient presents with   GI Problem   HPI: Kevin Mckay is a 71 y.o. male with medical history significant of hypertension, diabetes mellitus type 2, tobacco abuse, peripheral arterial disease, colon cancer, history of lung cancer coming with abd pain for several weeks.  Abdominal pain:  Duration: About a month.  Frequency: Intermittent.   Location: Generalized abd pain.  Quality: Sharp   Rate:9/10   Radiation: Nonradiating.  Aggravating: None  . No relation to meals. NO NSAID;s or goody or BC recently last time he used it was 10  years ago.   Alleviating: None  Associated factors: Diarrhea intermittently and rectal bleeding majority of the time.   Pt also describes claudication symptoms and pulses today are present and found using dopplers only. Palpable pedal pulses are absent.  Pt states he has trouble and pain and cramping with walking.    Review of Systems  Gastrointestinal:  Positive for abdominal pain, blood in stool and nausea.  All other systems reviewed and are negative.   Review of Systems:  Past Medical History:  Diagnosis Date   Asthma    Cancer ([place])    Metastatic small cell lung cancer   Cerebral aneurysm    Chronic painful diabetic neuropathy (HCC)    Depression    Diabetes mellitus without complication ([place])    Essential hypertension    History of kidney stones    Occasional tremors    Tobacco use    Past Surgical History:  Procedure Laterality Date   ABDOMINAL SURGERY     age 67. trauma surgery due to forklift injury- liver and spleen   COLONOSCOPY WITH PROPOFOL N/A 03/27/2019   Procedure: COLONOSCOPY WITH PROPOFOL;  Surgeon: Jonathon Bellows, MD;  Location: [facility] ENDOSCOPY;  Service: Gastroenterology;   Laterality: N/A;   COLOSTOMY REVISION Right 05/04/2019   Procedure: COLON RESECTION RIGHT-right hemicolectomy-open;  Surgeon: Fredirick Maudlin, MD;  Location: [facility] ORS;  Service: General;  Laterality: Right;   ESOPHAGOGASTRODUODENOSCOPY (EGD) WITH PROPOFOL N/A 03/27/2019   Procedure: ESOPHAGOGASTRODUODENOSCOPY (EGD) WITH PROPOFOL;  Surgeon: Jonathon Bellows, MD;  Location: [facility] ENDOSCOPY;  Service: Gastroenterology;  Laterality: N/A;   IR GENERIC HISTORICAL  05/31/2016   IR ANGIO VERTEBRAL SEL VERTEBRAL BILAT MOD SED 05/31/2016 Consuella Lose, MD [facility]-INTER[facility] RAD   IR GENERIC HISTORICAL  05/31/2016   IR ANGIO INTRA EXTRACRAN SEL INTERNAL CAROTID BILAT MOD SED 05/31/2016 Consuella Lose, MD [facility]   PORTACATH PLACEMENT Right 02/04/2019   Procedure: INSERTION PORT-A-CATH;  Surgeon: [name], MD;  Location: [facility] ORS;  Service: General;  Laterality: Right;   THORACOTOMY Left 01/22/2019   Procedure: PRE OP BRONCH LEFT ANTERIOR THORACOTOMY WITH BIOPSY OF MEDIASTINAL MASS;  Surgeon: [name], MD;  Location: [facility] ORS;  Service: General;  Laterality: Left;   Social History:  reports that he has been smoking cigarettes. He has a 25.00 pack-year smoking history. He has never used smokeless tobacco. He reports current drug use. Drugs: Barbituates and Marijuana. He reports that he does not drink alcohol.  Allergies  Allergen Reactions   Ferumoxytol Shortness Of Breath and Other (See Comments)    Infusion reaction-shortness of breath/ tachycardia/hypotension.   Amoxicillin Hives    Did it involve swelling of the face/tongue/throat, SOB, or low BP? No Did it involve sudden  or severe rash/hives, skin peeling, or any reaction on the inside of your mouth or nose? Yes Did you need to seek medical attention at a hospital or doctor's office? No When did it last happen?~5 years ago If all above answers are "NO", may proceed with cephalosporin use.    Codeine Nausea Only    Family History  Problem  Relation Age of Onset   Lymphoma Father    Liver cancer Paternal Uncle    Lung cancer Paternal Uncle    Lung cancer Maternal Uncle     Prior to Admission medications   Medication Sig Start Date End Date Taking? Authorizing Provider  amLODipine (NORVASC) 10 MG tablet Take 10 mg by mouth every morning.     [provider]  budesonide-formoterol (SYMBICORT) 160-4.5 MCG/ACT inhaler Inhale 2 puffs into the lungs 2 (two) times daily.    [provider]  clopidogrel (PLAVIX) 75 MG tablet Take 1 tablet (75 mg total) by mouth daily. 01/25/21   Kris Hartmann, NP  desonide (DESOWEN) 0.05 % cream Apply 1 application topically 2 (two) times daily as needed. 11/07/20   [provider]  DULoxetine (CYMBALTA) 30 MG capsule Take 30 mg by mouth every morning.     [provider]  gabapentin (NEURONTIN) 800 MG tablet Take 800 mg by mouth 3 (three) times daily. 04/19/20   [provider]  HYDROcodone-acetaminophen (NORCO/VICODIN) 5-325 MG tablet Take 1 tablet by mouth every 12 (twelve) hours as needed for moderate pain. 10/03/21   Cammie Sickle, MD  insulin glargine (LANTUS) 100 UNIT/ML injection Inject into the skin daily. Units based By sliding scale    [provider]  Ipratropium-Albuterol (COMBIVENT) 20-100 MCG/ACT AERS respimat Inhale 1 puff into the lungs every 6 (six) hours as needed. 07/22/19 10/03/21  [provider]  lidocaine-prilocaine (EMLA) cream Apply 1 application. topically as needed. 30-45 mins prior to port access. 10/03/21   Cammie Sickle, MD  lisinopril-hydrochlorothiazide (ZESTORETIC) 10-12.5 MG tablet Take 1 tablet by mouth daily.    [provider]  lovastatin (MEVACOR) 20 MG tablet Take 20 mg by mouth every evening. Patient not taking: Reported on 11/07/2021    [provider]  metFORMIN (GLUCOPHAGE) 1000 MG tablet Take 1,000 mg by mouth 2 (two) times daily with a meal.     [provider]   omeprazole (PRILOSEC) 40 MG capsule Take 1 capsule (40 mg total) by mouth daily. 03/19/19   Jonathon Bellows, MD  ondansetron (ZOFRAN) 8 MG tablet Take 1 tablet (8 mg total) by mouth every 8 (eight) hours as needed for nausea or vomiting (start 3 days; after chemo). Patient not taking: Reported on 11/07/2021 01/29/19   Cammie Sickle, MD  polyethylene glycol powder (MIRALAX) 17 GM/SCOOP powder Mix full container in 64 ounces of Gatorade or other clear liquid. NO RED Liquids Patient not taking: Reported on 11/07/2021 04/09/19   Fredirick Maudlin, MD  prazosin (MINIPRESS) 1 MG capsule Take 1 mg by mouth at bedtime.    [provider]  pyridOXINE (VITAMIN B-6) 100 MG tablet Take 100 mg by mouth daily.    [provider]  sitaGLIPtin (JANUVIA) 25 MG tablet Take 25 mg by mouth daily.    [provider]  sodium chloride 1 g tablet 1 g daily. 02/01/21   [provider]    Physical Exam: Vitals:   11/30/21 2100 11/30/21 2130 11/30/21 2215 11/30/21 2331  BP: (!) 112/56 (!) 151/65 (!) 191/72 (!) 153/71  Pulse: 67 64 63 (!) 59  Resp: 18 20 18 18   Temp: 98.3 F (36.8 C) 98.3 F (36.8 C) 98.1 F (36.7 C) 98 F (36.7 C)  TempSrc: Oral Oral    SpO2: 96% 98% 99% 97%  Weight:   57.4 kg   Height:   5\' 8"  (1.727 m)   Physical Exam Vitals and nursing note reviewed.  Constitutional:      General: He is not in acute distress.    Appearance: He is underweight. He is not ill-appearing, toxic-appearing or diaphoretic.     Interventions: Nasal cannula in place.  HENT:     Head: Normocephalic and atraumatic.     Right Ear: Hearing and external ear normal. No decreased hearing noted.     Left Ear: Hearing and external ear normal. No decreased hearing noted.     Nose: Nose normal. No nasal deformity.     Mouth/Throat:     Lips: Pink.     Mouth: Mucous membranes are dry.     Tongue: No lesions.  Eyes:     General: Lids are normal.     Extraocular Movements: Extraocular  movements intact.     Right eye: Normal extraocular motion and no nystagmus.     Left eye: Normal extraocular motion and no nystagmus.     Pupils: Pupils are equal, round, and reactive to light.  Neck:     Vascular: Normal carotid pulses. No carotid bruit, hepatojugular reflux or JVD.  Cardiovascular:     Rate and Rhythm: Normal rate and regular rhythm.     Pulses: Decreased pulses.          Dorsalis pedis pulses are detected w/ Doppler on the right side and detected w/ Doppler on the left side.       Posterior tibial pulses are detected w/ Doppler on the right side and detected w/ Doppler on the left side.     Heart sounds: Normal heart sounds.  Pulmonary:     Effort: Pulmonary effort is normal.     Breath sounds: Normal breath sounds.  Abdominal:     General: Bowel sounds are normal. There is no distension or abdominal bruit.     Palpations: Abdomen is soft. There is no hepatomegaly, splenomegaly or mass.     Tenderness: There is no abdominal tenderness. There is no guarding.     Hernia: No hernia is present.       Comments: Area of pain per patient.  Musculoskeletal:     Right lower leg: No edema.     Left lower leg: No edema.  Skin:    General: Skin is cool.     Coloration: Skin is ashen.  Neurological:     General: No focal deficit present.     Mental Status: He is alert and oriented to person, place, and time.     Cranial Nerves: Cranial nerves 2-12 are intact.     Motor: Motor function is intact.  Psychiatric:        Attention and Perception: Attention normal.        Mood and Affect: Mood normal.        Speech: Speech normal.        Behavior: Behavior normal. Behavior is cooperative.        Cognition and Memory: Cognition normal.     Data Reviewed: Results for orders placed or performed during the hospital encounter of 11/30/21 (from the past 24 hour(s))  Comprehensive metabolic panel  Status: Abnormal   Collection Time: 11/30/21  1:20 PM  Result Value Ref  Range   Sodium 133 (L) 135 - 145 mmol/L   Potassium 4.8 3.5 - 5.1 mmol/L   Chloride 103 98 - 111 mmol/L   CO2 22 22 - 32 mmol/L   Glucose, Bld 249 (H) 70 - 99 mg/dL   BUN 18 8 - 23 mg/dL   Creatinine, Ser 1.11 0.61 - 1.24 mg/dL   Calcium 9.9 8.9 - 10.3 mg/dL   Total Protein 7.5 6.5 - 8.1 g/dL   Albumin 4.1 3.5 - 5.0 g/dL   AST 18 15 - 41 U/L   ALT 16 0 - 44 U/L   Alkaline Phosphatase 57 38 - 126 U/L   Total Bilirubin 0.6 0.3 - 1.2 mg/dL   GFR, Estimated >60 >60 mL/min   Anion gap 8 5 - 15  CBC     Status: Abnormal   Collection Time: 11/30/21  1:20 PM  Result Value Ref Range   WBC 6.9 4.0 - 10.5 K/uL   RBC 4.77 4.22 - 5.81 MIL/uL   Hemoglobin 12.9 (L) 13.0 - 17.0 g/dL   HCT 38.7 (L) 39.0 - 52.0 %   MCV 81.1 80.0 - 100.0 fL   MCH 27.0 26.0 - 34.0 pg   MCHC 33.3 30.0 - 36.0 g/dL   RDW 15.3 11.5 - 15.5 %   Platelets 202 150 - 400 K/uL   nRBC 0.0 0.0 - 0.2 %  Type and screen [facility]     Status: None   Collection Time: 11/30/21  1:20 PM  Result Value Ref Range   ABO/RH(D) A POS    Antibody Screen NEG    Sample Expiration      12/03/2021,2359 Performed at [facility], [address] [place], [place] [address]   CK     Status: None   Collection Time: 11/30/21  1:20 PM  Result Value Ref Range   Total CK 54 49 - 397 U/L  Lipase, blood     Status: None   Collection Time: 11/30/21  1:20 PM  Result Value Ref Range   Lipase 31 11 - 51 U/L  Protime-INR     Status: None   Collection Time: 11/30/21  4:40 PM  Result Value Ref Range   Prothrombin Time 13.5 11.4 - 15.2 seconds   INR 1.0 0.8 - 1.2  Lactic acid, plasma     Status: None   Collection Time: 11/30/21  4:40 PM  Result Value Ref Range   Lactic Acid, Venous 1.4 0.5 - 1.9 mmol/L     Assessment and Plan: * Abdominal pain Pt is at [facility] for abdominal / mesenteric ischemia. I suspect he has been having symptoms of abdominal pain due to mesenteric insufficiency  Due atherosclerosis  and also BP meds, In his case we will make SBP goal of 150-160 to avoid intermittent hypoperfusion.  Risk factors for him are his ongoing tobacco abuse and his absent pedal pulses which make this very suspicious. Pt does not have an acute abdomen and lactic is currently negative but I suspect he is having mesenteric insufficieny / intermittent ischemia due to intermittent low flow. Have d/w Gi about angio study for CTA/ MRA of abdomen for eval of circulation although CT notes that vessels are patent he is at high risk for embolic ischemia. Currently as pt is stable we will repeat lactic and have high index of suspicion and monitoring for ischemia. Greatly appecaite  GI consult management and care. Hydrate pt and keep SBP 130-140's.  Hold all po meds.  Avoid NSAID'S . Iv ppi.  PRN pain meds.  Vascular consult per AM Team.  CTA/MRA abdomen when 24 hours are past as pt has already had ct with contrast today.       Rectal bleeding We will follow cbc , transfuse as needed.  Appreciate GI consult. Iv ppi. Npo for Upper /lower gi eval for cancer .  PAD (peripheral artery disease) (HCC) Last ABI in 2022 shows: IMPRESSION: 1. Suggestion of bilateral inflow disease including prominent atherosclerotic plaques throughout the bilateral common femoral arteries, likely flow limiting in the left. 2. Multifocal atherosclerotic plaques throughout the bilateral outflow vessels with suggestion of flow limiting stenosis in the bilateral mid superficial femoral arteries. 3. Monophasic bilateral runoff vessels.  Ruthann Cancer, MD Pt needs angio with runoff and vascular consult  as he only has doppler pulses.  Plavix currently held for possible GI procedure in am.   Tobacco use counseled about danger of ongoing smoking.  nicotine patch.   Type 2 diabetes mellitus with diabetic neuropathy (HCC) Pt's a1c more than 2 year ago was 9.5 Due to him being underweight we will change to Januvia or glipizide or  actos / or insulin. D/c metformin so weight loss is avoided further. Currently SSI. NPO.      Essential hypertension PRN hydralazine.        Advance Care Planning:    Code Status: Full Code   Consults:  GI consult- Dr. Marius Ditch.   Family Communication:  Sylis, Ketchum (Spouse)  [phone] (Home Phone)  Severity of Illness: The appropriate patient status for this patient is INPATIENT. Inpatient status is judged to be reasonable and necessary in order to provide the required intensity of service to ensure the patient's safety. The patient's presenting symptoms, physical exam findings, and initial radiographic and laboratory data in the context of their chronic comorbidities is felt to place them at high risk for further clinical deterioration. Furthermore, it is not anticipated that the patient will be medically stable for discharge from the hospital within 2 midnights of admission.   * I certify that at the point of admission it is my clinical judgment that the patient will require inpatient hospital care spanning beyond 2 midnights from the point of admission due to high intensity of service, high risk for further deterioration and high frequency of surveillance required.*  Author: Para Skeans, MD 12/01/2021 1:11 AM  For on call review [email or web address].

## 2021-11-30 NOTE — ED Provider Notes (Addendum)
[facility] Provider Note    Event Date/Time   First MD Initiated Contact with Patient 11/30/21 1549     (approximate)   History   GI Problem   HPI  Kevin Mckay is a 71 y.o. male  with h/o small cell lung CA, colon adenoCA s/p partial colectomy, here with generalized weakness. Reports 3 months of progressively worsening weakness, dizziness. For the past 3 days, he's had increasingly dark black stool, fatigue, nausea, cold sweats and severe abdominal pain. This is new for him.  Pt also had a fall 4 days ago and hit his head on [place]. Describes abdominal pain as episodic, generalized, cramping, increasingly severe. Still passing gas and stool. No specific alleviating factors. No CP, does have SOB from his emphysema and lung CA.       Physical Exam   Triage Vital Signs: ED Triage Vitals  Enc Vitals Group     BP 11/30/21 1315 118/70     Pulse Rate 11/30/21 1315 85     Resp 11/30/21 1315 17     Temp 11/30/21 1315 97.7 F (36.5 C)     Temp Source 11/30/21 1315 Oral     SpO2 11/30/21 1315 98 %     Weight --      Height 11/30/21 1316 5\' 8"  (1.727 m)     Head Circumference --      Peak Flow --      Pain Score 11/30/21 1316 10     Pain Loc --      Pain Edu? --      Excl. in [place]? --     Most recent vital signs: Vitals:   11/30/21 2215 11/30/21 2331  BP: (!) 191/72 (!) 153/71  Pulse: 63 (!) 59  Resp: 18 18  Temp: 98.1 F (36.7 C) 98 F (36.7 C)  SpO2: 99% 97%     General: Awake, appears uncomfortable, in pain. CV:  Good peripheral perfusion. RRR, no murmurs or rubs. No gallops. Resp:  Normal effort. Lungs CTAB. Abd:  No distention. Diffuse, significant TTP with guarding. No rebound. No peritonitis. Other:  Mildly dry MM.   ED Results / Procedures / Treatments   Labs (all labs ordered are listed, but only abnormal results are displayed) Labs Reviewed  COMPREHENSIVE METABOLIC PANEL - Abnormal; Notable for the following components:       Result Value   Sodium 133 (*)    Glucose, Bld 249 (*)    All other components within normal limits  CBC - Abnormal; Notable for the following components:   Hemoglobin 12.9 (*)    HCT 38.7 (*)    All other components within normal limits  GLUCOSE, CAPILLARY - Abnormal; Notable for the following components:   Glucose-Capillary 126 (*)    All other components within normal limits  SARS CORONAVIRUS 2 BY RT PCR  PROTIME-INR  LACTIC ACID, PLASMA  LACTIC ACID, PLASMA  CK  GAMMA GT  LIPASE, BLOOD  OCCULT BLOOD X 1 CARD TO LAB, STOOL  COMPREHENSIVE METABOLIC PANEL  CBC  HIV ANTIBODY (ROUTINE TESTING W REFLEX)  POC OCCULT BLOOD, ED  TYPE AND SCREEN     EKG Accelerated junctional rhythm, VR 89. QRS 82, QTc 428. No acute St elevations or depressions.   RADIOLOGY CT head: No acute intracranial abnormality CT abdomen/pelvis: No acute normality, possible signs of portal hypertension, no signs of active bleeding, status post partial colectomy   I also independently reviewed and agree with radiologist interpretations.  PROCEDURES:  Critical Care performed: No  .1-3 Lead EKG Interpretation  Performed by: Duffy Bruce, MD Authorized by: Duffy Bruce, MD     Interpretation: normal     ECG rate:  70-80   ECG rate assessment: normal     Rhythm: sinus rhythm     Ectopy: none     Conduction: normal   Comments:     Indication: Weakness     MEDICATIONS ORDERED IN ED: Medications  pantoprazole (PROTONIX) injection 40 mg (40 mg Intravenous Given 11/30/21 1829)  sodium chloride flush (NS) 0.9 % injection 3 mL (3 mLs Intravenous Not Given 11/30/21 2226)  0.9 %  sodium chloride infusion ( Intravenous New Bag/Given 11/30/21 2030)  acetaminophen (TYLENOL) tablet 650 mg (has no administration in time range)    Or  acetaminophen (TYLENOL) suppository 650 mg (has no administration in time range)  nicotine (NICODERM CQ - dosed in mg/24 hours) patch 21 mg (21 mg Transdermal Patch  Applied 11/30/21 2000)  morphine (PF) 2 MG/ML injection 2 mg (has no administration in time range)  hydrALAZINE (APRESOLINE) injection 10 mg (has no administration in time range)  morphine (PF) 4 MG/ML injection 6 mg (6 mg Intravenous Given 11/30/21 1647)  ondansetron (ZOFRAN) injection 4 mg (4 mg Intravenous Given 11/30/21 1647)  iohexol (OMNIPAQUE) 300 MG/ML solution 100 mL (100 mLs Intravenous Contrast Given 11/30/21 1658)  morphine (PF) 4 MG/ML injection 4 mg (4 mg Intravenous Given 11/30/21 1801)  dicyclomine (BENTYL) injection 20 mg (20 mg Intramuscular Given 11/30/21 1822)  sodium chloride 0.9 % bolus 1,000 mL (0 mLs Intravenous Stopped 11/30/21 2104)  0.9 %  sodium chloride infusion (0 mLs Intravenous Stopped 11/30/21 2154)     IMPRESSION / MDM / ASSESSMENT AND PLAN / ED COURSE  I reviewed the triage vital signs and the nursing notes.                               The patient is on the cardiac monitor to evaluate for evidence of arrhythmia and/or significant heart rate changes.   Ddx:  Differential includes the following, with pertinent life- or limb-threatening emergencies considered:  UGIB, PUD, cirrhosis w/ gastric or esophageal varices, recurrent colon CA, obstruction, volvulus, colitis  Patient's presentation is most consistent with acute presentation with potential threat to life or bodily function.  MDM:  71 year old male here with generalized abdominal pain, intermittent blood in his stools.  On exam, patient does appear uncomfortable.  CBC shows fairly stable hemoglobin which is reassuring.  CMP with normal renal function and LFTs.  Lactic acid is normal.  CT scan obtained, reviewed, shows possible portal hypertension but is otherwise fairly unremarkable.  Differential includes possible underlying colon mass, mesenteric angina although lactic acid is normal making acute ischemia unlikely, gastritis/peptic ulcer disease, diverticulosis.  Will start on IV PPI, consulted Dr. Marius Ditch who is  aware, and admit to the hospitalist service.   MEDICATIONS GIVEN IN ED: Medications  pantoprazole (PROTONIX) injection 40 mg (40 mg Intravenous Given 11/30/21 1829)  sodium chloride flush (NS) 0.9 % injection 3 mL (3 mLs Intravenous Not Given 11/30/21 2226)  0.9 %  sodium chloride infusion ( Intravenous New Bag/Given 11/30/21 2030)  acetaminophen (TYLENOL) tablet 650 mg (has no administration in time range)    Or  acetaminophen (TYLENOL) suppository 650 mg (has no administration in time range)  nicotine (NICODERM CQ - dosed in mg/24 hours) patch 21 mg (21 mg Transdermal  Patch Applied 11/30/21 2000)  morphine (PF) 2 MG/ML injection 2 mg (has no administration in time range)  hydrALAZINE (APRESOLINE) injection 10 mg (has no administration in time range)  morphine (PF) 4 MG/ML injection 6 mg (6 mg Intravenous Given 11/30/21 1647)  ondansetron (ZOFRAN) injection 4 mg (4 mg Intravenous Given 11/30/21 1647)  iohexol (OMNIPAQUE) 300 MG/ML solution 100 mL (100 mLs Intravenous Contrast Given 11/30/21 1658)  morphine (PF) 4 MG/ML injection 4 mg (4 mg Intravenous Given 11/30/21 1801)  dicyclomine (BENTYL) injection 20 mg (20 mg Intramuscular Given 11/30/21 1822)  sodium chloride 0.9 % bolus 1,000 mL (0 mLs Intravenous Stopped 11/30/21 2104)  0.9 %  sodium chloride infusion (0 mLs Intravenous Stopped 11/30/21 2154)     Consults:  GI consult Hospitalist   EMR reviewed  Dr. Burlene Arnt Oncology notes, h/o small cell lung CA, cecal CA s/p colectomy Reviewed colo notes 2020 noting likely cecal malignancy     FINAL CLINICAL IMPRESSION(S) / ED DIAGNOSES   Final diagnoses:  Gastrointestinal hemorrhage, unspecified gastrointestinal hemorrhage type  Generalized abdominal pain     Rx / DC Orders   ED Discharge Orders     None        Note:  This document was prepared using Dragon voice recognition software and may include unintentional dictation errors.   Duffy Bruce, MD 11/30/21 Emmit Pomfret    Duffy Bruce, MD 11/30/21 [phone]

## 2021-11-30 NOTE — Hospital Course (Addendum)
71 year old male with 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  Oncology consult pending

## 2021-11-30 NOTE — Assessment & Plan Note (Addendum)
Pt's a1c more than 2 year ago was 9.5-we will recheck Hold off on any insulin regimen as he will be undergoing endoscopy tomorrow and he is not eating much

## 2021-11-30 NOTE — Telephone Encounter (Signed)
I called and advised Mrs Kevin Mckay to take patient to ER per Sharion Dove, NP and she agrees to take him in

## 2021-11-30 NOTE — ED Notes (Signed)
Bilateral doppler pedal pulses found in pts lower extremities. Pulses marked.

## 2021-11-30 NOTE — Telephone Encounter (Signed)
Call from Hilda Blades concerned that patient has not eaten in 3 days. She reports that he is having severe pain in his stomach. She also reports that day before yesterday he passed bright red blood in his stools, he has not had a bowel movement today. He is not vomiting, but is nauseated and feels he just cannot eat. Please advise.

## 2021-11-30 NOTE — Assessment & Plan Note (Addendum)
Outpatient vascular surgery follow-up

## 2021-11-30 NOTE — ED Notes (Signed)
Patient to CT at this time

## 2021-11-30 NOTE — Assessment & Plan Note (Addendum)
Pain control with symptomatic management.  GI planning EGD on colonoscopy tomorrow which may get [place] more answers.  This could be presentation of his cancer and or treatment but cannot rule out mesenteric ischemia as he does have PAD as well

## 2021-11-30 NOTE — ED Triage Notes (Signed)
Patient to ER via POV, reports having two episodes of dark tarry stools this week. Previously treated for colon cancer. Also reports dizziness, and indigestion. Reports poor appetite for the last three days due to nausea and epigastric pain.

## 2021-11-30 NOTE — Assessment & Plan Note (Addendum)
PRN hydralazine

## 2021-12-01 DIAGNOSIS — I739 Peripheral vascular disease, unspecified: Secondary | ICD-10-CM | POA: Diagnosis not present

## 2021-12-01 DIAGNOSIS — I10 Essential (primary) hypertension: Secondary | ICD-10-CM | POA: Diagnosis not present

## 2021-12-01 DIAGNOSIS — K625 Hemorrhage of anus and rectum: Secondary | ICD-10-CM | POA: Diagnosis not present

## 2021-12-01 DIAGNOSIS — C182 Malignant neoplasm of ascending colon: Secondary | ICD-10-CM | POA: Diagnosis not present

## 2021-12-01 DIAGNOSIS — R1084 Generalized abdominal pain: Secondary | ICD-10-CM | POA: Diagnosis not present

## 2021-12-01 LAB — CBC
HCT: 36.5 % — ABNORMAL LOW (ref 39.0–52.0)
Hemoglobin: 11.9 g/dL — ABNORMAL LOW (ref 13.0–17.0)
MCH: 27.1 pg (ref 26.0–34.0)
MCHC: 32.6 g/dL (ref 30.0–36.0)
MCV: 83.1 fL (ref 80.0–100.0)
Platelets: 167 10*3/uL (ref 150–400)
RBC: 4.39 MIL/uL (ref 4.22–5.81)
RDW: 15.7 % — ABNORMAL HIGH (ref 11.5–15.5)
WBC: 5.8 10*3/uL (ref 4.0–10.5)
nRBC: 0 % (ref 0.0–0.2)

## 2021-12-01 LAB — COMPREHENSIVE METABOLIC PANEL
ALT: 14 U/L (ref 0–44)
AST: 21 U/L (ref 15–41)
Albumin: 3.7 g/dL (ref 3.5–5.0)
Alkaline Phosphatase: 49 U/L (ref 38–126)
Anion gap: 6 (ref 5–15)
BUN: 18 mg/dL (ref 8–23)
CO2: 24 mmol/L (ref 22–32)
Calcium: 9.1 mg/dL (ref 8.9–10.3)
Chloride: 108 mmol/L (ref 98–111)
Creatinine, Ser: 0.98 mg/dL (ref 0.61–1.24)
GFR, Estimated: >60 mL/min (ref >60)
Glucose, Bld: 141 mg/dL — ABNORMAL HIGH (ref 70–99)
Potassium: 4.2 mmol/L (ref 3.5–5.1)
Sodium: 138 mmol/L (ref 135–145)
Total Bilirubin: 0.6 mg/dL (ref 0.3–1.2)
Total Protein: 6.8 g/dL (ref 6.5–8.1)

## 2021-12-01 LAB — GLUCOSE, CAPILLARY
Glucose-Capillary: 135 mg/dL — ABNORMAL HIGH (ref 70–99)
Glucose-Capillary: 152 mg/dL — ABNORMAL HIGH (ref 70–99)

## 2021-12-01 LAB — HIV ANTIBODY (ROUTINE TESTING W REFLEX): HIV Screen 4th Generation wRfx: NONREACTIVE

## 2021-12-01 MED ORDER — SODIUM CHLORIDE 0.9 % IV SOLN: INTRAVENOUS | Status: DC

## 2021-12-01 MED ORDER — PEG 3350-KCL-NA BICARB-NACL 420 G PO SOLR
4000.0000 mL | Freq: Once | ORAL | Status: AC
  Administered 2021-12-01: 4000 mL via ORAL
  Filled 2021-12-01: qty 4000

## 2021-12-01 NOTE — Anesthesia Preprocedure Evaluation (Addendum)
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  Past Medical History: No date: Asthma No date: Cancer [facility])     Comment:  Metastatic small cell lung cancer No date: Cerebral aneurysm No date: Chronic painful diabetic neuropathy (HCC) No date: Depression No date: Diabetes mellitus without complication (HCC) No date: Essential hypertension No date: History of kidney stones No date: Occasional tremors No date: Tobacco use  Past Surgical History: No date: ABDOMINAL SURGERY     Comment:  age 71. trauma surgery due to forklift injury- liver and              spleen 03/27/2019: COLONOSCOPY WITH PROPOFOL; N/A     Comment:  Procedure: COLONOSCOPY WITH PROPOFOL;  Surgeon: Jonathon Bellows, MD;   Location: [facility] ENDOSCOPY;  Service:               Gastroenterology;  Laterality: N/A; 05/04/2019: COLOSTOMY REVISION; Right     Comment:  Procedure: COLON RESECTION RIGHT-right               hemicolectomy-open;  Surgeon: Fredirick Maudlin, MD;                Location: [facility] ORS;  Service: General;  Laterality:               Right; 03/27/2019: ESOPHAGOGASTRODUODENOSCOPY (EGD) WITH PROPOFOL; N/A     Comment:  Procedure: ESOPHAGOGASTRODUODENOSCOPY (EGD) WITH               PROPOFOL;  Surgeon: Jonathon Bellows, MD;  Location: [facility]               ENDOSCOPY;  Service: Gastroenterology;  Laterality: N/A; 05/31/2016: IR GENERIC HISTORICAL     Comment:  IR ANGIO VERTEBRAL SEL VERTEBRAL BILAT MOD SED 05/31/2016              Consuella Lose, MD [facility]-INTER[facility] RAD 05/31/2016: IR GENERIC HISTORICAL     Comment:  IR ANGIO INTRA EXTRACRAN SEL INTERNAL CAROTID BILAT MOD               SED 05/31/2016 Consuella Lose, MD [facility] 02/04/2019: PORTACATH PLACEMENT; Right     Comment:  Procedure: INSERTION PORT-A-CATH;  Surgeon: Genevive Bi,  Christia Reading, MD;  Location: [facility] ORS;  Service: General;                Laterality: Right; 01/22/2019: THORACOTOMY; Left     Comment:  Procedure: PRE OP BRONCH LEFT ANTERIOR THORACOTOMY WITH               BIOPSY OF MEDIASTINAL MASS;  Surgeon: [name], MD;               Location: [facility] ORS;  Service: General;  Laterality: Left;  BMI    Body Mass Index: 19.31 kg/m      Reproductive/Obstetrics negative OB ROS                            Anesthesia Physical Anesthesia Plan  ASA: 3  Anesthesia Plan: General   Post-op Pain Management: Minimal or no pain anticipated   Induction: Intravenous  PONV Risk Score and Plan: Propofol infusion and TIVA  Airway Management Planned: Natural Airway  Additional Equipment:   Intra-op Plan:   Post-operative Plan:   Informed Consent: I have reviewed the patients History and Physical, chart, labs and  discussed the procedure including the risks, benefits and alternatives for the proposed anesthesia with the patient or authorized representative who has indicated his/her understanding and acceptance.     Dental Advisory Given  Plan Discussed with: Anesthesiologist, CRNA and Surgeon  Anesthesia Plan Comments:        Anesthesia Quick Evaluation

## 2021-12-01 NOTE — Plan of Care (Signed)
  Problem: Education: Goal: Knowledge of General Education information will improve Description: Including pain rating scale, medication(s)/side effects and non-pharmacologic comfort measures Outcome: Progressing   Problem: Clinical Measurements: Goal: Will remain free from infection Outcome: Progressing   Problem: Clinical Measurements: Goal: Respiratory complications will improve Outcome: Progressing   Problem: Activity: Goal: Risk for activity intolerance will decrease Outcome: Progressing   Problem: Elimination: Goal: Will not experience complications related to urinary retention Outcome: Progressing   Problem: Elimination: Goal: Will not experience complications related to bowel motility Outcome: Progressing   Problem: Pain Managment: Goal: General experience of comfort will improve Outcome: Progressing   Problem: Safety: Goal: Ability to remain free from injury will improve Outcome: Progressing

## 2021-12-01 NOTE — Progress Notes (Signed)
  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  Oncology consult pending   Assessment and Plan: * Abdominal pain Pain control with symptomatic management.  GI planning EGD on colonoscopy tomorrow which may get [place] more answers.  This could be presentation of his cancer and or treatment but cannot rule out mesenteric ischemia as he does have PAD as well   Rectal bleeding GI planning EGD and colonoscopy tomorrow with Dr. Haig Prophet.  No further obvious GI bleed while in the hospital.  H&H stable  PAD (peripheral artery disease) [facility]) Outpatient vascular surgery follow-up  Tobacco use counseled about danger of ongoing smoking.    Type 2 diabetes mellitus with diabetic neuropathy (HCC) Pt's a1c more than 2 year ago was 9.5-we will recheck Hold off on any insulin regimen as he will be undergoing endoscopy tomorrow and he is not eating much      Essential hypertension PRN hydralazine         Subjective: Still having 4 out of 10 abdominal pain.    Physical Exam: Vitals:   12/01/21 0448 12/01/21 0745 12/01/21 0800 12/01/21 1200  BP: 139/62 (!) 152/77  (!) 159/67  Pulse:  62  64  Resp: 20  16 18   Temp: 97.7 F (36.5 C) 97.8 F (36.6 C)  98 F (36.7 C)  TempSrc: Oral Oral  Oral  SpO2: 98% 98%  97%  Weight: 57.6 kg     Height:       71 year old male lying in the bed in no acute distress Lungs clear to auscultation bilaterally Abdomen soft, diffuse tenderness.  No rebound or guarding Heart regular rate and rhythm Neuro alert and oriented, nonfocal Skin no rash or lesion Psych normal mood and affect  Data Reviewed:  Hemoglobin 11.9  Family  Communication: None  Disposition: Status is: Observation The patient remains OBS appropriate and will d/c before 2 midnights.  Planned Discharge Destination: Home    DVT prophylaxis-SCDs Time spent: 35 minutes  Author: Max Sane, MD 12/01/2021 2:29 PM  For on call review [email or web address].

## 2021-12-01 NOTE — Consult Note (Signed)
[name] CONSULT NOTE  Patient Care Team: [name], PA as PCP - General (Physician Assistant) Telford Nab, RN as Registered Nurse Clent Jacks, RN as Oncology Nurse Navigator  CHIEF COMPLAINTS/PURPOSE OF CONSULTATION: Lung cancer/colon cancer  HISTORY OF PRESENTING ILLNESS:  Kevin Mckay 71 y.o.  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  Patient is currently admitted to hospital for worsening abdominal pain nausea; prolonged weight loss.  Patient also noted to have bright red blood in stools.  Also complains of dizziness; and passing out spells.  CT scan of the abdomen pelvis on admission showed: No acute process.  Regards to history of limited stage small cell lung cancer -patient is currently s/p chemotherapy radiation-currently under remission.  Regards to history of colon cancer-metastatic to the liver currently s/p palliative chemotherapy.  Most recent CT scan-does not show any evidence of progression of disease.  Oncology has been consulted for further evaluation and recommendations with regards to ongoing abdominal pain in the context of his lung and colon malignancy.   Review of Systems  Constitutional:  Positive for malaise/fatigue and weight loss. Negative for chills, diaphoresis and fever.  HENT:  Negative for nosebleeds and sore throat.   Eyes:  Negative for double vision.  Respiratory:  Negative for cough, hemoptysis, sputum production, shortness of breath and wheezing.   Cardiovascular:  Negative for chest pain, palpitations, orthopnea and leg swelling.  Gastrointestinal:  Positive for abdominal pain, blood in stool and nausea. Negative for constipation, diarrhea, heartburn, melena and vomiting.  Genitourinary:  Negative for dysuria, frequency and urgency.  Musculoskeletal:  Positive for back pain and joint pain.   Skin: Negative.  Negative for itching and rash.  Neurological:  Positive for dizziness. Negative for tingling, focal weakness, weakness and headaches.  Endo/Heme/Allergies:  Does not bruise/bleed easily.  Psychiatric/Behavioral:  Negative for depression. The patient is not nervous/anxious and does not have insomnia.     MEDICAL HISTORY:  Past Medical History:  Diagnosis Date   Asthma    Cancer ([place])    Metastatic small cell lung cancer   Cerebral aneurysm    Chronic painful diabetic neuropathy (HCC)    Depression    Diabetes mellitus without complication ([place])    Essential hypertension    History of kidney stones    Occasional tremors    Tobacco use     SURGICAL HISTORY: Past Surgical History:  Procedure Laterality Date   ABDOMINAL SURGERY     age 57. trauma surgery due to forklift injury- liver and spleen   COLONOSCOPY WITH PROPOFOL N/A 03/27/2019   Procedure: COLONOSCOPY WITH PROPOFOL;  Surgeon: Jonathon Bellows, MD;  Location: [facility] ENDOSCOPY;  Service: Gastroenterology;  Laterality: N/A;   COLOSTOMY REVISION Right 05/04/2019   Procedure: COLON RESECTION RIGHT-right hemicolectomy-open;  Surgeon: Fredirick Maudlin, MD;  Location: [facility] ORS;  Service: General;  Laterality: Right;   ESOPHAGOGASTRODUODENOSCOPY (EGD) WITH PROPOFOL N/A 03/27/2019   Procedure: ESOPHAGOGASTRODUODENOSCOPY (EGD) WITH PROPOFOL;  Surgeon: Jonathon Bellows, MD;  Location: [facility] ENDOSCOPY;  Service: Gastroenterology;  Laterality: N/A;   IR GENERIC HISTORICAL  05/31/2016   IR ANGIO VERTEBRAL SEL VERTEBRAL BILAT MOD SED 05/31/2016 Consuella Lose, MD [facility]-INTER[facility] RAD   IR GENERIC HISTORICAL  05/31/2016   IR ANGIO INTRA EXTRACRAN SEL INTERNAL CAROTID BILAT MOD SED 05/31/2016 Consuella Lose, MD [facility]   PORTACATH PLACEMENT Right 02/04/2019   Procedure: INSERTION  PORT-A-CATH;  Surgeon: [name], MD;  Location: [facility] ORS;  Service: General;  Laterality: Right;   THORACOTOMY Left 01/22/2019   Procedure: PRE OP BRONCH  LEFT ANTERIOR THORACOTOMY WITH BIOPSY OF MEDIASTINAL MASS;  Surgeon: [name], MD;  Location: [facility] ORS;  Service: General;  Laterality: Left;    SOCIAL HISTORY: Social History   Socioeconomic History   Marital status: Married    Spouse name: Not on file   Number of children: Not on file   Years of education: Not on file   Highest education level: Not on file  Occupational History   Not on file  Tobacco Use   Smoking status: Every Day    Packs/day: 0.50    Years: 50.00    Total pack years: 25.00    Types: Cigarettes   Smokeless tobacco: Never  Vaping Use   Vaping Use: Never used  Substance and Sexual Activity   Alcohol use: No   Drug use: Yes    Types: Barbituates, Marijuana   Sexual activity: Not Currently  Other Topics Concern   Not on file  Social History Narrative   Lives in [place]; wife/ son/grandson [custody]; smoker; vending business; hx of alcoholism- quit at 74.    Social Determinants of Health   Financial Resource Strain: Low Risk  (05/04/2019)   Overall Financial Resource Strain (CARDIA)    Difficulty of Paying Living Expenses: Not hard at all  Food Insecurity: No Food Insecurity (05/04/2019)   Hunger Vital Sign    Worried About Running Out of Food in the Last Year: Never true    Ran Out of Food in the Last Year: Never true  Transportation Needs: No Transportation Needs (05/04/2019)   PRAPARE - Hydrologist (Medical): No    Lack of Transportation (Non-Medical): No  Physical Activity: Inactive (05/04/2019)   Exercise Vital Sign    Days of Exercise per Week: 0 days    Minutes of Exercise per Session: 0 min  Stress: No Stress Concern Present (05/04/2019)   [place]    Feeling of Stress : Not at all  Social Connections: Unknown (05/04/2019)   Social Connection and Isolation Panel [NHANES]    Frequency of Communication with Friends and Family: Patient refused     Frequency of Social Gatherings with Friends and Family: Patient refused    Attends Religious Services: Patient refused    Active Member of Clubs or Organizations: Patient refused    Attends Archivist Meetings: Patient refused    Marital Status: Patient refused  Intimate Partner Violence: Not At Risk (05/04/2019)   Humiliation, Afraid, Rape, and Kick questionnaire    Fear of Current or Ex-Partner: No    Emotionally Abused: No    Physically Abused: No    Sexually Abused: No    FAMILY HISTORY: Family History  Problem Relation Age of Onset   Lymphoma Father    Liver cancer Paternal Uncle    Lung cancer Paternal Uncle    Lung cancer Maternal Uncle     ALLERGIES:  is allergic to ferumoxytol, amoxicillin, and codeine.  MEDICATIONS:  Current Facility-Administered Medications  Medication Dose Route Frequency Provider Last Rate Last Admin   0.9 %  sodium chloride infusion   Intravenous Continuous [name], MD 20 mL/hr at 12/01/21 2006 New Bag at 12/01/21 2006   acetaminophen (TYLENOL) tablet 650 mg  650 mg Oral Q6H PRN Para Skeans, MD  Or   acetaminophen (TYLENOL) suppository 650 mg  650 mg Rectal Q6H PRN Para Skeans, MD       hydrALAZINE (APRESOLINE) injection 10 mg  10 mg Intravenous Q6H PRN Para Skeans, MD       morphine (PF) 2 MG/ML injection 2 mg  2 mg Intravenous Q3H PRN Para Skeans, MD   2 mg at 12/01/21 2307   nicotine (NICODERM CQ - dosed in mg/24 hours) patch 21 mg  21 mg Transdermal Daily Florina Ou V, MD   21 mg at 12/01/21 0830   pantoprazole (PROTONIX) injection 40 mg  40 mg Intravenous Bayard Hugger, MD   40 mg at 12/01/21 2123   sodium chloride flush (NS) 0.9 % injection 3 mL  3 mL Intravenous Q12H Para Skeans, MD   3 mL at 12/01/21 2245   Facility-Administered Medications Ordered in Other Encounters  Medication Dose Route Frequency Provider Last Rate Last Admin   heparin lock flush 100 UNIT/ML injection            heparin lock  flush 100 UNIT/ML injection            sodium chloride flush (NS) 0.9 % injection 10 mL  10 mL Intravenous PRN Charlaine Dalton R, MD   10 mL at 09/13/20 0943   sodium chloride flush (NS) 0.9 % injection 10 mL  10 mL Intravenous PRN Cammie Sickle, MD   10 mL at 07/13/21 0948    PHYSICAL EXAMINATION: ECOG PERFORMANCE STATUS: 1 - Symptomatic but completely ambulatory  Vitals:   12/01/21 2005 12/01/21 2310  BP: (!) 156/68 (!) 151/75  Pulse: 65 (!) 58  Resp: 18 17  Temp: 98.1 F (36.7 C) 97.7 F (36.5 C)  SpO2: 100% 99%   Filed Weights   11/30/21 2215 12/01/21 0448  Weight: 126 lb 8 oz (57.4 kg) 126 lb 15.8 oz (57.6 kg)    Physical Exam Vitals and nursing note reviewed.  HENT:     Head: Normocephalic and atraumatic.     Mouth/Throat:     Pharynx: Oropharynx is clear.  Eyes:     Extraocular Movements: Extraocular movements intact.     Pupils: Pupils are equal, round, and reactive to light.  Cardiovascular:     Rate and Rhythm: Normal rate and regular rhythm.  Pulmonary:     Comments: Decreased breath sounds bilaterally.  Abdominal:     Palpations: Abdomen is soft.  Musculoskeletal:        General: Normal range of motion.     Cervical back: Normal range of motion.  Skin:    General: Skin is warm.  Neurological:     General: No focal deficit present.     Mental Status: He is alert and oriented to person, place, and time.  Psychiatric:        Behavior: Behavior normal.        Judgment: Judgment normal.     LABORATORY DATA:  I have reviewed the data as listed Lab Results  Component Value Date   WBC 5.8 12/01/2021   HGB 11.9 (L) 12/01/2021   HCT 36.5 (L) 12/01/2021   MCV 83.1 12/01/2021   PLT 167 12/01/2021   Recent Labs    11/07/21 1106 11/30/21 1320 12/01/21 0641  NA 134* 133* 138  K 4.6 4.8 4.2  CL 99 103 108  CO2 24 22 24   GLUCOSE 231* 249* 141*  BUN 17 18 18   CREATININE 1.10 1.11 0.98  CALCIUM  9.2 9.9 9.1  GFRNONAA >60 >60 >60  PROT  7.3 7.5 6.8  ALBUMIN 3.8 4.1 3.7  AST 20 18 21   ALT 16 16 14   ALKPHOS 61 57 49  BILITOT 0.6 0.6 0.6    RADIOGRAPHIC STUDIES: I have personally reviewed the radiological images as listed and agreed with the findings in the report. CT ABDOMEN PELVIS W CONTRAST  Result Date: 11/30/2021 CLINICAL DATA:  Abdominal pain, acute, nonlocalized. two episodes of dark tarry stools this week. Previously treated for colon cancer. Also reports dizziness, and indigestion. Reports poor appetite for the last three days due to nausea and epigastric pain. EXAM: CT ABDOMEN AND PELVIS WITH CONTRAST TECHNIQUE: Multidetector CT imaging of the abdomen and pelvis was performed using the standard protocol following bolus administration of intravenous contrast. RADIATION DOSE REDUCTION: This exam was performed according to the departmental dose-optimization program which includes automated exposure control, adjustment of the mA and/or kV according to patient size and/or use of iterative reconstruction technique. CONTRAST:  131mL OMNIPAQUE IOHEXOL 300 MG/ML  SOLN COMPARISON:  CT abdomen pelvis 12/07/2020 FINDINGS: Lower chest: Similar-appearing likely distal esophageal wall lipomatous lesion (2:9). No acute abnormality. Hepatobiliary: Vague peripheral right hepatic lobe hypodensity (2:11) consistent with a biopsy-proven metastasis. No gallstones, gallbladder wall thickening, or pericholecystic fluid. No biliary dilatation. Pancreas: No focal lesion. Normal pancreatic contour. No surrounding inflammatory changes. No main pancreatic ductal dilatation. Spleen: Normal in size without focal abnormality. Adrenals/Urinary Tract: No adrenal nodule bilaterally. Bilateral kidneys enhance symmetrically. A 1.2 cm fluid density lesion within left kidney statistically likely represents a simple renal cyst. Simple renal cysts, in the absence of clinically indicated signs/symptoms, require no independent follow-up. No hydronephrosis. No hydroureter.  The urinary bladder is unremarkable. On delayed imaging, there is no urothelial wall thickening and there are no filling defects in the opacified portions of the bilateral collecting systems or ureters. Stomach/Bowel: Partial colectomy involving the right colon. Stomach is within normal limits. No evidence of bowel wall thickening or dilatation. Scattered colonic diverticulosis. Appendix appears normal. Vascular/Lymphatic: The main portal, splenic, superior mesenteric veins are patent. Perigastric and left upper quadrant venous collaterals. No abdominal aorta or iliac aneurysm. Severe atherosclerotic plaque of the aorta and its branches. No abdominal, pelvic, or inguinal lymphadenopathy. Reproductive: Prostate is unremarkable. Other: No intraperitoneal free fluid. No intraperitoneal free gas. No organized fluid collection. Musculoskeletal: No abdominal wall hernia or abnormality. No suspicious lytic or blastic osseous lesions. No acute displaced fracture. L5-S1 intervertebral disc space vacuum phenomenon and endplate sclerosis as well as posterior disc osteophyte complex formation, osteophyte formation, facet arthropathy. IMPRESSION: 1. Perigastric and left upper quadrant venous collaterals. Limited evaluation for an active gastrointestinal hemorrhage on this portal venous study. 2. Status post partial colectomy with treated right hepatic metastasis. 3. Aortic Atherosclerosis (ICD10-I70.0) - severe. Electronically Signed   By: Iven Finn M.D.   On: 11/30/2021 17:23   CT HEAD WO CONTRAST (5MM)  Result Date: 11/30/2021 CLINICAL DATA:  Head trauma, minor (Age >= 65y) EXAM: CT HEAD WITHOUT CONTRAST TECHNIQUE: Contiguous axial images were obtained from the base of the skull through the vertex without intravenous contrast. RADIATION DOSE REDUCTION: This exam was performed according to the departmental dose-optimization program which includes automated exposure control, adjustment of the mA and/or kV according to  patient size and/or use of iterative reconstruction technique. COMPARISON:  MRI head 02/04/2019 FINDINGS: Brain: No evidence of large-territorial acute infarction. No parenchymal hemorrhage. No mass lesion. No extra-axial collection. No mass effect or midline shift.  No hydrocephalus. Basilar cisterns are patent. Vascular: No hyperdense vessel. Atherosclerotic calcifications are present within the cavernous internal carotid and vertebral arteries. Skull: No acute fracture or focal lesion. Sinuses/Orbits: Paranasal sinuses and mastoid air cells are clear. The orbits are unremarkable. Other: None. IMPRESSION: No acute intracranial abnormality. Electronically Signed   By: Iven Finn M.D.   On: 11/30/2021 17:12   ECHOCARDIOGRAM COMPLETE  Result Date: 11/13/2021    ECHOCARDIOGRAM REPORT   Patient Name:   DON TIU Date of Exam: 11/13/2021 Medical Rec #:  [id number]       Height:       68.0 in Accession #:    [id number]      Weight:       131.8 lb Date of Birth:  1951/01/27      BSA:          1.712 m Patient Age:    76 years        BP:           153/79 mmHg Patient Gender: M               HR:           66 bpm. Exam Location:  [facility] Procedure: 2D Echo, Cardiac Doppler and Color Doppler Indications:     R55 Syncope  History:         Patient has no prior history of Echocardiogram examinations.                  Asthma, Lung cancer, Colon cancer; Risk Factors:Current Smoker,                  Hypertension and Diabetes.  Sonographer:     Rosalia Hammers Referring Phys:  [id number] Cammie Sickle Diagnosing Phys: Kathlyn Sacramento MD  Sonographer Comments: Suboptimal parasternal window and suboptimal subcostal window. Image acquisition challenging due to patient body habitus and Image acquisition challenging due to respiratory motion. IMPRESSIONS  1. Left ventricular ejection fraction, by estimation, is 60 to 65%. The left ventricle has normal function. The left ventricle has no regional wall motion abnormalities. There  is mild left ventricular hypertrophy. Left ventricular diastolic parameters were normal.  2. Right ventricular systolic function is normal. The right ventricular size is normal. Tricuspid regurgitation signal is inadequate for assessing PA pressure.  3. The mitral valve is normal in structure. No evidence of mitral valve regurgitation. No evidence of mitral stenosis.  4. The aortic valve is normal in structure. Aortic valve regurgitation is not visualized. Aortic valve sclerosis/calcification is present, without any evidence of aortic stenosis.  5. The inferior vena cava is normal in size with greater than 50% respiratory variability, suggesting right atrial pressure of 3 mmHg. FINDINGS  Left Ventricle: Left ventricular ejection fraction, by estimation, is 60 to 65%. The left ventricle has normal function. The left ventricle has no regional wall motion abnormalities. The left ventricular internal cavity size was normal in size. There is  mild left ventricular hypertrophy. Left ventricular diastolic parameters were normal. Right Ventricle: The right ventricular size is normal. No increase in right ventricular wall thickness. Right ventricular systolic function is normal. Tricuspid regurgitation signal is inadequate for assessing PA pressure. Left Atrium: Left atrial size was normal in size. Right Atrium: Right atrial size was normal in size. Pericardium: There is no evidence of pericardial effusion. Mitral Valve: The mitral valve is normal in structure. No evidence of mitral valve regurgitation. No evidence of mitral valve stenosis. MV peak gradient,  5.1 mmHg. The mean mitral valve gradient is 2.0 mmHg. Tricuspid Valve: The tricuspid valve is normal in structure. Tricuspid valve regurgitation is not demonstrated. No evidence of tricuspid stenosis. Aortic Valve: The aortic valve is normal in structure. Aortic valve regurgitation is not visualized. Aortic valve sclerosis/calcification is present, without any evidence  of aortic stenosis. Aortic valve mean gradient measures 3.0 mmHg. Aortic valve peak  gradient measures 4.7 mmHg. Aortic valve area, by VTI measures 2.73 cm. Pulmonic Valve: The pulmonic valve was normal in structure. Pulmonic valve regurgitation is not visualized. No evidence of pulmonic stenosis. Aorta: The aortic root is normal in size and structure. Venous: The inferior vena cava is normal in size with greater than 50% respiratory variability, suggesting right atrial pressure of 3 mmHg. IAS/Shunts: No atrial level shunt detected by color flow Doppler.  LEFT VENTRICLE PLAX 2D LVIDd:         3.99 cm   Diastology LVIDs:         2.18 cm   LV e' medial:    8.27 cm/s LV PW:         1.28 cm   LV E/e' medial:  14.1 LV IVS:        1.22 cm   LV e' lateral:   10.20 cm/s LVOT diam:     1.90 cm   LV E/e' lateral: 11.5 LV SV:         67 LV SV Index:   39 LVOT Area:     2.84 cm  RIGHT VENTRICLE RV Basal diam:  3.22 cm RV S prime:     13.50 cm/s LEFT ATRIUM             Index        RIGHT ATRIUM          Index LA diam:        2.90 cm 1.69 cm/m   RA Area:     8.91 cm LA Vol (A2C):   42.5 ml 24.83 ml/m  RA Volume:   14.60 ml 8.53 ml/m LA Vol (A4C):   28.9 ml 16.88 ml/m LA Biplane Vol: 36.2 ml 21.15 ml/m  AORTIC VALVE                    PULMONIC VALVE AV Area (Vmax):    2.99 cm     PV Vmax:       1.02 m/s AV Area (Vmean):   2.77 cm     PV Vmean:      66.600 cm/s AV Area (VTI):     2.73 cm     PV VTI:        0.207 m AV Vmax:           108.00 cm/s  PV Peak grad:  4.2 mmHg AV Vmean:          75.500 cm/s  PV Mean grad:  2.0 mmHg AV VTI:            0.247 m AV Peak Grad:      4.7 mmHg AV Mean Grad:      3.0 mmHg LVOT Vmax:         114.00 cm/s LVOT Vmean:        73.800 cm/s LVOT VTI:          0.238 m LVOT/AV VTI ratio: 0.96  AORTA Ao Root diam: 3.20 cm MITRAL VALVE MV Area (PHT): 4.19 cm     SHUNTS MV Area VTI:   1.76  cm     Systemic VTI:  0.24 m MV Peak grad:  5.1 mmHg     Systemic Diam: 1.90 cm MV Mean grad:  2.0 mmHg MV  Vmax:       1.13 m/s MV Vmean:      67.3 cm/s MV Decel Time: 181 msec MV E velocity: 117.00 cm/s MV A velocity: 115.00 cm/s MV E/A ratio:  1.02 Kathlyn Sacramento MD Electronically signed by Kathlyn Sacramento MD Signature Date/Time: 11/13/2021/1:08:34 PM    Final    MR BRAIN W WO CONTRAST  Result Date: 11/08/2021 CLINICAL DATA:  Non-small-cell lung cancer, dizziness, headaches EXAM: MRI HEAD WITHOUT AND WITH CONTRAST TECHNIQUE: Multiplanar, multiecho pulse sequences of the brain and surrounding structures were obtained without and with intravenous contrast. CONTRAST:  31mL GADAVIST GADOBUTROL 1 MMOL/ML IV SOLN COMPARISON:  Brain MRI 02/04/2019 FINDINGS: Brain: There is no acute intracranial hemorrhage, extra-axial fluid collection, or acute infarct. Parenchymal volume is normal. The ventricles are normal in size. Gray-white differentiation is preserved. There are scattered small foci of FLAIR signal abnormality in the subcortical and periventricular white matter, nonspecific but likely reflecting sequela of minimal chronic white matter microangiopathy, not substantially changed since 2020 There is no suspicious parenchymal signal abnormality. There is no mass lesion. There is no abnormal enhancement. There is no mass effect or midline shift. Vascular: Normal flow voids. Skull and upper cervical spine: Normal marrow signal. Sinuses/Orbits: There is mild mucosal thickening in the paranasal sinuses. The globes and orbits are unremarkable. Other: There are bilateral mastoid effusions, unchanged on the right but new on the left. The imaged nasopharynx is unremarkable. IMPRESSION: 1. No evidence of intracranial metastatic disease or other acute intracranial pathology. 2. Bilateral mastoid effusions, unchanged on the right but new on the left. Electronically Signed   By: Valetta Mole M.D.   On: 11/08/2021 51:46     71 year old male patient with multiple medical problems COPD; active smoker; peripheral vascular disease; small  cell lung cancer; and colon cancer is currently admitted to hospital for abdominal pain/rectal bleeding.   # colon cancer-metastatic to the liver currently s/p palliative chemotherapy.  Most recent CT scan-does not show any evidence of progression of disease.  Currently OFF chemotherapy because of stable disease/poor tolerance therapy  #Limited stage small cell lung cancer -patient is currently s/p definitive chemotherapy radiation-currently under remission.  Currently on surveillance  #Abdominal pain-unclear etiology question mesenteric ischemia versus others.  #Peripheral vascular disease  #Presyncopal/syncopal episodes-2D echo-recent normal limits.  Recommendations:  #Appreciate GI evaluation.  Await EGD/colonoscopy as planned.   #At this time based on imaging shows no obvious concerns for any progressive lung or colon malignancy.  Although patient is at extremely high risk of recurrence from his malignancies.  However await above work-up.  #Given history of presyncopal/syncopal episodes-recommend carotid Dopplers as patient has peripheral vascular disease.   Thank you Dr. Manuella Ghazi for allowing me to participate in the care of your pleasant patient. Please do not hesitate to contact me with questions or concerns in the interim.  The above plan of care was discussed with the patient.  I also called the patient's wife and discussed/updated the above plan of care.   Above plan of care was discussed with patient/family in detail.     Cammie Sickle, MD

## 2021-12-01 NOTE — Consult Note (Signed)
Kevin Lame, MD [address] [place], [name] [address] Phone: [phone] Fax : [phone]  Consultation  Referring Provider:     Dr. Ellender Hose Primary Care Physician:  [name], PA Primary Gastroenterologist:  Dr. Vicente Males         Reason for Consultation:     GI bleed  Date of Admission:  11/30/2021 Date of Consultation:  12/01/2021         HPI:   Kevin Mckay is a 71 y.o. male who was admitted with a GI bleed.  The patient has a history of colon cancer with metastases.  The patient reports that he had red stools with clots and chunks of black material.  The patient had not had a repeat colonoscopy since his colon cancer diagnosis back in 2020.  Patient also has a history of small cell lung cancer.  He had a right hemicolectomy in the past.  He reports that he has been having 3 days of increasing dark stools with fatigue nausea cold sweats and severe abdominal pain.  He reports that he came in mostly for the abdominal pain.  The patient had a CT scan of the abdomen yesterday that showed him to have perigastric and left upper quadrant venous collaterals status post partial colectomy with treated right hepatic metastases and atherosclerotic disease. The patient's hemoglobin 2 months ago was 12.4 and 4 months ago was 10.7.  The patient came in with a hemoglobin of 12.9 that is 11.9 today.  Past Medical History:  Diagnosis Date   Asthma    Cancer ([place])    Metastatic small cell lung cancer   Cerebral aneurysm    Chronic painful diabetic neuropathy (HCC)    Depression    Diabetes mellitus without complication ([place])    Essential hypertension    History of kidney stones    Occasional tremors    Tobacco use     Past Surgical History:  Procedure Laterality Date   ABDOMINAL SURGERY     age 62. trauma surgery due to forklift injury- liver and spleen   COLONOSCOPY WITH PROPOFOL N/A 03/27/2019   Procedure: COLONOSCOPY WITH PROPOFOL;  Surgeon: Jonathon Bellows, MD;   Location: [facility] ENDOSCOPY;  Service: Gastroenterology;  Laterality: N/A;   COLOSTOMY REVISION Right 05/04/2019   Procedure: COLON RESECTION RIGHT-right hemicolectomy-open;  Surgeon: Fredirick Maudlin, MD;  Location: [facility] ORS;  Service: General;  Laterality: Right;   ESOPHAGOGASTRODUODENOSCOPY (EGD) WITH PROPOFOL N/A 03/27/2019   Procedure: ESOPHAGOGASTRODUODENOSCOPY (EGD) WITH PROPOFOL;  Surgeon: Jonathon Bellows, MD;  Location: [facility] ENDOSCOPY;  Service: Gastroenterology;  Laterality: N/A;   IR GENERIC HISTORICAL  05/31/2016   IR ANGIO VERTEBRAL SEL VERTEBRAL BILAT MOD SED 05/31/2016 Consuella Lose, MD [facility]   IR GENERIC HISTORICAL  05/31/2016   IR ANGIO INTRA EXTRACRAN SEL INTERNAL CAROTID BILAT MOD SED 05/31/2016 Consuella Lose, MD [facility]   PORTACATH PLACEMENT Right 02/04/2019   Procedure: INSERTION PORT-A-CATH;  Surgeon: [name], MD;  Location: [facility] ORS;  Service: General;  Laterality: Right;   THORACOTOMY Left 01/22/2019   Procedure: PRE OP BRONCH LEFT ANTERIOR THORACOTOMY WITH BIOPSY OF MEDIASTINAL MASS;  Surgeon: [name], MD;  Location: [facility] ORS;  Service: General;  Laterality: Left;    Prior to Admission medications   Medication Sig Start Date End Date Taking? Authorizing Provider  amLODipine (NORVASC) 10 MG tablet Take 10 mg by mouth every morning.    Yes [provider]  clopidogrel (PLAVIX) 75 MG tablet Take 1 tablet (  75 mg total) by mouth daily. 01/25/21  Yes Kris Hartmann, NP  DULoxetine (CYMBALTA) 30 MG capsule Take 90 mg by mouth every morning.   Yes [provider]  gabapentin (NEURONTIN) 800 MG tablet Take 800 mg by mouth 3 (three) times daily. 04/19/20  Yes [provider]  insulin glargine (LANTUS) 100 UNIT/ML injection Inject 10 Units into the skin 2 (two) times daily. Morning and Evening if Blood Sugar is over 150   Yes [provider]  lisinopril-hydrochlorothiazide (ZESTORETIC) 10-12.5 MG tablet Take 1 tablet by mouth  daily.   Yes [provider]  metFORMIN (GLUCOPHAGE) 1000 MG tablet Take 500 mg by mouth 2 (two) times daily with a meal.   Yes [provider]  prazosin (MINIPRESS) 1 MG capsule Take 1 mg by mouth at bedtime.   Yes [provider]  pyridOXINE (VITAMIN B-6) 100 MG tablet Take 100 mg by mouth daily.   Yes [provider]  sitaGLIPtin (JANUVIA) 25 MG tablet Take 25 mg by mouth daily.   Yes [provider]  sodium chloride 1 g tablet Take 1 g by mouth 3 (three) times daily with meals. 02/01/21  Yes [provider]  budesonide-formoterol (SYMBICORT) 160-4.5 MCG/ACT inhaler Inhale 2 puffs into the lungs 2 (two) times daily.    [provider]  desonide (DESOWEN) 0.05 % cream Apply 1 application topically 2 (two) times daily as needed. Patient not taking: Reported on 11/30/2021 11/07/20   [provider]  HYDROcodone-acetaminophen (NORCO/VICODIN) 5-325 MG tablet Take 1 tablet by mouth every 12 (twelve) hours as needed for moderate pain. Patient not taking: Reported on 11/30/2021 10/03/21   Cammie Sickle, MD  Ipratropium-Albuterol (COMBIVENT) 20-100 MCG/ACT AERS respimat Inhale 1 puff into the lungs every 6 (six) hours as needed. 07/22/19 10/03/21  [provider]  lidocaine-prilocaine (EMLA) cream Apply 1 application. topically as needed. 30-45 mins prior to port access. 10/03/21   Cammie Sickle, MD  lovastatin (MEVACOR) 20 MG tablet Take 20 mg by mouth every evening. Patient not taking: Reported on 11/07/2021    [provider]  omeprazole (PRILOSEC) 40 MG capsule Take 1 capsule (40 mg total) by mouth daily. Patient not taking: Reported on 11/30/2021 03/19/19   Jonathon Bellows, MD  ondansetron (ZOFRAN) 8 MG tablet Take 1 tablet (8 mg total) by mouth every 8 (eight) hours as needed for nausea or vomiting (start 3 days; after chemo). Patient not taking: Reported on 11/07/2021 01/29/19   Cammie Sickle, MD   polyethylene glycol powder (MIRALAX) 17 GM/SCOOP powder Mix full container in 64 ounces of Gatorade or other clear liquid. NO RED Liquids Patient not taking: Reported on 11/07/2021 04/09/19   Fredirick Maudlin, MD    Family History  Problem Relation Age of Onset   Lymphoma Father    Liver cancer Paternal Uncle    Lung cancer Paternal Uncle    Lung cancer Maternal Uncle      Social History   Tobacco Use   Smoking status: Every Day    Packs/day: 0.50    Years: 50.00    Total pack years: 25.00    Types: Cigarettes   Smokeless tobacco: Never  Vaping Use   Vaping Use: Never used  Substance Use Topics   Alcohol use: No   Drug use: Yes    Types: Barbituates, Marijuana    Allergies as of 11/30/2021 - Review Complete 11/30/2021  Allergen Reaction Noted   Ferumoxytol Shortness Of Breath and Other (  See Comments) 03/24/2019   Amoxicillin Hives 03/31/2015   Codeine Nausea Only 03/31/2015    Review of Systems:    All systems reviewed and negative except where noted in HPI.   Physical Exam:  Vital signs in last 24 hours: Temp:  [97.7 F (36.5 C)-98.3 F (36.8 C)] 98 F (36.7 C) (06/09 1200) Pulse Rate:  [59-72] 64 (06/09 1200) Resp:  [16-20] 18 (06/09 1200) BP: (112-191)/(56-77) 159/67 (06/09 1200) SpO2:  [96 %-100 %] 97 % (06/09 1200) Weight:  [57.4 kg-57.6 kg] 57.6 kg (06/09 0448) Last BM Date : 11/28/21 General:   Pleasant, cooperative in NAD Head:  Normocephalic and atraumatic. Eyes:   No icterus.   Conjunctiva pink. PERRLA. Ears:  Normal auditory acuity. Neck:  Supple; no masses or thyroidomegaly Lungs: Respirations even and unlabored. Lungs clear to auscultation bilaterally.   No wheezes, crackles, or rhonchi.  Heart:  Regular rate and rhythm;  Without murmur, clicks, rubs or gallops Abdomen:  Soft, nondistended, diffusely tender. Normal bowel sounds. No appreciable masses or hepatomegaly.  No rebound or guarding.  Rectal:  Not performed. Msk:  Symmetrical without  gross deformities.    Extremities:  Without edema, cyanosis or clubbing. Neurologic:  Alert and oriented x3;  grossly normal neurologically. Skin:  Intact without significant lesions or rashes. Cervical Nodes:  No significant cervical adenopathy. Psych:  Alert and cooperative. Normal affect.  LAB RESULTS: Recent Labs    11/30/21 1320 12/01/21 0641  WBC 6.9 5.8  HGB 12.9* 11.9*  HCT 38.7* 36.5*  PLT 202 167   BMET Recent Labs    11/30/21 1320 12/01/21 0641  NA 133* 138  K 4.8 4.2  CL 103 108  CO2 22 24  GLUCOSE 249* 141*  BUN 18 18  CREATININE 1.11 0.98  CALCIUM 9.9 9.1   LFT Recent Labs    12/01/21 0641  PROT 6.8  ALBUMIN 3.7  AST 21  ALT 14  ALKPHOS 49  BILITOT 0.6   PT/INR Recent Labs    11/30/21 1640  LABPROT 13.5  INR 1.0    STUDIES: CT ABDOMEN PELVIS W CONTRAST  Result Date: 11/30/2021 CLINICAL DATA:  Abdominal pain, acute, nonlocalized. two episodes of dark tarry stools this week. Previously treated for colon cancer. Also reports dizziness, and indigestion. Reports poor appetite for the last three days due to nausea and epigastric pain. EXAM: CT ABDOMEN AND PELVIS WITH CONTRAST TECHNIQUE: Multidetector CT imaging of the abdomen and pelvis was performed using the standard protocol following bolus administration of intravenous contrast. RADIATION DOSE REDUCTION: This exam was performed according to the departmental dose-optimization program which includes automated exposure control, adjustment of the mA and/or kV according to patient size and/or use of iterative reconstruction technique. CONTRAST:  166mL OMNIPAQUE IOHEXOL 300 MG/ML  SOLN COMPARISON:  CT abdomen pelvis 12/07/2020 FINDINGS: Lower chest: Similar-appearing likely distal esophageal wall lipomatous lesion (2:9). No acute abnormality. Hepatobiliary: Vague peripheral right hepatic lobe hypodensity (2:11) consistent with a biopsy-proven metastasis. No gallstones, gallbladder wall thickening, or  pericholecystic fluid. No biliary dilatation. Pancreas: No focal lesion. Normal pancreatic contour. No surrounding inflammatory changes. No main pancreatic ductal dilatation. Spleen: Normal in size without focal abnormality. Adrenals/Urinary Tract: No adrenal nodule bilaterally. Bilateral kidneys enhance symmetrically. A 1.2 cm fluid density lesion within left kidney statistically likely represents a simple renal cyst. Simple renal cysts, in the absence of clinically indicated signs/symptoms, require no independent follow-up. No hydronephrosis. No hydroureter. The urinary bladder is unremarkable. On delayed imaging, there is no  urothelial wall thickening and there are no filling defects in the opacified portions of the bilateral collecting systems or ureters. Stomach/Bowel: Partial colectomy involving the right colon. Stomach is within normal limits. No evidence of bowel wall thickening or dilatation. Scattered colonic diverticulosis. Appendix appears normal. Vascular/Lymphatic: The main portal, splenic, superior mesenteric veins are patent. Perigastric and left upper quadrant venous collaterals. No abdominal aorta or iliac aneurysm. Severe atherosclerotic plaque of the aorta and its branches. No abdominal, pelvic, or inguinal lymphadenopathy. Reproductive: Prostate is unremarkable. Other: No intraperitoneal free fluid. No intraperitoneal free gas. No organized fluid collection. Musculoskeletal: No abdominal wall hernia or abnormality. No suspicious lytic or blastic osseous lesions. No acute displaced fracture. L5-S1 intervertebral disc space vacuum phenomenon and endplate sclerosis as well as posterior disc osteophyte complex formation, osteophyte formation, facet arthropathy. IMPRESSION: 1. Perigastric and left upper quadrant venous collaterals. Limited evaluation for an active gastrointestinal hemorrhage on this portal venous study. 2. Status post partial colectomy with treated right hepatic metastasis. 3. Aortic  Atherosclerosis (ICD10-I70.0) - severe. Electronically Signed   By: Iven Finn M.D.   On: 11/30/2021 17:23   CT HEAD WO CONTRAST (5MM)  Result Date: 11/30/2021 CLINICAL DATA:  Head trauma, minor (Age >= 65y) EXAM: CT HEAD WITHOUT CONTRAST TECHNIQUE: Contiguous axial images were obtained from the base of the skull through the vertex without intravenous contrast. RADIATION DOSE REDUCTION: This exam was performed according to the departmental dose-optimization program which includes automated exposure control, adjustment of the mA and/or kV according to patient size and/or use of iterative reconstruction technique. COMPARISON:  MRI head 02/04/2019 FINDINGS: Brain: No evidence of large-territorial acute infarction. No parenchymal hemorrhage. No mass lesion. No extra-axial collection. No mass effect or midline shift. No hydrocephalus. Basilar cisterns are patent. Vascular: No hyperdense vessel. Atherosclerotic calcifications are present within the cavernous internal carotid and vertebral arteries. Skull: No acute fracture or focal lesion. Sinuses/Orbits: Paranasal sinuses and mastoid air cells are clear. The orbits are unremarkable. Other: None. IMPRESSION: No acute intracranial abnormality. Electronically Signed   By: Iven Finn M.D.   On: 11/30/2021 17:12      Impression / Plan:   Assessment: Principal Problem:   Abdominal pain Active Problems:   Essential hypertension   Type 2 diabetes mellitus with diabetic neuropathy (HCC)   Tobacco use   PAD (peripheral artery disease) (HCC)   Rectal bleeding   Kevin Mckay is a 71 y.o. y/o male with comes in with report of GI bleeding with a history of colon cancer and a mixture of black stools with bloody stools.  The patient's hemoglobin has been stable.  The patient has had no further GI follow-up since being diagnosed with colon cancer and having his colon removed.  Plan:  The patient will be set up for an upper endoscopy and colonoscopy.   The patient has been given the option of having the upper endoscopy today and prep for the colonoscopy for tomorrow but he states that he would rather have them both done at the same time.  The patient will be prepped for an EGD and colonoscopy and have it done by Dr. Haig Prophet tomorrow.  The patient has been explained the plan and agrees with it.  Thank you for involving me in the care of this patient.      LOS: 0 days   Kevin Lame, MD, [facility] 12/01/2021, 1:21 PM,  Pager [phone] 7am-5pm  Check AMION for 5pm -7am coverage and on weekends   Note: This dictation was  prepared with Dragon dictation along with smaller phrase technology. Any transcriptional errors that result from this process are unintentional.

## 2021-12-01 NOTE — Plan of Care (Signed)
  Problem: Education: Goal: Knowledge of General Education information will improve Description: Including pain rating scale, medication(s)/side effects and non-pharmacologic comfort measures Outcome: Progressing   Problem: Clinical Measurements: Goal: Will remain free from infection Outcome: Progressing   Problem: Clinical Measurements: Goal: Respiratory complications will improve Outcome: Progressing   Problem: Activity: Goal: Risk for activity intolerance will decrease Outcome: Progressing   Problem: Pain Managment: Goal: General experience of comfort will improve Outcome: Progressing   Problem: Safety: Goal: Ability to remain free from injury will improve Outcome: Progressing

## 2021-12-01 NOTE — Progress Notes (Addendum)
Pt plan 12/02/21 is to do EGD and colonoscopy in the morning and is NPO midnight.Per order from GI to notify is pt complaints of abdominal pain. Pt is complaining now of abdominal and cramping pain.7/10  Pt abdomen was soft and not distended on assessment. NP Randol Kern and MD Locklear made aware and MD Loackler to let pt know to finish the colonoscopy prep by 0500 and okay to give the PRN morphine at this time. Will continue to monitor.  Update 0500: Colonoscopy prep completed. Will continue to monitor.  Update 0700: [facility] given report and states they'll able to take pt at 1000. Pt made aware. Will continue to monitor.

## 2021-12-01 NOTE — TOC Initial Note (Signed)
Transition of Care [facility]) - Initial/Assessment Note    Patient Details  Name: Kevin Mckay MRN: [id number] Date of Birth: March 04, 1951  Transition of Care [facility]) CM/SW Contact:    Laurena Slimmer, RN Phone Number: 12/01/2021, 2:28 PM  Clinical Narrative:                  Transition of Care (TOC) Screening Note   Patient Details  Name: Kevin Mckay Date of Birth: 04-Mar-1951   Transition of Care [facility]) CM/SW Contact:    Laurena Slimmer, RN Phone Number: 12/01/2021, 2:28 PM    Transition of Care Department [facility]) has reviewed patient and no TOC needs have been identified at this time. We will continue to monitor patient advancement through interdisciplinary progression rounds. If new patient transition needs arise, please place a TOC consult.          Patient Goals and CMS Choice        Expected Discharge Plan and Services                                                Prior Living Arrangements/Services                       Activities of Daily Living Home Assistive Devices/Equipment: None ADL Screening (condition at time of admission) Patient's cognitive ability adequate to safely complete daily activities?: Yes Is the patient deaf or have difficulty hearing?: No Does the patient have difficulty seeing, even when wearing glasses/contacts?: No Does the patient have difficulty concentrating, remembering, or making decisions?: No Patient able to express need for assistance with ADLs?: Yes Does the patient have difficulty dressing or bathing?: No Independently performs ADLs?: Yes (appropriate for developmental age) Does the patient have difficulty walking or climbing stairs?: Yes Weakness of Legs: Both (more on right leg) Weakness of Arms/Hands: Left  Permission Sought/Granted                  Emotional Assessment              Admission diagnosis:  Abdominal pain [R10.9] Patient Active Problem List   Diagnosis Date Noted    Abdominal pain 11/30/2021   PAD (peripheral artery disease) ([place]) 11/30/2021   Rectal bleeding 11/30/2021   Atherosclerosis of native arteries of extremity with intermittent claudication ([place]) 10/25/2021   Goals of care, counseling/discussion 11/05/2019   S/P right colectomy 05/04/2019   Cancer of ascending colon ([place])    Melena    Colonic mass    Lung mass    Iron deficiency 02/03/2019   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 ([place]) 01/29/2019   Mediastinal mass 01/09/2019   Neuropathy 08/28/2018   Tremor 08/28/2018   Abnormal brain MRI 08/11/2015   Essential hypertension 04/01/2015   Type 2 diabetes mellitus with diabetic neuropathy ([place]) 04/01/2015   Subdural hematoma ([place]) 04/01/2015   Tobacco use 04/01/2015   PCP:  [name], PA Pharmacy:   [facility] [address] [place], [place] [address] Phone: [phone] Fax: [phone]  [place] [address] [place] [address] Phone: [phone] Fax: [place], [place]  [place] [address] Phone: [phone] Fax: [phone]     Social Determinants of Health (SDOH) Interventions    Readmission Risk Interventions     No data to display

## 2021-12-02 ENCOUNTER — Observation Stay: Payer: BC Managed Care – PPO | Admitting: Anesthesiology

## 2021-12-02 ENCOUNTER — Encounter
Admission: EM | Disposition: A | Discharge status: Home / Self Care | Payer: Self-pay | Source: Home / Self Care | Attending: Emergency Medicine

## 2021-12-02 ENCOUNTER — Observation Stay: Payer: BC Managed Care – PPO

## 2021-12-02 DIAGNOSIS — K922 Gastrointestinal hemorrhage, unspecified: Secondary | ICD-10-CM

## 2021-12-02 DIAGNOSIS — R1084 Generalized abdominal pain: Secondary | ICD-10-CM | POA: Diagnosis not present

## 2021-12-02 DIAGNOSIS — C182 Malignant neoplasm of ascending colon: Secondary | ICD-10-CM | POA: Diagnosis not present

## 2021-12-02 DIAGNOSIS — I10 Essential (primary) hypertension: Secondary | ICD-10-CM | POA: Diagnosis not present

## 2021-12-02 HISTORY — PX: ESOPHAGOGASTRODUODENOSCOPY: SHX5428

## 2021-12-02 HISTORY — PX: COLONOSCOPY WITH PROPOFOL: SHX5780

## 2021-12-02 LAB — CBC
HCT: 32.5 % — ABNORMAL LOW (ref 39.0–52.0)
Hemoglobin: 11 g/dL — ABNORMAL LOW (ref 13.0–17.0)
MCH: 27.8 pg (ref 26.0–34.0)
MCHC: 33.8 g/dL (ref 30.0–36.0)
MCV: 82.1 fL (ref 80.0–100.0)
Platelets: 161 10*3/uL (ref 150–400)
RBC: 3.96 MIL/uL — ABNORMAL LOW (ref 4.22–5.81)
RDW: 15.5 % (ref 11.5–15.5)
WBC: 5.9 10*3/uL (ref 4.0–10.5)
nRBC: 0 % (ref 0.0–0.2)

## 2021-12-02 LAB — BASIC METABOLIC PANEL
Anion gap: 6 (ref 5–15)
BUN: 10 mg/dL (ref 8–23)
CO2: 24 mmol/L (ref 22–32)
Calcium: 8.8 mg/dL — ABNORMAL LOW (ref 8.9–10.3)
Chloride: 107 mmol/L (ref 98–111)
Creatinine, Ser: 0.62 mg/dL (ref 0.61–1.24)
GFR, Estimated: >60 mL/min (ref >60)
Glucose, Bld: 152 mg/dL — ABNORMAL HIGH (ref 70–99)
Potassium: 3.8 mmol/L (ref 3.5–5.1)
Sodium: 137 mmol/L (ref 135–145)

## 2021-12-02 SURGERY — COLONOSCOPY WITH PROPOFOL
Anesthesia: General

## 2021-12-02 MED ORDER — PROPOFOL 10 MG/ML IV BOLUS
INTRAVENOUS | Status: AC
  Filled 2021-12-02: qty 20

## 2021-12-02 MED ORDER — EPHEDRINE 5 MG/ML INJ
INTRAVENOUS | Status: AC
  Filled 2021-12-02: qty 5

## 2021-12-02 MED ORDER — LIDOCAINE HCL (CARDIAC) PF 100 MG/5ML IV SOSY
PREFILLED_SYRINGE | INTRAVENOUS | Status: DC | PRN
  Administered 2021-12-02: 60 mg via INTRAVENOUS

## 2021-12-02 MED ORDER — EPHEDRINE SULFATE (PRESSORS) 50 MG/ML IJ SOLN
INTRAMUSCULAR | Status: DC | PRN
  Administered 2021-12-02 (×2): 5 mg via INTRAVENOUS

## 2021-12-02 MED ORDER — PHENYLEPHRINE HCL (PRESSORS) 10 MG/ML IV SOLN
INTRAVENOUS | Status: DC | PRN
  Administered 2021-12-02 (×2): 80 ug via INTRAVENOUS

## 2021-12-02 MED ORDER — PHENYLEPHRINE 80 MCG/ML (10ML) SYRINGE FOR IV PUSH (FOR BLOOD PRESSURE SUPPORT)
PREFILLED_SYRINGE | INTRAVENOUS | Status: AC
  Filled 2021-12-02: qty 10

## 2021-12-02 MED ORDER — DEXMEDETOMIDINE (PRECEDEX) IN NS 20 MCG/5ML (4 MCG/ML) IV SYRINGE
PREFILLED_SYRINGE | INTRAVENOUS | Status: DC | PRN
  Administered 2021-12-02: 8 ug via INTRAVENOUS

## 2021-12-02 MED ORDER — IOHEXOL 350 MG/ML SOLN
100.0000 mL | Freq: Once | INTRAVENOUS | Status: AC | PRN
  Administered 2021-12-02: 100 mL via INTRAVENOUS

## 2021-12-02 MED ORDER — PROPOFOL 500 MG/50ML IV EMUL
INTRAVENOUS | Status: DC | PRN
  Administered 2021-12-02: 140 ug/kg/min via INTRAVENOUS

## 2021-12-02 MED ORDER — PROPOFOL 10 MG/ML IV BOLUS
INTRAVENOUS | Status: DC | PRN
  Administered 2021-12-02: 70 mg via INTRAVENOUS

## 2021-12-02 NOTE — Discharge Summary (Signed)
Physician Discharge Summary   Patient: Kevin Mckay MRN: [id number] DOB: May 24, 1951  Admit date:     11/30/2021  Discharge date: 12/02/21  Discharge Physician: Max Sane   PCP: [name], PA   Recommendations at discharge:   Follow-up with outpatient providers as requested  Discharge Diagnoses: Principal Problem:   Abdominal pain Active Problems:   Essential hypertension   Type 2 diabetes mellitus with diabetic neuropathy (HCC)   Tobacco use   Cancer of right colon (HCC)   PAD (peripheral artery disease) (HCC)   Rectal bleeding   Gastrointestinal hemorrhage  Hospital Course: 71 year old male with 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  Oncology consult pending  Assessment and Plan: * Abdominal pain Status post EGD and colonoscopy today showing Non-obstructing Schatzki ring, a single submucosal papule (nodule) found in the stomach and normal duodenum.  Also underwent CT angio abdomen and pelvis which was reviewed by vascular surgery Dr. Lorenso Courier who recommends outpatient angiogram with Dr. Corene Cornea dew.  Overall picture worrisome for pancreatic mass/cancer as potential etiology -Case discussed with GI, oncology and vascular surgery.  Patient and wife are adamant in wanting to leave home as his pain is much better controlled. -He will need further outpatient work-up including endoscopic ultrasound.  Dr. Rogue Bussing is scheduling him to be seen at cancer center and will coordinate further care as an outpatient -Patient has been requested to make an appointment with Dr. Leotis Pain vascular surgery as an outpatient                        Rectal bleeding H&H remained stable.  Colonoscopy showed some diverticulosis but no obvious bleed.  Patient did not have any rectal bleed while in the hospital  PAD (peripheral artery disease) [facility]) Outpatient  vascular surgery follow-up  Tobacco use counseled about danger of ongoing smoking.   Type 2 diabetes mellitus with diabetic neuropathy (HCC) Pt's a1c more than 2 year ago was 9.5  Essential hypertension Controlled          Consultants: GI, oncology and curbside vascular surgery Procedures performed: EGD and colonoscopy Disposition: Home Diet recommendation:  Discharge Diet Orders (From admission, onward)     Start     Ordered   12/02/21 0000  Diet - low sodium heart healthy        12/02/21 1300           Carb modified diet DISCHARGE MEDICATION: Allergies as of 12/02/2021       Reactions   Ferumoxytol Shortness Of Breath, Other (See Comments)   Infusion reaction-shortness of breath/ tachycardia/hypotension.   Amoxicillin Hives   Did it involve swelling of the face/tongue/throat, SOB, or low BP? No Did it involve sudden or severe rash/hives, skin peeling, or any reaction on the inside of your mouth or nose? Yes Did you need to seek medical attention at a hospital or doctor's office? No When did it last happen?~5 years ago If all above answers are "NO", may proceed with cephalosporin use.   Codeine Nausea Only        Medication List     STOP taking these medications    desonide 0.05 % cream Commonly known as: DESOWEN   HYDROcodone-acetaminophen 5-325 MG tablet Commonly known as: NORCO/VICODIN   lovastatin 20 MG tablet Commonly known as: MEVACOR   omeprazole 40  MG capsule Commonly known as: PRILOSEC   ondansetron 8 MG tablet Commonly known as: ZOFRAN   polyethylene glycol powder 17 GM/SCOOP powder Commonly known as: MiraLax       TAKE these medications    amLODipine 10 MG tablet Commonly known as: NORVASC Take 10 mg by mouth every morning.   budesonide-formoterol 160-4.5 MCG/ACT inhaler Commonly known as: SYMBICORT Inhale 2 puffs into the lungs 2 (two) times daily.   clopidogrel 75 MG tablet Commonly known as: PLAVIX Take 1 tablet  (75 mg total) by mouth daily.   DULoxetine 30 MG capsule Commonly known as: CYMBALTA Take 90 mg by mouth every morning.   gabapentin 800 MG tablet Commonly known as: NEURONTIN Take 800 mg by mouth 3 (three) times daily.   insulin glargine 100 UNIT/ML injection Commonly known as: LANTUS Inject 10 Units into the skin 2 (two) times daily. Morning and Evening if Blood Sugar is over 150   Ipratropium-Albuterol 20-100 MCG/ACT Aers respimat Commonly known as: COMBIVENT Inhale 1 puff into the lungs every 6 (six) hours as needed.   lidocaine-prilocaine cream Commonly known as: EMLA Apply 1 application. topically as needed. 30-45 mins prior to port access.   lisinopril-hydrochlorothiazide 10-12.5 MG tablet Commonly known as: ZESTORETIC Take 1 tablet by mouth daily.   metFORMIN 1000 MG tablet Commonly known as: GLUCOPHAGE Take 500 mg by mouth 2 (two) times daily with a meal.   prazosin 1 MG capsule Commonly known as: MINIPRESS Take 1 mg by mouth at bedtime.   pyridOXINE 100 MG tablet Commonly known as: VITAMIN B-6 Take 100 mg by mouth daily.   sitaGLIPtin 25 MG tablet Commonly known as: JANUVIA Take 25 mg by mouth daily.   sodium chloride 1 g tablet Take 1 g by mouth 3 (three) times daily with meals.        Follow-up Information     [name], PA. Schedule an appointment as soon as possible for a visit in 3 day(s).   Specialty: Physician Assistant Why: [facility] Discharge F/UP Contact information: [place] [address] [phone]         Algernon Huxley, MD. Schedule an appointment as soon as possible for a visit in 2 week(s).   Specialties: Vascular Surgery, Radiology, Interventional Cardiology Why: [facility] Discharge F/UP Contact information: [place] [address] [phone]         Cammie Sickle, MD. Schedule an appointment as soon as possible for a visit in 1 week(s).   Specialties: Internal  Medicine, Oncology Why: [facility] Discharge F/UP Contact information: [place] [address] [phone]                Discharge Exam: Danley Danker Weights   11/30/21 2215 12/01/21 0448 12/02/21 0323  Weight: 57.4 kg 57.6 kg 62 kg   71 year old male lying in the bed in no acute distress Lungs clear to auscultation bilaterally Abdomen soft, benign Heart regular rate and rhythm Neuro alert and oriented, nonfocal Skin no rash or lesion Psych normal mood and affect  Condition at discharge: fair  The results of significant diagnostics from this hospitalization (including imaging, microbiology, ancillary and laboratory) are listed below for reference.   Imaging Studies: US Carotid Bilateral  Result Date: 12/02/2021 CLINICAL DATA:  Hypertension, syncope and dizziness EXAM: BILATERAL CAROTID DUPLEX ULTRASOUND TECHNIQUE: Pearline Cables scale imaging, color Doppler and duplex ultrasound were performed of bilateral carotid and vertebral arteries in the neck. COMPARISON:  None  Available. FINDINGS: Criteria: Quantification of carotid stenosis is based on velocity parameters that correlate the residual internal carotid diameter with NASCET-based stenosis levels, using the diameter of the distal internal carotid lumen as the denominator for stenosis measurement. The following velocity measurements were obtained: RIGHT ICA: 129/23 cm/sec CCA: 132/44 cm/sec SYSTOLIC ICA/CCA RATIO:  1.0 ECA: 97 cm/sec LEFT ICA: 112/30 cm/sec CCA: 010/27 cm/sec SYSTOLIC ICA/CCA RATIO:  0.9 ECA: 93 cm/sec RIGHT CAROTID ARTERY: Calcified bifurcation atherosclerosis. Despite this, no significant stenosis, velocity elevation, turbulent flow. No significant waveform abnormality. Degree of narrowing estimated less than 50% by ultrasound criteria. RIGHT VERTEBRAL ARTERY:  Normal antegrade flow LEFT CAROTID ARTERY: Slightly less calcified bifurcation atherosclerosis. Negative for significant stenosis, velocity  elevation, turbulent flow. No significant waveform abnormality. Degree of narrowing also less than 50% by ultrasound criteria. LEFT VERTEBRAL ARTERY:  Normal antegrade flow IMPRESSION: Bilateral carotid atherosclerosis. Negative for stenosis. Degree of narrowing less than 50% bilaterally by ultrasound criteria. Patent antegrade vertebral flow bilaterally Electronically Signed   By: Jerilynn Mages.  Shick M.D.   On: 12/02/2021 13:56   CT Angio Abd/Pel w/ and/or w/o  Result Date: 12/02/2021 CLINICAL DATA:  Abdominal pain and rectal bleeding. Concern for mesenteric ischemia. EXAM: CTA ABDOMEN AND PELVIS WITHOUT AND WITH CONTRAST TECHNIQUE: Multidetector CT imaging of the abdomen and pelvis was performed using the standard protocol during bolus administration of intravenous contrast. Multiplanar reconstructed images and MIPs were obtained and reviewed to evaluate the vascular anatomy. RADIATION DOSE REDUCTION: This exam was performed according to the departmental dose-optimization program which includes automated exposure control, adjustment of the mA and/or kV according to patient size and/or use of iterative reconstruction technique. CONTRAST:  133mL OMNIPAQUE IOHEXOL 350 MG/ML SOLN COMPARISON:  CT AP 11/30/2021 FINDINGS: VASCULAR Aorta: Normal caliber aorta without aneurysm, dissection, vasculitis or significant stenosis. Extensive aortic atherosclerotic calcifications. Celiac: Patent without evidence of aneurysm, dissection, vasculitis or significant stenosis. SMA: Patent without evidence of aneurysm, dissection, vasculitis or significant stenosis. Renals: Both renal arteries are patent without evidence of aneurysm, dissection, vasculitis, fibromuscular dysplasia or significant stenosis. There is a short segment IMA: High-grade, nonocclusive luminal narrowing secondary to calcified plaque within the proximal IMA, image 96/5. A second area of high-grade, nonocclusive luminal narrowing is noted just beyond the bifurcation of  the IMA, image 111/5. The branches of the IMA appear well opacified beyond this area of stenosis. Inflow: Patent without evidence of aneurysm, dissection, vasculitis or significant stenosis. Proximal Outflow: Atherosclerotic calcifications involve bilateral common iliac arteries and their branches without occlusion. Veins: Extensive left upper quadrant varicosities are identified. Review of the MIP images confirms the above findings. NON-VASCULAR Lower chest: No acute abnormality. Hepatobiliary: Unchanged appearance of previously characterized subcapsular metastasis within the periphery of the right hepatic lobe measuring 1.6 x 0.9 cm, image 31/12. No new focal liver abnormalities. Gallbladder is unremarkable. Pancreas: On the arterial phase images there is hypoenhancement involving the neck of the pancreas measuring 3.0 x 2.8 by 2.4 cm. Additionally, there is focal luminal narrowing of the portal vein just beyond the portal venous confluence, image 52/2 which when compared with remote study from 06/07/2020 is a new finding. This is moderately suspicious for underlying neoplastic process involving the pancreas. Body and tail of pancreas are chronically atrophic or congenitally absent. Spleen: Normal in size without focal abnormality. Adrenals/Urinary Tract: Normal appearance of the adrenal glands. No suspicious kidney mass or hydronephrosis identified. Urinary bladder appears normal. Stomach/Bowel: The stomach appears within normal limits. Postoperative changes from previous right hemicolectomy.  Enterocolonic anastomosis has been performed in the right hemiabdomen. The colon appears diffusely decompressed. No significant bowel wall thickening or inflammation. No pathologic dilatation of the abdomen or pelvis. Sigmoid diverticulosis noted without signs of acute diverticulitis. No signs of arterial phase intraluminal contrast of extravasation of intravenous contrast material to suggest active bleeding. On the delayed  phase imaging there is no pooling of intravenous contrast material within the large or small bowel loops. Lymphatic: No signs abdominopelvic adenopathy identified. Reproductive: Prostate gland appears enlarged. Other: No free fluid or fluid collections within the abdomen or pelvis. No signs of pneumoperitoneum. Musculoskeletal: Degenerative disc disease noted within the lumbar spine. IMPRESSION: 1. No signs of arterial phase intraluminal contrast extravasation of intravenous contrast material to suggest active bleeding. On the delayed phase imaging there is no pooling of intravenous contrast material within the large or small bowel loops. 2. High-grade, nonocclusive luminal narrowing of the proximal IMA. A second focus of high-grade luminal narrowing due to calcified plaque is noted within a proximal branch of the IMA to the rectum. The distal branches of the IMA appear well opacified beyond these areas of stenosis. 3. There is a subtle area hypoenhancement involving the neck of pancreas which results in focal luminal narrowing of the portal vein just beyond the portal venous confluence. This is a new finding from 11/30/2021 and is moderately suspicious. Underlying neoplastic process cannot be excluded. When the patient is clinically stable, is able to suspend respirations and remain motionless consider more definitive characterization with contrast enhanced liver protocol MRI. Alternatively, a restaging PET-CT may be helpful to assess for abnormal hypermetabolism within this area in this patient known liver metastasis. 4. Stable appearance of subcapsular metastasis within the periphery of the right hepatic lobe. 5. Status post right hemicolectomy with ileocolic anastomosis. 6. Sigmoid diverticulosis without signs of acute diverticulitis. 7. Enlarged prostate gland. 8. Aortic Atherosclerosis (ICD10-I70.0). Electronically Signed   By: Kerby Moors M.D.   On: 12/02/2021 12:40   CT ABDOMEN PELVIS W  CONTRAST  Result Date: 11/30/2021 CLINICAL DATA:  Abdominal pain, acute, nonlocalized. two episodes of dark tarry stools this week. Previously treated for colon cancer. Also reports dizziness, and indigestion. Reports poor appetite for the last three days due to nausea and epigastric pain. EXAM: CT ABDOMEN AND PELVIS WITH CONTRAST TECHNIQUE: Multidetector CT imaging of the abdomen and pelvis was performed using the standard protocol following bolus administration of intravenous contrast. RADIATION DOSE REDUCTION: This exam was performed according to the departmental dose-optimization program which includes automated exposure control, adjustment of the mA and/or kV according to patient size and/or use of iterative reconstruction technique. CONTRAST:  193mL OMNIPAQUE IOHEXOL 300 MG/ML  SOLN COMPARISON:  CT abdomen pelvis 12/07/2020 FINDINGS: Lower chest: Similar-appearing likely distal esophageal wall lipomatous lesion (2:9). No acute abnormality. Hepatobiliary: Vague peripheral right hepatic lobe hypodensity (2:11) consistent with a biopsy-proven metastasis. No gallstones, gallbladder wall thickening, or pericholecystic fluid. No biliary dilatation. Pancreas: No focal lesion. Normal pancreatic contour. No surrounding inflammatory changes. No main pancreatic ductal dilatation. Spleen: Normal in size without focal abnormality. Adrenals/Urinary Tract: No adrenal nodule bilaterally. Bilateral kidneys enhance symmetrically. A 1.2 cm fluid density lesion within left kidney statistically likely represents a simple renal cyst. Simple renal cysts, in the absence of clinically indicated signs/symptoms, require no independent follow-up. No hydronephrosis. No hydroureter. The urinary bladder is unremarkable. On delayed imaging, there is no urothelial wall thickening and there are no filling defects in the opacified portions of the bilateral collecting systems or  ureters. Stomach/Bowel: Partial colectomy involving the right  colon. Stomach is within normal limits. No evidence of bowel wall thickening or dilatation. Scattered colonic diverticulosis. Appendix appears normal. Vascular/Lymphatic: The main portal, splenic, superior mesenteric veins are patent. Perigastric and left upper quadrant venous collaterals. No abdominal aorta or iliac aneurysm. Severe atherosclerotic plaque of the aorta and its branches. No abdominal, pelvic, or inguinal lymphadenopathy. Reproductive: Prostate is unremarkable. Other: No intraperitoneal free fluid. No intraperitoneal free gas. No organized fluid collection. Musculoskeletal: No abdominal wall hernia or abnormality. No suspicious lytic or blastic osseous lesions. No acute displaced fracture. L5-S1 intervertebral disc space vacuum phenomenon and endplate sclerosis as well as posterior disc osteophyte complex formation, osteophyte formation, facet arthropathy. IMPRESSION: 1. Perigastric and left upper quadrant venous collaterals. Limited evaluation for an active gastrointestinal hemorrhage on this portal venous study. 2. Status post partial colectomy with treated right hepatic metastasis. 3. Aortic Atherosclerosis (ICD10-I70.0) - severe. Electronically Signed   By: Iven Finn M.D.   On: 11/30/2021 17:23   CT HEAD WO CONTRAST (5MM)  Result Date: 11/30/2021 CLINICAL DATA:  Head trauma, minor (Age >= 65y) EXAM: CT HEAD WITHOUT CONTRAST TECHNIQUE: Contiguous axial images were obtained from the base of the skull through the vertex without intravenous contrast. RADIATION DOSE REDUCTION: This exam was performed according to the departmental dose-optimization program which includes automated exposure control, adjustment of the mA and/or kV according to patient size and/or use of iterative reconstruction technique. COMPARISON:  MRI head 02/04/2019 FINDINGS: Brain: No evidence of large-territorial acute infarction. No parenchymal hemorrhage. No mass lesion. No extra-axial collection. No mass effect or  midline shift. No hydrocephalus. Basilar cisterns are patent. Vascular: No hyperdense vessel. Atherosclerotic calcifications are present within the cavernous internal carotid and vertebral arteries. Skull: No acute fracture or focal lesion. Sinuses/Orbits: Paranasal sinuses and mastoid air cells are clear. The orbits are unremarkable. Other: None. IMPRESSION: No acute intracranial abnormality. Electronically Signed   By: Iven Finn M.D.   On: 11/30/2021 17:12   ECHOCARDIOGRAM COMPLETE  Result Date: 11/13/2021    ECHOCARDIOGRAM REPORT   Patient Name:   LIBERTY STEAD Date of Exam: 11/13/2021 Medical Rec #:  [id number]       Height:       68.0 in Accession #:    [id number]      Weight:       131.8 lb Date of Birth:  [birth year]      BSA:          1.712 m Patient Age:    71 years        BP:           153/79 mmHg Patient Gender: M               HR:           66 bpm. Exam Location:  [facility] Procedure: 2D Echo, Cardiac Doppler and Color Doppler Indications:     R55 Syncope  History:         Patient has no prior history of Echocardiogram examinations.                  Asthma, Lung cancer, Colon cancer; Risk Factors:Current Smoker,                  Hypertension and Diabetes.  Sonographer:     Rosalia Hammers Referring Phys:  [id number] Cammie Sickle Diagnosing Phys: Kathlyn Sacramento MD  Sonographer Comments: Suboptimal parasternal window and suboptimal subcostal window. Image acquisition  challenging due to patient body habitus and Image acquisition challenging due to respiratory motion. IMPRESSIONS  1. Left ventricular ejection fraction, by estimation, is 60 to 65%. The left ventricle has normal function. The left ventricle has no regional wall motion abnormalities. There is mild left ventricular hypertrophy. Left ventricular diastolic parameters were normal.  2. Right ventricular systolic function is normal. The right ventricular size is normal. Tricuspid regurgitation signal is inadequate for assessing PA pressure.   3. The mitral valve is normal in structure. No evidence of mitral valve regurgitation. No evidence of mitral stenosis.  4. The aortic valve is normal in structure. Aortic valve regurgitation is not visualized. Aortic valve sclerosis/calcification is present, without any evidence of aortic stenosis.  5. The inferior vena cava is normal in size with greater than 50% respiratory variability, suggesting right atrial pressure of 3 mmHg. FINDINGS  Left Ventricle: Left ventricular ejection fraction, by estimation, is 60 to 65%. The left ventricle has normal function. The left ventricle has no regional wall motion abnormalities. The left ventricular internal cavity size was normal in size. There is  mild left ventricular hypertrophy. Left ventricular diastolic parameters were normal. Right Ventricle: The right ventricular size is normal. No increase in right ventricular wall thickness. Right ventricular systolic function is normal. Tricuspid regurgitation signal is inadequate for assessing PA pressure. Left Atrium: Left atrial size was normal in size. Right Atrium: Right atrial size was normal in size. Pericardium: There is no evidence of pericardial effusion. Mitral Valve: The mitral valve is normal in structure. No evidence of mitral valve regurgitation. No evidence of mitral valve stenosis. MV peak gradient, 5.1 mmHg. The mean mitral valve gradient is 2.0 mmHg. Tricuspid Valve: The tricuspid valve is normal in structure. Tricuspid valve regurgitation is not demonstrated. No evidence of tricuspid stenosis. Aortic Valve: The aortic valve is normal in structure. Aortic valve regurgitation is not visualized. Aortic valve sclerosis/calcification is present, without any evidence of aortic stenosis. Aortic valve mean gradient measures 3.0 mmHg. Aortic valve peak  gradient measures 4.7 mmHg. Aortic valve area, by VTI measures 2.73 cm. Pulmonic Valve: The pulmonic valve was normal in structure. Pulmonic valve regurgitation is  not visualized. No evidence of pulmonic stenosis. Aorta: The aortic root is normal in size and structure. Venous: The inferior vena cava is normal in size with greater than 50% respiratory variability, suggesting right atrial pressure of 3 mmHg. IAS/Shunts: No atrial level shunt detected by color flow Doppler.  LEFT VENTRICLE PLAX 2D LVIDd:         3.99 cm   Diastology LVIDs:         2.18 cm   LV e' medial:    8.27 cm/s LV PW:         1.28 cm   LV E/e' medial:  14.1 LV IVS:        1.22 cm   LV e' lateral:   10.20 cm/s LVOT diam:     1.90 cm   LV E/e' lateral: 11.5 LV SV:         67 LV SV Index:   39 LVOT Area:     2.84 cm  RIGHT VENTRICLE RV Basal diam:  3.22 cm RV S prime:     13.50 cm/s LEFT ATRIUM             Index        RIGHT ATRIUM          Index LA diam:  2.90 cm 1.69 cm/m   RA Area:     8.91 cm LA Vol (A2C):   42.5 ml 24.83 ml/m  RA Volume:   14.60 ml 8.53 ml/m LA Vol (A4C):   28.9 ml 16.88 ml/m LA Biplane Vol: 36.2 ml 21.15 ml/m  AORTIC VALVE                    PULMONIC VALVE AV Area (Vmax):    2.99 cm     PV Vmax:       1.02 m/s AV Area (Vmean):   2.77 cm     PV Vmean:      66.600 cm/s AV Area (VTI):     2.73 cm     PV VTI:        0.207 m AV Vmax:           108.00 cm/s  PV Peak grad:  4.2 mmHg AV Vmean:          75.500 cm/s  PV Mean grad:  2.0 mmHg AV VTI:            0.247 m AV Peak Grad:      4.7 mmHg AV Mean Grad:      3.0 mmHg LVOT Vmax:         114.00 cm/s LVOT Vmean:        73.800 cm/s LVOT VTI:          0.238 m LVOT/AV VTI ratio: 0.96  AORTA Ao Root diam: 3.20 cm MITRAL VALVE MV Area (PHT): 4.19 cm     SHUNTS MV Area VTI:   1.76 cm     Systemic VTI:  0.24 m MV Peak grad:  5.1 mmHg     Systemic Diam: 1.90 cm MV Mean grad:  2.0 mmHg MV Vmax:       1.13 m/s MV Vmean:      67.3 cm/s MV Decel Time: 181 msec MV E velocity: 117.00 cm/s MV A velocity: 115.00 cm/s MV E/A ratio:  1.02 Kathlyn Sacramento MD Electronically signed by Kathlyn Sacramento MD Signature Date/Time: 11/13/2021/1:08:34 PM     Final    MR BRAIN W WO CONTRAST  Result Date: 11/08/2021 CLINICAL DATA:  Non-small-cell lung cancer, dizziness, headaches EXAM: MRI HEAD WITHOUT AND WITH CONTRAST TECHNIQUE: Multiplanar, multiecho pulse sequences of the brain and surrounding structures were obtained without and with intravenous contrast. CONTRAST:  107mL GADAVIST GADOBUTROL 1 MMOL/ML IV SOLN COMPARISON:  Brain MRI 02/04/2019 FINDINGS: Brain: There is no acute intracranial hemorrhage, extra-axial fluid collection, or acute infarct. Parenchymal volume is normal. The ventricles are normal in size. Gray-white differentiation is preserved. There are scattered small foci of FLAIR signal abnormality in the subcortical and periventricular white matter, nonspecific but likely reflecting sequela of minimal chronic white matter microangiopathy, not substantially changed since 2020 There is no suspicious parenchymal signal abnormality. There is no mass lesion. There is no abnormal enhancement. There is no mass effect or midline shift. Vascular: Normal flow voids. Skull and upper cervical spine: Normal marrow signal. Sinuses/Orbits: There is mild mucosal thickening in the paranasal sinuses. The globes and orbits are unremarkable. Other: There are bilateral mastoid effusions, unchanged on the right but new on the left. The imaged nasopharynx is unremarkable. IMPRESSION: 1. No evidence of intracranial metastatic disease or other acute intracranial pathology. 2. Bilateral mastoid effusions, unchanged on the right but new on the left. Electronically Signed   By: Valetta Mole M.D.   On: 11/08/2021 12:54  Microbiology: Results for orders placed or performed during the hospital encounter of 11/30/21  SARS Coronavirus 2 by RT PCR (hospital order, performed in [facility] hospital lab) *cepheid single result test* Anterior Nasal Swab     Status: None   Collection Time: 11/30/21  9:52 PM   Specimen: Anterior Nasal Swab  Result Value Ref Range Status   SARS  Coronavirus 2 by RT PCR NEGATIVE NEGATIVE Final    Comment: (NOTE) SARS-CoV-2 target nucleic acids are NOT DETECTED.  The SARS-CoV-2 RNA is generally detectable in upper and lower respiratory specimens during the acute phase of infection. The lowest concentration of SARS-CoV-2 viral copies this assay can detect is 250 copies / mL. A negative result does not preclude SARS-CoV-2 infection and should not be used as the sole basis for treatment or other patient management decisions.  A negative result may occur with improper specimen collection / handling, submission of specimen other than nasopharyngeal swab, presence of viral mutation(s) within the areas targeted by this assay, and inadequate number of viral copies (<250 copies / mL). A negative result must be combined with clinical observations, patient history, and epidemiological information.  Fact Sheet for Patients:   https://www.patel.info/  Fact Sheet for Healthcare Providers: https://hall.com/  This test is not yet approved or  cleared by the [place] FDA and has been authorized for detection and/or diagnosis of SARS-CoV-2 by FDA under an Emergency Use Authorization (EUA).  This EUA will remain in effect (meaning this test can be used) for the duration of the COVID-19 declaration under Section 564(b)(1) of the Act, 21 U.S.C. section 360bbb-3(b)(1), unless the authorization is terminated or revoked sooner.  Performed at [facility], [address] [place], [place] [address]     Labs: CBC: Recent Labs  Lab 11/30/21 1320 12/01/21 0641 12/02/21 0614  WBC 6.9 5.8 5.9  HGB 12.9* 11.9* 11.0*  HCT 38.7* 36.5* 32.5*  MCV 81.1 83.1 82.1  PLT 202 167 353   Basic Metabolic Panel: Recent Labs  Lab 11/30/21 1320 12/01/21 0641 12/02/21 0614  NA 133* 138 137  K 4.8 4.2 3.8  CL 103 108 107  CO2 22 24 24   GLUCOSE 249* 141* 152*  BUN 18 18 10   CREATININE 1.11  0.98 0.62  CALCIUM 9.9 9.1 8.8*   Liver Function Tests: Recent Labs  Lab 11/30/21 1320 12/01/21 0641  AST 18 21  ALT 16 14  ALKPHOS 57 49  BILITOT 0.6 0.6  PROT 7.5 6.8  ALBUMIN 4.1 3.7   CBG: Recent Labs  Lab 11/30/21 2351 12/01/21 0455 12/01/21 2317  GLUCAP 126* 135* 152*    Discharge time spent: greater than 30 minutes.  Signed: Max Sane, MD [facility] 12/02/2021

## 2021-12-02 NOTE — Progress Notes (Deleted)
Pt transported to [facility] via bed by transporter.

## 2021-12-02 NOTE — Consult Note (Incomplete)
[place] CONSULT NOTE  Patient Care Team: [name], PA as PCP - General (Physician Assistant) Telford Nab, RN as Registered Nurse Clent Jacks, RN as Oncology Nurse Navigator  CHIEF COMPLAINTS/PURPOSE OF CONSULTATION: Lung cancer/colon cancer  HISTORY OF PRESENTING ILLNESS:  Kevin Mckay 71 y.o.  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  Patient is currently admitted to hospital for worsening abdominal pain nausea; prolonged weight loss.  Patient also noted to have bright red blood in stools.  Also complains of dizziness; and passing out spells.  Patient is CT scan of the abdomen pelvis-   Regards to history of lung cancer-patient is currently s/p chemotherapy radiation-currently   Review of Systems  Constitutional:  Positive for malaise/fatigue and weight loss. Negative for chills, diaphoresis and fever.  HENT:  Negative for nosebleeds and sore throat.   Eyes:  Negative for double vision.  Respiratory:  Negative for cough, hemoptysis, sputum production, shortness of breath and wheezing.   Cardiovascular:  Negative for chest pain, palpitations, orthopnea and leg swelling.  Gastrointestinal:  Positive for abdominal pain, blood in stool and nausea. Negative for constipation, diarrhea, heartburn, melena and vomiting.  Genitourinary:  Negative for dysuria, frequency and urgency.  Musculoskeletal:  Positive for back pain and joint pain.  Skin: Negative.  Negative for itching and rash.  Neurological:  Positive for dizziness. Negative for tingling, focal weakness, weakness and headaches.  Endo/Heme/Allergies:  Does not bruise/bleed easily.  Psychiatric/Behavioral:  Negative for depression. The patient is not nervous/anxious and does not have insomnia.     MEDICAL HISTORY:  Past Medical History:  Diagnosis Date  . Asthma   .  Cancer ([place])    Metastatic small cell lung cancer  . Cerebral aneurysm   . Chronic painful diabetic neuropathy ([place])   . Depression   . Diabetes mellitus without complication ([place])   . Essential hypertension   . History of kidney stones   . Occasional tremors   . Tobacco use     SURGICAL HISTORY: Past Surgical History:  Procedure Laterality Date  . ABDOMINAL SURGERY     age 32. trauma surgery due to forklift injury- liver and spleen  . COLONOSCOPY WITH PROPOFOL N/A 03/27/2019   Procedure: COLONOSCOPY WITH PROPOFOL;  Surgeon: Jonathon Bellows, MD;  Location: [facility] ENDOSCOPY;  Service: Gastroenterology;  Laterality: N/A;  . COLOSTOMY REVISION Right 05/04/2019   Procedure: COLON RESECTION RIGHT-right hemicolectomy-open;  Surgeon: Fredirick Maudlin, MD;  Location: [facility] ORS;  Service: General;  Laterality: Right;  . ESOPHAGOGASTRODUODENOSCOPY (EGD) WITH PROPOFOL N/A 03/27/2019   Procedure: ESOPHAGOGASTRODUODENOSCOPY (EGD) WITH PROPOFOL;  Surgeon: Jonathon Bellows, MD;  Location: [facility] ENDOSCOPY;  Service: Gastroenterology;  Laterality: N/A;  . IR GENERIC HISTORICAL  05/31/2016   IR ANGIO VERTEBRAL SEL VERTEBRAL BILAT MOD SED 05/31/2016 Consuella Lose, MD [facility]-INTERV RAD  . IR GENERIC HISTORICAL  05/31/2016   IR ANGIO INTRA EXTRACRAN SEL INTERNAL CAROTID BILAT MOD SED 05/31/2016 Consuella Lose, MD [facility]INTER[facility]  . PORTACATH PLACEMENT Right 02/04/2019   Procedure: INSERTION PORT-A-CATH;  Surgeon: [name], MD;  Location: [facility] ORS;  Service: General;  Laterality: Right;  . THORACOTOMY Left 01/22/2019   Procedure: PRE OP BRONCH LEFT ANTERIOR THORACOTOMY WITH BIOPSY OF MEDIASTINAL MASS;  Surgeon: [name], MD;  Location: [facility] ORS;  Service: General;  Laterality: Left;    SOCIAL HISTORY: Social History   Socioeconomic History  .  Marital status: Married    Spouse name: Not on file  . Number of children: Not on file  . Years of education: Not on file  . Highest education level: Not on file   Occupational History  . Not on file  Tobacco Use  . Smoking status: Every Day    Packs/day: 0.50    Years: 50.00    Total pack years: 25.00    Types: Cigarettes  . Smokeless tobacco: Never  Vaping Use  . Vaping Use: Never used  Substance and Sexual Activity  . Alcohol use: No  . Drug use: Yes    Types: Barbituates, Marijuana  . Sexual activity: Not Currently  Other Topics Concern  . Not on file  Social History Narrative   Lives in [place]; wife/ son/grandson [custody]; smoker; vending business; hx of alcoholism- quit at 76.    Social Determinants of Health   Financial Resource Strain: Low Risk  (05/04/2019)   Overall Financial Resource Strain (CARDIA)   . Difficulty of Paying Living Expenses: Not hard at all  Food Insecurity: No Food Insecurity (05/04/2019)   Hunger Vital Sign   . Worried About Charity fundraiser in the Last Year: Never true   . Ran Out of Food in the Last Year: Never true  Transportation Needs: No Transportation Needs (05/04/2019)   PRAPARE - Transportation   . Lack of Transportation (Medical): No   . Lack of Transportation (Non-Medical): No  Physical Activity: Inactive (05/04/2019)   Exercise Vital Sign   . Days of Exercise per Week: 0 days   . Minutes of Exercise per Session: 0 min  Stress: No Stress Concern Present (05/04/2019)   [place]   . Feeling of Stress : Not at all  Social Connections: Unknown (05/04/2019)   Social Connection and Isolation Panel [NHANES]   . Frequency of Communication with Friends and Family: Patient refused   . Frequency of Social Gatherings with Friends and Family: Patient refused   . Attends Religious Services: Patient refused   . Active Member of Clubs or Organizations: Patient refused   . Attends Archivist Meetings: Patient refused   . Marital Status: Patient refused  Intimate Partner Violence: Not At Risk (05/04/2019)   Humiliation,  Afraid, Rape, and Kick questionnaire   . Fear of Current or Ex-Partner: No   . Emotionally Abused: No   . Physically Abused: No   . Sexually Abused: No    FAMILY HISTORY: Family History  Problem Relation Age of Onset  . Lymphoma Father   . Liver cancer Paternal Uncle   . Lung cancer Paternal Uncle   . Lung cancer Maternal Uncle     ALLERGIES:  is allergic to ferumoxytol, amoxicillin, and codeine.  MEDICATIONS:  Current Facility-Administered Medications  Medication Dose Route Frequency Provider Last Rate Last Admin  . 0.9 %  sodium chloride infusion   Intravenous Continuous Para Skeans, MD 75 mL/hr at 12/01/21 0837 New Bag at 12/01/21 0837  . acetaminophen (TYLENOL) tablet 650 mg  650 mg Oral Q6H PRN Para Skeans, MD       Or  . acetaminophen (TYLENOL) suppository 650 mg  650 mg Rectal Q6H PRN Para Skeans, MD      . hydrALAZINE (APRESOLINE) injection 10 mg  10 mg Intravenous Q6H PRN Florina Ou V, MD      . morphine (PF) 2 MG/ML injection 2 mg  2 mg Intravenous Q3H PRN Posey Pronto,  Gretta Cool, MD   2 mg at 12/01/21 0143  . nicotine (NICODERM CQ - dosed in mg/24 hours) patch 21 mg  21 mg Transdermal Daily Para Skeans, MD   21 mg at 12/01/21 0830  . pantoprazole (PROTONIX) injection 40 mg  40 mg Intravenous Q12H Duffy Bruce, MD   40 mg at 12/01/21 0829  . sodium chloride flush (NS) 0.9 % injection 3 mL  3 mL Intravenous Q12H Para Skeans, MD       Facility-Administered Medications Ordered in Other Encounters  Medication Dose Route Frequency Provider Last Rate Last Admin  . heparin lock flush 100 UNIT/ML injection           . heparin lock flush 100 UNIT/ML injection           . sodium chloride flush (NS) 0.9 % injection 10 mL  10 mL Intravenous PRN Cammie Sickle, MD   10 mL at 09/13/20 0943  . sodium chloride flush (NS) 0.9 % injection 10 mL  10 mL Intravenous PRN Cammie Sickle, MD   10 mL at 07/13/21 0948    PHYSICAL EXAMINATION: ECOG PERFORMANCE STATUS: 1 -  Symptomatic but completely ambulatory  Vitals:   12/01/21 0800 12/01/21 1200  BP:  (!) 159/67  Pulse:  64  Resp: 16 18  Temp:  98 F (36.7 C)  SpO2:  97%   Filed Weights   11/30/21 2215 12/01/21 0448  Weight: 126 lb 8 oz (57.4 kg) 126 lb 15.8 oz (57.6 kg)    Physical Exam Vitals and nursing note reviewed.  HENT:     Head: Normocephalic and atraumatic.     Mouth/Throat:     Pharynx: Oropharynx is clear.  Eyes:     Extraocular Movements: Extraocular movements intact.     Pupils: Pupils are equal, round, and reactive to light.  Cardiovascular:     Rate and Rhythm: Normal rate and regular rhythm.  Pulmonary:     Comments: Decreased breath sounds bilaterally.  Abdominal:     Palpations: Abdomen is soft.  Musculoskeletal:        General: Normal range of motion.     Cervical back: Normal range of motion.  Skin:    General: Skin is warm.  Neurological:     General: No focal deficit present.     Mental Status: He is alert and oriented to person, place, and time.  Psychiatric:        Behavior: Behavior normal.        Judgment: Judgment normal.     LABORATORY DATA:  I have reviewed the data as listed Lab Results  Component Value Date   WBC 5.8 12/01/2021   HGB 11.9 (L) 12/01/2021   HCT 36.5 (L) 12/01/2021   MCV 83.1 12/01/2021   PLT 167 12/01/2021   Recent Labs    11/07/21 1106 11/30/21 1320 12/01/21 0641  NA 134* 133* 138  K 4.6 4.8 4.2  CL 99 103 108  CO2 24 22 24   GLUCOSE 231* 249* 141*  BUN 17 18 18   CREATININE 1.10 1.11 0.98  CALCIUM 9.2 9.9 9.1  GFRNONAA >60 >60 >60  PROT 7.3 7.5 6.8  ALBUMIN 3.8 4.1 3.7  AST 20 18 21   ALT 16 16 14   ALKPHOS 61 57 49  BILITOT 0.6 0.6 0.6    RADIOGRAPHIC STUDIES: I have personally reviewed the radiological images as listed and agreed with the findings in the report. CT ABDOMEN PELVIS W CONTRAST  Result  Date: 11/30/2021 CLINICAL DATA:  Abdominal pain, acute, nonlocalized. two episodes of dark tarry stools this  week. Previously treated for colon cancer. Also reports dizziness, and indigestion. Reports poor appetite for the last three days due to nausea and epigastric pain. EXAM: CT ABDOMEN AND PELVIS WITH CONTRAST TECHNIQUE: Multidetector CT imaging of the abdomen and pelvis was performed using the standard protocol following bolus administration of intravenous contrast. RADIATION DOSE REDUCTION: This exam was performed according to the departmental dose-optimization program which includes automated exposure control, adjustment of the mA and/or kV according to patient size and/or use of iterative reconstruction technique. CONTRAST:  171mL OMNIPAQUE IOHEXOL 300 MG/ML  SOLN COMPARISON:  CT abdomen pelvis 12/07/2020 FINDINGS: Lower chest: Similar-appearing likely distal esophageal wall lipomatous lesion (2:9). No acute abnormality. Hepatobiliary: Vague peripheral right hepatic lobe hypodensity (2:11) consistent with a biopsy-proven metastasis. No gallstones, gallbladder wall thickening, or pericholecystic fluid. No biliary dilatation. Pancreas: No focal lesion. Normal pancreatic contour. No surrounding inflammatory changes. No main pancreatic ductal dilatation. Spleen: Normal in size without focal abnormality. Adrenals/Urinary Tract: No adrenal nodule bilaterally. Bilateral kidneys enhance symmetrically. A 1.2 cm fluid density lesion within left kidney statistically likely represents a simple renal cyst. Simple renal cysts, in the absence of clinically indicated signs/symptoms, require no independent follow-up. No hydronephrosis. No hydroureter. The urinary bladder is unremarkable. On delayed imaging, there is no urothelial wall thickening and there are no filling defects in the opacified portions of the bilateral collecting systems or ureters. Stomach/Bowel: Partial colectomy involving the right colon. Stomach is within normal limits. No evidence of bowel wall thickening or dilatation. Scattered colonic diverticulosis.  Appendix appears normal. Vascular/Lymphatic: The main portal, splenic, superior mesenteric veins are patent. Perigastric and left upper quadrant venous collaterals. No abdominal aorta or iliac aneurysm. Severe atherosclerotic plaque of the aorta and its branches. No abdominal, pelvic, or inguinal lymphadenopathy. Reproductive: Prostate is unremarkable. Other: No intraperitoneal free fluid. No intraperitoneal free gas. No organized fluid collection. Musculoskeletal: No abdominal wall hernia or abnormality. No suspicious lytic or blastic osseous lesions. No acute displaced fracture. L5-S1 intervertebral disc space vacuum phenomenon and endplate sclerosis as well as posterior disc osteophyte complex formation, osteophyte formation, facet arthropathy. IMPRESSION: 1. Perigastric and left upper quadrant venous collaterals. Limited evaluation for an active gastrointestinal hemorrhage on this portal venous study. 2. Status post partial colectomy with treated right hepatic metastasis. 3. Aortic Atherosclerosis (ICD10-I70.0) - severe. Electronically Signed   By: Iven Finn M.D.   On: 11/30/2021 17:23   CT HEAD WO CONTRAST (5MM)  Result Date: 11/30/2021 CLINICAL DATA:  Head trauma, minor (Age >= 65y) EXAM: CT HEAD WITHOUT CONTRAST TECHNIQUE: Contiguous axial images were obtained from the base of the skull through the vertex without intravenous contrast. RADIATION DOSE REDUCTION: This exam was performed according to the departmental dose-optimization program which includes automated exposure control, adjustment of the mA and/or kV according to patient size and/or use of iterative reconstruction technique. COMPARISON:  MRI head 02/04/2019 FINDINGS: Brain: No evidence of large-territorial acute infarction. No parenchymal hemorrhage. No mass lesion. No extra-axial collection. No mass effect or midline shift. No hydrocephalus. Basilar cisterns are patent. Vascular: No hyperdense vessel. Atherosclerotic calcifications are  present within the cavernous internal carotid and vertebral arteries. Skull: No acute fracture or focal lesion. Sinuses/Orbits: Paranasal sinuses and mastoid air cells are clear. The orbits are unremarkable. Other: None. IMPRESSION: No acute intracranial abnormality. Electronically Signed   By: Iven Finn M.D.   On: 11/30/2021 17:12   ECHOCARDIOGRAM COMPLETE  Result Date: 11/13/2021    ECHOCARDIOGRAM REPORT   Patient Name:   ELIAZ FOUT Date of Exam: 11/13/2021 Medical Rec #:  [id number]       Height:       68.0 in Accession #:    [id number]      Weight:       131.8 lb Date of Birth:  Jul 26, 1950      BSA:          1.712 m Patient Age:    42 years        BP:           153/79 mmHg Patient Gender: M               HR:           66 bpm. Exam Location:  [facility] Procedure: 2D Echo, Cardiac Doppler and Color Doppler Indications:     R55 Syncope  History:         Patient has no prior history of Echocardiogram examinations.                  Asthma, Lung cancer, Colon cancer; Risk Factors:Current Smoker,                  Hypertension and Diabetes.  Sonographer:     Rosalia Hammers Referring Phys:  [id number] Cammie Sickle Diagnosing Phys: Kathlyn Sacramento MD  Sonographer Comments: Suboptimal parasternal window and suboptimal subcostal window. Image acquisition challenging due to patient body habitus and Image acquisition challenging due to respiratory motion. IMPRESSIONS  1. Left ventricular ejection fraction, by estimation, is 60 to 65%. The left ventricle has normal function. The left ventricle has no regional wall motion abnormalities. There is mild left ventricular hypertrophy. Left ventricular diastolic parameters were normal.  2. Right ventricular systolic function is normal. The right ventricular size is normal. Tricuspid regurgitation signal is inadequate for assessing PA pressure.  3. The mitral valve is normal in structure. No evidence of mitral valve regurgitation. No evidence of mitral stenosis.  4. The  aortic valve is normal in structure. Aortic valve regurgitation is not visualized. Aortic valve sclerosis/calcification is present, without any evidence of aortic stenosis.  5. The inferior vena cava is normal in size with greater than 50% respiratory variability, suggesting right atrial pressure of 3 mmHg. FINDINGS  Left Ventricle: Left ventricular ejection fraction, by estimation, is 60 to 65%. The left ventricle has normal function. The left ventricle has no regional wall motion abnormalities. The left ventricular internal cavity size was normal in size. There is  mild left ventricular hypertrophy. Left ventricular diastolic parameters were normal. Right Ventricle: The right ventricular size is normal. No increase in right ventricular wall thickness. Right ventricular systolic function is normal. Tricuspid regurgitation signal is inadequate for assessing PA pressure. Left Atrium: Left atrial size was normal in size. Right Atrium: Right atrial size was normal in size. Pericardium: There is no evidence of pericardial effusion. Mitral Valve: The mitral valve is normal in structure. No evidence of mitral valve regurgitation. No evidence of mitral valve stenosis. MV peak gradient, 5.1 mmHg. The mean mitral valve gradient is 2.0 mmHg. Tricuspid Valve: The tricuspid valve is normal in structure. Tricuspid valve regurgitation is not demonstrated. No evidence of tricuspid stenosis. Aortic Valve: The aortic valve is normal in structure. Aortic valve regurgitation is not visualized. Aortic valve sclerosis/calcification is present, without any evidence of aortic stenosis. Aortic valve mean gradient measures 3.0 mmHg. Aortic valve peak  gradient  measures 4.7 mmHg. Aortic valve area, by VTI measures 2.73 cm. Pulmonic Valve: The pulmonic valve was normal in structure. Pulmonic valve regurgitation is not visualized. No evidence of pulmonic stenosis. Aorta: The aortic root is normal in size and structure. Venous: The inferior  vena cava is normal in size with greater than 50% respiratory variability, suggesting right atrial pressure of 3 mmHg. IAS/Shunts: No atrial level shunt detected by color flow Doppler.  LEFT VENTRICLE PLAX 2D LVIDd:         3.99 cm   Diastology LVIDs:         2.18 cm   LV e' medial:    8.27 cm/s LV PW:         1.28 cm   LV E/e' medial:  14.1 LV IVS:        1.22 cm   LV e' lateral:   10.20 cm/s LVOT diam:     1.90 cm   LV E/e' lateral: 11.5 LV SV:         67 LV SV Index:   39 LVOT Area:     2.84 cm  RIGHT VENTRICLE RV Basal diam:  3.22 cm RV S prime:     13.50 cm/s LEFT ATRIUM             Index        RIGHT ATRIUM          Index LA diam:        2.90 cm 1.69 cm/m   RA Area:     8.91 cm LA Vol (A2C):   42.5 ml 24.83 ml/m  RA Volume:   14.60 ml 8.53 ml/m LA Vol (A4C):   28.9 ml 16.88 ml/m LA Biplane Vol: 36.2 ml 21.15 ml/m  AORTIC VALVE                    PULMONIC VALVE AV Area (Vmax):    2.99 cm     PV Vmax:       1.02 m/s AV Area (Vmean):   2.77 cm     PV Vmean:      66.600 cm/s AV Area (VTI):     2.73 cm     PV VTI:        0.207 m AV Vmax:           108.00 cm/s  PV Peak grad:  4.2 mmHg AV Vmean:          75.500 cm/s  PV Mean grad:  2.0 mmHg AV VTI:            0.247 m AV Peak Grad:      4.7 mmHg AV Mean Grad:      3.0 mmHg LVOT Vmax:         114.00 cm/s LVOT Vmean:        73.800 cm/s LVOT VTI:          0.238 m LVOT/AV VTI ratio: 0.96  AORTA Ao Root diam: 3.20 cm MITRAL VALVE MV Area (PHT): 4.19 cm     SHUNTS MV Area VTI:   1.76 cm     Systemic VTI:  0.24 m MV Peak grad:  5.1 mmHg     Systemic Diam: 1.90 cm MV Mean grad:  2.0 mmHg MV Vmax:       1.13 m/s MV Vmean:      67.3 cm/s MV Decel Time: 181 msec MV E velocity: 117.00 cm/s MV A velocity: 115.00 cm/s MV E/A ratio:  1.02 Kathlyn Sacramento MD Electronically signed by Kathlyn Sacramento MD Signature Date/Time: 11/13/2021/1:08:34 PM    Final    MR BRAIN W WO CONTRAST  Result Date: 11/08/2021 CLINICAL DATA:  Non-small-cell lung cancer, dizziness, headaches  EXAM: MRI HEAD WITHOUT AND WITH CONTRAST TECHNIQUE: Multiplanar, multiecho pulse sequences of the brain and surrounding structures were obtained without and with intravenous contrast. CONTRAST:  78mL GADAVIST GADOBUTROL 1 MMOL/ML IV SOLN COMPARISON:  Brain MRI 02/04/2019 FINDINGS: Brain: There is no acute intracranial hemorrhage, extra-axial fluid collection, or acute infarct. Parenchymal volume is normal. The ventricles are normal in size. Gray-white differentiation is preserved. There are scattered small foci of FLAIR signal abnormality in the subcortical and periventricular white matter, nonspecific but likely reflecting sequela of minimal chronic white matter microangiopathy, not substantially changed since 2020 There is no suspicious parenchymal signal abnormality. There is no mass lesion. There is no abnormal enhancement. There is no mass effect or midline shift. Vascular: Normal flow voids. Skull and upper cervical spine: Normal marrow signal. Sinuses/Orbits: There is mild mucosal thickening in the paranasal sinuses. The globes and orbits are unremarkable. Other: There are bilateral mastoid effusions, unchanged on the right but new on the left. The imaged nasopharynx is unremarkable. IMPRESSION: 1. No evidence of intracranial metastatic disease or other acute intracranial pathology. 2. Bilateral mastoid effusions, unchanged on the right but new on the left. Electronically Signed   By: Valetta Mole M.D.   On: 11/08/2021 12:54     #   Abdominal pain Pt is at [place] for abdominal / mesenteric ischemia. I suspect he has been having symptoms of abdominal pain due to mesenteric insufficiency  Due atherosclerosis and also BP meds, In his case we will make SBP goal of 150-160 to avoid intermittent hypoperfusion.  Risk factors for him are his ongoing tobacco abuse and his absent pedal pulses which make this very suspicious. Pt does not have an acute abdomen and lactic is currently negative but I suspect he is  having mesenteric insufficieny / intermittent ischemia due to intermittent low flow. Have d/w Gi about angio study for CTA/ MRA of abdomen for eval of circulation although CT notes that vessels are patent he is at high risk for embolic ischemia. Currently as pt is stable we will repeat lactic and have high index of suspicion and monitoring for ischemia. Greatly appecaite GI consult management and care. Hydrate pt and keep SBP 130-140's.  Hold all po meds.  Avoid NSAID'S . Iv ppi.  PRN pain meds.  Vascular consult per AM Team.  CTA/MRA abdomen when 24 hours are past as pt has already had ct with contrast today.       Essential hypertension PRN hydralazine.    Type 2 diabetes mellitus with diabetic neuropathy (HCC) Pt's a1c more than 2 year ago was 9.5 Due to him being underweight we will change to Januvia or glipizide or actos / or insulin. D/c metformin so weight loss is avoided further. Currently SSI. NPO.      Tobacco use counseled about danger of ongoing smoking.  nicotine patch.   PAD (peripheral artery disease) (HCC) Last ABI in 2022 shows: IMPRESSION: 1. Suggestion of bilateral inflow disease including prominent atherosclerotic plaques throughout the bilateral common femoral arteries, likely flow limiting in the left. 2. Multifocal atherosclerotic plaques throughout the bilateral outflow vessels with suggestion of flow limiting stenosis in the bilateral mid superficial femoral arteries. 3. Monophasic bilateral runoff vessels.  Ruthann Cancer, MD Pt needs angio with runoff and  vascular consult  as he only has doppler pulses.  Plavix currently held for possible GI procedure in am.   Rectal bleeding We will follow cbc , transfuse as needed.  Appreciate GI consult. Iv ppi. Npo for Upper /lower gi eval for cancer .    Above plan of care was discussed with patient/family in detail.  My contact information was given to the patient/family.       Cammie Sickle, MD 12/01/2021 12:32 PM

## 2021-12-02 NOTE — Op Note (Signed)
[facility] Gastroenterology Patient Name: Kevin Mckay Procedure Date: 12/02/2021 6:57 AM MRN: [id number] Account #: [id number] Date of Birth: [birth year] Admit Type: Inpatient Age: 71 Room: [facility] ENDO ROOM 4 Gender: Male Note Status: Finalized Instrument Name: Jasper Riling [id number] Procedure:             Colonoscopy Indications:           Rectal bleeding Providers:             Andrey Farmer MD, MD Medicines:             Monitored Anesthesia Care Complications:         No immediate complications. Procedure:             Pre-Anesthesia Assessment:                        - Prior to the procedure, a History and Physical was                         performed, and patient medications and allergies were                         reviewed. The patient is competent. The risks and                         benefits of the procedure and the sedation options and                         risks were discussed with the patient. All questions                         were answered and informed consent was obtained.                         Patient identification and proposed procedure were                         verified by the physician, the nurse, the                         anesthesiologist, the anesthetist and the technician                         in the endoscopy suite. Mental Status Examination:                         alert and oriented. Airway Examination: normal                         oropharyngeal airway and neck mobility. Respiratory                         Examination: clear to auscultation. CV Examination:                         normal. Prophylactic Antibiotics: The patient does not                         require prophylactic antibiotics. Prior  Anticoagulants: The patient has taken Plavix                         (clopidogrel), last dose was 3 days prior to                         procedure. ASA Grade Assessment: III - A patient with                          severe systemic disease. After reviewing the risks and                         benefits, the patient was deemed in satisfactory                         condition to undergo the procedure. The anesthesia                         plan was to use monitored anesthesia care (MAC).                         Immediately prior to administration of medications,                         the patient was re-assessed for adequacy to receive                         sedatives. The heart rate, respiratory rate, oxygen                         saturations, blood pressure, adequacy of pulmonary                         ventilation, and response to care were monitored                         throughout the procedure. The physical status of the                         patient was re-assessed after the procedure.                        After obtaining informed consent, the colonoscope was                         passed under direct vision. Throughout the procedure,                         the patient's blood pressure, pulse, and oxygen                         saturations were monitored continuously. The                         Colonoscope was introduced through the anus and                         advanced to the the ileocolonic anastomosis. The  colonoscopy was performed without difficulty. The                         patient tolerated the procedure well. The quality of                         the bowel preparation was fair. Findings:      The perianal and digital rectal examinations were normal.      There was evidence of a prior end-to-side ileo-colonic anastomosis in       the ascending colon. This was patent and was characterized by healthy       appearing mucosa.      A 7 mm polyp was found in the hepatic flexure. The polyp was sessile.       Polypectomy was not attempted due to the patient taking anticoagulation       medication.      A few sessile polyps were found in  the sigmoid colon, descending colon       and transverse colon. The polyps were small in size. Polypectomy was not       attempted due to the patient taking anticoagulation medication.      A 10 mm polyp was found in the rectum. The polyp was pedunculated.       Polypectomy was not attempted due to the patient taking anticoagulation       medication.      Multiple small and large-mouthed diverticula were found in the sigmoid       colon, descending colon and transverse colon.      Internal hemorrhoids were found during retroflexion. The hemorrhoids       were Grade I (internal hemorrhoids that do not prolapse).      The exam was otherwise without abnormality on direct and retroflexion       views. Impression:            - Preparation of the colon was fair.                        - Patent end-to-side ileo-colonic anastomosis,                         characterized by healthy appearing mucosa.                        - One 7 mm polyp at the hepatic flexure. Resection not                         attempted.                        - A few small polyps in the sigmoid colon, in the                         descending colon and in the transverse colon.                         Resection not attempted.                        - One 10 mm polyp in the rectum. Resection not  attempted.                        - Diverticulosis in the sigmoid colon, in the                         descending colon and in the transverse colon.                        - Internal hemorrhoids.                        - The examination was otherwise normal on direct and                         retroflexion views.                        - No specimens collected. Recommendation:        - Return patient to hospital ward for ongoing care.                        - Advance diet as tolerated.                        - Continue present medications.                        - Repeat colonoscopy at appointment to be  scheduled.                        - Return to referring physician as previously                         scheduled. Procedure Code(s):     --- Professional ---                        [phone], Colonoscopy, flexible; diagnostic, including                         collection of specimen(s) by brushing or washing, when                         performed (separate procedure) Diagnosis Code(s):     --- Professional ---                        K64.0, First degree hemorrhoids                        Z98.0, Intestinal bypass and anastomosis status                        K63.5, Polyp of colon                        K62.1, Rectal polyp                        K62.5, Hemorrhage of anus and rectum                        K57.30,  Diverticulosis of large intestine without                         perforation or abscess without bleeding CPT copyright 2019 [employer]. All rights reserved. The codes documented in this report are preliminary and upon coder review may  be revised to meet current compliance requirements. Andrey Farmer MD, MD 12/02/2021 10:33:51 AM Number of Addenda: 0 Note Initiated On: 12/02/2021 6:57 AM Scope Withdrawal Time: 0 hours 6 minutes 4 seconds  Total Procedure Duration: 0 hours 12 minutes 46 seconds  Estimated Blood Loss:  Estimated blood loss: none.      [facility]

## 2021-12-02 NOTE — Op Note (Signed)
[facility] Gastroenterology Patient Name: Kevin Mckay Procedure Date: 12/02/2021 7:07 AM MRN: [id number] Account #: [id number] Date of Birth: July 27, 1950 Admit Type: Inpatient Age: 71 Room: [facility] ENDO ROOM 4 Gender: Male Note Status: Finalized Instrument Name: Altamese Cabal Endoscope [id number],VHQIO Endoscope [id number] Procedure:             Upper GI endoscopy Indications:           Melena Providers:             Andrey Farmer MD, MD Medicines:             Monitored Anesthesia Care Complications:         No immediate complications. Procedure:             Pre-Anesthesia Assessment:                        - Prior to the procedure, a History and Physical was                         performed, and patient medications and allergies were                         reviewed. The patient is competent. The risks and                         benefits of the procedure and the sedation options and                         risks were discussed with the patient. All questions                         were answered and informed consent was obtained.                         Patient identification and proposed procedure were                         verified by the physician, the nurse, the                         anesthesiologist, the anesthetist and the technician                         in the endoscopy suite. Mental Status Examination:                         alert and oriented. Airway Examination: normal                         oropharyngeal airway and neck mobility. Respiratory                         Examination: clear to auscultation. CV Examination:                         normal. Prophylactic Antibiotics: The patient does not                         require prophylactic antibiotics. Prior  Anticoagulants: The patient has taken no previous                         anticoagulant or antiplatelet agents. ASA Grade                         Assessment: III - A patient  with severe systemic                         disease. After reviewing the risks and benefits, the                         patient was deemed in satisfactory condition to                         undergo the procedure. The anesthesia plan was to use                         monitored anesthesia care (MAC). Immediately prior to                         administration of medications, the patient was                         re-assessed for adequacy to receive sedatives. The                         heart rate, respiratory rate, oxygen saturations,                         blood pressure, adequacy of pulmonary ventilation, and                         response to care were monitored throughout the                         procedure. The physical status of the patient was                         re-assessed after the procedure.                        After obtaining informed consent, the endoscope was                         passed under direct vision. Throughout the procedure,                         the patient's blood pressure, pulse, and oxygen                         saturations were monitored continuously. The Endoscope                         was introduced through the mouth, and advanced to the                         second part of duodenum. The upper GI endoscopy was  accomplished without difficulty. The Endoscope was                         introduced through the mouth, and advanced to the                         second part of duodenum. The patient tolerated the                         procedure well. Findings:      A non-obstructing Schatzki ring was found in the lower third of the       esophagus.      The exam of the esophagus was otherwise normal.      A single 15 mm submucosal papule (nodule) with no bleeding and no       stigmata of recent bleeding was found in the gastric fundus.      The exam of the stomach was otherwise normal.      The examined duodenum  was normal. Impression:            - Non-obstructing Schatzki ring.                        - A single submucosal papule (nodule) found in the                         stomach. Consider outpatient EUS.                        - Normal examined duodenum.                        - No specimens collected. Recommendation:        - Perform a colonoscopy today. Procedure Code(s):     --- Professional ---                        [phone], Esophagogastroduodenoscopy, flexible,                         transoral; diagnostic, including collection of                         specimen(s) by brushing or washing, when performed                         (separate procedure) Diagnosis Code(s):     --- Professional ---                        K22.2, Esophageal obstruction                        K31.89, Other diseases of stomach and duodenum                        K92.1, Melena (includes Hematochezia) CPT copyright 2019 [employer] Association. All rights reserved. The codes documented in this report are preliminary and upon coder review may  be revised to meet current compliance requirements. Andrey Farmer MD, MD 12/02/2021 10:27:10 AM Number of Addenda: 0 Note Initiated On: 12/02/2021 7:07 AM Estimated Blood Loss:  Estimated blood  loss: none.      [facility]

## 2021-12-02 NOTE — Progress Notes (Signed)
Discharge instructions (including medications) discussed with and copy provided to patient/caregiver. All belongings sent with patient. 

## 2021-12-02 NOTE — Anesthesia Postprocedure Evaluation (Signed)
Anesthesia Post Note  Patient: Kevin Mckay  Procedure(s) Performed: COLONOSCOPY WITH PROPOFOL ESOPHAGOGASTRODUODENOSCOPY (EGD)  Patient location during evaluation: PACU Anesthesia Type: General Level of consciousness: awake and alert Pain management: pain level controlled Vital Signs Assessment: post-procedure vital signs reviewed and stable Respiratory status: spontaneous breathing, nonlabored ventilation and respiratory function stable Cardiovascular status: blood pressure returned to baseline and stable Postop Assessment: no apparent nausea or vomiting Anesthetic complications: no   No notable events documented.   Last Vitals:  Vitals:   12/02/21 1033 12/02/21 1448  BP: 130/72 (!) 149/77  Pulse: 79 77  Resp: 16 20  Temp:  36.6 C  SpO2: 97% 94%    Last Pain:  Vitals:   12/02/21 1448  TempSrc: Oral  PainSc:                  [place] Ouch

## 2021-12-02 NOTE — Transfer of Care (Signed)
Immediate Anesthesia Transfer of Care Note  Patient: Kevin Mckay  Procedure(s) Performed: COLONOSCOPY WITH PROPOFOL ESOPHAGOGASTRODUODENOSCOPY (EGD)  Patient Location: PACU  Anesthesia Type:General  Level of Consciousness: awake, alert  and oriented  Airway & Oxygen Therapy: Patient Spontanous Breathing  Post-op Assessment: Report given to RN and Post -op Vital signs reviewed and stable  Post vital signs: Reviewed and stable  Last Vitals:  Vitals Value Taken Time  BP 137/67 12/02/21 1017  Temp 36.8 C 12/02/21 1017  Pulse 81 12/02/21 1017  Resp 12 12/02/21 1017  SpO2 97 % 12/02/21 1017    Last Pain:  Vitals:   12/02/21 0900  TempSrc:   PainSc: 3       Patients Stated Pain Goal: 0 (34/35/68 [id number])  Complications: No notable events documented.

## 2021-12-03 LAB — HEMOGLOBIN A1C
Hgb A1c MFr Bld: 8.8 % — ABNORMAL HIGH (ref 4.8–5.6)
Mean Plasma Glucose: 205.86 mg/dL

## 2021-12-04 ENCOUNTER — Encounter: Payer: Self-pay | Admitting: Gastroenterology

## 2021-12-05 ENCOUNTER — Encounter: Payer: Self-pay | Admitting: Internal Medicine

## 2021-12-05 ENCOUNTER — Inpatient Hospital Stay: Payer: BC Managed Care – PPO

## 2021-12-05 ENCOUNTER — Inpatient Hospital Stay: Payer: BC Managed Care – PPO | Attending: Internal Medicine | Admitting: Internal Medicine

## 2021-12-05 VITALS — BP 80/40 | HR 70 | Temp 98.3°F | Ht 68.0 in | Wt 136.0 lb

## 2021-12-05 VITALS — BP 141/65 | HR 65 | Resp 17

## 2021-12-05 DIAGNOSIS — R5383 Other fatigue: Secondary | ICD-10-CM | POA: Diagnosis not present

## 2021-12-05 DIAGNOSIS — C184 Malignant neoplasm of transverse colon: Secondary | ICD-10-CM | POA: Diagnosis not present

## 2021-12-05 DIAGNOSIS — C787 Secondary malignant neoplasm of liver and intrahepatic bile duct: Secondary | ICD-10-CM | POA: Diagnosis present

## 2021-12-05 DIAGNOSIS — C781 Secondary malignant neoplasm of mediastinum: Secondary | ICD-10-CM | POA: Diagnosis not present

## 2021-12-05 DIAGNOSIS — Z794 Long term (current) use of insulin: Secondary | ICD-10-CM | POA: Insufficient documentation

## 2021-12-05 DIAGNOSIS — F17210 Nicotine dependence, cigarettes, uncomplicated: Secondary | ICD-10-CM | POA: Insufficient documentation

## 2021-12-05 DIAGNOSIS — D61810 Antineoplastic chemotherapy induced pancytopenia: Secondary | ICD-10-CM | POA: Diagnosis not present

## 2021-12-05 DIAGNOSIS — Z7984 Long term (current) use of oral hypoglycemic drugs: Secondary | ICD-10-CM | POA: Insufficient documentation

## 2021-12-05 DIAGNOSIS — R55 Syncope and collapse: Secondary | ICD-10-CM

## 2021-12-05 DIAGNOSIS — M2550 Pain in unspecified joint: Secondary | ICD-10-CM | POA: Diagnosis not present

## 2021-12-05 DIAGNOSIS — I10 Essential (primary) hypertension: Secondary | ICD-10-CM | POA: Diagnosis not present

## 2021-12-05 DIAGNOSIS — M48061 Spinal stenosis, lumbar region without neurogenic claudication: Secondary | ICD-10-CM | POA: Diagnosis not present

## 2021-12-05 DIAGNOSIS — E871 Hypo-osmolality and hyponatremia: Secondary | ICD-10-CM | POA: Diagnosis not present

## 2021-12-05 DIAGNOSIS — C801 Malignant (primary) neoplasm, unspecified: Secondary | ICD-10-CM | POA: Diagnosis not present

## 2021-12-05 DIAGNOSIS — I959 Hypotension, unspecified: Secondary | ICD-10-CM | POA: Insufficient documentation

## 2021-12-05 DIAGNOSIS — R634 Abnormal weight loss: Secondary | ICD-10-CM | POA: Diagnosis not present

## 2021-12-05 DIAGNOSIS — E860 Dehydration: Secondary | ICD-10-CM | POA: Diagnosis not present

## 2021-12-05 DIAGNOSIS — E1151 Type 2 diabetes mellitus with diabetic peripheral angiopathy without gangrene: Secondary | ICD-10-CM | POA: Insufficient documentation

## 2021-12-05 DIAGNOSIS — G893 Neoplasm related pain (acute) (chronic): Secondary | ICD-10-CM | POA: Diagnosis not present

## 2021-12-05 DIAGNOSIS — K8689 Other specified diseases of pancreas: Secondary | ICD-10-CM

## 2021-12-05 DIAGNOSIS — Z7902 Long term (current) use of antithrombotics/antiplatelets: Secondary | ICD-10-CM | POA: Insufficient documentation

## 2021-12-05 DIAGNOSIS — E1142 Type 2 diabetes mellitus with diabetic polyneuropathy: Secondary | ICD-10-CM | POA: Insufficient documentation

## 2021-12-05 DIAGNOSIS — R42 Dizziness and giddiness: Secondary | ICD-10-CM | POA: Insufficient documentation

## 2021-12-05 DIAGNOSIS — Z79899 Other long term (current) drug therapy: Secondary | ICD-10-CM | POA: Diagnosis not present

## 2021-12-05 DIAGNOSIS — C3490 Malignant neoplasm of unspecified part of unspecified bronchus or lung: Secondary | ICD-10-CM | POA: Insufficient documentation

## 2021-12-05 DIAGNOSIS — C182 Malignant neoplasm of ascending colon: Secondary | ICD-10-CM

## 2021-12-05 DIAGNOSIS — M549 Dorsalgia, unspecified: Secondary | ICD-10-CM | POA: Diagnosis not present

## 2021-12-05 MED ORDER — SODIUM CHLORIDE 0.9% FLUSH
10.0000 mL | Freq: Once | INTRAVENOUS | Status: AC
  Administered 2021-12-05: 10 mL via INTRAVENOUS
  Filled 2021-12-05: qty 10

## 2021-12-05 MED ORDER — SODIUM CHLORIDE 0.9 % IV SOLN
Freq: Once | INTRAVENOUS | Status: AC
  Filled 2021-12-05: qty 250

## 2021-12-05 MED ORDER — HEPARIN SOD (PORK) LOCK FLUSH 100 UNIT/ML IV SOLN
500.0000 [IU] | Freq: Once | INTRAVENOUS | Status: AC
  Administered 2021-12-05: 500 [IU] via INTRAVENOUS
  Filled 2021-12-05: qty 5

## 2021-12-05 NOTE — Progress Notes (Signed)
[facility] CONSULT NOTE  Patient Care Team: [name], PA as PCP - General (Physician Assistant) Telford Nab, RN as Registered Nurse Clent Jacks, RN as Oncology Nurse Navigator Cammie Sickle, MD as Consulting Physician (Oncology)  CHIEF COMPLAINTS/PURPOSE OF CONSULTATION: Lung cancer/colon cancer   Oncology History Overview Note  # July 2020- SMALL CELL CA METASTATIC TO MEDIASTINAL LN [open Bx; Dr.Oaks]; TxN2M0; July 2nd 2020-PET- 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  MRI brain negative  # aug 17th 2020-carbo etoposide-RT [? 8/19]; #4 cycle carbo-Etop [finished dec 30th,2020]  #August 2020 iron deficient anemia-question etiology; IV Feraheme [no colonoscopy]  # OCT 2020- colo/ Cecal adeno ca; MRI liver- 1 cm enhancing lesion "metastasis" [Dr.Anna/Dr.Cannon]-difficulty biopsy.  November 02-2019 right hemicolectomy- STAGE III [pT3pN1 (2/13LN)]- HOLD adjuvant therapy. MMR- INTACT/LOW  #May 2021-liver biopsy-possible adenocarcinoma; colorectal origin.  Stage IV colon cancer  #May 24-2021-FOLFOX with Avastin; AUG 31st,2021- STABLE liver lesion; SEP 1st, 2021- Discontinue LV+Ox sec to severe fatigue/PN; cont 5FU CIV + Avastin  # COPD/  DM-2- on OHA/ smoker/ PVD/peripheral neuropathy  # History of alcohol abuse/quit 2014/ Hx of abdominal trauma [at 21y]  #July 2021-foundation 1 NGS [liver metastases]- RAS WILD TYPE **  # Jan 1st, 2022-Covid infection [prior vaccination; monoclonal infusion-not hospitalized]  # DIAGNOSIS:   # SMALL CELL CA-limited stage-status post chemoradiation;   # colon cancer-stage IV-solitary liver lesion  GOALS: Control  CURRENT/MOST RECENT THERAPY : FOLFOX with Avastin   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 ([place])  01/29/2019 Initial Diagnosis   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 [facility])   02/09/2019 - 06/24/2019  Chemotherapy   The patient had palonosetron (ALOXI) injection 0.25 mg, 0.25 mg, Intravenous,  Once, 4 of 4 cycles Administration: 0.25 mg (02/09/2019), 0.25 mg (03/03/2019), 0.25 mg (06/02/2019), 0.25 mg (06/22/2019) CARBOplatin (PARAPLATIN) 410 mg in sodium chloride 0.9 % 250 mL chemo infusion, 410 mg (100 % of original dose 414 mg), Intravenous,  Once, 4 of 4 cycles Dose modification:   (original dose 414 mg, Cycle 1) Administration: 410 mg (02/09/2019), 410 mg (03/03/2019), 410 mg (06/02/2019), 410 mg (06/22/2019) etoposide (VEPESID) 180 mg in sodium chloride 0.9 % 500 mL chemo infusion, 100 mg/m2 = 180 mg, Intravenous,  Once, 4 of 4 cycles Administration: 180 mg (02/09/2019), 180 mg (02/10/2019), 180 mg (02/11/2019), 180 mg (03/03/2019), 180 mg (03/04/2019), 180 mg (03/05/2019), 180 mg (06/02/2019), 180 mg (06/03/2019), 180 mg (06/04/2019), 180 mg (06/22/2019), 180 mg (06/23/2019), 180 mg (06/24/2019) fosaprepitant (EMEND) 150 mg, dexamethasone (DECADRON) 6 mg in sodium chloride 0.9 % 145 mL IVPB, , Intravenous,  Once, 2 of 2 cycles Administration:  (06/02/2019),  (06/22/2019)  for chemotherapy treatment.    Cancer of right colon ([place])  05/04/2019 Initial Diagnosis   Cancer of ascending colon ([place])   11/30/2019 -  Chemotherapy    Patient is on Treatment Plan: COLORECTAL FOLFOX + BEVACIZUMAB Q14D       HISTORY OF PRESENTING ILLNESS: Accompanied by his wife.  Ambulating independently.  Kevin Mckay 71 y.o.  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  In the interim patient was admitted to the hospital for abdominal pain rectal bleeding status post EGD/colonoscopy.  He also had carotid Dopplers done-given pre-syncopal episodes...  Patient complains of overall feeling poorly.  ....  He also complains of random episodes of  lightheadedness/presyncopal episodes that he started to the ground.  2-3 a day; however more recently up to 5 a day.  No trauma.  No seizure-like activity.  No complete loss of consciousness.  Not positional.  Also complains of headaches.  Complains of weight loss.   Review of Systems  Constitutional:  Positive for malaise/fatigue and weight loss. Negative for chills, diaphoresis and fever.  HENT:  Negative for nosebleeds and sore throat.   Eyes:  Negative for double vision.  Respiratory:  Negative for hemoptysis and wheezing.   Cardiovascular:  Negative for chest pain, palpitations and orthopnea.  Gastrointestinal:  Negative for abdominal pain, blood in stool, constipation, diarrhea, heartburn, melena, nausea and vomiting.  Genitourinary:  Negative for dysuria, frequency and urgency.  Musculoskeletal:  Positive for back pain and joint pain.  Skin: Negative.  Negative for itching and rash.  Neurological:  Positive for tingling. Negative for dizziness, focal weakness and weakness.  Endo/Heme/Allergies:  Does not bruise/bleed easily.  Psychiatric/Behavioral:  Negative for depression. The patient is not nervous/anxious and does not have insomnia.      MEDICAL HISTORY:  Past Medical History:  Diagnosis Date  . Asthma   . Cancer ([place])    Metastatic small cell lung cancer  . Cerebral aneurysm   . Chronic painful diabetic neuropathy ([place])   . Depression   . Diabetes mellitus without complication ([place])   . Essential hypertension   . History of kidney stones   . Occasional tremors   . Tobacco use     SURGICAL HISTORY: Past Surgical History:  Procedure Laterality Date  . ABDOMINAL SURGERY     age 36. trauma surgery due to forklift injury- liver and spleen  . COLONOSCOPY WITH PROPOFOL N/A 03/27/2019   Procedure: COLONOSCOPY WITH PROPOFOL;  Surgeon: Jonathon Bellows, MD;  Location: [facility] ENDOSCOPY;  Service: Gastroenterology;  Laterality: N/A;  . COLONOSCOPY WITH PROPOFOL N/A 12/02/2021    Procedure: COLONOSCOPY WITH PROPOFOL;  Surgeon: Lesly Rubenstein, MD;  Location: [facility] ENDOSCOPY;  Service: Endoscopy;  Laterality: N/A;  . COLOSTOMY REVISION Right 05/04/2019   Procedure: COLON RESECTION RIGHT-right hemicolectomy-open;  Surgeon: Fredirick Maudlin, MD;  Location: [facility] ORS;  Service: General;  Laterality: Right;  . ESOPHAGOGASTRODUODENOSCOPY N/A 12/02/2021   Procedure: ESOPHAGOGASTRODUODENOSCOPY (EGD);  Surgeon: Lesly Rubenstein, MD;  Location: [facility] ENDOSCOPY;  Service: Endoscopy;  Laterality: N/A;  . ESOPHAGOGASTRODUODENOSCOPY (EGD) WITH PROPOFOL N/A 03/27/2019   Procedure: ESOPHAGOGASTRODUODENOSCOPY (EGD) WITH PROPOFOL;  Surgeon: Jonathon Bellows, MD;  Location: [facility] ENDOSCOPY;  Service: Gastroenterology;  Laterality: N/A;  . IR GENERIC HISTORICAL  05/31/2016   IR ANGIO VERTEBRAL SEL VERTEBRAL BILAT MOD SED 05/31/2016 Consuella Lose, MD [facility]-INTERV RAD  . IR GENERIC HISTORICAL  05/31/2016   IR ANGIO INTRA EXTRACRAN SEL INTERNAL CAROTID BILAT MOD SED 05/31/2016 Consuella Lose, MD [facility]INTER[facility] RAD  . PORTACATH PLACEMENT Right 02/04/2019   Procedure: INSERTION PORT-A-CATH;  Surgeon: [name], MD;  Location: [facility] ORS;  Service: General;  Laterality: Right;  . THORACOTOMY Left 01/22/2019   Procedure: PRE OP BRONCH LEFT ANTERIOR THORACOTOMY WITH BIOPSY OF MEDIASTINAL MASS;  Surgeon: [name], MD;  Location: [facility] ORS;  Service: General;  Laterality: Left;    SOCIAL HISTORY: Social History   Socioeconomic History  . Marital status: Married    Spouse name: Not on file  . Number of children: Not on file  . Years of education: Not on file  . Highest education level: Not on file  Occupational History  .  Not on file  Tobacco Use  . Smoking status: Every Day    Packs/day: 0.50    Years: 50.00    Total pack years: 25.00    Types: Cigarettes  . Smokeless tobacco: Never  Vaping Use  . Vaping Use: Never used  Substance and Sexual Activity  . Alcohol use: No  . Drug use: Yes     Types: Barbituates, Marijuana  . Sexual activity: Not Currently  Other Topics Concern  . Not on file  Social History Narrative   Lives in [place]; wife/ son/grandson [custody]; smoker; vending business; hx of alcoholism- quit at 22.    Social Determinants of Health   Financial Resource Strain: Low Risk  (05/04/2019)   Overall Financial Resource Strain (CARDIA)   . Difficulty of Paying Living Expenses: Not hard at all  Food Insecurity: No Food Insecurity (05/04/2019)   Hunger Vital Sign   . Worried About Charity fundraiser in the Last Year: Never true   . Ran Out of Food in the Last Year: Never true  Transportation Needs: No Transportation Needs (05/04/2019)   PRAPARE - Transportation   . Lack of Transportation (Medical): No   . Lack of Transportation (Non-Medical): No  Physical Activity: Inactive (05/04/2019)   Exercise Vital Sign   . Days of Exercise per Week: 0 days   . Minutes of Exercise per Session: 0 min  Stress: No Stress Concern Present (05/04/2019)   [place]   . Feeling of Stress : Not at all  Social Connections: Unknown (05/04/2019)   Social Connection and Isolation Panel [NHANES]   . Frequency of Communication with Friends and Family: Patient refused   . Frequency of Social Gatherings with Friends and Family: Patient refused   . Attends Religious Services: Patient refused   . Active Member of Clubs or Organizations: Patient refused   . Attends Archivist Meetings: Patient refused   . Marital Status: Patient refused  Intimate Partner Violence: Not At Risk (05/04/2019)   Humiliation, Afraid, Rape, and Kick questionnaire   . Fear of Current or Ex-Partner: No   . Emotionally Abused: No   . Physically Abused: No   . Sexually Abused: No    FAMILY HISTORY: Family History  Problem Relation Age of Onset  . Lymphoma Father   . Liver cancer Paternal Uncle   . Lung cancer Paternal Uncle   .  Lung cancer Maternal Uncle     ALLERGIES:  is allergic to ferumoxytol, amoxicillin, and codeine.  MEDICATIONS:  Current Outpatient Medications  Medication Sig Dispense Refill  . amLODipine (NORVASC) 10 MG tablet Take 10 mg by mouth every morning.     . budesonide-formoterol (SYMBICORT) 160-4.5 MCG/ACT inhaler Inhale 2 puffs into the lungs 2 (two) times daily.    . clopidogrel (PLAVIX) 75 MG tablet Take 1 tablet (75 mg total) by mouth daily. 30 tablet 6  . DULoxetine (CYMBALTA) 30 MG capsule Take 90 mg by mouth every morning.    . gabapentin (NEURONTIN) 800 MG tablet Take 800 mg by mouth 3 (three) times daily.    . insulin glargine (LANTUS) 100 UNIT/ML injection Inject 10 Units into the skin 2 (two) times daily. Morning and Evening if Blood Sugar is over 150    . lidocaine-prilocaine (EMLA) cream Apply 1 application. topically as needed. 30-45 mins prior to port access. 30 g 0  . lisinopril-hydrochlorothiazide (ZESTORETIC) 10-12.5 MG tablet Take 1 tablet by mouth daily.    Marland Kitchen  metFORMIN (GLUCOPHAGE) 1000 MG tablet Take 500 mg by mouth 2 (two) times daily with a meal.    . prazosin (MINIPRESS) 1 MG capsule Take 1 mg by mouth at bedtime.    . pyridOXINE (VITAMIN B-6) 100 MG tablet Take 100 mg by mouth daily.    . sitaGLIPtin (JANUVIA) 25 MG tablet Take 25 mg by mouth daily.    . sodium chloride 1 g tablet Take 1 g by mouth 3 (three) times daily with meals.    . Ipratropium-Albuterol (COMBIVENT) 20-100 MCG/ACT AERS respimat Inhale 1 puff into the lungs every 6 (six) hours as needed.     No current facility-administered medications for this visit.   Facility-Administered Medications Ordered in Other Visits  Medication Dose Route Frequency Provider Last Rate Last Admin  . heparin lock flush 100 UNIT/ML injection           . heparin lock flush 100 UNIT/ML injection           . sodium chloride flush (NS) 0.9 % injection 10 mL  10 mL Intravenous PRN Cammie Sickle, MD   10 mL at 09/13/20  0943  . sodium chloride flush (NS) 0.9 % injection 10 mL  10 mL Intravenous PRN Cammie Sickle, MD   10 mL at 07/13/21 0948      .  PHYSICAL EXAMINATION: ECOG PERFORMANCE STATUS: 0 - Asymptomatic  Vitals:   12/05/21 1320  Temp: 98.3 F (36.8 C)   There were no vitals filed for this visit.   Physical Exam HENT:     Head: Normocephalic and atraumatic.     Mouth/Throat:     Pharynx: No oropharyngeal exudate.  Eyes:     Pupils: Pupils are equal, round, and reactive to light.  Cardiovascular:     Rate and Rhythm: Normal rate and regular rhythm.  Pulmonary:     Effort: No respiratory distress.     Breath sounds: No wheezing.  Abdominal:     General: Bowel sounds are normal. There is no distension.     Palpations: Abdomen is soft. There is no mass.     Tenderness: There is no abdominal tenderness. There is no guarding or rebound.     Comments: Abdominal incision well-healed.  Musculoskeletal:        General: No tenderness. Normal range of motion.     Cervical back: Normal range of motion and neck supple.  Skin:    General: Skin is warm.  Neurological:     Mental Status: He is alert and oriented to person, place, and time.  Psychiatric:        Mood and Affect: Affect normal.    LABORATORY DATA:  I have reviewed the data as listed Lab Results  Component Value Date   WBC 5.9 12/02/2021   HGB 11.0 (L) 12/02/2021   HCT 32.5 (L) 12/02/2021   MCV 82.1 12/02/2021   PLT 161 12/02/2021   Recent Labs    11/07/21 1106 11/30/21 1320 12/01/21 0641 12/02/21 0614  NA 134* 133* 138 137  K 4.6 4.8 4.2 3.8  CL 99 103 108 107  CO2 '24 22 24 24  ' GLUCOSE 231* 249* 141* 152*  BUN '17 18 18 10  ' CREATININE 1.10 1.11 0.98 0.62  CALCIUM 9.2 9.9 9.1 8.8*  GFRNONAA >60 >60 >60 >60  PROT 7.3 7.5 6.8  --   ALBUMIN 3.8 4.1 3.7  --   AST '20 18 21  ' --   ALT '16 16 14  ' --  ALKPHOS 61 57 49  --   BILITOT 0.6 0.6 0.6  --      RADIOGRAPHIC STUDIES: I have personally reviewed  the radiological images as listed and agreed with the findings in the report. US Carotid Bilateral  Result Date: 12/02/2021 CLINICAL DATA:  Hypertension, syncope and dizziness EXAM: BILATERAL CAROTID DUPLEX ULTRASOUND TECHNIQUE: Pearline Cables scale imaging, color Doppler and duplex ultrasound were performed of bilateral carotid and vertebral arteries in the neck. COMPARISON:  None Available. FINDINGS: Criteria: Quantification of carotid stenosis is based on velocity parameters that correlate the residual internal carotid diameter with NASCET-based stenosis levels, using the diameter of the distal internal carotid lumen as the denominator for stenosis measurement. The following velocity measurements were obtained: RIGHT ICA: 129/23 cm/sec CCA: 962/95 cm/sec SYSTOLIC ICA/CCA RATIO:  1.0 ECA: 97 cm/sec LEFT ICA: 112/30 cm/sec CCA: 284/13 cm/sec SYSTOLIC ICA/CCA RATIO:  0.9 ECA: 93 cm/sec RIGHT CAROTID ARTERY: Calcified bifurcation atherosclerosis. Despite this, no significant stenosis, velocity elevation, turbulent flow. No significant waveform abnormality. Degree of narrowing estimated less than 50% by ultrasound criteria. RIGHT VERTEBRAL ARTERY:  Normal antegrade flow LEFT CAROTID ARTERY: Slightly less calcified bifurcation atherosclerosis. Negative for significant stenosis, velocity elevation, turbulent flow. No significant waveform abnormality. Degree of narrowing also less than 50% by ultrasound criteria. LEFT VERTEBRAL ARTERY:  Normal antegrade flow IMPRESSION: Bilateral carotid atherosclerosis. Negative for stenosis. Degree of narrowing less than 50% bilaterally by ultrasound criteria. Patent antegrade vertebral flow bilaterally Electronically Signed   By: Jerilynn Mages.  Shick M.D.   On: 12/02/2021 13:56   CT Angio Abd/Pel w/ and/or w/o  Result Date: 12/02/2021 CLINICAL DATA:  Abdominal pain and rectal bleeding. Concern for mesenteric ischemia. EXAM: CTA ABDOMEN AND PELVIS WITHOUT AND WITH CONTRAST TECHNIQUE: Multidetector  CT imaging of the abdomen and pelvis was performed using the standard protocol during bolus administration of intravenous contrast. Multiplanar reconstructed images and MIPs were obtained and reviewed to evaluate the vascular anatomy. RADIATION DOSE REDUCTION: This exam was performed according to the departmental dose-optimization program which includes automated exposure control, adjustment of the mA and/or kV according to patient size and/or use of iterative reconstruction technique. CONTRAST:  155m OMNIPAQUE IOHEXOL 350 MG/ML SOLN COMPARISON:  CT AP 11/30/2021 FINDINGS: VASCULAR Aorta: Normal caliber aorta without aneurysm, dissection, vasculitis or significant stenosis. Extensive aortic atherosclerotic calcifications. Celiac: Patent without evidence of aneurysm, dissection, vasculitis or significant stenosis. SMA: Patent without evidence of aneurysm, dissection, vasculitis or significant stenosis. Renals: Both renal arteries are patent without evidence of aneurysm, dissection, vasculitis, fibromuscular dysplasia or significant stenosis. There is a short segment IMA: High-grade, nonocclusive luminal narrowing secondary to calcified plaque within the proximal IMA, image 96/5. A second area of high-grade, nonocclusive luminal narrowing is noted just beyond the bifurcation of the IMA, image 111/5. The branches of the IMA appear well opacified beyond this area of stenosis. Inflow: Patent without evidence of aneurysm, dissection, vasculitis or significant stenosis. Proximal Outflow: Atherosclerotic calcifications involve bilateral common iliac arteries and their branches without occlusion. Veins: Extensive left upper quadrant varicosities are identified. Review of the MIP images confirms the above findings. NON-VASCULAR Lower chest: No acute abnormality. Hepatobiliary: Unchanged appearance of previously characterized subcapsular metastasis within the periphery of the right hepatic lobe measuring 1.6 x 0.9 cm, image  31/12. No new focal liver abnormalities. Gallbladder is unremarkable. Pancreas: On the arterial phase images there is hypoenhancement involving the neck of the pancreas measuring 3.0 x 2.8 by 2.4 cm. Additionally, there is focal luminal narrowing of the portal vein  just beyond the portal venous confluence, image 52/2 which when compared with remote study from 06/07/2020 is a new finding. This is moderately suspicious for underlying neoplastic process involving the pancreas. Body and tail of pancreas are chronically atrophic or congenitally absent. Spleen: Normal in size without focal abnormality. Adrenals/Urinary Tract: Normal appearance of the adrenal glands. No suspicious kidney mass or hydronephrosis identified. Urinary bladder appears normal. Stomach/Bowel: The stomach appears within normal limits. Postoperative changes from previous right hemicolectomy. Enterocolonic anastomosis has been performed in the right hemiabdomen. The colon appears diffusely decompressed. No significant bowel wall thickening or inflammation. No pathologic dilatation of the abdomen or pelvis. Sigmoid diverticulosis noted without signs of acute diverticulitis. No signs of arterial phase intraluminal contrast of extravasation of intravenous contrast material to suggest active bleeding. On the delayed phase imaging there is no pooling of intravenous contrast material within the large or small bowel loops. Lymphatic: No signs abdominopelvic adenopathy identified. Reproductive: Prostate gland appears enlarged. Other: No free fluid or fluid collections within the abdomen or pelvis. No signs of pneumoperitoneum. Musculoskeletal: Degenerative disc disease noted within the lumbar spine. IMPRESSION: 1. No signs of arterial phase intraluminal contrast extravasation of intravenous contrast material to suggest active bleeding. On the delayed phase imaging there is no pooling of intravenous contrast material within the large or small bowel loops. 2.  High-grade, nonocclusive luminal narrowing of the proximal IMA. A second focus of high-grade luminal narrowing due to calcified plaque is noted within a proximal branch of the IMA to the rectum. The distal branches of the IMA appear well opacified beyond these areas of stenosis. 3. There is a subtle area hypoenhancement involving the neck of pancreas which results in focal luminal narrowing of the portal vein just beyond the portal venous confluence. This is a new finding from 11/30/2021 and is moderately suspicious. Underlying neoplastic process cannot be excluded. When the patient is clinically stable, is able to suspend respirations and remain motionless consider more definitive characterization with contrast enhanced liver protocol MRI. Alternatively, a restaging PET-CT may be helpful to assess for abnormal hypermetabolism within this area in this patient known liver metastasis. 4. Stable appearance of subcapsular metastasis within the periphery of the right hepatic lobe. 5. Status post right hemicolectomy with ileocolic anastomosis. 6. Sigmoid diverticulosis without signs of acute diverticulitis. 7. Enlarged prostate gland. 8. Aortic Atherosclerosis (ICD10-I70.0). Electronically Signed   By: Kerby Moors M.D.   On: 12/02/2021 12:40   CT ABDOMEN PELVIS W CONTRAST  Result Date: 11/30/2021 CLINICAL DATA:  Abdominal pain, acute, nonlocalized. two episodes of dark tarry stools this week. Previously treated for colon cancer. Also reports dizziness, and indigestion. Reports poor appetite for the last three days due to nausea and epigastric pain. EXAM: CT ABDOMEN AND PELVIS WITH CONTRAST TECHNIQUE: Multidetector CT imaging of the abdomen and pelvis was performed using the standard protocol following bolus administration of intravenous contrast. RADIATION DOSE REDUCTION: This exam was performed according to the departmental dose-optimization program which includes automated exposure control, adjustment of the mA  and/or kV according to patient size and/or use of iterative reconstruction technique. CONTRAST:  129m OMNIPAQUE IOHEXOL 300 MG/ML  SOLN COMPARISON:  CT abdomen pelvis 12/07/2020 FINDINGS: Lower chest: Similar-appearing likely distal esophageal wall lipomatous lesion (2:9). No acute abnormality. Hepatobiliary: Vague peripheral right hepatic lobe hypodensity (2:11) consistent with a biopsy-proven metastasis. No gallstones, gallbladder wall thickening, or pericholecystic fluid. No biliary dilatation. Pancreas: No focal lesion. Normal pancreatic contour. No surrounding inflammatory changes. No main pancreatic ductal dilatation.  Spleen: Normal in size without focal abnormality. Adrenals/Urinary Tract: No adrenal nodule bilaterally. Bilateral kidneys enhance symmetrically. A 1.2 cm fluid density lesion within left kidney statistically likely represents a simple renal cyst. Simple renal cysts, in the absence of clinically indicated signs/symptoms, require no independent follow-up. No hydronephrosis. No hydroureter. The urinary bladder is unremarkable. On delayed imaging, there is no urothelial wall thickening and there are no filling defects in the opacified portions of the bilateral collecting systems or ureters. Stomach/Bowel: Partial colectomy involving the right colon. Stomach is within normal limits. No evidence of bowel wall thickening or dilatation. Scattered colonic diverticulosis. Appendix appears normal. Vascular/Lymphatic: The main portal, splenic, superior mesenteric veins are patent. Perigastric and left upper quadrant venous collaterals. No abdominal aorta or iliac aneurysm. Severe atherosclerotic plaque of the aorta and its branches. No abdominal, pelvic, or inguinal lymphadenopathy. Reproductive: Prostate is unremarkable. Other: No intraperitoneal free fluid. No intraperitoneal free gas. No organized fluid collection. Musculoskeletal: No abdominal wall hernia or abnormality. No suspicious lytic or blastic  osseous lesions. No acute displaced fracture. L5-S1 intervertebral disc space vacuum phenomenon and endplate sclerosis as well as posterior disc osteophyte complex formation, osteophyte formation, facet arthropathy. IMPRESSION: 1. Perigastric and left upper quadrant venous collaterals. Limited evaluation for an active gastrointestinal hemorrhage on this portal venous study. 2. Status post partial colectomy with treated right hepatic metastasis. 3. Aortic Atherosclerosis (ICD10-I70.0) - severe. Electronically Signed   By: Iven Finn M.D.   On: 11/30/2021 17:23   CT HEAD WO CONTRAST (5MM)  Result Date: 11/30/2021 CLINICAL DATA:  Head trauma, minor (Age >= 65y) EXAM: CT HEAD WITHOUT CONTRAST TECHNIQUE: Contiguous axial images were obtained from the base of the skull through the vertex without intravenous contrast. RADIATION DOSE REDUCTION: This exam was performed according to the departmental dose-optimization program which includes automated exposure control, adjustment of the mA and/or kV according to patient size and/or use of iterative reconstruction technique. COMPARISON:  MRI head 02/04/2019 FINDINGS: Brain: No evidence of large-territorial acute infarction. No parenchymal hemorrhage. No mass lesion. No extra-axial collection. No mass effect or midline shift. No hydrocephalus. Basilar cisterns are patent. Vascular: No hyperdense vessel. Atherosclerotic calcifications are present within the cavernous internal carotid and vertebral arteries. Skull: No acute fracture or focal lesion. Sinuses/Orbits: Paranasal sinuses and mastoid air cells are clear. The orbits are unremarkable. Other: None. IMPRESSION: No acute intracranial abnormality. Electronically Signed   By: Iven Finn M.D.   On: 11/30/2021 17:12   ECHOCARDIOGRAM COMPLETE  Result Date: 11/13/2021    ECHOCARDIOGRAM REPORT   Patient Name:   Kevin Mckay Date of Exam: 11/13/2021 Medical Rec #:  [id number]       Height:       68.0 in Accession #:     [id number]      Weight:       131.8 lb Date of Birth:  March 20, 1951      BSA:          1.712 m Patient Age:    57 years        BP:           153/79 mmHg Patient Gender: M               HR:           66 bpm. Exam Location:  [facility] Procedure: 2D Echo, Cardiac Doppler and Color Doppler Indications:     R55 Syncope  History:         Patient has no prior  history of Echocardiogram examinations.                  Asthma, Lung cancer, Colon cancer; Risk Factors:Current Smoker,                  Hypertension and Diabetes.  Sonographer:     Rosalia Hammers Referring Phys:  [id number] Cammie Sickle Diagnosing Phys: Kathlyn Sacramento MD  Sonographer Comments: Suboptimal parasternal window and suboptimal subcostal window. Image acquisition challenging due to patient body habitus and Image acquisition challenging due to respiratory motion. IMPRESSIONS  1. Left ventricular ejection fraction, by estimation, is 60 to 65%. The left ventricle has normal function. The left ventricle has no regional wall motion abnormalities. There is mild left ventricular hypertrophy. Left ventricular diastolic parameters were normal.  2. Right ventricular systolic function is normal. The right ventricular size is normal. Tricuspid regurgitation signal is inadequate for assessing PA pressure.  3. The mitral valve is normal in structure. No evidence of mitral valve regurgitation. No evidence of mitral stenosis.  4. The aortic valve is normal in structure. Aortic valve regurgitation is not visualized. Aortic valve sclerosis/calcification is present, without any evidence of aortic stenosis.  5. The inferior vena cava is normal in size with greater than 50% respiratory variability, suggesting right atrial pressure of 3 mmHg. FINDINGS  Left Ventricle: Left ventricular ejection fraction, by estimation, is 60 to 65%. The left ventricle has normal function. The left ventricle has no regional wall motion abnormalities. The left ventricular internal cavity size was  normal in size. There is  mild left ventricular hypertrophy. Left ventricular diastolic parameters were normal. Right Ventricle: The right ventricular size is normal. No increase in right ventricular wall thickness. Right ventricular systolic function is normal. Tricuspid regurgitation signal is inadequate for assessing PA pressure. Left Atrium: Left atrial size was normal in size. Right Atrium: Right atrial size was normal in size. Pericardium: There is no evidence of pericardial effusion. Mitral Valve: The mitral valve is normal in structure. No evidence of mitral valve regurgitation. No evidence of mitral valve stenosis. MV peak gradient, 5.1 mmHg. The mean mitral valve gradient is 2.0 mmHg. Tricuspid Valve: The tricuspid valve is normal in structure. Tricuspid valve regurgitation is not demonstrated. No evidence of tricuspid stenosis. Aortic Valve: The aortic valve is normal in structure. Aortic valve regurgitation is not visualized. Aortic valve sclerosis/calcification is present, without any evidence of aortic stenosis. Aortic valve mean gradient measures 3.0 mmHg. Aortic valve peak  gradient measures 4.7 mmHg. Aortic valve area, by VTI measures 2.73 cm. Pulmonic Valve: The pulmonic valve was normal in structure. Pulmonic valve regurgitation is not visualized. No evidence of pulmonic stenosis. Aorta: The aortic root is normal in size and structure. Venous: The inferior vena cava is normal in size with greater than 50% respiratory variability, suggesting right atrial pressure of 3 mmHg. IAS/Shunts: No atrial level shunt detected by color flow Doppler.  LEFT VENTRICLE PLAX 2D LVIDd:         3.99 cm   Diastology LVIDs:         2.18 cm   LV e' medial:    8.27 cm/s LV PW:         1.28 cm   LV E/e' medial:  14.1 LV IVS:        1.22 cm   LV e' lateral:   10.20 cm/s LVOT diam:     1.90 cm   LV E/e' lateral: 11.5 LV SV:  67 LV SV Index:   39 LVOT Area:     2.84 cm  RIGHT VENTRICLE RV Basal diam:  3.22 cm RV S  prime:     13.50 cm/s LEFT ATRIUM             Index        RIGHT ATRIUM          Index LA diam:        2.90 cm 1.69 cm/m   RA Area:     8.91 cm LA Vol (A2C):   42.5 ml 24.83 ml/m  RA Volume:   14.60 ml 8.53 ml/m LA Vol (A4C):   28.9 ml 16.88 ml/m LA Biplane Vol: 36.2 ml 21.15 ml/m  AORTIC VALVE                    PULMONIC VALVE AV Area (Vmax):    2.99 cm     PV Vmax:       1.02 m/s AV Area (Vmean):   2.77 cm     PV Vmean:      66.600 cm/s AV Area (VTI):     2.73 cm     PV VTI:        0.207 m AV Vmax:           108.00 cm/s  PV Peak grad:  4.2 mmHg AV Vmean:          75.500 cm/s  PV Mean grad:  2.0 mmHg AV VTI:            0.247 m AV Peak Grad:      4.7 mmHg AV Mean Grad:      3.0 mmHg LVOT Vmax:         114.00 cm/s LVOT Vmean:        73.800 cm/s LVOT VTI:          0.238 m LVOT/AV VTI ratio: 0.96  AORTA Ao Root diam: 3.20 cm MITRAL VALVE MV Area (PHT): 4.19 cm     SHUNTS MV Area VTI:   1.76 cm     Systemic VTI:  0.24 m MV Peak grad:  5.1 mmHg     Systemic Diam: 1.90 cm MV Mean grad:  2.0 mmHg MV Vmax:       1.13 m/s MV Vmean:      67.3 cm/s MV Decel Time: 181 msec MV E velocity: 117.00 cm/s MV A velocity: 115.00 cm/s MV E/A ratio:  1.02 Kathlyn Sacramento MD Electronically signed by Kathlyn Sacramento MD Signature Date/Time: 11/13/2021/1:08:34 PM    Final    MR BRAIN W WO CONTRAST  Result Date: 11/08/2021 CLINICAL DATA:  Non-small-cell lung cancer, dizziness, headaches EXAM: MRI HEAD WITHOUT AND WITH CONTRAST TECHNIQUE: Multiplanar, multiecho pulse sequences of the brain and surrounding structures were obtained without and with intravenous contrast. CONTRAST:  28m GADAVIST GADOBUTROL 1 MMOL/ML IV SOLN COMPARISON:  Brain MRI 02/04/2019 FINDINGS: Brain: There is no acute intracranial hemorrhage, extra-axial fluid collection, or acute infarct. Parenchymal volume is normal. The ventricles are normal in size. Gray-white differentiation is preserved. There are scattered small foci of FLAIR signal abnormality in the  subcortical and periventricular white matter, nonspecific but likely reflecting sequela of minimal chronic white matter microangiopathy, not substantially changed since 2020 There is no suspicious parenchymal signal abnormality. There is no mass lesion. There is no abnormal enhancement. There is no mass effect or midline shift. Vascular: Normal flow voids. Skull and upper cervical spine: Normal marrow signal. Sinuses/Orbits:  There is mild mucosal thickening in the paranasal sinuses. The globes and orbits are unremarkable. Other: There are bilateral mastoid effusions, unchanged on the right but new on the left. The imaged nasopharynx is unremarkable. IMPRESSION: 1. No evidence of intracranial metastatic disease or other acute intracranial pathology. 2. Bilateral mastoid effusions, unchanged on the right but new on the left. Electronically Signed   By: Valetta Mole M.D.   On: 11/08/2021 12:54    ASSESSMENT & PLAN:   No problem-specific Assessment & Plan notes found for this encounter.   All questions were answered. The patient knows to call the clinic with any problems, questions or concerns.    Cammie Sickle, MD 12/05/2021 1:28 PM

## 2021-12-05 NOTE — Assessment & Plan Note (Signed)
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 April 12th 2023-no evidence of progression of subcapsular liver lesion/biopsy-proven colon cancer; no evidence of new disease.   # LEFT hilar/LUL-  Limited stage small cell cancer-s/p chemo-RT; April 12th, 2023-CT chest- Unchanged perihilar and suprahilar post treatment/post radiation fibrosis and consolidation of the left upper lobe. STABLE.   there is hypoenhancement involving the neck of the pancreas measuring 3.0 x 2.8 by 2.4 cm.  # Syncopal episodes: random lightheaded; slouch to ground; at least a mins [4-5 times day]; headaches;  MRI of the brain- MAY 2023- NEGATIVE for metastases.   # Hypotension/headache: ? Etiology; Bilateral carotid atherosclerosis. Negative for stenosis. Degree of narrowing less than 50% bilaterally by ultrasound criteria  # Anemia- secondary- sec to chemo-hemoglobin on 10-11-STABLE.   #Hyponatremia sodium 132- STABLE.  # Back pain- MRI lumbar spine- July 2021-NEG for mets; severe arthritic changes; spinal canal stenosis-stable hydrocodone 1 day prn - STABLE.   # Poorly controlled diabetes blood sugars-better controlled as per patients-overall stable [id number]-  # / prostatism symptom:   # DISPOSITION:  # IVFs 0.9 NS over 1 hour; # PET scan ASAP # Follow up NP on 6/16- labs- cbc/cmp/Ca-19-9; CEA- IVFs over 1 hours # Keep MD appt on 6/29 as planned- --Dr.B  Cc; Kevin Mckay-

## 2021-12-05 NOTE — Patient Instructions (Addendum)
#  Stop the blood pressure medication lisinopril; and amlodipine

## 2021-12-05 NOTE — Progress Notes (Unsigned)
Pt unsure what he was being treated for in the hospital.  Having dizziness and falling. Headaches, constipation, stomach pain.  Asking if he should d/c plavix?

## 2021-12-06 ENCOUNTER — Ambulatory Visit (INDEPENDENT_AMBULATORY_CARE_PROVIDER_SITE_OTHER): Payer: BC Managed Care – PPO | Admitting: Nurse Practitioner

## 2021-12-06 ENCOUNTER — Encounter: Payer: Self-pay | Admitting: Internal Medicine

## 2021-12-06 ENCOUNTER — Other Ambulatory Visit (INDEPENDENT_AMBULATORY_CARE_PROVIDER_SITE_OTHER): Payer: Medicare Other

## 2021-12-06 ENCOUNTER — Other Ambulatory Visit (INDEPENDENT_AMBULATORY_CARE_PROVIDER_SITE_OTHER): Payer: Self-pay | Admitting: Nurse Practitioner

## 2021-12-06 ENCOUNTER — Encounter (INDEPENDENT_AMBULATORY_CARE_PROVIDER_SITE_OTHER): Payer: Self-pay | Admitting: Nurse Practitioner

## 2021-12-06 ENCOUNTER — Encounter (INDEPENDENT_AMBULATORY_CARE_PROVIDER_SITE_OTHER): Payer: Self-pay

## 2021-12-06 VITALS — BP 134/69 | HR 71 | Resp 16 | Wt 137.0 lb

## 2021-12-06 DIAGNOSIS — E1140 Type 2 diabetes mellitus with diabetic neuropathy, unspecified: Secondary | ICD-10-CM

## 2021-12-06 DIAGNOSIS — I10 Essential (primary) hypertension: Secondary | ICD-10-CM

## 2021-12-06 DIAGNOSIS — Z794 Long term (current) use of insulin: Secondary | ICD-10-CM

## 2021-12-06 DIAGNOSIS — K551 Chronic vascular disorders of intestine: Secondary | ICD-10-CM | POA: Diagnosis not present

## 2021-12-06 NOTE — Progress Notes (Signed)
Subjective:    Patient ID: Kevin Mckay, male    DOB: Aug 26, 1950, 71 y.o.   MRN: [id number] Chief Complaint  Patient presents with   Follow-up    Ultrasound follow up    Kevin Mckay is a 71 year old male who presents today at the request of Dr. Lynett Fish today for evaluation of abdominal pain and postprandial discomfort.  The patient notes that he went to the emergency room with pain and diarrhea and since his release the pain has gotten under control.  He had a colonoscopy as well as an endoscopy that did not show any significant issues.  However he underwent a CT angiogram which showed significant stenosis of his IMA.  The patient has had a previous history of a colectomy.  Also in the midst of his work-up it was found in the stomach mass as well as 1 in the pancreas.  He has also had that fainting spells with dizziness and falls.    Review of Systems  Gastrointestinal:  Positive for abdominal pain, diarrhea and nausea.       Objective:   Physical Exam Vitals reviewed.  HENT:     Head: Normocephalic.  Cardiovascular:     Rate and Rhythm: Normal rate.  Pulmonary:     Effort: Pulmonary effort is normal.  Skin:    General: Skin is warm and dry.  Neurological:     Mental Status: He is alert and oriented to person, place, and time.  Psychiatric:        Mood and Affect: Mood normal.        Behavior: Behavior normal.        Thought Content: Thought content normal.        Judgment: Judgment normal.     BP 134/69 (BP Location: Left Arm)   Pulse 71   Resp 16   Wt 137 lb (62.1 kg)   BMI 20.83 kg/m   Past Medical History:  Diagnosis Date   Asthma    Cancer ([place])    Metastatic small cell lung cancer   Cerebral aneurysm    Chronic painful diabetic neuropathy (HCC)    Depression    Diabetes mellitus without complication (HCC)    Essential hypertension    History of kidney stones    Occasional tremors    Tobacco use     Social History   Socioeconomic History    Marital status: Married    Spouse name: Not on file   Number of children: Not on file   Years of education: Not on file   Highest education level: Not on file  Occupational History   Not on file  Tobacco Use   Smoking status: Every Day    Packs/day: 0.50    Years: 50.00    Total pack years: 25.00    Types: Cigarettes   Smokeless tobacco: Never  Vaping Use   Vaping Use: Never used  Substance and Sexual Activity   Alcohol use: No   Drug use: Yes    Types: Barbituates, Marijuana   Sexual activity: Not Currently  Other Topics Concern   Not on file  Social History Narrative   Lives in [place]; wife/ son/grandson [custody]; smoker; vending business; hx of alcoholism- quit at 7.    Social Determinants of Health   Financial Resource Strain: Low Risk  (05/04/2019)   Overall Financial Resource Strain (CARDIA)    Difficulty of Paying Living Expenses: Not hard at all  Food Insecurity: No Food Insecurity (05/04/2019)  Hunger Vital Sign    Worried About Running Out of Food in the Last Year: Never true    Ran Out of Food in the Last Year: Never true  Transportation Needs: No Transportation Needs (05/04/2019)   PRAPARE - Hydrologist (Medical): No    Lack of Transportation (Non-Medical): No  Physical Activity: Inactive (05/04/2019)   Exercise Vital Sign    Days of Exercise per Week: 0 days    Minutes of Exercise per Session: 0 min  Stress: No Stress Concern Present (05/04/2019)   [place]    Feeling of Stress : Not at all  Social Connections: Unknown (05/04/2019)   Social Connection and Isolation Panel [NHANES]    Frequency of Communication with Friends and Family: Patient refused    Frequency of Social Gatherings with Friends and Family: Patient refused    Attends Religious Services: Patient refused    Active Member of Clubs or Organizations: Patient refused    Attends Theatre manager Meetings: Patient refused    Marital Status: Patient refused  Intimate Partner Violence: Not At Risk (05/04/2019)   Humiliation, Afraid, Rape, and Kick questionnaire    Fear of Current or Ex-Partner: No    Emotionally Abused: No    Physically Abused: No    Sexually Abused: No    Past Surgical History:  Procedure Laterality Date   ABDOMINAL SURGERY     age 85. trauma surgery due to forklift injury- liver and spleen   COLONOSCOPY WITH PROPOFOL N/A 03/27/2019   Procedure: COLONOSCOPY WITH PROPOFOL;  Surgeon: Jonathon Bellows, MD;  Location: [facility] ENDOSCOPY;  Service: Gastroenterology;  Laterality: N/A;   COLONOSCOPY WITH PROPOFOL N/A 12/02/2021   Procedure: COLONOSCOPY WITH PROPOFOL;  Surgeon: Lesly Rubenstein, MD;  Location: [facility] ENDOSCOPY;  Service: Endoscopy;  Laterality: N/A;   COLOSTOMY REVISION Right 05/04/2019   Procedure: COLON RESECTION RIGHT-right hemicolectomy-open;  Surgeon: Fredirick Maudlin, MD;  Location: [facility] ORS;  Service: General;  Laterality: Right;   ESOPHAGOGASTRODUODENOSCOPY N/A 12/02/2021   Procedure: ESOPHAGOGASTRODUODENOSCOPY (EGD);  Surgeon: Lesly Rubenstein, MD;  Location: [facility] ENDOSCOPY;  Service: Endoscopy;  Laterality: N/A;   ESOPHAGOGASTRODUODENOSCOPY (EGD) WITH PROPOFOL N/A 03/27/2019   Procedure: ESOPHAGOGASTRODUODENOSCOPY (EGD) WITH PROPOFOL;  Surgeon: Jonathon Bellows, MD;  Location: [facility] ENDOSCOPY;  Service: Gastroenterology;  Laterality: N/A;   IR GENERIC HISTORICAL  05/31/2016   IR ANGIO VERTEBRAL SEL VERTEBRAL BILAT MOD SED 05/31/2016 Consuella Lose, MD [facility]INTER[facility]   IR GENERIC HISTORICAL  05/31/2016   IR ANGIO INTRA EXTRACRAN SEL INTERNAL CAROTID BILAT MOD SED 05/31/2016 Consuella Lose, MD [facility]   PORTACATH PLACEMENT Right 02/04/2019   Procedure: INSERTION PORT-A-CATH;  Surgeon: [name], MD;  Location: [facility] ORS;  Service: General;  Laterality: Right;   THORACOTOMY Left 01/22/2019   Procedure: PRE OP BRONCH LEFT ANTERIOR THORACOTOMY  WITH BIOPSY OF MEDIASTINAL MASS;  Surgeon: [name], MD;  Location: [facility] ORS;  Service: General;  Laterality: Left;    Family History  Problem Relation Age of Onset   Lymphoma Father    Liver cancer Paternal Uncle    Lung cancer Paternal Uncle    Lung cancer Maternal Uncle     Allergies  Allergen Reactions   Ferumoxytol Shortness Of Breath and Other (See Comments)    Infusion reaction-shortness of breath/ tachycardia/hypotension.   Amoxicillin Hives    Did it involve swelling of the face/tongue/throat, SOB, or low BP? No Did it involve sudden or  severe rash/hives, skin peeling, or any reaction on the inside of your mouth or nose? Yes Did you need to seek medical attention at a hospital or doctor's office? No When did it last happen?~5 years ago If all above answers are "NO", may proceed with cephalosporin use.    Codeine Nausea Only       Latest Ref Rng & Units 12/02/2021    6:14 AM 12/01/2021    6:41 AM 11/30/2021    1:20 PM  CBC  WBC 4.0 - 10.5 K/uL 5.9  5.8  6.9   Hemoglobin 13.0 - 17.0 g/dL 11.0  11.9  12.9   Hematocrit 39.0 - 52.0 % 32.5  36.5  38.7   Platelets 150 - 400 K/uL 161  167  202       CMP     Component Value Date/Time   NA 137 12/02/2021 0614   K 3.8 12/02/2021 0614   CL 107 12/02/2021 0614   CO2 24 12/02/2021 0614   GLUCOSE 152 (H) 12/02/2021 0614   BUN 10 12/02/2021 0614   CREATININE 0.62 12/02/2021 0614   CALCIUM 8.8 (L) 12/02/2021 0614   PROT 6.8 12/01/2021 0641   ALBUMIN 3.7 12/01/2021 0641   AST 21 12/01/2021 0641   ALT 14 12/01/2021 0641   ALKPHOS 49 12/01/2021 0641   BILITOT 0.6 12/01/2021 0641   GFRNONAA >60 12/02/2021 0614   GFRAA >60 03/23/2020 0850     VAS [place] ABI WITH/WO TBI  Result Date: 10/26/2021  LOWER EXTREMITY DOPPLER STUDY Patient Name:  Jazion Atteberry  Date of Exam:   10/24/2021 Medical Rec #: [id number]        Accession #:    [id number] Date of Birth: 20-Feb-1951       Patient Gender: M Patient Age:   27 years Exam  Location:  [facility] Procedure:      VAS [place] ABI WITH/WO TBI Referring Phys: Hortencia Pilar --------------------------------------------------------------------------------  Indications: Claudication, and rest pain.  Comparison Study: 04/26/2021 Performing Technologist: Almira Coaster RVS  Examination Guidelines: A complete evaluation includes at minimum, Doppler waveform signals and systolic blood pressure reading at the level of bilateral brachial, anterior tibial, and posterior tibial arteries, when vessel segments are accessible. Bilateral testing is considered an integral part of a complete examination. Photoelectric Plethysmograph (PPG) waveforms and toe systolic pressure readings are included as required and additional duplex testing as needed. Limited examinations for reoccurring indications may be performed as noted.  ABI Findings: +---------+------------------+-----+----------+--------+ Right    Rt Pressure (mmHg)IndexWaveform  Comment  +---------+------------------+-----+----------+--------+ Brachial 154                                       +---------+------------------+-----+----------+--------+ ATA      115               0.74 monophasic         +---------+------------------+-----+----------+--------+ PTA      104               0.67 monophasic         +---------+------------------+-----+----------+--------+ Great Toe86                0.55 Abnormal           +---------+------------------+-----+----------+--------+ +---------+------------------+-----+---------+-------+ Left     Lt Pressure (mmHg)IndexWaveform Comment +---------+------------------+-----+---------+-------+ Brachial 155                                     +---------+------------------+-----+---------+-------+  ATA      149               0.96 triphasic        +---------+------------------+-----+---------+-------+ PTA      140               0.90 triphasic         +---------+------------------+-----+---------+-------+ Great Toe129               0.83 Normal           +---------+------------------+-----+---------+-------+ +-------+-----------+-----------+------------+------------+ ABI/TBIToday's ABIToday's TBIPrevious ABIPrevious TBI +-------+-----------+-----------+------------+------------+ Right  .74        .55        .67         .62          +-------+-----------+-----------+------------+------------+ Left   .96        .83        .90         .67          +-------+-----------+-----------+------------+------------+ Left ABIs and TBIs appear increased compared to prior study on 04/26/2021. Right ABIs appear increased compared to prior study on 04/26/2021. Rt TBIs appear to be decreased compared to prior study on 04/26/2021.  Summary: Right: Resting right ankle-brachial index indicates moderate right lower extremity arterial disease. The right toe-brachial index is abnormal. Left: Resting left ankle-brachial index is within normal range. No evidence of significant left lower extremity arterial disease. The left toe-brachial index is abnormal.  *See table(s) above for measurements and observations.  Electronically signed by Hortencia Pilar MD on 10/26/2021 at 4:44:23 PM.    Final        Assessment & Plan:   1. Stenosis of inferior mesenteric artery (HCC) Recommend:  The patient has evidence of severe atherosclerotic changes of the mesenteric arteries associated with weight loss as well as abdominal pain and N/V.  This represents a high risk for bowel infarction.  Patient should undergo angiography of the mesenteric arteries with the hope for intervention to eliminate the ischemic symptoms.    The risks and benefits as well as the alternative therapies was discussed in detail with the patient.  All questions were answered.  Patient agrees to proceed with angiography and intervention.  The patient will follow up with me after the angiogram.    2. Essential hypertension Continue antihypertensive medications as already ordered, these medications have been reviewed and there are no changes at this time.   3. Type 2 diabetes mellitus with diabetic neuropathy, with long-term current use of insulin ([place]) Continue hypoglycemic medications as already ordered, these medications have been reviewed and there are no changes at this time.  Hgb A1C to be monitored as already arranged by primary service    Current Outpatient Medications on File Prior to Visit  Medication Sig Dispense Refill   budesonide-formoterol (SYMBICORT) 160-4.5 MCG/ACT inhaler Inhale 2 puffs into the lungs 2 (two) times daily.     clopidogrel (PLAVIX) 75 MG tablet Take 1 tablet (75 mg total) by mouth daily. 30 tablet 6   DULoxetine (CYMBALTA) 30 MG capsule Take 90 mg by mouth every morning.     gabapentin (NEURONTIN) 800 MG tablet Take 800 mg by mouth 3 (three) times daily.     insulin glargine (LANTUS) 100 UNIT/ML injection Inject 10 Units into the skin 2 (two) times daily. Morning and Evening if Blood Sugar is over 150     metFORMIN (GLUCOPHAGE) 1000 MG tablet Take 500 mg by mouth 2 (two) times  daily with a meal.     prazosin (MINIPRESS) 1 MG capsule Take 1 mg by mouth at bedtime.     pyridOXINE (VITAMIN B-6) 100 MG tablet Take 100 mg by mouth daily.     sodium chloride 1 g tablet Take 1 g by mouth 3 (three) times daily with meals.     amLODipine (NORVASC) 10 MG tablet Take 10 mg by mouth every morning.  (Patient not taking: Reported on 12/06/2021)     Ipratropium-Albuterol (COMBIVENT) 20-100 MCG/ACT AERS respimat Inhale 1 puff into the lungs every 6 (six) hours as needed.     lidocaine-prilocaine (EMLA) cream Apply 1 application. topically as needed. 30-45 mins prior to port access. (Patient not taking: Reported on 12/06/2021) 30 g 0   lisinopril-hydrochlorothiazide (ZESTORETIC) 10-12.5 MG tablet Take 1 tablet by mouth daily. (Patient not taking: Reported on 12/06/2021)      sitaGLIPtin (JANUVIA) 25 MG tablet Take 25 mg by mouth daily. (Patient not taking: Reported on 12/06/2021)     Current Facility-Administered Medications on File Prior to Visit  Medication Dose Route Frequency Provider Last Rate Last Admin   heparin lock flush 100 UNIT/ML injection            heparin lock flush 100 UNIT/ML injection            sodium chloride flush (NS) 0.9 % injection 10 mL  10 mL Intravenous PRN Charlaine Dalton R, MD   10 mL at 09/13/20 0943   sodium chloride flush (NS) 0.9 % injection 10 mL  10 mL Intravenous PRN Cammie Sickle, MD   10 mL at 07/13/21 0948    There are no Patient Instructions on file for this visit. No follow-ups on file.   Kris Hartmann, NP

## 2021-12-08 ENCOUNTER — Inpatient Hospital Stay: Payer: BC Managed Care – PPO

## 2021-12-08 ENCOUNTER — Inpatient Hospital Stay: Payer: BC Managed Care – PPO | Admitting: Medical Oncology

## 2021-12-08 ENCOUNTER — Encounter: Payer: Self-pay | Admitting: Medical Oncology

## 2021-12-08 VITALS — BP 175/71 | HR 61 | Temp 96.0°F | Resp 16

## 2021-12-08 VITALS — BP 189/73 | HR 61 | Temp 96.0°F | Resp 16

## 2021-12-08 DIAGNOSIS — R55 Syncope and collapse: Secondary | ICD-10-CM | POA: Diagnosis not present

## 2021-12-08 DIAGNOSIS — C801 Malignant (primary) neoplasm, unspecified: Secondary | ICD-10-CM

## 2021-12-08 DIAGNOSIS — E860 Dehydration: Secondary | ICD-10-CM

## 2021-12-08 DIAGNOSIS — E871 Hypo-osmolality and hyponatremia: Secondary | ICD-10-CM

## 2021-12-08 DIAGNOSIS — C184 Malignant neoplasm of transverse colon: Secondary | ICD-10-CM | POA: Diagnosis not present

## 2021-12-08 DIAGNOSIS — C781 Secondary malignant neoplasm of mediastinum: Secondary | ICD-10-CM | POA: Diagnosis not present

## 2021-12-08 DIAGNOSIS — K8689 Other specified diseases of pancreas: Secondary | ICD-10-CM

## 2021-12-08 DIAGNOSIS — I959 Hypotension, unspecified: Secondary | ICD-10-CM

## 2021-12-08 DIAGNOSIS — C182 Malignant neoplasm of ascending colon: Secondary | ICD-10-CM

## 2021-12-08 DIAGNOSIS — D649 Anemia, unspecified: Secondary | ICD-10-CM

## 2021-12-08 LAB — COMPREHENSIVE METABOLIC PANEL
ALT: 14 U/L (ref 0–44)
AST: 14 U/L — ABNORMAL LOW (ref 15–41)
Albumin: 3.5 g/dL (ref 3.5–5.0)
Alkaline Phosphatase: 53 U/L (ref 38–126)
Anion gap: 9 (ref 5–15)
BUN: 11 mg/dL (ref 8–23)
CO2: 26 mmol/L (ref 22–32)
Calcium: 9 mg/dL (ref 8.9–10.3)
Chloride: 98 mmol/L (ref 98–111)
Creatinine, Ser: 0.88 mg/dL (ref 0.61–1.24)
GFR, Estimated: >60 mL/min (ref >60)
Glucose, Bld: 210 mg/dL — ABNORMAL HIGH (ref 70–99)
Potassium: 4.2 mmol/L (ref 3.5–5.1)
Sodium: 133 mmol/L — ABNORMAL LOW (ref 135–145)
Total Bilirubin: 0.5 mg/dL (ref 0.3–1.2)
Total Protein: 6.9 g/dL (ref 6.5–8.1)

## 2021-12-08 LAB — CBC WITH DIFFERENTIAL/PLATELET
Abs Immature Granulocytes: 0.03 10*3/uL (ref 0.00–0.07)
Basophils Absolute: 0 10*3/uL (ref 0.0–0.1)
Basophils Relative: 1 %
Eosinophils Absolute: 0.3 10*3/uL (ref 0.0–0.5)
Eosinophils Relative: 5 %
HCT: 31.2 % — ABNORMAL LOW (ref 39.0–52.0)
Hemoglobin: 10.6 g/dL — ABNORMAL LOW (ref 13.0–17.0)
Immature Granulocytes: 1 %
Lymphocytes Relative: 26 %
Lymphs Abs: 1.7 10*3/uL (ref 0.7–4.0)
MCH: 28.3 pg (ref 26.0–34.0)
MCHC: 34 g/dL (ref 30.0–36.0)
MCV: 83.2 fL (ref 80.0–100.0)
Monocytes Absolute: 0.7 10*3/uL (ref 0.1–1.0)
Monocytes Relative: 11 %
Neutro Abs: 3.6 10*3/uL (ref 1.7–7.7)
Neutrophils Relative %: 56 %
Platelets: 176 10*3/uL (ref 150–400)
RBC: 3.75 MIL/uL — ABNORMAL LOW (ref 4.22–5.81)
RDW: 15.7 % — ABNORMAL HIGH (ref 11.5–15.5)
WBC: 6.4 10*3/uL (ref 4.0–10.5)
nRBC: 0 % (ref 0.0–0.2)

## 2021-12-08 MED ORDER — SODIUM CHLORIDE 0.9 % IV SOLN
Freq: Once | INTRAVENOUS | Status: AC
  Filled 2021-12-08: qty 250

## 2021-12-08 MED ORDER — HEPARIN SOD (PORK) LOCK FLUSH 100 UNIT/ML IV SOLN
500.0000 [IU] | Freq: Once | INTRAVENOUS | Status: AC
  Administered 2021-12-08: 500 [IU] via INTRAVENOUS
  Filled 2021-12-08: qty 5

## 2021-12-08 MED ORDER — SODIUM CHLORIDE 0.9% FLUSH
10.0000 mL | Freq: Once | INTRAVENOUS | Status: AC
  Administered 2021-12-08: 10 mL via INTRAVENOUS
  Filled 2021-12-08: qty 10

## 2021-12-08 MED ORDER — LISINOPRIL 5 MG PO TABS: 5.0000 mg | ORAL_TABLET | Freq: Every day | ORAL | 0 refills | Status: DC

## 2021-12-08 NOTE — Progress Notes (Signed)
Patient here for oncology follow-up appointment, expresses no new concerns since hospital visit

## 2021-12-08 NOTE — Progress Notes (Deleted)
Patient here for oncology follow-up appointment, expresses no new concerns since hospital visit

## 2021-12-08 NOTE — Progress Notes (Signed)
[facility] CONSULT NOTE  Patient Care Team: [name], PA as PCP - General (Physician Assistant) Telford Nab, RN as Registered Nurse Clent Jacks, RN as Oncology Nurse Navigator Cammie Sickle, MD as Consulting Physician (Oncology)  CHIEF COMPLAINTS/PURPOSE OF CONSULTATION: Lung cancer/colon cancer   Oncology History Overview Note  # July 2020- SMALL CELL CA METASTATIC TO MEDIASTINAL LN [open Bx; Dr.Oaks]; TxN2M0; July 2nd 2020-PET- 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  MRI brain negative  # aug 17th 2020-carbo etoposide-RT [? 8/19]; #4 cycle carbo-Etop [finished dec 30th,2020]  #August 2020 iron deficient anemia-question etiology; IV Feraheme [no colonoscopy]  # OCT 2020- colo/ Cecal adeno ca; MRI liver- 1 cm enhancing lesion "metastasis" [Dr.Anna/Dr.Cannon]-difficulty biopsy.  November 02-2019 right hemicolectomy- STAGE III [pT3pN1 (2/13LN)]- HOLD adjuvant therapy. MMR- INTACT/LOW  #May 2021-liver biopsy-possible adenocarcinoma; colorectal origin.  Stage IV colon cancer  #May 24-2021-FOLFOX with Avastin; AUG 31st,2021- STABLE liver lesion; SEP 1st, 2021- Discontinue LV+Ox sec to severe fatigue/PN; cont 5FU CIV + Avastin  # COPD/  DM-2- on OHA/ smoker/ PVD/peripheral neuropathy  # History of alcohol abuse/quit 2014/ Hx of abdominal trauma [at 21y]  #July 2021-foundation 1 NGS [liver metastases]- RAS WILD TYPE **  # Jan 1st, 2022-Covid infection [prior vaccination; monoclonal infusion-not hospitalized]  # DIAGNOSIS:   # SMALL CELL CA-limited stage-status post chemoradiation;   # colon cancer-stage IV-solitary liver lesion  GOALS: Control  CURRENT/MOST RECENT THERAPY : FOLFOX with Avastin   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 ([facility])  01/29/2019 Initial Diagnosis   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 [facility])   02/09/2019 - 06/24/2019  Chemotherapy   The patient had palonosetron (ALOXI) injection 0.25 mg, 0.25 mg, Intravenous,  Once, 4 of 4 cycles Administration: 0.25 mg (02/09/2019), 0.25 mg (03/03/2019), 0.25 mg (06/02/2019), 0.25 mg (06/22/2019) CARBOplatin (PARAPLATIN) 410 mg in sodium chloride 0.9 % 250 mL chemo infusion, 410 mg (100 % of original dose 414 mg), Intravenous,  Once, 4 of 4 cycles Dose modification:   (original dose 414 mg, Cycle 1) Administration: 410 mg (02/09/2019), 410 mg (03/03/2019), 410 mg (06/02/2019), 410 mg (06/22/2019) etoposide (VEPESID) 180 mg in sodium chloride 0.9 % 500 mL chemo infusion, 100 mg/m2 = 180 mg, Intravenous,  Once, 4 of 4 cycles Administration: 180 mg (02/09/2019), 180 mg (02/10/2019), 180 mg (02/11/2019), 180 mg (03/03/2019), 180 mg (03/04/2019), 180 mg (03/05/2019), 180 mg (06/02/2019), 180 mg (06/03/2019), 180 mg (06/04/2019), 180 mg (06/22/2019), 180 mg (06/23/2019), 180 mg (06/24/2019) fosaprepitant (EMEND) 150 mg, dexamethasone (DECADRON) 6 mg in sodium chloride 0.9 % 145 mL IVPB, , Intravenous,  Once, 2 of 2 cycles Administration:  (06/02/2019),  (06/22/2019)  for chemotherapy treatment.    Cancer of right colon ([place])  05/04/2019 Initial Diagnosis   Cancer of ascending colon ([place])   11/30/2019 -  Chemotherapy    Patient is on Treatment Plan: COLORECTAL FOLFOX + BEVACIZUMAB Q14D       HISTORY OF PRESENTING ILLNESS: Accompanied by his wife.  Ambulating independently.  Kevin Mckay 71 y.o.  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  In the interim patient was admitted to the hospital for abdominal pain rectal bleeding status post EGD/colonoscopy.  No etiology for acute bleeding noted.  Patient also had CT scan abdomen pelvis-for concerns for mesenteric ischemia.  INTERVAL History: Today patient states that he is  feeling much better than he was previously. Since stopping his BP as suggested by Dr. Jacinto Reap at his last visit for hypotension (80/40) and presyncopal episodes he has not had any presyncopal episodes, has more energy and overall feels better. Abdominal pain has resolved. Appetite is still low but he is drinking water and other fluids well. He had an appointment with Dr. Lucky Cowboy on 06/14 at which time a angiogram was scheduled for 12/18/2021. He has an upcoming PET scan on 12/14/2021 and a follow up with Dr. Jacinto Reap on 12/21/2021.   Continued weight loss.   Review of Systems  Constitutional:  Positive for weight loss. Negative for chills, diaphoresis, fever and malaise/fatigue.  HENT:  Negative for nosebleeds and sore throat.   Eyes:  Negative for double vision.  Respiratory:  Negative for hemoptysis and wheezing.   Cardiovascular:  Negative for chest pain, palpitations and orthopnea.  Gastrointestinal:  Negative for abdominal pain, blood in stool, constipation, diarrhea, heartburn, melena, nausea and vomiting.  Genitourinary:  Negative for dysuria, frequency and urgency.  Musculoskeletal:  Positive for back pain and joint pain.  Skin: Negative.  Negative for itching and rash.  Neurological:  Positive for tingling. Negative for dizziness, focal weakness and weakness.  Endo/Heme/Allergies:  Does not bruise/bleed easily.  Psychiatric/Behavioral:  Negative for depression. The patient is not nervous/anxious and does not have insomnia.      MEDICAL HISTORY:  Past Medical History:  Diagnosis Date   Asthma    Cancer ([place])    Metastatic small cell lung cancer   Cerebral aneurysm    Chronic painful diabetic neuropathy ([facility])    Depression    Diabetes mellitus without complication ([place])    Essential hypertension    History of kidney stones    Occasional tremors    Tobacco use     SURGICAL HISTORY: Past Surgical History:  Procedure Laterality Date   ABDOMINAL SURGERY     age 11. trauma surgery due to  forklift injury- liver and spleen   COLONOSCOPY WITH PROPOFOL N/A 03/27/2019   Procedure: COLONOSCOPY WITH PROPOFOL;  Surgeon: Jonathon Bellows, MD;  Location: [facility] ENDOSCOPY;  Service: Gastroenterology;  Laterality: N/A;   COLONOSCOPY WITH PROPOFOL N/A 12/02/2021   Procedure: COLONOSCOPY WITH PROPOFOL;  Surgeon: Lesly Rubenstein, MD;  Location: [facility] ENDOSCOPY;  Service: Endoscopy;  Laterality: N/A;   COLOSTOMY REVISION Right 05/04/2019   Procedure: COLON RESECTION RIGHT-right hemicolectomy-open;  Surgeon: Fredirick Maudlin, MD;  Location: [facility] ORS;  Service: General;  Laterality: Right;   ESOPHAGOGASTRODUODENOSCOPY N/A 12/02/2021   Procedure: ESOPHAGOGASTRODUODENOSCOPY (EGD);  Surgeon: Lesly Rubenstein, MD;  Location[facility] ENDOSCOPY;  Service: Endoscopy;  Laterality: N/A;   ESOPHAGOGASTRODUODENOSCOPY (EGD) WITH PROPOFOL N/A 03/27/2019   Procedure: ESOPHAGOGASTRODUODENOSCOPY (EGD) WITH PROPOFOL;  Surgeon: Jonathon Bellows, MD;  Location: [facility] ENDOSCOPY;  Service: Gastroenterology;  Laterality: N/A;   IR GENERIC HISTORICAL  05/31/2016   IR ANGIO VERTEBRAL SEL VERTEBRAL BILAT MOD SED 05/31/2016 Consuella Lose, MD [facility]-INTERV RAD   IR GENERIC HISTORICAL  05/31/2016   IR ANGIO INTRA EXTRACRAN SEL INTERNAL CAROTID BILAT MOD SED 05/31/2016 Consuella Lose, MD [facility]   PORTACATH PLACEMENT Right 02/04/2019   Procedure: INSERTION PORT-A-CATH;  Surgeon: [name], MD;  Location: [facility] ORS;  Service: General;  Laterality: Right;   THORACOTOMY Left 01/22/2019   Procedure: PRE OP BRONCH LEFT ANTERIOR THORACOTOMY WITH BIOPSY OF MEDIASTINAL MASS;  Surgeon: [name], MD;  Location: [facility] ORS;  Service: General;  Laterality: Left;  SOCIAL HISTORY: Social History   Socioeconomic History   Marital status: Married    Spouse name: Not on file   Number of children: Not on file   Years of education: Not on file   Highest education level: Not on file  Occupational History   Not on file  Tobacco Use    Smoking status: Every Day    Packs/day: 0.50    Years: 50.00    Total pack years: 25.00    Types: Cigarettes   Smokeless tobacco: Never  Vaping Use   Vaping Use: Never used  Substance and Sexual Activity   Alcohol use: No   Drug use: Yes    Types: Barbituates, Marijuana   Sexual activity: Not Currently  Other Topics Concern   Not on file  Social History Narrative   Lives in [place]; wife/ son/grandson [custody]; smoker; vending business; hx of alcoholism- quit at 60.    Social Determinants of Health   Financial Resource Strain: Low Risk  (05/04/2019)   Overall Financial Resource Strain (CARDIA)    Difficulty of Paying Living Expenses: Not hard at all  Food Insecurity: No Food Insecurity (05/04/2019)   Hunger Vital Sign    Worried About Running Out of Food in the Last Year: Never true    Ran Out of Food in the Last Year: Never true  Transportation Needs: No Transportation Needs (05/04/2019)   PRAPARE - Hydrologist (Medical): No    Lack of Transportation (Non-Medical): No  Physical Activity: Inactive (05/04/2019)   Exercise Vital Sign    Days of Exercise per Week: 0 days    Minutes of Exercise per Session: 0 min  Stress: No Stress Concern Present (05/04/2019)   [place]    Feeling of Stress : Not at all  Social Connections: Unknown (05/04/2019)   Social Connection and Isolation Panel [NHANES]    Frequency of Communication with Friends and Family: Patient refused    Frequency of Social Gatherings with Friends and Family: Patient refused    Attends Religious Services: Patient refused    Active Member of Clubs or Organizations: Patient refused    Attends Archivist Meetings: Patient refused    Marital Status: Patient refused  Intimate Partner Violence: Not At Risk (05/04/2019)   Humiliation, Afraid, Rape, and Kick questionnaire    Fear of Current or Ex-Partner: No     Emotionally Abused: No    Physically Abused: No    Sexually Abused: No    FAMILY HISTORY: Family History  Problem Relation Age of Onset   Lymphoma Father    Liver cancer Paternal Uncle    Lung cancer Paternal Uncle    Lung cancer Maternal Uncle     ALLERGIES:  is allergic to ferumoxytol, amoxicillin, and codeine.  MEDICATIONS:  Current Outpatient Medications  Medication Sig Dispense Refill   budesonide-formoterol (SYMBICORT) 160-4.5 MCG/ACT inhaler Inhale 2 puffs into the lungs 2 (two) times daily.     clopidogrel (PLAVIX) 75 MG tablet Take 1 tablet (75 mg total) by mouth daily. 30 tablet 6   DULoxetine (CYMBALTA) 30 MG capsule Take 90 mg by mouth every morning.     gabapentin (NEURONTIN) 800 MG tablet Take 800 mg by mouth 3 (three) times daily.     insulin glargine (LANTUS) 100 UNIT/ML injection Inject 10 Units into the skin 2 (two) times daily. Morning and Evening if Blood Sugar is over 150  Ipratropium-Albuterol (COMBIVENT) 20-100 MCG/ACT AERS respimat Inhale 1 puff into the lungs every 6 (six) hours as needed.     metFORMIN (GLUCOPHAGE) 1000 MG tablet Take 500 mg by mouth 2 (two) times daily with a meal.     prazosin (MINIPRESS) 1 MG capsule Take 1 mg by mouth at bedtime.     pyridOXINE (VITAMIN B-6) 100 MG tablet Take 100 mg by mouth daily.     sitaGLIPtin (JANUVIA) 25 MG tablet Take 25 mg by mouth daily.     sodium chloride 1 g tablet Take 1 g by mouth 3 (three) times daily with meals.     amLODipine (NORVASC) 10 MG tablet Take 10 mg by mouth every morning.  (Patient not taking: Reported on 12/06/2021)     lidocaine-prilocaine (EMLA) cream Apply 1 application. topically as needed. 30-45 mins prior to port access. (Patient not taking: Reported on 12/06/2021) 30 g 0   lisinopril-hydrochlorothiazide (ZESTORETIC) 10-12.5 MG tablet Take 1 tablet by mouth daily. (Patient not taking: Reported on 12/06/2021)     No current facility-administered medications for this visit.    Facility-Administered Medications Ordered in Other Visits  Medication Dose Route Frequency Provider Last Rate Last Admin   0.9 %  sodium chloride infusion   Intravenous Once Cammie Sickle, MD 999 mL/hr at 12/08/21 0905 New Bag at 12/08/21 0905   heparin lock flush 100 UNIT/ML injection            heparin lock flush 100 UNIT/ML injection            sodium chloride flush (NS) 0.9 % injection 10 mL  10 mL Intravenous PRN Cammie Sickle, MD   10 mL at 09/13/20 0943   sodium chloride flush (NS) 0.9 % injection 10 mL  10 mL Intravenous PRN Cammie Sickle, MD   10 mL at 07/13/21 0948      .  PHYSICAL EXAMINATION: ECOG PERFORMANCE STATUS: 0 - Asymptomatic  Vitals:   12/08/21 0924  BP: (!) 175/71  Pulse: 61  Resp: 16  Temp: (!) 96 F (35.6 C)  SpO2: 99%   There were no vitals filed for this visit.   Physical Exam HENT:     Head: Normocephalic and atraumatic.     Mouth/Throat:     Pharynx: No oropharyngeal exudate.  Eyes:     Pupils: Pupils are equal, round, and reactive to light.  Cardiovascular:     Rate and Rhythm: Normal rate and regular rhythm.  Pulmonary:     Effort: No respiratory distress.     Breath sounds: No wheezing.  Abdominal:     General: Bowel sounds are normal. There is no distension.     Palpations: Abdomen is soft. There is no mass.     Tenderness: There is no abdominal tenderness. There is no guarding or rebound.  Musculoskeletal:        General: No tenderness. Normal range of motion.     Cervical back: Normal range of motion and neck supple.  Skin:    General: Skin is warm.  Neurological:     Mental Status: He is alert and oriented to person, place, and time.  Psychiatric:        Mood and Affect: Affect normal.     LABORATORY DATA:  I have reviewed the data as listed Lab Results  Component Value Date   WBC 6.4 12/08/2021   HGB 10.6 (L) 12/08/2021   HCT 31.2 (L) 12/08/2021   MCV 83.2 12/08/2021  PLT 176 12/08/2021    Recent Labs    11/30/21 1320 12/01/21 0641 12/02/21 0614 12/08/21 0853  NA 133* 138 137 133*  K 4.8 4.2 3.8 4.2  CL 103 108 107 98  CO2 _0 GLUCOSE 249* 141* 152* 210*  BUN _1 CREATININE 1.11 0.98 0.62 0.88  CALCIUM 9.9 9.1 8.8* 9.0  GFRNONAA >60 >60 >60 >60  PROT 7.5 6.8  --  6.9  ALBUMIN 4.1 3.7  --  3.5  AST 18 21  --  14*  ALT 16 14  --  14  ALKPHOS 57 49  --  53  BILITOT 0.6 0.6  --  0.5    RADIOGRAPHIC STUDIES: I have personally reviewed the radiological images as listed and agreed with the findings in the report. US Carotid Bilateral  Result Date: 12/02/2021 CLINICAL DATA:  Hypertension, syncope and dizziness EXAM: BILATERAL CAROTID DUPLEX ULTRASOUND TECHNIQUE: Pearline Cables scale imaging, color Doppler and duplex ultrasound were performed of bilateral carotid and vertebral arteries in the neck. COMPARISON:  None Available. FINDINGS: Criteria: Quantification of carotid stenosis is based on velocity parameters that correlate the residual internal carotid diameter with NASCET-based stenosis levels, using the diameter of the distal internal carotid lumen as the denominator for stenosis measurement. The following velocity measurements were obtained: RIGHT ICA: 129/23 cm/sec CCA: 024/09 cm/sec SYSTOLIC ICA/CCA RATIO:  1.0 ECA: 97 cm/sec LEFT ICA: 112/30 cm/sec CCA: 735/32 cm/sec SYSTOLIC ICA/CCA RATIO:  0.9 ECA: 93 cm/sec RIGHT CAROTID ARTERY: Calcified bifurcation atherosclerosis. Despite this, no significant stenosis, velocity elevation, turbulent flow. No significant waveform abnormality. Degree of narrowing estimated less than 50% by ultrasound criteria. RIGHT VERTEBRAL ARTERY:  Normal antegrade flow LEFT CAROTID ARTERY: Slightly less calcified bifurcation atherosclerosis. Negative for significant stenosis, velocity elevation, turbulent flow. No significant waveform abnormality. Degree of narrowing also less than 50% by ultrasound criteria. LEFT VERTEBRAL ARTERY:   Normal antegrade flow IMPRESSION: Bilateral carotid atherosclerosis. Negative for stenosis. Degree of narrowing less than 50% bilaterally by ultrasound criteria. Patent antegrade vertebral flow bilaterally Electronically Signed   By: Jerilynn Mages.  Shick M.D.   On: 12/02/2021 13:56   CT Angio Abd/Pel w/ and/or w/o  Result Date: 12/02/2021 CLINICAL DATA:  Abdominal pain and rectal bleeding. Concern for mesenteric ischemia. EXAM: CTA ABDOMEN AND PELVIS WITHOUT AND WITH CONTRAST TECHNIQUE: Multidetector CT imaging of the abdomen and pelvis was performed using the standard protocol during bolus administration of intravenous contrast. Multiplanar reconstructed images and MIPs were obtained and reviewed to evaluate the vascular anatomy. RADIATION DOSE REDUCTION: This exam was performed according to the departmental dose-optimization program which includes automated exposure control, adjustment of the mA and/or kV according to patient size and/or use of iterative reconstruction technique. CONTRAST:  147m OMNIPAQUE IOHEXOL 350 MG/ML SOLN COMPARISON:  CT AP 11/30/2021 FINDINGS: VASCULAR Aorta: Normal caliber aorta without aneurysm, dissection, vasculitis or significant stenosis. Extensive aortic atherosclerotic calcifications. Celiac: Patent without evidence of aneurysm, dissection, vasculitis or significant stenosis. SMA: Patent without evidence of aneurysm, dissection, vasculitis or significant stenosis. Renals: Both renal arteries are patent without evidence of aneurysm, dissection, vasculitis, fibromuscular dysplasia or significant stenosis. There is a short segment IMA: High-grade, nonocclusive luminal narrowing secondary to calcified plaque within the proximal IMA, image 96/5. A second area of high-grade, nonocclusive luminal narrowing is noted just beyond the bifurcation of the IMA, image 111/5. The branches of the IMA appear well opacified beyond this area of stenosis. Inflow: Patent without evidence of aneurysm,  dissection,  vasculitis or significant stenosis. Proximal Outflow: Atherosclerotic calcifications involve bilateral common iliac arteries and their branches without occlusion. Veins: Extensive left upper quadrant varicosities are identified. Review of the MIP images confirms the above findings. NON-VASCULAR Lower chest: No acute abnormality. Hepatobiliary: Unchanged appearance of previously characterized subcapsular metastasis within the periphery of the right hepatic lobe measuring 1.6 x 0.9 cm, image 31/12. No new focal liver abnormalities. Gallbladder is unremarkable. Pancreas: On the arterial phase images there is hypoenhancement involving the neck of the pancreas measuring 3.0 x 2.8 by 2.4 cm. Additionally, there is focal luminal narrowing of the portal vein just beyond the portal venous confluence, image 52/2 which when compared with remote study from 06/07/2020 is a new finding. This is moderately suspicious for underlying neoplastic process involving the pancreas. Body and tail of pancreas are chronically atrophic or congenitally absent. Spleen: Normal in size without focal abnormality. Adrenals/Urinary Tract: Normal appearance of the adrenal glands. No suspicious kidney mass or hydronephrosis identified. Urinary bladder appears normal. Stomach/Bowel: The stomach appears within normal limits. Postoperative changes from previous right hemicolectomy. Enterocolonic anastomosis has been performed in the right hemiabdomen. The colon appears diffusely decompressed. No significant bowel wall thickening or inflammation. No pathologic dilatation of the abdomen or pelvis. Sigmoid diverticulosis noted without signs of acute diverticulitis. No signs of arterial phase intraluminal contrast of extravasation of intravenous contrast material to suggest active bleeding. On the delayed phase imaging there is no pooling of intravenous contrast material within the large or small bowel loops. Lymphatic: No signs abdominopelvic  adenopathy identified. Reproductive: Prostate gland appears enlarged. Other: No free fluid or fluid collections within the abdomen or pelvis. No signs of pneumoperitoneum. Musculoskeletal: Degenerative disc disease noted within the lumbar spine. IMPRESSION: 1. No signs of arterial phase intraluminal contrast extravasation of intravenous contrast material to suggest active bleeding. On the delayed phase imaging there is no pooling of intravenous contrast material within the large or small bowel loops. 2. High-grade, nonocclusive luminal narrowing of the proximal IMA. A second focus of high-grade luminal narrowing due to calcified plaque is noted within a proximal branch of the IMA to the rectum. The distal branches of the IMA appear well opacified beyond these areas of stenosis. 3. There is a subtle area hypoenhancement involving the neck of pancreas which results in focal luminal narrowing of the portal vein just beyond the portal venous confluence. This is a new finding from 11/30/2021 and is moderately suspicious. Underlying neoplastic process cannot be excluded. When the patient is clinically stable, is able to suspend respirations and remain motionless consider more definitive characterization with contrast enhanced liver protocol MRI. Alternatively, a restaging PET-CT may be helpful to assess for abnormal hypermetabolism within this area in this patient known liver metastasis. 4. Stable appearance of subcapsular metastasis within the periphery of the right hepatic lobe. 5. Status post right hemicolectomy with ileocolic anastomosis. 6. Sigmoid diverticulosis without signs of acute diverticulitis. 7. Enlarged prostate gland. 8. Aortic Atherosclerosis (ICD10-I70.0). Electronically Signed   By: Kerby Moors M.D.   On: 12/02/2021 12:40   CT ABDOMEN PELVIS W CONTRAST  Result Date: 11/30/2021 CLINICAL DATA:  Abdominal pain, acute, nonlocalized. two episodes of dark tarry stools this week. Previously treated for  colon cancer. Also reports dizziness, and indigestion. Reports poor appetite for the last three days due to nausea and epigastric pain. EXAM: CT ABDOMEN AND PELVIS WITH CONTRAST TECHNIQUE: Multidetector CT imaging of the abdomen and pelvis was performed using the standard protocol following bolus administration of intravenous  contrast. RADIATION DOSE REDUCTION: This exam was performed according to the departmental dose-optimization program which includes automated exposure control, adjustment of the mA and/or kV according to patient size and/or use of iterative reconstruction technique. CONTRAST:  115m OMNIPAQUE IOHEXOL 300 MG/ML  SOLN COMPARISON:  CT abdomen pelvis 12/07/2020 FINDINGS: Lower chest: Similar-appearing likely distal esophageal wall lipomatous lesion (2:9). No acute abnormality. Hepatobiliary: Vague peripheral right hepatic lobe hypodensity (2:11) consistent with a biopsy-proven metastasis. No gallstones, gallbladder wall thickening, or pericholecystic fluid. No biliary dilatation. Pancreas: No focal lesion. Normal pancreatic contour. No surrounding inflammatory changes. No main pancreatic ductal dilatation. Spleen: Normal in size without focal abnormality. Adrenals/Urinary Tract: No adrenal nodule bilaterally. Bilateral kidneys enhance symmetrically. A 1.2 cm fluid density lesion within left kidney statistically likely represents a simple renal cyst. Simple renal cysts, in the absence of clinically indicated signs/symptoms, require no independent follow-up. No hydronephrosis. No hydroureter. The urinary bladder is unremarkable. On delayed imaging, there is no urothelial wall thickening and there are no filling defects in the opacified portions of the bilateral collecting systems or ureters. Stomach/Bowel: Partial colectomy involving the right colon. Stomach is within normal limits. No evidence of bowel wall thickening or dilatation. Scattered colonic diverticulosis. Appendix appears normal.  Vascular/Lymphatic: The main portal, splenic, superior mesenteric veins are patent. Perigastric and left upper quadrant venous collaterals. No abdominal aorta or iliac aneurysm. Severe atherosclerotic plaque of the aorta and its branches. No abdominal, pelvic, or inguinal lymphadenopathy. Reproductive: Prostate is unremarkable. Other: No intraperitoneal free fluid. No intraperitoneal free gas. No organized fluid collection. Musculoskeletal: No abdominal wall hernia or abnormality. No suspicious lytic or blastic osseous lesions. No acute displaced fracture. L5-S1 intervertebral disc space vacuum phenomenon and endplate sclerosis as well as posterior disc osteophyte complex formation, osteophyte formation, facet arthropathy. IMPRESSION: 1. Perigastric and left upper quadrant venous collaterals. Limited evaluation for an active gastrointestinal hemorrhage on this portal venous study. 2. Status post partial colectomy with treated right hepatic metastasis. 3. Aortic Atherosclerosis (ICD10-I70.0) - severe. Electronically Signed   By: MIven FinnM.D.   On: 11/30/2021 17:23   CT HEAD WO CONTRAST (5MM)  Result Date: 11/30/2021 CLINICAL DATA:  Head trauma, minor (Age >= 65y) EXAM: CT HEAD WITHOUT CONTRAST TECHNIQUE: Contiguous axial images were obtained from the base of the skull through the vertex without intravenous contrast. RADIATION DOSE REDUCTION: This exam was performed according to the departmental dose-optimization program which includes automated exposure control, adjustment of the mA and/or kV according to patient size and/or use of iterative reconstruction technique. COMPARISON:  MRI head 02/04/2019 FINDINGS: Brain: No evidence of large-territorial acute infarction. No parenchymal hemorrhage. No mass lesion. No extra-axial collection. No mass effect or midline shift. No hydrocephalus. Basilar cisterns are patent. Vascular: No hyperdense vessel. Atherosclerotic calcifications are present within the  cavernous internal carotid and vertebral arteries. Skull: No acute fracture or focal lesion. Sinuses/Orbits: Paranasal sinuses and mastoid air cells are clear. The orbits are unremarkable. Other: None. IMPRESSION: No acute intracranial abnormality. Electronically Signed   By: MIven FinnM.D.   On: 11/30/2021 17:12   ECHOCARDIOGRAM COMPLETE  Result Date: 11/13/2021    ECHOCARDIOGRAM REPORT   Patient Name:   DCHRISTOPHR CALIXDate of Exam: 11/13/2021 Medical Rec #:  [id number]      Height:       68.0 in Accession #:    [id number]     Weight:       131.8 lb Date of Birth:  103-17-1952  BSA:          1.712 m Patient Age:    71 years        BP:           153/79 mmHg Patient Gender: M               HR:           66 bpm. Exam Location:  [facility] Procedure: 2D Echo, Cardiac Doppler and Color Doppler Indications:     R55 Syncope  History:         Patient has no prior history of Echocardiogram examinations.                  Asthma, Lung cancer, Colon cancer; Risk Factors:Current Smoker,                  Hypertension and Diabetes.  Sonographer:     Rosalia Hammers Referring Phys:  [id number] Cammie Sickle Diagnosing Phys: Kathlyn Sacramento MD  Sonographer Comments: Suboptimal parasternal window and suboptimal subcostal window. Image acquisition challenging due to patient body habitus and Image acquisition challenging due to respiratory motion. IMPRESSIONS  1. Left ventricular ejection fraction, by estimation, is 60 to 65%. The left ventricle has normal function. The left ventricle has no regional wall motion abnormalities. There is mild left ventricular hypertrophy. Left ventricular diastolic parameters were normal.  2. Right ventricular systolic function is normal. The right ventricular size is normal. Tricuspid regurgitation signal is inadequate for assessing PA pressure.  3. The mitral valve is normal in structure. No evidence of mitral valve regurgitation. No evidence of mitral stenosis.  4. The aortic valve is  normal in structure. Aortic valve regurgitation is not visualized. Aortic valve sclerosis/calcification is present, without any evidence of aortic stenosis.  5. The inferior vena cava is normal in size with greater than 50% respiratory variability, suggesting right atrial pressure of 3 mmHg. FINDINGS  Left Ventricle: Left ventricular ejection fraction, by estimation, is 60 to 65%. The left ventricle has normal function. The left ventricle has no regional wall motion abnormalities. The left ventricular internal cavity size was normal in size. There is  mild left ventricular hypertrophy. Left ventricular diastolic parameters were normal. Right Ventricle: The right ventricular size is normal. No increase in right ventricular wall thickness. Right ventricular systolic function is normal. Tricuspid regurgitation signal is inadequate for assessing PA pressure. Left Atrium: Left atrial size was normal in size. Right Atrium: Right atrial size was normal in size. Pericardium: There is no evidence of pericardial effusion. Mitral Valve: The mitral valve is normal in structure. No evidence of mitral valve regurgitation. No evidence of mitral valve stenosis. MV peak gradient, 5.1 mmHg. The mean mitral valve gradient is 2.0 mmHg. Tricuspid Valve: The tricuspid valve is normal in structure. Tricuspid valve regurgitation is not demonstrated. No evidence of tricuspid stenosis. Aortic Valve: The aortic valve is normal in structure. Aortic valve regurgitation is not visualized. Aortic valve sclerosis/calcification is present, without any evidence of aortic stenosis. Aortic valve mean gradient measures 3.0 mmHg. Aortic valve peak  gradient measures 4.7 mmHg. Aortic valve area, by VTI measures 2.73 cm. Pulmonic Valve: The pulmonic valve was normal in structure. Pulmonic valve regurgitation is not visualized. No evidence of pulmonic stenosis. Aorta: The aortic root is normal in size and structure. Venous: The inferior vena cava is  normal in size with greater than 50% respiratory variability, suggesting right atrial pressure of 3 mmHg. IAS/Shunts: No atrial level shunt detected  by color flow Doppler.  LEFT VENTRICLE PLAX 2D LVIDd:         3.99 cm   Diastology LVIDs:         2.18 cm   LV e' medial:    8.27 cm/s LV PW:         1.28 cm   LV E/e' medial:  14.1 LV IVS:        1.22 cm   LV e' lateral:   10.20 cm/s LVOT diam:     1.90 cm   LV E/e' lateral: 11.5 LV SV:         67 LV SV Index:   39 LVOT Area:     2.84 cm  RIGHT VENTRICLE RV Basal diam:  3.22 cm RV S prime:     13.50 cm/s LEFT ATRIUM             Index        RIGHT ATRIUM          Index LA diam:        2.90 cm 1.69 cm/m   RA Area:     8.91 cm LA Vol (A2C):   42.5 ml 24.83 ml/m  RA Volume:   14.60 ml 8.53 ml/m LA Vol (A4C):   28.9 ml 16.88 ml/m LA Biplane Vol: 36.2 ml 21.15 ml/m  AORTIC VALVE                    PULMONIC VALVE AV Area (Vmax):    2.99 cm     PV Vmax:       1.02 m/s AV Area (Vmean):   2.77 cm     PV Vmean:      66.600 cm/s AV Area (VTI):     2.73 cm     PV VTI:        0.207 m AV Vmax:           108.00 cm/s  PV Peak grad:  4.2 mmHg AV Vmean:          75.500 cm/s  PV Mean grad:  2.0 mmHg AV VTI:            0.247 m AV Peak Grad:      4.7 mmHg AV Mean Grad:      3.0 mmHg LVOT Vmax:         114.00 cm/s LVOT Vmean:        73.800 cm/s LVOT VTI:          0.238 m LVOT/AV VTI ratio: 0.96  AORTA Ao Root diam: 3.20 cm MITRAL VALVE MV Area (PHT): 4.19 cm     SHUNTS MV Area VTI:   1.76 cm     Systemic VTI:  0.24 m MV Peak grad:  5.1 mmHg     Systemic Diam: 1.90 cm MV Mean grad:  2.0 mmHg MV Vmax:       1.13 m/s MV Vmean:      67.3 cm/s MV Decel Time: 181 msec MV E velocity: 117.00 cm/s MV A velocity: 115.00 cm/s MV E/A ratio:  1.02 Kathlyn Sacramento MD Electronically signed by Kathlyn Sacramento MD Signature Date/Time: 11/13/2021/1:08:34 PM    Final    MR BRAIN W WO CONTRAST  Result Date: 11/08/2021 CLINICAL DATA:  Non-small-cell lung cancer, dizziness, headaches EXAM: MRI HEAD  WITHOUT AND WITH CONTRAST TECHNIQUE: Multiplanar, multiecho pulse sequences of the brain and surrounding structures were obtained without and with intravenous contrast. CONTRAST:  84m GADAVIST GADOBUTROL 1  MMOL/ML IV SOLN COMPARISON:  Brain MRI 02/04/2019 FINDINGS: Brain: There is no acute intracranial hemorrhage, extra-axial fluid collection, or acute infarct. Parenchymal volume is normal. The ventricles are normal in size. Gray-white differentiation is preserved. There are scattered small foci of FLAIR signal abnormality in the subcortical and periventricular white matter, nonspecific but likely reflecting sequela of minimal chronic white matter microangiopathy, not substantially changed since 2020 There is no suspicious parenchymal signal abnormality. There is no mass lesion. There is no abnormal enhancement. There is no mass effect or midline shift. Vascular: Normal flow voids. Skull and upper cervical spine: Normal marrow signal. Sinuses/Orbits: There is mild mucosal thickening in the paranasal sinuses. The globes and orbits are unremarkable. Other: There are bilateral mastoid effusions, unchanged on the right but new on the left. The imaged nasopharynx is unremarkable. IMPRESSION: 1. No evidence of intracranial metastatic disease or other acute intracranial pathology. 2. Bilateral mastoid effusions, unchanged on the right but new on the left. Electronically Signed   By: Valetta Mole M.D.   On: 11/08/2021 12:54    ASSESSMENT & PLAN:   No problem-specific Assessment & Plan notes found for this encounter.  Encounter Diagnoses  Name Primary?   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 BP is elevated today above target range.  Repeating BP after fluids in clinic. If BP is above 584 systolic I would recommend 5 mg lisinopril once daily. Continue fluids and food as tolerated. Keep specialist appointments and imaging as scheduled. Follow up with Dr. Jacinto Reap on 12/21/2021.   All questions were answered. The patient knows to call the clinic with any problems, questions or concerns.    Hughie Closs, PA-C 12/08/2021 10:00 AM

## 2021-12-09 LAB — CEA: CEA: 8.2 ng/mL — ABNORMAL HIGH (ref 0.0–4.7)

## 2021-12-09 LAB — CANCER ANTIGEN 19-9: CA 19-9: 95 U/mL — ABNORMAL HIGH (ref 0–35)

## 2021-12-14 ENCOUNTER — Telehealth (INDEPENDENT_AMBULATORY_CARE_PROVIDER_SITE_OTHER): Payer: Self-pay

## 2021-12-14 ENCOUNTER — Ambulatory Visit: Admission: RE | Admit: 2021-12-14 | Payer: BC Managed Care – PPO | Source: Ambulatory Visit

## 2021-12-14 ENCOUNTER — Ambulatory Visit
Admission: RE | Admit: 2021-12-14 | Discharge: 2021-12-14 | Disposition: A | Discharge status: Home / Self Care | Payer: BC Managed Care – PPO | Source: Ambulatory Visit | Attending: Internal Medicine | Admitting: Internal Medicine

## 2021-12-14 DIAGNOSIS — C3492 Malignant neoplasm of unspecified part of left bronchus or lung: Secondary | ICD-10-CM | POA: Diagnosis present

## 2021-12-14 DIAGNOSIS — I700 Atherosclerosis of aorta: Secondary | ICD-10-CM | POA: Diagnosis not present

## 2021-12-14 DIAGNOSIS — K8689 Other specified diseases of pancreas: Secondary | ICD-10-CM | POA: Insufficient documentation

## 2021-12-14 DIAGNOSIS — K5730 Diverticulosis of large intestine without perforation or abscess without bleeding: Secondary | ICD-10-CM | POA: Insufficient documentation

## 2021-12-14 DIAGNOSIS — C781 Secondary malignant neoplasm of mediastinum: Secondary | ICD-10-CM | POA: Diagnosis not present

## 2021-12-14 DIAGNOSIS — E119 Type 2 diabetes mellitus without complications: Secondary | ICD-10-CM | POA: Diagnosis not present

## 2021-12-14 DIAGNOSIS — Z85038 Personal history of other malignant neoplasm of large intestine: Secondary | ICD-10-CM | POA: Insufficient documentation

## 2021-12-14 DIAGNOSIS — I2510 Atherosclerotic heart disease of native coronary artery without angina pectoris: Secondary | ICD-10-CM | POA: Diagnosis not present

## 2021-12-14 DIAGNOSIS — C182 Malignant neoplasm of ascending colon: Secondary | ICD-10-CM

## 2021-12-14 DIAGNOSIS — Z794 Long term (current) use of insulin: Secondary | ICD-10-CM | POA: Diagnosis not present

## 2021-12-14 DIAGNOSIS — C801 Malignant (primary) neoplasm, unspecified: Secondary | ICD-10-CM

## 2021-12-14 DIAGNOSIS — J9859 Other diseases of mediastinum, not elsewhere classified: Secondary | ICD-10-CM

## 2021-12-14 LAB — GLUCOSE, CAPILLARY: Glucose-Capillary: 197 mg/dL — ABNORMAL HIGH (ref 70–99)

## 2021-12-14 MED ORDER — FLUDEOXYGLUCOSE F - 18 (FDG) INJECTION
7.0000 | Freq: Once | INTRAVENOUS | Status: AC | PRN
  Administered 2021-12-14: 6.64 via INTRAVENOUS

## 2021-12-14 NOTE — Telephone Encounter (Signed)
Spoke with the patient's spouse and the patient is scheduled with Dr. Lucky Cowboy for a mesenteric angio on 12/18/21 with a 8:15 am arrival time to the [facility]. Pre-procedure instructions were discussed and the spouse stated she wrote them down.

## 2021-12-15 ENCOUNTER — Telehealth: Payer: Self-pay | Admitting: Internal Medicine

## 2021-12-17 ENCOUNTER — Telehealth: Payer: Self-pay | Admitting: Internal Medicine

## 2021-12-17 DIAGNOSIS — K8689 Other specified diseases of pancreas: Secondary | ICD-10-CM

## 2021-12-17 DIAGNOSIS — C182 Malignant neoplasm of ascending colon: Secondary | ICD-10-CM

## 2021-12-18 ENCOUNTER — Other Ambulatory Visit: Payer: Self-pay

## 2021-12-18 ENCOUNTER — Telehealth (INDEPENDENT_AMBULATORY_CARE_PROVIDER_SITE_OTHER): Payer: Self-pay

## 2021-12-18 ENCOUNTER — Ambulatory Visit
Admission: RE | Admit: 2021-12-18 | Discharge: 2021-12-18 | Disposition: A | Discharge status: Home / Self Care | Payer: BC Managed Care – PPO | Attending: Vascular Surgery | Admitting: Vascular Surgery

## 2021-12-18 ENCOUNTER — Encounter: Payer: Self-pay | Admitting: Anesthesiology

## 2021-12-18 ENCOUNTER — Encounter: Payer: Self-pay | Admitting: Vascular Surgery

## 2021-12-18 ENCOUNTER — Encounter
Admission: RE | Disposition: A | Discharge status: Home / Self Care | Payer: Self-pay | Source: Home / Self Care | Attending: Vascular Surgery

## 2021-12-18 DIAGNOSIS — Z794 Long term (current) use of insulin: Secondary | ICD-10-CM | POA: Diagnosis not present

## 2021-12-18 DIAGNOSIS — I10 Essential (primary) hypertension: Secondary | ICD-10-CM | POA: Diagnosis not present

## 2021-12-18 DIAGNOSIS — E1140 Type 2 diabetes mellitus with diabetic neuropathy, unspecified: Secondary | ICD-10-CM | POA: Insufficient documentation

## 2021-12-18 DIAGNOSIS — K551 Chronic vascular disorders of intestine: Secondary | ICD-10-CM | POA: Diagnosis present

## 2021-12-18 DIAGNOSIS — Z7984 Long term (current) use of oral hypoglycemic drugs: Secondary | ICD-10-CM | POA: Diagnosis not present

## 2021-12-18 DIAGNOSIS — F17210 Nicotine dependence, cigarettes, uncomplicated: Secondary | ICD-10-CM | POA: Diagnosis not present

## 2021-12-18 HISTORY — PX: VISCERAL ANGIOGRAPHY: CATH118276

## 2021-12-18 LAB — GLUCOSE, CAPILLARY: Glucose-Capillary: 193 mg/dL — ABNORMAL HIGH (ref 70–99)

## 2021-12-18 SURGERY — VISCERAL ANGIOGRAPHY
Anesthesia: Moderate Sedation

## 2021-12-18 MED ORDER — DIPHENHYDRAMINE HCL 50 MG/ML IJ SOLN: 50.0000 mg | Freq: Once | INTRAMUSCULAR | Status: DC | PRN

## 2021-12-18 MED ORDER — FENTANYL CITRATE (PF) 100 MCG/2ML IJ SOLN
INTRAMUSCULAR | Status: DC | PRN
  Administered 2021-12-18: 50 ug via INTRAVENOUS
  Administered 2021-12-18: 25 ug via INTRAVENOUS

## 2021-12-18 MED ORDER — MIDAZOLAM HCL 2 MG/2ML IJ SOLN
INTRAMUSCULAR | Status: AC
  Filled 2021-12-18: qty 4

## 2021-12-18 MED ORDER — ASPIRIN 81 MG PO TBEC: 81.0000 mg | DELAYED_RELEASE_TABLET | Freq: Every day | ORAL | 2 refills | Status: AC

## 2021-12-18 MED ORDER — VANCOMYCIN HCL IN DEXTROSE 1-5 GM/200ML-% IV SOLN
1000.0000 mg | INTRAVENOUS | Status: AC
  Administered 2021-12-18: 1000 mg via INTRAVENOUS
  Filled 2021-12-18: qty 200

## 2021-12-18 MED ORDER — ATORVASTATIN CALCIUM 10 MG PO TABS: 10.0000 mg | ORAL_TABLET | Freq: Every day | ORAL | 11 refills | Status: AC

## 2021-12-18 MED ORDER — SODIUM CHLORIDE 0.9 % IV SOLN: INTRAVENOUS | Status: DC

## 2021-12-18 MED ORDER — HEPARIN SOD (PORK) LOCK FLUSH 100 UNIT/ML IV SOLN
INTRAVENOUS | Status: AC
  Filled 2021-12-18: qty 5

## 2021-12-18 MED ORDER — FENTANYL CITRATE PF 50 MCG/ML IJ SOSY
PREFILLED_SYRINGE | INTRAMUSCULAR | Status: AC
  Filled 2021-12-18: qty 2

## 2021-12-18 MED ORDER — FAMOTIDINE 20 MG PO TABS: 40.0000 mg | ORAL_TABLET | Freq: Once | ORAL | Status: DC | PRN

## 2021-12-18 MED ORDER — SODIUM CHLORIDE FLUSH 0.9 % IV SOLN
INTRAVENOUS | Status: AC
  Filled 2021-12-18: qty 10

## 2021-12-18 MED ORDER — IODIXANOL 320 MG/ML IV SOLN
INTRAVENOUS | Status: DC | PRN
  Administered 2021-12-18: 25 mL

## 2021-12-18 MED ORDER — MIDAZOLAM HCL 2 MG/ML PO SYRP: 8.0000 mg | ORAL_SOLUTION | Freq: Once | ORAL | Status: DC | PRN

## 2021-12-18 MED ORDER — METHYLPREDNISOLONE SODIUM SUCC 125 MG IJ SOLR: 125.0000 mg | Freq: Once | INTRAMUSCULAR | Status: DC | PRN

## 2021-12-18 MED ORDER — HEPARIN SODIUM (PORCINE) 1000 UNIT/ML IJ SOLN
INTRAMUSCULAR | Status: DC | PRN
  Administered 2021-12-18: 4000 [IU] via INTRAVENOUS

## 2021-12-18 MED ORDER — HYDROMORPHONE HCL 1 MG/ML IJ SOLN: 1.0000 mg | Freq: Once | INTRAMUSCULAR | Status: DC | PRN

## 2021-12-18 MED ORDER — HEPARIN SODIUM (PORCINE) 1000 UNIT/ML IJ SOLN
INTRAMUSCULAR | Status: AC
  Filled 2021-12-18: qty 10

## 2021-12-18 MED ORDER — MIDAZOLAM HCL 2 MG/2ML IJ SOLN
INTRAMUSCULAR | Status: DC | PRN
  Administered 2021-12-18: 1 mg via INTRAVENOUS
  Administered 2021-12-18: 2 mg via INTRAVENOUS

## 2021-12-18 MED ORDER — ONDANSETRON HCL 4 MG/2ML IJ SOLN
4.0000 mg | Freq: Four times a day (QID) | INTRAMUSCULAR | Status: DC | PRN
Start: 2021-12-18 — End: 2021-12-18

## 2021-12-18 SURGICAL SUPPLY — 17 items
BALLN LUTONIX  018 4X60X130 (BALLOONS) ×1
BALLN LUTONIX 018 4X60X130 (BALLOONS) ×1
BALLN LUTONIX DCB 4X60X130 (BALLOONS) ×2
BALLOON LUTONIX 018 4X60X130 (BALLOONS) IMPLANT
BALLOON LUTONIX DCB 4X60X130 (BALLOONS) IMPLANT
CATH ANGIO 5F PIGTAIL 65CM (CATHETERS) ×1 IMPLANT
CATH VS15FR (CATHETERS) ×1 IMPLANT
COVER PROBE U/S 5X48 (MISCELLANEOUS) ×1 IMPLANT
DEVICE STARCLOSE SE CLOSURE (Vascular Products) ×2 IMPLANT
GLIDEWIRE ADV .035X260CM (WIRE) ×1 IMPLANT
KIT ENCORE 26 ADVANTAGE (KITS) ×1 IMPLANT
PACK ANGIOGRAPHY (CUSTOM PROCEDURE TRAY) ×2 IMPLANT
SHEATH BRITE TIP 5FRX11 (SHEATH) ×2 IMPLANT
SYR MEDRAD MARK 7 150ML (SYRINGE) ×1 IMPLANT
TUBING CONTRAST HIGH PRESS 72 (TUBING) ×1 IMPLANT
WIRE G 018X200 V18 (WIRE) ×1 IMPLANT
WIRE GUIDERIGHT .035X150 (WIRE) ×1 IMPLANT

## 2021-12-18 NOTE — Op Note (Signed)
[facility]  Percutaneous Study/Intervention Procedural Note   Date: 12/18/2021  Surgeon(s): Festus Barren, MD  Assistants: none  Pre-operative Diagnosis: 1.  Colitis, chronic mesenteric ischemia 2.  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  Ultrasound guidance for vascular access bilateral femoral arteries             2.  Catheter placement into the IMA from left femoral approach             3.  Aortogram and selective angiogram of the inferior mesenteric artery             4.  PTA of the inferior mesenteric artery with 4 mm diameter Lutonix drug-coated angioplasty balloon             5.  StarClose closure device bilateral femoral arteries  Contrast: 25 cc  Fluoro time: 3.5 minutes  EBL: 10 cc  Anesthesia: Approximately 36 minutes of Moderate conscious sedation using 3 mg of Versed and 75 mcg of Fentanyl              Indications:  Patient is a 71 y.o. male who has symptoms consistent with mesenteric ischemia and previous colitis. The patient has a CT scan showing significant stenosis of the IMA. The patient is brought in for angiography for further evaluation and potential treatment. Risks and benefits are discussed and informed consent is obtained  Procedure:  The patient was identified and appropriate procedural time out was performed.  The patient was then placed supine on the table and prepped and draped in the usual sterile fashion. Moderate conscious sedation was administered during a face to face encounter with the patient throughout the procedure with my supervision of the RN administering medicines and monitoring the patient's vital signs, pulse oximetry, telemetry and mental status throughout from the start of the procedure until the patient was taken to the recovery room.  Ultrasound was used to evaluate the right common femoral artery.  It was patent .  A digital  ultrasound image was acquired.  A Seldinger needle was used to access the right common femoral artery under direct ultrasound guidance and a permanent image was performed.  A 0.035 J wire was advanced without resistance and a 5Fr sheath was placed.  However, the wire would not pass the right common iliac artery and on imaging through the right femoral sheath that was seen in the right common iliac artery was occluded.  I then turned my attention to the left side. Ultrasound was used to evaluate the right common femoral artery.  It was patent .  A digital ultrasound image was acquired.  A Seldinger needle was used to access the left common femoral artery under direct ultrasound guidance and a permanent image was performed.  A 0.035 J wire was advanced without resistance and a 5Fr sheath was placed.  Pigtail catheter was placed into the aorta and an aortogram was performed in an RAO projection. This demonstrated what appeared to be fairly normal renal arteries and good flow through both the celiac and SMA although the origins were not particularly well seen.  The right common iliac artery was occluded.  The left iliac artery had mild disease.  The IMA was a reasonable sized vessel and did appear to have some disease proximally.  Was then cannulated with a V S1 catheter without difficulty  and the patient was systemically heparinized.  Selective imaging showed about a 70% stenosis of the inferior mesenteric artery.  I then crossed the stenosis without difficulty first and an advantage wire and elected to proceed with balloon angioplasty.  The balloon however would not cross the lesion over the floppy portion of the advantage wire and so I exchanged for a V18 wire and a 0.0184 mm diameter by 6 cm length Lutonix drug-coated angioplasty balloon was inflated to 10 atm for 1 minute in the proximal IMA.  Completion imaging showed improvement with about a 30-40% residual stenosis that did not appear flow-limiting and a  relatively small vessel that would not be ideal for stent placement.  At this point, I elected to terminate the procedure. The diagnostic catheter was removed. StarClose closure device was deployed in usual fashion with excellent hemostatic result in both femoral artery. The patient was taken to the recovery room in stable condition having tolerated the procedure well.     Findings: Right common iliac artery occlusion.  Patent renal arteries and no obvious stenosis in the celiac or SMA.  IMA with about a 70% stenosis.  Disposition: Patient was taken to the recovery room in stable condition having tolerated the procedure well.  Complications:  None  Festus Barren 12/18/2021 11:21 AM   This note was created with Dragon Medical transcription system. Any errors in dictation are purely unintentional.

## 2021-12-21 ENCOUNTER — Telehealth (INDEPENDENT_AMBULATORY_CARE_PROVIDER_SITE_OTHER): Payer: Self-pay

## 2021-12-21 ENCOUNTER — Inpatient Hospital Stay: Payer: BC Managed Care – PPO

## 2021-12-21 ENCOUNTER — Encounter: Payer: Self-pay | Admitting: Internal Medicine

## 2021-12-21 ENCOUNTER — Telehealth: Payer: Self-pay

## 2021-12-21 ENCOUNTER — Other Ambulatory Visit: Payer: Self-pay | Admitting: Internal Medicine

## 2021-12-21 ENCOUNTER — Inpatient Hospital Stay: Payer: BC Managed Care – PPO | Admitting: Internal Medicine

## 2021-12-21 DIAGNOSIS — C182 Malignant neoplasm of ascending colon: Secondary | ICD-10-CM

## 2021-12-21 DIAGNOSIS — C184 Malignant neoplasm of transverse colon: Secondary | ICD-10-CM | POA: Diagnosis not present

## 2021-12-21 DIAGNOSIS — Z95828 Presence of other vascular implants and grafts: Secondary | ICD-10-CM

## 2021-12-21 LAB — COMPREHENSIVE METABOLIC PANEL
ALT: 17 U/L (ref 0–44)
AST: 20 U/L (ref 15–41)
Albumin: 3.7 g/dL (ref 3.5–5.0)
Alkaline Phosphatase: 59 U/L (ref 38–126)
Anion gap: 7 (ref 5–15)
BUN: 24 mg/dL — ABNORMAL HIGH (ref 8–23)
CO2: 25 mmol/L (ref 22–32)
Calcium: 8.9 mg/dL (ref 8.9–10.3)
Chloride: 99 mmol/L (ref 98–111)
Creatinine, Ser: 1.49 mg/dL — ABNORMAL HIGH (ref 0.61–1.24)
GFR, Estimated: 50 mL/min — ABNORMAL LOW (ref >60)
Glucose, Bld: 289 mg/dL — ABNORMAL HIGH (ref 70–99)
Potassium: 4.8 mmol/L (ref 3.5–5.1)
Sodium: 131 mmol/L — ABNORMAL LOW (ref 135–145)
Total Bilirubin: 0.5 mg/dL (ref 0.3–1.2)
Total Protein: 6.8 g/dL (ref 6.5–8.1)

## 2021-12-21 LAB — CBC WITH DIFFERENTIAL/PLATELET
Abs Immature Granulocytes: 0.02 10*3/uL (ref 0.00–0.07)
Basophils Absolute: 0.1 10*3/uL (ref 0.0–0.1)
Basophils Relative: 1 %
Eosinophils Absolute: 0.4 10*3/uL (ref 0.0–0.5)
Eosinophils Relative: 5 %
HCT: 31 % — ABNORMAL LOW (ref 39.0–52.0)
Hemoglobin: 10.3 g/dL — ABNORMAL LOW (ref 13.0–17.0)
Immature Granulocytes: 0 %
Lymphocytes Relative: 26 %
Lymphs Abs: 2.1 10*3/uL (ref 0.7–4.0)
MCH: 28.1 pg (ref 26.0–34.0)
MCHC: 33.2 g/dL (ref 30.0–36.0)
MCV: 84.5 fL (ref 80.0–100.0)
Monocytes Absolute: 0.7 10*3/uL (ref 0.1–1.0)
Monocytes Relative: 9 %
Neutro Abs: 4.6 10*3/uL (ref 1.7–7.7)
Neutrophils Relative %: 59 %
Platelets: 169 10*3/uL (ref 150–400)
RBC: 3.67 MIL/uL — ABNORMAL LOW (ref 4.22–5.81)
RDW: 15.6 % — ABNORMAL HIGH (ref 11.5–15.5)
WBC: 7.9 10*3/uL (ref 4.0–10.5)
nRBC: 0 % (ref 0.0–0.2)

## 2021-12-21 MED ORDER — SODIUM CHLORIDE 0.9% FLUSH
10.0000 mL | Freq: Once | INTRAVENOUS | Status: AC
  Administered 2021-12-21: 10 mL via INTRAVENOUS
  Filled 2021-12-21: qty 10

## 2021-12-21 MED ORDER — HEPARIN SOD (PORK) LOCK FLUSH 100 UNIT/ML IV SOLN
500.0000 [IU] | Freq: Once | INTRAVENOUS | Status: AC
  Administered 2021-12-21: 500 [IU] via INTRAVENOUS
  Filled 2021-12-21: qty 5

## 2021-12-21 MED ORDER — HYDROCODONE-ACETAMINOPHEN 5-325 MG PO TABS: 1.0000 | ORAL_TABLET | Freq: Two times a day (BID) | ORAL | 0 refills | Status: DC | PRN

## 2021-12-21 NOTE — Assessment & Plan Note (Addendum)
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  CT scan -JUNE 9th, 2023-no evidence of progression of subcapsular liver lesion/biopsy-proven colon cancer; no evidence of new disease; however subsequent CT done for mesenteric ischemia noted to have a peripancreatic mass [ hypoenhancement involving the neck of the pancreas measuring 3.0 x 2.8 by 2.4 cm].  December 18, 2021 PET scan:  Vague mild hypermetabolism (max SUV 3.0) in the pancreatic neck.  Recommend MRI with and without contrast as planned on July 1st for further evaluation.  Discussed the differential diagnosis includes-metastatic disease from small cell lung cancer/or colon cancer versus primary pancreatic cancer Patient understand that he will likely need endoscopic ultrasound for further evaluation.  CA 19-9 slightly elevated repeat CA 19-9; CEA at next visit.  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 STABLE.  #PAD/Abdominal pain-likely secondary to mesenteric ischemia-status post PTCA balloon angioplasty; continue aspirin and Plavix as per vascular.  # pre-Syncopal episodes: random lightheaded; slouch to ground; at least a mins [4-5 times day]; headaches;  MRI of the brain- MAY 2023- NEGATIVE for metastases.  Labile blood pressures monitor for now.  Continue hold blood pressures medications.  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   #Hyponatremia sodium 132- STABLE.  # Back pain- MRI lumbar spine- July 2021-NEG for mets; severe arthritic changes; spinal canal stenosis-stable hydrocodone 1 day prn - STABLE.   # Poorly controlled diabetes blood sugars-better controlled as per patients-overall stable FBG-288-continue  close follow-up with PCP.   #Prostatism symptoms: Continue Prazosin.   # DISPOSITION:  # follow up in in 2-3 weeks-MD; labs- cbc/cmp; ca-19-9;CEA;  possible IVFs over 1 hours  Cc; [facility]-

## 2021-12-21 NOTE — H&P (View-Only) (Signed)
[place] CONSULT NOTE  Patient Care Team: [name], PA as PCP - General (Physician Assistant) Telford Nab, RN as Registered Nurse Clent Jacks, RN as Oncology Nurse Navigator Cammie Sickle, MD as Consulting Physician (Oncology)  CHIEF COMPLAINTS/PURPOSE OF CONSULTATION: Lung cancer/colon cancer   Oncology History Overview Note  # July 2020- SMALL CELL CA METASTATIC TO MEDIASTINAL LN [open Bx; Dr.Oaks]; TxN2M0; July 2nd 2020-PET- 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  MRI brain negative  # aug 17th 2020-carbo etoposide-RT [? 8/19]; #4 cycle carbo-Etop [finished dec 30th,2020]  #August 2020 iron deficient anemia-question etiology; IV Feraheme [no colonoscopy]  # OCT 2020- colo/ Cecal adeno ca; MRI liver- 1 cm enhancing lesion "metastasis" [Dr.Anna/Dr.Cannon]-difficulty biopsy.  November 02-2019 right hemicolectomy- STAGE III [pT3pN1 (2/13LN)]- HOLD adjuvant therapy. MMR- INTACT/LOW  #May 2021-liver biopsy-possible adenocarcinoma; colorectal origin.  Stage IV colon cancer  #May 24-2021-FOLFOX with Avastin; AUG 31st,2021- STABLE liver lesion; SEP 1st, 2021- Discontinue LV+Ox sec to severe fatigue/PN; cont 5FU CIV + Avastin  # COPD/  DM-2- on OHA/ smoker/ PVD/peripheral neuropathy  # History of alcohol abuse/quit 2014/ Hx of abdominal trauma [at 21y]  #July 2021-foundation 1 NGS [liver metastases]- RAS WILD TYPE **  # Jan 1st, 2022-Covid infection [prior vaccination; monoclonal infusion-not hospitalized]  # DIAGNOSIS:   # SMALL CELL CA-limited stage-status post chemoradiation;   # colon cancer-stage IV-solitary liver lesion  GOALS: Control  CURRENT/MOST RECENT THERAPY : FOLFOX with Avastin   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 ([place])  01/29/2019 Initial Diagnosis   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 [facility])   02/09/2019 - 06/24/2019  Chemotherapy   The patient had palonosetron (ALOXI) injection 0.25 mg, 0.25 mg, Intravenous,  Once, 4 of 4 cycles Administration: 0.25 mg (02/09/2019), 0.25 mg (03/03/2019), 0.25 mg (06/02/2019), 0.25 mg (06/22/2019) CARBOplatin (PARAPLATIN) 410 mg in sodium chloride 0.9 % 250 mL chemo infusion, 410 mg (100 % of original dose 414 mg), Intravenous,  Once, 4 of 4 cycles Dose modification:   (original dose 414 mg, Cycle 1) Administration: 410 mg (02/09/2019), 410 mg (03/03/2019), 410 mg (06/02/2019), 410 mg (06/22/2019) etoposide (VEPESID) 180 mg in sodium chloride 0.9 % 500 mL chemo infusion, 100 mg/m2 = 180 mg, Intravenous,  Once, 4 of 4 cycles Administration: 180 mg (02/09/2019), 180 mg (02/10/2019), 180 mg (02/11/2019), 180 mg (03/03/2019), 180 mg (03/04/2019), 180 mg (03/05/2019), 180 mg (06/02/2019), 180 mg (06/03/2019), 180 mg (06/04/2019), 180 mg (06/22/2019), 180 mg (06/23/2019), 180 mg (06/24/2019) fosaprepitant (EMEND) 150 mg, dexamethasone (DECADRON) 6 mg in sodium chloride 0.9 % 145 mL IVPB, , Intravenous,  Once, 2 of 2 cycles Administration:  (06/02/2019),  (06/22/2019)  for chemotherapy treatment.    Cancer of right colon ([place])  05/04/2019 Initial Diagnosis   Cancer of ascending colon ([place])   11/30/2019 -  Chemotherapy    Patient is on Treatment Plan: COLORECTAL FOLFOX + BEVACIZUMAB Q14D       HISTORY OF PRESENTING ILLNESS: Accompanied by his wife.  Ambulating independently.  Kevin Mckay 71 y.o.  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  In the interim patient underwent angiogram of the inferior mesenteric artery/PTCA; bilateral femoral arteries.  Overall he continues to feel poorly.  Continues to have abdominal pain.  Requesting pain medication.   Continued weight loss.  Review of Systems  Constitutional:   Positive for malaise/fatigue and weight loss. Negative for chills, diaphoresis and fever.  HENT:  Negative for nosebleeds and sore throat.   Eyes:  Negative for double vision.  Respiratory:  Negative for hemoptysis and wheezing.   Cardiovascular:  Negative for chest pain, palpitations and orthopnea.  Gastrointestinal:  Negative for abdominal pain, blood in stool, constipation, diarrhea, heartburn, melena, nausea and vomiting.  Genitourinary:  Negative for dysuria, frequency and urgency.  Musculoskeletal:  Positive for back pain and joint pain.  Skin: Negative.  Negative for itching and rash.  Neurological:  Positive for tingling. Negative for dizziness, focal weakness and weakness.  Endo/Heme/Allergies:  Does not bruise/bleed easily.  Psychiatric/Behavioral:  Negative for depression. The patient is not nervous/anxious and does not have insomnia.      MEDICAL HISTORY:  Past Medical History:  Diagnosis Date   Asthma    Cancer ([place])    Metastatic small cell lung cancer   Cerebral aneurysm    Chronic painful diabetic neuropathy (H[facility])    Depression    Diabetes mellitus without complication ([place])    Essential hypertension    History of kidney stones    Occasional tremors    Tobacco use     SURGICAL HISTORY: Past Surgical History:  Procedure Laterality Date   ABDOMINAL SURGERY     age 73. trauma surgery due to forklift injury- liver and spleen   COLONOSCOPY WITH PROPOFOL N/A 03/27/2019   Procedure: COLONOSCOPY WITH PROPOFOL;  Surgeon: Jonathon Bellows, MD;  Location: [facility] ENDOSCOPY;  Service: Gastroenterology;  Laterality: N/A;   COLONOSCOPY WITH PROPOFOL N/A 12/02/2021   Procedure: COLONOSCOPY WITH PROPOFOL;  Surgeon: Lesly Rubenstein, MD;  Location: [facility] ENDOSCOPY;  Service: Endoscopy;  Laterality: N/A;   COLOSTOMY REVISION Right 05/04/2019   Procedure: COLON RESECTION RIGHT-right hemicolectomy-open;  Surgeon: Fredirick Maudlin, MD;  Location: [facility] ORS;  Service: General;  Laterality:  Right;   ESOPHAGOGASTRODUODENOSCOPY N/A 12/02/2021   Procedure: ESOPHAGOGASTRODUODENOSCOPY (EGD);  Surgeon: Lesly Rubenstein, MD;  Location: [facility] ENDOSCOPY;  Service: Endoscopy;  Laterality: N/A;   ESOPHAGOGASTRODUODENOSCOPY (EGD) WITH PROPOFOL N/A 03/27/2019   Procedure: ESOPHAGOGASTRODUODENOSCOPY (EGD) WITH PROPOFOL;  Surgeon: Jonathon Bellows, MD;  Location: [facility] ENDOSCOPY;  Service: Gastroenterology;  Laterality: N/A;   IR GENERIC HISTORICAL  05/31/2016   IR ANGIO VERTEBRAL SEL VERTEBRAL BILAT MOD SED 05/31/2016 Consuella Lose, MD [facility]-INTER[facility] RAD   IR GENERIC HISTORICAL  05/31/2016   IR ANGIO INTRA EXTRACRAN SEL INTERNAL CAROTID BILAT MOD SED 05/31/2016 Consuella Lose, MD [facility]   PORTACATH PLACEMENT Right 02/04/2019   Procedure: INSERTION PORT-A-CATH;  Surgeon: [name], MD;  Location: [facility] ORS;  Service: General;  Laterality: Right;   THORACOTOMY Left 01/22/2019   Procedure: PRE OP BRONCH LEFT ANTERIOR THORACOTOMY WITH BIOPSY OF MEDIASTINAL MASS;  Surgeon: [name], MD;  Location: [facility] ORS;  Service: General;  Laterality: Left;   VISCERAL ANGIOGRAPHY N/A 12/18/2021   Procedure: VISCERAL ANGIOGRAPHY;  Surgeon: Algernon Huxley, MD;  Location: [facility] CV LAB;  Service: Cardiovascular;  Laterality: N/A;    SOCIAL HISTORY: Social History   Socioeconomic History   Marital status: Married    Spouse name: Not on file   Number of children: Not on file   Years of education: Not on file   Highest education level: Not on file  Occupational History   Not on file  Tobacco Use   Smoking status: Every Day    Packs/day: 0.50    Years: 50.00  Total pack years: 25.00    Types: Cigarettes   Smokeless tobacco: Never  Vaping Use   Vaping Use: Never used  Substance and Sexual Activity   Alcohol use: No   Drug use: Yes    Types: Barbituates, Marijuana   Sexual activity: Not Currently  Other Topics Concern   Not on file  Social History Narrative   Lives in [place]; wife/  son/grandson [custody]; smoker; vending business; hx of alcoholism- quit at 59.    Social Determinants of Health   Financial Resource Strain: Low Risk  (05/04/2019)   Overall Financial Resource Strain (CARDIA)    Difficulty of Paying Living Expenses: Not hard at all  Food Insecurity: No Food Insecurity (05/04/2019)   Hunger Vital Sign    Worried About Running Out of Food in the Last Year: Never true    Ran Out of Food in the Last Year: Never true  Transportation Needs: No Transportation Needs (05/04/2019)   PRAPARE - Hydrologist (Medical): No    Lack of Transportation (Non-Medical): No  Physical Activity: Inactive (05/04/2019)   Exercise Vital Sign    Days of Exercise per Week: 0 days    Minutes of Exercise per Session: 0 min  Stress: No Stress Concern Present (05/04/2019)   [place]    Feeling of Stress : Not at all  Social Connections: Unknown (05/04/2019)   Social Connection and Isolation Panel [NHANES]    Frequency of Communication with Friends and Family: Patient refused    Frequency of Social Gatherings with Friends and Family: Patient refused    Attends Religious Services: Patient refused    Active Member of Clubs or Organizations: Patient refused    Attends Archivist Meetings: Patient refused    Marital Status: Patient refused  Intimate Partner Violence: Not At Risk (05/04/2019)   Humiliation, Afraid, Rape, and Kick questionnaire    Fear of Current or Ex-Partner: No    Emotionally Abused: No    Physically Abused: No    Sexually Abused: No    FAMILY HISTORY: Family History  Problem Relation Age of Onset   Lymphoma Father    Liver cancer Paternal Uncle    Lung cancer Paternal Uncle    Lung cancer Maternal Uncle     ALLERGIES:  is allergic to ferumoxytol, amoxicillin, and codeine.  MEDICATIONS:  Current Outpatient Medications  Medication Sig Dispense Refill    aspirin EC 81 MG tablet Take 1 tablet (81 mg total) by mouth daily. Swallow whole. 150 tablet 2   atorvastatin (LIPITOR) 10 MG tablet Take 1 tablet (10 mg total) by mouth daily. 30 tablet 11   budesonide-formoterol (SYMBICORT) 160-4.5 MCG/ACT inhaler Inhale 2 puffs into the lungs 2 (two) times daily.     DULoxetine (CYMBALTA) 30 MG capsule Take 90 mg by mouth every morning.     gabapentin (NEURONTIN) 800 MG tablet Take 800 mg by mouth 3 (three) times daily.     insulin glargine (LANTUS) 100 UNIT/ML injection Inject 10 Units into the skin 2 (two) times daily. Morning and Evening if Blood Sugar is over 150     lidocaine-prilocaine (EMLA) cream Apply 1 application. topically as needed. 30-45 mins prior to port access. 30 g 0   lisinopril (ZESTRIL) 5 MG tablet Take 1 tablet (5 mg total) by mouth daily. 30 tablet 0   metFORMIN (GLUCOPHAGE) 1000 MG tablet Take 500 mg by mouth. 2 in the a.m, 1  at noon, 1 at p.m     prazosin (MINIPRESS) 1 MG capsule Take 1 mg by mouth at bedtime.     pyridOXINE (VITAMIN B-6) 100 MG tablet Take 100 mg by mouth daily.     sitaGLIPtin (JANUVIA) 25 MG tablet Take 25 mg by mouth daily.     sodium chloride 1 g tablet Take 1 g by mouth 3 (three) times daily with meals.     clopidogrel (PLAVIX) 75 MG tablet Take 1 tablet (75 mg total) by mouth daily. (Patient not taking: Reported on 12/21/2021) 30 tablet 6   HYDROcodone-acetaminophen (NORCO/VICODIN) 5-325 MG tablet Take 1 tablet by mouth every 12 (twelve) hours as needed for moderate pain. 60 tablet 0   Ipratropium-Albuterol (COMBIVENT) 20-100 MCG/ACT AERS respimat Inhale 1 puff into the lungs every 6 (six) hours as needed.     No current facility-administered medications for this visit.   Facility-Administered Medications Ordered in Other Visits  Medication Dose Route Frequency Provider Last Rate Last Admin   heparin lock flush 100 UNIT/ML injection            heparin lock flush 100 UNIT/ML injection            sodium  chloride flush (NS) 0.9 % injection 10 mL  10 mL Intravenous PRN Charlaine Dalton R, MD   10 mL at 09/13/20 0943   sodium chloride flush (NS) 0.9 % injection 10 mL  10 mL Intravenous PRN Cammie Sickle, MD   10 mL at 07/13/21 0948      .  PHYSICAL EXAMINATION: ECOG PERFORMANCE STATUS: 0 - Asymptomatic  Vitals:   12/21/21 1120  BP: (!) 197/73  Pulse: 66  Temp: (!) 97.2 F (36.2 C)  SpO2: 100%   Filed Weights   12/21/21 1120  Weight: 139 lb 3.2 oz (63.1 kg)    Physical Exam HENT:     Head: Normocephalic and atraumatic.     Mouth/Throat:     Pharynx: No oropharyngeal exudate.  Eyes:     Pupils: Pupils are equal, round, and reactive to light.  Cardiovascular:     Rate and Rhythm: Normal rate and regular rhythm.  Pulmonary:     Effort: No respiratory distress.     Breath sounds: No wheezing.  Abdominal:     General: Bowel sounds are normal. There is no distension.     Palpations: Abdomen is soft. There is no mass.     Tenderness: There is no abdominal tenderness. There is no guarding or rebound.     Comments: Abdominal incision well-healed.  Musculoskeletal:        General: No tenderness. Normal range of motion.     Cervical back: Normal range of motion and neck supple.  Skin:    General: Skin is warm.  Neurological:     Mental Status: He is alert and oriented to person, place, and time.  Psychiatric:        Mood and Affect: Affect normal.     LABORATORY DATA:  I have reviewed the data as listed Lab Results  Component Value Date   WBC 7.9 12/21/2021   HGB 10.3 (L) 12/21/2021   HCT 31.0 (L) 12/21/2021   MCV 84.5 12/21/2021   PLT 169 12/21/2021   Recent Labs    12/01/21 0641 12/02/21 0614 12/08/21 0853 12/21/21 1046  NA 138 137 133* 131*  K 4.2 3.8 4.2 4.8  CL 108 107 98 99  CO2 _0 GLUCOSE 141* 152*  210* 289*  BUN _0 24*  CREATININE 0.98 0.62 0.88 1.49*  CALCIUM 9.1 8.8* 9.0 8.9  GFRNONAA >60 >60 >60 50*  PROT 6.8  --   6.9 6.8  ALBUMIN 3.7  --  3.5 3.7  AST 21  --  14* 20  ALT 14  --  14 17  ALKPHOS 49  --  53 59  BILITOT 0.6  --  0.5 0.5    RADIOGRAPHIC STUDIES: I have personally reviewed the radiological images as listed and agreed with the findings in the report. PERIPHERAL VASCULAR CATHETERIZATION  Result Date: 12/18/2021 See surgical note for result.  NM PET Image Restage (PS) Skull Base to Thigh (F-18 FDG)  Result Date: 12/14/2021 CLINICAL DATA:  Subsequent treatment strategy for small cell left lung cancer and additional history of right colon cancer. Concern for pancreatic neck mass on recent CT. EXAM: NUCLEAR MEDICINE PET SKULL BASE TO THIGH TECHNIQUE: 6.6 mCi F-18 FDG was injected intravenously. Full-ring PET imaging was performed from the skull base to thigh after the radiotracer. CT data was obtained and used for attenuation correction and anatomic localization. Fasting blood glucose: 197 mg/dl COMPARISON:  12/02/2021 CT angiogram of the abdomen and pelvis. 10/04/2021 CT chest, abdomen and pelvis. 12/25/2018 PET-CT. 10/21/2019 MRI abdomen. FINDINGS: Mediastinal blood pool activity: SUV max 2.1 Liver activity: SUV max NA NECK: No hypermetabolic lymph nodes in the neck. Incidental CT findings: Inferior right maxillary sinus 1.6 cm mucous retention cyst versus polyp. Partial opacification of the right mastoid air cells. CHEST: No enlarged or hypermetabolic axillary, mediastinal or hilar lymph nodes. Sharply marginated mid to upper left perihilar consolidation with associated mild volume loss and distortion and with associated low level generalized FDG uptake with max SUV 2.3, compatible with radiation fibrosis. No focal pulmonary hypermetabolism to suggest tumor recurrence or metastatic disease. Incidental CT findings: Right subclavian Port-A-Cath terminates in the lower third of the SVC. Atherosclerotic nonaneurysmal thoracic aorta. Right upper lobe 0.4 cm solid pulmonary nodule (series 2/image 73), below  PET resolution, not definitely seen on prior CT studies. Coronary atherosclerosis. ABDOMEN/PELVIS: Indistinct 0.7 cm subcapsular hypodense peripheral segment 8 right liver lesion (series 2/image 121), unchanged on the CT images from 11/30/2021 CT and without hypermetabolism. Vague mild hypermetabolism (max SUV 3.0) in the pancreatic neck at the recently questioned focus of hypoenhancement on 12/02/2021 CT angiogram abdomen study. Chronic atrophy of the pancreatic body and tail. No abnormal hypermetabolic activity within the liver, adrenal glands, or spleen. No hypermetabolic lymph nodes in the abdomen or pelvis. Incidental CT findings: Cholecystectomy. Atherosclerotic nonaneurysmal abdominal aorta. Stable postsurgical changes from subtotal right hemicolectomy. Marked sigmoid diverticulosis. Moderate colorectal stool. SKELETON: No focal hypermetabolic activity to suggest skeletal metastasis. Incidental CT findings: none IMPRESSION: 1. Vague mild hypermetabolism (max SUV 3.0) in the pancreatic neck at the recently questioned focus of hypoenhancement on 12/02/2021 CT angiogram abdomen/pelvis study. Imaging findings remain equivocal, with primary pancreatic malignancy not excluded in this location. MRI abdomen without and with IV contrast is the best imaging test for evaluation of the pancreas and is suggested at this time. 2. Stable left upper perihilar radiation fibrosis without evidence of local tumor recurrence. 3. Otherwise no hypermetabolic findings to suggest metastatic disease. Indistinct subcentimeter hypodense peripheral right liver subcapsular lesion is unchanged on the CT images from 11/30/2021 CT and is non hypermetabolic, compatible with treated metastasis. 4. Tiny 0.4 cm right upper lobe pulmonary nodule is below PET resolution and not definitely seen on prior CT studies. Suggest attention  on follow-up chest CT in 3 months. 5. Chronic findings include: Aortic Atherosclerosis (ICD10-I70.0). Coronary  atherosclerosis. Marked sigmoid diverticulosis. Electronically Signed   By: Ilona Sorrel M.D.   On: 12/14/2021 13:24   US Carotid Bilateral  Result Date: 12/02/2021 CLINICAL DATA:  Hypertension, syncope and dizziness EXAM: BILATERAL CAROTID DUPLEX ULTRASOUND TECHNIQUE: Pearline Cables scale imaging, color Doppler and duplex ultrasound were performed of bilateral carotid and vertebral arteries in the neck. COMPARISON:  None Available. FINDINGS: Criteria: Quantification of carotid stenosis is based on velocity parameters that correlate the residual internal carotid diameter with NASCET-based stenosis levels, using the diameter of the distal internal carotid lumen as the denominator for stenosis measurement. The following velocity measurements were obtained: RIGHT ICA: 129/23 cm/sec CCA: 706/23 cm/sec SYSTOLIC ICA/CCA RATIO:  1.0 ECA: 97 cm/sec LEFT ICA: 112/30 cm/sec CCA: 762/83 cm/sec SYSTOLIC ICA/CCA RATIO:  0.9 ECA: 93 cm/sec RIGHT CAROTID ARTERY: Calcified bifurcation atherosclerosis. Despite this, no significant stenosis, velocity elevation, turbulent flow. No significant waveform abnormality. Degree of narrowing estimated less than 50% by ultrasound criteria. RIGHT VERTEBRAL ARTERY:  Normal antegrade flow LEFT CAROTID ARTERY: Slightly less calcified bifurcation atherosclerosis. Negative for significant stenosis, velocity elevation, turbulent flow. No significant waveform abnormality. Degree of narrowing also less than 50% by ultrasound criteria. LEFT VERTEBRAL ARTERY:  Normal antegrade flow IMPRESSION: Bilateral carotid atherosclerosis. Negative for stenosis. Degree of narrowing less than 50% bilaterally by ultrasound criteria. Patent antegrade vertebral flow bilaterally Electronically Signed   By: Jerilynn Mages.  Shick M.D.   On: 12/02/2021 13:56   CT Angio Abd/Pel w/ and/or w/o  Result Date: 12/02/2021 CLINICAL DATA:  Abdominal pain and rectal bleeding. Concern for mesenteric ischemia. EXAM: CTA ABDOMEN AND PELVIS WITHOUT  AND WITH CONTRAST TECHNIQUE: Multidetector CT imaging of the abdomen and pelvis was performed using the standard protocol during bolus administration of intravenous contrast. Multiplanar reconstructed images and MIPs were obtained and reviewed to evaluate the vascular anatomy. RADIATION DOSE REDUCTION: This exam was performed according to the departmental dose-optimization program which includes automated exposure control, adjustment of the mA and/or kV according to patient size and/or use of iterative reconstruction technique. CONTRAST:  169m OMNIPAQUE IOHEXOL 350 MG/ML SOLN COMPARISON:  CT AP 11/30/2021 FINDINGS: VASCULAR Aorta: Normal caliber aorta without aneurysm, dissection, vasculitis or significant stenosis. Extensive aortic atherosclerotic calcifications. Celiac: Patent without evidence of aneurysm, dissection, vasculitis or significant stenosis. SMA: Patent without evidence of aneurysm, dissection, vasculitis or significant stenosis. Renals: Both renal arteries are patent without evidence of aneurysm, dissection, vasculitis, fibromuscular dysplasia or significant stenosis. There is a short segment IMA: High-grade, nonocclusive luminal narrowing secondary to calcified plaque within the proximal IMA, image 96/5. A second area of high-grade, nonocclusive luminal narrowing is noted just beyond the bifurcation of the IMA, image 111/5. The branches of the IMA appear well opacified beyond this area of stenosis. Inflow: Patent without evidence of aneurysm, dissection, vasculitis or significant stenosis. Proximal Outflow: Atherosclerotic calcifications involve bilateral common iliac arteries and their branches without occlusion. Veins: Extensive left upper quadrant varicosities are identified. Review of the MIP images confirms the above findings. NON-VASCULAR Lower chest: No acute abnormality. Hepatobiliary: Unchanged appearance of previously characterized subcapsular metastasis within the periphery of the right  hepatic lobe measuring 1.6 x 0.9 cm, image 31/12. No new focal liver abnormalities. Gallbladder is unremarkable. Pancreas: On the arterial phase images there is hypoenhancement involving the neck of the pancreas measuring 3.0 x 2.8 by 2.4 cm. Additionally, there is focal luminal narrowing of the portal vein just beyond the  portal venous confluence, image 52/2 which when compared with remote study from 06/07/2020 is a new finding. This is moderately suspicious for underlying neoplastic process involving the pancreas. Body and tail of pancreas are chronically atrophic or congenitally absent. Spleen: Normal in size without focal abnormality. Adrenals/Urinary Tract: Normal appearance of the adrenal glands. No suspicious kidney mass or hydronephrosis identified. Urinary bladder appears normal. Stomach/Bowel: The stomach appears within normal limits. Postoperative changes from previous right hemicolectomy. Enterocolonic anastomosis has been performed in the right hemiabdomen. The colon appears diffusely decompressed. No significant bowel wall thickening or inflammation. No pathologic dilatation of the abdomen or pelvis. Sigmoid diverticulosis noted without signs of acute diverticulitis. No signs of arterial phase intraluminal contrast of extravasation of intravenous contrast material to suggest active bleeding. On the delayed phase imaging there is no pooling of intravenous contrast material within the large or small bowel loops. Lymphatic: No signs abdominopelvic adenopathy identified. Reproductive: Prostate gland appears enlarged. Other: No free fluid or fluid collections within the abdomen or pelvis. No signs of pneumoperitoneum. Musculoskeletal: Degenerative disc disease noted within the lumbar spine. IMPRESSION: 1. No signs of arterial phase intraluminal contrast extravasation of intravenous contrast material to suggest active bleeding. On the delayed phase imaging there is no pooling of intravenous contrast material  within the large or small bowel loops. 2. High-grade, nonocclusive luminal narrowing of the proximal IMA. A second focus of high-grade luminal narrowing due to calcified plaque is noted within a proximal branch of the IMA to the rectum. The distal branches of the IMA appear well opacified beyond these areas of stenosis. 3. There is a subtle area hypoenhancement involving the neck of pancreas which results in focal luminal narrowing of the portal vein just beyond the portal venous confluence. This is a new finding from 11/30/2021 and is moderately suspicious. Underlying neoplastic process cannot be excluded. When the patient is clinically stable, is able to suspend respirations and remain motionless consider more definitive characterization with contrast enhanced liver protocol MRI. Alternatively, a restaging PET-CT may be helpful to assess for abnormal hypermetabolism within this area in this patient known liver metastasis. 4. Stable appearance of subcapsular metastasis within the periphery of the right hepatic lobe. 5. Status post right hemicolectomy with ileocolic anastomosis. 6. Sigmoid diverticulosis without signs of acute diverticulitis. 7. Enlarged prostate gland. 8. Aortic Atherosclerosis (ICD10-I70.0). Electronically Signed   By: Kerby Moors M.D.   On: 12/02/2021 12:40   CT ABDOMEN PELVIS W CONTRAST  Result Date: 11/30/2021 CLINICAL DATA:  Abdominal pain, acute, nonlocalized. two episodes of dark tarry stools this week. Previously treated for colon cancer. Also reports dizziness, and indigestion. Reports poor appetite for the last three days due to nausea and epigastric pain. EXAM: CT ABDOMEN AND PELVIS WITH CONTRAST TECHNIQUE: Multidetector CT imaging of the abdomen and pelvis was performed using the standard protocol following bolus administration of intravenous contrast. RADIATION DOSE REDUCTION: This exam was performed according to the departmental dose-optimization program which includes automated  exposure control, adjustment of the mA and/or kV according to patient size and/or use of iterative reconstruction technique. CONTRAST:  152m OMNIPAQUE IOHEXOL 300 MG/ML  SOLN COMPARISON:  CT abdomen pelvis 12/07/2020 FINDINGS: Lower chest: Similar-appearing likely distal esophageal wall lipomatous lesion (2:9). No acute abnormality. Hepatobiliary: Vague peripheral right hepatic lobe hypodensity (2:11) consistent with a biopsy-proven metastasis. No gallstones, gallbladder wall thickening, or pericholecystic fluid. No biliary dilatation. Pancreas: No focal lesion. Normal pancreatic contour. No surrounding inflammatory changes. No main pancreatic ductal dilatation. Spleen: Normal in  size without focal abnormality. Adrenals/Urinary Tract: No adrenal nodule bilaterally. Bilateral kidneys enhance symmetrically. A 1.2 cm fluid density lesion within left kidney statistically likely represents a simple renal cyst. Simple renal cysts, in the absence of clinically indicated signs/symptoms, require no independent follow-up. No hydronephrosis. No hydroureter. The urinary bladder is unremarkable. On delayed imaging, there is no urothelial wall thickening and there are no filling defects in the opacified portions of the bilateral collecting systems or ureters. Stomach/Bowel: Partial colectomy involving the right colon. Stomach is within normal limits. No evidence of bowel wall thickening or dilatation. Scattered colonic diverticulosis. Appendix appears normal. Vascular/Lymphatic: The main portal, splenic, superior mesenteric veins are patent. Perigastric and left upper quadrant venous collaterals. No abdominal aorta or iliac aneurysm. Severe atherosclerotic plaque of the aorta and its branches. No abdominal, pelvic, or inguinal lymphadenopathy. Reproductive: Prostate is unremarkable. Other: No intraperitoneal free fluid. No intraperitoneal free gas. No organized fluid collection. Musculoskeletal: No abdominal wall hernia or  abnormality. No suspicious lytic or blastic osseous lesions. No acute displaced fracture. L5-S1 intervertebral disc space vacuum phenomenon and endplate sclerosis as well as posterior disc osteophyte complex formation, osteophyte formation, facet arthropathy. IMPRESSION: 1. Perigastric and left upper quadrant venous collaterals. Limited evaluation for an active gastrointestinal hemorrhage on this portal venous study. 2. Status post partial colectomy with treated right hepatic metastasis. 3. Aortic Atherosclerosis (ICD10-I70.0) - severe. Electronically Signed   By: Iven Finn M.D.   On: 11/30/2021 17:23   CT HEAD WO CONTRAST (5MM)  Result Date: 11/30/2021 CLINICAL DATA:  Head trauma, minor (Age >= 65y) EXAM: CT HEAD WITHOUT CONTRAST TECHNIQUE: Contiguous axial images were obtained from the base of the skull through the vertex without intravenous contrast. RADIATION DOSE REDUCTION: This exam was performed according to the departmental dose-optimization program which includes automated exposure control, adjustment of the mA and/or kV according to patient size and/or use of iterative reconstruction technique. COMPARISON:  MRI head 02/04/2019 FINDINGS: Brain: No evidence of large-territorial acute infarction. No parenchymal hemorrhage. No mass lesion. No extra-axial collection. No mass effect or midline shift. No hydrocephalus. Basilar cisterns are patent. Vascular: No hyperdense vessel. Atherosclerotic calcifications are present within the cavernous internal carotid and vertebral arteries. Skull: No acute fracture or focal lesion. Sinuses/Orbits: Paranasal sinuses and mastoid air cells are clear. The orbits are unremarkable. Other: None. IMPRESSION: No acute intracranial abnormality. Electronically Signed   By: Iven Finn M.D.   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  CT scan -JUNE 9th, 2023-no evidence of progression of subcapsular liver lesion/biopsy-proven colon cancer; no evidence of new disease; however subsequent CT done for mesenteric ischemia noted to have a peripancreatic mass [ hypoenhancement involving the neck of the pancreas measuring 3.0 x 2.8 by 2.4 cm].  December 18, 2021 PET scan:  Vague mild hypermetabolism (max SUV 3.0) in the pancreatic neck.  Recommend MRI with and without contrast as planned on July 1st for further evaluation.  Discussed the differential diagnosis includes-metastatic disease from small cell lung cancer/or colon cancer versus primary pancreatic cancer Patient understand that he will likely need endoscopic ultrasound for further evaluation.  CA 19-9 slightly elevated repeat CA 19-9; CEA at next visit.  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 STABLE.  #PAD/Abdominal pain-likely secondary to mesenteric ischemia-status post PTCA balloon angioplasty; continue aspirin and Plavix as per vascular.  # pre-Syncopal episodes: random lightheaded; slouch to ground; at least a mins [4-5 times day]; headaches;  MRI of the brain- MAY 2023- NEGATIVE for metastases.  Labile blood pressures monitor for now.  Continue hold blood pressures medications.  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   #Hyponatremia sodium 132- STABLE.  # Back pain- MRI lumbar spine- July 2021-NEG for mets; severe arthritic changes; spinal canal stenosis-stable hydrocodone 1 day prn - STABLE.   # Poorly controlled diabetes blood sugars-better controlled as per patients-overall stable [id number]-continue close follow-up with PCP.   #Prostatism symptoms: Continue Prazosin.   #  DISPOSITION:  # follow up in in 2-3 weeks-MD; labs- cbc/cmp; ca-19-9;CEA;  possible IVFs over 1 hours  Cc; [place]-     All questions were answered. The patient knows to call the clinic with any problems, questions or concerns.    Cammie Sickle, MD 12/21/2021 4:04 PM

## 2021-12-21 NOTE — Progress Notes (Signed)
MRI- Added to MDT on 7/06.  GB

## 2021-12-21 NOTE — Telephone Encounter (Signed)
Per secure chat with Dr. B: Please inform patient/wife-that his kidney numbers are slightly low.  Recommend continued hydration/water intake. Thanks  Wife notified.

## 2021-12-21 NOTE — Telephone Encounter (Signed)
Per Dr Lucky Cowboy to call in Clopidogrel 75mg  into pharmacy. Medication was called into [facility] and left pharmacy voicemail. I left a detailed message on patient voicemail.

## 2021-12-21 NOTE — Progress Notes (Signed)
Wife states he does have pain at times, would like refill of hydrocodone prn.(Back, arm, different locations at times pain) States hospital stopped pain med.  Has MRI ABD 12/23/21.  Had some chest pain yesterday. Feels agitated. Wife notices he's not acting like himself.

## 2021-12-21 NOTE — Progress Notes (Signed)
[place] CONSULT NOTE  Patient Care Team: [name], PA as PCP - General (Physician Assistant) Telford Nab, RN as Registered Nurse Clent Jacks, RN as Oncology Nurse Navigator Cammie Sickle, MD as Consulting Physician (Oncology)  CHIEF COMPLAINTS/PURPOSE OF CONSULTATION: Lung cancer/colon cancer   Oncology History Overview Note  # July 2020- SMALL CELL CA METASTATIC TO MEDIASTINAL LN [open Bx; Dr.Oaks]; TxN2M0; July 2nd 2020-PET- 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  MRI brain negative  # aug 17th 2020-carbo etoposide-RT [? 8/19]; #4 cycle carbo-Etop [finished dec 30th,2020]  #August 2020 iron deficient anemia-question etiology; IV Feraheme [no colonoscopy]  # OCT 2020- colo/ Cecal adeno ca; MRI liver- 1 cm enhancing lesion "metastasis" [Dr.Anna/Dr.Cannon]-difficulty biopsy.  November 02-2019 right hemicolectomy- STAGE III [pT3pN1 (2/13LN)]- HOLD adjuvant therapy. MMR- INTACT/LOW  #May 2021-liver biopsy-possible adenocarcinoma; colorectal origin.  Stage IV colon cancer  #May 24-2021-FOLFOX with Avastin; AUG 31st,2021- STABLE liver lesion; SEP 1st, 2021- Discontinue LV+Ox sec to severe fatigue/PN; cont 5FU CIV + Avastin  # COPD/  DM-2- on OHA/ smoker/ PVD/peripheral neuropathy  # History of alcohol abuse/quit 2014/ Hx of abdominal trauma [at 21y]  #July 2021-foundation 1 NGS [liver metastases]- RAS WILD TYPE **  # Jan 1st, 2022-Covid infection [prior vaccination; monoclonal infusion-not hospitalized]  # DIAGNOSIS:   # SMALL CELL CA-limited stage-status post chemoradiation;   # colon cancer-stage IV-solitary liver lesion  GOALS: Control  CURRENT/MOST RECENT THERAPY : FOLFOX with Avastin   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 ([place])  01/29/2019 Initial Diagnosis   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 [facility])   02/09/2019 - 06/24/2019  Chemotherapy   The patient had palonosetron (ALOXI) injection 0.25 mg, 0.25 mg, Intravenous,  Once, 4 of 4 cycles Administration: 0.25 mg (02/09/2019), 0.25 mg (03/03/2019), 0.25 mg (06/02/2019), 0.25 mg (06/22/2019) CARBOplatin (PARAPLATIN) 410 mg in sodium chloride 0.9 % 250 mL chemo infusion, 410 mg (100 % of original dose 414 mg), Intravenous,  Once, 4 of 4 cycles Dose modification:   (original dose 414 mg, Cycle 1) Administration: 410 mg (02/09/2019), 410 mg (03/03/2019), 410 mg (06/02/2019), 410 mg (06/22/2019) etoposide (VEPESID) 180 mg in sodium chloride 0.9 % 500 mL chemo infusion, 100 mg/m2 = 180 mg, Intravenous,  Once, 4 of 4 cycles Administration: 180 mg (02/09/2019), 180 mg (02/10/2019), 180 mg (02/11/2019), 180 mg (03/03/2019), 180 mg (03/04/2019), 180 mg (03/05/2019), 180 mg (06/02/2019), 180 mg (06/03/2019), 180 mg (06/04/2019), 180 mg (06/22/2019), 180 mg (06/23/2019), 180 mg (06/24/2019) fosaprepitant (EMEND) 150 mg, dexamethasone (DECADRON) 6 mg in sodium chloride 0.9 % 145 mL IVPB, , Intravenous,  Once, 2 of 2 cycles Administration:  (06/02/2019),  (06/22/2019)  for chemotherapy treatment.    Cancer of right colon ([place])  05/04/2019 Initial Diagnosis   Cancer of ascending colon ([place])   11/30/2019 -  Chemotherapy    Patient is on Treatment Plan: COLORECTAL FOLFOX + BEVACIZUMAB Q14D       HISTORY OF PRESENTING ILLNESS: Accompanied by his wife.  Ambulating independently.  Kevin Mckay 71 y.o.  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  In the interim patient underwent angiogram of the inferior mesenteric artery/PTCA; bilateral femoral arteries.  Overall he continues to feel poorly.  Continues to have abdominal pain.  Requesting pain medication.   Continued weight loss.  Review of Systems  Constitutional:   Positive for malaise/fatigue and weight loss. Negative for chills, diaphoresis and fever.  HENT:  Negative for nosebleeds and sore throat.   Eyes:  Negative for double vision.  Respiratory:  Negative for hemoptysis and wheezing.   Cardiovascular:  Negative for chest pain, palpitations and orthopnea.  Gastrointestinal:  Negative for abdominal pain, blood in stool, constipation, diarrhea, heartburn, melena, nausea and vomiting.  Genitourinary:  Negative for dysuria, frequency and urgency.  Musculoskeletal:  Positive for back pain and joint pain.  Skin: Negative.  Negative for itching and rash.  Neurological:  Positive for tingling. Negative for dizziness, focal weakness and weakness.  Endo/Heme/Allergies:  Does not bruise/bleed easily.  Psychiatric/Behavioral:  Negative for depression. The patient is not nervous/anxious and does not have insomnia.      MEDICAL HISTORY:  Past Medical History:  Diagnosis Date   Asthma    Cancer ([place])    Metastatic small cell lung cancer   Cerebral aneurysm    Chronic painful diabetic neuropathy (H[facility])    Depression    Diabetes mellitus without complication ([place])    Essential hypertension    History of kidney stones    Occasional tremors    Tobacco use     SURGICAL HISTORY: Past Surgical History:  Procedure Laterality Date   ABDOMINAL SURGERY     age 73. trauma surgery due to forklift injury- liver and spleen   COLONOSCOPY WITH PROPOFOL N/A 03/27/2019   Procedure: COLONOSCOPY WITH PROPOFOL;  Surgeon: Jonathon Bellows, MD;  Location: [facility] ENDOSCOPY;  Service: Gastroenterology;  Laterality: N/A;   COLONOSCOPY WITH PROPOFOL N/A 12/02/2021   Procedure: COLONOSCOPY WITH PROPOFOL;  Surgeon: Lesly Rubenstein, MD;  Location: [facility] ENDOSCOPY;  Service: Endoscopy;  Laterality: N/A;   COLOSTOMY REVISION Right 05/04/2019   Procedure: COLON RESECTION RIGHT-right hemicolectomy-open;  Surgeon: Fredirick Maudlin, MD;  Location: [facility] ORS;  Service: General;  Laterality:  Right;   ESOPHAGOGASTRODUODENOSCOPY N/A 12/02/2021   Procedure: ESOPHAGOGASTRODUODENOSCOPY (EGD);  Surgeon: Lesly Rubenstein, MD;  Location: [facility] ENDOSCOPY;  Service: Endoscopy;  Laterality: N/A;   ESOPHAGOGASTRODUODENOSCOPY (EGD) WITH PROPOFOL N/A 03/27/2019   Procedure: ESOPHAGOGASTRODUODENOSCOPY (EGD) WITH PROPOFOL;  Surgeon: Jonathon Bellows, MD;  Location: [facility] ENDOSCOPY;  Service: Gastroenterology;  Laterality: N/A;   IR GENERIC HISTORICAL  05/31/2016   IR ANGIO VERTEBRAL SEL VERTEBRAL BILAT MOD SED 05/31/2016 Consuella Lose, MD [facility]-INTER[facility] RAD   IR GENERIC HISTORICAL  05/31/2016   IR ANGIO INTRA EXTRACRAN SEL INTERNAL CAROTID BILAT MOD SED 05/31/2016 Consuella Lose, MD [facility]   PORTACATH PLACEMENT Right 02/04/2019   Procedure: INSERTION PORT-A-CATH;  Surgeon: [name], MD;  Location: [facility] ORS;  Service: General;  Laterality: Right;   THORACOTOMY Left 01/22/2019   Procedure: PRE OP BRONCH LEFT ANTERIOR THORACOTOMY WITH BIOPSY OF MEDIASTINAL MASS;  Surgeon: [name], MD;  Location: [facility] ORS;  Service: General;  Laterality: Left;   VISCERAL ANGIOGRAPHY N/A 12/18/2021   Procedure: VISCERAL ANGIOGRAPHY;  Surgeon: Algernon Huxley, MD;  Location: [facility] [place] CV LAB;  Service: Cardiovascular;  Laterality: N/A;    SOCIAL HISTORY: Social History   Socioeconomic History   Marital status: Married    Spouse name: Not on file   Number of children: Not on file   Years of education: Not on file   Highest education level: Not on file  Occupational History   Not on file  Tobacco Use   Smoking status: Every Day    Packs/day: 0.50    Years: 50.00  Total pack years: 25.00    Types: Cigarettes   Smokeless tobacco: Never  Vaping Use   Vaping Use: Never used  Substance and Sexual Activity   Alcohol use: No   Drug use: Yes    Types: Barbituates, Marijuana   Sexual activity: Not Currently  Other Topics Concern   Not on file  Social History Narrative   Lives in [place]; wife/  son/grandson [custody]; smoker; vending business; hx of alcoholism- quit at 59.    Social Determinants of Health   Financial Resource Strain: Low Risk  (05/04/2019)   Overall Financial Resource Strain (CARDIA)    Difficulty of Paying Living Expenses: Not hard at all  Food Insecurity: No Food Insecurity (05/04/2019)   Hunger Vital Sign    Worried About Running Out of Food in the Last Year: Never true    Ran Out of Food in the Last Year: Never true  Transportation Needs: No Transportation Needs (05/04/2019)   PRAPARE - Hydrologist (Medical): No    Lack of Transportation (Non-Medical): No  Physical Activity: Inactive (05/04/2019)   Exercise Vital Sign    Days of Exercise per Week: 0 days    Minutes of Exercise per Session: 0 min  Stress: No Stress Concern Present (05/04/2019)   [place]    Feeling of Stress : Not at all  Social Connections: Unknown (05/04/2019)   Social Connection and Isolation Panel [NHANES]    Frequency of Communication with Friends and Family: Patient refused    Frequency of Social Gatherings with Friends and Family: Patient refused    Attends Religious Services: Patient refused    Active Member of Clubs or Organizations: Patient refused    Attends Archivist Meetings: Patient refused    Marital Status: Patient refused  Intimate Partner Violence: Not At Risk (05/04/2019)   Humiliation, Afraid, Rape, and Kick questionnaire    Fear of Current or Ex-Partner: No    Emotionally Abused: No    Physically Abused: No    Sexually Abused: No    FAMILY HISTORY: Family History  Problem Relation Age of Onset   Lymphoma Father    Liver cancer Paternal Uncle    Lung cancer Paternal Uncle    Lung cancer Maternal Uncle     ALLERGIES:  is allergic to ferumoxytol, amoxicillin, and codeine.  MEDICATIONS:  Current Outpatient Medications  Medication Sig Dispense Refill    aspirin EC 81 MG tablet Take 1 tablet (81 mg total) by mouth daily. Swallow whole. 150 tablet 2   atorvastatin (LIPITOR) 10 MG tablet Take 1 tablet (10 mg total) by mouth daily. 30 tablet 11   budesonide-formoterol (SYMBICORT) 160-4.5 MCG/ACT inhaler Inhale 2 puffs into the lungs 2 (two) times daily.     DULoxetine (CYMBALTA) 30 MG capsule Take 90 mg by mouth every morning.     gabapentin (NEURONTIN) 800 MG tablet Take 800 mg by mouth 3 (three) times daily.     insulin glargine (LANTUS) 100 UNIT/ML injection Inject 10 Units into the skin 2 (two) times daily. Morning and Evening if Blood Sugar is over 150     lidocaine-prilocaine (EMLA) cream Apply 1 application. topically as needed. 30-45 mins prior to port access. 30 g 0   lisinopril (ZESTRIL) 5 MG tablet Take 1 tablet (5 mg total) by mouth daily. 30 tablet 0   metFORMIN (GLUCOPHAGE) 1000 MG tablet Take 500 mg by mouth. 2 in the a.m, 1  at noon, 1 at p.m     prazosin (MINIPRESS) 1 MG capsule Take 1 mg by mouth at bedtime.     pyridOXINE (VITAMIN B-6) 100 MG tablet Take 100 mg by mouth daily.     sitaGLIPtin (JANUVIA) 25 MG tablet Take 25 mg by mouth daily.     sodium chloride 1 g tablet Take 1 g by mouth 3 (three) times daily with meals.     clopidogrel (PLAVIX) 75 MG tablet Take 1 tablet (75 mg total) by mouth daily. (Patient not taking: Reported on 12/21/2021) 30 tablet 6   HYDROcodone-acetaminophen (NORCO/VICODIN) 5-325 MG tablet Take 1 tablet by mouth every 12 (twelve) hours as needed for moderate pain. 60 tablet 0   Ipratropium-Albuterol (COMBIVENT) 20-100 MCG/ACT AERS respimat Inhale 1 puff into the lungs every 6 (six) hours as needed.     No current facility-administered medications for this visit.   Facility-Administered Medications Ordered in Other Visits  Medication Dose Route Frequency Provider Last Rate Last Admin   heparin lock flush 100 UNIT/ML injection            heparin lock flush 100 UNIT/ML injection            sodium  chloride flush (NS) 0.9 % injection 10 mL  10 mL Intravenous PRN Charlaine Dalton R, MD   10 mL at 09/13/20 0943   sodium chloride flush (NS) 0.9 % injection 10 mL  10 mL Intravenous PRN Cammie Sickle, MD   10 mL at 07/13/21 0948      .  PHYSICAL EXAMINATION: ECOG PERFORMANCE STATUS: 0 - Asymptomatic  Vitals:   12/21/21 1120  BP: (!) 197/73  Pulse: 66  Temp: (!) 97.2 F (36.2 C)  SpO2: 100%   Filed Weights   12/21/21 1120  Weight: 139 lb 3.2 oz (63.1 kg)    Physical Exam HENT:     Head: Normocephalic and atraumatic.     Mouth/Throat:     Pharynx: No oropharyngeal exudate.  Eyes:     Pupils: Pupils are equal, round, and reactive to light.  Cardiovascular:     Rate and Rhythm: Normal rate and regular rhythm.  Pulmonary:     Effort: No respiratory distress.     Breath sounds: No wheezing.  Abdominal:     General: Bowel sounds are normal. There is no distension.     Palpations: Abdomen is soft. There is no mass.     Tenderness: There is no abdominal tenderness. There is no guarding or rebound.     Comments: Abdominal incision well-healed.  Musculoskeletal:        General: No tenderness. Normal range of motion.     Cervical back: Normal range of motion and neck supple.  Skin:    General: Skin is warm.  Neurological:     Mental Status: He is alert and oriented to person, place, and time.  Psychiatric:        Mood and Affect: Affect normal.     LABORATORY DATA:  I have reviewed the data as listed Lab Results  Component Value Date   WBC 7.9 12/21/2021   HGB 10.3 (L) 12/21/2021   HCT 31.0 (L) 12/21/2021   MCV 84.5 12/21/2021   PLT 169 12/21/2021   Recent Labs    12/01/21 0641 12/02/21 0614 12/08/21 0853 12/21/21 1046  NA 138 137 133* 131*  K 4.2 3.8 4.2 4.8  CL 108 107 98 99  CO2 _0 GLUCOSE 141* 152*  210* 289*  BUN _0 24*  CREATININE 0.98 0.62 0.88 1.49*  CALCIUM 9.1 8.8* 9.0 8.9  GFRNONAA >60 >60 >60 50*  PROT 6.8  --   6.9 6.8  ALBUMIN 3.7  --  3.5 3.7  AST 21  --  14* 20  ALT 14  --  14 17  ALKPHOS 49  --  53 59  BILITOT 0.6  --  0.5 0.5    RADIOGRAPHIC STUDIES: I have personally reviewed the radiological images as listed and agreed with the findings in the report. PERIPHERAL VASCULAR CATHETERIZATION  Result Date: 12/18/2021 See surgical note for result.  NM PET Image Restage (PS) Skull Base to Thigh (F-18 FDG)  Result Date: 12/14/2021 CLINICAL DATA:  Subsequent treatment strategy for small cell left lung cancer and additional history of right colon cancer. Concern for pancreatic neck mass on recent CT. EXAM: NUCLEAR MEDICINE PET SKULL BASE TO THIGH TECHNIQUE: 6.6 mCi F-18 FDG was injected intravenously. Full-ring PET imaging was performed from the skull base to thigh after the radiotracer. CT data was obtained and used for attenuation correction and anatomic localization. Fasting blood glucose: 197 mg/dl COMPARISON:  12/02/2021 CT angiogram of the abdomen and pelvis. 10/04/2021 CT chest, abdomen and pelvis. 12/25/2018 PET-CT. 10/21/2019 MRI abdomen. FINDINGS: Mediastinal blood pool activity: SUV max 2.1 Liver activity: SUV max NA NECK: No hypermetabolic lymph nodes in the neck. Incidental CT findings: Inferior right maxillary sinus 1.6 cm mucous retention cyst versus polyp. Partial opacification of the right mastoid air cells. CHEST: No enlarged or hypermetabolic axillary, mediastinal or hilar lymph nodes. Sharply marginated mid to upper left perihilar consolidation with associated mild volume loss and distortion and with associated low level generalized FDG uptake with max SUV 2.3, compatible with radiation fibrosis. No focal pulmonary hypermetabolism to suggest tumor recurrence or metastatic disease. Incidental CT findings: Right subclavian Port-A-Cath terminates in the lower third of the SVC. Atherosclerotic nonaneurysmal thoracic aorta. Right upper lobe 0.4 cm solid pulmonary nodule (series 2/image 73), below  PET resolution, not definitely seen on prior CT studies. Coronary atherosclerosis. ABDOMEN/PELVIS: Indistinct 0.7 cm subcapsular hypodense peripheral segment 8 right liver lesion (series 2/image 121), unchanged on the CT images from 11/30/2021 CT and without hypermetabolism. Vague mild hypermetabolism (max SUV 3.0) in the pancreatic neck at the recently questioned focus of hypoenhancement on 12/02/2021 CT angiogram abdomen study. Chronic atrophy of the pancreatic body and tail. No abnormal hypermetabolic activity within the liver, adrenal glands, or spleen. No hypermetabolic lymph nodes in the abdomen or pelvis. Incidental CT findings: Cholecystectomy. Atherosclerotic nonaneurysmal abdominal aorta. Stable postsurgical changes from subtotal right hemicolectomy. Marked sigmoid diverticulosis. Moderate colorectal stool. SKELETON: No focal hypermetabolic activity to suggest skeletal metastasis. Incidental CT findings: none IMPRESSION: 1. Vague mild hypermetabolism (max SUV 3.0) in the pancreatic neck at the recently questioned focus of hypoenhancement on 12/02/2021 CT angiogram abdomen/pelvis study. Imaging findings remain equivocal, with primary pancreatic malignancy not excluded in this location. MRI abdomen without and with IV contrast is the best imaging test for evaluation of the pancreas and is suggested at this time. 2. Stable left upper perihilar radiation fibrosis without evidence of local tumor recurrence. 3. Otherwise no hypermetabolic findings to suggest metastatic disease. Indistinct subcentimeter hypodense peripheral right liver subcapsular lesion is unchanged on the CT images from 11/30/2021 CT and is non hypermetabolic, compatible with treated metastasis. 4. Tiny 0.4 cm right upper lobe pulmonary nodule is below PET resolution and not definitely seen on prior CT studies. Suggest attention  on follow-up chest CT in 3 months. 5. Chronic findings include: Aortic Atherosclerosis (ICD10-I70.0). Coronary  atherosclerosis. Marked sigmoid diverticulosis. Electronically Signed   By: Ilona Sorrel M.D.   On: 12/14/2021 13:24   US Carotid Bilateral  Result Date: 12/02/2021 CLINICAL DATA:  Hypertension, syncope and dizziness EXAM: BILATERAL CAROTID DUPLEX ULTRASOUND TECHNIQUE: Pearline Cables scale imaging, color Doppler and duplex ultrasound were performed of bilateral carotid and vertebral arteries in the neck. COMPARISON:  None Available. FINDINGS: Criteria: Quantification of carotid stenosis is based on velocity parameters that correlate the residual internal carotid diameter with NASCET-based stenosis levels, using the diameter of the distal internal carotid lumen as the denominator for stenosis measurement. The following velocity measurements were obtained: RIGHT ICA: 129/23 cm/sec CCA: 706/23 cm/sec SYSTOLIC ICA/CCA RATIO:  1.0 ECA: 97 cm/sec LEFT ICA: 112/30 cm/sec CCA: 762/83 cm/sec SYSTOLIC ICA/CCA RATIO:  0.9 ECA: 93 cm/sec RIGHT CAROTID ARTERY: Calcified bifurcation atherosclerosis. Despite this, no significant stenosis, velocity elevation, turbulent flow. No significant waveform abnormality. Degree of narrowing estimated less than 50% by ultrasound criteria. RIGHT VERTEBRAL ARTERY:  Normal antegrade flow LEFT CAROTID ARTERY: Slightly less calcified bifurcation atherosclerosis. Negative for significant stenosis, velocity elevation, turbulent flow. No significant waveform abnormality. Degree of narrowing also less than 50% by ultrasound criteria. LEFT VERTEBRAL ARTERY:  Normal antegrade flow IMPRESSION: Bilateral carotid atherosclerosis. Negative for stenosis. Degree of narrowing less than 50% bilaterally by ultrasound criteria. Patent antegrade vertebral flow bilaterally Electronically Signed   By: Jerilynn Mages.  Shick M.D.   On: 12/02/2021 13:56   CT Angio Abd/Pel w/ and/or w/o  Result Date: 12/02/2021 CLINICAL DATA:  Abdominal pain and rectal bleeding. Concern for mesenteric ischemia. EXAM: CTA ABDOMEN AND PELVIS WITHOUT  AND WITH CONTRAST TECHNIQUE: Multidetector CT imaging of the abdomen and pelvis was performed using the standard protocol during bolus administration of intravenous contrast. Multiplanar reconstructed images and MIPs were obtained and reviewed to evaluate the vascular anatomy. RADIATION DOSE REDUCTION: This exam was performed according to the departmental dose-optimization program which includes automated exposure control, adjustment of the mA and/or kV according to patient size and/or use of iterative reconstruction technique. CONTRAST:  169m OMNIPAQUE IOHEXOL 350 MG/ML SOLN COMPARISON:  CT AP 11/30/2021 FINDINGS: VASCULAR Aorta: Normal caliber aorta without aneurysm, dissection, vasculitis or significant stenosis. Extensive aortic atherosclerotic calcifications. Celiac: Patent without evidence of aneurysm, dissection, vasculitis or significant stenosis. SMA: Patent without evidence of aneurysm, dissection, vasculitis or significant stenosis. Renals: Both renal arteries are patent without evidence of aneurysm, dissection, vasculitis, fibromuscular dysplasia or significant stenosis. There is a short segment IMA: High-grade, nonocclusive luminal narrowing secondary to calcified plaque within the proximal IMA, image 96/5. A second area of high-grade, nonocclusive luminal narrowing is noted just beyond the bifurcation of the IMA, image 111/5. The branches of the IMA appear well opacified beyond this area of stenosis. Inflow: Patent without evidence of aneurysm, dissection, vasculitis or significant stenosis. Proximal Outflow: Atherosclerotic calcifications involve bilateral common iliac arteries and their branches without occlusion. Veins: Extensive left upper quadrant varicosities are identified. Review of the MIP images confirms the above findings. NON-VASCULAR Lower chest: No acute abnormality. Hepatobiliary: Unchanged appearance of previously characterized subcapsular metastasis within the periphery of the right  hepatic lobe measuring 1.6 x 0.9 cm, image 31/12. No new focal liver abnormalities. Gallbladder is unremarkable. Pancreas: On the arterial phase images there is hypoenhancement involving the neck of the pancreas measuring 3.0 x 2.8 by 2.4 cm. Additionally, there is focal luminal narrowing of the portal vein just beyond the  portal venous confluence, image 52/2 which when compared with remote study from 06/07/2020 is a new finding. This is moderately suspicious for underlying neoplastic process involving the pancreas. Body and tail of pancreas are chronically atrophic or congenitally absent. Spleen: Normal in size without focal abnormality. Adrenals/Urinary Tract: Normal appearance of the adrenal glands. No suspicious kidney mass or hydronephrosis identified. Urinary bladder appears normal. Stomach/Bowel: The stomach appears within normal limits. Postoperative changes from previous right hemicolectomy. Enterocolonic anastomosis has been performed in the right hemiabdomen. The colon appears diffusely decompressed. No significant bowel wall thickening or inflammation. No pathologic dilatation of the abdomen or pelvis. Sigmoid diverticulosis noted without signs of acute diverticulitis. No signs of arterial phase intraluminal contrast of extravasation of intravenous contrast material to suggest active bleeding. On the delayed phase imaging there is no pooling of intravenous contrast material within the large or small bowel loops. Lymphatic: No signs abdominopelvic adenopathy identified. Reproductive: Prostate gland appears enlarged. Other: No free fluid or fluid collections within the abdomen or pelvis. No signs of pneumoperitoneum. Musculoskeletal: Degenerative disc disease noted within the lumbar spine. IMPRESSION: 1. No signs of arterial phase intraluminal contrast extravasation of intravenous contrast material to suggest active bleeding. On the delayed phase imaging there is no pooling of intravenous contrast material  within the large or small bowel loops. 2. High-grade, nonocclusive luminal narrowing of the proximal IMA. A second focus of high-grade luminal narrowing due to calcified plaque is noted within a proximal branch of the IMA to the rectum. The distal branches of the IMA appear well opacified beyond these areas of stenosis. 3. There is a subtle area hypoenhancement involving the neck of pancreas which results in focal luminal narrowing of the portal vein just beyond the portal venous confluence. This is a new finding from 11/30/2021 and is moderately suspicious. Underlying neoplastic process cannot be excluded. When the patient is clinically stable, is able to suspend respirations and remain motionless consider more definitive characterization with contrast enhanced liver protocol MRI. Alternatively, a restaging PET-CT may be helpful to assess for abnormal hypermetabolism within this area in this patient known liver metastasis. 4. Stable appearance of subcapsular metastasis within the periphery of the right hepatic lobe. 5. Status post right hemicolectomy with ileocolic anastomosis. 6. Sigmoid diverticulosis without signs of acute diverticulitis. 7. Enlarged prostate gland. 8. Aortic Atherosclerosis (ICD10-I70.0). Electronically Signed   By: Kerby Moors M.D.   On: 12/02/2021 12:40   CT ABDOMEN PELVIS W CONTRAST  Result Date: 11/30/2021 CLINICAL DATA:  Abdominal pain, acute, nonlocalized. two episodes of dark tarry stools this week. Previously treated for colon cancer. Also reports dizziness, and indigestion. Reports poor appetite for the last three days due to nausea and epigastric pain. EXAM: CT ABDOMEN AND PELVIS WITH CONTRAST TECHNIQUE: Multidetector CT imaging of the abdomen and pelvis was performed using the standard protocol following bolus administration of intravenous contrast. RADIATION DOSE REDUCTION: This exam was performed according to the departmental dose-optimization program which includes automated  exposure control, adjustment of the mA and/or kV according to patient size and/or use of iterative reconstruction technique. CONTRAST:  152m OMNIPAQUE IOHEXOL 300 MG/ML  SOLN COMPARISON:  CT abdomen pelvis 12/07/2020 FINDINGS: Lower chest: Similar-appearing likely distal esophageal wall lipomatous lesion (2:9). No acute abnormality. Hepatobiliary: Vague peripheral right hepatic lobe hypodensity (2:11) consistent with a biopsy-proven metastasis. No gallstones, gallbladder wall thickening, or pericholecystic fluid. No biliary dilatation. Pancreas: No focal lesion. Normal pancreatic contour. No surrounding inflammatory changes. No main pancreatic ductal dilatation. Spleen: Normal in  size without focal abnormality. Adrenals/Urinary Tract: No adrenal nodule bilaterally. Bilateral kidneys enhance symmetrically. A 1.2 cm fluid density lesion within left kidney statistically likely represents a simple renal cyst. Simple renal cysts, in the absence of clinically indicated signs/symptoms, require no independent follow-up. No hydronephrosis. No hydroureter. The urinary bladder is unremarkable. On delayed imaging, there is no urothelial wall thickening and there are no filling defects in the opacified portions of the bilateral collecting systems or ureters. Stomach/Bowel: Partial colectomy involving the right colon. Stomach is within normal limits. No evidence of bowel wall thickening or dilatation. Scattered colonic diverticulosis. Appendix appears normal. Vascular/Lymphatic: The main portal, splenic, superior mesenteric veins are patent. Perigastric and left upper quadrant venous collaterals. No abdominal aorta or iliac aneurysm. Severe atherosclerotic plaque of the aorta and its branches. No abdominal, pelvic, or inguinal lymphadenopathy. Reproductive: Prostate is unremarkable. Other: No intraperitoneal free fluid. No intraperitoneal free gas. No organized fluid collection. Musculoskeletal: No abdominal wall hernia or  abnormality. No suspicious lytic or blastic osseous lesions. No acute displaced fracture. L5-S1 intervertebral disc space vacuum phenomenon and endplate sclerosis as well as posterior disc osteophyte complex formation, osteophyte formation, facet arthropathy. IMPRESSION: 1. Perigastric and left upper quadrant venous collaterals. Limited evaluation for an active gastrointestinal hemorrhage on this portal venous study. 2. Status post partial colectomy with treated right hepatic metastasis. 3. Aortic Atherosclerosis (ICD10-I70.0) - severe. Electronically Signed   By: Iven Finn M.D.   On: 11/30/2021 17:23   CT HEAD WO CONTRAST (5MM)  Result Date: 11/30/2021 CLINICAL DATA:  Head trauma, minor (Age >= 65y) EXAM: CT HEAD WITHOUT CONTRAST TECHNIQUE: Contiguous axial images were obtained from the base of the skull through the vertex without intravenous contrast. RADIATION DOSE REDUCTION: This exam was performed according to the departmental dose-optimization program which includes automated exposure control, adjustment of the mA and/or kV according to patient size and/or use of iterative reconstruction technique. COMPARISON:  MRI head 02/04/2019 FINDINGS: Brain: No evidence of large-territorial acute infarction. No parenchymal hemorrhage. No mass lesion. No extra-axial collection. No mass effect or midline shift. No hydrocephalus. Basilar cisterns are patent. Vascular: No hyperdense vessel. Atherosclerotic calcifications are present within the cavernous internal carotid and vertebral arteries. Skull: No acute fracture or focal lesion. Sinuses/Orbits: Paranasal sinuses and mastoid air cells are clear. The orbits are unremarkable. Other: None. IMPRESSION: No acute intracranial abnormality. Electronically Signed   By: Iven Finn M.D.   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  CT scan -JUNE 9th, 2023-no evidence of progression of subcapsular liver lesion/biopsy-proven colon cancer; no evidence of new disease; however subsequent CT done for mesenteric ischemia noted to have a peripancreatic mass [ hypoenhancement involving the neck of the pancreas measuring 3.0 x 2.8 by 2.4 cm].  December 18, 2021 PET scan:  Vague mild hypermetabolism (max SUV 3.0) in the pancreatic neck.  Recommend MRI with and without contrast as planned on July 1st for further evaluation.  Discussed the differential diagnosis includes-metastatic disease from small cell lung cancer/or colon cancer versus primary pancreatic cancer Patient understand that he will likely need endoscopic ultrasound for further evaluation.  CA 19-9 slightly elevated repeat CA 19-9; CEA at next visit.  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 STABLE.  #PAD/Abdominal pain-likely secondary to mesenteric ischemia-status post PTCA balloon angioplasty; continue aspirin and Plavix as per vascular.  # pre-Syncopal episodes: random lightheaded; slouch to ground; at least a mins [4-5 times day]; headaches;  MRI of the brain- MAY 2023- NEGATIVE for metastases.  Labile blood pressures monitor for now.  Continue hold blood pressures medications.  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   #Hyponatremia sodium 132- STABLE.  # Back pain- MRI lumbar spine- July 2021-NEG for mets; severe arthritic changes; spinal canal stenosis-stable hydrocodone 1 day prn - STABLE.   # Poorly controlled diabetes blood sugars-better controlled as per patients-overall stable FBG-288-continue close follow-up with PCP.   #Prostatism symptoms: Continue Prazosin.   #  DISPOSITION:  # follow up in in 2-3 weeks-MD; labs- cbc/cmp; ca-19-9;CEA;  possible IVFs over 1 hours  Cc; [place]-     All questions were answered. The patient knows to call the clinic with any problems, questions or concerns.    Cammie Sickle, MD 12/21/2021 4:04 PM

## 2021-12-22 LAB — CEA: CEA: 10 ng/mL — ABNORMAL HIGH (ref 0.0–4.7)

## 2021-12-23 ENCOUNTER — Ambulatory Visit
Admission: RE | Admit: 2021-12-23 | Discharge: 2021-12-23 | Disposition: A | Discharge status: Home / Self Care | Payer: BC Managed Care – PPO | Source: Ambulatory Visit | Attending: Internal Medicine | Admitting: Internal Medicine

## 2021-12-23 DIAGNOSIS — K8689 Other specified diseases of pancreas: Secondary | ICD-10-CM | POA: Diagnosis present

## 2021-12-23 MED ORDER — GADOBUTROL 1 MMOL/ML IV SOLN
6.0000 mL | Freq: Once | INTRAVENOUS | Status: AC | PRN
  Administered 2021-12-23: 7.5 mL via INTRAVENOUS

## 2021-12-27 NOTE — Progress Notes (Signed)
I tried to reach the patient. Unable to reach/left voicemail regarding the results of the MRI. MRI concerning for lesion the pancreas. Will review  conference also plan for endoscopic ultrasound. Discussed with Hildred Alamin.

## 2021-12-28 ENCOUNTER — Inpatient Hospital Stay: Payer: BC Managed Care – PPO | Attending: Internal Medicine

## 2021-12-28 ENCOUNTER — Telehealth: Payer: Self-pay | Admitting: *Deleted

## 2021-12-28 DIAGNOSIS — C3432 Malignant neoplasm of lower lobe, left bronchus or lung: Secondary | ICD-10-CM | POA: Insufficient documentation

## 2021-12-28 DIAGNOSIS — N400 Enlarged prostate without lower urinary tract symptoms: Secondary | ICD-10-CM | POA: Insufficient documentation

## 2021-12-28 DIAGNOSIS — E119 Type 2 diabetes mellitus without complications: Secondary | ICD-10-CM | POA: Insufficient documentation

## 2021-12-28 DIAGNOSIS — C787 Secondary malignant neoplasm of liver and intrahepatic bile duct: Secondary | ICD-10-CM | POA: Insufficient documentation

## 2021-12-28 DIAGNOSIS — Z9221 Personal history of antineoplastic chemotherapy: Secondary | ICD-10-CM | POA: Insufficient documentation

## 2021-12-28 DIAGNOSIS — C182 Malignant neoplasm of ascending colon: Secondary | ICD-10-CM | POA: Insufficient documentation

## 2021-12-28 DIAGNOSIS — F17210 Nicotine dependence, cigarettes, uncomplicated: Secondary | ICD-10-CM | POA: Insufficient documentation

## 2021-12-28 DIAGNOSIS — Z794 Long term (current) use of insulin: Secondary | ICD-10-CM | POA: Insufficient documentation

## 2021-12-28 DIAGNOSIS — Z7951 Long term (current) use of inhaled steroids: Secondary | ICD-10-CM | POA: Insufficient documentation

## 2021-12-28 DIAGNOSIS — Z79899 Other long term (current) drug therapy: Secondary | ICD-10-CM | POA: Insufficient documentation

## 2021-12-28 DIAGNOSIS — Z7984 Long term (current) use of oral hypoglycemic drugs: Secondary | ICD-10-CM | POA: Insufficient documentation

## 2021-12-28 DIAGNOSIS — Z7902 Long term (current) use of antithrombotics/antiplatelets: Secondary | ICD-10-CM | POA: Insufficient documentation

## 2021-12-28 DIAGNOSIS — Z7982 Long term (current) use of aspirin: Secondary | ICD-10-CM | POA: Insufficient documentation

## 2021-12-28 DIAGNOSIS — Z9049 Acquired absence of other specified parts of digestive tract: Secondary | ICD-10-CM | POA: Insufficient documentation

## 2021-12-28 MED FILL — Dexamethasone Sodium Phosphate Inj 100 MG/10ML: INTRAMUSCULAR | Qty: 1 | Status: AC

## 2021-12-28 NOTE — Telephone Encounter (Signed)
Spoke with patient to inform that will be scheduled for EUS on 7/13 and that Steffanie Dunn will call him on Tuesday 7/11 to go over further instructions prior to procedure. Pt has been instructed to hold plavix and aspirin starting on Sat 7/8. Pt instructed to take last dose of plavix tomorrow and to hold until after his procedure. All questions answered during call. Instructed pt to call with any questions or needs. Pt verbalized understanding.   Gilmore Laroche in endoscopy made aware that pt will be scheduled for EUS on 7/13 and to keep spot available at this time. Informed that orders will be placed on Tues by Kristi.

## 2021-12-29 NOTE — Telephone Encounter (Addendum)
Spoke with Eulogio Ditch, NP at [facility] who advised that pt may hold plavix 5 days prior to biopsy but will need to continue taking aspirin 325mg  daily. Pt made aware to take last dose of plavix today 7/7 and start taking aspirin 325mg  starting tomorrow while holding plavix prior to EUS. Pt verbalized understanding and will call if has any further questions.

## 2022-01-02 ENCOUNTER — Telehealth: Payer: Self-pay

## 2022-01-02 ENCOUNTER — Other Ambulatory Visit: Payer: Self-pay

## 2022-01-02 NOTE — Telephone Encounter (Signed)
Kevin Mckay has received and reviewed instructions for EUS. He has been holding his plavix as advised. He has no further questions regarding EUS.

## 2022-01-04 ENCOUNTER — Ambulatory Visit: Payer: BC Managed Care – PPO | Admitting: General Practice

## 2022-01-04 ENCOUNTER — Other Ambulatory Visit: Payer: Self-pay

## 2022-01-04 ENCOUNTER — Encounter
Admission: RE | Disposition: A | Discharge status: Home / Self Care | Payer: Self-pay | Source: Home / Self Care | Attending: Internal Medicine

## 2022-01-04 ENCOUNTER — Encounter: Payer: Self-pay | Admitting: Internal Medicine

## 2022-01-04 ENCOUNTER — Ambulatory Visit
Admission: RE | Admit: 2022-01-04 | Discharge: 2022-01-04 | Disposition: A | Discharge status: Home / Self Care | Payer: BC Managed Care – PPO | Attending: Internal Medicine | Admitting: Internal Medicine

## 2022-01-04 DIAGNOSIS — C257 Malignant neoplasm of other parts of pancreas: Secondary | ICD-10-CM | POA: Diagnosis not present

## 2022-01-04 DIAGNOSIS — Z9221 Personal history of antineoplastic chemotherapy: Secondary | ICD-10-CM | POA: Diagnosis not present

## 2022-01-04 DIAGNOSIS — E871 Hypo-osmolality and hyponatremia: Secondary | ICD-10-CM | POA: Diagnosis not present

## 2022-01-04 DIAGNOSIS — E1151 Type 2 diabetes mellitus with diabetic peripheral angiopathy without gangrene: Secondary | ICD-10-CM | POA: Diagnosis not present

## 2022-01-04 DIAGNOSIS — K449 Diaphragmatic hernia without obstruction or gangrene: Secondary | ICD-10-CM | POA: Insufficient documentation

## 2022-01-04 DIAGNOSIS — K869 Disease of pancreas, unspecified: Secondary | ICD-10-CM | POA: Diagnosis present

## 2022-01-04 DIAGNOSIS — Z85038 Personal history of other malignant neoplasm of large intestine: Secondary | ICD-10-CM | POA: Diagnosis not present

## 2022-01-04 DIAGNOSIS — Z7982 Long term (current) use of aspirin: Secondary | ICD-10-CM | POA: Diagnosis not present

## 2022-01-04 DIAGNOSIS — C787 Secondary malignant neoplasm of liver and intrahepatic bile duct: Secondary | ICD-10-CM | POA: Diagnosis not present

## 2022-01-04 DIAGNOSIS — Z7984 Long term (current) use of oral hypoglycemic drugs: Secondary | ICD-10-CM | POA: Insufficient documentation

## 2022-01-04 DIAGNOSIS — Z7902 Long term (current) use of antithrombotics/antiplatelets: Secondary | ICD-10-CM | POA: Diagnosis not present

## 2022-01-04 DIAGNOSIS — T451X5A Adverse effect of antineoplastic and immunosuppressive drugs, initial encounter: Secondary | ICD-10-CM | POA: Diagnosis not present

## 2022-01-04 DIAGNOSIS — N400 Enlarged prostate without lower urinary tract symptoms: Secondary | ICD-10-CM | POA: Insufficient documentation

## 2022-01-04 DIAGNOSIS — R55 Syncope and collapse: Secondary | ICD-10-CM | POA: Insufficient documentation

## 2022-01-04 DIAGNOSIS — D6481 Anemia due to antineoplastic chemotherapy: Secondary | ICD-10-CM | POA: Diagnosis not present

## 2022-01-04 DIAGNOSIS — C182 Malignant neoplasm of ascending colon: Secondary | ICD-10-CM | POA: Diagnosis not present

## 2022-01-04 DIAGNOSIS — R109 Unspecified abdominal pain: Secondary | ICD-10-CM | POA: Diagnosis not present

## 2022-01-04 DIAGNOSIS — Z85118 Personal history of other malignant neoplasm of bronchus and lung: Secondary | ICD-10-CM | POA: Diagnosis not present

## 2022-01-04 DIAGNOSIS — Z923 Personal history of irradiation: Secondary | ICD-10-CM | POA: Insufficient documentation

## 2022-01-04 DIAGNOSIS — E1140 Type 2 diabetes mellitus with diabetic neuropathy, unspecified: Secondary | ICD-10-CM | POA: Diagnosis not present

## 2022-01-04 DIAGNOSIS — Z79899 Other long term (current) drug therapy: Secondary | ICD-10-CM | POA: Diagnosis not present

## 2022-01-04 DIAGNOSIS — M545 Low back pain, unspecified: Secondary | ICD-10-CM | POA: Diagnosis not present

## 2022-01-04 DIAGNOSIS — E1165 Type 2 diabetes mellitus with hyperglycemia: Secondary | ICD-10-CM | POA: Insufficient documentation

## 2022-01-04 HISTORY — PX: EUS: SHX5427

## 2022-01-04 SURGERY — UPPER ENDOSCOPIC ULTRASOUND (EUS) LINEAR
Anesthesia: General

## 2022-01-04 MED ORDER — PROPOFOL 1000 MG/100ML IV EMUL
INTRAVENOUS | Status: AC
  Filled 2022-01-04: qty 100

## 2022-01-04 MED ORDER — HEPARIN SOD (PORK) LOCK FLUSH 100 UNIT/ML IV SOLN
INTRAVENOUS | Status: AC
  Filled 2022-01-04: qty 5

## 2022-01-04 MED ORDER — HYDRALAZINE HCL 20 MG/ML IJ SOLN
INTRAMUSCULAR | Status: AC
  Filled 2022-01-04: qty 2

## 2022-01-04 MED ORDER — SODIUM CHLORIDE 0.9 % IV SOLN: INTRAVENOUS | Status: DC

## 2022-01-04 MED ORDER — LIDOCAINE HCL (CARDIAC) PF 100 MG/5ML IV SOSY
PREFILLED_SYRINGE | INTRAVENOUS | Status: DC | PRN
  Administered 2022-01-04: 50 mg via INTRAVENOUS

## 2022-01-04 MED ORDER — LIDOCAINE HCL (PF) 2 % IJ SOLN
INTRAMUSCULAR | Status: AC
  Filled 2022-01-04: qty 5

## 2022-01-04 MED ORDER — PROPOFOL 500 MG/50ML IV EMUL
INTRAVENOUS | Status: DC | PRN
  Administered 2022-01-04: 100 ug/kg/min via INTRAVENOUS

## 2022-01-04 MED ORDER — PROPOFOL 10 MG/ML IV BOLUS
INTRAVENOUS | Status: DC | PRN
  Administered 2022-01-04: 50 mg via INTRAVENOUS

## 2022-01-04 NOTE — Discharge Instructions (Signed)
Discharge home.

## 2022-01-04 NOTE — Op Note (Signed)
[facility]astroenterology Patient Name: Kevin Mckay Procedure Date: 01/04/2022 12:37 PM MRN: [id number] Account #: [id number] Date of Birth: May 04, 1951 Admit Type: Outpatient Age: 71 Room: [facility] ENDO ROOM 3 Gender: Male Note Status: Finalized Instrument Name: Upper Endoscope [id number] EUS Scope [id number] Procedure:             Upper EUS Indications:           Suspected mass in pancreas on MRI (recent CT scan with                         no mass and PET with minimal FDG avidity in the neck) Patient Profile:       Refer to note in patient chart for documentation of                         history and physical. Providers:             Murray Hodgkins. [place] Referring MD:          Charlaine Dalton (Referring MD) Medicines:             Propofol per Anesthesia Complications:         No immediate complications. Procedure:             Pre-Anesthesia Assessment:                        Prior to the procedure, a History and Physical was                         performed, and patient medications and allergies were                         reviewed. The patient is competent. The risks and                         benefits of the procedure and the sedation options and                         risks were discussed with the patient. All questions                         were answered and informed consent was obtained.                         Patient identification and proposed procedure were                         verified by the physician, the nurse and the                         anesthesiologist in the pre-procedure area. Mental                         Status Examination: alert and oriented. Airway                         Examination: normal oropharyngeal airway and neck  mobility. Respiratory Examination: clear to                         auscultation. CV Examination: normal. Prophylactic                         Antibiotics: The patient does not  require prophylactic                         antibiotics. Prior Anticoagulants: The patient has                         taken Plavix (clopidogrel), last dose was 6 days prior                         to procedure. ASA Grade Assessment: III                        A patient with severe systemic disease. After                         reviewing the risks and benefits, the patient was                         deemed in satisfactory condition to undergo the                         procedure. The anesthesia plan was to use monitored                         anesthesia care (MAC). Immediately prior to                         administration of medications, the patient was                         re-assessed for adequacy to receive sedatives. The                         heart rate, respiratory rate, oxygen saturations,                         blood pressure, adequacy of pulmonary ventilation, and                         response to care were monitored throughout the                         procedure. The physical status of the patient was                         re-assessed after the procedure.                        After obtaining informed consent, the endoscope was                         passed under direct vision. Throughout the procedure,  the patient's blood pressure, pulse, and oxygen                         saturations were monitored continuously. The Endoscope                         was introduced through the mouth, and advanced to the                         second part of duodenum. The Endoscope was introduced                         through the mouth, and advanced to the duodenum for                         ultrasound examination from the esophagus, stomach and                         duodenum. The upper EUS was accomplished without                         difficulty. The patient tolerated the procedure well. Findings:      ENDOSCOPIC FINDING: :      The examined  esophagus was endoscopically normal.      A small hiatal hernia was present.      The entire examined stomach was normal.      The examined duodenum was endoscopically normal.      ENDOSONOGRAPHIC FINDING: :      An ill-defined heterogeneous area was identified in the neck of the       pancreas. The area measured 20 mm by 21 mm in maximal cross-sectional       diameter. There was sonographic evidence suggesting abutment of the       portal vein and the splenoportal confluence. The remainder of the       pancreas was examined. The endosonographic appearance of parenchyma and       the upstream pancreatic duct indicated parenchymal atrophy. Fine needle       biopsy was performed. Color Doppler imaging was utilized prior to needle       puncture to confirm a lack of significant vascular structures within the       needle path. Three passes were made with the 25 gauge Medtronic       SharkCore biopsy needle using a transgastric approach. The cellularity       of the specimen was adequate. Final cytology results are pending.      Pancreatic parenchymal abnormalities were noted in the pancreatic body       and pancreatic tail. These consisted of atrophy, hyperechoic strands and       non-contiguous lobularity. The pancreas duct was normal in caliber.      Endosonographic imaging in the left lobe of the liver showed no       abnormalities.      The celiac region was visualized and showed no sign of significant       endosonographic abnormality. Impression:            EGD Impressions:                        - Normal esophagus.                        -  Small hiatal hernia.                        - Normal stomach.                        - Normal examined duodenum.                        EUS Impressions:                        - An ill-defined heterogeneous area was identified in                         the neck of the pancreas. Differential includes benign                         inflammatory  changes from chronic pancreatitis                         (especially in light of no upstream ductal dilation)                         vs malignancy. Fine needle biopsy performed.                        - Pancreatic parenchymal abnormalities consisting of                         atrophy, hyperechoic strands and non-contiguous                         lobularity were noted in the pancreatic body and                         pancreatic tail. Indeterminate features for chronic                         pancreatitis.                        - No pancreas duct dilation.                        - Normal visualized portions of the liver.                        - Normal celiac region. Recommendation:        - Discharge patient to home (ambulatory).                        - Await cytology results.                        - May resume Plavix in 72 hours.                        - The findings and recommendations were discussed with                         the patient.                        -  Return to referring physician as previously                         scheduled. Further plan of care to be determined by                         the referring medical oncology providers. Procedure Code(s):     --- Professional ---                        [phone], Esophagogastroduodenoscopy, flexible,                         transoral; with transendoscopic ultrasound-guided                         intramural or transmural fine needle                         aspiration/biopsy(s), (includes endoscopic ultrasound                         examination limited to the esophagus, stomach or                         duodenum, and adjacent structures) Diagnosis Code(s):     --- Professional ---                        R93.3, Abnormal findings on diagnostic imaging of                         other parts of digestive tract                        K86.89, Other specified diseases of pancreas                        K86.9, Disease of  pancreas, unspecified                        K44.9, Diaphragmatic hernia without obstruction or                         gangrene CPT copyright 2019 American Medical Association. All rights reserved. The codes documented in this report are preliminary and upon coder review may  be revised to meet current compliance requirements. Attending Participation:      I personally performed the entire procedure without the assistance of a       fellow, resident or surgical assistant. [place],  01/04/2022 1:39:41 PM This report has been signed electronically. Number of Addenda: 0 Note Initiated On: 01/04/2022 12:37 PM Estimated Blood Loss:  Estimated blood loss: none.      [facility]

## 2022-01-04 NOTE — Transfer of Care (Signed)
Immediate Anesthesia Transfer of Care Note  Patient: Kevin Mckay  Procedure(s) Performed: UPPER ENDOSCOPIC ULTRASOUND (EUS) LINEAR  Patient Location: PACU  Anesthesia Type:General  Level of Consciousness: sedated  Airway & Oxygen Therapy: Patient Spontanous Breathing and Patient connected to nasal cannula oxygen  Post-op Assessment: Report given to RN and Post -op Vital signs reviewed and stable  Post vital signs: Reviewed and stable  Last Vitals:  Vitals Value Taken Time  BP    Temp    Pulse    Resp    SpO2      Last Pain:  Vitals:   01/04/22 1158  PainSc: 0-No pain         Complications: No notable events documented.

## 2022-01-04 NOTE — Brief Op Note (Signed)
FNA pancreas neck mass sent with [facility]

## 2022-01-04 NOTE — Interval H&P Note (Signed)
History and Physical Interval Note:  01/04/2022 12:42 PM  Kevin Mckay  has presented today for surgery, with the diagnosis of pancreas mass.  The various methods of treatment have been discussed with the patient and family. After consideration of risks, benefits and other options for treatment, the patient has consented to  Procedure(s) with comments: UPPER ENDOSCOPIC ULTRASOUND (EUS) LINEAR (N/A) - [facility] as a surgical intervention.  The patient's history has been reviewed, patient examined, no change in status, stable for surgery.  I have reviewed the patient's chart and labs.  Questions were answered to the patient's satisfaction.     Tillie Rung

## 2022-01-04 NOTE — Anesthesia Postprocedure Evaluation (Signed)
Anesthesia Post Note  Patient: Kevin Mckay  Procedure(s) Performed: UPPER ENDOSCOPIC ULTRASOUND (EUS) LINEAR  Patient location during evaluation: Endoscopy Anesthesia Type: General Level of consciousness: awake and alert Pain management: pain level controlled Vital Signs Assessment: post-procedure vital signs reviewed and stable Respiratory status: spontaneous breathing, nonlabored ventilation, respiratory function stable and patient connected to nasal cannula oxygen Cardiovascular status: blood pressure returned to baseline and stable Postop Assessment: no apparent nausea or vomiting Anesthetic complications: no   No notable events documented.   Last Vitals:  Vitals:   01/04/22 1347 01/04/22 1357  BP: (!) 202/82 (!) 215/93  Pulse: 60 60  Resp: (!) 21 19  Temp:    SpO2: 100% 99%    Last Pain:  Vitals:   01/04/22 1327  TempSrc: Temporal  PainSc: 0-No pain                 Dimas Millin

## 2022-01-04 NOTE — Anesthesia Preprocedure Evaluation (Addendum)
Anesthesia Evaluation  Patient identified by MRN, date of birth, ID band Patient awake    Reviewed: Allergy & Precautions, NPO status , Patient's Chart, lab work & pertinent test results  Airway Mallampati: III  TM Distance: >3 FB Neck ROM: full    Dental  (+) Missing, Poor Dentition   Pulmonary asthma , Current Smoker,    Pulmonary exam normal        Cardiovascular hypertension, negative cardio ROS Normal cardiovascular exam     Neuro/Psych PSYCHIATRIC DISORDERS Depression negative neurological ROS     GI/Hepatic negative GI ROS, Neg liver ROS,   Endo/Other  negative endocrine ROSdiabetes  Renal/GU negative Renal ROS  negative genitourinary   Musculoskeletal negative musculoskeletal ROS (+)   Abdominal   Peds negative pediatric ROS (+)  Hematology negative hematology ROS (+)   Anesthesia Other Findings Past Medical History: No date: Asthma No date: Cancer ([place])     Comment:  Metastatic small cell lung cancer No date: Cerebral aneurysm No date: Chronic painful diabetic neuropathy (HCC) No date: Depression No date: Diabetes mellitus without complication (HCC) No date: Essential hypertension No date: History of kidney stones No date: Occasional tremors No date: Tobacco use  Past Surgical History: No date: ABDOMINAL SURGERY     Comment:  age 55. trauma surgery due to forklift injury- liver and              spleen 03/27/2019: COLONOSCOPY WITH PROPOFOL; N/A     Comment:  Procedure: COLONOSCOPY WITH PROPOFOL;  Surgeon: Jonathon Bellows, MD;  Location: [facility] ENDOSCOPY;  Service:               Gastroenterology;  Laterality: N/A; 12/02/2021: COLONOSCOPY WITH PROPOFOL; N/A     Comment:  Procedure: COLONOSCOPY WITH PROPOFOL;  Surgeon:               Lesly Rubenstein, MD;  Location: [facility] ENDOSCOPY;                Service: Endoscopy;  Laterality: N/A; 05/04/2019: COLOSTOMY REVISION; Right     Comment:   Procedure: COLON RESECTION RIGHT-right               hemicolectomy-open;  Surgeon: Fredirick Maudlin, MD;                Location: [facility] ORS;  Service: General;  Laterality:               Right; 12/02/2021: ESOPHAGOGASTRODUODENOSCOPY; N/A     Comment:  Procedure: ESOPHAGOGASTRODUODENOSCOPY (EGD);  Surgeon:               Lesly Rubenstein, MD;  Location: [facility] ENDOSCOPY;                Service: Endoscopy;  Laterality: N/A; 03/27/2019: ESOPHAGOGASTRODUODENOSCOPY (EGD) WITH PROPOFOL; N/A     Comment:  Procedure: ESOPHAGOGASTRODUODENOSCOPY (EGD) WITH               PROPOFOL;  Surgeon: Jonathon Bellows, MD;  Location: [facility]               ENDOSCOPY;  Service: Gastroenterology;  Laterality: N/A; 05/31/2016: IR GENERIC HISTORICAL     Comment:  IR ANGIO VERTEBRAL SEL VERTEBRAL BILAT MOD SED 05/31/2016              Consuella Lose, MD [facility]INTER[facility] RAD 05/31/2016: IR GENERIC HISTORICAL     Comment:  IR ANGIO INTRA  EXTRACRAN SEL INTERNAL CAROTID BILAT MOD               SED 05/31/2016 Consuella Lose, MD [facility] 02/04/2019: PORTACATH PLACEMENT; Right     Comment:  Procedure: INSERTION PORT-A-CATH;  Surgeon: [name], MD;  Location: [facility] ORS;  Service: General;                Laterality: Right; 01/22/2019: THORACOTOMY; Left     Comment:  Procedure: PRE OP BRONCH LEFT ANTERIOR THORACOTOMY WITH               BIOPSY OF MEDIASTINAL MASS;  Surgeon: [name], MD;               Location: [facility] ORS;  Service: General;  Laterality: Left; 12/18/2021: VISCERAL ANGIOGRAPHY; N/A     Comment:  Procedure: VISCERAL ANGIOGRAPHY;  Surgeon: Algernon Huxley,              MD;  Location: [facility];  Service:               Cardiovascular;  Laterality: N/A;   PMH of brain anyreusm  Reproductive/Obstetrics negative OB ROS                                                             Anesthesia Evaluation  Patient identified by MRN, date of birth, ID band Patient  awake    Reviewed: Allergy & Precautions, NPO status , Patient's Chart, lab work & pertinent test results  Airway Mallampati: III  TM Distance: >3 FB Neck ROM: full    Dental  (+) Chipped, Loose, Missing, Poor Dentition,    Pulmonary Current Smoker and Patient abstained from smoking.,  H/o small cell lung cancer status post chemo   emphesema   Pulmonary exam normal        Cardiovascular Exercise Tolerance: Poor METS: 3 - Mets hypertension, Pt. 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  Past Medical History: No date: Asthma No date: Cancer [facility])     Comment:  Metastatic small cell lung cancer No date: Cerebral aneurysm No date: Chronic painful diabetic neuropathy (HCC) No date: Depression No date: Diabetes mellitus without complication (HCC) No date: Essential hypertension No date: History of kidney stones No date: Occasional tremors No date: Tobacco use  Past Surgical History: No date: ABDOMINAL SURGERY     Comment:  age 71. trauma surgery due to forklift injury- liver and              spleen 03/27/2019: COLONOSCOPY WITH PROPOFOL; N/A     Comment:  Procedure: COLONOSCOPY WITH PROPOFOL;  Surgeon: Jonathon Bellows, MD;  Location: [facility] ENDOSCOPY;  Service:               Gastroenterology;  Laterality: N/A; 05/04/2019: COLOSTOMY  REVISION; Right     Comment:  Procedure: COLON RESECTION RIGHT-right               hemicolectomy-open;  Surgeon: Fredirick Maudlin, MD;                Location: [facility] ORS;  Service: General;  Laterality:               Right; 03/27/2019: ESOPHAGOGASTRODUODENOSCOPY (EGD) WITH PROPOFOL; N/A     Comment:  Procedure: ESOPHAGOGASTRODUODENOSCOPY (EGD) WITH               PROPOFOL;  Surgeon: Jonathon Bellows, MD;  Location: [facility]               ENDOSCOPY;  Service: Gastroenterology;  Laterality: N/A; 05/31/2016: IR GENERIC HISTORICAL     Comment:  IR ANGIO VERTEBRAL SEL VERTEBRAL BILAT MOD SED 05/31/2016              Consuella Lose, MD [facility] 05/31/2016: IR GENERIC HISTORICAL     Comment:  IR ANGIO INTRA EXTRACRAN SEL INTERNAL CAROTID BILAT MOD               SED 05/31/2016 Consuella Lose, MD [facility] 02/04/2019: PORTACATH PLACEMENT; Right     Comment:  Procedure: INSERTION PORT-A-CATH;  Surgeon: [name], MD;  Location: [facility] ORS;  Service: General;                Laterality: Right; 01/22/2019: THORACOTOMY; Left     Comment:  Procedure: PRE OP BRONCH LEFT ANTERIOR THORACOTOMY WITH               BIOPSY OF MEDIASTINAL MASS;  Surgeon: [name], MD;               Location: [facility] ORS;  Service: General;  Laterality: Left;  BMI    Body Mass Index: 19.31 kg/m      Reproductive/Obstetrics negative OB ROS                            Anesthesia Physical Anesthesia Plan  ASA: 3  Anesthesia Plan: General   Post-op Pain Management: Minimal or no pain anticipated   Induction: Intravenous  PONV Risk Score and Plan: Propofol infusion and TIVA  Airway Management Planned: Natural Airway  Additional Equipment:   Intra-op Plan:   Post-operative Plan:   Informed Consent: I have reviewed the patients History and Physical, chart, labs and discussed the procedure including the risks, benefits and alternatives for the proposed anesthesia with the  patient or authorized representative who has indicated his/her understanding and acceptance.     Dental Advisory Given  Plan Discussed with: Anesthesiologist, CRNA and Surgeon  Anesthesia Plan Comments:        Anesthesia Quick Evaluation  Anesthesia Physical Anesthesia Plan  ASA: 2  Anesthesia Plan: General ETT and General   Post-op Pain Management:    Induction: Intravenous  PONV Risk Score and Plan: 2 and Ondansetron, Dexamethasone, Midazolam and Treatment may vary due to age or medical condition  Airway Management Planned: Oral ETT  Additional Equipment:   Intra-op Plan:  Post-operative Plan: Extubation in OR  Informed Consent: I have reviewed the patients History and Physical, chart, labs and discussed the procedure including the risks, benefits and alternatives for the proposed anesthesia with the patient or authorized representative who has indicated his/her understanding and acceptance.     Dental Advisory Given  Plan Discussed with: Anesthesiologist, CRNA and Surgeon  Anesthesia Plan Comments: (Patient consented for risks of anesthesia including but not limited to:  - adverse reactions to medications - damage to eyes, teeth, lips or other oral mucosa - nerve damage due to positioning  - sore throat or hoarseness - Damage to heart, brain, nerves, lungs, other parts of body or loss of life  Patient voiced understanding.)       Anesthesia Quick Evaluation

## 2022-01-05 ENCOUNTER — Telehealth: Payer: Self-pay | Admitting: Internal Medicine

## 2022-01-05 ENCOUNTER — Inpatient Hospital Stay: Payer: BC Managed Care – PPO

## 2022-01-05 ENCOUNTER — Inpatient Hospital Stay: Payer: BC Managed Care – PPO | Admitting: Internal Medicine

## 2022-01-05 ENCOUNTER — Encounter: Payer: Self-pay | Admitting: Internal Medicine

## 2022-01-05 DIAGNOSIS — N400 Enlarged prostate without lower urinary tract symptoms: Secondary | ICD-10-CM | POA: Diagnosis not present

## 2022-01-05 DIAGNOSIS — Z7982 Long term (current) use of aspirin: Secondary | ICD-10-CM | POA: Diagnosis not present

## 2022-01-05 DIAGNOSIS — C182 Malignant neoplasm of ascending colon: Secondary | ICD-10-CM

## 2022-01-05 DIAGNOSIS — Z9049 Acquired absence of other specified parts of digestive tract: Secondary | ICD-10-CM | POA: Diagnosis not present

## 2022-01-05 DIAGNOSIS — Z7902 Long term (current) use of antithrombotics/antiplatelets: Secondary | ICD-10-CM | POA: Diagnosis not present

## 2022-01-05 DIAGNOSIS — E119 Type 2 diabetes mellitus without complications: Secondary | ICD-10-CM | POA: Diagnosis not present

## 2022-01-05 DIAGNOSIS — Z7951 Long term (current) use of inhaled steroids: Secondary | ICD-10-CM | POA: Diagnosis not present

## 2022-01-05 DIAGNOSIS — Z794 Long term (current) use of insulin: Secondary | ICD-10-CM | POA: Diagnosis not present

## 2022-01-05 DIAGNOSIS — Z79899 Other long term (current) drug therapy: Secondary | ICD-10-CM | POA: Diagnosis not present

## 2022-01-05 DIAGNOSIS — Z9221 Personal history of antineoplastic chemotherapy: Secondary | ICD-10-CM | POA: Diagnosis not present

## 2022-01-05 DIAGNOSIS — Z7984 Long term (current) use of oral hypoglycemic drugs: Secondary | ICD-10-CM | POA: Diagnosis not present

## 2022-01-05 DIAGNOSIS — C3432 Malignant neoplasm of lower lobe, left bronchus or lung: Secondary | ICD-10-CM | POA: Diagnosis not present

## 2022-01-05 DIAGNOSIS — C787 Secondary malignant neoplasm of liver and intrahepatic bile duct: Secondary | ICD-10-CM | POA: Diagnosis present

## 2022-01-05 DIAGNOSIS — F17210 Nicotine dependence, cigarettes, uncomplicated: Secondary | ICD-10-CM | POA: Diagnosis not present

## 2022-01-05 MED ORDER — LISINOPRIL-HYDROCHLOROTHIAZIDE 20-12.5 MG PO TABS: 1.0000 | ORAL_TABLET | Freq: Every day | ORAL | 1 refills | Status: AC

## 2022-01-05 MED ORDER — AMLODIPINE BESYLATE 5 MG PO TABS
5.0000 mg | ORAL_TABLET | Freq: Every day | ORAL | 1 refills | Status: AC
Start: 2022-01-05 — End: ?

## 2022-01-05 NOTE — Progress Notes (Signed)
Patient had EUS yesterday and Plavix is on hold until tomorrow.  Decreased appetite with 9 lb wt loss since 12/21/21 drinks 1 Ensure a day.  Did not want labs drawn today and feels that he does not need IVF today.    Bp today 205/86, 221/88.  Has not taken bp meds today yet.

## 2022-01-05 NOTE — Telephone Encounter (Signed)
Pt called to cancel appt for this morning due to having port flush at surgery center yesterday. He wanted to know if he could just reschedule his appt to a later date because someone at the surgery center told him he did not need another port flush for a while.

## 2022-01-05 NOTE — Telephone Encounter (Addendum)
Patient was scheduled for MD/IVF today not just a port flush.

## 2022-01-05 NOTE — Assessment & Plan Note (Addendum)
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  CT scan -JUNE 9th, 2023-no evidence of progression of subcapsular liver lesion/biopsy-proven colon cancer; no evidence of new disease; however subsequent CT done for mesenteric ischemia noted to have a peripancreatic mass [ hypoenhancement involving the neck of the pancreas measuring 3.0 x 2.8 by 2.4 cm].  See below.  #Incidental pancreatic mass -December 18, 2021 PET scan:  Vague mild hypermetabolism (max SUV 3.0) in the pancreatic neck.  Abdomen MRI with and without contrast- July 1st, 2023-approximately 3 cm mass in the neck of the pancreas highly concerning for malignancy.  Again discussed the differential diagnosis includes-benign versus malignant [metastatic disease from small cell lung cancer/or colon cancer versus primary pancreatic cancer]s/p  endoscopic ultrasound fon July 13th-PENDING.  CA 19-9 slightly elevated.  Patient declined repeat blood work today.  #Poorly controlled blood pressures-restart blood pressure medication. Called in scripts for Amlodpine 5 mg; lisinopril 20-HCTZ. Recommend checking BPs at home/follow up with PCP.   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 STABLE  #PAD/Abdominal pain-likely secondary to mesenteric ischemia-status post PTCA balloon angioplasty; continue aspirin and Plavix as per vascular [s/p EUS on 7/13-re-start in 3 days]-overall improved/stable.  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   # Back pain- MRI lumbar spine- July 2021-NEG for mets; severe arthritic changes; spinal canal stenosis-stable hydrocodone 1 day prn - STABLE.   # Poorly controlled diabetes blood sugars-better  controlled as per patients-overall stable [id number]-continue close follow-up with PCP- STABLE.   # Active smoker: 2-3 packs cigarettes a day [rolls]-recommend quitting smoking.  Discussed at length regarding his multiple medical problems complicated by smoking.  Does not seem to be interested in quitting at this time.  #Prostatism symptoms: Continue Prazosin.   # DISPOSITION:  # NO labs needed today; NO IVFs   # follow up in in 2 months -MD; labs- cbc/cmp; ca-19-9; CEA;  possible IVFs over 1 hours-Dr.B  Cc; [place]-

## 2022-01-05 NOTE — Progress Notes (Signed)
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  MRI brain negative  # aug 17th 2020-carbo etoposide-RT [? 8/19]; #4 cycle carbo-Etop [finished dec 30th,2020]  #August 2020 iron deficient anemia-question etiology; IV Feraheme [no colonoscopy]  # OCT 2020- colo/ Cecal adeno ca; MRI liver- 1 cm enhancing lesion "metastasis" [Dr.Anna/Dr.Cannon]-difficulty biopsy.  November 02-2019 right hemicolectomy- STAGE III [pT3pN1 (2/13LN)]- HOLD adjuvant therapy. MMR- INTACT/LOW  #May 2021-liver biopsy-possible adenocarcinoma; colorectal origin.  Stage IV colon cancer  #May 24-2021-FOLFOX with Avastin; AUG 31st,2021- STABLE liver lesion; SEP 1st, 2021- Discontinue LV+Ox sec to severe fatigue/PN; cont 5FU CIV + Avastin   #Incidental pancreatic mass -December 18, 2021 PET scan:  Vague mild hypermetabolism (max SUV 3.0) in the pancreatic neck.  Abdomen MRI with and without contrast- July 1st, 2023-approximately 3 cm mass in the neck of the pancreas highly concerning for malignancy.  Again discussed the differential diagnosis includes-benign versus malignant [metastatic disease from small cell lung cancer/or colon cancer versus primary pancreatic cancer]s/p  endoscopic ultrasound fon July 13th-PENDING.  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 ([place])  01/29/2019 Initial Diagnosis   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 [facility])   02/09/2019 - 06/24/2019 Chemotherapy   The patient had palonosetron (ALOXI) injection 0.25 mg, 0.25 mg, Intravenous,  Once, 4 of 4 cycles Administration: 0.25 mg (02/09/2019), 0.25 mg (03/03/2019), 0.25 mg (06/02/2019), 0.25 mg (06/22/2019) CARBOplatin (PARAPLATIN) 410 mg in sodium chloride 0.9 % 250 mL chemo infusion, 410 mg (100 % of original dose 414 mg), Intravenous,  Once, 4 of 4 cycles Dose modification:   (original dose 414 mg, Cycle 1) Administration: 410 mg (02/09/2019), 410 mg (03/03/2019), 410 mg (06/02/2019), 410 mg (06/22/2019) etoposide (VEPESID) 180 mg in sodium chloride 0.9 % 500 mL chemo infusion, 100 mg/m2 = 180 mg, Intravenous,  Once, 4 of 4 cycles Administration: 180 mg (02/09/2019), 180 mg (02/10/2019), 180 mg (02/11/2019), 180 mg (03/03/2019), 180 mg (03/04/2019), 180 mg (03/05/2019), 180 mg (06/02/2019), 180 mg (06/03/2019), 180 mg (06/04/2019), 180 mg (06/22/2019), 180 mg (06/23/2019), 180 mg (06/24/2019) fosaprepitant (EMEND) 150 mg, dexamethasone (DECADRON) 6 mg in sodium chloride 0.9 % 145 mL IVPB, , Intravenous,  Once, 2 of 2 cycles Administration:  (06/02/2019),  (06/22/2019)  for chemotherapy treatment.    Cancer of right colon ([place])  05/04/2019 Initial Diagnosis   Cancer of ascending colon ([place])   11/30/2019 -  Chemotherapy    Patient is on Treatment Plan: COLORECTAL FOLFOX + BEVACIZUMAB Q14D  HISTORY OF PRESENTING  ILLNESS: Accompanied by his wife.  Ambulating independently.  Kevin Mckay 71 y.o.  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  In the interim patient underwent endoscopic ultrasound for a possible pancreatic mass yesterday.   In the interim patient underwent angiogram of the inferior mesenteric artery/PTCA; bilateral femoral arteries.  Notes improvement of his abdominal pain.  Appetite is still poor.  States his blood pressures are running in 170s at home.  Has been off all blood pressure medications given his previous hypotension.  Review of Systems  Constitutional:  Positive for malaise/fatigue and weight loss. Negative for chills, diaphoresis and fever.  HENT:  Negative for nosebleeds and sore throat.   Eyes:  Negative for double vision.  Respiratory:  Negative for hemoptysis and wheezing.   Cardiovascular:  Negative for chest pain, palpitations and orthopnea.  Gastrointestinal:  Negative for abdominal pain, blood in stool, constipation, diarrhea, heartburn, melena, nausea and vomiting.  Genitourinary:  Negative for dysuria, frequency and urgency.  Musculoskeletal:  Positive for back pain and joint pain.  Skin: Negative.  Negative for itching and rash.  Neurological:  Positive for tingling. Negative for dizziness, focal weakness and weakness.  Endo/Heme/Allergies:  Does not bruise/bleed easily.  Psychiatric/Behavioral:  Negative for depression. The patient is not nervous/anxious and does not have insomnia.      MEDICAL HISTORY:  Past Medical History:  Diagnosis Date   Asthma    Cancer ([place])    Metastatic small cell lung cancer   Cerebral aneurysm    Chronic painful diabetic neuropathy (HCC)    Depression    Diabetes mellitus without complication ([place])    Essential hypertension    History of kidney  stones    Occasional tremors    Tobacco use     SURGICAL HISTORY: Past Surgical History:  Procedure Laterality Date   ABDOMINAL SURGERY     age 33. trauma surgery due to forklift injury- liver and spleen   COLONOSCOPY WITH PROPOFOL N/A 03/27/2019   Procedure: COLONOSCOPY WITH PROPOFOL;  Surgeon: Jonathon Bellows, MD;  Location: [facility] ENDOSCOPY;  Service: Gastroenterology;  Laterality: N/A;   COLONOSCOPY WITH PROPOFOL N/A 12/02/2021   Procedure: COLONOSCOPY WITH PROPOFOL;  Surgeon: Lesly Rubenstein, MD;  Location: [facility] ENDOSCOPY;  Service: Endoscopy;  Laterality: N/A;   COLOSTOMY REVISION Right 05/04/2019   Procedure: COLON RESECTION RIGHT-right hemicolectomy-open;  Surgeon: Fredirick Maudlin, MD;  Location: [facility] ORS;  Service: General;  Laterality: Right;   ESOPHAGOGASTRODUODENOSCOPY N/A 12/02/2021   Procedure: ESOPHAGOGASTRODUODENOSCOPY (EGD);  Surgeon: Lesly Rubenstein, MD;  Location: [facility] ENDOSCOPY;  Service: Endoscopy;  Laterality: N/A;   ESOPHAGOGASTRODUODENOSCOPY (EGD) WITH PROPOFOL N/A 03/27/2019   Procedure: ESOPHAGOGASTRODUODENOSCOPY (EGD) WITH PROPOFOL;  Surgeon: Jonathon Bellows, MD;  Location: [facility] ENDOSCOPY;  Service: Gastroenterology;  Laterality: N/A;   IR GENERIC HISTORICAL  05/31/2016   IR ANGIO VERTEBRAL SEL VERTEBRAL BILAT MOD SED 05/31/2016 Consuella Lose, MD [facility]-INTER[facility] RAD   IR GENERIC HISTORICAL  05/31/2016   IR ANGIO INTRA EXTRACRAN SEL INTERNAL CAROTID BILAT MOD SED 05/31/2016 Consuella Lose, MD [facility]   PORTACATH PLACEMENT Right 02/04/2019   Procedure: INSERTION PORT-A-CATH;  Surgeon: [name], MD;  Location: [facility] ORS;  Service: General;  Laterality: Right;   THORACOTOMY Left 01/22/2019   Procedure: PRE OP BRONCH LEFT ANTERIOR THORACOTOMY WITH BIOPSY OF MEDIASTINAL MASS;  Surgeon: [name], MD;  Location: [facility] ORS;  Service: General;  Laterality: Left;   VISCERAL ANGIOGRAPHY N/A 12/18/2021   Procedure: VISCERAL ANGIOGRAPHY;  Surgeon: Algernon Huxley, MD;   Location: [facility] CV LAB;  Service: Cardiovascular;  Laterality: N/A;    SOCIAL HISTORY: Social History   Socioeconomic History   Marital status: Married    Spouse name: Not on file   Number of children: Not on file   Years of education: Not on file   Highest education level: Not on file  Occupational History   Not on file  Tobacco Use   Smoking status: Every Day    Packs/day: 0.50    Years: 50.00    Total pack years: 25.00    Types: Cigarettes   Smokeless tobacco: Never  Vaping Use   Vaping Use: Never used  Substance and Sexual Activity   Alcohol use: No   Drug use: Yes    Types: Barbituates, Marijuana    Comment: yesterday   Sexual activity: Not Currently  Other Topics Concern   Not on file  Social History Narrative   Lives in [place]; wife/ son/grandson [custody]; smoker; vending business; hx of alcoholism- quit at 78.    Social Determinants of Health   Financial Resource Strain: Low Risk  (05/04/2019)   Overall Financial Resource Strain (CARDIA)    Difficulty of Paying Living Expenses: Not hard at all  Food Insecurity: No Food Insecurity (05/04/2019)   Hunger Vital Sign    Worried About Running Out of Food in the Last Year: Never true    Ran Out of Food in the Last Year: Never true  Transportation Needs: No Transportation Needs (05/04/2019)   PRAPARE - Hydrologist (Medical): No    Lack of Transportation (Non-Medical): No  Physical Activity: Inactive (05/04/2019)   Exercise Vital Sign    Days of Exercise per Week: 0 days    Minutes of Exercise per Session: 0 min  Stress: No Stress Concern Present (05/04/2019)   [place]    Feeling of Stress : Not at all  Social Connections: Unknown (05/04/2019)   Social Connection and Isolation Panel [NHANES]    Frequency of Communication with Friends and Family: Patient refused    Frequency of Social Gatherings with Friends  and Family: Patient refused    Attends Religious Services: Patient refused    Active Member of Clubs or Organizations: Patient refused    Attends Archivist Meetings: Patient refused    Marital Status: Patient refused  Intimate Partner Violence: Not At Risk (05/04/2019)   Humiliation, Afraid, Rape, and Kick questionnaire    Fear of Current or Ex-Partner: No    Emotionally Abused: No    Physically Abused: No    Sexually Abused: No    FAMILY HISTORY: Family History  Problem Relation Age of Onset   Lymphoma Father    Liver cancer Paternal Uncle    Lung cancer Paternal Uncle    Lung cancer Maternal Uncle     ALLERGIES:  is allergic to ferumoxytol, amoxicillin, and codeine.  MEDICATIONS:  Current Outpatient Medications  Medication Sig Dispense Refill   amLODipine (NORVASC) 5 MG tablet Take 1 tablet (5 mg total) by mouth daily. 30 tablet 1   aspirin EC 81 MG tablet Take 1 tablet (81 mg total) by mouth daily. Swallow whole. 150 tablet 2   atorvastatin (LIPITOR) 10 MG tablet Take 1 tablet (10 mg total) by mouth daily. [place]  tablet 11   budesonide-formoterol (SYMBICORT) 160-4.5 MCG/ACT inhaler Inhale 2 puffs into the lungs 2 (two) times daily.     clopidogrel (PLAVIX) 75 MG tablet Take 1 tablet (75 mg total) by mouth daily. 30 tablet 6   DULoxetine (CYMBALTA) 30 MG capsule Take 90 mg by mouth every morning.     gabapentin (NEURONTIN) 800 MG tablet Take 800 mg by mouth 3 (three) times daily.     HYDROcodone-acetaminophen (NORCO/VICODIN) 5-325 MG tablet Take 1 tablet by mouth every 12 (twelve) hours as needed for moderate pain. 60 tablet 0   insulin glargine (LANTUS) 100 UNIT/ML injection Inject 10 Units into the skin 2 (two) times daily. Morning and Evening if Blood Sugar is over 150     Ipratropium-Albuterol (COMBIVENT) 20-100 MCG/ACT AERS respimat Inhale 1 puff into the lungs every 6 (six) hours as needed.     lidocaine-prilocaine (EMLA) cream Apply 1 application. topically as  needed. 30-45 mins prior to port access. 30 g 0   lisinopril-hydrochlorothiazide (ZESTORETIC) 20-12.5 MG tablet Take 1 tablet by mouth daily. 30 tablet 1   metFORMIN (GLUCOPHAGE) 1000 MG tablet Take 500 mg by mouth. 2 in the a.m, 1 at noon, 1 at p.m     prazosin (MINIPRESS) 1 MG capsule Take 1 mg by mouth at bedtime.     pyridOXINE (VITAMIN B-6) 100 MG tablet Take 100 mg by mouth daily.     sitaGLIPtin (JANUVIA) 25 MG tablet Take 25 mg by mouth daily.     sodium chloride 1 g tablet Take 1 g by mouth 3 (three) times daily with meals.     No current facility-administered medications for this visit.   Facility-Administered Medications Ordered in Other Visits  Medication Dose Route Frequency Provider Last Rate Last Admin   heparin lock flush 100 UNIT/ML injection            heparin lock flush 100 UNIT/ML injection            sodium chloride flush (NS) 0.9 % injection 10 mL  10 mL Intravenous PRN Charlaine Dalton R, MD   10 mL at 09/13/20 0943   sodium chloride flush (NS) 0.9 % injection 10 mL  10 mL Intravenous PRN Cammie Sickle, MD   10 mL at 07/13/21 0948      .  PHYSICAL EXAMINATION: ECOG PERFORMANCE STATUS: 0 - Asymptomatic  Vitals:   01/05/22 0900  BP: (!) 205/86  Pulse: 67  Resp: 20  Temp: (!) 97.2 F (36.2 C)   Filed Weights   01/05/22 0900  Weight: 130 lb 3.2 oz (59.1 kg)    Physical Exam HENT:     Head: Normocephalic and atraumatic.     Mouth/Throat:     Pharynx: No oropharyngeal exudate.  Eyes:     Pupils: Pupils are equal, round, and reactive to light.  Cardiovascular:     Rate and Rhythm: Normal rate and regular rhythm.  Pulmonary:     Effort: No respiratory distress.     Breath sounds: No wheezing.  Abdominal:     General: Bowel sounds are normal. There is no distension.     Palpations: Abdomen is soft. There is no mass.     Tenderness: There is no abdominal tenderness. There is no guarding or rebound.     Comments: Abdominal incision  well-healed.  Musculoskeletal:        General: No tenderness. Normal range of motion.     Cervical back: Normal range of motion and neck  supple.  Skin:    General: Skin is warm.  Neurological:     Mental Status: He is alert and oriented to person, place, and time.  Psychiatric:        Mood and Affect: Affect normal.     LABORATORY DATA:  I have reviewed the data as listed Lab Results  Component Value Date   WBC 7.9 12/21/2021   HGB 10.3 (L) 12/21/2021   HCT 31.0 (L) 12/21/2021   MCV 84.5 12/21/2021   PLT 169 12/21/2021   Recent Labs    12/01/21 0641 12/02/21 0614 12/08/21 0853 12/21/21 1046  NA 138 137 133* 131*  K 4.2 3.8 4.2 4.8  CL 108 107 98 99  CO2 '24 24 26 25  ' GLUCOSE 141* 152* 210* 289*  BUN '18 10 11 ' 24*  CREATININE 0.98 0.62 0.88 1.49*  CALCIUM 9.1 8.8* 9.0 8.9  GFRNONAA >60 >60 >60 50*  PROT 6.8  --  6.9 6.8  ALBUMIN 3.7  --  3.5 3.7  AST 21  --  14* 20  ALT 14  --  14 17  ALKPHOS 49  --  53 59  BILITOT 0.6  --  0.5 0.5    RADIOGRAPHIC STUDIES: I have personally reviewed the radiological images as listed and agreed with the findings in the report. MR Abdomen W Wo Contrast  Result Date: 12/24/2021 CLINICAL DATA:  Follow-up pancreatic lesion. EXAM: MRI ABDOMEN WITHOUT AND WITH CONTRAST TECHNIQUE: Multiplanar multisequence MR imaging of the abdomen was performed both before and after the administration of intravenous contrast. CONTRAST:  7.81m GADAVIST GADOBUTROL 1 MMOL/ML IV SOLN COMPARISON:  12/02/2021 and 03/10/2021 FINDINGS: Lower chest: No acute findings. Hepatobiliary: No hepatic masses identified. Gallbladder is unremarkable. No evidence of biliary ductal dilatation. Pancreas: Diffuse atrophy pancreatic body and tail is again noted. A heterogeneously enhancing mass is seen in the pancreatic neck which measures 3.4 x 2.6 cm on image 33/19, similar in size to previous study but new since 03/10/2021. This is highly suspicious for pancreatic carcinoma. This  mass abuts the portal venous confluence, and encases and narrows the proximal main portal vein. Spleen:  Within normal limits in size and appearance. Adrenals/Urinary Tract: No masses identified. No evidence of hydronephrosis. Stomach/Bowel: Unremarkable. Vascular/Lymphatic: No pathologically enlarged lymph nodes identified. No acute vascular findings. Chronic portosystemic venous collaterals are seen gastrosplenic and gastrocolic ligaments. Other:  None. Musculoskeletal:  No suspicious bone lesions identified. IMPRESSION: 3.4 cm mass in the pancreatic neck, highly suspicious for pancreatic carcinoma. This mass abuts the portal venous confluence, and encases and narrows the proximal main portal vein. EUS/FNA should be considered further evaluation. No evidence of abdominal metastatic disease. Electronically Signed   By: JMarlaine HindM.D.   On: 12/24/2021 16:33   PERIPHERAL VASCULAR CATHETERIZATION  Result Date: 12/18/2021 See surgical note for result.  NM PET Image Restage (PS) Skull Base to Thigh (F-18 FDG)  Result Date: 12/14/2021 CLINICAL DATA:  Subsequent treatment strategy for small cell left lung cancer and additional history of right colon cancer. Concern for pancreatic neck mass on recent CT. EXAM: NUCLEAR MEDICINE PET SKULL BASE TO THIGH TECHNIQUE: 6.6 mCi F-18 FDG was injected intravenously. Full-ring PET imaging was performed from the skull base to thigh after the radiotracer. CT data was obtained and used for attenuation correction and anatomic localization. Fasting blood glucose: 197 mg/dl COMPARISON:  12/02/2021 CT angiogram of the abdomen and pelvis. 10/04/2021 CT chest, abdomen and pelvis. 12/25/2018 PET-CT. 10/21/2019 MRI abdomen. FINDINGS: Mediastinal blood  pool activity: SUV max 2.1 Liver activity: SUV max NA NECK: No hypermetabolic lymph nodes in the neck. Incidental CT findings: Inferior right maxillary sinus 1.6 cm mucous retention cyst versus polyp. Partial opacification of the right  mastoid air cells. CHEST: No enlarged or hypermetabolic axillary, mediastinal or hilar lymph nodes. Sharply marginated mid to upper left perihilar consolidation with associated mild volume loss and distortion and with associated low level generalized FDG uptake with max SUV 2.3, compatible with radiation fibrosis. No focal pulmonary hypermetabolism to suggest tumor recurrence or metastatic disease. Incidental CT findings: Right subclavian Port-A-Cath terminates in the lower third of the SVC. Atherosclerotic nonaneurysmal thoracic aorta. Right upper lobe 0.4 cm solid pulmonary nodule (series 2/image 73), below PET resolution, not definitely seen on prior CT studies. Coronary atherosclerosis. ABDOMEN/PELVIS: Indistinct 0.7 cm subcapsular hypodense peripheral segment 8 right liver lesion (series 2/image 121), unchanged on the CT images from 11/30/2021 CT and without hypermetabolism. Vague mild hypermetabolism (max SUV 3.0) in the pancreatic neck at the recently questioned focus of hypoenhancement on 12/02/2021 CT angiogram abdomen study. Chronic atrophy of the pancreatic body and tail. No abnormal hypermetabolic activity within the liver, adrenal glands, or spleen. No hypermetabolic lymph nodes in the abdomen or pelvis. Incidental CT findings: Cholecystectomy. Atherosclerotic nonaneurysmal abdominal aorta. Stable postsurgical changes from subtotal right hemicolectomy. Marked sigmoid diverticulosis. Moderate colorectal stool. SKELETON: No focal hypermetabolic activity to suggest skeletal metastasis. Incidental CT findings: none IMPRESSION: 1. Vague mild hypermetabolism (max SUV 3.0) in the pancreatic neck at the recently questioned focus of hypoenhancement on 12/02/2021 CT angiogram abdomen/pelvis study. Imaging findings remain equivocal, with primary pancreatic malignancy not excluded in this location. MRI abdomen without and with IV contrast is the best imaging test for evaluation of the pancreas and is suggested at  this time. 2. Stable left upper perihilar radiation fibrosis without evidence of local tumor recurrence. 3. Otherwise no hypermetabolic findings to suggest metastatic disease. Indistinct subcentimeter hypodense peripheral right liver subcapsular lesion is unchanged on the CT images from 11/30/2021 CT and is non hypermetabolic, compatible with treated metastasis. 4. Tiny 0.4 cm right upper lobe pulmonary nodule is below PET resolution and not definitely seen on prior CT studies. Suggest attention on follow-up chest CT in 3 months. 5. Chronic findings include: Aortic Atherosclerosis (ICD10-I70.0). Coronary atherosclerosis. Marked sigmoid diverticulosis. Electronically Signed   By: Ilona Sorrel M.D.   On: 12/14/2021 13:24    ASSESSMENT & PLAN:   Cancer of right colon ([place]) # Right colon cancer stage IV- liver metastases; adenocarcinoma; most recently on 5FU + avastin-chemotherapy currently on hold -because of mild to moderate intolerance/stability disease/patient preference.  CT scan -JUNE 9th, 2023-no evidence of progression of subcapsular liver lesion/biopsy-proven colon cancer; no evidence of new disease; however subsequent CT done for mesenteric ischemia noted to have a peripancreatic mass [ hypoenhancement involving the neck of the pancreas measuring 3.0 x 2.8 by 2.4 cm].  See below.  #Incidental pancreatic mass -December 18, 2021 PET scan:  Vague mild hypermetabolism (max SUV 3.0) in the pancreatic neck.  Abdomen MRI with and without contrast- July 1st, 2023-approximately 3 cm mass in the neck of the pancreas highly concerning for malignancy.  Again discussed the differential diagnosis includes-benign versus malignant [metastatic disease from small cell lung cancer/or colon cancer versus primary pancreatic cancer]s/p  endoscopic ultrasound fon July 13th-PENDING.  CA 19-9 slightly elevated.  Patient declined repeat blood work today.  #Poorly controlled blood pressures-restart blood pressure medication. Called  in scripts for Amlodpine 5 mg;  lisinopril 20-HCTZ. Recommend checking BPs at home/follow up with PCP.   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 STABLE  #PAD/Abdominal pain-likely secondary to mesenteric ischemia-status post PTCA balloon angioplasty; continue aspirin and Plavix as per vascular [s/p EUS on 7/13-re-start in 3 days]-overall improved/stable.  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   # Back pain- MRI lumbar spine- July 2021-NEG for mets; severe arthritic changes; spinal canal stenosis-stable hydrocodone 1 day prn - STABLE.   # Poorly controlled diabetes blood sugars-better controlled as per patients-overall stable FBG-288-continue close follow-up with PCP- STABLE.   # Active smoker: 2-3 packs cigarettes a day [rolls]-recommend quitting smoking.  Discussed at length regarding his multiple medical problems complicated by smoking.  Does not seem to be interested in quitting at this time.  #Prostatism symptoms: Continue Prazosin.   # DISPOSITION:  # NO labs needed today; NO IVFs   # follow up in in 2 months -MD; labs- cbc/cmp; ca-19-9; CEA;  possible IVFs over 1 hours-Dr.B  Cc; [place]-     All questions were answered. The patient knows to call the clinic with any problems, questions or concerns.    Cammie Sickle, MD 01/05/2022 1:03 PM

## 2022-01-05 NOTE — Telephone Encounter (Signed)
Patient will keep appt for MD/poss IVF today but did not want to labs drawn.  Will see MD first to assess need for labs today.

## 2022-01-08 ENCOUNTER — Encounter: Payer: Self-pay | Admitting: Internal Medicine

## 2022-01-08 ENCOUNTER — Other Ambulatory Visit: Payer: Self-pay | Admitting: Anatomic Pathology & Clinical Pathology

## 2022-01-08 LAB — CYTOLOGY - NON PAP

## 2022-01-08 NOTE — Progress Notes (Signed)
Patient advised regarding the results of the biopsy-positive for cancer.  Recommend evaluation in the clinic this week.  Please schedule appointment on 7/20- at 3 o'clock- MD,  no labs.   Thanks, GB

## 2022-01-09 ENCOUNTER — Ambulatory Visit: Payer: BC Managed Care – PPO | Admitting: Internal Medicine

## 2022-01-10 MED FILL — Dexamethasone Sodium Phosphate Inj 100 MG/10ML: INTRAMUSCULAR | Qty: 1 | Status: AC

## 2022-01-11 ENCOUNTER — Inpatient Hospital Stay (HOSPITAL_BASED_OUTPATIENT_CLINIC_OR_DEPARTMENT_OTHER): Payer: BC Managed Care – PPO | Admitting: Internal Medicine

## 2022-01-11 ENCOUNTER — Encounter: Payer: Self-pay | Admitting: Internal Medicine

## 2022-01-11 DIAGNOSIS — C182 Malignant neoplasm of ascending colon: Secondary | ICD-10-CM

## 2022-01-11 NOTE — Assessment & Plan Note (Addendum)
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  CT scan -JUNE 9th, 2023-no evidence of progression of subcapsular liver lesion/biopsy-proven colon cancer; no evidence of new disease; however subsequent CT done for mesenteric ischemia noted to have a peripancreatic mass [ hypoenhancement involving the neck of the pancreas measuring 3.0 x 2.8 by 2.4 cm].  See below.   #Incidental pancreatic mass -December 18, 2021 PET scan:  Vague mild hypermetabolism (max SUV 3.0) in the pancreatic neck.  Abdomen MRI with and without contrast- July 1st, 2023-approximately 3 cm mass in the neck of the pancreas highly concerning for malignancy. JULY 2023-[EUS- Dr.Burbridge]-positive for adenocarcinoma.  Again discussed the differential diagnosis  malignant [metastatic disease from colon cancer versus primary pancreatic cancer].  Discussed at cancer conference.  #Poorly controlled blood pressures-currently on Amlodpine 5 mg; lisinopril 20-HCTZ; stable.  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 STABLE  #PAD/Abdominal pain-likely secondary to mesenteric ischemia-status post PTCA balloon angioplasty; continue aspirin and Plavix as per vascular [s/p EUS on 7/13-re-start in 3 days]-overall improved/STABLE  # Anemia- secondary- sec to chemo-hemoglobin on 10-11-STABLE.   # Back pain- MRI lumbar spine- July 2021-NEG for mets; severe arthritic changes; spinal canal stenosis-stable hydrocodone 1 day prn - STABLE.   # Poorly controlled diabetes blood sugars-better controlled as per patients-overall stable [id number]-continue close follow-up with PCP- STABLE.   # Active smoker: 2-3 packs cigarettes a day [rolls]-recommend quitting  smoking.  Discussed at length regarding his multiple medical problems complicated by smoking.  Does not seem to be interested in quitting at this time.  #Prognosis: I had a long discussion with patient and wife regarding overall poor prognosis given his multiple malignancies. Patient would be at best be served with palliative chemotherapy.  However, Unfortunately the options at this time are quite limited given his overall poor tolerance/multiple comorbidities.  I think it is reasonable to hold off chemotherapy for a month reassess and then consider further palliative therapy.  # DISPOSITION:  # move up appts from sep 15th by 1 month- Dr.B ------------------ # follow up in in 2 months -MD; labs- cbc/cmp; ca-19-9; CEA;  possible IVFs over 1 hours-Dr.B  # 40 minutes face-to-face with the patient discussing the above plan of care; more than 50% of time spent on prognosis/ natural history; counseling and coordination.   Cc; [facility]-

## 2022-01-11 NOTE — Progress Notes (Signed)
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  MRI brain negative  # aug 17th 2020-carbo etoposide-RT [? 8/19]; #4 cycle carbo-Etop [finished dec 30th,2020]  #August 2020 iron deficient anemia-question etiology; IV Feraheme [no colonoscopy]  # OCT 2020- colo/ Cecal adeno ca; MRI liver- 1 cm enhancing lesion "metastasis" [Dr.Anna/Dr.Cannon]-difficulty biopsy.  November 02-2019 right hemicolectomy- STAGE III [pT3pN1 (2/13LN)]- HOLD adjuvant therapy. MMR- INTACT/LOW  #May 2021-liver biopsy-possible adenocarcinoma; colorectal origin.  Stage IV colon cancer  #May 24-2021-FOLFOX with Avastin; AUG 31st,2021- STABLE liver lesion; SEP 1st, 2021- Discontinue LV+Ox sec to severe fatigue/PN; cont 5FU CIV + Avastin  #Incidental pancreatic mass -December 18, 2021 PET scan:  Vague mild hypermetabolism (max SUV 3.0) in the pancreatic neck.  Abdomen MRI with and without contrast- July 1st, 2023-approximately 3 cm mass in the neck of the pancreas highly concerning for malignancy. JULY 2023-[EUS- Dr.Burbridge]-positive for adenocarcinoma.  Again discussed the differential diagnosis  malignant [metastatic disease from colon cancer versus primary pancreatic cancer].    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 ([place])  01/29/2019 Initial Diagnosis   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 [facility])   02/09/2019 - 06/24/2019 Chemotherapy   The patient had palonosetron (ALOXI) injection 0.25 mg, 0.25 mg, Intravenous,  Once, 4 of 4 cycles Administration: 0.25 mg (02/09/2019), 0.25 mg (03/03/2019), 0.25 mg (06/02/2019), 0.25 mg (06/22/2019) CARBOplatin (PARAPLATIN) 410 mg in sodium chloride 0.9 % 250 mL chemo infusion, 410 mg (100 % of original dose 414 mg), Intravenous,  Once, 4 of 4 cycles Dose modification:   (original dose 414 mg, Cycle 1) Administration: 410 mg (02/09/2019), 410 mg (03/03/2019), 410 mg (06/02/2019), 410 mg (06/22/2019) etoposide (VEPESID) 180 mg in sodium chloride 0.9 % 500 mL chemo infusion, 100 mg/m2 = 180 mg, Intravenous,  Once, 4 of 4 cycles Administration: 180 mg (02/09/2019), 180 mg (02/10/2019), 180 mg (02/11/2019), 180 mg (03/03/2019), 180 mg (03/04/2019), 180 mg (03/05/2019), 180 mg (06/02/2019), 180 mg (06/03/2019), 180 mg (06/04/2019), 180 mg (06/22/2019), 180 mg (06/23/2019), 180 mg (06/24/2019) fosaprepitant (EMEND) 150 mg, dexamethasone (DECADRON) 6 mg in sodium chloride 0.9 % 145 mL IVPB, , Intravenous,  Once, 2 of 2 cycles Administration:  (06/02/2019),  (06/22/2019)  for chemotherapy treatment.    Cancer of right colon ([place])  05/04/2019 Initial Diagnosis   Cancer of ascending colon ([place])   11/30/2019 -  Chemotherapy    Patient is on Treatment Plan: COLORECTAL FOLFOX + BEVACIZUMAB Q14D       HISTORY OF PRESENTING ILLNESS: Accompanied by his wife.  Ambulating  independently.  Kevin Mckay 71 y.o.  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  In the interim patient underwent endoscopic ultrasound for a possible pancreatic mass yesterday.   In the interim patient underwent angiogram of the inferior mesenteric artery/PTCA; bilateral femoral arteries.  Notes improvement of his abdominal pain.  Appetite is still poor.  States his blood pressures are running in 170s at home.  Has been off all blood pressure medications given his previous hypotension.  Review of Systems  Constitutional:  Positive for malaise/fatigue and weight loss. Negative for chills, diaphoresis and fever.  HENT:  Negative for nosebleeds and sore throat.   Eyes:  Negative for double vision.  Respiratory:  Negative for hemoptysis and wheezing.   Cardiovascular:  Negative for chest pain, palpitations and orthopnea.  Gastrointestinal:  Negative for abdominal pain, blood in stool, constipation, diarrhea, heartburn, melena, nausea and vomiting.  Genitourinary:  Negative for dysuria, frequency and urgency.  Musculoskeletal:  Positive for back pain and joint pain.  Skin: Negative.  Negative for itching and rash.  Neurological:  Positive for tingling. Negative for dizziness, focal weakness and weakness.  Endo/Heme/Allergies:  Does not bruise/bleed easily.  Psychiatric/Behavioral:  Negative for depression. The patient is not nervous/anxious and does not have insomnia.      MEDICAL HISTORY:  Past Medical History:  Diagnosis Date   Asthma    Cancer (HCC)    Metastatic small cell lung cancer   Cerebral aneurysm    Chronic painful diabetic neuropathy (HCC)    Depression    Diabetes mellitus without complication (HCC)    Essential hypertension    History of kidney stones    Occasional tremors    Tobacco use      SURGICAL HISTORY: Past Surgical History:  Procedure Laterality Date   ABDOMINAL SURGERY     age 39. trauma surgery due to forklift injury- liver and spleen   COLONOSCOPY WITH PROPOFOL N/A 03/27/2019   Procedure: COLONOSCOPY WITH PROPOFOL;  Surgeon: Wyline Mood, MD;  Location: [facility] ENDOSCOPY;  Service: Gastroenterology;  Laterality: N/A;   COLONOSCOPY WITH PROPOFOL N/A 12/02/2021   Procedure: COLONOSCOPY WITH PROPOFOL;  Surgeon: Regis Bill, MD;  Location: [facility] ENDOSCOPY;  Service: Endoscopy;  Laterality: N/A;   COLOSTOMY REVISION Right 05/04/2019   Procedure: COLON RESECTION RIGHT-right hemicolectomy-open;  Surgeon: Duanne Guess, MD;  Location: [facility] ORS;  Service: General;  Laterality: Right;   ESOPHAGOGASTRODUODENOSCOPY N/A 12/02/2021   Procedure: ESOPHAGOGASTRODUODENOSCOPY (EGD);  Surgeon: Regis Bill, MD;  Location[facility] ENDOSCOPY;  Service: Endoscopy;  Laterality: N/A;   ESOPHAGOGASTRODUODENOSCOPY (EGD) WITH PROPOFOL N/A 03/27/2019   Procedure: ESOPHAGOGASTRODUODENOSCOPY (EGD) WITH PROPOFOL;  Surgeon: Wyline Mood, MD;  Location: [facility] ENDOSCOPY;  Service: Gastroenterology;  Laterality: N/A;   EUS N/A 01/04/2022   Procedure: UPPER ENDOSCOPIC ULTRASOUND (EUS) LINEAR;  Surgeon: Bearl Mulberry, MD;  Location: [facility] ENDOSCOPY;  Service: Gastroenterology;  Laterality: N/A;  [facility]   IR GENERIC HISTORICAL  05/31/2016   IR ANGIO VERTEBRAL SEL VERTEBRAL BILAT MOD SED 05/31/2016 Lisbeth Renshaw, MD [facility]   IR GENERIC HISTORICAL  05/31/2016   IR ANGIO INTRA EXTRACRAN SEL INTERNAL CAROTID BILAT MOD SED 05/31/2016 Lisbeth Renshaw, MD [facility]   PORTACATH PLACEMENT Right 02/04/2019   Procedure: INSERTION PORT-A-CATH;  Surgeon: Hulda Marin, MD;  Location: [facility] ORS;  Service: General;  Laterality: Right;  THORACOTOMY Left 01/22/2019   Procedure: PRE OP BRONCH LEFT ANTERIOR THORACOTOMY WITH BIOPSY OF MEDIASTINAL MASS;  Surgeon: [name], MD;  Location: [facility]  ORS;  Service: General;  Laterality: Left;   VISCERAL ANGIOGRAPHY N/A 12/18/2021   Procedure: VISCERAL ANGIOGRAPHY;  Surgeon: Algernon Huxley, MD;  Location: [place] CV LAB;  Service: Cardiovascular;  Laterality: N/A;    SOCIAL HISTORY: Social History   Socioeconomic History   Marital status: Married    Spouse name: Not on file   Number of children: Not on file   Years of education: Not on file   Highest education level: Not on file  Occupational History   Not on file  Tobacco Use   Smoking status: Every Day    Packs/day: 0.50    Years: 50.00    Total pack years: 25.00    Types: Cigarettes   Smokeless tobacco: Never  Vaping Use   Vaping Use: Never used  Substance and Sexual Activity   Alcohol use: No   Drug use: Yes    Types: Barbituates, Marijuana    Comment: yesterday   Sexual activity: Not Currently  Other Topics Concern   Not on file  Social History Narrative   Lives in [place]; wife/ son/grandson [custody]; smoker; vending business; hx of alcoholism- quit at 43.    Social Determinants of Health   Financial Resource Strain: Low Risk  (05/04/2019)   Overall Financial Resource Strain (CARDIA)    Difficulty of Paying Living Expenses: Not hard at all  Food Insecurity: No Food Insecurity (05/04/2019)   Hunger Vital Sign    Worried About Running Out of Food in the Last Year: Never true    Ran Out of Food in the Last Year: Never true  Transportation Needs: No Transportation Needs (05/04/2019)   PRAPARE - Hydrologist (Medical): No    Lack of Transportation (Non-Medical): No  Physical Activity: Inactive (05/04/2019)   Exercise Vital Sign    Days of Exercise per Week: 0 days    Minutes of Exercise per Session: 0 min  Stress: No Stress Concern Present (05/04/2019)   [place]    Feeling of Stress : Not at all  Social Connections: Unknown (05/04/2019)   Social Connection and  Isolation Panel [NHANES]    Frequency of Communication with Friends and Family: Patient refused    Frequency of Social Gatherings with Friends and Family: Patient refused    Attends Religious Services: Patient refused    Active Member of Clubs or Organizations: Patient refused    Attends Archivist Meetings: Patient refused    Marital Status: Patient refused  Intimate Partner Violence: Not At Risk (05/04/2019)   Humiliation, Afraid, Rape, and Kick questionnaire    Fear of Current or Ex-Partner: No    Emotionally Abused: No    Physically Abused: No    Sexually Abused: No    FAMILY HISTORY: Family History  Problem Relation Age of Onset   Lymphoma Father    Liver cancer Paternal Uncle    Lung cancer Paternal Uncle    Lung cancer Maternal Uncle     ALLERGIES:  is allergic to ferumoxytol, amoxicillin, and codeine.  MEDICATIONS:  Current Outpatient Medications  Medication Sig Dispense Refill   amLODipine (NORVASC) 5 MG tablet Take 1 tablet (5 mg total) by mouth daily. 30 tablet 1   aspirin EC 81 MG tablet Take 1 tablet (81 mg total) by mouth  daily. Swallow whole. 150 tablet 2   atorvastatin (LIPITOR) 10 MG tablet Take 1 tablet (10 mg total) by mouth daily. 30 tablet 11   budesonide-formoterol (SYMBICORT) 160-4.5 MCG/ACT inhaler Inhale 2 puffs into the lungs 2 (two) times daily.     clopidogrel (PLAVIX) 75 MG tablet Take 1 tablet (75 mg total) by mouth daily. 30 tablet 6   DULoxetine (CYMBALTA) 30 MG capsule Take 90 mg by mouth every morning.     gabapentin (NEURONTIN) 800 MG tablet Take 800 mg by mouth 3 (three) times daily.     HYDROcodone-acetaminophen (NORCO/VICODIN) 5-325 MG tablet Take 1 tablet by mouth every 12 (twelve) hours as needed for moderate pain. 60 tablet 0   insulin glargine (LANTUS) 100 UNIT/ML injection Inject 10 Units into the skin 2 (two) times daily. Morning and Evening if Blood Sugar is over 150     lidocaine-prilocaine (EMLA) cream Apply 1 application.  topically as needed. 30-45 mins prior to port access. 30 g 0   lisinopril-hydrochlorothiazide (ZESTORETIC) 20-12.5 MG tablet Take 1 tablet by mouth daily. 30 tablet 1   metFORMIN (GLUCOPHAGE) 1000 MG tablet Take 500 mg by mouth. 2 in the a.m, 1 at noon, 1 at p.m     prazosin (MINIPRESS) 1 MG capsule Take 1 mg by mouth at bedtime.     pyridOXINE (VITAMIN B-6) 100 MG tablet Take 100 mg by mouth daily.     sitaGLIPtin (JANUVIA) 25 MG tablet Take 25 mg by mouth daily.     sodium chloride 1 g tablet Take 1 g by mouth 3 (three) times daily with meals.     Ipratropium-Albuterol (COMBIVENT) 20-100 MCG/ACT AERS respimat Inhale 1 puff into the lungs every 6 (six) hours as needed.     No current facility-administered medications for this visit.   Facility-Administered Medications Ordered in Other Visits  Medication Dose Route Frequency Provider Last Rate Last Admin   heparin lock flush 100 UNIT/ML injection            heparin lock flush 100 UNIT/ML injection            sodium chloride flush (NS) 0.9 % injection 10 mL  10 mL Intravenous PRN Charlaine Dalton R, MD   10 mL at 09/13/20 0943   sodium chloride flush (NS) 0.9 % injection 10 mL  10 mL Intravenous PRN Cammie Sickle, MD   10 mL at 07/13/21 0948      .  PHYSICAL EXAMINATION: ECOG PERFORMANCE STATUS: 0 - Asymptomatic  Vitals:   01/11/22 1458  BP: 139/65  Pulse: 64  Temp: (!) 97.5 F (36.4 C)  SpO2: 100%   Filed Weights   01/11/22 1458  Weight: 133 lb 3.2 oz (60.4 kg)    Physical Exam HENT:     Head: Normocephalic and atraumatic.     Mouth/Throat:     Pharynx: No oropharyngeal exudate.  Eyes:     Pupils: Pupils are equal, round, and reactive to light.  Cardiovascular:     Rate and Rhythm: Normal rate and regular rhythm.  Pulmonary:     Effort: No respiratory distress.     Breath sounds: No wheezing.  Abdominal:     General: Bowel sounds are normal. There is no distension.     Palpations: Abdomen is soft. There  is no mass.     Tenderness: There is no abdominal tenderness. There is no guarding or rebound.     Comments: Abdominal incision well-healed.  Musculoskeletal:  General: No tenderness. Normal range of motion.     Cervical back: Normal range of motion and neck supple.  Skin:    General: Skin is warm.  Neurological:     Mental Status: He is alert and oriented to person, place, and time.  Psychiatric:        Mood and Affect: Affect normal.     LABORATORY DATA:  I have reviewed the data as listed Lab Results  Component Value Date   WBC 7.9 12/21/2021   HGB 10.3 (L) 12/21/2021   HCT 31.0 (L) 12/21/2021   MCV 84.5 12/21/2021   PLT 169 12/21/2021   Recent Labs    12/01/21 0641 12/02/21 0614 12/08/21 0853 12/21/21 1046  NA 138 137 133* 131*  K 4.2 3.8 4.2 4.8  CL 108 107 98 99  CO2 $Re'24 24 26 25  'LkD$ GLUCOSE 141* 152* 210* 289*  BUN $Re'18 10 11 'CTR$ 24*  CREATININE 0.98 0.62 0.88 1.49*  CALCIUM 9.1 8.8* 9.0 8.9  GFRNONAA >60 >60 >60 50*  PROT 6.8  --  6.9 6.8  ALBUMIN 3.7  --  3.5 3.7  AST 21  --  14* 20  ALT 14  --  14 17  ALKPHOS 49  --  53 59  BILITOT 0.6  --  0.5 0.5    RADIOGRAPHIC STUDIES: I have personally reviewed the radiological images as listed and agreed with the findings in the report. MR Abdomen W Wo Contrast  Result Date: 12/24/2021 CLINICAL DATA:  Follow-up pancreatic lesion. EXAM: MRI ABDOMEN WITHOUT AND WITH CONTRAST TECHNIQUE: Multiplanar multisequence MR imaging of the abdomen was performed both before and after the administration of intravenous contrast. CONTRAST:  7.5mL GADAVIST GADOBUTROL 1 MMOL/ML IV SOLN COMPARISON:  12/02/2021 and 03/10/2021 FINDINGS: Lower chest: No acute findings. Hepatobiliary: No hepatic masses identified. Gallbladder is unremarkable. No evidence of biliary ductal dilatation. Pancreas: Diffuse atrophy pancreatic body and tail is again noted. A heterogeneously enhancing mass is seen in the pancreatic neck which measures 3.4 x 2.6 cm on  image 33/19, similar in size to previous study but new since 03/10/2021. This is highly suspicious for pancreatic carcinoma. This mass abuts the portal venous confluence, and encases and narrows the proximal main portal vein. Spleen:  Within normal limits in size and appearance. Adrenals/Urinary Tract: No masses identified. No evidence of hydronephrosis. Stomach/Bowel: Unremarkable. Vascular/Lymphatic: No pathologically enlarged lymph nodes identified. No acute vascular findings. Chronic portosystemic venous collaterals are seen gastrosplenic and gastrocolic ligaments. Other:  None. Musculoskeletal:  No suspicious bone lesions identified. IMPRESSION: 3.4 cm mass in the pancreatic neck, highly suspicious for pancreatic carcinoma. This mass abuts the portal venous confluence, and encases and narrows the proximal main portal vein. EUS/FNA should be considered further evaluation. No evidence of abdominal metastatic disease. Electronically Signed   By: Marlaine Hind M.D.   On: 12/24/2021 16:33   PERIPHERAL VASCULAR CATHETERIZATION  Result Date: 12/18/2021 See surgical note for result.  NM PET Image Restage (PS) Skull Base to Thigh (F-18 FDG)  Result Date: 12/14/2021 CLINICAL DATA:  Subsequent treatment strategy for small cell left lung cancer and additional history of right colon cancer. Concern for pancreatic neck mass on recent CT. EXAM: NUCLEAR MEDICINE PET SKULL BASE TO THIGH TECHNIQUE: 6.6 mCi F-18 FDG was injected intravenously. Full-ring PET imaging was performed from the skull base to thigh after the radiotracer. CT data was obtained and used for attenuation correction and anatomic localization. Fasting blood glucose: 197 mg/dl COMPARISON:  12/02/2021 CT angiogram  of the abdomen and pelvis. 10/04/2021 CT chest, abdomen and pelvis. 12/25/2018 PET-CT. 10/21/2019 MRI abdomen. FINDINGS: Mediastinal blood pool activity: SUV max 2.1 Liver activity: SUV max NA NECK: No hypermetabolic lymph nodes in the neck.  Incidental CT findings: Inferior right maxillary sinus 1.6 cm mucous retention cyst versus polyp. Partial opacification of the right mastoid air cells. CHEST: No enlarged or hypermetabolic axillary, mediastinal or hilar lymph nodes. Sharply marginated mid to upper left perihilar consolidation with associated mild volume loss and distortion and with associated low level generalized FDG uptake with max SUV 2.3, compatible with radiation fibrosis. No focal pulmonary hypermetabolism to suggest tumor recurrence or metastatic disease. Incidental CT findings: Right subclavian Port-A-Cath terminates in the lower third of the SVC. Atherosclerotic nonaneurysmal thoracic aorta. Right upper lobe 0.4 cm solid pulmonary nodule (series 2/image 73), below PET resolution, not definitely seen on prior CT studies. Coronary atherosclerosis. ABDOMEN/PELVIS: Indistinct 0.7 cm subcapsular hypodense peripheral segment 8 right liver lesion (series 2/image 121), unchanged on the CT images from 11/30/2021 CT and without hypermetabolism. Vague mild hypermetabolism (max SUV 3.0) in the pancreatic neck at the recently questioned focus of hypoenhancement on 12/02/2021 CT angiogram abdomen study. Chronic atrophy of the pancreatic body and tail. No abnormal hypermetabolic activity within the liver, adrenal glands, or spleen. No hypermetabolic lymph nodes in the abdomen or pelvis. Incidental CT findings: Cholecystectomy. Atherosclerotic nonaneurysmal abdominal aorta. Stable postsurgical changes from subtotal right hemicolectomy. Marked sigmoid diverticulosis. Moderate colorectal stool. SKELETON: No focal hypermetabolic activity to suggest skeletal metastasis. Incidental CT findings: none IMPRESSION: 1. Vague mild hypermetabolism (max SUV 3.0) in the pancreatic neck at the recently questioned focus of hypoenhancement on 12/02/2021 CT angiogram abdomen/pelvis study. Imaging findings remain equivocal, with primary pancreatic malignancy not excluded in  this location. MRI abdomen without and with IV contrast is the best imaging test for evaluation of the pancreas and is suggested at this time. 2. Stable left upper perihilar radiation fibrosis without evidence of local tumor recurrence. 3. Otherwise no hypermetabolic findings to suggest metastatic disease. Indistinct subcentimeter hypodense peripheral right liver subcapsular lesion is unchanged on the CT images from 11/30/2021 CT and is non hypermetabolic, compatible with treated metastasis. 4. Tiny 0.4 cm right upper lobe pulmonary nodule is below PET resolution and not definitely seen on prior CT studies. Suggest attention on follow-up chest CT in 3 months. 5. Chronic findings include: Aortic Atherosclerosis (ICD10-I70.0). Coronary atherosclerosis. Marked sigmoid diverticulosis. Electronically Signed   By: Ilona Sorrel M.D.   On: 12/14/2021 13:24    ASSESSMENT & PLAN:   Cancer of right colon ([place]) # Right colon cancer stage IV- liver metastases; adenocarcinoma; most recently on 5FU + avastin-chemotherapy currently on hold -because of mild to moderate intolerance/stability disease/patient preference.  CT scan -JUNE 9th, 2023-no evidence of progression of subcapsular liver lesion/biopsy-proven colon cancer; no evidence of new disease; however subsequent CT done for mesenteric ischemia noted to have a peripancreatic mass [ hypoenhancement involving the neck of the pancreas measuring 3.0 x 2.8 by 2.4 cm].  See below.   #Incidental pancreatic mass -December 18, 2021 PET scan:  Vague mild hypermetabolism (max SUV 3.0) in the pancreatic neck.  Abdomen MRI with and without contrast- July 1st, 2023-approximately 3 cm mass in the neck of the pancreas highly concerning for malignancy. JULY 2023-[EUS- Dr.Burbridge]-positive for adenocarcinoma.  Again discussed the differential diagnosis  malignant [metastatic disease from colon cancer versus primary pancreatic cancer].  Discussed at cancer conference.  #Poorly controlled  blood pressures-currently on Amlodpine 5  mg; lisinopril 20-HCTZ; stable.  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 STABLE  #PAD/Abdominal pain-likely secondary to mesenteric ischemia-status post PTCA balloon angioplasty; continue aspirin and Plavix as per vascular [s/p EUS on 7/13-re-start in 3 days]-overall improved/STABLE  # Anemia- secondary- sec to chemo-hemoglobin on 10-11-STABLE.   # Back pain- MRI lumbar spine- July 2021-NEG for mets; severe arthritic changes; spinal canal stenosis-stable hydrocodone 1 day prn - STABLE.   # Poorly controlled diabetes blood sugars-better controlled as per patients-overall stable FBG-288-continue close follow-up with PCP- STABLE.   # Active smoker: 2-3 packs cigarettes a day [rolls]-recommend quitting smoking.  Discussed at length regarding his multiple medical problems complicated by smoking.  Does not seem to be interested in quitting at this time.  #Prognosis: I had a long discussion with patient and wife regarding overall poor prognosis given his multiple malignancies. Patient would be at best be served with palliative chemotherapy.  However, Unfortunately the options at this time are quite limited given his overall poor tolerance/multiple comorbidities.  I think it is reasonable to hold off chemotherapy for a month reassess and then consider further palliative therapy.  # DISPOSITION:  # move up appts from sep 15th by 1 month- Dr.B ------------------ # follow up in in 2 months -MD; labs- cbc/cmp; ca-19-9; CEA;  possible IVFs over 1 hours-Dr.B  # 40 minutes face-to-face with the patient discussing the above plan of care; more than 50% of time spent on prognosis/ natural history; counseling and coordination.   Cc; [facility]-     All questions were answered. The patient knows to call the clinic  with any problems, questions or concerns.    Cammie Sickle, MD 01/11/2022 11:22 PM

## 2022-01-11 NOTE — Progress Notes (Signed)
Having some pain left chest/breast area with SOB at times.   Should he take the aspirin with the blood pressure medicine?

## 2022-01-15 ENCOUNTER — Other Ambulatory Visit: Payer: Self-pay

## 2022-01-22 ENCOUNTER — Other Ambulatory Visit (INDEPENDENT_AMBULATORY_CARE_PROVIDER_SITE_OTHER): Payer: Self-pay | Admitting: Vascular Surgery

## 2022-01-22 DIAGNOSIS — Z9862 Peripheral vascular angioplasty status: Secondary | ICD-10-CM

## 2022-01-22 DIAGNOSIS — I70221 Atherosclerosis of native arteries of extremities with rest pain, right leg: Secondary | ICD-10-CM

## 2022-01-22 DIAGNOSIS — K551 Chronic vascular disorders of intestine: Secondary | ICD-10-CM

## 2022-01-22 DIAGNOSIS — I745 Embolism and thrombosis of iliac artery: Secondary | ICD-10-CM

## 2022-01-23 ENCOUNTER — Other Ambulatory Visit: Payer: Self-pay

## 2022-01-23 ENCOUNTER — Other Ambulatory Visit: Payer: Self-pay | Admitting: Physician Assistant

## 2022-01-23 ENCOUNTER — Ambulatory Visit (INDEPENDENT_AMBULATORY_CARE_PROVIDER_SITE_OTHER): Payer: BC Managed Care – PPO

## 2022-01-23 ENCOUNTER — Encounter (INDEPENDENT_AMBULATORY_CARE_PROVIDER_SITE_OTHER): Payer: Self-pay

## 2022-01-23 ENCOUNTER — Ambulatory Visit
Admission: RE | Admit: 2022-01-23 | Discharge: 2022-01-23 | Disposition: A | Discharge status: Home / Self Care | Payer: BC Managed Care – PPO | Attending: Physician Assistant | Admitting: Physician Assistant

## 2022-01-23 ENCOUNTER — Ambulatory Visit
Admission: RE | Admit: 2022-01-23 | Discharge: 2022-01-23 | Disposition: A | Discharge status: Home / Self Care | Payer: BC Managed Care – PPO | Source: Ambulatory Visit | Attending: Physician Assistant | Admitting: Physician Assistant

## 2022-01-23 ENCOUNTER — Ambulatory Visit (INDEPENDENT_AMBULATORY_CARE_PROVIDER_SITE_OTHER): Payer: BC Managed Care – PPO | Admitting: Vascular Surgery

## 2022-01-23 DIAGNOSIS — I745 Embolism and thrombosis of iliac artery: Secondary | ICD-10-CM | POA: Diagnosis not present

## 2022-01-23 DIAGNOSIS — K551 Chronic vascular disorders of intestine: Secondary | ICD-10-CM | POA: Diagnosis not present

## 2022-01-23 DIAGNOSIS — Z9862 Peripheral vascular angioplasty status: Secondary | ICD-10-CM

## 2022-01-23 DIAGNOSIS — I10 Essential (primary) hypertension: Secondary | ICD-10-CM

## 2022-01-23 DIAGNOSIS — E1140 Type 2 diabetes mellitus with diabetic neuropathy, unspecified: Secondary | ICD-10-CM

## 2022-01-23 DIAGNOSIS — I70221 Atherosclerosis of native arteries of extremities with rest pain, right leg: Secondary | ICD-10-CM

## 2022-01-23 DIAGNOSIS — Z794 Long term (current) use of insulin: Secondary | ICD-10-CM

## 2022-01-23 DIAGNOSIS — R059 Cough, unspecified: Secondary | ICD-10-CM | POA: Diagnosis not present

## 2022-01-23 DIAGNOSIS — I70211 Atherosclerosis of native arteries of extremities with intermittent claudication, right leg: Secondary | ICD-10-CM

## 2022-01-23 NOTE — Progress Notes (Signed)
  Patient had to leave for another appointment

## 2022-02-08 ENCOUNTER — Inpatient Hospital Stay: Payer: BC Managed Care – PPO | Attending: Internal Medicine

## 2022-02-08 ENCOUNTER — Inpatient Hospital Stay: Payer: BC Managed Care – PPO

## 2022-02-08 ENCOUNTER — Encounter: Payer: Self-pay | Admitting: Internal Medicine

## 2022-02-08 ENCOUNTER — Inpatient Hospital Stay: Payer: BC Managed Care – PPO | Admitting: Internal Medicine

## 2022-02-08 DIAGNOSIS — Z79899 Other long term (current) drug therapy: Secondary | ICD-10-CM | POA: Diagnosis not present

## 2022-02-08 DIAGNOSIS — R5383 Other fatigue: Secondary | ICD-10-CM | POA: Insufficient documentation

## 2022-02-08 DIAGNOSIS — G893 Neoplasm related pain (acute) (chronic): Secondary | ICD-10-CM | POA: Insufficient documentation

## 2022-02-08 DIAGNOSIS — Z79891 Long term (current) use of opiate analgesic: Secondary | ICD-10-CM | POA: Diagnosis not present

## 2022-02-08 DIAGNOSIS — E1165 Type 2 diabetes mellitus with hyperglycemia: Secondary | ICD-10-CM | POA: Diagnosis not present

## 2022-02-08 DIAGNOSIS — F1290 Cannabis use, unspecified, uncomplicated: Secondary | ICD-10-CM | POA: Diagnosis not present

## 2022-02-08 DIAGNOSIS — E860 Dehydration: Secondary | ICD-10-CM | POA: Insufficient documentation

## 2022-02-08 DIAGNOSIS — C787 Secondary malignant neoplasm of liver and intrahepatic bile duct: Secondary | ICD-10-CM | POA: Insufficient documentation

## 2022-02-08 DIAGNOSIS — D6481 Anemia due to antineoplastic chemotherapy: Secondary | ICD-10-CM | POA: Insufficient documentation

## 2022-02-08 DIAGNOSIS — Z85118 Personal history of other malignant neoplasm of bronchus and lung: Secondary | ICD-10-CM | POA: Insufficient documentation

## 2022-02-08 DIAGNOSIS — C182 Malignant neoplasm of ascending colon: Secondary | ICD-10-CM | POA: Diagnosis present

## 2022-02-08 DIAGNOSIS — C259 Malignant neoplasm of pancreas, unspecified: Secondary | ICD-10-CM | POA: Insufficient documentation

## 2022-02-08 DIAGNOSIS — K921 Melena: Secondary | ICD-10-CM | POA: Diagnosis not present

## 2022-02-08 DIAGNOSIS — R630 Anorexia: Secondary | ICD-10-CM | POA: Diagnosis not present

## 2022-02-08 DIAGNOSIS — F17210 Nicotine dependence, cigarettes, uncomplicated: Secondary | ICD-10-CM | POA: Insufficient documentation

## 2022-02-08 DIAGNOSIS — I10 Essential (primary) hypertension: Secondary | ICD-10-CM | POA: Insufficient documentation

## 2022-02-08 LAB — COMPREHENSIVE METABOLIC PANEL
ALT: 18 U/L (ref 0–44)
AST: 22 U/L (ref 15–41)
Albumin: 3.8 g/dL (ref 3.5–5.0)
Alkaline Phosphatase: 68 U/L (ref 38–126)
Anion gap: 8 (ref 5–15)
BUN: 21 mg/dL (ref 8–23)
CO2: 28 mmol/L (ref 22–32)
Calcium: 9.1 mg/dL (ref 8.9–10.3)
Chloride: 101 mmol/L (ref 98–111)
Creatinine, Ser: 1.28 mg/dL — ABNORMAL HIGH (ref 0.61–1.24)
GFR, Estimated: >60 mL/min (ref >60)
Glucose, Bld: 256 mg/dL — ABNORMAL HIGH (ref 70–99)
Potassium: 4.6 mmol/L (ref 3.5–5.1)
Sodium: 137 mmol/L (ref 135–145)
Total Bilirubin: 0.4 mg/dL (ref 0.3–1.2)
Total Protein: 6.7 g/dL (ref 6.5–8.1)

## 2022-02-08 LAB — CBC WITH DIFFERENTIAL/PLATELET
Abs Immature Granulocytes: 0.01 10*3/uL (ref 0.00–0.07)
Basophils Absolute: 0 10*3/uL (ref 0.0–0.1)
Basophils Relative: 1 %
Eosinophils Absolute: 0.4 10*3/uL (ref 0.0–0.5)
Eosinophils Relative: 6 %
HCT: 32.8 % — ABNORMAL LOW (ref 39.0–52.0)
Hemoglobin: 10.9 g/dL — ABNORMAL LOW (ref 13.0–17.0)
Immature Granulocytes: 0 %
Lymphocytes Relative: 25 %
Lymphs Abs: 1.5 10*3/uL (ref 0.7–4.0)
MCH: 28.8 pg (ref 26.0–34.0)
MCHC: 33.2 g/dL (ref 30.0–36.0)
MCV: 86.5 fL (ref 80.0–100.0)
Monocytes Absolute: 0.5 10*3/uL (ref 0.1–1.0)
Monocytes Relative: 9 %
Neutro Abs: 3.4 10*3/uL (ref 1.7–7.7)
Neutrophils Relative %: 59 %
Platelets: 144 10*3/uL — ABNORMAL LOW (ref 150–400)
RBC: 3.79 MIL/uL — ABNORMAL LOW (ref 4.22–5.81)
RDW: 14.6 % (ref 11.5–15.5)
WBC: 5.8 10*3/uL (ref 4.0–10.5)
nRBC: 0 % (ref 0.0–0.2)

## 2022-02-08 MED ORDER — HYDROCODONE-ACETAMINOPHEN 5-325 MG PO TABS: 1.0000 | ORAL_TABLET | Freq: Two times a day (BID) | ORAL | 0 refills | Status: DC | PRN

## 2022-02-08 MED ORDER — HEPARIN SOD (PORK) LOCK FLUSH 100 UNIT/ML IV SOLN
500.0000 [IU] | Freq: Once | INTRAVENOUS | Status: AC
  Administered 2022-02-08: 500 [IU] via INTRAVENOUS
  Filled 2022-02-08: qty 5

## 2022-02-08 NOTE — Assessment & Plan Note (Addendum)
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  CT scan -JUNE 9th, 2023-no evidence of progression of subcapsular liver lesion/biopsy-proven colon cancer; no evidence of new disease; however subsequent CT done for mesenteric ischemia noted to have a peripancreatic mass [ hypoenhancement involving the neck of the pancreas measuring 3.0 x 2.8 by 2.4 cm].  See below.   #Adenocarcinoma of the pancreas- [primary pancreatic CA versus metastatic colon cancer]- Abdomen MRI -July 1st, 2023-~3 cm mass.  Possibly symptomatic given his abdominal pain [also history of mesenteric ischemia].  Discussed the option of systemic chemotherapy-however patient a poor candidate for systemic chemotherapy at this time given his syncopal episodes/neuro apathy etc.  #Poorly controlled blood pressures-currently on Amlodpine 5 mg; lisinopril 20-HCTZ; stable.  #Syncopal episodes-unclear etiology.  As per the family patient's blood sugars around 490 at the time of the episodes.  Denies any seizure-like activity.  Defer to PCP for blood sugar management.  Recent MRI of the brain negative for any acute process.  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 STABLE  #PAD/Abdominal pain-likely secondary to mesenteric ischemia-status post PTCA balloon angioplasty; continue aspirin and Plavix as per vascular [s/p EUS on 7/13-re-start in 3 days]-overall improved/STABLE.  Refill pain medication.  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   # Back pain- MRI lumbar spine- July 2021-NEG for mets; severe arthritic changes; spinal canal stenosis-stable hydrocodone 1-2 day prn - STABLE.   # Poorly controlled diabetes blood  sugars-better controlled as per patients-overall stable [id number]-continue close follow-up with PCP-  STABLE  # Active smoker: 2-3 packs cigarettes a day [rolls]-recommend quitting smoking.  Continues to smoke.    # Given the multiple malignancies- incurable nature of the disease /poor tolerance of therapy and in general less than 6 months of life expectancy-I introduced hospice philosophy to the patient and family.  Discussed that goal of care should be directed to symptom management rather than treating the underlying disease; and in the process help improve quality of life rather than quantity.  Discussed with hospice team would include-nurse, nurse aide, social worker and chaplain for help take care of patient with physical/emotional needs. Declines  referral to hospice at this time. Might consider hospice at next visit.   # DISPOSITION:   # de-access  # follow up in 1 months; labs/port - cbc/cmp; Ca-19-9; [facility]-Dr.B  Cc; [place]-

## 2022-02-08 NOTE — Progress Notes (Signed)
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  MRI brain negative  # aug 17th 2020-carbo etoposide-RT [? 8/19]; #4 cycle carbo-Etop [finished dec 30th,2020]  #August 2020 iron deficient anemia-question etiology; IV Feraheme [no colonoscopy]  # OCT 2020- colo/ Cecal adeno ca; MRI liver- 1 cm enhancing lesion "metastasis" [Dr.Anna/Dr.Cannon]-difficulty biopsy.  November 02-2019 right hemicolectomy- STAGE III [pT3pN1 (2/13LN)]- HOLD adjuvant therapy. MMR- INTACT/LOW  #May 2021-liver biopsy-possible adenocarcinoma; colorectal origin.  Stage IV colon cancer  #May 24-2021-FOLFOX with Avastin; AUG 31st,2021- STABLE liver lesion; SEP 1st, 2021- Discontinue LV+Ox sec to severe fatigue/PN; cont 5FU CIV + Avastin  #Incidental pancreatic mass -December 18, 2021 PET scan:  Vague mild hypermetabolism (max SUV 3.0) in the pancreatic neck.  Abdomen MRI with and without contrast- July 1st, 2023-approximately 3 cm mass in the neck of the pancreas highly concerning for malignancy. JULY 2023-[EUS- Dr.Burbridge]-positive for adenocarcinoma.  Again discussed the differential diagnosis  malignant [metastatic disease from colon cancer versus primary pancreatic cancer].    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 ([place])  01/29/2019 Initial Diagnosis   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 [facility])   02/09/2019 - 06/24/2019 Chemotherapy   The patient had palonosetron (ALOXI) injection 0.25 mg, 0.25 mg, Intravenous,  Once, 4 of 4 cycles Administration: 0.25 mg (02/09/2019), 0.25 mg (03/03/2019), 0.25 mg (06/02/2019), 0.25 mg (06/22/2019) CARBOplatin (PARAPLATIN) 410 mg in sodium chloride 0.9 % 250 mL chemo infusion, 410 mg (100 % of original dose 414 mg), Intravenous,  Once, 4 of 4 cycles Dose modification:   (original dose 414 mg, Cycle 1) Administration: 410 mg (02/09/2019), 410 mg (03/03/2019), 410 mg (06/02/2019), 410 mg (06/22/2019) etoposide (VEPESID) 180 mg in sodium chloride 0.9 % 500 mL chemo infusion, 100 mg/m2 = 180 mg, Intravenous,  Once, 4 of 4 cycles Administration: 180 mg (02/09/2019), 180 mg (02/10/2019), 180 mg (02/11/2019), 180 mg (03/03/2019), 180 mg (03/04/2019), 180 mg (03/05/2019), 180 mg (06/02/2019), 180 mg (06/03/2019), 180 mg (06/04/2019), 180 mg (06/22/2019), 180 mg (06/23/2019), 180 mg (06/24/2019) fosaprepitant (EMEND) 150 mg, dexamethasone (DECADRON) 6 mg in sodium chloride 0.9 % 145 mL IVPB, , Intravenous,  Once, 2 of 2 cycles Administration:  (06/02/2019),  (06/22/2019)  for chemotherapy treatment.    Cancer of right colon ([place])  05/04/2019 Initial Diagnosis   Cancer of ascending colon ([place])   11/30/2019 -  Chemotherapy    Patient is on Treatment Plan: COLORECTAL FOLFOX + BEVACIZUMAB Q14D       HISTORY OF PRESENTING ILLNESS: Accompanied by his wife.  Ambulating  independently.  Kevin Mckay 71 y.o.  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  In the interim patient states that he had multiple episodes of "passing out spells".  One episode happened while he was outside in the sun. Sleeping a lot.   Continues to have abdominal pain needing hydrocodone. As per wife patient is also more fatigue.   Review of Systems  Constitutional:  Positive for malaise/fatigue and weight loss. Negative for chills, diaphoresis and fever.  HENT:  Negative for nosebleeds and sore throat.   Eyes:  Negative for double vision.  Respiratory:  Negative for hemoptysis and wheezing.   Cardiovascular:  Negative for chest pain, palpitations and orthopnea.  Gastrointestinal:  Negative for abdominal pain, blood in stool, constipation, diarrhea, heartburn, melena, nausea and vomiting.  Genitourinary:  Negative for dysuria, frequency and urgency.  Musculoskeletal:  Positive for back pain and joint pain.  Skin: Negative.  Negative for itching and rash.  Neurological:  Positive for tingling. Negative for dizziness, focal weakness and weakness.  Endo/Heme/Allergies:  Does not bruise/bleed easily.  Psychiatric/Behavioral:  Negative for depression. The patient is not nervous/anxious and does not have insomnia.      MEDICAL HISTORY:  Past Medical History:  Diagnosis Date   Asthma    Cancer ([place])    Metastatic small cell lung cancer   Cerebral aneurysm    Chronic painful diabetic neuropathy (HCC)    Depression    Diabetes mellitus without complication ([place])    Essential hypertension    History of kidney stones    Occasional tremors    Tobacco use     SURGICAL HISTORY: Past Surgical History:  Procedure Laterality Date   ABDOMINAL SURGERY     age 60.  trauma surgery due to forklift injury- liver and spleen   COLONOSCOPY WITH PROPOFOL N/A 03/27/2019   Procedure: COLONOSCOPY WITH PROPOFOL;  Surgeon: Jonathon Bellows, MD;  Location: [facility] ENDOSCOPY;  Service: Gastroenterology;  Laterality: N/A;   COLONOSCOPY WITH PROPOFOL N/A 12/02/2021   Procedure: COLONOSCOPY WITH PROPOFOL;  Surgeon: Lesly Rubenstein, MD;  Location: [facility] ENDOSCOPY;  Service: Endoscopy;  Laterality: N/A;   COLOSTOMY REVISION Right 05/04/2019   Procedure: COLON RESECTION RIGHT-right hemicolectomy-open;  Surgeon: Fredirick Maudlin, MD;  Location: [facility] ORS;  Service: General;  Laterality: Right;   ESOPHAGOGASTRODUODENOSCOPY N/A 12/02/2021   Procedure: ESOPHAGOGASTRODUODENOSCOPY (EGD);  Surgeon: Lesly Rubenstein, MD;  Location: [facility] ENDOSCOPY;  Service: Endoscopy;  Laterality: N/A;   ESOPHAGOGASTRODUODENOSCOPY (EGD) WITH PROPOFOL N/A 03/27/2019   Procedure: ESOPHAGOGASTRODUODENOSCOPY (EGD) WITH PROPOFOL;  Surgeon: Jonathon Bellows, MD;  Location: [facility] ENDOSCOPY;  Service: Gastroenterology;  Laterality: N/A;   EUS N/A 01/04/2022   Procedure: UPPER ENDOSCOPIC ULTRASOUND (EUS) LINEAR;  Surgeon: Holly Bodily, MD;  Location: [facility] ENDOSCOPY;  Service: Gastroenterology;  Laterality: N/A;  [facility]   IR GENERIC HISTORICAL  05/31/2016   IR ANGIO VERTEBRAL SEL VERTEBRAL BILAT MOD SED 05/31/2016 Consuella Lose, MD [facility]   IR GENERIC HISTORICAL  05/31/2016   IR ANGIO INTRA EXTRACRAN SEL INTERNAL CAROTID BILAT MOD SED 05/31/2016 Consuella Lose, MD [facility]   PORTACATH PLACEMENT Right 02/04/2019   Procedure: INSERTION PORT-A-CATH;  Surgeon: [name], MD;  Location: [facility] ORS;  Service: General;  Laterality: Right;   THORACOTOMY Left 01/22/2019   Procedure: PRE OP BRONCH LEFT ANTERIOR THORACOTOMY WITH BIOPSY OF MEDIASTINAL  MASS;  Surgeon: [name], MD;  Location: [facility] ORS;  Service: General;  Laterality: Left;   VISCERAL ANGIOGRAPHY N/A 12/18/2021   Procedure: VISCERAL  ANGIOGRAPHY;  Surgeon: Algernon Huxley, MD;  Location: [facility];  Service: Cardiovascular;  Laterality: N/A;    SOCIAL HISTORY: Social History   Socioeconomic History   Marital status: Married    Spouse name: Not on file   Number of children: Not on file   Years of education: Not on file   Highest education level: Not on file  Occupational History   Not on file  Tobacco Use   Smoking status: Every Day    Packs/day: 0.50    Years: 50.00    Total pack years: 25.00    Types: Cigarettes   Smokeless tobacco: Never  Vaping Use   Vaping Use: Never used  Substance and Sexual Activity   Alcohol use: No   Drug use: Yes    Types: Barbituates, Marijuana    Comment: yesterday   Sexual activity: Not Currently  Other Topics Concern   Not on file  Social History Narrative   Lives in [place]; wife/ son/grandson [custody]; smoker; vending business; hx of alcoholism- quit at 5.    Social Determinants of Health   Financial Resource Strain: Low Risk  (05/04/2019)   Overall Financial Resource Strain (CARDIA)    Difficulty of Paying Living Expenses: Not hard at all  Food Insecurity: No Food Insecurity (05/04/2019)   Hunger Vital Sign    Worried About Running Out of Food in the Last Year: Never true    Ran Out of Food in the Last Year: Never true  Transportation Needs: No Transportation Needs (05/04/2019)   PRAPARE - Hydrologist (Medical): No    Lack of Transportation (Non-Medical): No  Physical Activity: Inactive (05/04/2019)   Exercise Vital Sign    Days of Exercise per Week: 0 days    Minutes of Exercise per Session: 0 min  Stress: No Stress Concern Present (05/04/2019)   [place]    Feeling of Stress : Not at all  Social Connections: Unknown (05/04/2019)   Social Connection and Isolation Panel [NHANES]    Frequency of Communication with Friends and Family: Patient refused     Frequency of Social Gatherings with Friends and Family: Patient refused    Attends Religious Services: Patient refused    Active Member of Clubs or Organizations: Patient refused    Attends Archivist Meetings: Patient refused    Marital Status: Patient refused  Intimate Partner Violence: Not At Risk (05/04/2019)   Humiliation, Afraid, Rape, and Kick questionnaire    Fear of Current or Ex-Partner: No    Emotionally Abused: No    Physically Abused: No    Sexually Abused: No    FAMILY HISTORY: Family History  Problem Relation Age of Onset   Lymphoma Father    Liver cancer Paternal Uncle    Lung cancer Paternal Uncle    Lung cancer Maternal Uncle     ALLERGIES:  is allergic to ferumoxytol, amoxicillin, and codeine.  MEDICATIONS:  Current Outpatient Medications  Medication Sig Dispense Refill   amLODipine (NORVASC) 5 MG tablet Take 1 tablet (5 mg total) by mouth daily. 30 tablet 1   aspirin EC 81 MG tablet Take 1 tablet (81 mg total) by mouth daily. Swallow whole. 150 tablet 2   atorvastatin (LIPITOR) 10 MG tablet Take 1 tablet (  10 mg total) by mouth daily. 30 tablet 11   budesonide-formoterol (SYMBICORT) 160-4.5 MCG/ACT inhaler Inhale 2 puffs into the lungs 2 (two) times daily.     clopidogrel (PLAVIX) 75 MG tablet Take 1 tablet (75 mg total) by mouth daily. 30 tablet 6   DULoxetine (CYMBALTA) 30 MG capsule Take 90 mg by mouth every morning.     gabapentin (NEURONTIN) 800 MG tablet Take 800 mg by mouth 3 (three) times daily.     insulin glargine (LANTUS) 100 UNIT/ML injection Inject 10 Units into the skin 2 (two) times daily. Morning and Evening if Blood Sugar is over 150     lidocaine-prilocaine (EMLA) cream Apply 1 application. topically as needed. 30-45 mins prior to port access. 30 g 0   lisinopril-hydrochlorothiazide (ZESTORETIC) 20-12.5 MG tablet Take 1 tablet by mouth daily. 30 tablet 1   metFORMIN (GLUCOPHAGE-XR) 500 MG 24 hr tablet Take 1,000 mg by mouth 2 (two)  times daily.     prazosin (MINIPRESS) 1 MG capsule Take 1 mg by mouth at bedtime.     pyridOXINE (VITAMIN B-6) 100 MG tablet Take 100 mg by mouth daily.     sitaGLIPtin (JANUVIA) 25 MG tablet Take 25 mg by mouth daily.     sodium chloride 1 g tablet Take 1 g by mouth 3 (three) times daily with meals.     HYDROcodone-acetaminophen (NORCO/VICODIN) 5-325 MG tablet Take 1 tablet by mouth every 12 (twelve) hours as needed for moderate pain. 60 tablet 0   Ipratropium-Albuterol (COMBIVENT) 20-100 MCG/ACT AERS respimat Inhale 1 puff into the lungs every 6 (six) hours as needed.     metFORMIN (GLUCOPHAGE) 1000 MG tablet Take 500 mg by mouth. 2 in the a.m, 1 at noon, 1 at p.m (Patient not taking: Reported on 02/08/2022)     No current facility-administered medications for this visit.   Facility-Administered Medications Ordered in Other Visits  Medication Dose Route Frequency Provider Last Rate Last Admin   heparin lock flush 100 UNIT/ML injection            heparin lock flush 100 UNIT/ML injection            sodium chloride flush (NS) 0.9 % injection 10 mL  10 mL Intravenous PRN Charlaine Dalton R, MD   10 mL at 09/13/20 0943   sodium chloride flush (NS) 0.9 % injection 10 mL  10 mL Intravenous PRN Cammie Sickle, MD   10 mL at 07/13/21 0948      .  PHYSICAL EXAMINATION: ECOG PERFORMANCE STATUS: 0 - Asymptomatic  Vitals:   02/08/22 0929  BP: 127/63  Pulse: 70  Resp: 18  Temp: (!) 96.5 F (35.8 C)  SpO2: 100%   Filed Weights   02/08/22 0929  Weight: 136 lb 6.4 oz (61.9 kg)    Physical Exam HENT:     Head: Normocephalic and atraumatic.     Mouth/Throat:     Pharynx: No oropharyngeal exudate.  Eyes:     Pupils: Pupils are equal, round, and reactive to light.  Cardiovascular:     Rate and Rhythm: Normal rate and regular rhythm.  Pulmonary:     Effort: No respiratory distress.     Breath sounds: No wheezing.  Abdominal:     General: Bowel sounds are normal. There is no  distension.     Palpations: Abdomen is soft. There is no mass.     Tenderness: There is no abdominal tenderness. There is no guarding or rebound.  Comments: Abdominal incision well-healed.  Musculoskeletal:        General: No tenderness. Normal range of motion.     Cervical back: Normal range of motion and neck supple.  Skin:    General: Skin is warm.  Neurological:     Mental Status: He is alert and oriented to person, place, and time.  Psychiatric:        Mood and Affect: Affect normal.     LABORATORY DATA:  I have reviewed the data as listed Lab Results  Component Value Date   WBC 5.8 02/08/2022   HGB 10.9 (L) 02/08/2022   HCT 32.8 (L) 02/08/2022   MCV 86.5 02/08/2022   PLT 144 (L) 02/08/2022   Recent Labs    12/08/21 0853 12/21/21 1046 02/08/22 0910  NA 133* 131* 137  K 4.2 4.8 4.6  CL 98 99 101  CO2 _0 GLUCOSE 210* 289* 256*  BUN 11 24* 21  CREATININE 0.88 1.49* 1.28*  CALCIUM 9.0 8.9 9.1  GFRNONAA >60 50* >60  PROT 6.9 6.8 6.7  ALBUMIN 3.5 3.7 3.8  AST 14* 20 22  ALT _1 ALKPHOS 53 59 68  BILITOT 0.5 0.5 0.4    RADIOGRAPHIC STUDIES: I have personally reviewed the radiological images as listed and agreed with the findings in the report. DG Chest 2 View  Result Date: 01/23/2022 CLINICAL DATA:  Cough and congestion for several months, history of pancreatic and colon carcinoma EXAM: CHEST - 2 VIEW COMPARISON:  12/14/2021 PET-CT FINDINGS: Cardiac shadow is within normal limits. Right chest wall port is noted. The lungs are well aerated bilaterally. Architectural distortion is again seen in the region of the left hilum similar to that seen on prior PET-CT consistent with prior radiation therapy. No focal infiltrate or effusion is seen. No bony abnormality is noted. IMPRESSION: Post radiation changes in the left hilar region. No acute abnormality noted. Electronically Signed   By: Inez Catalina M.D.   On: 01/23/2022 20:11   VAS [place]  MESENTERIC  Result Date: 01/23/2022 ABDOMINAL VISCERAL Patient Name:  Kevin Mckay  Date of Exam:   01/23/2022 Medical Rec #: [id number]        Accession #:    [id number] Date of Birth: 1951/03/08       Patient Gender: M Patient Age:   71 years Exam Location:  [facility] Procedure:      VAS [place] MESENTERIC Referring Phys: [id number] Erskine Squibb DEW -------------------------------------------------------------------------------- Indications: IMA Stenosis Vascular Interventions: 12/18/2021: Catheter placement into the IMA from Left                         Femoral approach. Aortogram and Selective Angiogram of                         the Inferior Mesenteric Artery. PTA of the IMA with 4 mm                         diameter Lutonix drug coated angioplasty balloon. Performing Technologist: Almira Coaster RVS  Examination Guidelines: A complete evaluation includes B-mode imaging, spectral Doppler, color Doppler, and power Doppler as needed of all accessible portions of each vessel. Bilateral testing is considered an integral part of a complete examination. Limited examinations for reoccurring indications may be performed as noted.  Duplex Findings: +----------------------+--------+--------+------+--------+ Mesenteric  PSV cm/sEDV cm/sPlaqueComments +----------------------+--------+--------+------+--------+ Aorta Prox               53      13                  +----------------------+--------+--------+------+--------+ Aorta Mid                66      18                  +----------------------+--------+--------+------+--------+ Aorta Distal             59      0                   +----------------------+--------+--------+------+--------+ Celiac Artery Origin    271                          +----------------------+--------+--------+------+--------+ Celiac Artery Proximal  276      50                  +----------------------+--------+--------+------+--------+ SMA Origin               281      53                  +----------------------+--------+--------+------+--------+ SMA Proximal            303      21                  +----------------------+--------+--------+------+--------+ SMA Mid                 350      38                  +----------------------+--------+--------+------+--------+ SMA Distal              303      19                  +----------------------+--------+--------+------+--------+ CHA                      61      9                   +----------------------+--------+--------+------+--------+ Splenic                  73      9                   +----------------------+--------+--------+------+--------+ IMA                     250      0                   +----------------------+--------+--------+------+--------+    Summary: Largest Aortic Diameter: 1.9 cm  Mesenteric: 70 to 99% stenosis in the celiac artery and superior mesenteric artery. The Inferior Mesenteric artery appears stenotic.  *See table(s) above for measurements and observations.  Diagnosing physician: Leotis Pain MD  Electronically signed by Leotis Pain MD on 01/23/2022 at 3:53:05 PM.    Final    VAS [place] ABI WITH/WO TBI  Result Date: 01/23/2022  LOWER EXTREMITY DOPPLER STUDY Patient Name:  Aland Chestnutt  Date of Exam:   01/23/2022 Medical Rec #: [id number]        Accession #:    [id number] Date of Birth: 10-Sep-1950  Patient Gender: M Patient Age:   38 years Exam Location:  [facility] Procedure:      VAS [place] ABI WITH/WO TBI Referring Phys: Leotis Pain --------------------------------------------------------------------------------  Indications: Claudication, rest pain, and peripheral artery disease.  Vascular Interventions: 12/18/2021: Catheter placement into the IMA from Left                         Femoral approach. Aortogram and Selective Angiogram of                         the Inferior Mesenteric Artery. PTA of the IMA with 4 mm                          diameter Lutonix drug coated angioplasty balloon. Comparison Study: 10/24/2021 Performing Technologist: Almira Coaster RVS  Examination Guidelines: A complete evaluation includes at minimum, Doppler waveform signals and systolic blood pressure reading at the level of bilateral brachial, anterior tibial, and posterior tibial arteries, when vessel segments are accessible. Bilateral testing is considered an integral part of a complete examination. Photoelectric Plethysmograph (PPG) waveforms and toe systolic pressure readings are included as required and additional duplex testing as needed. Limited examinations for reoccurring indications may be performed as noted.  ABI Findings: +---------+------------------+-----+--------+--------+ Right    Rt Pressure (mmHg)IndexWaveformComment  +---------+------------------+-----+--------+--------+ Brachial 142                                     +---------+------------------+-----+--------+--------+ ATA      109               0.77 biphasic         +---------+------------------+-----+--------+--------+ PTA      102               0.72 biphasic         +---------+------------------+-----+--------+--------+ Great Toe94                0.66 Abnormal         +---------+------------------+-----+--------+--------+ +---------+------------------+-----+---------+-------+ Left     Lt Pressure (mmHg)IndexWaveform Comment +---------+------------------+-----+---------+-------+ Brachial 142                                     +---------+------------------+-----+---------+-------+ ATA      157               1.11 triphasic        +---------+------------------+-----+---------+-------+ PTA      141               0.99 triphasic        +---------+------------------+-----+---------+-------+ Great Toe128               0.90 Normal           +---------+------------------+-----+---------+-------+  +-------+-----------+-----------+------------+------------+ ABI/TBIToday's ABIToday's TBIPrevious ABIPrevious TBI +-------+-----------+-----------+------------+------------+ Right  .77        .66        .74         .55          +-------+-----------+-----------+------------+------------+ Left   1.11       .90        .96         .83          +-------+-----------+-----------+------------+------------+  Left ABIs and TBIs appear increased compared to prior study on 10/24/2021. Right ABIs appear essentially unchanged compared to prior study on 10/24/2021. Rt TBIs appear increased compared to prior study on 10/24/2021.  Summary: Right: Resting right ankle-brachial index indicates moderate right lower extremity arterial disease. The right toe-brachial index is abnormal. Left: Resting left ankle-brachial index is within normal range. No evidence of significant left lower extremity arterial disease. The left toe-brachial index is normal. *See table(s) above for measurements and observations.  Electronically signed by Leotis Pain MD on 01/23/2022 at 3:52:32 PM.    Final     ASSESSMENT & PLAN:   Cancer of right colon [facility]) # Right colon cancer stage IV- liver metastases; adenocarcinoma; most recently on 5FU + avastin-chemotherapy currently on hold -because of mild to moderate intolerance/stability disease/patient preference.  CT scan -JUNE 9th, 2023-no evidence of progression of subcapsular liver lesion/biopsy-proven colon cancer; no evidence of new disease; however subsequent CT done for mesenteric ischemia noted to have a peripancreatic mass [ hypoenhancement involving the neck of the pancreas measuring 3.0 x 2.8 by 2.4 cm].  See below.   #Adenocarcinoma of the pancreas- [primary pancreatic CA versus metastatic colon cancer]- Abdomen MRI -July 1st, 2023-~3 cm mass.  Possibly symptomatic given his abdominal pain [also history of mesenteric ischemia].  Discussed the option of systemic chemotherapy-however  patient a poor candidate for systemic chemotherapy at this time given his syncopal episodes/neuro apathy etc.  #Poorly controlled blood pressures-currently on Amlodpine 5 mg; lisinopril 20-HCTZ; stable.  #Syncopal episodes-unclear etiology.  As per the family patient's blood sugars around 490 at the time of the episodes.  Denies any seizure-like activity.  Defer to PCP for blood sugar management.  Recent MRI of the brain negative for any acute process.  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 STABLE  #PAD/Abdominal pain-likely secondary to mesenteric ischemia-status post PTCA balloon angioplasty; continue aspirin and Plavix as per vascular [s/p EUS on 7/13-re-start in 3 days]-overall improved/STABLE.  Refill pain medication.  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   # Back pain- MRI lumbar spine- July 2021-NEG for mets; severe arthritic changes; spinal canal stenosis-stable hydrocodone 1-2 day prn - STABLE.   # Poorly controlled diabetes blood sugars-better controlled as per patients-overall stable FBG-288-continue close follow-up with PCP-  STABLE  # Active smoker: 2-3 packs cigarettes a day [rolls]-recommend quitting smoking.  Continues to smoke.    # Given the multiple malignancies- incurable nature of the disease /poor tolerance of therapy and in general less than 6 months of life expectancy-I introduced hospice philosophy to the patient and family.  Discussed that goal of care should be directed to symptom management rather than treating the underlying disease; and in the process help improve quality of life rather than quantity.  Discussed with hospice team would include-nurse, nurse aide, social worker and chaplain for help take care of patient with physical/emotional needs. Declines  referral to hospice at this  time. Might consider hospice at next visit.   # DISPOSITION:   # de-access  # follow up in 1 months; labs/port - cbc/cmp; Ca-19-9; CEA-Dr.B  Cc; [place]-     All questions were answered. The patient knows to call the clinic with any problems, questions or concerns.    Cammie Sickle, MD 02/08/2022 11:12 PM

## 2022-02-08 NOTE — Progress Notes (Signed)
Noticing lots of Dizziness, hot flashes, and vertigo.Pain in stomach getting worse; wife has to make him eat; noticing lots of dozing off wife states he will forget something after 5 minutes of talking about it. Pt sleeps alot throughout the day and wife states its becoming hard on her to take care of pt and work due to working 12 hours a day.

## 2022-02-09 ENCOUNTER — Other Ambulatory Visit: Payer: Self-pay

## 2022-02-09 LAB — CEA: CEA: 15.9 ng/mL — ABNORMAL HIGH (ref 0.0–4.7)

## 2022-02-09 LAB — CANCER ANTIGEN 19-9: CA 19-9: 344 U/mL — ABNORMAL HIGH (ref 0–35)

## 2022-02-13 ENCOUNTER — Telehealth: Payer: Self-pay | Admitting: *Deleted

## 2022-02-13 ENCOUNTER — Encounter: Payer: Self-pay | Admitting: Medical Oncology

## 2022-02-13 ENCOUNTER — Inpatient Hospital Stay: Payer: BC Managed Care – PPO

## 2022-02-13 ENCOUNTER — Inpatient Hospital Stay (HOSPITAL_BASED_OUTPATIENT_CLINIC_OR_DEPARTMENT_OTHER): Payer: BC Managed Care – PPO | Admitting: Medical Oncology

## 2022-02-13 VITALS — BP 153/71 | HR 70 | Temp 98.3°F | Wt 131.8 lb

## 2022-02-13 VITALS — BP 153/71 | HR 70 | Temp 98.3°F | Resp 18

## 2022-02-13 DIAGNOSIS — E611 Iron deficiency: Secondary | ICD-10-CM

## 2022-02-13 DIAGNOSIS — K921 Melena: Secondary | ICD-10-CM | POA: Diagnosis not present

## 2022-02-13 DIAGNOSIS — C781 Secondary malignant neoplasm of mediastinum: Secondary | ICD-10-CM

## 2022-02-13 DIAGNOSIS — E860 Dehydration: Secondary | ICD-10-CM

## 2022-02-13 DIAGNOSIS — D649 Anemia, unspecified: Secondary | ICD-10-CM

## 2022-02-13 DIAGNOSIS — C182 Malignant neoplasm of ascending colon: Secondary | ICD-10-CM

## 2022-02-13 DIAGNOSIS — R5383 Other fatigue: Secondary | ICD-10-CM

## 2022-02-13 DIAGNOSIS — R630 Anorexia: Secondary | ICD-10-CM

## 2022-02-13 DIAGNOSIS — K8689 Other specified diseases of pancreas: Secondary | ICD-10-CM | POA: Diagnosis not present

## 2022-02-13 DIAGNOSIS — C801 Malignant (primary) neoplasm, unspecified: Secondary | ICD-10-CM

## 2022-02-13 LAB — CBC WITH DIFFERENTIAL/PLATELET
Abs Immature Granulocytes: 0.02 10*3/uL (ref 0.00–0.07)
Basophils Absolute: 0 10*3/uL (ref 0.0–0.1)
Basophils Relative: 1 %
Eosinophils Absolute: 0.4 10*3/uL (ref 0.0–0.5)
Eosinophils Relative: 7 %
HCT: 34.3 % — ABNORMAL LOW (ref 39.0–52.0)
Hemoglobin: 11.5 g/dL — ABNORMAL LOW (ref 13.0–17.0)
Immature Granulocytes: 0 %
Lymphocytes Relative: 25 %
Lymphs Abs: 1.4 10*3/uL (ref 0.7–4.0)
MCH: 28.5 pg (ref 26.0–34.0)
MCHC: 33.5 g/dL (ref 30.0–36.0)
MCV: 84.9 fL (ref 80.0–100.0)
Monocytes Absolute: 0.4 10*3/uL (ref 0.1–1.0)
Monocytes Relative: 7 %
Neutro Abs: 3.4 10*3/uL (ref 1.7–7.7)
Neutrophils Relative %: 60 %
Platelets: 153 10*3/uL (ref 150–400)
RBC: 4.04 MIL/uL — ABNORMAL LOW (ref 4.22–5.81)
RDW: 14.7 % (ref 11.5–15.5)
WBC: 5.7 10*3/uL (ref 4.0–10.5)
nRBC: 0 % (ref 0.0–0.2)

## 2022-02-13 LAB — PROTIME-INR
INR: 1 (ref 0.8–1.2)
Prothrombin Time: 13.1 seconds (ref 11.4–15.2)

## 2022-02-13 LAB — COMPREHENSIVE METABOLIC PANEL
ALT: 36 U/L (ref 0–44)
AST: 33 U/L (ref 15–41)
Albumin: 3.8 g/dL (ref 3.5–5.0)
Alkaline Phosphatase: 85 U/L (ref 38–126)
Anion gap: 7 (ref 5–15)
BUN: 16 mg/dL (ref 8–23)
CO2: 25 mmol/L (ref 22–32)
Calcium: 9.1 mg/dL (ref 8.9–10.3)
Chloride: 103 mmol/L (ref 98–111)
Creatinine, Ser: 0.86 mg/dL (ref 0.61–1.24)
GFR, Estimated: >60 mL/min (ref >60)
Glucose, Bld: 190 mg/dL — ABNORMAL HIGH (ref 70–99)
Potassium: 4.5 mmol/L (ref 3.5–5.1)
Sodium: 135 mmol/L (ref 135–145)
Total Bilirubin: 0.6 mg/dL (ref 0.3–1.2)
Total Protein: 7.2 g/dL (ref 6.5–8.1)

## 2022-02-13 LAB — SAMPLE TO BLOOD BANK

## 2022-02-13 LAB — APTT: aPTT: 88 seconds — ABNORMAL HIGH (ref 24–36)

## 2022-02-13 LAB — MAGNESIUM: Magnesium: 1.6 mg/dL — ABNORMAL LOW (ref 1.7–2.4)

## 2022-02-13 MED ORDER — SODIUM CHLORIDE 0.9 % IV SOLN
Freq: Once | INTRAVENOUS | Status: AC | PRN
  Filled 2022-02-13: qty 250

## 2022-02-13 MED ORDER — HEPARIN SOD (PORK) LOCK FLUSH 100 UNIT/ML IV SOLN
500.0000 [IU] | Freq: Once | INTRAVENOUS | Status: AC | PRN
  Administered 2022-02-13: 500 [IU]
  Filled 2022-02-13: qty 5

## 2022-02-13 MED ORDER — PREDNISONE 5 MG PO TABS: 5.0000 mg | ORAL_TABLET | Freq: Every day | ORAL | 0 refills | Status: AC

## 2022-02-13 MED ORDER — SODIUM CHLORIDE 0.9 % IV SOLN
INTRAVENOUS | Status: AC
  Filled 2022-02-13: qty 250

## 2022-02-13 MED ORDER — SODIUM CHLORIDE 0.9% FLUSH
10.0000 mL | Freq: Once | INTRAVENOUS | Status: AC | PRN
  Administered 2022-02-13: 10 mL
  Filled 2022-02-13: qty 10

## 2022-02-13 MED ORDER — MAGNESIUM SULFATE 2 GM/50ML IV SOLN
2.0000 g | Freq: Once | INTRAVENOUS | Status: AC
  Administered 2022-02-13: 2 g via INTRAVENOUS
  Filled 2022-02-13: qty 50

## 2022-02-13 NOTE — Telephone Encounter (Signed)
Per Dr. Jacinto Reap- cbc/metc/ pt/ ptt/ hold tube

## 2022-02-13 NOTE — Telephone Encounter (Signed)
Wife called reporting that day before yesterday patient had a large amt of bright red blood in toilet. He has not had another bowel movement since then. She is asking if that is normal which I told her no. She denies any abdominal distention or dizziness. He is weaker and she cannot tell if he is pale due to being out in sun all day and being tanned. She also reports that he is bleeding easier than usual and it is taking longer to get it stopped . She said that he is always hitting himself on things and that he leaves a trail of blood on walls and floors where is is walking. Please advise

## 2022-02-13 NOTE — Progress Notes (Signed)
Patient states he had bloody stool on Saturday.  No stools since them.  Complains of increased fatigued.

## 2022-02-13 NOTE — Telephone Encounter (Signed)
spoke with wife. pt has not really eaten anything in 2 days. no appetite. Hematochezia episode occurred on Sunday after coming back from the beach.  wife reports that pt has intermittent nausea.  no fevers. last bm on Sunday.  Apt given for port lab/smc/infusion at 115 today.

## 2022-02-13 NOTE — Progress Notes (Signed)
[facility] [place] [facility] Telephone:([phone]) [phone] [phone]) [phone]  Patient Care Team: [name], PA as PCP - General (Physician Assistant) Telford Nab, RN as Registered Nurse Clent Jacks, RN as Oncology Nurse Navigator Cammie Sickle, MD as Consulting Physician (Oncology)   Name of the patient: Kevin Mckay  [id number]  April 04, 1951   Date of visit: 02/13/22  Reason for Consult: Kevin Mckay is a 71 y.o. male who presents today for:  Fatigue: Patient presents with his wife.  According to patient on Saturday he had 1 episode of bright red blood bloody stool.  He reports that this was a single episode and has not recurred.  He reports that in general he has had more fatigue over the past few days and has lost his appetite.  Denies any vomiting, nausea, constipation or diarrhea.  He has been able to tolerate water well but has had about 1 boost per day plus or minus additional food.  He reports that yesterday morning he had ham, and egg and grits and then had a milkshake but has not had anything since.  He continues to have right upper quadrant pain which she has had for the past 4 to 5 months and is rated as stable 4-5 out of 10 in nature.  Their concern is that when he eats items such as a milkshake his blood sugar does raise up into the 300 level.  Denies any neurologic complaints. Denies recent fevers or illnesses. Denies chest pain. Denies any nausea, vomiting, constipation, or diarrhea. Denies urinary complaints. Patient offers no further specific complaints today.    PAST MEDICAL HISTORY: Past Medical History:  Diagnosis Date   Asthma    Cancer ([place])    Metastatic small cell lung cancer   Cerebral aneurysm    Chronic painful diabetic neuropathy ([place])    Depression    Diabetes mellitus without complication ([place])    Essential hypertension    History of kidney stones    Occasional tremors    Tobacco  use     PAST SURGICAL HISTORY:  Past Surgical History:  Procedure Laterality Date   ABDOMINAL SURGERY     age 61. trauma surgery due to forklift injury- liver and spleen   COLONOSCOPY WITH PROPOFOL N/A 03/27/2019   Procedure: COLONOSCOPY WITH PROPOFOL;  Surgeon: Jonathon Bellows, MD;  Location: [facility] ENDOSCOPY;  Service: Gastroenterology;  Laterality: N/A;   COLONOSCOPY WITH PROPOFOL N/A 12/02/2021   Procedure: COLONOSCOPY WITH PROPOFOL;  Surgeon: Lesly Rubenstein, MD;  Location: [facility] ENDOSCOPY;  Service: Endoscopy;  Laterality: N/A;   COLOSTOMY REVISION Right 05/04/2019   Procedure: COLON RESECTION RIGHT-right hemicolectomy-open;  Surgeon: Fredirick Maudlin, MD;  Location: [facility] ORS;  Service: General;  Laterality: Right;   ESOPHAGOGASTRODUODENOSCOPY N/A 12/02/2021   Procedure: ESOPHAGOGASTRODUODENOSCOPY (EGD);  Surgeon: Lesly Rubenstein, MD;  Location: [facility] ENDOSCOPY;  Service: Endoscopy;  Laterality: N/A;   ESOPHAGOGASTRODUODENOSCOPY (EGD) WITH PROPOFOL N/A 03/27/2019   Procedure: ESOPHAGOGASTRODUODENOSCOPY (EGD) WITH PROPOFOL;  Surgeon: Jonathon Bellows, MD;  Location: [facility] ENDOSCOPY;  Service: Gastroenterology;  Laterality: N/A;   EUS N/A 01/04/2022   Procedure: UPPER ENDOSCOPIC ULTRASOUND (EUS) LINEAR;  Surgeon: Holly Bodily, MD;  Location: [facility] ENDOSCOPY;  Service: Gastroenterology;  Laterality: N/A;  [facility]   IR GENERIC HISTORICAL  05/31/2016   IR ANGIO VERTEBRAL SEL VERTEBRAL BILAT MOD SED 05/31/2016 Consuella Lose, MD [facility]   IR GENERIC HISTORICAL  05/31/2016   IR ANGIO INTRA EXTRACRAN SEL INTERNAL CAROTID  BILAT MOD SED 05/31/2016 Consuella Lose, MD [facility]   PORTACATH PLACEMENT Right 02/04/2019   Procedure: INSERTION PORT-A-CATH;  Surgeon: [name], MD;  Location: [facility] ORS;  Service: General;  Laterality: Right;   THORACOTOMY Left 01/22/2019   Procedure: PRE OP BRONCH LEFT ANTERIOR THORACOTOMY WITH BIOPSY OF MEDIASTINAL MASS;  Surgeon: [name], MD;   Location: [facility] ORS;  Service: General;  Laterality: Left;   VISCERAL ANGIOGRAPHY N/A 12/18/2021   Procedure: VISCERAL ANGIOGRAPHY;  Surgeon: Algernon Huxley, MD;  Location: [place] CV LAB;  Service: Cardiovascular;  Laterality: N/A;    HEMATOLOGY/ONCOLOGY HISTORY:  Oncology History Overview Note  # July 2020- SMALL CELL CA METASTATIC TO MEDIASTINAL LN [open Bx; Dr.Oaks]; TxN2M0; July 2nd 2020-PET- 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  MRI brain negative  # aug 17th 2020-carbo etoposide-RT [? 8/19]; #4 cycle carbo-Etop [finished dec 30th,2020]  #August 2020 iron deficient anemia-question etiology; IV Feraheme [no colonoscopy]  # OCT 2020- colo/ Cecal adeno ca; MRI liver- 1 cm enhancing lesion "metastasis" [Dr.Anna/Dr.Cannon]-difficulty biopsy.  November 02-2019 right hemicolectomy- STAGE III [pT3pN1 (2/13LN)]- HOLD adjuvant therapy. MMR- INTACT/LOW  #May 2021-liver biopsy-possible adenocarcinoma; colorectal origin.  Stage IV colon cancer  #May 24-2021-FOLFOX with Avastin; AUG 31st,2021- STABLE liver lesion; SEP 1st, 2021- Discontinue LV+Ox sec to severe fatigue/PN; cont 5FU CIV + Avastin  #Incidental pancreatic mass -December 18, 2021 PET scan:  Vague mild hypermetabolism (max SUV 3.0) in the pancreatic neck.  Abdomen MRI with and without contrast- July 1st, 2023-approximately 3 cm mass in the neck of the pancreas highly concerning for malignancy. JULY 2023-[EUS- Dr.Burbridge]-positive for adenocarcinoma.  Again discussed the differential diagnosis  malignant [metastatic disease from colon cancer versus primary pancreatic cancer].    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 ([place])  01/29/2019 Initial Diagnosis   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 [facility])   02/09/2019 - 06/24/2019 Chemotherapy   The patient had palonosetron (ALOXI) injection 0.25 mg, 0.25 mg, Intravenous,  Once, 4 of 4 cycles Administration: 0.25 mg (02/09/2019), 0.25 mg (03/03/2019), 0.25 mg (06/02/2019), 0.25 mg (06/22/2019) CARBOplatin (PARAPLATIN) 410 mg in sodium chloride 0.9 % 250 mL chemo infusion, 410 mg (100 % of original dose 414 mg), Intravenous,  Once, 4 of 4 cycles Dose modification:   (original dose 414 mg, Cycle 1) Administration: 410 mg (02/09/2019), 410 mg (03/03/2019), 410 mg (06/02/2019), 410 mg (06/22/2019) etoposide (VEPESID) 180 mg in sodium chloride 0.9 % 500 mL chemo infusion, 100 mg/m2 = 180 mg, Intravenous,  Once, 4 of 4 cycles Administration: 180 mg (02/09/2019), 180 mg (02/10/2019), 180 mg (02/11/2019), 180 mg (03/03/2019), 180 mg (03/04/2019), 180 mg (03/05/2019), 180 mg (06/02/2019), 180 mg (06/03/2019), 180 mg (06/04/2019), 180 mg (06/22/2019), 180 mg (06/23/2019), 180 mg (06/24/2019) fosaprepitant (EMEND) 150 mg, dexamethasone (DECADRON) 6 mg in sodium chloride 0.9 % 145 mL IVPB, , Intravenous,  Once, 2 of 2 cycles Administration:  (06/02/2019),  (06/22/2019)  for chemotherapy treatment.  Cancer of right colon ([place])  05/04/2019 Initial Diagnosis   Cancer of ascending colon ([place])   11/30/2019 -  Chemotherapy    Patient is on Treatment Plan: COLORECTAL FOLFOX + BEVACIZUMAB Q14D        ALLERGIES:  is allergic to ferumoxytol, amoxicillin, and codeine.  MEDICATIONS:  Current Outpatient Medications  Medication Sig Dispense Refill    amLODipine (NORVASC) 5 MG tablet Take 1 tablet (5 mg total) by mouth daily. 30 tablet 1   aspirin EC 81 MG tablet Take 1 tablet (81 mg total) by mouth daily. Swallow whole. 150 tablet 2   atorvastatin (LIPITOR) 10 MG tablet Take 1 tablet (10 mg total) by mouth daily. 30 tablet 11   budesonide-formoterol (SYMBICORT) 160-4.5 MCG/ACT inhaler Inhale 2 puffs into the lungs 2 (two) times daily.     DULoxetine (CYMBALTA) 30 MG capsule Take 90 mg by mouth every morning.     gabapentin (NEURONTIN) 800 MG tablet Take 800 mg by mouth 3 (three) times daily.     HYDROcodone-acetaminophen (NORCO/VICODIN) 5-325 MG tablet Take 1 tablet by mouth every 12 (twelve) hours as needed for moderate pain. 60 tablet 0   insulin glargine (LANTUS) 100 UNIT/ML injection Inject 10 Units into the skin 2 (two) times daily. Morning and Evening if Blood Sugar is over 150     lidocaine-prilocaine (EMLA) cream Apply 1 application. topically as needed. 30-45 mins prior to port access. 30 g 0   lisinopril-hydrochlorothiazide (ZESTORETIC) 20-12.5 MG tablet Take 1 tablet by mouth daily. 30 tablet 1   metFORMIN (GLUCOPHAGE) 1000 MG tablet Take 500 mg by mouth. 2 in the a.m, 1 at noon, 1 at p.m     prazosin (MINIPRESS) 1 MG capsule Take 1 mg by mouth at bedtime.     predniSONE (DELTASONE) 5 MG tablet Take 1 tablet (5 mg total) by mouth daily with breakfast for 14 days. 14 tablet 0   pyridOXINE (VITAMIN B-6) 100 MG tablet Take 100 mg by mouth daily.     sitaGLIPtin (JANUVIA) 25 MG tablet Take 25 mg by mouth daily.     sodium chloride 1 g tablet Take 1 g by mouth 3 (three) times daily with meals.     No current facility-administered medications for this visit.   Facility-Administered Medications Ordered in Other Visits  Medication Dose Route Frequency Provider Last Rate Last Admin   0.9 %  sodium chloride infusion   Intravenous Continuous Talisa Petrak M, PA-C       heparin lock flush 100 UNIT/ML injection            heparin lock  flush 100 UNIT/ML injection            sodium chloride flush (NS) 0.9 % injection 10 mL  10 mL Intravenous PRN Cammie Sickle, MD   10 mL at 09/13/20 0943   sodium chloride flush (NS) 0.9 % injection 10 mL  10 mL Intravenous PRN Cammie Sickle, MD   10 mL at 07/13/21 0948    VITAL SIGNS: BP (!) 153/71 (BP Location: Right Arm, Patient Position: Sitting)   Pulse 70   Temp 98.3 F (36.8 C) (Tympanic)   Wt 131 lb 12.8 oz (59.8 kg)   SpO2 100%   BMI 20.04 kg/m  Filed Weights   02/13/22 1336  Weight: 131 lb 12.8 oz (59.8 kg)    Estimated body mass index is 20.04 kg/m as calculated from the following:   Height as of 01/11/22: '5\' 8"'  (1.727 m).  Weight as of this encounter: 131 lb 12.8 oz (59.8 kg).  LABS: CBC:    Component Value Date/Time   WBC 5.7 02/13/2022 1320   HGB 11.5 (L) 02/13/2022 1320   HGB 14.7 10/15/2011 1319   HCT 34.3 (L) 02/13/2022 1320   HCT 42.9 10/15/2011 1319   PLT 153 02/13/2022 1320   PLT 178 10/15/2011 1319   MCV 84.9 02/13/2022 1320   MCV 90 10/15/2011 1319   NEUTROABS 3.4 02/13/2022 1320   LYMPHSABS 1.4 02/13/2022 1320   MONOABS 0.4 02/13/2022 1320   EOSABS 0.4 02/13/2022 1320   BASOSABS 0.0 02/13/2022 1320   Comprehensive Metabolic Panel:    Component Value Date/Time   NA 135 02/13/2022 1320   K 4.5 02/13/2022 1320   CL 103 02/13/2022 1320   CO2 25 02/13/2022 1320   BUN 16 02/13/2022 1320   CREATININE 0.86 02/13/2022 1320   GLUCOSE 190 (H) 02/13/2022 1320   CALCIUM 9.1 02/13/2022 1320   AST 33 02/13/2022 1320   ALT 36 02/13/2022 1320   ALKPHOS 85 02/13/2022 1320   BILITOT 0.6 02/13/2022 1320   PROT 7.2 02/13/2022 1320   ALBUMIN 3.8 02/13/2022 1320    RADIOGRAPHIC STUDIES: DG Chest 2 View  Result Date: 01/23/2022 CLINICAL DATA:  Cough and congestion for several months, history of pancreatic and colon carcinoma EXAM: CHEST - 2 VIEW COMPARISON:  12/14/2021 PET-CT FINDINGS: Cardiac shadow is within normal limits. Right chest  wall port is noted. The lungs are well aerated bilaterally. Architectural distortion is again seen in the region of the left hilum similar to that seen on prior PET-CT consistent with prior radiation therapy. No focal infiltrate or effusion is seen. No bony abnormality is noted. IMPRESSION: Post radiation changes in the left hilar region. No acute abnormality noted. Electronically Signed   By: Inez Catalina M.D.   On: 01/23/2022 20:11   VAS [place] MESENTERIC  Result Date: 01/23/2022 ABDOMINAL VISCERAL Patient Name:  AIREN STIEHL  Date of Exam:   01/23/2022 Medical Rec #: [id number]        Accession #:    [id number] Date of Birth: 1951/01/23       Patient Gender: M Patient Age:   52 years Exam Location:  [facility] Procedure:      VAS [place] MESENTERIC Referring Phys: [id number] Erskine Squibb DEW -------------------------------------------------------------------------------- Indications: IMA Stenosis Vascular Interventions: 12/18/2021: Catheter placement into the IMA from Left                         Femoral approach. Aortogram and Selective Angiogram of                         the Inferior Mesenteric Artery. PTA of the IMA with 4 mm                         diameter Lutonix drug coated angioplasty balloon. Performing Technologist: Almira Coaster RVS  Examination Guidelines: A complete evaluation includes B-mode imaging, spectral Doppler, color Doppler, and power Doppler as needed of all accessible portions of each vessel. Bilateral testing is considered an integral part of a complete examination. Limited examinations for reoccurring indications may be performed as noted.  Duplex Findings: +----------------------+--------+--------+------+--------+ Mesenteric            PSV cm/sEDV cm/sPlaqueComments +----------------------+--------+--------+------+--------+ Aorta Prox  53      13                  +----------------------+--------+--------+------+--------+ Aorta Mid                66      18                   +----------------------+--------+--------+------+--------+ Aorta Distal             59      0                   +----------------------+--------+--------+------+--------+ Celiac Artery Origin    271                          +----------------------+--------+--------+------+--------+ Celiac Artery Proximal  276      50                  +----------------------+--------+--------+------+--------+ SMA Origin              281      53                  +----------------------+--------+--------+------+--------+ SMA Proximal            303      21                  +----------------------+--------+--------+------+--------+ SMA Mid                 350      38                  +----------------------+--------+--------+------+--------+ SMA Distal              303      19                  +----------------------+--------+--------+------+--------+ CHA                      61      9                   +----------------------+--------+--------+------+--------+ Splenic                  73      9                   +----------------------+--------+--------+------+--------+ IMA                     250      0                   +----------------------+--------+--------+------+--------+    Summary: Largest Aortic Diameter: 1.9 cm  Mesenteric: 70 to 99% stenosis in the celiac artery and superior mesenteric artery. The Inferior Mesenteric artery appears stenotic.  *See table(s) above for measurements and observations.  Diagnosing physician: Leotis Pain MD  Electronically signed by Leotis Pain MD on 01/23/2022 at 3:53:05 PM.    Final    VAS [place] ABI WITH/WO TBI  Result Date: 01/23/2022  LOWER EXTREMITY DOPPLER STUDY Patient Name:  Matteo Banke  Date of Exam:   01/23/2022 Medical Rec #: [id number]        Accession #:    [id number] Date of Birth: [birth year]       Patient Gender: M Patient Age:   74 years Exam Location:  [facility] Procedure:      VAS [place] ABI  WITH/WO TBI Referring Phys: Leotis Pain --------------------------------------------------------------------------------  Indications: Claudication, rest pain, and peripheral artery disease.  Vascular Interventions: 12/18/2021: Catheter placement into the IMA from Left                         Femoral approach. Aortogram and Selective Angiogram of                         the Inferior Mesenteric Artery. PTA of the IMA with 4 mm                         diameter Lutonix drug coated angioplasty balloon. Comparison Study: 10/24/2021 Performing Technologist: Almira Coaster RVS  Examination Guidelines: A complete evaluation includes at minimum, Doppler waveform signals and systolic blood pressure reading at the level of bilateral brachial, anterior tibial, and posterior tibial arteries, when vessel segments are accessible. Bilateral testing is considered an integral part of a complete examination. Photoelectric Plethysmograph (PPG) waveforms and toe systolic pressure readings are included as required and additional duplex testing as needed. Limited examinations for reoccurring indications may be performed as noted.  ABI Findings: +---------+------------------+-----+--------+--------+ Right    Rt Pressure (mmHg)IndexWaveformComment  +---------+------------------+-----+--------+--------+ Brachial 142                                     +---------+------------------+-----+--------+--------+ ATA      109               0.77 biphasic         +---------+------------------+-----+--------+--------+ PTA      102               0.72 biphasic         +---------+------------------+-----+--------+--------+ Great Toe94                0.66 Abnormal         +---------+------------------+-----+--------+--------+ +---------+------------------+-----+---------+-------+ Left     Lt Pressure (mmHg)IndexWaveform Comment +---------+------------------+-----+---------+-------+ Brachial 142                                      +---------+------------------+-----+---------+-------+ ATA      157               1.11 triphasic        +---------+------------------+-----+---------+-------+ PTA      141               0.99 triphasic        +---------+------------------+-----+---------+-------+ Great Toe128               0.90 Normal           +---------+------------------+-----+---------+-------+ +-------+-----------+-----------+------------+------------+ ABI/TBIToday's ABIToday's TBIPrevious ABIPrevious TBI +-------+-----------+-----------+------------+------------+ Right  .77        .66        .74         .55          +-------+-----------+-----------+------------+------------+ Left   1.11       .90        .96         .83          +-------+-----------+-----------+------------+------------+  Left ABIs and TBIs appear increased compared to prior study on 10/24/2021. Right ABIs  appear essentially unchanged compared to prior study on 10/24/2021. Rt TBIs appear increased compared to prior study on 10/24/2021.  Summary: Right: Resting right ankle-brachial index indicates moderate right lower extremity arterial disease. The right toe-brachial index is abnormal. Left: Resting left ankle-brachial index is within normal range. No evidence of significant left lower extremity arterial disease. The left toe-brachial index is normal. *See table(s) above for measurements and observations.  Electronically signed by Leotis Pain MD on 01/23/2022 at 3:52:32 PM.    Final     PERFORMANCE STATUS (ECOG) : 1 - Symptomatic but completely ambulatory  Review of Systems Unless otherwise noted, a complete review of systems is negative.  Physical Exam General: NAD Cardiovascular: regular rate and rhythm Pulmonary: clear ant fields Abdomen: soft, nontender, + bowel sounds GU: no suprapubic tenderness Extremities: no edema, no joint deformities Skin: no rashes Neurological: Weakness but otherwise nonfocal  Assessment  and Plan- Patient is a 71 y.o. male  Encounter Diagnoses  Name Primary?   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  We discussed his symptoms and his goals of care.  At this time he wants to consider palliative care to help with his pain and lack of appetite.  He is agreeable to trial a very low-dose steroid to see if this will help with his fatigue and appetite.  We discussed that he should take this first thing in the morning with a meal or a protein drink to avoid GI upset.  He will need to monitor his sugars as this steroid can raise his blood sugar levels.  We discussed any blood sugar above 500 that is not able to be easily controlled which should be evaluated in the emergency room.  Continue hydration.  1 L IVF fluid and magnesium administered today.  Patient reported to feel much improved after the hydration.  Overall labs look stable to improved from previous.  In terms of his bloody bowel movement we need to watch this closely should this recur he will go to the ER.    Patient expressed understanding and was in agreement with this plan. He also understands that He can call clinic at any time with any questions, concerns, or complaints.   Thank you for allowing me to participate in the care of this very pleasant patient.   Time Total: 25  Visit consisted of counseling and education dealing with the complex and emotionally intense issues of symptom management in the setting of serious illness.Greater than 50%  of this time was spent counseling and coordinating care related to the above assessment and plan.  Signed by: Nelwyn Salisbury, PA-C

## 2022-02-23 ENCOUNTER — Other Ambulatory Visit: Payer: Self-pay

## 2022-03-07 ENCOUNTER — Encounter (INDEPENDENT_AMBULATORY_CARE_PROVIDER_SITE_OTHER): Payer: Self-pay | Admitting: *Deleted

## 2022-03-08 ENCOUNTER — Other Ambulatory Visit: Payer: Self-pay

## 2022-03-09 ENCOUNTER — Other Ambulatory Visit: Payer: BC Managed Care – PPO

## 2022-03-09 ENCOUNTER — Ambulatory Visit: Payer: BC Managed Care – PPO

## 2022-03-09 ENCOUNTER — Other Ambulatory Visit: Payer: Self-pay

## 2022-03-09 ENCOUNTER — Ambulatory Visit: Payer: BC Managed Care – PPO | Admitting: Internal Medicine

## 2022-03-14 ENCOUNTER — Other Ambulatory Visit: Payer: Self-pay

## 2022-03-14 MED FILL — Dexamethasone Sodium Phosphate Inj 100 MG/10ML: INTRAMUSCULAR | Qty: 1 | Status: AC

## 2022-03-15 ENCOUNTER — Ambulatory Visit: Payer: BC Managed Care – PPO | Admitting: Internal Medicine

## 2022-03-15 ENCOUNTER — Inpatient Hospital Stay: Payer: BC Managed Care – PPO | Admitting: Nurse Practitioner

## 2022-03-15 ENCOUNTER — Inpatient Hospital Stay: Payer: BC Managed Care – PPO | Attending: Internal Medicine

## 2022-03-15 ENCOUNTER — Other Ambulatory Visit: Payer: Self-pay

## 2022-03-15 ENCOUNTER — Other Ambulatory Visit: Payer: Self-pay | Admitting: *Deleted

## 2022-03-15 VITALS — BP 173/65 | HR 63 | Temp 97.7°F | Resp 18

## 2022-03-15 DIAGNOSIS — C787 Secondary malignant neoplasm of liver and intrahepatic bile duct: Secondary | ICD-10-CM | POA: Diagnosis not present

## 2022-03-15 DIAGNOSIS — Z79899 Other long term (current) drug therapy: Secondary | ICD-10-CM | POA: Insufficient documentation

## 2022-03-15 DIAGNOSIS — Z7902 Long term (current) use of antithrombotics/antiplatelets: Secondary | ICD-10-CM | POA: Insufficient documentation

## 2022-03-15 DIAGNOSIS — C250 Malignant neoplasm of head of pancreas: Secondary | ICD-10-CM | POA: Diagnosis not present

## 2022-03-15 DIAGNOSIS — Z7984 Long term (current) use of oral hypoglycemic drugs: Secondary | ICD-10-CM | POA: Diagnosis not present

## 2022-03-15 DIAGNOSIS — Z923 Personal history of irradiation: Secondary | ICD-10-CM | POA: Diagnosis not present

## 2022-03-15 DIAGNOSIS — Z9221 Personal history of antineoplastic chemotherapy: Secondary | ICD-10-CM | POA: Diagnosis not present

## 2022-03-15 DIAGNOSIS — G893 Neoplasm related pain (acute) (chronic): Secondary | ICD-10-CM

## 2022-03-15 DIAGNOSIS — C801 Malignant (primary) neoplasm, unspecified: Secondary | ICD-10-CM

## 2022-03-15 DIAGNOSIS — Z7189 Other specified counseling: Secondary | ICD-10-CM

## 2022-03-15 DIAGNOSIS — C182 Malignant neoplasm of ascending colon: Secondary | ICD-10-CM

## 2022-03-15 DIAGNOSIS — K831 Obstruction of bile duct: Secondary | ICD-10-CM

## 2022-03-15 DIAGNOSIS — R55 Syncope and collapse: Secondary | ICD-10-CM | POA: Insufficient documentation

## 2022-03-15 DIAGNOSIS — Z7982 Long term (current) use of aspirin: Secondary | ICD-10-CM | POA: Insufficient documentation

## 2022-03-15 DIAGNOSIS — Z794 Long term (current) use of insulin: Secondary | ICD-10-CM | POA: Insufficient documentation

## 2022-03-15 DIAGNOSIS — R970 Elevated carcinoembryonic antigen [CEA]: Secondary | ICD-10-CM | POA: Insufficient documentation

## 2022-03-15 DIAGNOSIS — F17210 Nicotine dependence, cigarettes, uncomplicated: Secondary | ICD-10-CM | POA: Insufficient documentation

## 2022-03-15 DIAGNOSIS — Z95828 Presence of other vascular implants and grafts: Secondary | ICD-10-CM

## 2022-03-15 DIAGNOSIS — D649 Anemia, unspecified: Secondary | ICD-10-CM | POA: Diagnosis not present

## 2022-03-15 LAB — CBC WITH DIFFERENTIAL/PLATELET
Abs Immature Granulocytes: 0.03 10*3/uL (ref 0.00–0.07)
Basophils Absolute: 0.1 10*3/uL (ref 0.0–0.1)
Basophils Relative: 1 %
Eosinophils Absolute: 0.3 10*3/uL (ref 0.0–0.5)
Eosinophils Relative: 5 %
HCT: 33.8 % — ABNORMAL LOW (ref 39.0–52.0)
Hemoglobin: 11.4 g/dL — ABNORMAL LOW (ref 13.0–17.0)
Immature Granulocytes: 0 %
Lymphocytes Relative: 17 %
Lymphs Abs: 1.1 10*3/uL (ref 0.7–4.0)
MCH: 28.1 pg (ref 26.0–34.0)
MCHC: 33.7 g/dL (ref 30.0–36.0)
MCV: 83.3 fL (ref 80.0–100.0)
Monocytes Absolute: 0.5 10*3/uL (ref 0.1–1.0)
Monocytes Relative: 8 %
Neutro Abs: 4.7 10*3/uL (ref 1.7–7.7)
Neutrophils Relative %: 69 %
Platelets: 178 10*3/uL (ref 150–400)
RBC: 4.06 MIL/uL — ABNORMAL LOW (ref 4.22–5.81)
RDW: 14.9 % (ref 11.5–15.5)
WBC: 6.8 10*3/uL (ref 4.0–10.5)
nRBC: 0 % (ref 0.0–0.2)

## 2022-03-15 LAB — COMPREHENSIVE METABOLIC PANEL
ALT: 337 U/L — ABNORMAL HIGH (ref 0–44)
AST: 266 U/L — ABNORMAL HIGH (ref 15–41)
Albumin: 3.7 g/dL (ref 3.5–5.0)
Alkaline Phosphatase: 491 U/L — ABNORMAL HIGH (ref 38–126)
Anion gap: 11 (ref 5–15)
BUN: 15 mg/dL (ref 8–23)
CO2: 24 mmol/L (ref 22–32)
Calcium: 9.3 mg/dL (ref 8.9–10.3)
Chloride: 98 mmol/L (ref 98–111)
Creatinine, Ser: 1.22 mg/dL (ref 0.61–1.24)
GFR, Estimated: >60 mL/min (ref >60)
Glucose, Bld: 263 mg/dL — ABNORMAL HIGH (ref 70–99)
Potassium: 3.4 mmol/L — ABNORMAL LOW (ref 3.5–5.1)
Sodium: 133 mmol/L — ABNORMAL LOW (ref 135–145)
Total Bilirubin: 7.6 mg/dL — ABNORMAL HIGH (ref 0.3–1.2)
Total Protein: 7.6 g/dL (ref 6.5–8.1)

## 2022-03-15 LAB — URINALYSIS, COMPLETE (UACMP) WITH MICROSCOPIC
Glucose, UA: 250 mg/dL — AB
Ketones, ur: 15 mg/dL — AB
Leukocytes,Ua: NEGATIVE
Nitrite: NEGATIVE
Protein, ur: >300 mg/dL — AB
Specific Gravity, Urine: >1.03 — ABNORMAL HIGH (ref 1.005–1.030)
pH: 5 (ref 5.0–8.0)

## 2022-03-15 MED ORDER — MORPHINE SULFATE ER 15 MG PO TBCR: 15.0000 mg | EXTENDED_RELEASE_TABLET | Freq: Two times a day (BID) | ORAL | 0 refills | Status: DC

## 2022-03-15 MED ORDER — LEVOFLOXACIN 250 MG PO TABS: 250.0000 mg | ORAL_TABLET | Freq: Every day | ORAL | 0 refills | Status: AC

## 2022-03-15 MED ORDER — HEPARIN SOD (PORK) LOCK FLUSH 100 UNIT/ML IV SOLN
500.0000 [IU] | Freq: Once | INTRAVENOUS | Status: AC
  Administered 2022-03-15: 500 [IU] via INTRAVENOUS
  Filled 2022-03-15: qty 5

## 2022-03-15 MED ORDER — SODIUM CHLORIDE 0.9% FLUSH
10.0000 mL | Freq: Once | INTRAVENOUS | Status: AC
  Administered 2022-03-15: 10 mL via INTRAVENOUS
  Filled 2022-03-15: qty 10

## 2022-03-15 MED ORDER — PREDNISONE 10 MG PO TABS: 10.0000 mg | ORAL_TABLET | Freq: Every day | ORAL | 0 refills | Status: AC

## 2022-03-15 MED ORDER — OXYCODONE HCL 5 MG PO TABS: 5.0000 mg | ORAL_TABLET | Freq: Three times a day (TID) | ORAL | 0 refills | Status: DC | PRN

## 2022-03-15 NOTE — Progress Notes (Signed)
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  MRI brain negative  # aug 17th 2020-carbo etoposide-RT [? 8/19]; #4 cycle carbo-Etop [finished dec 30th,2020]  #August 2020 iron deficient anemia-question etiology; IV Feraheme [no colonoscopy]  # OCT 2020- colo/ Cecal adeno ca; MRI liver- 1 cm enhancing lesion "metastasis" [Dr.Anna/Dr.Cannon]-difficulty biopsy.  November 02-2019 right hemicolectomy- STAGE III [pT3pN1 (2/13LN)]- HOLD adjuvant therapy. MMR- INTACT/LOW  #May 2021-liver biopsy-possible adenocarcinoma; colorectal origin.  Stage IV colon cancer  #May 24-2021-FOLFOX with Avastin; AUG 31st,2021- STABLE liver lesion; SEP 1st, 2021- Discontinue LV+Ox sec to severe fatigue/PN; cont 5FU CIV + Avastin  #Incidental pancreatic mass -December 18, 2021 PET scan:  Vague mild hypermetabolism (max SUV 3.0) in the pancreatic neck.  Abdomen MRI with and without contrast- July 1st, 2023-approximately 3 cm mass in the neck of the pancreas highly concerning for malignancy. JULY 2023-[EUS- Dr.Burbridge]-positive for adenocarcinoma.  Again discussed the differential diagnosis  malignant [metastatic disease from colon cancer versus primary pancreatic cancer].    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 ([place])  01/29/2019 Initial Diagnosis   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 [facility])   02/09/2019 - 06/24/2019 Chemotherapy   The patient had palonosetron (ALOXI) injection 0.25 mg, 0.25 mg, Intravenous,  Once, 4 of 4 cycles Administration: 0.25 mg (02/09/2019), 0.25 mg (03/03/2019), 0.25 mg (06/02/2019), 0.25 mg (06/22/2019) CARBOplatin (PARAPLATIN) 410 mg in sodium chloride 0.9 % 250 mL chemo infusion, 410 mg (100 % of original dose 414 mg), Intravenous,  Once, 4 of 4 cycles Dose modification:   (original dose 414 mg, Cycle 1) Administration: 410 mg (02/09/2019), 410 mg (03/03/2019), 410 mg (06/02/2019), 410 mg (06/22/2019) etoposide (VEPESID) 180 mg in sodium chloride 0.9 % 500 mL chemo infusion, 100 mg/m2 = 180 mg, Intravenous,  Once, 4 of 4 cycles Administration: 180 mg (02/09/2019), 180 mg (02/10/2019), 180 mg (02/11/2019), 180 mg (03/03/2019), 180 mg (03/04/2019), 180 mg (03/05/2019), 180 mg (06/02/2019), 180 mg (06/03/2019), 180 mg (06/04/2019), 180 mg (06/22/2019), 180 mg (06/23/2019), 180 mg (06/24/2019) fosaprepitant (EMEND) 150 mg, dexamethasone (DECADRON) 6 mg in sodium chloride 0.9 % 145 mL IVPB, , Intravenous,  Once, 2 of 2 cycles Administration:  (06/02/2019),  (06/22/2019)  for chemotherapy treatment.    Cancer of right colon ([place])  05/04/2019 Initial Diagnosis   Cancer of ascending colon ([place])   11/30/2019 -  Chemotherapy    Patient is on Treatment Plan: COLORECTAL FOLFOX + BEVACIZUMAB Q14D       HISTORY OF PRESENTING ILLNESS: Accompanied by his wife.  Ambulating  independently. [facility]  [facility] 71 y.o.  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 He feels poorly. Continues to have loss of appetite and worsening abdominal pain. Rates pain 5/10. Has been taking 2 hydrocodone 2-3 times a day. Reports diarrhea. No nausea or vomiting. Localizes pain to upper abdomen. Reports darker/yellower skin tone which wife felt was related to being in the sun. Sleeps a lot. More fatigued. Felt better with prednisone and requests refill. Ongoing dizzy spells and passing out.   Review of Systems  Constitutional:  Positive for malaise/fatigue and weight loss. Negative for chills, diaphoresis and fever.  HENT:  Negative for nosebleeds and sore throat.   Eyes:  Negative for double vision.  Respiratory:  Negative for hemoptysis and wheezing.   Cardiovascular:  Negative for chest pain, palpitations and orthopnea.  Gastrointestinal:  Positive for abdominal pain and diarrhea. Negative for blood in stool, constipation, heartburn, melena, nausea and vomiting.  Genitourinary:  Positive for dysuria and frequency. Negative for urgency.  Musculoskeletal:  Positive for back pain and joint pain.  Skin: Negative.  Negative for itching and rash.  Neurological:  Positive for tingling, tremors and weakness. Negative for dizziness, focal weakness and headaches.  Endo/Heme/Allergies:  Does not bruise/bleed easily.  Psychiatric/Behavioral:  Negative for depression. The patient is not nervous/anxious and does not have insomnia.      MEDICAL HISTORY:  Past Medical History:  Diagnosis Date   Asthma    Cancer ([place])    Metastatic small cell lung cancer   Cerebral aneurysm    Chronic painful diabetic neuropathy (HCC)    Depression    Diabetes  mellitus without complication ([place])    Essential hypertension    History of kidney stones    Occasional tremors    Tobacco use     SURGICAL HISTORY: Past Surgical History:  Procedure Laterality Date   ABDOMINAL SURGERY     age 19. trauma surgery due to forklift injury- liver and spleen   COLONOSCOPY WITH PROPOFOL N/A 03/27/2019   Procedure: COLONOSCOPY WITH PROPOFOL;  Surgeon: Jonathon Bellows, MD;  Location: [facility] ENDOSCOPY;  Service: Gastroenterology;  Laterality: N/A;   COLONOSCOPY WITH PROPOFOL N/A 12/02/2021   Procedure: COLONOSCOPY WITH PROPOFOL;  Surgeon: Lesly Rubenstein, MD;  Location: [facility] ENDOSCOPY;  Service: Endoscopy;  Laterality: N/A;   COLOSTOMY REVISION Right 05/04/2019   Procedure: COLON RESECTION RIGHT-right hemicolectomy-open;  Surgeon: Fredirick Maudlin, MD;  Location: [facility] ORS;  Service: General;  Laterality: Right;   ESOPHAGOGASTRODUODENOSCOPY N/A 12/02/2021   Procedure: ESOPHAGOGASTRODUODENOSCOPY (EGD);  Surgeon: Lesly Rubenstein, MD;  Location: [facility] ENDOSCOPY;  Service: Endoscopy;  Laterality: N/A;   ESOPHAGOGASTRODUODENOSCOPY (EGD) WITH PROPOFOL N/A 03/27/2019   Procedure: ESOPHAGOGASTRODUODENOSCOPY (EGD) WITH PROPOFOL;  Surgeon: Jonathon Bellows, MD;  Location: [facility] ENDOSCOPY;  Service: Gastroenterology;  Laterality: N/A;   EUS N/A 01/04/2022   Procedure: UPPER ENDOSCOPIC ULTRASOUND (EUS) LINEAR;  Surgeon: Holly Bodily, MD;  Location: [facility] ENDOSCOPY;  Service: Gastroenterology;  Laterality: N/A;  [facility]   IR GENERIC HISTORICAL  05/31/2016   IR ANGIO VERTEBRAL SEL VERTEBRAL BILAT MOD SED 05/31/2016 Consuella Lose, MD [facility]   IR GENERIC HISTORICAL  05/31/2016   IR ANGIO INTRA EXTRACRAN SEL INTERNAL CAROTID BILAT MOD SED 05/31/2016 Consuella Lose, MD [facility]  PORTACATH PLACEMENT Right 02/04/2019   Procedure: INSERTION PORT-A-CATH;  Surgeon: [name], MD;  Location: [facility] ORS;  Service: General;  Laterality: Right;   THORACOTOMY Left 01/22/2019    Procedure: PRE OP BRONCH LEFT ANTERIOR THORACOTOMY WITH BIOPSY OF MEDIASTINAL MASS;  Surgeon: [name], MD;  Location: [facility] ORS;  Service: General;  Laterality: Left;   VISCERAL ANGIOGRAPHY N/A 12/18/2021   Procedure: VISCERAL ANGIOGRAPHY;  Surgeon: Algernon Huxley, MD;  Location: [facility] LAB;  Service: Cardiovascular;  Laterality: N/A;    SOCIAL HISTORY: Social History   Socioeconomic History   Marital status: Married    Spouse name: Not on file   Number of children: Not on file   Years of education: Not on file   Highest education level: Not on file  Occupational History   Not on file  Tobacco Use   Smoking status: Every Day    Packs/day: 0.50    Years: 50.00    Total pack years: 25.00    Types: Cigarettes   Smokeless tobacco: Never  Vaping Use   Vaping Use: Never used  Substance and Sexual Activity   Alcohol use: No   Drug use: Yes    Types: Barbituates, Marijuana    Comment: yesterday   Sexual activity: Not Currently  Other Topics Concern   Not on file  Social History Narrative   Lives in [place]; wife/ son/grandson [custody]; smoker; vending business; hx of alcoholism- quit at 56.    Social Determinants of Health   Financial Resource Strain: Low Risk  (05/04/2019)   Overall Financial Resource Strain (CARDIA)    Difficulty of Paying Living Expenses: Not hard at all  Food Insecurity: No Food Insecurity (05/04/2019)   Hunger Vital Sign    Worried About Running Out of Food in the Last Year: Never true    Ran Out of Food in the Last Year: Never true  Transportation Needs: No Transportation Needs (05/04/2019)   PRAPARE - Hydrologist (Medical): No    Lack of Transportation (Non-Medical): No  Physical Activity: Inactive (05/04/2019)   Exercise Vital Sign    Days of Exercise per Week: 0 days    Minutes of Exercise per Session: 0 min  Stress: No Stress Concern Present (05/04/2019)   [place]    Feeling of Stress : Not at all  Social Connections: Unknown (05/04/2019)   Social Connection and Isolation Panel [NHANES]    Frequency of Communication with Friends and Family: Patient refused    Frequency of Social Gatherings with Friends and Family: Patient refused    Attends Religious Services: Patient refused    Active Member of Clubs or Organizations: Patient refused    Attends Archivist Meetings: Patient refused    Marital Status: Patient refused  Intimate Partner Violence: Not At Risk (05/04/2019)   Humiliation, Afraid, Rape, and Kick questionnaire    Fear of Current or Ex-Partner: No    Emotionally Abused: No    Physically Abused: No    Sexually Abused: No    FAMILY HISTORY: Family History  Problem Relation Age of Onset   Lymphoma Father    Liver cancer Paternal Uncle    Lung cancer Paternal Uncle    Lung cancer Maternal Uncle     ALLERGIES:  is allergic to ferumoxytol, amoxicillin, and codeine.  MEDICATIONS:  Current Outpatient Medications  Medication Sig Dispense Refill   amLODipine (NORVASC) 5 MG tablet Take  1 tablet (5 mg total) by mouth daily. 30 tablet 1   aspirin EC 81 MG tablet Take 1 tablet (81 mg total) by mouth daily. Swallow whole. 150 tablet 2   atorvastatin (LIPITOR) 10 MG tablet Take 1 tablet (10 mg total) by mouth daily. 30 tablet 11   budesonide-formoterol (SYMBICORT) 160-4.5 MCG/ACT inhaler Inhale 2 puffs into the lungs 2 (two) times daily.     DULoxetine (CYMBALTA) 30 MG capsule Take 90 mg by mouth every morning.     gabapentin (NEURONTIN) 800 MG tablet Take 800 mg by mouth 3 (three) times daily.     HYDROcodone-acetaminophen (NORCO/VICODIN) 5-325 MG tablet Take 1 tablet by mouth every 12 (twelve) hours as needed for moderate pain. 60 tablet 0   insulin glargine (LANTUS) 100 UNIT/ML injection Inject 10 Units into the skin 2 (two) times daily. Morning and Evening if Blood Sugar is over 150      lidocaine-prilocaine (EMLA) cream Apply 1 application. topically as needed. 30-45 mins prior to port access. 30 g 0   lisinopril-hydrochlorothiazide (ZESTORETIC) 20-12.5 MG tablet Take 1 tablet by mouth daily. 30 tablet 1   metFORMIN (GLUCOPHAGE) 1000 MG tablet Take 500 mg by mouth. 2 in the a.m, 1 at noon, 1 at p.m     prazosin (MINIPRESS) 1 MG capsule Take 1 mg by mouth at bedtime.     pyridOXINE (VITAMIN B-6) 100 MG tablet Take 100 mg by mouth daily.     sitaGLIPtin (JANUVIA) 25 MG tablet Take 25 mg by mouth daily.     sodium chloride 1 g tablet Take 1 g by mouth 3 (three) times daily with meals.     clopidogrel (PLAVIX) 75 MG tablet Take 75 mg by mouth daily.     No current facility-administered medications for this visit.   Facility-Administered Medications Ordered in Other Visits  Medication Dose Route Frequency Provider Last Rate Last Admin   0.9 %  sodium chloride infusion   Intravenous Continuous [place], Sarah M, PA-C       heparin lock flush 100 UNIT/ML injection            heparin lock flush 100 UNIT/ML injection            sodium chloride flush (NS) 0.9 % injection 10 mL  10 mL Intravenous PRN Charlaine Dalton R, MD   10 mL at 09/13/20 0943   sodium chloride flush (NS) 0.9 % injection 10 mL  10 mL Intravenous PRN Cammie Sickle, MD   10 mL at 07/13/21 0948    PHYSICAL EXAMINATION: ECOG PERFORMANCE STATUS: 3 - Symptomatic, >50% confined to bed  Vitals:   03/15/22 1103  BP: (!) 173/65  Pulse: 63  Resp: 18  Temp: 97.7 F (36.5 C)  SpO2: 100%  There were no vitals filed for this visit.  Physical Exam Constitutional:      Appearance: He is ill-appearing.     Comments: Ambulates without aids. Accompanied by wife Hilda Blades.   Eyes:     General: Scleral icterus present.  Cardiovascular:     Rate and Rhythm: Normal rate and regular rhythm.  Pulmonary:     Breath sounds: No wheezing or rales.  Abdominal:     General: There is distension.     Tenderness: There  is abdominal tenderness. There is no guarding.  Musculoskeletal:        General: No deformity.     Right lower leg: No edema.     Left lower leg: No edema.  Lymphadenopathy:     Cervical: No cervical adenopathy.  Skin:    General: Skin is warm and dry.     Coloration: Skin is jaundiced.  Neurological:     Mental Status: He is alert and oriented to person, place, and time.     Comments: Tremor of hands  Psychiatric:        Mood and Affect: Mood normal.        Behavior: Behavior normal.     LABORATORY DATA:  I have reviewed the data as listed Lab Results  Component Value Date   WBC 6.8 03/15/2022   HGB 11.4 (L) 03/15/2022   HCT 33.8 (L) 03/15/2022   MCV 83.3 03/15/2022   PLT 178 03/15/2022   Recent Labs    02/08/22 0910 02/13/22 1320 03/15/22 1042  NA 137 135 133*  K 4.6 4.5 3.4*  CL 101 103 98  CO2 _0 GLUCOSE 256* 190* 263*  BUN _1 CREATININE 1.28* 0.86 1.22  CALCIUM 9.1 9.1 9.3  GFRNONAA >60 >60 >60  PROT 6.7 7.2 7.6  ALBUMIN 3.8 3.8 3.7  AST 22 33 266*  ALT 18 36 337*  ALKPHOS 68 85 491*  BILITOT 0.4 0.6 7.6*   Urinalysis    Component Value Date/Time   COLORURINE AMBER (A) 03/15/2022 1113   APPEARANCEUR CLOUDY (A) 03/15/2022 1113   LABSPEC >1.030 (H) 03/15/2022 1113   PHURINE 5.0 03/15/2022 1113   GLUCOSEU 250 (A) 03/15/2022 1113   HGBUR TRACE (A) 03/15/2022 1113   BILIRUBINUR LARGE (A) 03/15/2022 1113   KETONESUR 15 (A) 03/15/2022 1113   PROTEINUR >300 (A) 03/15/2022 1113   NITRITE NEGATIVE 03/15/2022 1113   LEUKOCYTESUR NEGATIVE 03/15/2022 1113    RADIOGRAPHIC STUDIES: I have personally reviewed the radiological images as listed and agreed with the findings in the report. No results found.  ASSESSMENT & PLAN:  Right: Cancer-stage IV-liver metastases.  Adenocarcinoma.  Most recently on 5-FU plus cisplatin Chemotherapy which is currently on hold due to mild to moderate intolerance, stability of disease, and patient preference.  CT  scan from December 01, 2021 was negative for evidence of progression of subscapular liver lesion which was previously biopsy-proven colon cancer.  No new evidence of disease however, subsequent CT scan done for mesenteric ischemia he was noted to have peripancreatic mass involving the neck of the pancreas measuring 3.0 x 2.8 x 2.4 cm. Last chemo March 9th, 2022. CEA has been rising. Was 15.9 in August 2023. Pending today.   Adenocarcinoma of the pancreas-primary pancreatic cancer versus metastatic colon cancer.  Abdominal MRI December 23, 2021 revealed 3 cm mass.  Possibly symptomatic given his abdominal pain, also history of mesenteric ischemia.  CA 19-9 was 344 in August 2023. Pending today. He is a poor candidate for systemic chemotherapy given several syncopal episodes and neuropathy, blood sugars, performance status. Hospice was discussed but patient declined and opted for surveillance.   Elevated bilirubin and abnormal LFTs- likely related to obstructive d/t progressive pancreatic mass. Clinically patient is jaundiced with increasing pain. We discussed severity of these findings. Discussed options including ERCP for possible stenting which given that he is not a candidate for chemotherapy would be palliative. This may improve his comfort and quality of life for the short term however, there are risks to consider including pain, bleeding, infection. Reviewed that often this is done int he hospital but can consider doing outpatient as well. My preference would be to go to the hospital however  given his overall frailty and general decline. Patient declines this option and states that he does not see any reason to undergo procedures.   Pain related to malignancy- discussed options for management of pain both acutely. Offered option of evaluation with pain management for possible celiac plexus block. Discussed rationale, benefits, and risks. Patient declines at this time. Prefers to continue with oral medications.  Currently taking 2 hydrocodone every 8-12 hours. Given worsening hepatic impairment, rotate to oxycodone though high risk of accumulation. Reviewed that this medication would be for short acting pain management. Recommended addding a long acting pain medication, recommend MS Contin given his hepatic impairment. Start 15 mg q12 hours for long acting pain. Reviewed that narcotic pain medications may cause constipation, somnolence. Recommended that he not drive. Will also prescribe prednisone 20 mg daily which may help reduce swelling secondary to blockage, easing pain, improving fatigue, and appetite. Reviewed that steroids may worsen blood sugars and are given with palliative intent.   Poorly controlled blood pressures- currently on amlodipine 5 mg and lisinopril 20 mg-HCTZ. BP 173/65 today. Reports poor compliance with medications d/t fatigue, weight loss, loss of appetite.   Poorly controlled diabetes- will likely worsen with steroids though benefit may outweigh risk given overall poor prognosis.   Goals of care- reviewed that patient is nearing end of life. Reviewed that without medical intervention life expectancy is days to weeks. He expresses that he does not want to go to the hospital at this time. Would consider if he declines or becomes obtunded which is likely given his worsening liver disease, reviewed that he is high risk of hepatic encephalopathy. We discussed options for comfort based care including hospice at his home with or without transfer to the hospice home, hospital care. He declines CPR or life support. DNR form completed, scanned to chart and ACP documents completed in [place]. Original copies were provided to patient. MOST form was also completed. Patient's wife expresses concern of patient passing at home and impact this would have on herself, son, and grandson. Her preference is for patient to go to hospital should he worsen. I recommended patient allow hospice to come to their home,  meet with them, and discuss services available. Patient declines this at this time. Patient declines home palliative care. If patient agrees, would involve Josh Borders at visit on Monday. Discussed case with Billey Chang, palliative care. Also discussed with Dr. Grayland Ormond in absence of Dr. Rogue Bussing.   Dysuria & Frequency- UA today. Large amount of bilirubin and protein in urine. Reviewed that this supports likely biliary obstruction. Additionally, wbcs and clumps are present. Recommend Levaquin 250 mg by mouth once daily x 5 days.   LEFT hilar/LUL-  Limited stage small cell cancer-s/p chemo-RT; PET scan 2023-unchanged perihilar and suprahilar post treatment/post radiation fibrosis and consolidation of the left upper lobe. STABLE  Weight loss- secondary to progressive disease.   Syncopal episodes- unclear etiology. Seem to have some correlation with elevated blood sugars but question orthostasis, fluid volume deficit. Ongoing issue. Denies seizure like activity. Repeat MRI brain was negative for acute processes.      PAD/Abdominal pain-likely secondary to mesenteric ischemia-status post PTCA balloon angioplasty; continue aspirin and Plavix as per vascular [s/p EUS on 7/13-re-start in 3 days]-overall improved/STABLE.    Anemia- secondary to chronic disease. Stable.     Back pain- MRI lumbar spine- July 2021-NEG for mets; severe arthritic changes; spinal canal stenosis-stable     Active smoker: 2-3 packs cigarettes a day [rolls]-recommend quitting  smoking.  Continues to smoke.      DISPOSITION:  RTC on Monday for evaluation with Dr. Rogue Bussing. ER precautions reviewed.   No problem-specific Assessment & Plan notes found for this encounter.  All questions were answered. The patient knows to call the clinic with any problems, questions or concerns.   Verlon Au, NP 03/15/2022 2:00 PM  CC: Dr. Rogue Bussing

## 2022-03-15 NOTE — Telephone Encounter (Signed)
[facility] not [facility] Morphine, they can fill the Prednisone an dthe Oxycodone 5 mg but the MS has to be sent elsewhere. I called and spoke with patient s/o who said to send MS to [facility] [place] and leave the Oxy and Prednisone at [facility]

## 2022-03-16 LAB — CEA: CEA: 21.2 ng/mL — ABNORMAL HIGH (ref 0.0–4.7)

## 2022-03-16 LAB — CANCER ANTIGEN 19-9: CA 19-9: 443 U/mL — ABNORMAL HIGH (ref 0–35)

## 2022-03-19 ENCOUNTER — Inpatient Hospital Stay: Payer: BC Managed Care – PPO | Admitting: Internal Medicine

## 2022-03-19 ENCOUNTER — Encounter: Payer: Self-pay | Admitting: Internal Medicine

## 2022-03-19 DIAGNOSIS — C250 Malignant neoplasm of head of pancreas: Secondary | ICD-10-CM | POA: Diagnosis not present

## 2022-03-19 DIAGNOSIS — C182 Malignant neoplasm of ascending colon: Secondary | ICD-10-CM | POA: Diagnosis not present

## 2022-03-19 NOTE — Assessment & Plan Note (Addendum)
#   Adenocarcinoma of the pancreas- [primary pancreatic CA versus metastatic colon cancer]- Abdomen MRI -July 1st, 2023-~3 cm mass. symptomatic given his abdominal pain [also history of mesenteric ischemia]-now presenting with obstructive jaundice.  Progressive disease; rising cancer marker.  Discussed the option of systemic chemotherapy-however patient a poor candidate for systemic chemotherapy at this time given his syncopal episodes/neuro apathy etc.  #Obstructive jaundice-likely secondary to adenocarcinoma the pancreas; again reviewed the possible interventions including further imaging like MRCP/ERCP stenting-needing hospitalization.  Patient declines any further work-up.  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  CT scan -JUNE 9th, 2023-no evidence of progression of subcapsular liver lesion/biopsy-proven colon cancer; no evidence of new disease; however subsequent CT done for mesenteric ischemia noted to have a peripancreatic mass [ hypoenhancement involving the neck of the pancreas measuring 3.0 x 2.8 by 2.4 cm].  See below.   #Poorly controlled blood pressures-currently on Amlodpine 5 mg; lisinopril 20-HCTZ; STABLE  #Syncopal episodes-unclear etiology.  As per the family patient's blood sugars around 490 at the time of the episodes.  Denies any seizure-like activity.See below.  # LEFT hilar/LUL-  Limited stage small cell cancer-s/p chemo-RT; PET scan 2023-unchanged perihilar and suprahilar post treatment/post radiation fibrosis and consolidation of the left upper lobe.stable; see below.   #PAD/Abdominal pain-likely secondary to mesenteric ischemia-status post PTCA balloon angioplasty; continue aspirin and Plavix as per vascular [s/p EUS on 7/13-re-start in 3 days]-overall improved/STABLE.  Refill pain medication.  # Poorly  controlled diabetes blood sugars-better controlled as per patients-overall stable FBG-288-continue close follow-up with PCP-  STABLE  #I again reviewed at length with the patient and his wife that given the multiple malignancies- incurable nature of the disease /poor tolerance of therapy and in general less than 6 months of life expectancy.  I again introduced the hospice philosophy to the patient and family.  Discussed that goal of care should be directed to symptom management rather than treating the underlying disease; and in the process help improve quality of life rather than quantity.  Discussed with hospice team would include-nurse, nurse aide, social worker and chaplain for help take care of patient with physical/emotional needs.  The patient finally agreed to hospice evaluation.  Hospice referral made.  # DISPOSITION:  # referral to hospice re: Pancreatic cancer/colon cancer/lung cancer # follow up in 1 month- MD;  labs/port - cbc/cmp; Ca-19-9; [facility]-Dr.B  # 40 minutes face-to-face with the patient discussing the above plan of care; more than 50% of time spent on prognosis/ natural history; counseling and coordination.   Cc; [facility]-

## 2022-03-19 NOTE — Progress Notes (Signed)
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  MRI brain negative  # aug 17th 2020-carbo etoposide-RT [? 8/19]; #4 cycle carbo-Etop [finished dec 30th,2020]  #August 2020 iron deficient anemia-question etiology; IV Feraheme [no colonoscopy]  # OCT 2020- colo/ Cecal adeno ca; MRI liver- 1 cm enhancing lesion "metastasis" [Dr.Anna/Dr.Cannon]-difficulty biopsy.  November 02-2019 right hemicolectomy- STAGE III [pT3pN1 (2/13LN)]- HOLD adjuvant therapy. MMR- INTACT/LOW  #May 2021-liver biopsy-possible adenocarcinoma; colorectal origin.  Stage IV colon cancer  #May 24-2021-FOLFOX with Avastin; AUG 31st,2021- STABLE liver lesion; SEP 1st, 2021- Discontinue LV+Ox sec to severe fatigue/PN; cont 5FU CIV + Avastin  #Incidental pancreatic mass -December 18, 2021 PET scan:  Vague mild hypermetabolism (max SUV 3.0) in the pancreatic neck.  Abdomen MRI with and without contrast- July 1st, 2023-approximately 3 cm mass in the neck of the pancreas highly concerning for malignancy. JULY 2023-[EUS- Dr.Burbridge]-positive for adenocarcinoma.  Again discussed the differential diagnosis  malignant [metastatic disease from colon cancer versus primary pancreatic cancer].    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 ([place])  01/29/2019 Initial Diagnosis   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 [facility])   02/09/2019 - 06/24/2019 Chemotherapy   The patient had palonosetron (ALOXI) injection 0.25 mg, 0.25 mg, Intravenous,  Once, 4 of 4 cycles Administration: 0.25 mg (02/09/2019), 0.25 mg (03/03/2019), 0.25 mg (06/02/2019), 0.25 mg (06/22/2019) CARBOplatin (PARAPLATIN) 410 mg in sodium chloride 0.9 % 250 mL chemo infusion, 410 mg (100 % of original dose 414 mg), Intravenous,  Once, 4 of 4 cycles Dose modification:   (original dose 414 mg, Cycle 1) Administration: 410 mg (02/09/2019), 410 mg (03/03/2019), 410 mg (06/02/2019), 410 mg (06/22/2019) etoposide (VEPESID) 180 mg in sodium chloride 0.9 % 500 mL chemo infusion, 100 mg/m2 = 180 mg, Intravenous,  Once, 4 of 4 cycles Administration: 180 mg (02/09/2019), 180 mg (02/10/2019), 180 mg (02/11/2019), 180 mg (03/03/2019), 180 mg (03/04/2019), 180 mg (03/05/2019), 180 mg (06/02/2019), 180 mg (06/03/2019), 180 mg (06/04/2019), 180 mg (06/22/2019), 180 mg (06/23/2019), 180 mg (06/24/2019) fosaprepitant (EMEND) 150 mg, dexamethasone (DECADRON) 6 mg in sodium chloride 0.9 % 145 mL IVPB, , Intravenous,  Once, 2 of 2 cycles Administration:  (06/02/2019),  (06/22/2019)  for chemotherapy treatment.    Cancer of right colon ([place])  05/04/2019 Initial Diagnosis   Cancer of ascending colon ([place])   11/30/2019 -  Chemotherapy    Patient is on Treatment Plan: COLORECTAL FOLFOX + BEVACIZUMAB Q14D       HISTORY OF PRESENTING ILLNESS: Accompanied by his wife.  Ambulating  independently.  Kevin Mckay 71 y.o.  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  In the interim patient was evaluated in the clinic-to have obstructive jaundice.  Concerning for progressive pancreatic cancer.   Patient declined hospitalization-for further evaluation/possible ERCP. at the last visit patient was started on antibiotics for possible bladder infection.  And also given a prescription of prednisone for poor appetite weight loss.  Also prescribed oxycodone for ongoing pain. Patient did not get his prescription for oxycodone filled.  Continues to have abdominal pain needing hydrocodone.  Pain not well controlled.  As per wife patient is also more fatigue.  Patient has been sleeping a lot.    Review of Systems  Constitutional:  Positive for malaise/fatigue and weight loss. Negative for chills, diaphoresis and fever.  HENT:  Negative for nosebleeds and sore throat.   Eyes:  Negative for double vision.  Respiratory:  Negative for hemoptysis and wheezing.   Cardiovascular:  Negative for chest pain, palpitations and orthopnea.  Gastrointestinal:  Negative for abdominal pain, blood in stool, constipation, diarrhea, heartburn, melena, nausea and vomiting.  Genitourinary:  Negative for dysuria, frequency and urgency.  Musculoskeletal:  Positive for back pain and joint pain.  Skin: Negative.  Negative for itching and rash.  Neurological:  Positive for tingling. Negative for dizziness, focal weakness and weakness.  Endo/Heme/Allergies:  Does not bruise/bleed easily.  Psychiatric/Behavioral:  Negative for depression. The patient is not nervous/anxious and does not have insomnia.      MEDICAL HISTORY:  Past Medical History:  Diagnosis Date   Asthma    Cancer ([place])     Metastatic small cell lung cancer   Cerebral aneurysm    Chronic painful diabetic neuropathy ([facility])    Depression    Diabetes mellitus without complication ([place])    Essential hypertension    History of kidney stones    Occasional tremors    Tobacco use     SURGICAL HISTORY: Past Surgical History:  Procedure Laterality Date   ABDOMINAL SURGERY     age 69. trauma surgery due to forklift injury- liver and spleen   COLONOSCOPY WITH PROPOFOL N/A 03/27/2019   Procedure: COLONOSCOPY WITH PROPOFOL;  Surgeon: Jonathon Bellows, MD;  Location: [facility] ENDOSCOPY;  Service: Gastroenterology;  Laterality: N/A;   COLONOSCOPY WITH PROPOFOL N/A 12/02/2021   Procedure: COLONOSCOPY WITH PROPOFOL;  Surgeon: Lesly Rubenstein, MD;  Location: [facility] ENDOSCOPY;  Service: Endoscopy;  Laterality: N/A;   COLOSTOMY REVISION Right 05/04/2019   Procedure: COLON RESECTION RIGHT-right hemicolectomy-open;  Surgeon: Fredirick Maudlin, MD;  Location: [facility] ORS;  Service: General;  Laterality: Right;   ESOPHAGOGASTRODUODENOSCOPY N/A 12/02/2021   Procedure: ESOPHAGOGASTRODUODENOSCOPY (EGD);  Surgeon: Lesly Rubenstein, MD;  Location: [facility] ENDOSCOPY;  Service: Endoscopy;  Laterality: N/A;   ESOPHAGOGASTRODUODENOSCOPY (EGD) WITH PROPOFOL N/A 03/27/2019   Procedure: ESOPHAGOGASTRODUODENOSCOPY (EGD) WITH PROPOFOL;  Surgeon: Jonathon Bellows, MD;  Location: [facility] ENDOSCOPY;  Service: Gastroenterology;  Laterality: N/A;   EUS N/A 01/04/2022   Procedure: UPPER ENDOSCOPIC ULTRASOUND (EUS) LINEAR;  Surgeon: Holly Bodily, MD;  Location: [facility] ENDOSCOPY;  Service: Gastroenterology;  Laterality: N/A;  [facility]   IR GENERIC HISTORICAL  05/31/2016   IR ANGIO VERTEBRAL SEL VERTEBRAL BILAT MOD SED 05/31/2016 Consuella Lose, MD [facility]   IR GENERIC HISTORICAL  05/31/2016   IR ANGIO INTRA EXTRACRAN SEL INTERNAL CAROTID  BILAT MOD SED 05/31/2016 Consuella Lose, MD [facility]   PORTACATH PLACEMENT Right 02/04/2019   Procedure: INSERTION  PORT-A-CATH;  Surgeon: [name], MD;  Location: [facility] ORS;  Service: General;  Laterality: Right;   THORACOTOMY Left 01/22/2019   Procedure: PRE OP BRONCH LEFT ANTERIOR THORACOTOMY WITH BIOPSY OF MEDIASTINAL MASS;  Surgeon: [name], MD;  Location: [facility] ORS;  Service: General;  Laterality: Left;   VISCERAL ANGIOGRAPHY N/A 12/18/2021   Procedure: VISCERAL ANGIOGRAPHY;  Surgeon: Algernon Huxley, MD;  Location: [facility] LAB;  Service: Cardiovascular;  Laterality: N/A;    SOCIAL HISTORY: Social History   Socioeconomic History   Marital status: Married    Spouse name: Not on file   Number of children: Not on file   Years of education: Not on file   Highest education level: Not on file  Occupational History   Not on file  Tobacco Use   Smoking status: Every Day    Packs/day: 0.50    Years: 50.00    Total pack years: 25.00    Types: Cigarettes   Smokeless tobacco: Never  Vaping Use   Vaping Use: Never used  Substance and Sexual Activity   Alcohol use: No   Drug use: Yes    Types: Barbituates, Marijuana    Comment: yesterday   Sexual activity: Not Currently  Other Topics Concern   Not on file  Social History Narrative   Lives in [place]; wife/ son/grandson [custody]; smoker; vending business; hx of alcoholism- quit at 77.    Social Determinants of Health   Financial Resource Strain: Low Risk  (05/04/2019)   Overall Financial Resource Strain (CARDIA)    Difficulty of Paying Living Expenses: Not hard at all  Food Insecurity: No Food Insecurity (05/04/2019)   Hunger Vital Sign    Worried About Running Out of Food in the Last Year: Never true    Ran Out of Food in the Last Year: Never true  Transportation Needs: No Transportation Needs (05/04/2019)   PRAPARE - Hydrologist (Medical): No    Lack of Transportation (Non-Medical): No  Physical Activity: Inactive (05/04/2019)   Exercise Vital Sign    Days of Exercise per Week: 0 days     Minutes of Exercise per Session: 0 min  Stress: No Stress Concern Present (05/04/2019)   [place]    Feeling of Stress : Not at all  Social Connections: Unknown (05/04/2019)   Social Connection and Isolation Panel [NHANES]    Frequency of Communication with Friends and Family: Patient refused    Frequency of Social Gatherings with Friends and Family: Patient refused    Attends Religious Services: Patient refused    Active Member of Clubs or Organizations: Patient refused    Attends Archivist Meetings: Patient refused    Marital Status: Patient refused  Intimate Partner Violence: Not At Risk (05/04/2019)   Humiliation, Afraid, Rape, and Kick questionnaire    Fear of Current or Ex-Partner: No    Emotionally Abused: No    Physically Abused: No    Sexually Abused: No    FAMILY HISTORY: Family History  Problem Relation Age of Onset   Lymphoma Father    Liver cancer Paternal Uncle    Lung cancer Paternal Uncle    Lung cancer Maternal Uncle     ALLERGIES:  is allergic to ferumoxytol, amoxicillin, and codeine.  MEDICATIONS:  Current Outpatient Medications  Medication  Sig Dispense Refill   amLODipine (NORVASC) 5 MG tablet Take 1 tablet (5 mg total) by mouth daily. 30 tablet 1   aspirin EC 81 MG tablet Take 1 tablet (81 mg total) by mouth daily. Swallow whole. 150 tablet 2   atorvastatin (LIPITOR) 10 MG tablet Take 1 tablet (10 mg total) by mouth daily. 30 tablet 11   budesonide-formoterol (SYMBICORT) 160-4.5 MCG/ACT inhaler Inhale 2 puffs into the lungs 2 (two) times daily.     clopidogrel (PLAVIX) 75 MG tablet Take 75 mg by mouth daily.     DULoxetine (CYMBALTA) 30 MG capsule Take 90 mg by mouth every morning.     gabapentin (NEURONTIN) 800 MG tablet Take 800 mg by mouth 3 (three) times daily.     HYDROcodone-acetaminophen (NORCO/VICODIN) 5-325 MG tablet Take 1 tablet by mouth every 12 (twelve) hours as  needed for moderate pain. 60 tablet 0   insulin glargine (LANTUS) 100 UNIT/ML injection Inject 10 Units into the skin 2 (two) times daily. Morning and Evening if Blood Sugar is over 150     levofloxacin (LEVAQUIN) 250 MG tablet Take 1 tablet (250 mg total) by mouth daily for 5 days. 5 tablet 0   lidocaine-prilocaine (EMLA) cream Apply 1 application. topically as needed. 30-45 mins prior to port access. 30 g 0   lisinopril-hydrochlorothiazide (ZESTORETIC) 20-12.5 MG tablet Take 1 tablet by mouth daily. 30 tablet 1   metFORMIN (GLUCOPHAGE) 1000 MG tablet Take 500 mg by mouth. 2 in the a.m, 1 at noon, 1 at p.m     morphine (MS CONTIN) 15 MG 12 hr tablet Take 1 tablet (15 mg total) by mouth every 12 (twelve) hours. For long acting pain control 14 tablet 0   prazosin (MINIPRESS) 1 MG capsule Take 1 mg by mouth at bedtime.     predniSONE (DELTASONE) 10 MG tablet Take 1 tablet (10 mg total) by mouth daily with breakfast for 7 days. 7 tablet 0   pyridOXINE (VITAMIN B-6) 100 MG tablet Take 100 mg by mouth daily.     sitaGLIPtin (JANUVIA) 25 MG tablet Take 25 mg by mouth daily.     sodium chloride 1 g tablet Take 1 g by mouth 3 (three) times daily with meals.     oxyCODONE (OXY IR/ROXICODONE) 5 MG immediate release tablet Take 1 tablet (5 mg total) by mouth every 8 (eight) hours as needed for severe pain. For short acting pain relief (Patient not taking: Reported on 03/19/2022) 30 tablet 0   No current facility-administered medications for this visit.   Facility-Administered Medications Ordered in Other Visits  Medication Dose Route Frequency Provider Last Rate Last Admin   0.9 %  sodium chloride infusion   Intravenous Continuous C[place], Sarah M, PA-C       heparin lock flush 100 UNIT/ML injection            heparin lock flush 100 UNIT/ML injection            sodium chloride flush (NS) 0.9 % injection 10 mL  10 mL Intravenous PRN Charlaine Dalton R, MD   10 mL at 09/13/20 0943   sodium chloride  flush (NS) 0.9 % injection 10 mL  10 mL Intravenous PRN Cammie Sickle, MD   10 mL at 07/13/21 0948      .  PHYSICAL EXAMINATION: ECOG PERFORMANCE STATUS: 0 - Asymptomatic  Vitals:   03/19/22 1121  BP: (!) 166/77  Pulse: 80  Resp: 18  Temp: (!) 97 F (  36.1 C)  SpO2: 96%    Filed Weights   03/19/22 1121  Weight: 129 lb (58.5 kg)     Physical Exam HENT:     Head: Normocephalic and atraumatic.     Mouth/Throat:     Pharynx: No oropharyngeal exudate.  Eyes:     Pupils: Pupils are equal, round, and reactive to light.  Cardiovascular:     Rate and Rhythm: Normal rate and regular rhythm.  Pulmonary:     Effort: No respiratory distress.     Breath sounds: No wheezing.  Abdominal:     General: Bowel sounds are normal. There is no distension.     Palpations: Abdomen is soft. There is no mass.     Tenderness: There is no abdominal tenderness. There is no guarding or rebound.     Comments: Abdominal incision well-healed.  Musculoskeletal:        General: No tenderness. Normal range of motion.     Cervical back: Normal range of motion and neck supple.  Skin:    General: Skin is warm.  Neurological:     Mental Status: He is alert and oriented to person, place, and time.  Psychiatric:        Mood and Affect: Affect normal.     LABORATORY DATA:  I have reviewed the data as listed Lab Results  Component Value Date   WBC 6.8 03/15/2022   HGB 11.4 (L) 03/15/2022   HCT 33.8 (L) 03/15/2022   MCV 83.3 03/15/2022   PLT 178 03/15/2022   Recent Labs    02/08/22 0910 02/13/22 1320 03/15/22 1042  NA 137 135 133*  K 4.6 4.5 3.4*  CL 101 103 98  CO2 _0 GLUCOSE 256* 190* 263*  BUN _1 CREATININE 1.28* 0.86 1.22  CALCIUM 9.1 9.1 9.3  GFRNONAA >60 >60 >60  PROT 6.7 7.2 7.6  ALBUMIN 3.8 3.8 3.7  AST 22 33 266*  ALT 18 36 337*  ALKPHOS 68 85 491*  BILITOT 0.4 0.6 7.6*    RADIOGRAPHIC STUDIES: I have personally reviewed the radiological images  as listed and agreed with the findings in the report. No results found.  ASSESSMENT & PLAN:   Adenocarcinoma of head of pancreas [facility]) # Adenocarcinoma of the pancreas- [primary pancreatic CA versus metastatic colon cancer]- Abdomen MRI -July 1st, 2023-~3 cm mass. symptomatic given his abdominal pain [also history of mesenteric ischemia]-now presenting with obstructive jaundice.  Progressive disease; rising cancer marker.  Discussed the option of systemic chemotherapy-however patient a poor candidate for systemic chemotherapy at this time given his syncopal episodes/neuro apathy etc.  #Obstructive jaundice-likely secondary to adenocarcinoma the pancreas; again reviewed the possible interventions including further imaging like MRCP/ERCP stenting-needing hospitalization.  Patient declines any further work-up.  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  CT scan -JUNE 9th, 2023-no evidence of progression of subcapsular liver lesion/biopsy-proven colon cancer; no evidence of new disease; however subsequent CT done for mesenteric ischemia noted to have a peripancreatic mass [ hypoenhancement involving the neck of the pancreas measuring 3.0 x 2.8 by 2.4 cm].  See below.   #Poorly controlled blood pressures-currently on Amlodpine 5 mg; lisinopril 20-HCTZ; STABLE  #Syncopal episodes-unclear etiology.  As per the family patient's blood sugars around 490 at the time of the episodes.  Denies any seizure-like activity.See below.  # LEFT hilar/LUL-  Limited  stage small cell cancer-s/p chemo-RT; PET scan 2023-unchanged perihilar and suprahilar post treatment/post radiation fibrosis and consolidation of the left upper lobe.stable; see below.   #PAD/Abdominal pain-likely secondary to mesenteric ischemia-status post PTCA balloon angioplasty; continue  aspirin and Plavix as per vascular [s/p EUS on 7/13-re-start in 3 days]-overall improved/STABLE.  Refill pain medication.  # Poorly controlled diabetes blood sugars-better controlled as per patients-overall stable FBG-288-continue close follow-up with PCP-  STABLE  #I again reviewed at length with the patient and his wife that given the multiple malignancies- incurable nature of the disease /poor tolerance of therapy and in general less than 6 months of life expectancy.  I again introduced the hospice philosophy to the patient and family.  Discussed that goal of care should be directed to symptom management rather than treating the underlying disease; and in the process help improve quality of life rather than quantity.  Discussed with hospice team would include-nurse, nurse aide, social worker and chaplain for help take care of patient with physical/emotional needs.  The patient finally agreed to hospice evaluation.  Hospice referral made.  # DISPOSITION:  # referral to hospice re: Pancreatic cancer/colon cancer/lung cancer # follow up in 1 month- MD;  labs/port - cbc/cmp; Ca-19-9; [facility]-Dr.B  Cc; [place] [facility]-     All questions were answered. The patient knows to call the clinic with any problems, questions or concerns.    Cammie Sickle, MD 03/19/2022 12:38 PM

## 2022-03-19 NOTE — Progress Notes (Signed)
Patient here today for follow up regarding colon cancer. Patient and wife report no appetite-patient is drinking protein drinks and snacks on peanut butter crackers. Patient reports ongoing diarrhea-denies any nausea or vomiting. Patient reports neuropathy is about the same. Pain 4/10 today, did start MS Contin as prescribed last week on 9/21. Patient had prescription sent in also for Oxycodone as needed but has not picked up. Patient reports he continues to take hydrocodone for breakthrough pain but he has to take 2 tablets to be effective.

## 2022-04-02 ENCOUNTER — Other Ambulatory Visit: Payer: Self-pay | Admitting: *Deleted

## 2022-04-02 MED ORDER — OXYCODONE HCL 5 MG PO TABS: 5.0000 mg | ORAL_TABLET | Freq: Three times a day (TID) | ORAL | 0 refills | Status: AC | PRN

## 2022-04-02 MED ORDER — MORPHINE SULFATE ER 15 MG PO TBCR: 15.0000 mg | EXTENDED_RELEASE_TABLET | Freq: Two times a day (BID) | ORAL | 0 refills | Status: AC

## 2022-04-03 ENCOUNTER — Telehealth: Payer: Self-pay | Admitting: Internal Medicine

## 2022-04-03 NOTE — Telephone Encounter (Signed)
Patient's wife called and requested to cancel patient's appointment on 10/27 for [facility]/MD. She stated that patient is receiving in home hospice care, is on "heavy pain medication" and does not want to take him out for any further appointments.    Routing to clinical team to make aware.

## 2022-04-20 ENCOUNTER — Ambulatory Visit: Payer: BC Managed Care – PPO | Admitting: Internal Medicine

## 2022-04-20 ENCOUNTER — Other Ambulatory Visit: Payer: BC Managed Care – PPO

## 2022-04-25 DEATH — deceased

## 2022-07-31 ENCOUNTER — Encounter (INDEPENDENT_AMBULATORY_CARE_PROVIDER_SITE_OTHER): Payer: Medicare Other

## 2022-07-31 ENCOUNTER — Ambulatory Visit (INDEPENDENT_AMBULATORY_CARE_PROVIDER_SITE_OTHER): Payer: Medicare Other | Admitting: Vascular Surgery
